# Patient Record
Sex: Female | Born: 1955 | Race: White | Hispanic: No | Marital: Married | Smoking: Never smoker
Health system: Southern US, Community
[De-identification: ages and names within clinical notes are randomized; demographics above are authoritative.]

## PROBLEM LIST (undated history)

## (undated) DIAGNOSIS — F329 Major depressive disorder, single episode, unspecified: Secondary | ICD-10-CM

## (undated) DIAGNOSIS — M722 Plantar fascial fibromatosis: Secondary | ICD-10-CM

## (undated) DIAGNOSIS — D649 Anemia, unspecified: Secondary | ICD-10-CM

## (undated) DIAGNOSIS — F32A Depression, unspecified: Secondary | ICD-10-CM

## (undated) DIAGNOSIS — R112 Nausea with vomiting, unspecified: Secondary | ICD-10-CM

## (undated) DIAGNOSIS — C8590 Non-Hodgkin lymphoma, unspecified, unspecified site: Secondary | ICD-10-CM

## (undated) DIAGNOSIS — K274 Chronic or unspecified peptic ulcer, site unspecified, with hemorrhage: Secondary | ICD-10-CM

## (undated) DIAGNOSIS — A0472 Enterocolitis due to Clostridium difficile, not specified as recurrent: Secondary | ICD-10-CM

## (undated) DIAGNOSIS — Z9889 Other specified postprocedural states: Secondary | ICD-10-CM

## (undated) DIAGNOSIS — C50919 Malignant neoplasm of unspecified site of unspecified female breast: Secondary | ICD-10-CM

## (undated) DIAGNOSIS — T7840XA Allergy, unspecified, initial encounter: Secondary | ICD-10-CM

## (undated) DIAGNOSIS — C50412 Malignant neoplasm of upper-outer quadrant of left female breast: Secondary | ICD-10-CM

## (undated) DIAGNOSIS — K284 Chronic or unspecified gastrojejunal ulcer with hemorrhage: Secondary | ICD-10-CM

## (undated) DIAGNOSIS — N6489 Other specified disorders of breast: Secondary | ICD-10-CM

## (undated) DIAGNOSIS — Z5189 Encounter for other specified aftercare: Secondary | ICD-10-CM

## (undated) DIAGNOSIS — M810 Age-related osteoporosis without current pathological fracture: Secondary | ICD-10-CM

## (undated) DIAGNOSIS — F319 Bipolar disorder, unspecified: Secondary | ICD-10-CM

## (undated) HISTORY — PX: BREAST BIOPSY: SHX20

## (undated) HISTORY — DX: Allergy, unspecified, initial encounter: T78.40XA

## (undated) HISTORY — DX: Enterocolitis due to Clostridium difficile, not specified as recurrent: A04.72

## (undated) HISTORY — DX: Malignant neoplasm of unspecified site of unspecified female breast: C50.919

## (undated) HISTORY — DX: Age-related osteoporosis without current pathological fracture: M81.0

## (undated) HISTORY — DX: Non-Hodgkin lymphoma, unspecified, unspecified site: C85.90

## (undated) HISTORY — DX: Major depressive disorder, single episode, unspecified: F32.9

## (undated) HISTORY — PX: OTHER SURGICAL HISTORY: SHX169

## (undated) HISTORY — DX: Bipolar disorder, unspecified: F31.9

## (undated) HISTORY — DX: Malignant neoplasm of upper-outer quadrant of left female breast: C50.412

## (undated) HISTORY — PX: COLONOSCOPY: SHX174

## (undated) HISTORY — DX: Depression, unspecified: F32.A

## (undated) HISTORY — DX: Chronic or unspecified gastrojejunal ulcer with hemorrhage: K28.4

## (undated) HISTORY — DX: Other specified disorders of breast: N64.89

## (undated) HISTORY — DX: Chronic or unspecified peptic ulcer, site unspecified, with hemorrhage: K27.4

## (undated) HISTORY — PX: UPPER GASTROINTESTINAL ENDOSCOPY: SHX188

## (undated) HISTORY — DX: Encounter for other specified aftercare: Z51.89

---
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
## 2000-02-13 ENCOUNTER — Other Ambulatory Visit: Admission: RE | Admit: 2000-02-13 | Discharge: 2000-02-13 | Payer: Self-pay | Admitting: *Deleted

## 2001-05-04 ENCOUNTER — Other Ambulatory Visit: Admission: RE | Admit: 2001-05-04 | Discharge: 2001-05-04 | Payer: Self-pay | Admitting: *Deleted

## 2004-08-03 ENCOUNTER — Encounter: Admission: RE | Admit: 2004-08-03 | Discharge: 2004-08-03 | Payer: Self-pay | Admitting: *Deleted

## 2004-08-23 ENCOUNTER — Other Ambulatory Visit: Admission: RE | Admit: 2004-08-23 | Discharge: 2004-08-23 | Payer: Self-pay | Admitting: *Deleted

## 2004-10-14 DIAGNOSIS — T7840XA Allergy, unspecified, initial encounter: Secondary | ICD-10-CM

## 2004-10-14 HISTORY — DX: Allergy, unspecified, initial encounter: T78.40XA

## 2006-01-03 ENCOUNTER — Encounter: Admission: RE | Admit: 2006-01-03 | Discharge: 2006-01-03 | Payer: Self-pay | Admitting: Family Medicine

## 2006-01-20 ENCOUNTER — Encounter: Admission: RE | Admit: 2006-01-20 | Discharge: 2006-01-20 | Payer: Self-pay | Admitting: Family Medicine

## 2006-04-13 LAB — HM COLONOSCOPY: HM Colonoscopy: NORMAL

## 2006-06-09 ENCOUNTER — Other Ambulatory Visit: Admission: RE | Admit: 2006-06-09 | Discharge: 2006-06-09 | Payer: Self-pay | Admitting: Obstetrics & Gynecology

## 2007-08-09 ENCOUNTER — Emergency Department (HOSPITAL_COMMUNITY): Admission: EM | Admit: 2007-08-09 | Discharge: 2007-08-09 | Payer: Self-pay | Admitting: Family Medicine

## 2007-11-30 ENCOUNTER — Other Ambulatory Visit: Admission: RE | Admit: 2007-11-30 | Discharge: 2007-11-30 | Payer: Self-pay | Admitting: Obstetrics and Gynecology

## 2009-03-08 ENCOUNTER — Other Ambulatory Visit: Admission: RE | Admit: 2009-03-08 | Discharge: 2009-03-08 | Payer: Self-pay | Admitting: Obstetrics and Gynecology

## 2009-03-17 ENCOUNTER — Encounter: Admission: RE | Admit: 2009-03-17 | Discharge: 2009-03-17 | Payer: Self-pay | Admitting: Obstetrics and Gynecology

## 2009-03-17 LAB — HM DEXA SCAN: HM Dexa Scan: NORMAL

## 2011-06-10 LAB — HM COLONOSCOPY

## 2011-07-24 LAB — POCT URINALYSIS DIP (DEVICE)
Bilirubin Urine: NEGATIVE
Glucose, UA: NEGATIVE
Ketones, ur: NEGATIVE
Nitrite: NEGATIVE
Operator id: 270961
Protein, ur: NEGATIVE
Specific Gravity, Urine: 1.01
Urobilinogen, UA: 0.2
pH: 7.5

## 2011-07-24 LAB — URINE CULTURE: Colony Count: 50000

## 2011-07-24 LAB — POCT PREGNANCY, URINE
Operator id: 270961
Preg Test, Ur: NEGATIVE

## 2011-12-30 ENCOUNTER — Encounter (INDEPENDENT_AMBULATORY_CARE_PROVIDER_SITE_OTHER): Payer: BC Managed Care – PPO | Admitting: Ophthalmology

## 2011-12-30 DIAGNOSIS — H35369 Drusen (degenerative) of macula, unspecified eye: Secondary | ICD-10-CM

## 2011-12-30 DIAGNOSIS — H2510 Age-related nuclear cataract, unspecified eye: Secondary | ICD-10-CM

## 2011-12-30 DIAGNOSIS — H43819 Vitreous degeneration, unspecified eye: Secondary | ICD-10-CM

## 2012-10-14 HISTORY — PX: FOOT SURGERY: SHX648

## 2013-01-20 ENCOUNTER — Ambulatory Visit (INDEPENDENT_AMBULATORY_CARE_PROVIDER_SITE_OTHER): Payer: BC Managed Care – PPO | Admitting: Medical

## 2013-01-20 ENCOUNTER — Encounter: Payer: Self-pay | Admitting: Medical

## 2013-01-20 VITALS — BP 100/80 | HR 69 | Temp 98.5°F | Resp 16 | Wt 134.0 lb

## 2013-01-20 DIAGNOSIS — Z23 Encounter for immunization: Secondary | ICD-10-CM

## 2013-01-20 DIAGNOSIS — S61209A Unspecified open wound of unspecified finger without damage to nail, initial encounter: Secondary | ICD-10-CM

## 2013-01-20 DIAGNOSIS — S61219A Laceration without foreign body of unspecified finger without damage to nail, initial encounter: Secondary | ICD-10-CM

## 2013-01-20 NOTE — Progress Notes (Signed)
Subjective: Here as a new patient today.   Was seeing Dr. Cristopher Peru in [facility] prior.   Has also seen Dr. Lynelle Doctor at [facility] prior.    She is here for injury.  Sunday 3 days ago she was sharpening a clean kitchen knife when she slipped cutting her left 3rd, 4th and 5th fingers across the PIP joint on all 3 dorsally.   The ring finger was deep enough she could see either bone or tendon.  She washed the wound with soap and water.  She has been keeping the wound clean with hydrogen peroxide, rubbing alcohol, and antibacterial ointment.  She did not go for medical care that day.   She thinks her last Td was about 5 years ago but not sure.  She has had some bloody drainage from the left ringer finger, but the other wounds are healing.   She has been using butterfly bandages to help hold the wounds closed. She is right handed.  Objective:  Gen: wd, wn, nad, pleasant white female Skin there are horizontal linear laceration wounds of the 3rd, 4th, and 5th fingers dorsally along the PIP surface, the left ring finger/4th finger dorsally with 1.2cm horizontal linear wound that is open with some mild bloody drainage.  No obvious erythema, warmth, pus.    MSK: ROM of the left hand and fingers is normal, no swelling, no other wound or trauma noted Pulses: normal upper extremity pulses and cap refill Neuro: normal strength and sensation of the all fingers of the left hand and hand itself   Assessment: Encounter Diagnoses  Name Primary?  . Finger laceration, initial encounter Yes  . Need for Tdap vaccination     Plan: I asked Dr. Lynelle Doctor, supervising physician to review the case and examine patient as well.  We both agreed that patient had done a relatively good job of self treating the wounds, but should have went to the ED for sutures the first few hours.   At this point we will have her to use splint of the left 4th finger to help support tendons and soft tissue, keep the finger in  extension while the wound heals through secondary intention.  We applied steri strips to all 3 fingers today, discussed wound care, gave VIS, counseling and tdap vaccines, discussed signs that would prompt return.   Recheck 1wk.

## 2013-02-12 ENCOUNTER — Ambulatory Visit (INDEPENDENT_AMBULATORY_CARE_PROVIDER_SITE_OTHER): Payer: BC Managed Care – PPO | Admitting: Family Medicine

## 2013-02-12 ENCOUNTER — Encounter: Payer: Self-pay | Admitting: Family Medicine

## 2013-02-12 VITALS — BP 110/70 | HR 82

## 2013-02-12 DIAGNOSIS — S6992XA Unspecified injury of left wrist, hand and finger(s), initial encounter: Secondary | ICD-10-CM

## 2013-02-12 DIAGNOSIS — S6990XA Unspecified injury of unspecified wrist, hand and finger(s), initial encounter: Secondary | ICD-10-CM

## 2013-02-12 NOTE — Progress Notes (Signed)
  Subjective:    Patient ID: Rhonda Gardner, female    DOB: 04-Feb-1956, 57 y.o.   MRN: [id number]  HPI She sustained a laceration to her left hand third fourth and fifth fingers over the dorsalsurface of the PIP joints  2-3weeks ago. She treated the wound conservatively.now she is having swelling mainly in the fourth finger of the left hand.   Review of Systems     Objective:   Physical Exam Exam of the fourth finger of left hand does show swelling distal to her wedding ring with slight erythema.       Assessment & Plan:  Finger injury, left, initial encounter an attempt was made to squeeze out the excessive fluid however it was unsuccessful. A ring cutter was employed and the ring was removed without difficulty. She will take it to her jeweler to be fixed. Also encouraged her to watch the swelling which should go down for now. She will call if further trouble

## 2013-02-17 ENCOUNTER — Encounter: Payer: Self-pay | Admitting: *Deleted

## 2013-03-01 ENCOUNTER — Encounter: Payer: Self-pay | Admitting: *Deleted

## 2014-10-10 ENCOUNTER — Ambulatory Visit (INDEPENDENT_AMBULATORY_CARE_PROVIDER_SITE_OTHER): Payer: BC Managed Care – PPO | Admitting: Medical

## 2014-10-10 ENCOUNTER — Telehealth: Payer: Self-pay | Admitting: Family Medicine

## 2014-10-10 ENCOUNTER — Encounter: Payer: Self-pay | Admitting: Family Medicine

## 2014-10-10 ENCOUNTER — Encounter: Payer: Self-pay | Admitting: Medical

## 2014-10-10 VITALS — BP 98/60 | HR 68 | Temp 98.1°F | Resp 15 | Wt 144.0 lb

## 2014-10-10 DIAGNOSIS — F319 Bipolar disorder, unspecified: Secondary | ICD-10-CM

## 2014-10-10 DIAGNOSIS — F3130 Bipolar disorder, current episode depressed, mild or moderate severity, unspecified: Secondary | ICD-10-CM

## 2014-10-10 MED ORDER — LAMICTAL 200 MG PO TABS: 200.0000 mg | ORAL_TABLET | Freq: Every day | ORAL | Status: DC

## 2014-10-10 MED ORDER — LAMOTRIGINE 200 MG PO TABS: 200.0000 mg | ORAL_TABLET | Freq: Every day | ORAL | Status: DC

## 2014-10-10 NOTE — Progress Notes (Signed)
Subjective Here for refill on Lamictal.   Her prescription has run out.  [place] the [place] due to the [place].  Takes this for bipolar.  In the past saw Letta Moynahan.  Has been very stable, has been on this same medication and dose for years.   Last visit there was 06/2013.  Has been taking 100mg  daily, but gets 200mg  and cuts these in half.  Lives at home with husband [name].  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   Her older sister has disabilities.  Exercises regularly.  Just started Zumba class.  Doesn't want to go back to psych office due to costs.  Was seeing yearly.   Has been on Prozac, Neurontin, and other medication prior.   No prior hospitalization.   No prior suicidal attempts.   Has certainly had depression issues in the past.   Has manic episode once before with prozac, but remote past.  Gets along great with husband.    Objective: Gen: wd, wn, nad Psych:pleasant, good eye contact, answers questions appropriately Heart: RRR, normal S1, S2, no murmurs Pulses normal   Assessment: Encounter Diagnosis  Name Primary?  . Bipolar disorder with depression Yes    Plan: We discussed her history, stability, medications.   She sends the script international for huge costs savings . She is taking 100mg  or 1/2 tablet daily although script says 200mg  once daily.  This is how she has done the script prior.   Will request prior records from psychiatry.  Advised she return in the near future for baseline physical and primary care.  Discussed f/u.

## 2014-10-10 NOTE — Telephone Encounter (Signed)
I see her husband.  I believe I have seen her in the past, but that might have been at prior office.  I'm happy to do her CPE, but let's make sure we have records from [place].  (I was surprised to see her on Shane's schedule when I had openings today)

## 2014-10-10 NOTE — Addendum Note (Signed)
Addended by: Armanda Magic on: 10/10/2014 03:40 PM   Modules accepted: Orders

## 2014-10-10 NOTE — Telephone Encounter (Signed)
I closed the note before I sent to you.  Please see prior note

## 2014-10-10 NOTE — Telephone Encounter (Signed)
Was checking pt out and Rhonda Gardner had wanted her to schedule CPE.  She stopped me and said the CPE would be with Dr. Tomi Bamberger since you were her primary care before.  I told her I thought Rhonda Gardner was her PCP.  So I told her I would ask. Please advise.

## 2014-10-11 ENCOUNTER — Telehealth: Payer: Self-pay | Admitting: Family Medicine

## 2014-10-11 NOTE — Telephone Encounter (Signed)
Advised pt that Dr. Tomi Bamberger would take her as a pt and scheduled cpe with her.  Advised her we would need her records before cpe.

## 2014-10-11 NOTE — Telephone Encounter (Signed)
Records have been ordered from 3 different providers as of yesterday.  I will let her know you will accept her as your patient.

## 2014-12-01 ENCOUNTER — Encounter: Payer: Self-pay | Admitting: Family Medicine

## 2014-12-01 ENCOUNTER — Ambulatory Visit (INDEPENDENT_AMBULATORY_CARE_PROVIDER_SITE_OTHER): Payer: BLUE CROSS/BLUE SHIELD | Admitting: Family Medicine

## 2014-12-01 ENCOUNTER — Other Ambulatory Visit: Payer: Self-pay | Admitting: Family Medicine

## 2014-12-01 ENCOUNTER — Other Ambulatory Visit (HOSPITAL_COMMUNITY)
Admission: RE | Admit: 2014-12-01 | Discharge: 2014-12-01 | Disposition: A | Discharge status: Home / Self Care | Payer: BLUE CROSS/BLUE SHIELD | Source: Ambulatory Visit | Attending: Family Medicine | Admitting: Family Medicine

## 2014-12-01 ENCOUNTER — Encounter: Payer: BC Managed Care – PPO | Admitting: Family Medicine

## 2014-12-01 VITALS — BP 102/64 | HR 72 | Ht 68.0 in | Wt 146.0 lb

## 2014-12-01 DIAGNOSIS — Z01419 Encounter for gynecological examination (general) (routine) without abnormal findings: Secondary | ICD-10-CM | POA: Diagnosis not present

## 2014-12-01 DIAGNOSIS — Z1151 Encounter for screening for human papillomavirus (HPV): Secondary | ICD-10-CM | POA: Diagnosis present

## 2014-12-01 DIAGNOSIS — N6320 Unspecified lump in the left breast, unspecified quadrant: Secondary | ICD-10-CM

## 2014-12-01 DIAGNOSIS — Z5181 Encounter for therapeutic drug level monitoring: Secondary | ICD-10-CM

## 2014-12-01 DIAGNOSIS — Z0000 Encounter for general adult medical examination without abnormal findings: Secondary | ICD-10-CM

## 2014-12-01 DIAGNOSIS — Z23 Encounter for immunization: Secondary | ICD-10-CM

## 2014-12-01 DIAGNOSIS — N63 Unspecified lump in breast: Secondary | ICD-10-CM

## 2014-12-01 LAB — POCT URINALYSIS DIPSTICK
Bilirubin, UA: NEGATIVE
Blood, UA: NEGATIVE
Glucose, UA: NEGATIVE
Ketones, UA: NEGATIVE
Leukocytes, UA: NEGATIVE
Nitrite, UA: NEGATIVE
Protein, UA: NEGATIVE
Spec Grav, UA: 1.01
Urobilinogen, UA: NEGATIVE
pH, UA: 6

## 2014-12-01 NOTE — Patient Instructions (Addendum)
  HEALTH MAINTENANCE RECOMMENDATIONS:  It is recommended that you get at least 30 minutes of aerobic exercise at least 5 days/week (for weight loss, you may need as much as 60-90 minutes). This can be any activity that gets your heart rate up. This can be divided in 10-15 minute intervals if needed, but try and build up your endurance at least once a week.  Weight bearing exercise is also recommended twice weekly.  Eat a healthy diet with lots of vegetables, fruits and fiber.  "Colorful" foods have a lot of vitamins (ie green vegetables, tomatoes, red peppers, etc).  Limit sweet tea, regular sodas and alcoholic beverages, all of which has a lot of calories and sugar.  Up to 1 alcoholic drink daily may be beneficial for women (unless trying to lose weight, watch sugars).  Drink a lot of water.  Calcium recommendations are 1200-1500 mg daily (1500 mg for postmenopausal women or women without ovaries), and vitamin D 1000 IU daily.  This should be obtained from diet and/or supplements (vitamins), and calcium should not be taken all at once, but in divided doses.  Monthly self breast exams and yearly mammograms for women over the age of 41 is recommended.  Sunscreen of at least SPF 30 should be used on all sun-exposed parts of the skin when outside between the hours of 10 am and 4 pm (not just when at beach or pool, but even with exercise, golf, tennis, and yard work!)  Use a sunscreen that says "broad spectrum" so it covers both UVA and UVB rays, and make sure to reapply every 1-2 hours.  Remember to change the batteries in your smoke detectors when changing your clock times in the spring and fall.  Use your seat belt every time you are in a car, and please drive safely and not be distracted with cell phones and texting while driving.  We will be scheduling your mammogram for you (due to finding a concerning area on the left breast--you need a diagnostic mammogram rather than screening).  Add  weight-bearing exercise to your exercise routine at least 2x/week. Schedule routine eye exam.

## 2014-12-01 NOTE — Progress Notes (Signed)
Chief Complaint  Patient presents with  . Annual Exam    fasting annual exam with pap. (last had 2oz OJ @ 7:00am) No concerns. Wants to know if she should stay away from NSAIDS due to her mother and aunt and maternal grandfather having wet macular degeneration.    Rhonda Gardner is a 59 y.o. female who presents for a complete physical.  She has the following concerns:  Intermittently feels like there is water in her right ear and would like it checked.  Currently asymptomatic.  Bipolar disorder:  Diagnosed 23 years ago (age 89 or 27).  She has been on lamictal for about 15-20 years, and doing very well.  Moods are well controlled, no side effects.  She has been seeing Letta Moynahan and her Nurse Practitioner, once yearly.  Immunization History  Administered Date(s) Administered  . Influenza Split 07/13/2013  . Tdap 01/20/2013   Last Pap smear: 02/2009  Last mammogram: 03/2009 Last colonoscopy:05/2011(Dr. Lavina Hamman, [facility])--normal ,internal hemorrhoids Last DEXA: 03/2009 normal Dentist: once yearly Ophtho: 2-3 years ago Exercise: Zumba 2x/week, walks with a neighbor 5x/week for 3 miles. Last lipids in prior records were from 2007 (HDL 113!)  Past Medical History  Diagnosis Date  . Allergy 2006    chemical cauterization in spring 2006, and 2nd treatment with good results.(Dr.Shoemaker)  . Bipolar disorder     Past Surgical History  Procedure Laterality Date  . Breast biopsy Bilateral     benign and a right stereotatic breast biopsy.  . Foot surgery Left 2014    Dr. Paulla Dolly    History   Social History  . Marital Status: Married    Spouse Name: N/A  . Number of Children: N/A  . Years of Education: N/A   Occupational History  . Not on file.   Social History Main Topics  . Smoking status: Never Smoker   . Smokeless tobacco: Never Used  . Alcohol Use: No  . Drug Use: No  . Sexual Activity:    Partners: Male     Comment: postmenopausal   Other Topics Concern  .  Not on file   Social History Narrative   Married, 1 dog. Homemaker    Family History  Problem Relation Age of Onset  . Cancer Father     skin  . Heart disease Father   . Cancer Mother     colon cancer at age 31  . Hypertension Mother   . Arthritis Mother     osteoarthritis  . Colon cancer Mother   . Macular degeneration Mother     wet and dry  . Osteoporosis Sister     osteopenia  . Cancer Maternal Aunt     lung and breast  . Diabetes Paternal Aunt   . Autism Sister   . Mental retardation Sister   . Blindness Sister     secondary to retrolental fibroplasia  . Goiter Paternal Aunt     Outpatient Encounter Prescriptions as of 12/01/2014  Medication Sig  . aspirin EC 81 MG tablet Take 81 mg by mouth daily.  Marland Kitchen LAMICTAL 200 MG tablet Take 1 tablet (200 mg total) by mouth daily.  Marland Kitchen ibuprofen (ADVIL,MOTRIN) 200 MG tablet Take 400 mg by mouth every 6 (six) hours as needed.    No Known Allergies  ROS:  The patient denies anorexia, fever, weight changes, headaches,  vision changes, decreased hearing, ear pain, sore throat, breast concerns, chest pain, palpitations, dizziness, syncope, dyspnea on exertion, cough, swelling, nausea, vomiting,  diarrhea, constipation, abdominal pain, melena, hematochezia, indigestion/heartburn, hematuria, incontinence, dysuria, vaginal bleeding, discharge, odor or itch, genital lesions, joint pains, numbness, tingling, weakness, tremor, suspicious skin lesions, depression, anxiety, abnormal bleeding/bruising, or enlarged lymph nodes. Has some vaginal dryness and pain with intercourse.  PHYSICAL EXAM:  BP 102/64 mmHg  Pulse 72  Ht 5\' 8"  (1.727 m)  Wt 146 lb (66.225 kg)  BMI 22.20 kg/m2  General Appearance:    Alert, cooperative, no distress, appears stated age  Head:    Normocephalic, without obvious abnormality, atraumatic  Eyes:    PERRL, conjunctiva/corneas clear, EOM's intact, fundi    benign  Ears:    Normal TM's and external ear canals.  Small amount of dry wax bilaterally, R>L. nonobstructive  Nose:   Nares normal, mucosa normal, no drainage or sinus   tenderness  Throat:   Lips, mucosa, and tongue normal; teeth and gums normal  Neck:   Supple, no lymphadenopathy;  thyroid:  no   enlargement/tenderness/nodules; no carotid   bruit or JVD  Back:    Spine nontender, no curvature, ROM normal, no CVA     tenderness  Lungs:     Clear to auscultation bilaterally without wheezes, rales or     ronchi; respirations unlabored  Chest Wall:    No tenderness or deformity   Heart:    Regular rate and rhythm, S1 and S2 normal, no murmur, rub   or gallop  Breast Exam:    No tenderness, nipple discharge or inversion. No axillary lymphadenopathy. 2cm firm mass at 3 o'clock position left breast.  Diffuse fibroglandular changes on the right. Patient doesn't feel that her breast exam has changed at all.  Abdomen:     Soft, non-tender, nondistended, normoactive bowel sounds,    no masses, no hepatosplenomegaly  Genitalia:    Normal external genitalia without lesions.  BUS and vagina normal; cervix without lesions, or cervical motion tenderness. No abnormal vaginal discharge.  Uterus and adnexa not enlarged, nontender, no masses.  Pap performed; somewhat stenotic cervical os  Rectal:    Normal tone, no masses or tenderness; guaiac negative stool  Extremities:   No clubbing, cyanosis or edema  Pulses:   2+ and symmetric all extremities  Skin:   Skin color, texture, turgor normal, no rashes or lesions  Lymph nodes:   Cervical, supraclavicular, and axillary nodes normal  Neurologic:   CNII-XII intact, normal strength, sensation and gait; reflexes 2+ and symmetric throughout          Psych:   Normal mood, affect, hygiene and grooming.     ASSESSMENT/PLAN:  Annual physical exam - Plan: POCT Urinalysis Dipstick, Visual acuity screening, Lipid panel, Comprehensive metabolic panel, CBC with Differential/Platelet, TSH, Vit D  25 hydroxy (rtn osteoporosis  monitoring), Cytology - PAP [place]  Medication monitoring encounter - Plan: Comprehensive metabolic panel, Vit D  25 hydroxy (rtn osteoporosis monitoring)  Immunization due - Plan: Varicella-zoster vaccine subcutaneous  Left breast mass - Plan: MM Digital Diagnostic Bilat   Discussed monthly self breast exams and yearly mammograms--abnormal finding on today's exam, schedule for diagnostic mammo; at least 30 minutes of aerobic activity at least 5 days/week, weight-bearing exercise at least 2x/week; proper sunscreen use reviewed; healthy diet, including goals of calcium and vitamin D intake and alcohol recommendations (less than or equal to 1 drink/day) reviewed; regular seatbelt use; changing batteries in smoke detectors.  Immunization recommendations discussed--yearly flu shots, zostavax recommended.  Colonoscopy recommendations reviewed--due again 04/2016.  zostavax today.  She  is willing to pay if not covered by her insurance. Counseled re: risks/side effects in detail.  F/u 1 year, sooner prn

## 2014-12-02 LAB — COMPREHENSIVE METABOLIC PANEL
ALT: 24 U/L (ref 0–35)
AST: 28 U/L (ref 0–37)
Albumin: 4.8 g/dL (ref 3.5–5.2)
Alkaline Phosphatase: 65 U/L (ref 39–117)
BUN: 16 mg/dL (ref 6–23)
CO2: 25 mEq/L (ref 19–32)
Calcium: 9.4 mg/dL (ref 8.4–10.5)
Chloride: 101 mEq/L (ref 96–112)
Creat: 0.8 mg/dL (ref 0.50–1.10)
Glucose, Bld: 85 mg/dL (ref 70–99)
Potassium: 3.6 mEq/L (ref 3.5–5.3)
Sodium: 137 mEq/L (ref 135–145)
Total Bilirubin: 0.6 mg/dL (ref 0.2–1.2)
Total Protein: 7 g/dL (ref 6.0–8.3)

## 2014-12-02 LAB — CBC WITH DIFFERENTIAL/PLATELET
Basophils Absolute: 0.1 10*3/uL (ref 0.0–0.1)
Basophils Relative: 1 % (ref 0–1)
Eosinophils Absolute: 0.2 10*3/uL (ref 0.0–0.7)
Eosinophils Relative: 3 % (ref 0–5)
HCT: 40.7 % (ref 36.0–46.0)
Hemoglobin: 13.1 g/dL (ref 12.0–15.0)
Lymphocytes Relative: 24 % (ref 12–46)
Lymphs Abs: 1.3 10*3/uL (ref 0.7–4.0)
MCH: 29.6 pg (ref 26.0–34.0)
MCHC: 32.2 g/dL (ref 30.0–36.0)
MCV: 91.9 fL (ref 78.0–100.0)
MPV: 10.3 fL (ref 8.6–12.4)
Monocytes Absolute: 0.5 10*3/uL (ref 0.1–1.0)
Monocytes Relative: 9 % (ref 3–12)
Neutro Abs: 3.3 10*3/uL (ref 1.7–7.7)
Neutrophils Relative %: 63 % (ref 43–77)
Platelets: 211 10*3/uL (ref 150–400)
RBC: 4.43 MIL/uL (ref 3.87–5.11)
RDW: 14.8 % (ref 11.5–15.5)
WBC: 5.3 10*3/uL (ref 4.0–10.5)

## 2014-12-02 LAB — TSH: TSH: 3.679 u[IU]/mL (ref 0.350–4.500)

## 2014-12-02 LAB — LIPID PANEL
Cholesterol: 199 mg/dL (ref 0–200)
HDL: 119 mg/dL (ref >39)
LDL Cholesterol: 65 mg/dL (ref 0–99)
Total CHOL/HDL Ratio: 1.7 Ratio
Triglycerides: 76 mg/dL (ref <150)
VLDL: 15 mg/dL (ref 0–40)

## 2014-12-02 LAB — VITAMIN D 25 HYDROXY (VIT D DEFICIENCY, FRACTURES): Vit D, 25-Hydroxy: 61 ng/mL (ref 30–100)

## 2014-12-05 ENCOUNTER — Encounter: Payer: Self-pay | Admitting: *Deleted

## 2014-12-05 ENCOUNTER — Encounter: Payer: Self-pay | Admitting: Internal Medicine

## 2014-12-05 LAB — CYTOLOGY - PAP

## 2014-12-07 ENCOUNTER — Telehealth: Payer: Self-pay | Admitting: Internal Medicine

## 2014-12-07 NOTE — Telephone Encounter (Signed)
Received medical records from Dr. Letta Moynahan

## 2014-12-08 ENCOUNTER — Other Ambulatory Visit: Payer: Self-pay

## 2014-12-13 DIAGNOSIS — C50919 Malignant neoplasm of unspecified site of unspecified female breast: Secondary | ICD-10-CM

## 2014-12-13 HISTORY — DX: Malignant neoplasm of unspecified site of unspecified female breast: C50.919

## 2014-12-15 ENCOUNTER — Other Ambulatory Visit: Payer: Self-pay | Admitting: Family Medicine

## 2014-12-15 ENCOUNTER — Ambulatory Visit
Admission: RE | Admit: 2014-12-15 | Discharge: 2014-12-15 | Disposition: A | Discharge status: Home / Self Care | Payer: BLUE CROSS/BLUE SHIELD | Source: Ambulatory Visit | Attending: Family Medicine | Admitting: Family Medicine

## 2014-12-15 DIAGNOSIS — N6320 Unspecified lump in the left breast, unspecified quadrant: Secondary | ICD-10-CM

## 2014-12-19 ENCOUNTER — Ambulatory Visit
Admission: RE | Admit: 2014-12-19 | Discharge: 2014-12-19 | Disposition: A | Discharge status: Home / Self Care | Payer: BLUE CROSS/BLUE SHIELD | Source: Ambulatory Visit | Attending: Family Medicine | Admitting: Family Medicine

## 2014-12-19 DIAGNOSIS — N6320 Unspecified lump in the left breast, unspecified quadrant: Secondary | ICD-10-CM

## 2014-12-21 ENCOUNTER — Encounter: Payer: Self-pay | Admitting: Family Medicine

## 2014-12-21 ENCOUNTER — Other Ambulatory Visit: Payer: Self-pay | Admitting: Family Medicine

## 2014-12-21 DIAGNOSIS — C50912 Malignant neoplasm of unspecified site of left female breast: Secondary | ICD-10-CM

## 2014-12-22 ENCOUNTER — Encounter: Payer: Self-pay | Admitting: *Deleted

## 2014-12-22 ENCOUNTER — Telehealth: Payer: Self-pay | Admitting: *Deleted

## 2014-12-22 ENCOUNTER — Encounter: Payer: Self-pay | Admitting: Family Medicine

## 2014-12-22 DIAGNOSIS — C50412 Malignant neoplasm of upper-outer quadrant of left female breast: Secondary | ICD-10-CM

## 2014-12-22 DIAGNOSIS — Z171 Estrogen receptor negative status [ER-]: Secondary | ICD-10-CM

## 2014-12-22 HISTORY — DX: Malignant neoplasm of upper-outer quadrant of left female breast: C50.412

## 2014-12-22 NOTE — Telephone Encounter (Signed)
Confirmed [facility] for 12/28/14 at 0800 .  Instructions and contact information given.

## 2014-12-27 ENCOUNTER — Ambulatory Visit
Admission: RE | Admit: 2014-12-27 | Discharge: 2014-12-27 | Disposition: A | Discharge status: Home / Self Care | Payer: BLUE CROSS/BLUE SHIELD | Source: Ambulatory Visit | Attending: Family Medicine | Admitting: Family Medicine

## 2014-12-27 DIAGNOSIS — C50912 Malignant neoplasm of unspecified site of left female breast: Secondary | ICD-10-CM

## 2014-12-27 MED ORDER — GADOBENATE DIMEGLUMINE 529 MG/ML IV SOLN
13.0000 mL | Freq: Once | INTRAVENOUS | Status: AC | PRN
  Administered 2014-12-27: 13 mL via INTRAVENOUS

## 2014-12-28 ENCOUNTER — Ambulatory Visit: Payer: Self-pay | Admitting: Surgery

## 2014-12-28 ENCOUNTER — Encounter: Payer: Self-pay | Admitting: Family Medicine

## 2014-12-28 ENCOUNTER — Other Ambulatory Visit: Payer: Self-pay | Admitting: Surgery

## 2014-12-28 ENCOUNTER — Ambulatory Visit (HOSPITAL_BASED_OUTPATIENT_CLINIC_OR_DEPARTMENT_OTHER): Payer: BLUE CROSS/BLUE SHIELD | Admitting: Oncology

## 2014-12-28 ENCOUNTER — Encounter: Payer: Self-pay | Admitting: Physical Therapy

## 2014-12-28 ENCOUNTER — Other Ambulatory Visit (HOSPITAL_BASED_OUTPATIENT_CLINIC_OR_DEPARTMENT_OTHER): Payer: BLUE CROSS/BLUE SHIELD

## 2014-12-28 ENCOUNTER — Encounter: Payer: Self-pay | Admitting: Skilled Nursing Facility1

## 2014-12-28 ENCOUNTER — Encounter: Payer: Self-pay | Admitting: Oncology

## 2014-12-28 ENCOUNTER — Encounter: Payer: Self-pay | Admitting: *Deleted

## 2014-12-28 ENCOUNTER — Ambulatory Visit
Admission: RE | Admit: 2014-12-28 | Discharge: 2014-12-28 | Disposition: A | Discharge status: Home / Self Care | Payer: BLUE CROSS/BLUE SHIELD | Source: Ambulatory Visit | Attending: Radiation Oncology | Admitting: Radiation Oncology

## 2014-12-28 ENCOUNTER — Ambulatory Visit: Payer: BLUE CROSS/BLUE SHIELD

## 2014-12-28 ENCOUNTER — Telehealth: Payer: Self-pay | Admitting: Oncology

## 2014-12-28 ENCOUNTER — Ambulatory Visit: Payer: BLUE CROSS/BLUE SHIELD | Attending: Surgery | Admitting: Physical Therapy

## 2014-12-28 VITALS — BP 119/77 | HR 67 | Temp 97.8°F | Resp 20 | Ht 68.0 in | Wt 143.1 lb

## 2014-12-28 DIAGNOSIS — Z171 Estrogen receptor negative status [ER-]: Secondary | ICD-10-CM

## 2014-12-28 DIAGNOSIS — C50412 Malignant neoplasm of upper-outer quadrant of left female breast: Secondary | ICD-10-CM | POA: Insufficient documentation

## 2014-12-28 DIAGNOSIS — Z801 Family history of malignant neoplasm of trachea, bronchus and lung: Secondary | ICD-10-CM

## 2014-12-28 DIAGNOSIS — C50912 Malignant neoplasm of unspecified site of left female breast: Secondary | ICD-10-CM

## 2014-12-28 DIAGNOSIS — F319 Bipolar disorder, unspecified: Secondary | ICD-10-CM

## 2014-12-28 DIAGNOSIS — Z800 Family history of malignant neoplasm of digestive organs: Secondary | ICD-10-CM

## 2014-12-28 LAB — CBC WITH DIFFERENTIAL/PLATELET
BASO%: 1.3 % (ref 0.0–2.0)
BASOS ABS: 0.1 10*3/uL (ref 0.0–0.1)
EOS%: 3.6 % (ref 0.0–7.0)
Eosinophils Absolute: 0.2 10*3/uL (ref 0.0–0.5)
HCT: 40 % (ref 34.8–46.6)
HEMOGLOBIN: 12.8 g/dL (ref 11.6–15.9)
LYMPH#: 1.1 10*3/uL (ref 0.9–3.3)
LYMPH%: 25.4 % (ref 14.0–49.7)
MCH: 29.5 pg (ref 25.1–34.0)
MCHC: 32.1 g/dL (ref 31.5–36.0)
MCV: 92.1 fL (ref 79.5–101.0)
MONO#: 0.4 10*3/uL (ref 0.1–0.9)
MONO%: 10.5 % (ref 0.0–14.0)
NEUT#: 2.5 10*3/uL (ref 1.5–6.5)
NEUT%: 59.2 % (ref 38.4–76.8)
Platelets: 192 10*3/uL (ref 145–400)
RBC: 4.35 10*6/uL (ref 3.70–5.45)
RDW: 15.9 % — AB (ref 11.2–14.5)
WBC: 4.2 10*3/uL (ref 3.9–10.3)

## 2014-12-28 LAB — COMPREHENSIVE METABOLIC PANEL (CC13)
ALBUMIN: 4.3 g/dL (ref 3.5–5.0)
ALK PHOS: 75 U/L (ref 40–150)
ALT: 36 U/L (ref 0–55)
AST: 43 U/L — AB (ref 5–34)
Anion Gap: 9 mEq/L (ref 3–11)
BUN: 14.8 mg/dL (ref 7.0–26.0)
CHLORIDE: 104 meq/L (ref 98–109)
CO2: 28 mEq/L (ref 22–29)
Calcium: 9.2 mg/dL (ref 8.4–10.4)
Creatinine: 0.8 mg/dL (ref 0.6–1.1)
EGFR: 76 mL/min/{1.73_m2} — ABNORMAL LOW (ref >90)
Glucose: 92 mg/dl (ref 70–140)
POTASSIUM: 4.2 meq/L (ref 3.5–5.1)
Sodium: 141 mEq/L (ref 136–145)
TOTAL PROTEIN: 7 g/dL (ref 6.4–8.3)
Total Bilirubin: 0.41 mg/dL (ref 0.20–1.20)

## 2014-12-28 NOTE — Progress Notes (Signed)
Subjective:     Patient ID: Rhonda Gardner, female   DOB: 04/11/1956, 59 y.o.   MRN: [id number]  HPI   Review of Systems     Objective:   Physical Exam For the patient to understand and be given the tools to implement a healthy plant based diet during their cancer diagnosis.     Assessment:     Patient was seen today and found to be in good spirits and accompanied by her stern faced husband. Patients left breast afflicted. Patients current/re;evant medication: vitamin B12, vitamin D, lutein, and magnesium citrate. Patient states she takes magnesium in order to increase her absorption of calcium, she takes vitamin b12 for her brain health and memory, and she takes lutein because her mother and aunts had macular degeneration. Patient states she does Zumba classes two times a week and walks a few miles 3-4 times a week. Patients AST 43, the patients LDL:HDL ratio is excellent at 65:119. As of 12/01/2014 patients weight 146 pounds and BMI 22.2 and now (12/28/2014) it is 143 pounds and BMI 21.8. Patients husband states she needs to lose weight and Dietitian advised against that at this time as this could interfere with her health.     Plan:     Dietitian educated the patient on implementing a plant based diet by incorporating more plant proteins, fruits, and vegetables. As a part of a healthy routine physical activity was discussed. Dietitian educated the patient on the use of the vitamins/minerals she is taking in the body and how they are absorbed. Dietitian also advised if she is going to take supplements they have the USP label.  A folder of evidence based information with a focus on a plant based diet and general nutrition during cancer was given to the patient.  The importance of legitimate, evidence based information was discussed and examples were given. As a part of the continuum of care the cancer dietitian's contact information was given to the patient in the event they would  like to have a follow up appointment.

## 2014-12-28 NOTE — Progress Notes (Signed)
Rhonda Gardner is a very pleasant 59 y.o. 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  Biopsy results revealed the tumor's hormone status as ER negative, PR negative, and HER2/neu equivocal. Ki67 is 38%.  She presents today with her husband to the [facility]) for treatment consideration and recommendations from the breast surgeon, radiation oncologist, and medical oncologist.     I briefly met with Rhonda Gardner and her husband during her [facility] visit today. We discussed the purpose of the Survivorship Clinic, which will include monitoring for recurrence, coordinating completion of age and gender-appropriate cancer screenings, promotion of overall wellness, as well as managing potential late/long-term side effects of anti-cancer treatments.    The treatment plan for Rhonda Gardner will likely include neoadjuvant chemotherapy, surgery, and radiation therapy.  She will meet with the Genetics Counselor due to her family history of breast cancer. As of today, the intent of treatment for Rhonda Gardner is cure, therefore she will be eligible for the Survivorship Clinic upon her completion of treatment.  Her survivorship care plan (SCP) document will be drafted and updated throughout the course of her treatment trajectory. She will receive the SCP in an office visit with myself in the Survivorship Clinic once she has completed treatment.   Rhonda Gardner was encouraged to ask questions and all questions were answered to her satisfaction.  She was given my business card and encouraged to contact me with any concerns regarding survivorship.  I look forward to participating in her care.   Mike Craze, NP [place] [phone]

## 2014-12-28 NOTE — H&P (Signed)
Rhonda Gardner Rhonda Gardner 12/28/2014 10:20 AM Location: [facility] Patient #: [id number] DOB: 07-30-1956 Undefined / Language: Rhonda Gardner / Race: Undefined Female History of Present Illness Rhonda Gardner A. Terin Dierolf Gardner; 12/28/2014 11:33 AM) Patient words: Pt sent at the request of Dr Pablo Ledger for left breast cancer.  location left upper outer quadrant deep and possible involving pectoralis muscle.     ADDITIONAL INFORMATION: PROGNOSTIC INDICATORS - ACIS Results: IMMUNOHISTOCHEMICAL AND MORPHOMETRIC ANALYSIS BY THE AUTOMATED CELLULAR IMAGING SYSTEM (ACIS) Estrogen Receptor: 0%, NEGATIVE Progesterone Receptor: 0%, NEGATIVE Proliferation Marker Ki67: 38% COMMENT: The negative hormone receptor study(ies) PR in this case has no internal positive control. REFERENCE RANGE ESTROGEN RECEPTOR NEGATIVE <1% POSITIVE =>1% PROGESTERONE RECEPTOR NEGATIVE <1% POSITIVE =>1% All controls stained appropriately Rhonda Gardner Pathologist, Electronic Signature ( Signed 12/23/2014) CHROMOGENIC IN-SITU HYBRIDIZATION Results: HER 2 IS IN THE EQUIVOCAL RANGE. OF NOTE, THE TUMOR APPEARS TO SHOW HETEROGENEITY IN REGARDS TO HER2 EXPRESSION. RESULT RATIO OF HER2: CEP 17 SIGNALS 1.57 AVERAGE HER2 COPY NUMBER PER CELL 4.25 1 of 3 FINAL for Rhonda Gardner [id number]) ADDITIONAL INFORMATION:(continued) REFERENCE RANGE NEGATIVE HER2/Chr17 Ratio <2.0 and Average HER2 copy number <4.0 EQUIVOCAL HER2/Chr17 Ratio <2.0 and Average HER2 copy number 4.0 and <6.0 POSITIVE HER2/Chr17 Ratio >=2.0 and/or Average HER2 copy number >=6.0 Rhonda Gardner Pathologist, Electronic Signature ( Signed 12/22/2014) FINAL DIAGNOSIS Diagnosis Breast, left, needle core biopsy, 2:30 o'clock - INVASIVE DUCTAL CARCINOMA, SEE COMMENT. Microscopic Comment Although the grade of tumor is best assessed at resection, with these biopsies, the invasive tumor is Grade IIIII. The absence of the myoepithelial layer was  confirmed with smooth muscle myosin heavy chain, calponin, and p63 immunostains. Breast prognostic studies are pending and will be reported in an addendum. The case was reviewed with Dr. Avis Epley who concurs. (CRR:ds 12/20/14) Rhonda Gardner Pathologist, Electronic Signature (Case signed 12/21/2014) Specimen Gross and Clinical Information Specimen Comment In formalin 11:00 extracted less than 3 min; abnormal mammo and palpable mass Specimen(s) Obtained: Breast, left, needle core biopsy, 2:30 o'clock Specimen Clinical Information [facility] Gross Received in formalin are 3 cores of soft, tan-red tissue which measure 0.7 x 0.1 x 0.1 cm and up to 1.6 x 0.1 x 0.1 cm. Time in Formalin 11:00 am. Submitted in toto in 1 block(s) Stain(s) used in Diagnosis: The following stain(s) were used in diagnosing the case: P63, PR-ACIS, KI-67-ACIS, CISH, ER-ACIS, Smooth Muscle Myosin - 1 Heavy Chain, Calponin. The control(s) stained appropriately. Disclaimer Her2Neu by CISH (chromogenic in-situ hybridization) is performed at [facility], using the Rayne pharmDx Kit (code number [id number]). This test is used to detect the amplification of the Her2Neu gene in interphase nuclei from formalin fixed, paraffin embedded tissue and is reported using ASCO/CAP scoring criteria published in 2013. Estrogen receptor (SP1), immunohistochemical stains are performed on formalin fixed, paraffin embedded tissue using a 3,3"-diaminobenzidine (DAB) chromogen and DAKO Autostainer System. The staining intensity of the nucleus is scored morphometrically using the Automated Cellular Imaging System (ACIS) and is reported as the percentage of tumor cell nuclei demonstrating specific nuclear staining. 2 of 3 FINAL for Rhonda Gardner Gardner [phone]) Disclaimer(continued) Ki-67 (Mib-1), immunohistochemical stains are performed on formalin fixed, paraffin embedded tissue using a 3,3"-diaminobenzidine (DAB) chromogen and Turner. The staining intensity of the nucleus is scored morphometrically using the Automated Cellular Imaging System (ACIS) and is reported as the percentage of tumor cell nuclei demonstrating specific nuclear staining. PR progesterone receptor (PgR 636), immunohistochemical stains are performed on formalin fixed, paraffin embedded tissue  using a 3,3"-diaminobenzidine (DAB) chromogen and [place]. The staining intensity of the nucleus is scored morphometrically using the Automated Cellular Imaging System (ACIS) and is reported as the percentage of tumor cell nuclei demonstrating specific nuclear staining. Some of these immunohistochemical stains may have been developed and the performance characteristics determined by [facility]. Some may not have been cleared or approved by the U.S. Food and Drug Administration. The FDA has determined that such clearance or approval is not necessary. This test is used for clinical purposes. It should not be regarded as investigational or for research. This laboratory is certified under the [place] (CLIA-88) as qualified to perform high complexity clinical laboratory testing. Report signed out from the following location(s) Technical Component was performed at [facility]. [address],[place] [id number].XLKG:[id number]., Technical component and interpretation was performed at     CLINICAL DATA: Post left breast ultrasound-guided biopsy.  EXAM: DIAGNOSTIC left breast MAMMOGRAM POST ultrasound BIOPSY  COMPARISON: Previous exam(s).  FINDINGS: Mammographic images were obtained following ultrasound guided biopsy of the mass located within the lateral left breast at the 2:30 o'clock position. The coil shaped clip is associated with the posterior portion of the mass on the left MLO view. Despite multiple attempts, the clip could not be  visualized in the CC projection due to the posterior and lateral location of the mass.  IMPRESSION: Appropriate position of clip on left MLO view. The clip and posterior portion of the left breast mass could not be visualized on today's post clip left CC views despite multiple attempts.  Final Assessment: Post Procedure Mammograms for Marker Placement   Electronically Signed By: Rhonda Cabal M.D. On: 12/19/2014 12:20      CLINICAL DATA: Newly diagnosed left breast 2 o'clock location invasive ductal carcinoma. Preoperative MRI.  LABS: Not performed  EXAM: BILATERAL BREAST MRI WITH AND WITHOUT CONTRAST  TECHNIQUE: Multiplanar, multisequence MR images of both breasts were obtained prior to and following the intravenous administration of 13 ml of MultiHance.  THREE-DIMENSIONAL MR IMAGE RENDERING ON INDEPENDENT WORKSTATION:  Three-dimensional MR images were rendered by post-processing of the original MR data on an independent workstation. The three-dimensional MR images were interpreted, and findings are reported in the following complete MRI report for this study. Three dimensional images were evaluated at the independent DynaCad workstation  COMPARISON: Prior mammograms and left breast ultrasound  FINDINGS: Breast composition: c. Heterogeneous fibroglandular tissue.  Background parenchymal enhancement: Minimal  Right breast: No mass or abnormal enhancement.  Left breast: In the left breast 2 o'clock location posterior depth is a bilobed enhancing mass with adjacent clip artifact measuring overall 2.5 x 1.6 x 1.5 cm, demonstrating wash-in type enhancement kinetics. This corresponds to the biopsy-proven malignancy. This directly abuts the subjacent left pectoralis major muscle and superficial involvement is not excluded although there is no enhancement within the left pectoralis major muscle or underlying chest wall structures.  Lymph nodes: No  abnormal appearing lymph nodes.  Ancillary findings: 1 cm T2 hyperintense hepatic cyst incidentally noted at the dome of the right hepatic lobe.  IMPRESSION: 2.5 cm enhancing left breast 2 o'clock location mass compatible with biopsy-proven malignancy. This directly abuts the left pectoralis major muscle and superficial muscle involvement is possible although there is no abnormal intramuscular enhancement or enhancement of the underlying chest wall structures.  No MRI evidence for contralateral or multifocal/ multicentric malignancy.  RECOMMENDATION: Treatment plan  BI-RADS CATEGORY 6: Known biopsy-proven malignancy.   Electronically  Signed By: Rhonda Paris M.D.  The patient is a 59 year old female who presents with breast cancer. 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 Tumor markers include estrogen receptor negative, progesterone receptor negative and HER-2/neu negative. The patient was referred by a specialty consultant. Initial presentation was 1 month(s) ago for an abnormal mammogram. Current diagnosis was determined by mammography, breast MRI, breast ultrasound and core needle biopsy. This problem has not been previously evaluated. This problem has not been previously treated. Other Problems Rhonda Gardner, RMA; 12/28/2014 10:20 AM) Back Pain Breast Cancer Depression General anesthesia - complications Lump In Breast  Past Surgical History Rhonda Gardner, RMA; 12/28/2014 10:20 AM) Breast Biopsy Bilateral. multiple Foot Surgery Left. Oral Surgery  Diagnostic Studies History Rhonda Gardner, [place]; 12/28/2014 10:20 AM) Colonoscopy 1-5 years ago Mammogram within last year Pap Smear 1-5 years ago  Social History Rhonda [place], RMA; 12/28/2014 10:20 AM) Alcohol use Remotely quit alcohol use. Caffeine use Carbonated beverages. No drug use Tobacco use Never smoker.  Family History Rhonda Malta [place], [place];  12/28/2014 10:20 AM) Alcohol Abuse Family Members In General. Arthritis Family Members In General, Mother. Breast Cancer Family Members In General. Cancer Father. Cerebrovascular Accident Family Members In [place] Mother. Diabetes Mellitus Family Members In General. Heart Disease Family Members In General. Hypertension Family Members In General, Mother. Respiratory Condition Family Members In General. Thyroid problems Family Members In General.  Pregnancy / Birth History Rhonda Gardner, [place]; 12/28/2014 10:20 AM) Age at menarche 41 years. Age of menopause 51-55 Contraceptive History Intrauterine device, Oral contraceptives. Gravida 1 Irregular periods Maternal age 80-25 Para 0     Review of Systems Rhonda Gardner RMA; 12/28/2014 10:20 AM) General Not Present- Appetite Loss, Chills, Fatigue, Fever, Night Sweats, Weight Gain and Weight Loss. Skin Not Present- Change in Wart/Mole, Dryness, Hives, Jaundice, New Lesions, Non-Healing Wounds, Rash and Ulcer. HEENT Present- Wears glasses/contact lenses. Not Present- Earache, Hearing Loss, Hoarseness, Nose Bleed, Oral Ulcers, Ringing in the Ears, Seasonal Allergies, Sinus Pain, Sore Throat, Visual Disturbances and Yellow Eyes. Respiratory Present- Snoring. Not Present- Bloody sputum, Chronic Cough, Difficulty Breathing and Wheezing. Breast Present- Breast Mass. Not Present- Breast Pain, Nipple Discharge and Skin Changes. Cardiovascular Not Present- Chest Pain, Difficulty Breathing Lying Down, Leg Cramps, Palpitations, Rapid Heart Rate, Shortness of Breath and Swelling of Extremities. Gastrointestinal Not Present- Abdominal Pain, Bloating, Bloody Stool, Change in Bowel Habits, Chronic diarrhea, Constipation, Difficulty Swallowing, Excessive gas, Gets full quickly at meals, Hemorrhoids, Indigestion, Nausea, Rectal Pain and Vomiting. Female Genitourinary Not Present- Frequency, Nocturia, Painful Urination, Pelvic  Pain and Urgency. Musculoskeletal Not Present- Back Pain, Joint Pain, Joint Stiffness, Muscle Pain, Muscle Weakness and Swelling of Extremities. Neurological Not Present- Decreased Memory, Fainting, Headaches, Numbness, Seizures, Tingling, Tremor, Trouble walking and Weakness. Psychiatric Present- Bipolar. Not Present- Anxiety, Change in Sleep Pattern, Depression, Fearful and Frequent crying. Endocrine Not Present- Cold Intolerance, Excessive Hunger, Hair Changes, Heat Intolerance, Hot flashes and New Diabetes. Hematology Not Present- Easy Bruising, Excessive bleeding, Gland problems, HIV and Persistent Infections.   Physical Exam (Rhonda Gardner; 12/28/2014 11:35 AM)  General Mental Status-Alert. General Appearance-Consistent with stated age. Hydration-Well hydrated. Voice-Normal.  Head and Neck Head-normocephalic, atraumatic with no lesions or palpable masses. Trachea-midline. Thyroid Gland Characteristics - normal size and consistency.  Chest and Lung Exam Chest and lung exam reveals -quiet, even and easy respiratory effort with no use of accessory muscles and on auscultation, normal breath sounds, no adventitious  sounds and normal vocal resonance. Inspection Chest Wall - Normal. Back - normal.  Breast Note: 2 cm mobile left breast mass in Tail of spence not fixed. right breast is normal   Cardiovascular Cardiovascular examination reveals -normal heart sounds, regular rate and rhythm with no murmurs and normal pedal pulses bilaterally.  Neurologic Neurologic evaluation reveals -alert and oriented x 3 with no impairment of recent or remote memory. Mental Status-Normal.  Musculoskeletal Normal Exam - Left-Upper Extremity Strength Normal and Lower Extremity Strength Normal. Normal Exam - Right-Upper Extremity Strength Normal and Lower Extremity Strength Normal.  Lymphatic Axillary  General Axillary Region: Bilateral - Description - Normal.  Tenderness - Non Tender.    Assessment & Plan (Rhonda Standiford A. 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 Discussed neoadjuvant vs lumpectomy up front. Given triple negative state, she may benefit from chemotherapy first and resection second. She would like to conserve her breast. Will set up for port placement. Risk of bleding, infection, collapsed or punctured lung, internal bleeding, and catheter migration or fragmentation, need for other surgeries. She agrees to proceed.  Current Plans Pt Education - [facility]

## 2014-12-28 NOTE — Progress Notes (Signed)
[place]  [phone] Fax:([phone]     ID: Rhonda Gardner DOB: 1956-02-23  MR#: [id number]  AQT#:[id number]  Patient Care Team: Rita Ohara, MD as PCP - General (Family Medicine) Erroll Luna, MD as Consulting Physician (General Surgery) Chauncey Cruel, MD as Consulting Physician (Oncology) Thea Silversmith, MD as Consulting Physician (Radiation Oncology) Mauro Kaufmann, RN as Registered Nurse Rockwell Germany, RN as Registered Nurse Holley Bouche, NP as Nurse Practitioner (Nurse Practitioner) PCP: Vikki Ports, MD OTHER MD:  CHIEF COMPLAINT: Triple negative breast cancer  CURRENT TREATMENT: Awaiting neoadjuvant chemotherapy   BREAST CANCER HISTORY: "Rhonda Gardner" had not had a physical exam in quite some time, and had not had a mammogram since 2020, when she was evaluated by Dr. Tomi Bamberger 12/01/2014. Dr. Tomi Bamberger was able to palpate a 2 cm firm mass at the 3:00 position in the left breast. The patient herself has been aware of multiple breast masses and does have a history of fibrocystic change, with multiple prior benign biopsies. She did not feel this particular mass had changed recently.   Dr. Tomi Bamberger arranged for bilateral diagnostic mammography and left breast ultrasonography at the breast Center 12/15/2014. This shows the breast density to be category C. In the left breast upper outer quadrant there was a bilobed mass measuring approximately 2 cm. This was palpable. Ultrasound confirmed an irregular microlobulated and hypoechoic mass measuring 2.1 cm at the 2:00 location 6 cm from the nipple. There was no left axillary adenopathy.  Biopsy of the mass in question 12/19/2014 showed ([id number]) invasive ductal carcinoma, grade 2 or 3, estrogen and progesterone receptor negative, with an MIB-1 of 38%, and equivocal HER-2 amplification, the signals ratio being 1.57, the number per cell 4.25.  On 12/27/2014 the patient underwent bilateral breast MRI.  The mass in question at the 2:00 location of the left breast measured 2.5 cm. It directly abuts the left pectoralis major. There is no enhancement in the muscle itself. There were no abnormal appearing lymph nodes. There was an incidentally noted 1 cm hepatic cyst at the dome of the right liver lobe.  Her subsequent history is as detailed below  INTERVAL HISTORY: Rhonda Gardner was evaluated at the multidisciplinary breast cancer clinic 12/28/2014 accompanied by her husband Rhonda Gardner. Her case was also presented at the multidisciplinary breast cancer conference that same morning. It was felt that neoadjuvant chemotherapy would be helpful to improve the results of breast conserving surgery. Genetics referral was also recommended.  REVIEW OF SYSTEMS: There were no specific symptoms leading to the original mammogram, which was routinely scheduled. The patient denies unusual headaches, visual changes, nausea, vomiting, stiff neck, dizziness, or gait imbalance. There has been no cough, phlegm production, or pleurisy, no chest pain or pressure, and no change in bowel or bladder habits. The patient denies fever, rash, bleeding, unexplained fatigue or unexplained weight loss. A detailed review of systems was otherwise entirely negative.  PAST MEDICAL HISTORY: Past Medical History  Diagnosis Date  . Allergy 2006    chemical cauterization in spring 2006, and 2nd treatment with good results.(Dr.Shoemaker)  . Bipolar disorder   . Breast cancer 12/2014    invasive ductal carcinoma (LEFT)  . Breast cancer of upper-outer quadrant of left female breast 12/22/2014    PAST SURGICAL HISTORY: Past Surgical History  Procedure Laterality Date  . Breast biopsy Bilateral     benign and a right stereotatic breast biopsy.  . Foot surgery Left 2014    Dr. Paulla Dolly  .  Fibroid excision  age 33    prolapsed fibroid removed    FAMILY HISTORY Family History  Problem Relation Age of Onset  . Cancer Father     skin  . Heart disease  Father   . Hypertension Mother   . Arthritis Mother     osteoarthritis  . Colon cancer Mother   . Macular degeneration Mother     wet and dry  . Cancer Mother     colon cancer at age 10  . Osteoporosis Sister     osteopenia  . Cancer Maternal Aunt     lung metastasized from breast  . Diabetes Paternal Aunt   . Autism Sister   . Mental retardation Sister   . Blindness Sister     secondary to retrolental fibroplasia  . Goiter Paternal Aunt    the patient's father lived to be 95. He had a history of nonmelanoma skin cancers. [name] mother is living at age 31. She was diagnosed with colon cancer in her 70s. A paternal aunt may have had stomach cancer. Maternal aunt developed lung cancer in her 14s. The patient has one brother, 2 sisters. One of her sisters is blind and mentally retarded. There is no history of breast or ovarian cancer in the family.  GYNECOLOGIC HISTORY:  No LMP recorded. Menarche age 25. The patient is GX P0. She stopped having periods in her early 66s. She did not take hormone replacement. She took oral contraceptives remotely for over 5 years with no complications.  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 Her husband Rhonda Gardner worked in IT originally but now runs a family firm. It's just the 2 of them at home, plus a chocolate lab.    ADVANCED DIRECTIVES: In place   HEALTH MAINTENANCE: History  Substance Use Topics  . Smoking status: Never Smoker   . Smokeless tobacco: Never Used  . Alcohol Use: No     Colonoscopy: In [place] under Dr. Lavina Hamman, date not entirely clear  PAP: February 2016  Bone density: Under S[facility] Radon, results not currently available  Lipid panel:  No Known Allergies  Current Outpatient Prescriptions  Medication Sig Dispense Refill  . aspirin EC 81 MG tablet Take 81 mg by mouth daily.    . Cholecalciferol (VITAMIN D3) 2000 UNITS TABS Take by mouth.    . Cyanocobalamin (VITAMIN B12 PO) Take 5,000 mcg by mouth  daily.    Marland Kitchen ibuprofen (ADVIL,MOTRIN) 200 MG tablet Take 400 mg by mouth every 6 (six) hours as needed.    Marland Kitchen MAGNESIUM CITRATE PO Take 250 mg by mouth daily.    . Multiple Vitamins-Minerals (PRESERVISION AREDS 2 PO) Take by mouth.    . multivitamin-lutein (OCUVITE-LUTEIN) CAPS capsule Take 1 capsule by mouth daily.    Marland Kitchen LAMICTAL 200 MG tablet Take 1 tablet (200 mg total) by mouth daily. 90 tablet 0   No current facility-administered medications for this visit.    OBJECTIVE: Middle-aged white woman who appears younger than stated age 41 Vitals:   12/28/14 0831  BP: 119/77  Pulse: 67  Temp: 97.8 F (36.6 C)  Resp: 20     Body mass index is 21.76 kg/(m^2).    ECOG FS:0 - Asymptomatic  Ocular: Sclerae unicteric, pupils equal, round and reactive to light Ear-nose-throat: Oropharynx clear, dentition in excellent repair Lymphatic: No cervical or supraclavicular adenopathy Lungs no rales or rhonchi, good excursion bilaterally Heart regular rate and rhythm, no murmur appreciated Abd soft, nontender, positive bowel  sounds MSK no focal spinal tenderness, no joint edema Neuro: non-focal, well-oriented, appropriate affect Breasts: The right breast is unremarkable. In the left breast upper outer quadrant there is an easily palpable mass associated with a small ecchymosis from the recent biopsy. The mass measured between 2 and 3 cm by palpation. There are no other skin or nipple changes of concern. The left axilla was benign.   LAB RESULTS:  CMP     Component Value Date/Time   NA 141 12/28/2014 0816   NA 137 12/01/2014 1437   K 4.2 12/28/2014 0816   K 3.6 12/01/2014 1437   CL 101 12/01/2014 1437   CO2 28 12/28/2014 0816   CO2 25 12/01/2014 1437   GLUCOSE 92 12/28/2014 0816   GLUCOSE 85 12/01/2014 1437   BUN 14.8 12/28/2014 0816   BUN 16 12/01/2014 1437   CREATININE 0.8 12/28/2014 0816   CREATININE 0.80 12/01/2014 1437   CALCIUM 9.2 12/28/2014 0816   CALCIUM 9.4 12/01/2014 1437    PROT 7.0 12/28/2014 0816   PROT 7.0 12/01/2014 1437   ALBUMIN 4.3 12/28/2014 0816   ALBUMIN 4.8 12/01/2014 1437   AST 43* 12/28/2014 0816   AST 28 12/01/2014 1437   ALT 36 12/28/2014 0816   ALT 24 12/01/2014 1437   ALKPHOS 75 12/28/2014 0816   ALKPHOS 65 12/01/2014 1437   BILITOT 0.41 12/28/2014 0816   BILITOT 0.6 12/01/2014 1437    INo results found for: SPEP, UPEP  Lab Results  Component Value Date   WBC 4.2 12/28/2014   NEUTROABS 2.5 12/28/2014   HGB 12.8 12/28/2014   HCT 40.0 12/28/2014   MCV 92.1 12/28/2014   PLT 192 12/28/2014      Chemistry      Component Value Date/Time   NA 141 12/28/2014 0816   NA 137 12/01/2014 1437   K 4.2 12/28/2014 0816   K 3.6 12/01/2014 1437   CL 101 12/01/2014 1437   CO2 28 12/28/2014 0816   CO2 25 12/01/2014 1437   BUN 14.8 12/28/2014 0816   BUN 16 12/01/2014 1437   CREATININE 0.8 12/28/2014 0816   CREATININE 0.80 12/01/2014 1437      Component Value Date/Time   CALCIUM 9.2 12/28/2014 0816   CALCIUM 9.4 12/01/2014 1437   ALKPHOS 75 12/28/2014 0816   ALKPHOS 65 12/01/2014 1437   AST 43* 12/28/2014 0816   AST 28 12/01/2014 1437   ALT 36 12/28/2014 0816   ALT 24 12/01/2014 1437   BILITOT 0.41 12/28/2014 0816   BILITOT 0.6 12/01/2014 1437       No results found for: LABCA2  No components found for: [id number]  No results for input(s): INR in the last 168 hours.  Urinalysis    Component Value Date/Time   LABSPEC 1.010 08/09/2007 2121   PHURINE 7.5 08/09/2007 2121   GLUCOSEU NEGATIVE 08/09/2007 2121   HGBUR SMALL* 08/09/2007 2121   BILIRUBINUR neg 12/01/2014 1358   BILIRUBINUR NEGATIVE 08/09/2007 2121   KETONESUR NEGATIVE 08/09/2007 2121   PROTEINUR neg 12/01/2014 1358   PROTEINUR NEGATIVE 08/09/2007 2121   UROBILINOGEN negative 12/01/2014 1358   UROBILINOGEN 0.2 08/09/2007 2121   NITRITE neg 12/01/2014 1358   NITRITE NEGATIVE 08/09/2007 2121   LEUKOCYTESUR Negative 12/01/2014 1358    STUDIES: Mr Breast  Bilateral W Wo Contrast  12/27/2014   CLINICAL DATA:  Newly diagnosed left breast 2 o'clock location invasive ductal carcinoma. Preoperative MRI.  LABS:  Not performed  EXAM: BILATERAL BREAST MRI WITH AND WITHOUT  CONTRAST  TECHNIQUE: Multiplanar, multisequence MR images of both breasts were obtained prior to and following the intravenous administration of 13 ml of MultiHance.  THREE-DIMENSIONAL MR IMAGE RENDERING ON INDEPENDENT WORKSTATION:  Three-dimensional MR images were rendered by post-processing of the original MR data on an independent workstation. The three-dimensional MR images were interpreted, and findings are reported in the following complete MRI report for this study. Three dimensional images were evaluated at the independent DynaCad workstation  COMPARISON:  Prior mammograms and left breast ultrasound  FINDINGS: Breast composition: c.  Heterogeneous fibroglandular tissue.  Background parenchymal enhancement: Minimal  Right breast: No mass or abnormal enhancement.  Left breast: In the left breast 2 o'clock location posterior depth is a bilobed enhancing mass with adjacent clip artifact measuring overall 2.5 x 1.6 x 1.5 cm, demonstrating wash-in type enhancement kinetics. This corresponds to the biopsy-proven malignancy. This directly abuts the subjacent left pectoralis major muscle and superficial involvement is not excluded although there is no enhancement within the left pectoralis major muscle or underlying chest wall structures.  Lymph nodes: No abnormal appearing lymph nodes.  Ancillary findings: 1 cm T2 hyperintense hepatic cyst incidentally noted at the dome of the right hepatic lobe.  IMPRESSION: 2.5 cm enhancing left breast 2 o'clock location mass compatible with biopsy-proven malignancy. This directly abuts the left pectoralis major muscle and superficial muscle involvement is possible although there is no abnormal intramuscular enhancement or enhancement of the underlying chest wall  structures.  No MRI evidence for contralateral or multifocal/ multicentric malignancy.  RECOMMENDATION: Treatment plan  BI-RADS CATEGORY  6: Known biopsy-proven malignancy.   Electronically Signed   By: Conchita Paris M.D.   On: 12/27/2014 11:59   Mm Digital Diagnostic Unilat L  12/19/2014   CLINICAL DATA:  Post left breast ultrasound-guided biopsy.  EXAM: DIAGNOSTIC left breast MAMMOGRAM POST ultrasound BIOPSY  COMPARISON:  Previous exam(s).  FINDINGS: Mammographic images were obtained following ultrasound guided biopsy of the mass located within the lateral left breast at the 2:30 o'clock position. The coil shaped clip is associated with the posterior portion of the mass on the left MLO view. Despite multiple attempts, the clip could not be visualized in the CC projection due to the posterior and lateral location of the mass.  IMPRESSION: Appropriate position of clip on left MLO view. The clip and posterior portion of the left breast mass could not be visualized on today's post clip left CC views despite multiple attempts.  Final Assessment: Post Procedure Mammograms for Marker Placement   Electronically Signed   By: Altamese Cabal M.D.   On: 12/19/2014 12:20   US Breast Ltd Uni Left Inc Axilla  12/15/2014   CLINICAL DATA:  Palpable left upper outer quadrant breast mass per patient.  EXAM: DIGITAL DIAGNOSTIC bilateral MAMMOGRAM WITH 3D TOMOSYNTHESIS WITH CAD  ULTRASOUND left BREAST  COMPARISON:  The most recent comparison exam is dated 03/17/2009  ACR Breast Density Category c: The breast tissue is heterogeneously dense, which may obscure small masses.  FINDINGS: A bilobed mass is identified in the left breast upper outer quadrant corresponding to the questioned palpable finding measuring approximately 2 cm. No abnormality is identified elsewhere in either breast.  Mammographic images were processed with CAD.  On physical exam, I palpate a firm mass in the left breast upper outer quadrant.  Targeted  ultrasound is performed, showing an irregular microlobulated hypoechoic mass left breast 2 o'clock location 6 cm from the nipple measuring 2.1 x 1.9 x 1.3 cm. This  corresponds to the palpable and mammographic finding. No left axillary lymphadenopathy.  IMPRESSION: Suspicious left breast mass 2 o'clock location. Ultrasound-guided core biopsy will be scheduled at the patient's convenience.  RECOMMENDATION: Left ultrasound-guided core biopsy  I have discussed the findings and recommendations with the patient. Results were also provided in writing at the conclusion of the visit. If applicable, a reminder letter will be sent to the patient regarding the next appointment.  BI-RADS CATEGORY  4: Suspicious.   Electronically Signed   By: Conchita Paris M.D.   On: 12/15/2014 11:16   Mm Diag Breast Tomo Bilateral  12/15/2014   CLINICAL DATA:  Palpable left upper outer quadrant breast mass per patient.  EXAM: DIGITAL DIAGNOSTIC bilateral MAMMOGRAM WITH 3D TOMOSYNTHESIS WITH CAD  ULTRASOUND left BREAST  COMPARISON:  The most recent comparison exam is dated 03/17/2009  ACR Breast Density Category c: The breast tissue is heterogeneously dense, which may obscure small masses.  FINDINGS: A bilobed mass is identified in the left breast upper outer quadrant corresponding to the questioned palpable finding measuring approximately 2 cm. No abnormality is identified elsewhere in either breast.  Mammographic images were processed with CAD.  On physical exam, I palpate a firm mass in the left breast upper outer quadrant.  Targeted ultrasound is performed, showing an irregular microlobulated hypoechoic mass left breast 2 o'clock location 6 cm from the nipple measuring 2.1 x 1.9 x 1.3 cm. This corresponds to the palpable and mammographic finding. No left axillary lymphadenopathy.  IMPRESSION: Suspicious left breast mass 2 o'clock location. Ultrasound-guided core biopsy will be scheduled at the patient's convenience.  RECOMMENDATION: Left  ultrasound-guided core biopsy  I have discussed the findings and recommendations with the patient. Results were also provided in writing at the conclusion of the visit. If applicable, a reminder letter will be sent to the patient regarding the next appointment.  BI-RADS CATEGORY  4: Suspicious.   Electronically Signed   By: Conchita Paris M.D.   On: 12/15/2014 11:16   [place] Lt Breast Bx W Loc Dev 1st Lesion Img Bx Spec US Guide  12/21/2014   ADDENDUM REPORT: 12/21/2014 12:53  ADDENDUM: PATHOLOGY ADDENDUM:  Pathology: Invasive ductal carcinoma.  Pathology concordance with imaging findings: Yes  Recommendation: Further evaluation with MRI is recommended. The patient has been scheduled for an MRI on 12/27/2014 at 10:15 a.m. Surgical consultation is also recommended. The patient is scheduled to be seen in Multidisciplinary Clinic on 12/28/2014.  At the request of the patient, I spoke with her by telephone on 12/21/2014 at 12:50. She reports doing well after the biopsy other than tenderness at the biopsy site.   Electronically Signed   By: Pamelia Hoit M.D.   On: 12/21/2014 12:53   12/21/2014   CLINICAL DATA:  Left breast mass.  EXAM: ULTRASOUND GUIDED left BREAST CORE NEEDLE BIOPSY  COMPARISON:  Previous exam(s).  FINDINGS: I met with the patient and we discussed the procedure of ultrasound-guided biopsy, including benefits and alternatives. We discussed the high likelihood of a successful procedure. We discussed the risks of the procedure, including infection, bleeding, tissue injury, clip migration, and inadequate sampling. Informed written consent was given. The usual time-out protocol was performed immediately prior to the procedure.  Using sterile technique and 2% Lidocaine as local anesthetic, under direct ultrasound visualization, a 14 gauge spring-loaded device was used to perform biopsy of the mass located within the left breast at the 2:30 o'clock position using a lateral approach. At the conclusion of the  procedure a coil shaped tissue marker clip was deployed into the biopsy cavity. Follow up 2 view mammogram was performed and dictated separately.  IMPRESSION: Ultrasound guided biopsy of left breast mass located the 2:30 o'clock position as discussed above. No apparent complications.  Electronically Signed: By: Altamese Cabal M.D. On: 12/19/2014 12:18    ASSESSMENT: 59 y.o. [place] woman s/p left breast upper outer quadrant biopsy 12/19/2014 for a clinical T2 N0, stage IIA invasive ductal carcinoma, grade 2 or 3, estrogen and progesterone receptor negative, HER-2 equivocal, with an MIB-1 of 38%  (1) neoadjuvant chemotherapy to consist of cyclophosphamide and doxorubicin in dose dense fashion 4 followed by paclitaxel and carboplatin weekly 12  (2) definitive surgery to follow chemotherapy  (3) radiation to follow surgery  (4) genetics testing pending  PLAN: We spent the better part of today's hour-long appointment discussing the biology of breast cancer in general, and the specifics of the patient's tumor in particular. Rhonda Gardner understands the difference between local and systemic therapy and in particular the fact that from a systemic therapy point of view her only choice is chemotherapy.  We are requesting additional review of her biopsy by [facility] for HER-2 assessment, but I expect that to be negative, and we are treating this as a triple negative tumor. Accordingly she will qualify for genetics testing.  Rhonda Gardner understands that as far as survival is concerned receiving chemotherapy before surgery or after does not affect overall survival. The advantage of neoadjuvant chemotherapy in her case is first of all to shrink the mass away from the pectoralis muscle to improve margins and in general to make the surgery easier. Of course the goal is to obtain a complete pathologic response.  Today we discussed the possible toxicities, side effects and complications of chemotherapy in a general way.  Rhonda Gardner will need an echocardiogram and port placement. She will also come to "chemotherapy school". She will then see me a few days before the start of chemotherapy, tentatively scheduled for 01/16/2015.  The patient has a good understanding of the overall plan. She agrees with it. She knows the goal of treatment in her case is cure. She will call with any problems that may develop before her next visit here.  Chauncey Cruel, MD   12/28/2014 2:09 PM Medical Oncology and Hematology [facility] [address] [place], [place] [address] Tel. [phone]    Fax. [phone]

## 2014-12-28 NOTE — Progress Notes (Signed)
  Radiation Oncology         [phone]) [phone] ________________________________  Initial outpatient Consultation - Date: 12/28/2014   Name: Taija Mathias Gardner MRN: [id number]   DOB: Sep 05, 1956  REFERRING PHYSICIAN: Erroll Luna, MD  DIAGNOSIS: T2N0 Invasive ductal carcinoma of the left breast  STAGE: No matching staging information was found for the patient.  HISTORY OF PRESENT ILLNESS::Rhonda Gardner is a 59 y.o. female  Who palpated a left breast mass. This was seen on mammogram in the upper outer quadrant. It measured 2.1 x 1.9 x 1.3 cm on ultrasound. Biopsy revealed an invasive ductal carcinoma which was ER-PR-Her2 equivocal. Ki67 was 38%. MRI showed the mass to be slightly larger measuring 2.5 x 1.6 x 1.5 cm abutting the pectoralis muscle with no definitive invasion. She had a mother with colon cancer at 14 and a maternal aunt with lung. She is G1P0. She is post menopausal and accompanied by her husband. She is post menopausal and never took hormone replacement.   PREVIOUS RADIATION THERAPY: No  Past medical, social and family history were reviewed in the electronic chart. Review of symptoms was reviewed in the electronic chart. Medications were reviewed in the electronic chart.   PHYSICAL EXAM: There were no vitals filed for this visit.. . Pleasant female. Appears her stated age. Palpable left breast mass at outer lower quadrant with associated bruising. No palpable abnormalities of the right breast. No palpable abnormalities of the supraclavicular or axillary adenopathy.   IMPRESSION: T2N0 Invasive Ductal Carcinoma of the left breast  PLAN:. We discussed the role of radiation and decreasing local failures in patients who undergo lumpectomy. We discussed that radiation would be recommended regardless of her response to chemotherapy. We discussed the retrospective data showing an increase in failure rates in patients who have a pathologic complete response and did not undergo  radiation. For this reason I have recommended radiation to the whole breast followed by boost to the tumor bed. We discussed the process of simulation the placement tattoos. We discussed possible side effects during treatment including but not limited to skin irritation darkness and fatigue. We discussed long-term effects of treatment which are extremely unlikely but possible including damage to the lungs and ribs.   We discussed the use of breath hold technique for cardiac sparing.   She will undergo neoadjuvant chemotherapy and I will see her after surgery.   She met with social work, our Industrial/product designer, physical therapy and our dietician.   I spent 40 minutes  face to face with the patient and more than 50% of that time was spent in counseling and/or coordination of care.   ------------------------------------------------  Thea Silversmith, MD

## 2014-12-28 NOTE — Progress Notes (Signed)
Clinical Social Work [place] Psychosocial Distress Screening [place]  Patient completed distress screening protocol and scored a 2 on the Psychosocial Distress Thermometer which indicates mild distress. Clinical Social Worker met with patient and patients family in [facility] to assess for distress and other psychosocial needs. Patient stated she was doing "well" and felt comfortable in her treatment plan and treatment team. CSW and patient discussed common feeling and emotions when being diagnosed with cancer. CSW and patient discussed the importance of support during treatment, and CSW informed patient of the support team and support services at [facility]. Patient stated she had a strong support system in her family and friends. CSW provided contact information and encouraged patient to call with any questions or concerns.   ONCBCN DISTRESS SCREENING 12/28/2014  Screening Type Initial Screening  Distress experienced in past week (1-10) 2  Family Problem type Other (comment)  Physician notified of physical symptoms Yes  Referral to clinical psychology No  Referral to clinical social work No  Referral to dietition No  Referral to financial advocate No  Referral to support programs No  Referral to palliative care No   Johnnye Lana, MSW, LCSW, OSW-C Clinical Social Worker [place] [phone]

## 2014-12-28 NOTE — Telephone Encounter (Signed)
Spoke with patient as she did not stop by scheduling and get the avs i printed for her.  She is aware of her appointments and aware that i will schedule her echo 9;00 on 3/23

## 2014-12-28 NOTE — Progress Notes (Signed)
Checked in new pt with no financial concerns prior to seeing the dr.  Informed pt if chemo is part of her treatment Raquel will call her ins to see if auth is req and will obtain it if it is as well as contact foundations that offer copay assistance for chemo if needed.  She has Raquel's card for any billing questions or concerns. °

## 2014-12-28 NOTE — Patient Instructions (Signed)

## 2014-12-28 NOTE — Therapy (Signed)
[place], [place], [address] Phone: [phone]   Fax:  [phone]  Physical Therapy Evaluation  Patient Details  Name: Rhonda Gardner MRN: [id number] Date of Birth: 1956/06/13 Referring Provider:  Erroll Luna, MD  Encounter Date: 12/28/2014      PT End of Session - 12/28/14 0926    Visit Number 1   Number of Visits 1   PT Start Time 0910   PT Stop Time 0938   PT Time Calculation (min) 28 min   Activity Tolerance Patient tolerated treatment well   Behavior During Therapy [facility] for tasks assessed/performed      Past Medical History  Diagnosis Date  . Allergy 2006    chemical cauterization in spring 2006, and 2nd treatment with good results.(Dr.Shoemaker)  . Bipolar disorder   . Breast cancer 12/2014    invasive ductal carcinoma (LEFT)  . Breast cancer of upper-outer quadrant of left female breast 12/22/2014    Past Surgical History  Procedure Laterality Date  . Breast biopsy Bilateral     benign and a right stereotatic breast biopsy.  . Foot surgery Left 2014    Dr. Paulla Dolly  . Fibroid excision  age 59    prolapsed fibroid removed    There were no vitals filed for this visit.  Visit Diagnosis:  Carcinoma of upper-outer quadrant of left female breast - Plan: PT plan of care cert/re-cert      Subjective Assessment - 12/28/14 0923    Symptoms Patient is being seen today for a baseline assessment of her new left breast cancer.   Pertinent History Patient diagnosed 12/21/14 with left ER/PR negative, HER2 equivalent breast cancer measuring 2.1 cm.   Patient Stated Goals Reduce lymphedema risk and learn post op shoulder ROM HEP   Currently in Pain? No/denies            [facility] PT Assessment - 12/28/14 0001    Assessment   Medical Diagnosis Left breast cancer   Onset Date 12/21/14   Precautions   Precautions Other (comment)  Active breast cancer; bipolar disorder   Restrictions   Weight Bearing Restrictions No   Balance Screen   Has the patient fallen in the past 6 months No   Has the patient had a decrease in activity level because of a fear of falling?  No   Is the patient reluctant to leave their home because of a fear of falling?  No   Home Environment   Living Enviornment Private residence   Living Arrangements Spouse/significant other   Available Help at Discharge Family   Prior Function   Level of Independence Independent with basic ADLs   Vocation Volunteer work   Leisure [employer] 4 miles 3-4x/wk and does Zumba 2x/wk   Cognition   Overall Cognitive Status Within Functional Limits for tasks assessed   Posture/Postural Control   Posture/Postural Control No significant limitations   ROM / Strength   AROM / PROM / Strength AROM;Strength   AROM   AROM Assessment Site Shoulder   Right/Left Shoulder Right;Left   Right Shoulder Extension 61 Degrees   Right Shoulder Flexion 147 Degrees   Right Shoulder ABduction 165 Degrees   Right Shoulder Internal Rotation 67 Degrees   Right Shoulder External Rotation 81 Degrees   Left Shoulder Extension 66 Degrees   Left Shoulder Flexion 151 Degrees   Left Shoulder ABduction 167 Degrees   Left Shoulder Internal Rotation 71 Degrees   Left Shoulder External Rotation  85 Degrees   Strength   Overall Strength Within functional limits for tasks performed           LYMPHEDEMA/ONCOLOGY QUESTIONNAIRE - 12/28/14 0925    Type   Cancer Type Left breast   Lymphedema Assessments   Lymphedema Assessments Upper extremities   Right Upper Extremity Lymphedema   10 cm Proximal to Olecranon Process 25 cm   Olecranon Process 23.3 cm   10 cm Proximal to Ulnar Styloid Process 20.7 cm   Just Proximal to Ulnar Styloid Process 15.2 cm   Across Hand at [employer] 19.5 cm   At [place] of 2nd Digit 6.4 cm   Left Upper Extremity Lymphedema   10 cm Proximal to Olecranon Process 25.2 cm   Olecranon Process 23.4 cm   10  cm Proximal to Ulnar Styloid Process 20 cm   Just Proximal to Ulnar Styloid Process 15 cm   Across Hand at [employer] 19.7 cm   At [place] of 2nd Digit 6.4 cm       Patient was instructed today in a home exercise program today for post op shoulder range of motion. These included active assist shoulder flexion in sitting, scapular retraction, wall walking with shoulder abduction, and hands behind head external rotation.  She was encouraged to do these twice a day, holding 3 seconds and repeating 5 times when permitted by her physician.        PT Education - 12/28/14 0926    Education provided Yes   Education Details Post op HEP and lymphedema risk reduction   Person(s) Educated Patient;Spouse   Methods Explanation;Demonstration;Handout   Comprehension Verbalized understanding              Breast Clinic Goals - 12/28/14 [phone]    Patient will be able to verbalize understanding of pertinent lymphedema risk reduction practices relevant to her diagnosis specifically related to skin care.   Time 1   Period Days   Status Achieved   Patient will be able to return demonstrate and/or verbalize understanding of the post-op home exercise program related to regaining shoulder range of motion.   Time 1   Period Days   Status Achieved   Patient will be able to verbalize understanding of the importance of attending the postoperative After Breast Cancer Class for further lymphedema risk reduction education and therapeutic exercise.   Time 1   Period Days   Status Achieved              Plan - 12/28/14 0926    Clinical Impression Statement Patient was seen today for a baseline assessment of her left breast cancer.  She is planning to undergo neoadjuvant chemotherapy followed by a left lumpectomy and sentinel node biopsy followed by radiation.  She will likely benefit from post op physical therapy to regain shoulder ROM and prevent lymphedema.   Pt will benefit from skilled  therapeutic intervention in order to improve on the following deficits Decreased range of motion;Increased edema;Pain;Impaired UE functional use;Decreased knowledge of precautions;Decreased strength   Rehab Potential Excellent   Clinical Impairments Affecting Rehab Potential none   PT Frequency One time visit   PT Treatment/Interventions Patient/family education;Therapeutic exercise   Consulted and Agree with Plan of Care Patient;Family member/caregiver   Family Member Consulted Husband     Patient will follow up at outpatient cancer rehab if needed following surgery.  If the patient requires physical therapy at that time, a specific plan will be dictated and sent to  the referring physician for approval. The patient was educated today on appropriate basic range of motion exercises to begin post operatively and the importance of attending the After Breast Cancer class following surgery.  Patient was educated today on lymphedema risk reduction practices as it pertains to recommendations that will benefit the patient immediately following surgery.  She verbalized good understanding.  No additional physical therapy is indicated at this time.       Problem List Patient Active Problem List   Diagnosis Date Noted  . Breast cancer of upper-outer quadrant of left female breast 12/22/2014    Annia Friendly, PT 12/28/2014, 11:23 AM  [place], [place], [address] Phone: [phone]   Fax:  [phone]

## 2014-12-29 ENCOUNTER — Encounter: Payer: Self-pay | Admitting: *Deleted

## 2014-12-29 NOTE — Progress Notes (Signed)
[place] Work  Clinical Social Work was referred by patient navigator for assessment of psychosocial needs due to additional supports requested.  Clinical Social Worker attempted to contact patient via cell to offer support and assess for needs.  CSW left vm for pt and will try again in the am.   Loren Racer, [place] Worker [place]  [facility] Phone: [phone] Fax: [phone]

## 2014-12-30 ENCOUNTER — Encounter: Payer: Self-pay | Admitting: *Deleted

## 2014-12-30 NOTE — Progress Notes (Signed)
[place] Work  Clinical Social Work spoke with pt. She is most interested in counseling for her and her husband as she begins her cancer journey. She is open to referral to counseling interns and agrees to them contacting her for an appointment. Referral made to [place], Management consultant.  Clinical Social Work interventions: Referral  Loren Racer, [place] Worker [place]  [place] Phone: [phone] Fax: [phone]

## 2015-01-02 ENCOUNTER — Other Ambulatory Visit: Payer: Self-pay | Admitting: Surgery

## 2015-01-02 ENCOUNTER — Telehealth: Payer: Self-pay | Admitting: Oncology

## 2015-01-02 ENCOUNTER — Telehealth: Payer: Self-pay | Admitting: *Deleted

## 2015-01-02 NOTE — Telephone Encounter (Signed)
Spoke with patient and she is aware of her new apptment line up to get appts together

## 2015-01-02 NOTE — Telephone Encounter (Signed)
Spoke with pt concerning Rhonda Gardner from 12/28/14. Denies questions or concerns regarding dx or treatment care plan. Confirmed future appt with pt. Encourage pt to call with needs. Received verbal understanding.

## 2015-01-03 ENCOUNTER — Other Ambulatory Visit (HOSPITAL_COMMUNITY): Payer: BLUE CROSS/BLUE SHIELD

## 2015-01-04 ENCOUNTER — Other Ambulatory Visit: Payer: BLUE CROSS/BLUE SHIELD

## 2015-01-05 ENCOUNTER — Other Ambulatory Visit: Payer: Self-pay | Admitting: General Surgery

## 2015-01-06 ENCOUNTER — Other Ambulatory Visit: Payer: Self-pay | Admitting: *Deleted

## 2015-01-06 ENCOUNTER — Telehealth: Payer: Self-pay | Admitting: Oncology

## 2015-01-06 DIAGNOSIS — C50412 Malignant neoplasm of upper-outer quadrant of left female breast: Secondary | ICD-10-CM

## 2015-01-06 NOTE — Telephone Encounter (Signed)
pof noted for ir port placement,patient will get a call from ir

## 2015-01-09 ENCOUNTER — Encounter: Payer: BLUE CROSS/BLUE SHIELD | Admitting: Genetic Counselor

## 2015-01-09 ENCOUNTER — Other Ambulatory Visit: Payer: BLUE CROSS/BLUE SHIELD

## 2015-01-09 ENCOUNTER — Other Ambulatory Visit: Payer: Self-pay | Admitting: *Deleted

## 2015-01-09 DIAGNOSIS — I878 Other specified disorders of veins: Secondary | ICD-10-CM

## 2015-01-09 DIAGNOSIS — C50912 Malignant neoplasm of unspecified site of left female breast: Secondary | ICD-10-CM

## 2015-01-10 ENCOUNTER — Other Ambulatory Visit: Payer: Self-pay | Admitting: Radiology

## 2015-01-10 ENCOUNTER — Telehealth: Payer: Self-pay | Admitting: Oncology

## 2015-01-10 NOTE — Telephone Encounter (Signed)
to earlier appt-pt stated that per [facility] her Navigator stated she did nothave to stay the entire class that day and she was allowed to leave @ towards the end because not neccesay

## 2015-01-12 ENCOUNTER — Ambulatory Visit (HOSPITAL_COMMUNITY)
Admission: RE | Admit: 2015-01-12 | Discharge: 2015-01-12 | Disposition: A | Discharge status: Home / Self Care | Payer: BLUE CROSS/BLUE SHIELD | Source: Ambulatory Visit | Attending: Oncology | Admitting: Oncology

## 2015-01-12 ENCOUNTER — Other Ambulatory Visit: Payer: BLUE CROSS/BLUE SHIELD

## 2015-01-12 ENCOUNTER — Ambulatory Visit: Payer: BLUE CROSS/BLUE SHIELD | Admitting: Genetic Counselor

## 2015-01-12 ENCOUNTER — Other Ambulatory Visit: Payer: Self-pay | Admitting: Radiology

## 2015-01-12 ENCOUNTER — Ambulatory Visit: Payer: BLUE CROSS/BLUE SHIELD | Admitting: Oncology

## 2015-01-12 ENCOUNTER — Encounter: Payer: Self-pay | Admitting: Internal Medicine

## 2015-01-12 ENCOUNTER — Other Ambulatory Visit: Payer: Self-pay | Admitting: *Deleted

## 2015-01-12 ENCOUNTER — Telehealth: Payer: Self-pay | Admitting: Oncology

## 2015-01-12 ENCOUNTER — Encounter: Payer: Self-pay | Admitting: *Deleted

## 2015-01-12 ENCOUNTER — Ambulatory Visit (HOSPITAL_BASED_OUTPATIENT_CLINIC_OR_DEPARTMENT_OTHER): Payer: BLUE CROSS/BLUE SHIELD | Admitting: Oncology

## 2015-01-12 VITALS — BP 120/57 | HR 75 | Temp 98.2°F | Resp 18 | Ht 68.0 in | Wt 141.4 lb

## 2015-01-12 DIAGNOSIS — Z171 Estrogen receptor negative status [ER-]: Secondary | ICD-10-CM | POA: Diagnosis not present

## 2015-01-12 DIAGNOSIS — Z5111 Encounter for antineoplastic chemotherapy: Secondary | ICD-10-CM | POA: Diagnosis not present

## 2015-01-12 DIAGNOSIS — I071 Rheumatic tricuspid insufficiency: Secondary | ICD-10-CM | POA: Insufficient documentation

## 2015-01-12 DIAGNOSIS — C50412 Malignant neoplasm of upper-outer quadrant of left female breast: Secondary | ICD-10-CM | POA: Diagnosis present

## 2015-01-12 DIAGNOSIS — Z79899 Other long term (current) drug therapy: Secondary | ICD-10-CM | POA: Diagnosis not present

## 2015-01-12 DIAGNOSIS — C50919 Malignant neoplasm of unspecified site of unspecified female breast: Secondary | ICD-10-CM | POA: Insufficient documentation

## 2015-01-12 DIAGNOSIS — C50912 Malignant neoplasm of unspecified site of left female breast: Secondary | ICD-10-CM

## 2015-01-12 MED ORDER — PROCHLORPERAZINE MALEATE 10 MG PO TABS: 10.0000 mg | ORAL_TABLET | Freq: Four times a day (QID) | ORAL | Status: DC | PRN

## 2015-01-12 MED ORDER — DEXAMETHASONE 4 MG PO TABS: 8.0000 mg | ORAL_TABLET | Freq: Two times a day (BID) | ORAL | Status: DC

## 2015-01-12 MED ORDER — LAMICTAL 200 MG PO TABS: 100.0000 mg | ORAL_TABLET | Freq: Every day | ORAL | Status: DC

## 2015-01-12 MED ORDER — LIDOCAINE-PRILOCAINE 2.5-2.5 % EX CREA
TOPICAL_CREAM | CUTANEOUS | Status: DC
Start: 2015-01-12 — End: 2015-11-06

## 2015-01-12 MED ORDER — TOBRAMYCIN-DEXAMETHASONE 0.3-0.1 % OP SUSP: 1.0000 [drp] | Freq: Two times a day (BID) | OPHTHALMIC | Status: DC

## 2015-01-12 MED ORDER — LORATADINE 10 MG PO TABS
ORAL_TABLET | ORAL | Status: DC
Start: 2015-01-12 — End: 2015-05-08

## 2015-01-12 MED ORDER — ONDANSETRON HCL 8 MG PO TABS: 8.0000 mg | ORAL_TABLET | Freq: Two times a day (BID) | ORAL | Status: DC

## 2015-01-12 MED ORDER — LORAZEPAM 0.5 MG PO TABS: 0.5000 mg | ORAL_TABLET | Freq: Every evening | ORAL | Status: DC | PRN

## 2015-01-12 NOTE — Progress Notes (Unsigned)
Met with pt during f/u with Dr. Jana Hakim. Denies needs at this time. Encourage pt to call with questions or concerns. Confirmed port placement by IR tomorrow.

## 2015-01-12 NOTE — Telephone Encounter (Signed)
avs printed for patient but orders had not been sent yet.  Patient aware that I will schedule appointments once pof is sent and patient will view on mychart and get a new avs on 4/4

## 2015-01-12 NOTE — Progress Notes (Signed)
[place]  [phone] Fax:([phone]     ID: Rhonda Gardner DOB: 05-30-1956  MR#: [id number]  YME#:[id number]  Patient Care Team: Rita Ohara, MD as PCP - General (Family Medicine) Erroll Luna, MD as Consulting Physician (General Surgery) Chauncey Cruel, MD as Consulting Physician (Oncology) Thea Silversmith, MD as Consulting Physician (Radiation Oncology) Mauro Kaufmann, RN as Registered Nurse Rockwell Germany, RN as Registered Nurse Holley Bouche, NP as Nurse Practitioner (Nurse Practitioner) PCP: Vikki Ports, MD OTHER MD:  CHIEF COMPLAINT: Triple negative breast cancer  CURRENT TREATMENT: Awaiting neoadjuvant chemotherapy   BREAST CANCER HISTORY: From the original intake note:  "Rhonda Gardner" had not had a physical exam in quite some time, and had not had a mammogram since 2020, when she was evaluated by Dr. Tomi Bamberger 12/01/2014. Dr. Tomi Bamberger was able to palpate a 2 cm firm mass at the 3:00 position in the left breast. The patient herself has been aware of multiple breast masses and does have a history of fibrocystic change, with multiple prior benign biopsies. She did not feel this particular mass had changed recently.   Dr. Tomi Bamberger arranged for bilateral diagnostic mammography and left breast ultrasonography at the [facility] Center 12/15/2014. This shows the breast density to be category C. In the left breast upper outer quadrant there was a bilobed mass measuring approximately 2 cm. This was palpable. Ultrasound confirmed an irregular microlobulated and hypoechoic mass measuring 2.1 cm at the 2:00 location 6 cm from the nipple. There was no left axillary adenopathy.  Biopsy of the mass in question 12/19/2014 showed ([id number]) invasive ductal carcinoma, grade 2 or 3, estrogen and progesterone receptor negative, with an MIB-1 of 38%, and equivocal HER-2 amplification, the signals ratio being 1.57, the number per cell 4.25.  On 12/27/2014 the patient  underwent bilateral breast MRI. The mass in question at the 2:00 location of the left breast measured 2.5 cm. It directly abuts the left pectoralis major. There is no enhancement in the muscle itself. There were no abnormal appearing lymph nodes. There was an incidentally noted 1 cm hepatic cyst at the dome of the right liver lobe.  Her subsequent history is as detailed below  INTERVAL HISTORY: Rhonda Gardner returns today accompanied by her husband Rhonda Gardner for further discussion of her neoadjuvant treatment..since her last visit with me pathology retested the tumor for HER-2 sensitivity and it was positive, with a ratio of 3.4. This changes the treatment plan and the patient is here to discuss this in detail..  She also had an echocardiogram this morning, with results pending. She's been scheduled for port placement tomorrow.  REVIEW OF SYSTEMS: She had an episode of fever 200.3 and diarrhea approximately 7 days ago lasting about 2-3 days. Her head was "fuzzy" at the time as if she might fall. She didn't have sinus symptoms. All that however has completely cleared. A detailed review of systems today is otherwise entirely benign.  PAST MEDICAL HISTORY: Past Medical History  Diagnosis Date  . Allergy 2006    chemical cauterization in spring 2006, and 2nd treatment with good results.(Dr.Shoemaker)  . Bipolar disorder   . Breast cancer 12/2014    invasive ductal carcinoma (LEFT)  . Breast cancer of upper-outer quadrant of left female breast 12/22/2014    PAST SURGICAL HISTORY: Past Surgical History  Procedure Laterality Date  . Breast biopsy Bilateral     benign and a right stereotatic breast biopsy.  . Foot surgery Left 2014  Dr. Paulla Dolly  . Fibroid excision  age 3    prolapsed fibroid removed    FAMILY HISTORY Family History  Problem Relation Age of Onset  . Cancer Father     skin - non melanoma - farmer  . Heart disease Father   . Hypertension Mother   . Arthritis Mother     osteoarthritis    . Colon cancer Mother   . Macular degeneration Mother     wet and dry  . Cancer Mother 67    colon cancer at age 38  . Osteoporosis Sister     osteopenia  . Cancer Maternal Aunt     lung cancer  . Diabetes Paternal Aunt   . Cancer Paternal Aunt 18    ? stomach cancer  . Autism Sister   . Mental retardation Sister   . Blindness Sister     secondary to retrolental fibroplasia  . Goiter Paternal Aunt    the patient's father lived to be 95. He had a history of nonmelanoma skin cancers. [name] mother is living at age 81. She was diagnosed with colon cancer in her 54s. A paternal aunt may have had stomach cancer. Maternal aunt developed lung cancer in her 71s. The patient has one brother, 2 sisters. One of her sisters is blind and mentally retarded. There is no history of breast or ovarian cancer in the family.  GYNECOLOGIC HISTORY:  No LMP recorded. Menarche age 6. The patient is GX P0. She stopped having periods in her early 33s. She did not take hormone replacement. She took oral contraceptives remotely for over 5 years with no complications.  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 Her husband Rhonda Gardner worked in IT originally but now runs a family firm. It's just the 2 of them at home, plus a chocolate lab.    ADVANCED DIRECTIVES: In place   HEALTH MAINTENANCE: History  Substance Use Topics  . Smoking status: Never Smoker   . Smokeless tobacco: Never Used  . Alcohol Use: No     Colonoscopy: In [place] under Dr. Lavina Hamman, date not entirely clear  PAP: February 2016  Bone density: Under Sander Radon, results not currently available  Lipid panel:  No Known Allergies  Current Outpatient Prescriptions  Medication Sig Dispense Refill  . Cholecalciferol (VITAMIN D) 2000 UNITS tablet Take 2,000 Units by mouth daily.    . Cyanocobalamin (VITAMIN B-12) 5000 MCG SUBL Place 5,000 mcg under the tongue daily.    Marland Kitchen dexamethasone (DECADRON) 4 MG tablet Take 2  tablets (8 mg total) by mouth 2 (two) times daily. Start the day before Taxotere. Then again the day after chemo for 3 days. 30 tablet 1  . LAMICTAL 200 MG tablet Take 0.5 tablets (100 mg total) by mouth daily. 90 tablet 0  . lidocaine-prilocaine (EMLA) cream Apply to affected area once 30 g 3  . loratadine (CLARITIN) 10 MG tablet Take one tablet on the day of neulasta shot, and repeat the next 2 days 100 tablet 3  . LORazepam (ATIVAN) 0.5 MG tablet Take 1 tablet (0.5 mg total) by mouth at bedtime as needed (Nausea or vomiting). 30 tablet 0  . LUTEIN-ZEAXANTHIN PO Take 1 tablet by mouth daily.    Marland Kitchen MAGNESIUM CITRATE PO Take 250 mg by mouth daily.    . Multiple Vitamins-Minerals (PRESERVISION AREDS) CAPS Take 1 capsule by mouth 2 (two) times daily.    . naproxen sodium (ANAPROX) 220 MG tablet Take 220-440 mg by  mouth daily as needed (pain). Aleve    . ondansetron (ZOFRAN) 8 MG tablet Take 1 tablet (8 mg total) by mouth 2 (two) times daily. Start the day after chemo for 3 days. Then take as needed for nausea or vomiting. 30 tablet 1  . prochlorperazine (COMPAZINE) 10 MG tablet Take 1 tablet (10 mg total) by mouth every 6 (six) hours as needed (Nausea or vomiting). 30 tablet 1  . tobramycin-dexamethasone (TOBRADEX) ophthalmic solution Place 1 drop into both eyes 2 (two) times daily. 5 mL 0  . TURMERIC PO Take 1 capsule by mouth 2 (two) times daily.     No current facility-administered medications for this visit.    OBJECTIVE: Middle-aged white woman in no acute distress  Filed Vitals:   01/12/15 1403  BP: 120/57  Pulse: 75  Temp: 98.2 F (36.8 C)  Resp: 18     Body mass index is 21.5 kg/(m^2).    ECOG FS:0 - Asymptomatic  Sclerae unicteric, pupils equal and reactive Oropharynx clear and moist No cervical or supraclavicular adenopathy Lungs no rales or rhonchi Heart regular rate and rhythm Abd soft, nontender, positive bowel sounds MSK no focal spinal tenderness, no upper extremity  lymphedema Neuro: nonfocal, well oriented, appropriate affect Breasts: deferred    LAB RESULTS:  CMP     Component Value Date/Time   NA 141 12/28/2014 0816   NA 137 12/01/2014 1437   K 4.2 12/28/2014 0816   K 3.6 12/01/2014 1437   CL 101 12/01/2014 1437   CO2 28 12/28/2014 0816   CO2 25 12/01/2014 1437   GLUCOSE 92 12/28/2014 0816   GLUCOSE 85 12/01/2014 1437   BUN 14.8 12/28/2014 0816   BUN 16 12/01/2014 1437   CREATININE 0.8 12/28/2014 0816   CREATININE 0.80 12/01/2014 1437   CALCIUM 9.2 12/28/2014 0816   CALCIUM 9.4 12/01/2014 1437   PROT 7.0 12/28/2014 0816   PROT 7.0 12/01/2014 1437   ALBUMIN 4.3 12/28/2014 0816   ALBUMIN 4.8 12/01/2014 1437   AST 43* 12/28/2014 0816   AST 28 12/01/2014 1437   ALT 36 12/28/2014 0816   ALT 24 12/01/2014 1437   ALKPHOS 75 12/28/2014 0816   ALKPHOS 65 12/01/2014 1437   BILITOT 0.41 12/28/2014 0816   BILITOT 0.6 12/01/2014 1437    INo results found for: SPEP, UPEP  Lab Results  Component Value Date   WBC 4.2 12/28/2014   NEUTROABS 2.5 12/28/2014   HGB 12.8 12/28/2014   HCT 40.0 12/28/2014   MCV 92.1 12/28/2014   PLT 192 12/28/2014      Chemistry      Component Value Date/Time   NA 141 12/28/2014 0816   NA 137 12/01/2014 1437   K 4.2 12/28/2014 0816   K 3.6 12/01/2014 1437   CL 101 12/01/2014 1437   CO2 28 12/28/2014 0816   CO2 25 12/01/2014 1437   BUN 14.8 12/28/2014 0816   BUN 16 12/01/2014 1437   CREATININE 0.8 12/28/2014 0816   CREATININE 0.80 12/01/2014 1437      Component Value Date/Time   CALCIUM 9.2 12/28/2014 0816   CALCIUM 9.4 12/01/2014 1437   ALKPHOS 75 12/28/2014 0816   ALKPHOS 65 12/01/2014 1437   AST 43* 12/28/2014 0816   AST 28 12/01/2014 1437   ALT 36 12/28/2014 0816   ALT 24 12/01/2014 1437   BILITOT 0.41 12/28/2014 0816   BILITOT 0.6 12/01/2014 1437       No results found for: LABCA2  No components  found for: [id number]  No results for input(s): INR in the last 168  hours.  Urinalysis    Component Value Date/Time   LABSPEC 1.010 08/09/2007 2121   PHURINE 7.5 08/09/2007 2121   GLUCOSEU NEGATIVE 08/09/2007 2121   HGBUR SMALL* 08/09/2007 2121   BILIRUBINUR neg 12/01/2014 1358   BILIRUBINUR NEGATIVE 08/09/2007 2121   KETONESUR NEGATIVE 08/09/2007 2121   PROTEINUR neg 12/01/2014 1358   PROTEINUR NEGATIVE 08/09/2007 2121   UROBILINOGEN negative 12/01/2014 1358   UROBILINOGEN 0.2 08/09/2007 2121   NITRITE neg 12/01/2014 1358   NITRITE NEGATIVE 08/09/2007 2121   LEUKOCYTESUR Negative 12/01/2014 1358    STUDIES: Mr Breast Bilateral W Wo Contrast  12/27/2014   CLINICAL DATA:  Newly diagnosed left breast 2 o'clock location invasive ductal carcinoma. Preoperative MRI.  LABS:  Not performed  EXAM: BILATERAL BREAST MRI WITH AND WITHOUT CONTRAST  TECHNIQUE: Multiplanar, multisequence MR images of both breasts were obtained prior to and following the intravenous administration of 13 ml of MultiHance.  THREE-DIMENSIONAL MR IMAGE RENDERING ON INDEPENDENT WORKSTATION:  Three-dimensional MR images were rendered by post-processing of the original MR data on an independent workstation. The three-dimensional MR images were interpreted, and findings are reported in the following complete MRI report for this study. Three dimensional images were evaluated at the independent DynaCad workstation  COMPARISON:  Prior mammograms and left breast ultrasound  FINDINGS: Breast composition: c.  Heterogeneous fibroglandular tissue.  Background parenchymal enhancement: Minimal  Right breast: No mass or abnormal enhancement.  Left breast: In the left breast 2 o'clock location posterior depth is a bilobed enhancing mass with adjacent clip artifact measuring overall 2.5 x 1.6 x 1.5 cm, demonstrating wash-in type enhancement kinetics. This corresponds to the biopsy-proven malignancy. This directly abuts the subjacent left pectoralis major muscle and superficial involvement is not excluded  although there is no enhancement within the left pectoralis major muscle or underlying chest wall structures.  Lymph nodes: No abnormal appearing lymph nodes.  Ancillary findings: 1 cm T2 hyperintense hepatic cyst incidentally noted at the dome of the right hepatic lobe.  IMPRESSION: 2.5 cm enhancing left breast 2 o'clock location mass compatible with biopsy-proven malignancy. This directly abuts the left pectoralis major muscle and superficial muscle involvement is possible although there is no abnormal intramuscular enhancement or enhancement of the underlying chest wall structures.  No MRI evidence for contralateral or multifocal/ multicentric malignancy.  RECOMMENDATION: Treatment plan  BI-RADS CATEGORY  6: Known biopsy-proven malignancy.   Electronically Signed   By: Conchita Paris M.D.   On: 12/27/2014 11:59   Mm Digital Diagnostic Unilat L  12/19/2014   CLINICAL DATA:  Post left breast ultrasound-guided biopsy.  EXAM: DIAGNOSTIC left breast MAMMOGRAM POST ultrasound BIOPSY  COMPARISON:  Previous exam(s).  FINDINGS: Mammographic images were obtained following ultrasound guided biopsy of the mass located within the lateral left breast at the 2:30 o'clock position. The coil shaped clip is associated with the posterior portion of the mass on the left MLO view. Despite multiple attempts, the clip could not be visualized in the CC projection due to the posterior and lateral location of the mass.  IMPRESSION: Appropriate position of clip on left MLO view. The clip and posterior portion of the left breast mass could not be visualized on today's post clip left CC views despite multiple attempts.  Final Assessment: Post Procedure Mammograms for Marker Placement   Electronically Signed   By: Altamese Cabal M.D.   On: 12/19/2014 12:20   US Breast  Sampson Axilla  12/15/2014   CLINICAL DATA:  Palpable left upper outer quadrant breast mass per patient.  EXAM: DIGITAL DIAGNOSTIC bilateral MAMMOGRAM WITH 3D  TOMOSYNTHESIS WITH CAD  ULTRASOUND left BREAST  COMPARISON:  The most recent comparison exam is dated 03/17/2009  ACR Breast Density Category c: The breast tissue is heterogeneously dense, which may obscure small masses.  FINDINGS: A bilobed mass is identified in the left breast upper outer quadrant corresponding to the questioned palpable finding measuring approximately 2 cm. No abnormality is identified elsewhere in either breast.  Mammographic images were processed with CAD.  On physical exam, I palpate a firm mass in the left breast upper outer quadrant.  Targeted ultrasound is performed, showing an irregular microlobulated hypoechoic mass left breast 2 o'clock location 6 cm from the nipple measuring 2.1 x 1.9 x 1.3 cm. This corresponds to the palpable and mammographic finding. No left axillary lymphadenopathy.  IMPRESSION: Suspicious left breast mass 2 o'clock location. Ultrasound-guided core biopsy will be scheduled at the patient's convenience.  RECOMMENDATION: Left ultrasound-guided core biopsy  I have discussed the findings and recommendations with the patient. Results were also provided in writing at the conclusion of the visit. If applicable, a reminder letter will be sent to the patient regarding the next appointment.  BI-RADS CATEGORY  4: Suspicious.   Electronically Signed   By: Conchita Paris M.D.   On: 12/15/2014 11:16   Mm Diag Breast Tomo Bilateral  12/15/2014   CLINICAL DATA:  Palpable left upper outer quadrant breast mass per patient.  EXAM: DIGITAL DIAGNOSTIC bilateral MAMMOGRAM WITH 3D TOMOSYNTHESIS WITH CAD  ULTRASOUND left BREAST  COMPARISON:  The most recent comparison exam is dated 03/17/2009  ACR Breast Density Category c: The breast tissue is heterogeneously dense, which may obscure small masses.  FINDINGS: A bilobed mass is identified in the left breast upper outer quadrant corresponding to the questioned palpable finding measuring approximately 2 cm. No abnormality is identified  elsewhere in either breast.  Mammographic images were processed with CAD.  On physical exam, I palpate a firm mass in the left breast upper outer quadrant.  Targeted ultrasound is performed, showing an irregular microlobulated hypoechoic mass left breast 2 o'clock location 6 cm from the nipple measuring 2.1 x 1.9 x 1.3 cm. This corresponds to the palpable and mammographic finding. No left axillary lymphadenopathy.  IMPRESSION: Suspicious left breast mass 2 o'clock location. Ultrasound-guided core biopsy will be scheduled at the patient's convenience.  RECOMMENDATION: Left ultrasound-guided core biopsy  I have discussed the findings and recommendations with the patient. Results were also provided in writing at the conclusion of the visit. If applicable, a reminder letter will be sent to the patient regarding the next appointment.  BI-RADS CATEGORY  4: Suspicious.   Electronically Signed   By: Conchita Paris M.D.   On: 12/15/2014 11:16   [place] Lt Breast Bx W Loc Dev 1st Lesion Img Bx Spec US Guide  12/21/2014   ADDENDUM REPORT: 12/21/2014 12:53  ADDENDUM: PATHOLOGY ADDENDUM:  Pathology: Invasive ductal carcinoma.  Pathology concordance with imaging findings: Yes  Recommendation: Further evaluation with MRI is recommended. The patient has been scheduled for an MRI on 12/27/2014 at 10:15 a.m. Surgical consultation is also recommended. The patient is scheduled to be seen in Multidisciplinary Clinic on 12/28/2014.  At the request of the patient, I spoke with her by telephone on 12/21/2014 at 12:50. She reports doing well after the biopsy other than tenderness at the biopsy  site.   Electronically Signed   By: Pamelia Hoit M.D.   On: 12/21/2014 12:53   12/21/2014   CLINICAL DATA:  Left breast mass.  EXAM: ULTRASOUND GUIDED left BREAST CORE NEEDLE BIOPSY  COMPARISON:  Previous exam(s).  FINDINGS: I met with the patient and we discussed the procedure of ultrasound-guided biopsy, including benefits and alternatives. We discussed  the high likelihood of a successful procedure. We discussed the risks of the procedure, including infection, bleeding, tissue injury, clip migration, and inadequate sampling. Informed written consent was given. The usual time-out protocol was performed immediately prior to the procedure.  Using sterile technique and 2% Lidocaine as local anesthetic, under direct ultrasound visualization, a 14 gauge spring-loaded device was used to perform biopsy of the mass located within the left breast at the 2:30 o'clock position using a lateral approach. At the conclusion of the procedure a coil shaped tissue marker clip was deployed into the biopsy cavity. Follow up 2 view mammogram was performed and dictated separately.  IMPRESSION: Ultrasound guided biopsy of left breast mass located the 2:30 o'clock position as discussed above. No apparent complications.  Electronically Signed: By: Altamese Cabal M.D. On: 12/19/2014 12:18    ASSESSMENT: 59 y.o. [place] woman s/p left breast upper outer quadrant biopsy 12/19/2014 for a clinical T2 N0, stage IIA invasive ductal carcinoma, grade 2 or 3, estrogen and progesterone receptor negative, with an MIB-1 of 38%, HER-2 equivocal initially but positive on further testing with a ratio of 3.4  (1) neoadjuvant chemotherapy to consist of carboplatin, docetaxel, trastuzumab and pertuzumab given every 3 weeks for 6 cycles with Neulasta support  (2) definitive surgery to follow chemotherapy  (3) radiation to follow surgery  (4) genetics counselor felt the risk of a familial mutation was sufficiently low testing could be omitted  PLAN: We spent approximately 45 minutes going over the new situation. Rhonda Gardner understands being HER-2 positive is very favorable. It will essentially cut in half her residual risk. If her risk of recurrence within 10 years is 42% after optimal local treatment, chemotherapy would lower that by one third, to about 28%. Anti-HER-2 treatment would cut the  residual risk in half to about 14%. If she obtains a complete pathologic response her prognosis might be even better.  She came to chemotherapy school today we reviewed the side effects, toxicities and complications specific to the treatments that she will be receiving. I also answered all her prescriptions and gave her a road map on how to take hersupportive medications.  She will have her first treatment April 4. She will see [place] a week later to troubleshoot any problems that may have developed. Once she completes the chemotherapy portion of her treatment she will proceed to definitive surgery, but continue the Herceptin to complete one year.  The patient has a good understanding of the overall plan. She agrees with it. She knows the goal of treatment in her case is cure. She will call with any problems that may develop before her next visit here.  Chauncey Cruel, MD   01/12/2015 3:00 PM Medical Oncology and Hematology [facility] [address] [place], [place] [address] Tel. [phone]    Fax. [phone]

## 2015-01-12 NOTE — Progress Notes (Signed)
Rhonda Gardner Visit  REFERRING PROVIDER: Rita Ohara, MD [address] [place], [place] [address]  PRIMARY PROVIDER:  Vikki Ports, MD  PRIMARY REASON FOR VISIT:  1. Breast cancer, left     HISTORY OF PRESENT ILLNESS:   Rhonda Gardner, a 59 y.o. female, was seen for a [facility] cancer genetics consultation at the request of Dr. Tomi Bamberger due to a personal history of breast cancer.  Rhonda Gardner presents to clinic today to discuss the possibility of a hereditary predisposition to cancer, genetic testing, and to further clarify her future cancer risks, as well as potential cancer risks for family members. She was accompanied to clinic by her husband, [name].  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 This directly abuts the left pectoralis major muscle and superficial muscle involvement is possible although there is no abnormal intramuscular enhancement o    Past Medical History  Diagnosis Date   Allergy 2006    chemical cauterization in spring 2006, and 2nd treatment with good results.(Dr.Shoemaker)   Bipolar disorder    Breast cancer 12/2014    invasive ductal carcinoma (LEFT)   Breast cancer of upper-outer quadrant of left female breast 12/22/2014    Past Surgical History  Procedure Laterality Date   Breast biopsy Bilateral     benign and a right stereotatic breast biopsy.   Foot surgery Left 2014    Dr.  Paulla Dolly   Fibroid excision  age 47    prolapsed fibroid removed    History   Social History   Marital Status: Married    Spouse Name: N/A   Number of Children: N/A   Years of Education: N/A   Social History Main Topics   Smoking status: Never Smoker    Smokeless tobacco: Never Used   Alcohol Use: No   Drug Use: No   Sexual Activity:    Partners: Male     Comment: postmenopausal   Other Topics Concern   Not on file   Social History Narrative   Married, 1 dog. Homemaker     FAMILY HISTORY:  During the visit, a 4-generation pedigree was obtained. A copy of the pedigree with be scanned into Epic under the Media tab. Significant family history diagnoses include the following: Family History  Problem Relation Age of Onset   Cancer Father     skin - non melanoma - farmer   Heart disease Father    Hypertension Mother    Arthritis Mother     osteoarthritis   Colon cancer Mother    Macular degeneration Mother     wet and dry   Cancer Mother 3    colon cancer at age 53   Osteoporosis Sister     osteopenia   Cancer Maternal Aunt     lung cancer   Diabetes Paternal Aunt    Cancer Paternal Aunt 60    ? stomach cancer   Autism Sister    Mental retardation Sister  Blindness Sister     secondary to retrolental fibroplasia   Goiter Paternal Aunt    Rhonda Gardner's ancestry is of Caucasian descent. There is no known Jewish ancestry or consanguinity.  GENETIC COUNSELING ASSESSMENT:  Rhonda Gardner is a 59 y.o. female with a personal history of left breast cancer diagnosed at age 37. Her breast tumor is not triple negative, and by report today there is no family history of breast cancer (she originally thought she had a maternal aunt with breast cancer). She has a very large maternal and paternal family size and there are many females in the family. Many relatives on both sides of the family have lived cancer-free into their 76s. Her personal and  family history is, therefore, not suggestive of a hereditary predisposition to cancer and, thus, we, discussed and recommended the following at today's visit.   DISCUSSION / PLAN:  We reviewed the characteristics, features and inheritance patterns of hereditary cancer syndromes. We also discussed genetic testing, including the appropriate family members to test, the process of testing, insurance coverage and turn-around-time for results. We discussed the implications of a negative, positive and/or variant of uncertain significant result. We discussed with Rhonda Gardner that the family history is not highly consistent with a familial hereditary cancer syndrome, and we feel she is at low risk to harbor a gene mutation associated with such a condition. Thus, we did not recommend any genetic testing, at this time, and recommended Rhonda Gardner continue to follow the cancer management and screening guidelines given by her oncology healthcare providers. Lastly, we encouraged Rhonda Gardner to remain in contact with cancer genetics annually so that we can continuously update the family history and inform her of any changes in cancer genetics and testing that may be of benefit for this family.   Ms.  Gardner's questions were answered to her satisfaction today. Our contact information was provided should additional questions or concerns arise. Thank you for the referral and allowing [place] to share in the care of your Gardner.   Catherine A. Fine, MS, CGC Certified Psychologist, sport and [email or web address] phone: [phone]  The Gardner was seen for a total of 45 minutes in face-to-face genetic counseling.  This Gardner was discussed with Dr. Jana Hakim who agrees with the above.    ______________________________________________________________________ For Office Staff:  Number of people involved in session including genetic counselor: 3 Was an intern or student involved with case: not  applicable

## 2015-01-12 NOTE — Progress Notes (Signed)
Echocardiogram 2D Echocardiogram has been performed.  Rhonda Gardner 01/12/2015, 10:08 AM

## 2015-01-13 ENCOUNTER — Encounter (HOSPITAL_COMMUNITY): Payer: Self-pay

## 2015-01-13 ENCOUNTER — Ambulatory Visit (HOSPITAL_COMMUNITY)
Admission: RE | Admit: 2015-01-13 | Discharge: 2015-01-13 | Disposition: A | Discharge status: Home / Self Care | Payer: BLUE CROSS/BLUE SHIELD | Source: Ambulatory Visit | Attending: Oncology | Admitting: Oncology

## 2015-01-13 VITALS — BP 107/55 | HR 63 | Temp 98.0°F | Resp 16 | Ht 68.0 in | Wt 142.0 lb

## 2015-01-13 DIAGNOSIS — I878 Other specified disorders of veins: Secondary | ICD-10-CM

## 2015-01-13 DIAGNOSIS — C50919 Malignant neoplasm of unspecified site of unspecified female breast: Secondary | ICD-10-CM | POA: Insufficient documentation

## 2015-01-13 DIAGNOSIS — C50412 Malignant neoplasm of upper-outer quadrant of left female breast: Secondary | ICD-10-CM | POA: Diagnosis not present

## 2015-01-13 LAB — BASIC METABOLIC PANEL
Anion gap: 10 (ref 5–15)
BUN: 8 mg/dL (ref 6–23)
CALCIUM: 9.1 mg/dL (ref 8.4–10.5)
CO2: 26 mmol/L (ref 19–32)
CREATININE: 0.77 mg/dL (ref 0.50–1.10)
Chloride: 106 mmol/L (ref 96–112)
GFR calc Af Amer: >90 mL/min (ref >90)
GFR calc non Af Amer: 89 mL/min — ABNORMAL LOW (ref >90)
Glucose, Bld: 83 mg/dL (ref 70–99)
Potassium: 3.8 mmol/L (ref 3.5–5.1)
Sodium: 142 mmol/L (ref 135–145)

## 2015-01-13 LAB — CBC
HCT: 38.2 % (ref 36.0–46.0)
Hemoglobin: 12.8 g/dL (ref 12.0–15.0)
MCH: 30.1 pg (ref 26.0–34.0)
MCHC: 33.5 g/dL (ref 30.0–36.0)
MCV: 89.9 fL (ref 78.0–100.0)
PLATELETS: 230 10*3/uL (ref 150–400)
RBC: 4.25 MIL/uL (ref 3.87–5.11)
RDW: 15 % (ref 11.5–15.5)
WBC: 4.3 10*3/uL (ref 4.0–10.5)

## 2015-01-13 LAB — PROTIME-INR
INR: 0.95 (ref 0.00–1.49)
PROTHROMBIN TIME: 12.7 s (ref 11.6–15.2)

## 2015-01-13 LAB — APTT: APTT: 29 s (ref 24–37)

## 2015-01-13 MED ORDER — CEFAZOLIN SODIUM-DEXTROSE 2-3 GM-% IV SOLR: 2.0000 g | Freq: Once | INTRAVENOUS | Status: DC

## 2015-01-13 MED ORDER — MIDAZOLAM HCL 2 MG/2ML IJ SOLN
INTRAMUSCULAR | Status: AC
  Filled 2015-01-13: qty 4

## 2015-01-13 MED ORDER — CEFAZOLIN SODIUM-DEXTROSE 2-3 GM-% IV SOLR
INTRAVENOUS | Status: AC
  Administered 2015-01-13: 2000 mg
  Filled 2015-01-13: qty 50

## 2015-01-13 MED ORDER — SODIUM CHLORIDE 0.9 % IV SOLN
INTRAVENOUS | Status: AC | PRN
  Administered 2015-01-13: 10 mL/h via INTRAVENOUS

## 2015-01-13 MED ORDER — LIDOCAINE HCL (PF) 1 % IJ SOLN
INTRAMUSCULAR | Status: AC
  Filled 2015-01-13: qty 30

## 2015-01-13 MED ORDER — FENTANYL CITRATE 0.05 MG/ML IJ SOLN
INTRAMUSCULAR | Status: AC
  Filled 2015-01-13: qty 2

## 2015-01-13 MED ORDER — FENTANYL CITRATE 0.05 MG/ML IJ SOLN
INTRAMUSCULAR | Status: AC | PRN
  Administered 2015-01-13: 50 ug via INTRAVENOUS
  Administered 2015-01-13: 25 ug via INTRAVENOUS

## 2015-01-13 MED ORDER — HEPARIN SOD (PORK) LOCK FLUSH 100 UNIT/ML IV SOLN
INTRAVENOUS | Status: AC
  Filled 2015-01-13: qty 10

## 2015-01-13 MED ORDER — SODIUM CHLORIDE 0.9 % IV SOLN
Freq: Once | INTRAVENOUS | Status: AC
  Administered 2015-01-13: 08:00:00 via INTRAVENOUS

## 2015-01-13 MED ORDER — MIDAZOLAM HCL 2 MG/2ML IJ SOLN
INTRAMUSCULAR | Status: AC | PRN
  Administered 2015-01-13: 1 mg via INTRAVENOUS
  Administered 2015-01-13: 0.5 mg via INTRAVENOUS

## 2015-01-13 NOTE — H&P (Signed)
Chief Complaint: New Dx L Breast Ca  Referring Physician(s): Toth,Paul III  History of Present Illness: Rhonda Gardner is a 59 y.o. female   L Breast Ca dx 12/2014 Now scheduled to start chemo therapy 01/16/15 Port a cath placement in IR today   Past Medical History  Diagnosis Date  . Allergy 2006    chemical cauterization in spring 2006, and 2nd treatment with good results.(Dr.Shoemaker)  . Bipolar disorder   . Breast cancer 12/2014    invasive ductal carcinoma (LEFT)  . Breast cancer of upper-outer quadrant of left female breast 12/22/2014    Past Surgical History  Procedure Laterality Date  . Breast biopsy Bilateral     benign and a right stereotatic breast biopsy.  . Foot surgery Left 2014    Dr. Paulla Dolly  . Fibroid excision  age 24    prolapsed fibroid removed    Allergies: Review of patient's allergies indicates no known allergies.  Medications: Prior to Admission medications   Medication Sig Start Date End Date Taking? Authorizing Provider  Cholecalciferol (VITAMIN D) 2000 UNITS tablet Take 2,000 Units by mouth daily.   Yes Historical Provider, MD  Cyanocobalamin (VITAMIN B-12) 5000 MCG SUBL Place 5,000 mcg under the tongue daily.   Yes Historical Provider, MD  LAMICTAL 200 MG tablet Take 0.5 tablets (100 mg total) by mouth daily. 01/12/15  Yes Chauncey Cruel, MD  LUTEIN-ZEAXANTHIN PO Take 1 tablet by mouth daily.   Yes Historical Provider, MD  MAGNESIUM CITRATE PO Take 250 mg by mouth daily.   Yes Historical Provider, MD  Multiple Vitamins-Minerals (PRESERVISION AREDS) CAPS Take 1 capsule by mouth 2 (two) times daily.   Yes Historical Provider, MD  naproxen sodium (ANAPROX) 220 MG tablet Take 220-440 mg by mouth daily as needed (pain). Aleve   Yes Historical Provider, MD  TURMERIC PO Take 1 capsule by mouth 2 (two) times daily.   Yes Historical Provider, MD  dexamethasone (DECADRON) 4 MG tablet Take 2 tablets (8 mg total) by mouth 2 (two) times daily.  Start the day before Taxotere. Then again the day after chemo for 3 days. 01/12/15   Chauncey Cruel, MD  lidocaine-prilocaine (EMLA) cream Apply to affected area once 01/12/15   Chauncey Cruel, MD  loratadine (CLARITIN) 10 MG tablet Take one tablet on the day of neulasta shot, and repeat the next 2 days 01/12/15   Chauncey Cruel, MD  LORazepam (ATIVAN) 0.5 MG tablet Take 1 tablet (0.5 mg total) by mouth at bedtime as needed (Nausea or vomiting). 01/12/15   Chauncey Cruel, MD  ondansetron (ZOFRAN) 8 MG tablet Take 1 tablet (8 mg total) by mouth 2 (two) times daily. Start the day after chemo for 3 days. Then take as needed for nausea or vomiting. 01/12/15   Chauncey Cruel, MD  prochlorperazine (COMPAZINE) 10 MG tablet Take 1 tablet (10 mg total) by mouth every 6 (six) hours as needed (Nausea or vomiting). 01/12/15   Chauncey Cruel, MD  tobramycin-dexamethasone [facility]) ophthalmic solution Place 1 drop into both eyes 2 (two) times daily. 01/12/15   Chauncey Cruel, MD     Family History  Problem Relation Age of Onset  . Cancer Father     skin - non melanoma - farmer  . Heart disease Father   . Hypertension Mother   . Arthritis Mother     osteoarthritis  . Colon cancer Mother   . Macular degeneration Mother  wet and dry  . Cancer Mother 63    colon cancer at age 75  . Osteoporosis Sister     osteopenia  . Cancer Maternal Aunt     lung cancer  . Diabetes Paternal Aunt   . Cancer Paternal Aunt 22    ? stomach cancer  . Autism Sister   . Mental retardation Sister   . Blindness Sister     secondary to retrolental fibroplasia  . Goiter Paternal Aunt     History   Social History  . Marital Status: Married    Spouse Name: N/A  . Number of Children: N/A  . Years of Education: N/A   Social History Main Topics  . Smoking status: Never Smoker   . Smokeless tobacco: Never Used  . Alcohol Use: No  . Drug Use: No  . Sexual Activity:    Partners: Male     Comment:  postmenopausal   Other Topics Concern  . None   Social History Narrative   Married, 1 dog. Homemaker    Review of Systems: A 12 point ROS discussed and pertinent positives are indicated in the HPI above.  All other systems are negative.  Review of Systems  Constitutional: Negative for fever and activity change.  Respiratory: Negative for cough and shortness of breath.   Cardiovascular: Negative for chest pain.  Gastrointestinal: Negative for abdominal pain.  Genitourinary: Negative for difficulty urinating.  Neurological: Negative for weakness.  Psychiatric/Behavioral: Negative for behavioral problems and confusion.     Vital Signs: BP 118/55 mmHg  Pulse 67  Temp(Src) 97.8 F (36.6 C) (Oral)  Resp 18  Ht '5\' 8"'  (1.727 m)  Wt 64.411 kg (142 lb)  BMI 21.60 kg/m2  SpO2 100%  Physical Exam  Constitutional: She is oriented to person, place, and time. She appears well-developed and well-nourished.  Cardiovascular: Normal rate, regular rhythm and normal heart sounds.   No murmur heard. Pulmonary/Chest: Effort normal and breath sounds normal. She has no wheezes.  Abdominal: Soft. Bowel sounds are normal. There is no tenderness.  Musculoskeletal: Normal range of motion.  Neurological: She is alert and oriented to person, place, and time.  Skin: Skin is warm and dry.  Psychiatric: She has a normal mood and affect. Her behavior is normal. Judgment and thought content normal.  Nursing note and vitals reviewed.   Mallampati Score:  MD Evaluation Airway: WNL Heart: WNL Abdomen: WNL Chest/ Lungs: WNL ASA  Classification: 3 Mallampati/Airway Score: One  Imaging: Mr Breast Bilateral W Wo Contrast  12/27/2014   CLINICAL DATA:  Newly diagnosed left breast 2 o'clock location invasive ductal carcinoma. Preoperative MRI.  LABS:  Not performed  EXAM: BILATERAL BREAST MRI WITH AND WITHOUT CONTRAST  TECHNIQUE: Multiplanar, multisequence MR images of both breasts were obtained prior to  and following the intravenous administration of 13 ml of MultiHance.  THREE-DIMENSIONAL MR IMAGE RENDERING ON INDEPENDENT WORKSTATION:  Three-dimensional MR images were rendered by post-processing of the original MR data on an independent workstation. The three-dimensional MR images were interpreted, and findings are reported in the following complete MRI report for this study. Three dimensional images were evaluated at the independent DynaCad workstation  COMPARISON:  Prior mammograms and left breast ultrasound  FINDINGS: Breast composition: c.  Heterogeneous fibroglandular tissue.  Background parenchymal enhancement: Minimal  Right breast: No mass or abnormal enhancement.  Left breast: In the left breast 2 o'clock location posterior depth is a bilobed enhancing mass with adjacent clip artifact measuring overall 2.5 x  1.6 x 1.5 cm, demonstrating wash-in type enhancement kinetics. This corresponds to the biopsy-proven malignancy. This directly abuts the subjacent left pectoralis major muscle and superficial involvement is not excluded although there is no enhancement within the left pectoralis major muscle or underlying chest wall structures.  Lymph nodes: No abnormal appearing lymph nodes.  Ancillary findings: 1 cm T2 hyperintense hepatic cyst incidentally noted at the dome of the right hepatic lobe.  IMPRESSION: 2.5 cm enhancing left breast 2 o'clock location mass compatible with biopsy-proven malignancy. This directly abuts the left pectoralis major muscle and superficial muscle involvement is possible although there is no abnormal intramuscular enhancement or enhancement of the underlying chest wall structures.  No MRI evidence for contralateral or multifocal/ multicentric malignancy.  RECOMMENDATION: Treatment plan  BI-RADS CATEGORY  6: Known biopsy-proven malignancy.   Electronically Signed   By: Conchita Paris M.D.   On: 12/27/2014 11:59   Mm Digital Diagnostic Unilat L  12/19/2014   CLINICAL DATA:  Post  left breast ultrasound-guided biopsy.  EXAM: DIAGNOSTIC left breast MAMMOGRAM POST ultrasound BIOPSY  COMPARISON:  Previous exam(s).  FINDINGS: Mammographic images were obtained following ultrasound guided biopsy of the mass located within the lateral left breast at the 2:30 o'clock position. The coil shaped clip is associated with the posterior portion of the mass on the left MLO view. Despite multiple attempts, the clip could not be visualized in the CC projection due to the posterior and lateral location of the mass.  IMPRESSION: Appropriate position of clip on left MLO view. The clip and posterior portion of the left breast mass could not be visualized on today's post clip left CC views despite multiple attempts.  Final Assessment: Post Procedure Mammograms for Marker Placement   Electronically Signed   By: Altamese Cabal M.D.   On: 12/19/2014 12:20   US Breast Ltd Uni Left Inc Axilla  12/15/2014   CLINICAL DATA:  Palpable left upper outer quadrant breast mass per patient.  EXAM: DIGITAL DIAGNOSTIC bilateral MAMMOGRAM WITH 3D TOMOSYNTHESIS WITH CAD  ULTRASOUND left BREAST  COMPARISON:  The most recent comparison exam is dated 03/17/2009  ACR Breast Density Category c: The breast tissue is heterogeneously dense, which may obscure small masses.  FINDINGS: A bilobed mass is identified in the left breast upper outer quadrant corresponding to the questioned palpable finding measuring approximately 2 cm. No abnormality is identified elsewhere in either breast.  Mammographic images were processed with CAD.  On physical exam, I palpate a firm mass in the left breast upper outer quadrant.  Targeted ultrasound is performed, showing an irregular microlobulated hypoechoic mass left breast 2 o'clock location 6 cm from the nipple measuring 2.1 x 1.9 x 1.3 cm. This corresponds to the palpable and mammographic finding. No left axillary lymphadenopathy.  IMPRESSION: Suspicious left breast mass 2 o'clock location.  Ultrasound-guided core biopsy will be scheduled at the patient's convenience.  RECOMMENDATION: Left ultrasound-guided core biopsy  I have discussed the findings and recommendations with the patient. Results were also provided in writing at the conclusion of the visit. If applicable, a reminder letter will be sent to the patient regarding the next appointment.  BI-RADS CATEGORY  4: Suspicious.   Electronically Signed   By: Conchita Paris M.D.   On: 12/15/2014 11:16   Mm Diag Breast Tomo Bilateral  12/15/2014   CLINICAL DATA:  Palpable left upper outer quadrant breast mass per patient.  EXAM: DIGITAL DIAGNOSTIC bilateral MAMMOGRAM WITH 3D TOMOSYNTHESIS WITH CAD  ULTRASOUND left BREAST  COMPARISON:  The most recent comparison exam is dated 03/17/2009  ACR Breast Density Category c: The breast tissue is heterogeneously dense, which may obscure small masses.  FINDINGS: A bilobed mass is identified in the left breast upper outer quadrant corresponding to the questioned palpable finding measuring approximately 2 cm. No abnormality is identified elsewhere in either breast.  Mammographic images were processed with CAD.  On physical exam, I palpate a firm mass in the left breast upper outer quadrant.  Targeted ultrasound is performed, showing an irregular microlobulated hypoechoic mass left breast 2 o'clock location 6 cm from the nipple measuring 2.1 x 1.9 x 1.3 cm. This corresponds to the palpable and mammographic finding. No left axillary lymphadenopathy.  IMPRESSION: Suspicious left breast mass 2 o'clock location. Ultrasound-guided core biopsy will be scheduled at the patient's convenience.  RECOMMENDATION: Left ultrasound-guided core biopsy  I have discussed the findings and recommendations with the patient. Results were also provided in writing at the conclusion of the visit. If applicable, a reminder letter will be sent to the patient regarding the next appointment.  BI-RADS CATEGORY  4: Suspicious.   Electronically  Signed   By: Conchita Paris M.D.   On: 12/15/2014 11:16   [place] Lt Breast Bx W Loc Dev 1st Lesion Img Bx Spec US Guide  12/21/2014   ADDENDUM REPORT: 12/21/2014 12:53  ADDENDUM: PATHOLOGY ADDENDUM:  Pathology: Invasive ductal carcinoma.  Pathology concordance with imaging findings: Yes  Recommendation: Further evaluation with MRI is recommended. The patient has been scheduled for an MRI on 12/27/2014 at 10:15 a.m. Surgical consultation is also recommended. The patient is scheduled to be seen in Multidisciplinary Clinic on 12/28/2014.  At the request of the patient, I spoke with her by telephone on 12/21/2014 at 12:50. She reports doing well after the biopsy other than tenderness at the biopsy site.   Electronically Signed   By: Pamelia Hoit M.D.   On: 12/21/2014 12:53   12/21/2014   CLINICAL DATA:  Left breast mass.  EXAM: ULTRASOUND GUIDED left BREAST CORE NEEDLE BIOPSY  COMPARISON:  Previous exam(s).  FINDINGS: I met with the patient and we discussed the procedure of ultrasound-guided biopsy, including benefits and alternatives. We discussed the high likelihood of a successful procedure. We discussed the risks of the procedure, including infection, bleeding, tissue injury, clip migration, and inadequate sampling. Informed written consent was given. The usual time-out protocol was performed immediately prior to the procedure.  Using sterile technique and 2% Lidocaine as local anesthetic, under direct ultrasound visualization, a 14 gauge spring-loaded device was used to perform biopsy of the mass located within the left breast at the 2:30 o'clock position using a lateral approach. At the conclusion of the procedure a coil shaped tissue marker clip was deployed into the biopsy cavity. Follow up 2 view mammogram was performed and dictated separately.  IMPRESSION: Ultrasound guided biopsy of left breast mass located the 2:30 o'clock position as discussed above. No apparent complications.  Electronically Signed: By:  Altamese Cabal M.D. On: 12/19/2014 12:18    Labs:  CBC:  Recent Labs  12/01/14 1437 12/28/14 0816 01/13/15 0822  WBC 5.3 4.2 4.3  HGB 13.1 12.8 12.8  HCT 40.7 40.0 38.2  PLT 211 192 230    COAGS:  Recent Labs  01/13/15 0822  INR 0.95  APTT 29    BMP:  Recent Labs  12/01/14 1437 12/28/14 0816  NA 137 141  K 3.6 4.2  CL 101  --   CO2  25 28  GLUCOSE 85 92  BUN 16 14.8  CALCIUM 9.4 9.2  CREATININE 0.80 0.8    LIVER FUNCTION TESTS:  Recent Labs  12/01/14 1437 12/28/14 0816  BILITOT 0.6 0.41  AST 28 43*  ALT 24 36  ALKPHOS 65 75  PROT 7.0 7.0  ALBUMIN 4.8 4.3    TUMOR MARKERS: No results for input(s): AFPTM, CEA, CA199, CHROMGRNA in the last 8760 hours.  Assessment and Plan:  Breast Ca For chemotherapy to start soon Scheduled for PAC placement in IR Risks and Benefits discussed with the patient including, but not limited to bleeding, infection, pneumothorax, or fibrin sheath development and need for additional procedures. All of the patient's questions were answered, patient is agreeable to proceed. Consent signed and in chart.   Thank you for this interesting consult.  I greatly enjoyed meeting Rhonda Gardner and look forward to participating in their care.  Signed: Harlow Carrizales A 01/13/2015, 8:43 AM   I spent a total of  20 Minutes   in face to face in clinical consultation, greater than 50% of which was counseling/coordinating care for [facility] placement

## 2015-01-13 NOTE — Discharge Instructions (Signed)
Venogram, Care After Refer to this sheet in the next few weeks. These instructions provide you with information on caring for yourself after your procedure. Your health care provider may also give you more specific instructions. Your treatment has been planned according to current medical practices, but problems sometimes occur. Call your health care provider if you have any problems or questions after your procedure. WHAT TO EXPECT AFTER THE PROCEDURE After your procedure, it is typical to have the following sensations:  Mild discomfort at the catheter insertion site. HOME CARE INSTRUCTIONS   Take all medicines exactly as directed.  Follow any prescribed diet.  Follow instructions regarding both rest and physical activity.  Drink more fluids for the first several days after the procedure in order to help flush dye from your kidneys. SEEK MEDICAL CARE IF:  You develop a rash.  You have fever not controlled by medicine. SEEK IMMEDIATE MEDICAL CARE IF:  There is pain, drainage, bleeding, redness, swelling, warmth or a red streak at the site of the IV tube.  The extremity where your IV tube was placed becomes discolored, numb, or cool.  You have difficulty breathing or shortness of breath.  You develop chest pain.  You have excessive dizziness or fainting. Document Released: 07/21/2013 Document Revised: 10/05/2013 Document Reviewed: 07/21/2013 [facility] Patient Information 2015 [place], [place]. This information is not intended to replace advice given to you by your health care provider. Make sure you discuss any questions you have with your health care provider. Implanted Port Insertion, Care After Refer to this sheet in the next few weeks. These instructions provide you with information on caring for yourself after your procedure. Your health care provider may also give you more specific instructions. Your treatment has been planned according to current medical practices, but problems  sometimes occur. Call your health care provider if you have any problems or questions after your procedure. WHAT TO EXPECT AFTER THE PROCEDURE After your procedure, it is typical to have the following:   Discomfort at the port insertion site. Ice packs to the area will help.  Bruising on the skin over the port. This will subside in 3-4 days. HOME CARE INSTRUCTIONS  After your port is placed, you will get a manufacturer's information card. The card has information about your port. Keep this card with you at all times.   Know what kind of port you have. There are many types of ports available.   Wear a medical alert bracelet in case of an emergency. This can help alert health care workers that you have a port.   The port can stay in for as long as your health care provider believes it is necessary.   A home health care nurse may give medicines and take care of the port.   You or a family member can get special training and directions for giving medicine and taking care of the port at home.  SEEK MEDICAL CARE IF:   Your port does not flush or you are unable to get a blood return.   You have a fever or chills. SEEK IMMEDIATE MEDICAL CARE IF:  You have new fluid or pus coming from your incision.   You notice a bad smell coming from your incision site.   You have swelling, pain, or more redness at the incision or port site.   You have chest pain or shortness of breath. Document Released: 07/21/2013 Document Revised: 10/05/2013 Document Reviewed: 07/21/2013 [facility] Patient Information 2015 [place], [place]. This information is  not intended to replace advice given to you by your health care provider. Make sure you discuss any questions you have with your health care provider.

## 2015-01-13 NOTE — Procedures (Signed)
RIJV PAC Tip SVC RA No comp 

## 2015-01-16 ENCOUNTER — Other Ambulatory Visit (HOSPITAL_BASED_OUTPATIENT_CLINIC_OR_DEPARTMENT_OTHER): Payer: BLUE CROSS/BLUE SHIELD

## 2015-01-16 ENCOUNTER — Other Ambulatory Visit: Payer: Self-pay | Admitting: Oncology

## 2015-01-16 ENCOUNTER — Encounter: Payer: Self-pay | Admitting: *Deleted

## 2015-01-16 ENCOUNTER — Ambulatory Visit (HOSPITAL_BASED_OUTPATIENT_CLINIC_OR_DEPARTMENT_OTHER): Payer: BLUE CROSS/BLUE SHIELD

## 2015-01-16 DIAGNOSIS — C50412 Malignant neoplasm of upper-outer quadrant of left female breast: Secondary | ICD-10-CM

## 2015-01-16 DIAGNOSIS — Z5112 Encounter for antineoplastic immunotherapy: Secondary | ICD-10-CM

## 2015-01-16 DIAGNOSIS — Z5111 Encounter for antineoplastic chemotherapy: Secondary | ICD-10-CM

## 2015-01-16 LAB — CBC WITH DIFFERENTIAL/PLATELET
BASO%: 0.1 % (ref 0.0–2.0)
BASOS ABS: 0 10*3/uL (ref 0.0–0.1)
EOS%: 0 % (ref 0.0–7.0)
Eosinophils Absolute: 0 10*3/uL (ref 0.0–0.5)
HCT: 37.5 % (ref 34.8–46.6)
HEMOGLOBIN: 12.4 g/dL (ref 11.6–15.9)
LYMPH#: 0.7 10*3/uL — AB (ref 0.9–3.3)
LYMPH%: 8.2 % — ABNORMAL LOW (ref 14.0–49.7)
MCH: 30.2 pg (ref 25.1–34.0)
MCHC: 33.1 g/dL (ref 31.5–36.0)
MCV: 91.2 fL (ref 79.5–101.0)
MONO#: 0.8 10*3/uL (ref 0.1–0.9)
MONO%: 8.4 % (ref 0.0–14.0)
NEUT#: 7.4 10*3/uL — ABNORMAL HIGH (ref 1.5–6.5)
NEUT%: 83.3 % — ABNORMAL HIGH (ref 38.4–76.8)
Platelets: 232 10*3/uL (ref 145–400)
RBC: 4.11 10*6/uL (ref 3.70–5.45)
RDW: 15.4 % — AB (ref 11.2–14.5)
WBC: 8.9 10*3/uL (ref 3.9–10.3)

## 2015-01-16 LAB — COMPREHENSIVE METABOLIC PANEL (CC13)
ALT: 19 U/L (ref 0–55)
ANION GAP: 12 meq/L — AB (ref 3–11)
AST: 20 U/L (ref 5–34)
Albumin: 4 g/dL (ref 3.5–5.0)
Alkaline Phosphatase: 74 U/L (ref 40–150)
BUN: 17.2 mg/dL (ref 7.0–26.0)
CALCIUM: 9.3 mg/dL (ref 8.4–10.4)
CHLORIDE: 103 meq/L (ref 98–109)
CO2: 24 mEq/L (ref 22–29)
Creatinine: 0.8 mg/dL (ref 0.6–1.1)
EGFR: 79 mL/min/{1.73_m2} — AB (ref >90)
GLUCOSE: 96 mg/dL (ref 70–140)
Potassium: 4.1 mEq/L (ref 3.5–5.1)
Sodium: 139 mEq/L (ref 136–145)
Total Bilirubin: 0.42 mg/dL (ref 0.20–1.20)
Total Protein: 6.8 g/dL (ref 6.4–8.3)

## 2015-01-16 MED ORDER — DOCETAXEL CHEMO INJECTION 160 MG/16ML
75.0000 mg/m2 | Freq: Once | INTRAVENOUS | Status: AC
  Administered 2015-01-16: 130 mg via INTRAVENOUS
  Filled 2015-01-16: qty 13

## 2015-01-16 MED ORDER — ACETAMINOPHEN 325 MG PO TABS
ORAL_TABLET | ORAL | Status: AC
  Filled 2015-01-16: qty 2

## 2015-01-16 MED ORDER — SODIUM CHLORIDE 0.9 % IV SOLN
Freq: Once | INTRAVENOUS | Status: AC
  Administered 2015-01-16: 10:00:00 via INTRAVENOUS

## 2015-01-16 MED ORDER — SODIUM CHLORIDE 0.9 % IJ SOLN
10.0000 mL | INTRAMUSCULAR | Status: DC | PRN
  Administered 2015-01-16: 10 mL
  Filled 2015-01-16: qty 10

## 2015-01-16 MED ORDER — SODIUM CHLORIDE 0.9 % IV SOLN
Freq: Once | INTRAVENOUS | Status: AC
  Administered 2015-01-16: 14:00:00 via INTRAVENOUS
  Filled 2015-01-16: qty 8

## 2015-01-16 MED ORDER — HEPARIN SOD (PORK) LOCK FLUSH 100 UNIT/ML IV SOLN
500.0000 [IU] | Freq: Once | INTRAVENOUS | Status: AC | PRN
  Administered 2015-01-16: 500 [IU]
  Filled 2015-01-16: qty 5

## 2015-01-16 MED ORDER — SODIUM CHLORIDE 0.9 % IV SOLN
840.0000 mg | Freq: Once | INTRAVENOUS | Status: AC
  Administered 2015-01-16: 840 mg via INTRAVENOUS
  Filled 2015-01-16: qty 28

## 2015-01-16 MED ORDER — ACETAMINOPHEN 325 MG PO TABS
650.0000 mg | ORAL_TABLET | Freq: Once | ORAL | Status: AC
  Administered 2015-01-16: 650 mg via ORAL

## 2015-01-16 MED ORDER — TRASTUZUMAB CHEMO INJECTION 440 MG
8.0000 mg/kg | Freq: Once | INTRAVENOUS | Status: AC
  Administered 2015-01-16: 525 mg via INTRAVENOUS
  Filled 2015-01-16: qty 25

## 2015-01-16 MED ORDER — PEGFILGRASTIM 6 MG/0.6ML ~~LOC~~ PSKT
6.0000 mg | PREFILLED_SYRINGE | Freq: Once | SUBCUTANEOUS | Status: AC
Start: 2015-01-16 — End: 2015-01-16
  Administered 2015-01-16: 6 mg via SUBCUTANEOUS
  Filled 2015-01-16: qty 0.6

## 2015-01-16 MED ORDER — CARBOPLATIN CHEMO INJECTION 600 MG/60ML
508.0000 mg | Freq: Once | INTRAVENOUS | Status: AC
  Administered 2015-01-16: 510 mg via INTRAVENOUS
  Filled 2015-01-16: qty 51

## 2015-01-16 MED ORDER — DIPHENHYDRAMINE HCL 25 MG PO CAPS
ORAL_CAPSULE | ORAL | Status: AC
  Filled 2015-01-16: qty 2

## 2015-01-16 MED ORDER — DIPHENHYDRAMINE HCL 25 MG PO CAPS
25.0000 mg | ORAL_CAPSULE | Freq: Once | ORAL | Status: AC
  Administered 2015-01-16: 25 mg via ORAL

## 2015-01-16 NOTE — Patient Instructions (Signed)
[facility] Discharge Instructions for Patients Receiving Chemotherapy  Today you received the following chemotherapy agents Carboplatin,Taxotere, Herceptin, Perjecta  To help prevent nausea and vomiting after your treatment, we encourage you to take your nausea medication Zofran and Compazine as directed   If you develop nausea and vomiting that is not controlled by your nausea medication, call the clinic.   BELOW ARE SYMPTOMS THAT SHOULD BE REPORTED IMMEDIATELY:  *FEVER GREATER THAN 100.5 F  *CHILLS WITH OR WITHOUT FEVER  NAUSEA AND VOMITING THAT IS NOT CONTROLLED WITH YOUR NAUSEA MEDICATION  *UNUSUAL SHORTNESS OF BREATH  *UNUSUAL BRUISING OR BLEEDING  TENDERNESS IN MOUTH AND THROAT WITH OR WITHOUT PRESENCE OF ULCERS  *URINARY PROBLEMS  *BOWEL PROBLEMS  UNUSUAL RASH Items with * indicate a potential emergency and should be followed up as soon as possible.  Feel free to call the clinic you have any questions or concerns. The clinic phone number is [phone].  Please show the [facility] at check-in to the Emergency Department and triage nurse.

## 2015-01-16 NOTE — Progress Notes (Signed)
Met with pt during 1st chemotherapy treatment. Pt denies needs at this time. Encourage pt to call with questions or concerns. Received verbal understanding.

## 2015-01-17 ENCOUNTER — Telehealth: Payer: Self-pay | Admitting: *Deleted

## 2015-01-17 NOTE — Telephone Encounter (Signed)
Patient has no complaints/issues. Instructed patient to call [facility] with if any problems arise. Patient verbalized understanding.

## 2015-01-17 NOTE — Telephone Encounter (Signed)
-----   Message from Cora Collum, RN sent at 01/16/2015 10:50 AM EDT ----- Regarding: Follow up chemo call 1st time Herceptin,Perjeta, Taxotere, Carboplatin Dr. Jana Hakim [phone]

## 2015-01-18 ENCOUNTER — Ambulatory Visit: Payer: BLUE CROSS/BLUE SHIELD

## 2015-01-20 ENCOUNTER — Telehealth: Payer: Self-pay | Admitting: *Deleted

## 2015-01-20 ENCOUNTER — Other Ambulatory Visit: Payer: Self-pay | Admitting: *Deleted

## 2015-01-20 MED ORDER — OMEPRAZOLE 40 MG PO CPDR: 40.0000 mg | DELAYED_RELEASE_CAPSULE | Freq: Every day | ORAL | Status: DC

## 2015-01-20 NOTE — Telephone Encounter (Signed)
TC from pt. She states that she has experienced some burning pain at the base of her throat last night. Relieved with numerous Tums/Rolaids.  Pt. Wants to know if she should be concerned and if she needs to take some other type of medication for this. She is currently being treated with Taxotere/Carbo/Hereceptin/Perjeta. Last treatment was 01/16/15.  Pt. Has new port as of 1 week ago and is experiencing some tenderness at collar bone area. Instructed to apply ice to area as needed and prn Acetominophen. Please Advise.

## 2015-01-20 NOTE — Telephone Encounter (Signed)
Called pt to follow-up about the burning pain she is experiencing in her throat. No answer but left a detailed message for pt to call this nurse back@ [phone]. I have sent a Rx for Omeprazole 40 mg to her pharmacy. Disp-30, R-0. I will instruct pt how she will take this medication when she calls back.

## 2015-01-23 ENCOUNTER — Ambulatory Visit: Payer: BLUE CROSS/BLUE SHIELD | Admitting: Nurse Practitioner

## 2015-01-23 ENCOUNTER — Other Ambulatory Visit: Payer: Self-pay | Admitting: *Deleted

## 2015-01-23 ENCOUNTER — Other Ambulatory Visit: Payer: BLUE CROSS/BLUE SHIELD

## 2015-01-23 ENCOUNTER — Telehealth: Payer: Self-pay | Admitting: *Deleted

## 2015-01-23 MED ORDER — FIRST-DUKES MOUTHWASH MT SUSP: OROMUCOSAL | Status: DC

## 2015-01-23 NOTE — Telephone Encounter (Signed)
Patient states that she noticed today a bump between two teeth. Patient would like to go to the dentist to get this issue looked at but would like to confirm it with MD. Patient denies any pain, but soreness with flossing. Message sent to MD Magrinat and RN Marlon Pel.

## 2015-01-23 NOTE — Telephone Encounter (Signed)
This RN spoke with pt per her call with concern for dental need.  Note pt had called over the weekend with complaints of severe sore throat with swallowing- without fever.  At present pt states "throat is overall better with some mild discomfort ".  Bump between teeth is located beside molars with pt noticing post flossing this am. Jaw is not sore nor swollen.  This RN discussed with pt probable mouth/gum changes due to chemo.  Per further inquiry pt denies any areas that are white but " just areas that are sore ".  Plan at present is to institute magic mouth wash.  Per conversation pt will avoid flossing and may use a water pic at low setting.  Pt has biotin as well and will use it for additional benefit.

## 2015-01-24 ENCOUNTER — Telehealth: Payer: Self-pay | Admitting: *Deleted

## 2015-01-24 NOTE — Telephone Encounter (Signed)
Pt called and left her phone number on triage voice mail.  Called pt back unsuccessfully.  Left message on pt's voice mail indicating that triage nurse had returned pt's call.  Left message confirming of pt's appts for lab and to see Nira Conn, NP on 01/25/15. Pt's  Phone   [phone].

## 2015-01-24 NOTE — Telephone Encounter (Signed)
Pt returned triage nurse call.  Pt stated she went to see her dentist and was given Kenalog ointment to apply on the gum.  Pt denied gum bleeding.   Her dentist did not think any infection going on.  Informed pt that message will be sent to [facility], NP for update information.  Pt did confirm that she received message of her appts on 01/25/15.

## 2015-01-25 ENCOUNTER — Encounter: Payer: Self-pay | Admitting: *Deleted

## 2015-01-25 ENCOUNTER — Encounter: Payer: Self-pay | Admitting: Nurse Practitioner

## 2015-01-25 ENCOUNTER — Ambulatory Visit (HOSPITAL_BASED_OUTPATIENT_CLINIC_OR_DEPARTMENT_OTHER): Payer: BLUE CROSS/BLUE SHIELD | Admitting: Nurse Practitioner

## 2015-01-25 ENCOUNTER — Other Ambulatory Visit (HOSPITAL_BASED_OUTPATIENT_CLINIC_OR_DEPARTMENT_OTHER): Payer: BLUE CROSS/BLUE SHIELD

## 2015-01-25 VITALS — BP 131/64 | HR 84 | Temp 98.9°F | Resp 18 | Ht 68.0 in | Wt 138.8 lb

## 2015-01-25 DIAGNOSIS — T451X5A Adverse effect of antineoplastic and immunosuppressive drugs, initial encounter: Secondary | ICD-10-CM

## 2015-01-25 DIAGNOSIS — G4700 Insomnia, unspecified: Secondary | ICD-10-CM | POA: Diagnosis not present

## 2015-01-25 DIAGNOSIS — C50412 Malignant neoplasm of upper-outer quadrant of left female breast: Secondary | ICD-10-CM

## 2015-01-25 DIAGNOSIS — H538 Other visual disturbances: Secondary | ICD-10-CM

## 2015-01-25 DIAGNOSIS — K1231 Oral mucositis (ulcerative) due to antineoplastic therapy: Secondary | ICD-10-CM

## 2015-01-25 DIAGNOSIS — R232 Flushing: Secondary | ICD-10-CM | POA: Insufficient documentation

## 2015-01-25 DIAGNOSIS — Z91048 Other nonmedicinal substance allergy status: Secondary | ICD-10-CM

## 2015-01-25 DIAGNOSIS — K521 Toxic gastroenteritis and colitis: Secondary | ICD-10-CM | POA: Insufficient documentation

## 2015-01-25 LAB — COMPREHENSIVE METABOLIC PANEL (CC13)
ALBUMIN: 3.9 g/dL (ref 3.5–5.0)
ALT: 23 U/L (ref 0–55)
ANION GAP: 8 meq/L (ref 3–11)
AST: 23 U/L (ref 5–34)
Alkaline Phosphatase: 129 U/L (ref 40–150)
BUN: 8.7 mg/dL (ref 7.0–26.0)
CALCIUM: 9.1 mg/dL (ref 8.4–10.4)
CHLORIDE: 105 meq/L (ref 98–109)
CO2: 27 mEq/L (ref 22–29)
Creatinine: 0.8 mg/dL (ref 0.6–1.1)
EGFR: 81 mL/min/{1.73_m2} — ABNORMAL LOW (ref >90)
Glucose: 107 mg/dl (ref 70–140)
POTASSIUM: 3.8 meq/L (ref 3.5–5.1)
Sodium: 140 mEq/L (ref 136–145)
TOTAL PROTEIN: 6.4 g/dL (ref 6.4–8.3)
Total Bilirubin: <0.2 mg/dL (ref 0.20–1.20)

## 2015-01-25 LAB — CBC WITH DIFFERENTIAL/PLATELET
BASO%: 0.4 % (ref 0.0–2.0)
Basophils Absolute: 0.2 10*3/uL — ABNORMAL HIGH (ref 0.0–0.1)
EOS%: 0 % (ref 0.0–7.0)
Eosinophils Absolute: 0 10*3/uL (ref 0.0–0.5)
HCT: 36.7 % (ref 34.8–46.6)
HGB: 12.1 g/dL (ref 11.6–15.9)
LYMPH%: 3.5 % — AB (ref 14.0–49.7)
MCH: 30.5 pg (ref 25.1–34.0)
MCHC: 33 g/dL (ref 31.5–36.0)
MCV: 92.4 fL (ref 79.5–101.0)
MONO#: 2.2 10*3/uL — AB (ref 0.1–0.9)
MONO%: 5.7 % (ref 0.0–14.0)
NEUT#: 35.7 10*3/uL — ABNORMAL HIGH (ref 1.5–6.5)
NEUT%: 90.4 % — AB (ref 38.4–76.8)
PLATELETS: 144 10*3/uL — AB (ref 145–400)
RBC: 3.97 10*6/uL (ref 3.70–5.45)
RDW: 14.8 % — ABNORMAL HIGH (ref 11.2–14.5)
WBC: 39.5 10*3/uL — AB (ref 3.9–10.3)
lymph#: 1.4 10*3/uL (ref 0.9–3.3)

## 2015-01-25 NOTE — Progress Notes (Unsigned)
Met with pt during nadir check. Relate doing well. Denies needs at this time. Discussed anti-nausea medications and working with her to develop a routine that best works for her. Encourage pt to call with questions or concerns. Gave emotional support and encouragement.

## 2015-01-25 NOTE — Progress Notes (Signed)
[place]  Telephone:(336) [phone] Fax:(336) [phone]     ID: Rhonda Gardner DOB: December 14, 1955  MR#: [id number]  BPZ#:[id number]  Patient Care Team: Rita Ohara, MD as PCP - General (Family Medicine) Erroll Luna, MD as Consulting Physician (General Surgery) Chauncey Cruel, MD as Consulting Physician (Oncology) Thea Silversmith, MD as Consulting Physician (Radiation Oncology) Mauro Kaufmann, RN as Registered Nurse Rockwell Germany, RN as Registered Nurse Holley Bouche, NP as Nurse Practitioner (Nurse Practitioner) PCP: Vikki Ports, MD OTHER MD:  CHIEF COMPLAINT: Triple negative breast cancer  CURRENT TREATMENT: Awaiting neoadjuvant chemotherapy   BREAST CANCER HISTORY: From the original intake note:  "Rhonda Gardner" had not had a physical exam in quite some time, and had not had a mammogram since 2020, when she was evaluated by Dr. Tomi Bamberger 12/01/2014. Dr. Tomi Bamberger was able to palpate a 2 cm firm mass at the 3:00 position in the left breast. The patient herself has been aware of multiple breast masses and does have a history of fibrocystic change, with multiple prior benign biopsies. She did not feel this particular mass had changed recently.   Dr. Tomi Bamberger arranged for bilateral diagnostic mammography and left breast ultrasonography at the [facility] Center 12/15/2014. This shows the breast density to be category C. In the left breast upper outer quadrant there was a bilobed mass measuring approximately 2 cm. This was palpable. Ultrasound confirmed an irregular microlobulated and hypoechoic mass measuring 2.1 cm at the 2:00 location 6 cm from the nipple. There was no left axillary adenopathy.  Biopsy of the mass in question 12/19/2014 showed ([id number]) invasive ductal carcinoma, grade 2 or 3, estrogen and progesterone receptor negative, with an MIB-1 of 38%, and equivocal HER-2 amplification, the signals ratio being 1.57, the number per cell 4.25.  On 12/27/2014 the patient  underwent bilateral breast MRI. The mass in question at the 2:00 location of the left breast measured 2.5 cm. It directly abuts the left pectoralis major. There is no enhancement in the muscle itself. There were no abnormal appearing lymph nodes. There was an incidentally noted 1 cm hepatic cyst at the dome of the right liver lobe.  Her subsequent history is as detailed below  INTERVAL HISTORY: Rhonda Gardner returns today for follow up of her breast cancer, accompanied by her husband Rhonda Gardner. Today is day 10, cycle 1 of carboplatin, docetaxel, trastuzumab and pertuzumab given every 3 weeks, with neulasta given on day 2 for granulocyte support.   REVIEW OF SYSTEMS: Rhonda Gardner has several complaints today. Her skin broke out from the 2 places tape was applied last week. She is likely allergic to adhesives. She had facial flushing as well. Her mouth sores were managed with magic mouth wash, and also helped the irritation to her throat, initially believed to be reflux. She visited her dentist for left lower gum swelling and she was given a topical dental steroid, but she has not used this yet. Up until yesterday she had 2-3 loose bowel movement daily, but she did not want to take anything for this. Her appetite is decreased, secondary to taste changes. She had mild nausea. Her vision was blurred. She does not sleep well. She had neulasta related bone pain, but doesn't remember how much claratin she took. Her nose is dry and there was some bleeding. She denies fevers or chills. A detailed review of systems is otherwise stable.  PAST MEDICAL HISTORY: Past Medical History  Diagnosis Date  . Allergy 2006    chemical cauterization in  spring 2006, and 2nd treatment with good results.(Dr.Shoemaker)  . Bipolar disorder   . Breast cancer 12/2014    invasive ductal carcinoma (LEFT)  . Breast cancer of upper-outer quadrant of left female breast 12/22/2014    PAST SURGICAL HISTORY: Past Surgical History  Procedure Laterality  Date  . Breast biopsy Bilateral     benign and a right stereotatic breast biopsy.  . Foot surgery Left 2014    Dr. Paulla Dolly  . Fibroid excision  age 28    prolapsed fibroid removed    FAMILY HISTORY Family History  Problem Relation Age of Onset  . Cancer Father     skin - non melanoma - farmer  . Heart disease Father   . Hypertension Mother   . Arthritis Mother     osteoarthritis  . Colon cancer Mother   . Macular degeneration Mother     wet and dry  . Cancer Mother 36    colon cancer at age 35  . Osteoporosis Sister     osteopenia  . Cancer Maternal Aunt     lung cancer  . Diabetes Paternal Aunt   . Cancer Paternal Aunt 43    ? stomach cancer  . Autism Sister   . Mental retardation Sister   . Blindness Sister     secondary to retrolental fibroplasia  . Goiter Paternal Aunt    the patient's father lived to be 95. He had a history of nonmelanoma skin cancers. [name] mother is living at age 74. She was diagnosed with colon cancer in her 13s. A paternal aunt may have had stomach cancer. Maternal aunt developed lung cancer in her 23s. The patient has one brother, 2 sisters. One of her sisters is blind and mentally retarded. There is no history of breast or ovarian cancer in the family.  GYNECOLOGIC HISTORY:  No LMP recorded. Patient is postmenopausal. Menarche age 70. The patient is GX P0. She stopped having periods in her early 82s. She did not take hormone replacement. She took oral contraceptives remotely for over 5 years with no complications.  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 Her husband Rhonda Gardner worked in IT originally but now runs a family firm. It's just the 2 of them at home, plus a chocolate lab.    ADVANCED DIRECTIVES: In place   HEALTH MAINTENANCE: History  Substance Use Topics  . Smoking status: Never Smoker   . Smokeless tobacco: Never Used  . Alcohol Use: No     Colonoscopy: In [place] under Dr. Lavina Hamman, date not entirely  clear  PAP: February 2016  Bone density: Under Sander Radon, results not currently available  Lipid panel:  No Known Allergies  Current Outpatient Prescriptions  Medication Sig Dispense Refill  . Cholecalciferol (VITAMIN D) 2000 UNITS tablet Take 2,000 Units by mouth daily.    . Cyanocobalamin (VITAMIN B-12) 5000 MCG SUBL Place 5,000 mcg under the tongue daily.    Marland Kitchen dexamethasone (DECADRON) 4 MG tablet Take 2 tablets (8 mg total) by mouth 2 (two) times daily. Start the day before Taxotere. Then again the day after chemo for 3 days. 30 tablet 1  . Diphenhyd-Hydrocort-Nystatin (FIRST-DUKES MOUTHWASH) SUSP 5-10 ml QID prn swish swallow or spit 240 mL 3  . LAMICTAL 200 MG tablet Take 0.5 tablets (100 mg total) by mouth daily. 90 tablet 0  . lidocaine-prilocaine (EMLA) cream Apply to affected area once 30 g 3  . loratadine (CLARITIN) 10 MG tablet Take one tablet on  the day of neulasta shot, and repeat the next 2 days 100 tablet 3  . LORazepam (ATIVAN) 0.5 MG tablet Take 1 tablet (0.5 mg total) by mouth at bedtime as needed (Nausea or vomiting). 30 tablet 0  . LUTEIN-ZEAXANTHIN PO Take 1 tablet by mouth daily.    . Multiple Vitamins-Minerals (PRESERVISION AREDS) CAPS Take 1 capsule by mouth 2 (two) times daily.    . naproxen sodium (ANAPROX) 220 MG tablet Take 220-440 mg by mouth daily as needed (pain). Aleve    . ondansetron (ZOFRAN) 8 MG tablet Take 1 tablet (8 mg total) by mouth 2 (two) times daily. Start the day after chemo for 3 days. Then take as needed for nausea or vomiting. 30 tablet 1  . prochlorperazine (COMPAZINE) 10 MG tablet Take 1 tablet (10 mg total) by mouth every 6 (six) hours as needed (Nausea or vomiting). 30 tablet 1  . tobramycin-dexamethasone (TOBRADEX) ophthalmic solution Place 1 drop into both eyes 2 (two) times daily. 5 mL 0  . MAGNESIUM CITRATE PO Take 250 mg by mouth daily.    Marland Kitchen omeprazole (PRILOSEC) 40 MG capsule Take 1 capsule (40 mg total) by mouth daily. (Patient  not taking: Reported on 01/25/2015) 30 capsule 2   No current facility-administered medications for this visit.    OBJECTIVE: Middle-aged white woman in no acute distress  Filed Vitals:   01/25/15 0844  BP: 131/64  Pulse: 84  Temp: 98.9 F (37.2 C)  Resp: 18     Body mass index is 21.11 kg/(m^2).    ECOG FS:0 - Asymptomatic   Skin: erythematous rash to left chest and left antecubital  HEENT: sclerae anicteric, conjunctivae pink. Swelling to lower left canine. Singular mouth sore to back of mouth. Tongue normal Lymph Nodes: No cervical or supraclavicular lymphadenopathy  Lungs: clear to auscultation bilaterally, no rales, wheezes, or rhonci  Heart: regular rate and rhythm  Abdomen: round, soft, non tender, positive bowel sounds  Musculoskeletal: No focal spinal tenderness, no peripheral edema  Neuro: non focal, well oriented, positive affect  Breasts: deferred  LAB RESULTS:  CMP     Component Value Date/Time   NA 140 01/25/2015 0811   NA 142 01/13/2015 0822   K 3.8 01/25/2015 0811   K 3.8 01/13/2015 0822   CL 106 01/13/2015 0822   CO2 27 01/25/2015 0811   CO2 26 01/13/2015 0822   GLUCOSE 107 01/25/2015 0811   GLUCOSE 83 01/13/2015 0822   BUN 8.7 01/25/2015 0811   BUN 8 01/13/2015 0822   CREATININE 0.8 01/25/2015 0811   CREATININE 0.77 01/13/2015 0822   CREATININE 0.80 12/01/2014 1437   CALCIUM 9.1 01/25/2015 0811   CALCIUM 9.1 01/13/2015 0822   PROT 6.4 01/25/2015 0811   PROT 7.0 12/01/2014 1437   ALBUMIN 3.9 01/25/2015 0811   ALBUMIN 4.8 12/01/2014 1437   AST 23 01/25/2015 0811   AST 28 12/01/2014 1437   ALT 23 01/25/2015 0811   ALT 24 12/01/2014 1437   ALKPHOS 129 01/25/2015 0811   ALKPHOS 65 12/01/2014 1437   BILITOT <0.20 01/25/2015 0811   BILITOT 0.6 12/01/2014 1437   GFRNONAA 89* 01/13/2015 0822   GFRAA >90 01/13/2015 0822    INo results found for: SPEP, UPEP  Lab Results  Component Value Date   WBC 39.5* 01/25/2015   NEUTROABS 35.7* 01/25/2015     HGB 12.1 01/25/2015   HCT 36.7 01/25/2015   MCV 92.4 01/25/2015   PLT 144* 01/25/2015  Chemistry      Component Value Date/Time   NA 140 01/25/2015 0811   NA 142 01/13/2015 0822   K 3.8 01/25/2015 0811   K 3.8 01/13/2015 0822   CL 106 01/13/2015 0822   CO2 27 01/25/2015 0811   CO2 26 01/13/2015 0822   BUN 8.7 01/25/2015 0811   BUN 8 01/13/2015 0822   CREATININE 0.8 01/25/2015 0811   CREATININE 0.77 01/13/2015 0822   CREATININE 0.80 12/01/2014 1437      Component Value Date/Time   CALCIUM 9.1 01/25/2015 0811   CALCIUM 9.1 01/13/2015 0822   ALKPHOS 129 01/25/2015 0811   ALKPHOS 65 12/01/2014 1437   AST 23 01/25/2015 0811   AST 28 12/01/2014 1437   ALT 23 01/25/2015 0811   ALT 24 12/01/2014 1437   BILITOT <0.20 01/25/2015 0811   BILITOT 0.6 12/01/2014 1437       No results found for: LABCA2  No components found for: [id number]  No results for input(s): INR in the last 168 hours.  Urinalysis    Component Value Date/Time   LABSPEC 1.010 08/09/2007 2121   PHURINE 7.5 08/09/2007 2121   GLUCOSEU NEGATIVE 08/09/2007 2121   HGBUR SMALL* 08/09/2007 2121   BILIRUBINUR neg 12/01/2014 1358   BILIRUBINUR NEGATIVE 08/09/2007 2121   KETONESUR NEGATIVE 08/09/2007 2121   PROTEINUR neg 12/01/2014 1358   PROTEINUR NEGATIVE 08/09/2007 2121   UROBILINOGEN negative 12/01/2014 1358   UROBILINOGEN 0.2 08/09/2007 2121   NITRITE neg 12/01/2014 1358   NITRITE NEGATIVE 08/09/2007 2121   LEUKOCYTESUR Negative 12/01/2014 1358    STUDIES: Mr Breast Bilateral W Wo Contrast  12/27/2014   CLINICAL DATA:  Newly diagnosed left breast 2 o'clock location invasive ductal carcinoma. Preoperative MRI.  LABS:  Not performed  EXAM: BILATERAL BREAST MRI WITH AND WITHOUT CONTRAST  TECHNIQUE: Multiplanar, multisequence MR images of both breasts were obtained prior to and following the intravenous administration of 13 ml of MultiHance.  THREE-DIMENSIONAL MR IMAGE RENDERING ON INDEPENDENT  WORKSTATION:  Three-dimensional MR images were rendered by post-processing of the original MR data on an independent workstation. The three-dimensional MR images were interpreted, and findings are reported in the following complete MRI report for this study. Three dimensional images were evaluated at the independent DynaCad workstation  COMPARISON:  Prior mammograms and left breast ultrasound  FINDINGS: Breast composition: c.  Heterogeneous fibroglandular tissue.  Background parenchymal enhancement: Minimal  Right breast: No mass or abnormal enhancement.  Left breast: In the left breast 2 o'clock location posterior depth is a bilobed enhancing mass with adjacent clip artifact measuring overall 2.5 x 1.6 x 1.5 cm, demonstrating wash-in type enhancement kinetics. This corresponds to the biopsy-proven malignancy. This directly abuts the subjacent left pectoralis major muscle and superficial involvement is not excluded although there is no enhancement within the left pectoralis major muscle or underlying chest wall structures.  Lymph nodes: No abnormal appearing lymph nodes.  Ancillary findings: 1 cm T2 hyperintense hepatic cyst incidentally noted at the dome of the right hepatic lobe.  IMPRESSION: 2.5 cm enhancing left breast 2 o'clock location mass compatible with biopsy-proven malignancy. This directly abuts the left pectoralis major muscle and superficial muscle involvement is possible although there is no abnormal intramuscular enhancement or enhancement of the underlying chest wall structures.  No MRI evidence for contralateral or multifocal/ multicentric malignancy.  RECOMMENDATION: Treatment plan  BI-RADS CATEGORY  6: Known biopsy-proven malignancy.   Electronically Signed   By: Conchita Paris M.D.   On: 12/27/2014  11:59   Ir Fluoro Guide Cv Line Right  01/13/2015   CLINICAL DATA:  Left sided breast cancer  EXAM: TUNNEL POWER PORT PLACEMENT WITH SUBCUTANEOUS POCKET UTILIZING ULTRASOUND & FLOUROSCOPY   FLUOROSCOPY TIME:  24 seconds.  MEDICATIONS AND MEDICAL HISTORY: Versed 1.5 mg, Fentanyl 75 mcg.  As antibiotic prophylaxis, Ancef was ordered pre-procedure and administered intravenously within one hour of incision.  ANESTHESIA/SEDATION: Moderate sedation time: 30 minutes  CONTRAST:  None  PROCEDURE: After written informed consent was obtained, patient was placed in the supine position on angiographic table. The right neck and chest was prepped and draped in a sterile fashion. Lidocaine was utilized for local anesthesia. The right internal jugular vein was noted to be patent initially with ultrasound. Under sonographic guidance, a micropuncture needle was inserted into the right IJ vein (Ultrasound and fluoroscopic image documentation was performed). The needle was removed over an 018 wire which was exchanged for a Amplatz. This was advanced into the IVC. An 8-French dilator was advanced over the Amplatz.  A small incision was made in the right upper chest over the anterior right second rib. Utilizing blunt dissection, a subcutaneous pocket was created in the caudal direction. The pocket was irrigated with a copious amount of sterile normal saline. The port catheter was tunneled from the chest incision, and out the neck incision. The reservoir was inserted into the subcutaneous pocket and secured with two 3-0 Ethilon stitches. A peel-away sheath was advanced over the Amplatz wire. The port catheter was cut to measure length and inserted through the peel-away sheath. The peel-away sheath was removed. The chest incision was closed with 3-0 Vicryl interrupted stitches for the subcutaneous tissue and a running of 4-0 Vicryl subcuticular stitch for the skin. The neck incision was closed with a 4-0 Vicryl subcuticular stitch. Derma-bond was applied to both surgical incisions. The port reservoir was flushed and instilled with heparinized saline. No complications.  FINDINGS: A right IJ vein Port-A-Cath is in place with its  tip at the cavoatrial junction.  COMPLICATIONS: None  IMPRESSION: Successful 8 French right internal jugular vein power port placement with its tip at the SVC/RA junction.   Electronically Signed   By: Marybelle Killings M.D.   On: 01/13/2015 10:22   Ir US Guide Vasc Access Right  01/13/2015   CLINICAL DATA:  Left sided breast cancer  EXAM: TUNNEL POWER PORT PLACEMENT WITH SUBCUTANEOUS POCKET UTILIZING ULTRASOUND & FLOUROSCOPY  FLUOROSCOPY TIME:  24 seconds.  MEDICATIONS AND MEDICAL HISTORY: Versed 1.5 mg, Fentanyl 75 mcg.  As antibiotic prophylaxis, Ancef was ordered pre-procedure and administered intravenously within one hour of incision.  ANESTHESIA/SEDATION: Moderate sedation time: 30 minutes  CONTRAST:  None  PROCEDURE: After written informed consent was obtained, patient was placed in the supine position on angiographic table. The right neck and chest was prepped and draped in a sterile fashion. Lidocaine was utilized for local anesthesia. The right internal jugular vein was noted to be patent initially with ultrasound. Under sonographic guidance, a micropuncture needle was inserted into the right IJ vein (Ultrasound and fluoroscopic image documentation was performed). The needle was removed over an 018 wire which was exchanged for a Amplatz. This was advanced into the IVC. An 8-French dilator was advanced over the Amplatz.  A small incision was made in the right upper chest over the anterior right second rib. Utilizing blunt dissection, a subcutaneous pocket was created in the caudal direction. The pocket was irrigated with a copious amount of sterile normal saline.  The port catheter was tunneled from the chest incision, and out the neck incision. The reservoir was inserted into the subcutaneous pocket and secured with two 3-0 Ethilon stitches. A peel-away sheath was advanced over the Amplatz wire. The port catheter was cut to measure length and inserted through the peel-away sheath. The peel-away sheath was  removed. The chest incision was closed with 3-0 Vicryl interrupted stitches for the subcutaneous tissue and a running of 4-0 Vicryl subcuticular stitch for the skin. The neck incision was closed with a 4-0 Vicryl subcuticular stitch. Derma-bond was applied to both surgical incisions. The port reservoir was flushed and instilled with heparinized saline. No complications.  FINDINGS: A right IJ vein Port-A-Cath is in place with its tip at the cavoatrial junction.  COMPLICATIONS: None  IMPRESSION: Successful 8 French right internal jugular vein power port placement with its tip at the SVC/RA junction.   Electronically Signed   By: Marybelle Killings M.D.   On: 01/13/2015 10:22    ASSESSMENT: 60 y.o. [place] woman s/p left breast upper outer quadrant biopsy 12/19/2014 for a clinical T2 N0, stage IIA invasive ductal carcinoma, grade 2 or 3, estrogen and progesterone receptor negative, with an MIB-1 of 38%, HER-2 equivocal initially but positive on further testing with a ratio of 3.4  (1) neoadjuvant chemotherapy to consist of carboplatin, docetaxel, trastuzumab and pertuzumab given every 3 weeks for 6 cycles with Neulasta support  (2) definitive surgery to follow chemotherapy  (3) radiation to follow surgery  (4) genetics counselor felt the risk of a familial mutation was sufficiently low testing could be omitted  PLAN: We spent approximately 30 minutes discussing Paula's various side effects. We have added tape to her list of allergies, and she will now use coban wraps and IV 3000 for any adhesive needs moving forward. Most of all I think she is sensitive to the dexamethasone, which would be causing the blurred eyes, facial flushing, and insomnia. She managed her nausea and diarrhea on her own. I advised that she begin taking the ativan on the days she is on steroids to sleep better. She will not become "addicted" with this frequency of use. In addition she will use claratin for a day or two more than she has  this past cycle for her neulasta bone pain. She will use the kenalog ointment prescribed by her dentist to the area of gum swelling she has experienced, and magic mouth wash for her other sores. I have also advised she use saline nasal spray for her sinus dryness.  Rhonda Gardner will return in 2 weeks for the start of cycle 2. She understands and agrees with this plan. She knows the goal of treatment in her case is cure. She has been encouraged to all with any issues that might arise before her next visit here.   Laurie Panda, NP   01/25/2015 9:33 AM

## 2015-01-27 ENCOUNTER — Telehealth: Payer: Self-pay | Admitting: *Deleted

## 2015-01-27 ENCOUNTER — Encounter: Payer: Self-pay | Admitting: Nurse Practitioner

## 2015-01-27 ENCOUNTER — Ambulatory Visit (HOSPITAL_BASED_OUTPATIENT_CLINIC_OR_DEPARTMENT_OTHER): Payer: BLUE CROSS/BLUE SHIELD | Admitting: Nurse Practitioner

## 2015-01-27 ENCOUNTER — Telehealth: Payer: Self-pay | Admitting: Nurse Practitioner

## 2015-01-27 ENCOUNTER — Telehealth: Payer: Self-pay | Admitting: Medical Oncology

## 2015-01-27 ENCOUNTER — Ambulatory Visit: Payer: BLUE CROSS/BLUE SHIELD

## 2015-01-27 VITALS — BP 115/73 | HR 84 | Temp 98.4°F | Resp 18 | Wt 139.0 lb

## 2015-01-27 DIAGNOSIS — J069 Acute upper respiratory infection, unspecified: Secondary | ICD-10-CM | POA: Diagnosis not present

## 2015-01-27 DIAGNOSIS — C50412 Malignant neoplasm of upper-outer quadrant of left female breast: Secondary | ICD-10-CM

## 2015-01-27 DIAGNOSIS — K1231 Oral mucositis (ulcerative) due to antineoplastic therapy: Secondary | ICD-10-CM | POA: Diagnosis not present

## 2015-01-27 DIAGNOSIS — Z91048 Other nonmedicinal substance allergy status: Secondary | ICD-10-CM

## 2015-01-27 MED ORDER — MAGIC MOUTHWASH W/LIDOCAINE
5.0000 mL | Freq: Four times a day (QID) | ORAL | Status: DC | PRN
Start: 2015-01-27 — End: 2015-05-19

## 2015-01-27 MED ORDER — AMOXICILLIN-POT CLAVULANATE 875-125 MG PO TABS: 1.0000 | ORAL_TABLET | Freq: Two times a day (BID) | ORAL | Status: DC

## 2015-01-27 NOTE — Telephone Encounter (Signed)
Taxotere ,carbo  neulasta 12 days ago. "Miserable sore throat , low grade fever " Using magic mouthwash -without lidocaine. . Salt water rinses, cloraseptic spray ( I told her to stop that) . appt with Selena Lesser

## 2015-01-27 NOTE — Telephone Encounter (Signed)
added appt per pof....per pof pt aware. added at 11:15 so lunch is not disruppted

## 2015-01-27 NOTE — Assessment & Plan Note (Signed)
Patient has an apparent adhesive allergy.  She has healing rash and skin irritation to both her left elbow region in her right neck area from previous tape sites.  No evidence of active infection noted.  Have placed adhesive tape on allergy list.

## 2015-01-27 NOTE — Assessment & Plan Note (Signed)
Patient received her initial cycle of neoadjuvant carboplatin/docetaxel/Herceptin/Perjeta chemotherapy on 01/16/2015.  She is scheduled for cycle 2 of the same regimen on 02/06/2015.

## 2015-01-27 NOTE — Telephone Encounter (Signed)
Called and informed patient of negative Strep test.  Per Drue Second, NP.  Patient verbalized understanding.

## 2015-01-27 NOTE — Progress Notes (Signed)
SYMPTOM MANAGEMENT CLINIC   HPI: Rhonda Gardner 59 y.o. female diagnosed with breast cancer.  Currently undergoing neoadjuvant carboplatin/docetaxel/Herceptin/Perjeta chemotherapy regimen.    Patient complaining of runny nose, nasal congestion, sore throat, and sinus pressure.  She denies any productive cough or recent fever/chills.  She is also complaining of some mucositis as well.  Patient states she has magic mouthwash at home; but he does not have any lidocaine in it.  Also, patient developed a skin irritation/rash from previous tape sites; but these do appear to be healing.  HPI  ROS  Past Medical History  Diagnosis Date  . Allergy 2006    chemical cauterization in spring 2006, and 2nd treatment with good results.(Dr.Shoemaker)  . Bipolar disorder   . Breast cancer 12/2014    invasive ductal carcinoma (LEFT)  . Breast cancer of upper-outer quadrant of left female breast 12/22/2014    Past Surgical History  Procedure Laterality Date  . Breast biopsy Bilateral     benign and a right stereotatic breast biopsy.  . Foot surgery Left 2014    Dr. Paulla Dolly  . 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    is allergic to adhesive.    Medication List       This list is accurate as of: 01/27/15  6:59 PM.  Always use your most recent med list.               amoxicillin-clavulanate 875-125 MG per tablet  Commonly known as:  AUGMENTIN  Take 1 tablet by mouth 2 (two) times daily.     dexamethasone 4 MG tablet  Commonly known as:  DECADRON  Take 2 tablets (8 mg total) by mouth 2 (two) times daily. Start the day before Taxotere. Then again the day after chemo for 3 days.     FIRST-DUKES MOUTHWASH Susp  5-10 ml QID prn swish swallow or spit     LAMICTAL 200 MG tablet  Generic drug:  lamoTRIgine  Take 0.5 tablets (100 mg total) by mouth daily.     lidocaine-prilocaine cream  Commonly known as:  EMLA  Apply to affected area once     loratadine 10 MG tablet  Commonly known as:  CLARITIN  Take one tablet on the day of neulasta shot, and repeat the next 2 days     LORazepam 0.5 MG tablet  Commonly known as:  ATIVAN  Take 1 tablet (0.5 mg total) by mouth at bedtime as needed (Nausea or vomiting).     LUTEIN-ZEAXANTHIN PO  Take 1 tablet by mouth daily.     magic mouthwash w/lidocaine Soln  Take 5 mLs by mouth 4 (four) times daily as needed for mouth pain ([facility] mouthwash with nystatin and lidocaine.).     MAGNESIUM CITRATE PO  Take 250 mg by mouth daily.     naproxen sodium 220 MG tablet  Commonly known as:  ANAPROX  Take 220-440 mg by mouth daily as needed (pain). Aleve     omeprazole 40 MG capsule  Commonly known as:  PRILOSEC  Take 1 capsule (40 mg total) by mouth daily.     ondansetron 8 MG tablet  Commonly known as:  ZOFRAN  Take 1 tablet (8 mg total) by mouth  2 (two) times daily. Start the day after chemo for 3 days. Then take as needed for nausea or vomiting.     PRESERVISION AREDS Caps  Take 1 capsule by mouth 2 (two) times daily.     prochlorperazine 10 MG tablet  Commonly known as:  COMPAZINE  Take 1 tablet (10 mg total) by mouth every 6 (six) hours as needed (Nausea or vomiting).     tobramycin-dexamethasone ophthalmic solution  Commonly known as:  TOBRADEX  Place 1 drop into both eyes 2 (two) times daily.     Vitamin B-12 5000 MCG Subl  Place 5,000 mcg under the tongue daily.     Vitamin D 2000 UNITS tablet  Take 2,000 Units by mouth daily.         PHYSICAL EXAMINATION  Oncology Vitals 01/27/2015 01/25/2015 01/16/2015 01/16/2015 01/16/2015 01/16/2015 01/16/2015  Height - 173 cm - - - - -  Weight 63.05 kg 62.959 kg - - - - -    Weight (lbs) 139 lbs 138 lbs 13 oz - - - - -  BMI (kg/m2) - 21.1 kg/m2 - - - - -  Temp 98.4 98.9 98.5 98.5 98.5 98.4 98  Pulse 84 84 61 58 63 62 72  Resp '18 18 18 20 20 18 20  ' SpO2 100 - 100 100 98 100 99  BSA (m2) - 1.74 m2 - - - - -   BP Readings from Last 3 Encounters:  01/27/15 115/73  01/25/15 131/64  01/16/15 116/65    Physical Exam  Constitutional: She is oriented to person, place, and time and well-developed, well-nourished, and in no distress.  HENT:  Head: Normocephalic and atraumatic.  Right Ear: External ear normal.  Left Ear: External ear normal.  Mild nasal congestion on exam; but no facial tenderness with palpation.  Posterior oropharynx with erythema; but no exudate.  Patient has some minor mucositis lesions to the sides of her tongue in her oral mucosa.  Eyes: Conjunctivae and EOM are normal. Pupils are equal, round, and reactive to light. Right eye exhibits no discharge. Left eye exhibits no discharge. No scleral icterus.  Neck: Normal range of motion. Neck supple. No JVD present. No tracheal deviation present. No thyromegaly present.  Cardiovascular: Normal rate, regular rhythm, normal heart sounds and intact distal pulses.   Pulmonary/Chest: Effort normal and breath sounds normal. No respiratory distress. She has no wheezes. She has no rales. She exhibits no tenderness.  Abdominal: Soft. Bowel sounds are normal. She exhibits no distension and no mass. There is no tenderness. There is no rebound and no guarding.  Musculoskeletal: Normal range of motion. She exhibits no edema or tenderness.  Lymphadenopathy:    She has no cervical adenopathy.  Neurological: She is alert and oriented to person, place, and time. Gait normal.  Skin: Skin is warm and dry. No rash noted. No erythema. No pallor.  Psychiatric: Affect normal.  Nursing note and vitals reviewed.   LABORATORY DATA:. Appointment on 01/27/2015  Component Date Value Ref Range Status  . Source  01/27/2015 SWAB   Preliminary  . Streptococcus, Group A Screen (Dir* 01/27/2015 NEG  NEGATIVE Preliminary   Comment:  A Rapid Antigen test may result negative if the antigen level in thesample is below the detection level of this test. The FDA has notcleared this test as a stand-alone test therefore the rapid antigennegative result has reflexed to a Group A Strep  culture, unit [id number].     Appointment on 01/25/2015  Component Date  Value Ref Range Status  . WBC 01/25/2015 39.5* 3.9 - 10.3 10e3/uL Final  . NEUT# 01/25/2015 35.7* 1.5 - 6.5 10e3/uL Final  . HGB 01/25/2015 12.1  11.6 - 15.9 g/dL Final  . HCT 01/25/2015 36.7  34.8 - 46.6 % Final  . Platelets 01/25/2015 144* 145 - 400 10e3/uL Final  . MCV 01/25/2015 92.4  79.5 - 101.0 fL Final  . MCH 01/25/2015 30.5  25.1 - 34.0 pg Final  . MCHC 01/25/2015 33.0  31.5 - 36.0 g/dL Final  . RBC 01/25/2015 3.97  3.70 - 5.45 10e6/uL Final  . RDW 01/25/2015 14.8* 11.2 - 14.5 % Final  . lymph# 01/25/2015 1.4  0.9 - 3.3 10e3/uL Final  . MONO# 01/25/2015 2.2* 0.1 - 0.9 10e3/uL Final  . Eosinophils Absolute 01/25/2015 0.0  0.0 - 0.5 10e3/uL Final  . Basophils Absolute 01/25/2015 0.2* 0.0 - 0.1 10e3/uL Final  . NEUT% 01/25/2015 90.4* 38.4 - 76.8 % Final  . LYMPH% 01/25/2015 3.5* 14.0 - 49.7 % Final  . MONO% 01/25/2015 5.7  0.0 - 14.0 % Final  . EOS% 01/25/2015 0.0  0.0 - 7.0 % Final  . BASO% 01/25/2015 0.4  0.0 - 2.0 % Final  . Sodium 01/25/2015 140  136 - 145 mEq/L Final  . Potassium 01/25/2015 3.8  3.5 - 5.1 mEq/L Final  . Chloride 01/25/2015 105  98 - 109 mEq/L Final  . CO2 01/25/2015 27  22 - 29 mEq/L Final  . Glucose 01/25/2015 107  70 - 140 mg/dl Final  . BUN 01/25/2015 8.7  7.0 - 26.0 mg/dL Final  . Creatinine 01/25/2015 0.8  0.6 - 1.1 mg/dL Final  . Total Bilirubin 01/25/2015 <0.20  0.20 - 1.20 mg/dL Final  . Alkaline Phosphatase 01/25/2015 129  40 - 150 U/L Final  . AST 01/25/2015 23  5 - 34 U/L Final  . ALT 01/25/2015 23  0 - 55 U/L  Final  . Total Protein 01/25/2015 6.4  6.4 - 8.3 g/dL Final  . Albumin 01/25/2015 3.9  3.5 - 5.0 g/dL Final  . Calcium 01/25/2015 9.1  8.4 - 10.4 mg/dL Final  . Anion Gap 01/25/2015 8  3 - 11 mEq/L Final  . EGFR 01/25/2015 81* >90 ml/min/1.73 m2 Final   eGFR is calculated using the CKD-EPI Creatinine Equation (2009)     RADIOGRAPHIC STUDIES: No results found.  ASSESSMENT/PLAN:    Breast cancer of upper-outer quadrant of left female breast Patient received her initial cycle of neoadjuvant carboplatin/docetaxel/Herceptin/Perjeta chemotherapy on 01/16/2015.  She is scheduled for cycle 2 of the same regimen on 02/06/2015.   Mucositis due to chemotherapy Patient is complaining of continued chemotherapy-induced mucositis discomfort.  Patient states that she has Magic mouthwash at home; but doesn't have lidocaine in it.  On exam-patient continues with some mild oral lesions to the site of her tongue and the inside of her oral mucosa.  Prescribed patient Magic mouthwash with lidocaine to try.   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  She has healing rash and skin irritation to both her left elbow region in her right neck area from previous tape sites.  No evidence of active infection noted.  Have placed adhesive tape on allergy list.   URI (upper respiratory infection) Patient complaining of runny nose, nasal congestion, sore throat, and sinus pressure.  She denies any productive cough or recent fever/chills.  On exam-patient does have nasal congestion; but no facial tenderness with palpation   Posterior oropharynx with  erythema; but no exudate.  Rapid strep throat swab was negative today.  Pending throat culture.  After discussion with patient-have advised patient to try over-the-counter antihistamine and decongestants.  She may also use nasal saline for her nasal congestion.  Gave patient a prescription for Augmentin for questionable mild sinusitis symptoms.   Patient states she would prefer to hold on to the Augmentin prescription over the weekend; and plans to only fill the antibiotic prescription if absolutely needed.  Advised patient to call/return or go directly to the emergency department over the weekend she develops any worsening symptoms whatsoever.     Patient stated understanding of all instructions; and was in agreement with this plan of care. The patient knows to call the clinic with any problems, questions or concerns.   Review/collaboration with Dr. Jana Hakim regarding all aspects of patient's visit today.   Total time spent with patient was 25 minutes;  with greater than 75 percent of that time spent in face to face counseling regarding patient's symptoms,  and coordination of care and follow up.  Disclaimer: This note was dictated with voice recognition software. Similar sounding words can inadvertently be transcribed and may not be corrected upon review.   Drue Second, NP 01/27/2015

## 2015-01-27 NOTE — Assessment & Plan Note (Signed)
Patient is complaining of continued chemotherapy-induced mucositis discomfort.  Patient states that she has Magic mouthwash at home; but doesn't have lidocaine in it.  On exam-patient continues with some mild oral lesions to the site of her tongue and the inside of her oral mucosa.  Prescribed patient Magic mouthwash with lidocaine to try.

## 2015-01-27 NOTE — Telephone Encounter (Signed)
TC from patient requesting results of rapid strep test done earlier today. Reviewed chart-test is negative. Relayed this information to patient.  She states she will not be starting antibiotic that was ordered earlier and that Selena Lesser, NP is aware of this.

## 2015-01-27 NOTE — Assessment & Plan Note (Signed)
Patient complaining of runny nose, nasal congestion, sore throat, and sinus pressure.  She denies any productive cough or recent fever/chills.  On exam-patient does have nasal congestion; but no facial tenderness with palpation   Posterior oropharynx with erythema; but no exudate.  Rapid strep throat swab was negative today.  Pending throat culture.  After discussion with patient-have advised patient to try over-the-counter antihistamine and decongestants.  She may also use nasal saline for her nasal congestion.  Gave patient a prescription for Augmentin for questionable mild sinusitis symptoms.  Patient states she would prefer to hold on to the Augmentin prescription over the weekend; and plans to only fill the antibiotic prescription if absolutely needed.  Advised patient to call/return or go directly to the emergency department over the weekend she develops any worsening symptoms whatsoever.

## 2015-01-29 LAB — RAPID STREP SCREEN (MED CTR MEBANE ONLY): Streptococcus, Group A Screen (Direct): NEGATIVE

## 2015-01-29 LAB — THROAT CULTURE

## 2015-01-30 ENCOUNTER — Other Ambulatory Visit: Payer: BLUE CROSS/BLUE SHIELD

## 2015-01-30 ENCOUNTER — Ambulatory Visit: Payer: BLUE CROSS/BLUE SHIELD

## 2015-02-01 ENCOUNTER — Telehealth: Payer: BLUE CROSS/BLUE SHIELD | Admitting: *Deleted

## 2015-02-01 DIAGNOSIS — Z79899 Other long term (current) drug therapy: Secondary | ICD-10-CM

## 2015-02-01 DIAGNOSIS — C50412 Malignant neoplasm of upper-outer quadrant of left female breast: Secondary | ICD-10-CM

## 2015-02-01 NOTE — Telephone Encounter (Signed)
This RN took two prescriptions for wigs to the lobby for patient pickup.

## 2015-02-01 NOTE — Telephone Encounter (Signed)
Patient called reporting loosing her hair.  Going to have hair cut but ask for prescription for cranial prosthesis or wig.  This nurse notified her of the wig boutique near [employer]  She will come over today for a letter for our wig boutique and would like prescription in case she doesn't find anything in our boutique.  Will notify Dr. Jana Hakim.  She will come by after her haircut today to pick these up at front registation desk.

## 2015-02-02 ENCOUNTER — Telehealth: Payer: Self-pay | Admitting: *Deleted

## 2015-02-02 NOTE — Telephone Encounter (Signed)
Spoke to pt in regards to her visit with Cyndee- Pt is feeling much better. Her complaints of mucositis has resolved completely. She only notes she has a runny nose but is not overly concerned by this right now. She reports she is still looking for a wig and has the necessary information to obtain one. Pt understands to call with any concerns or questions.

## 2015-02-06 ENCOUNTER — Ambulatory Visit (HOSPITAL_BASED_OUTPATIENT_CLINIC_OR_DEPARTMENT_OTHER): Payer: BLUE CROSS/BLUE SHIELD | Admitting: Oncology

## 2015-02-06 ENCOUNTER — Ambulatory Visit (HOSPITAL_BASED_OUTPATIENT_CLINIC_OR_DEPARTMENT_OTHER): Payer: BLUE CROSS/BLUE SHIELD

## 2015-02-06 ENCOUNTER — Encounter: Payer: Self-pay | Admitting: *Deleted

## 2015-02-06 ENCOUNTER — Other Ambulatory Visit (HOSPITAL_BASED_OUTPATIENT_CLINIC_OR_DEPARTMENT_OTHER): Payer: BLUE CROSS/BLUE SHIELD

## 2015-02-06 VITALS — BP 121/64 | HR 74 | Temp 98.8°F | Wt 136.4 lb

## 2015-02-06 VITALS — BP 119/60 | HR 85 | Temp 97.8°F | Resp 18

## 2015-02-06 DIAGNOSIS — C50412 Malignant neoplasm of upper-outer quadrant of left female breast: Secondary | ICD-10-CM

## 2015-02-06 DIAGNOSIS — Z5111 Encounter for antineoplastic chemotherapy: Secondary | ICD-10-CM

## 2015-02-06 DIAGNOSIS — K1231 Oral mucositis (ulcerative) due to antineoplastic therapy: Secondary | ICD-10-CM | POA: Diagnosis not present

## 2015-02-06 DIAGNOSIS — R197 Diarrhea, unspecified: Secondary | ICD-10-CM

## 2015-02-06 DIAGNOSIS — Z5189 Encounter for other specified aftercare: Secondary | ICD-10-CM | POA: Diagnosis not present

## 2015-02-06 DIAGNOSIS — Z5112 Encounter for antineoplastic immunotherapy: Secondary | ICD-10-CM

## 2015-02-06 DIAGNOSIS — H538 Other visual disturbances: Secondary | ICD-10-CM

## 2015-02-06 DIAGNOSIS — Z91048 Other nonmedicinal substance allergy status: Secondary | ICD-10-CM

## 2015-02-06 LAB — COMPREHENSIVE METABOLIC PANEL (CC13)
ALT: 36 U/L (ref 0–55)
AST: 23 U/L (ref 5–34)
Albumin: 4 g/dL (ref 3.5–5.0)
Alkaline Phosphatase: 88 U/L (ref 40–150)
Anion Gap: 13 mEq/L — ABNORMAL HIGH (ref 3–11)
BILIRUBIN TOTAL: 0.29 mg/dL (ref 0.20–1.20)
BUN: 18.2 mg/dL (ref 7.0–26.0)
CALCIUM: 9.5 mg/dL (ref 8.4–10.4)
CHLORIDE: 105 meq/L (ref 98–109)
CO2: 23 mEq/L (ref 22–29)
CREATININE: 0.8 mg/dL (ref 0.6–1.1)
EGFR: 82 mL/min/{1.73_m2} — ABNORMAL LOW (ref >90)
Glucose: 104 mg/dl (ref 70–140)
POTASSIUM: 4 meq/L (ref 3.5–5.1)
SODIUM: 141 meq/L (ref 136–145)
Total Protein: 7 g/dL (ref 6.4–8.3)

## 2015-02-06 LAB — CBC WITH DIFFERENTIAL/PLATELET
BASO%: 0.2 % (ref 0.0–2.0)
Basophils Absolute: 0 10*3/uL (ref 0.0–0.1)
EOS ABS: 0 10*3/uL (ref 0.0–0.5)
EOS%: 0 % (ref 0.0–7.0)
HEMATOCRIT: 34.2 % — AB (ref 34.8–46.6)
HGB: 11.3 g/dL — ABNORMAL LOW (ref 11.6–15.9)
LYMPH%: 4.7 % — ABNORMAL LOW (ref 14.0–49.7)
MCH: 30.4 pg (ref 25.1–34.0)
MCHC: 33.1 g/dL (ref 31.5–36.0)
MCV: 91.8 fL (ref 79.5–101.0)
MONO#: 0.3 10*3/uL (ref 0.1–0.9)
MONO%: 3.9 % (ref 0.0–14.0)
NEUT#: 7.4 10*3/uL — ABNORMAL HIGH (ref 1.5–6.5)
NEUT%: 91.2 % — ABNORMAL HIGH (ref 38.4–76.8)
PLATELETS: 350 10*3/uL (ref 145–400)
RBC: 3.73 10*6/uL (ref 3.70–5.45)
RDW: 14.9 % — ABNORMAL HIGH (ref 11.2–14.5)
WBC: 8.1 10*3/uL (ref 3.9–10.3)
lymph#: 0.4 10*3/uL — ABNORMAL LOW (ref 0.9–3.3)

## 2015-02-06 MED ORDER — PERTUZUMAB CHEMO INJECTION 420 MG/14ML
420.0000 mg | Freq: Once | INTRAVENOUS | Status: AC
  Administered 2015-02-06: 420 mg via INTRAVENOUS
  Filled 2015-02-06: qty 14

## 2015-02-06 MED ORDER — ACETAMINOPHEN 325 MG PO TABS
ORAL_TABLET | ORAL | Status: AC
  Filled 2015-02-06: qty 2

## 2015-02-06 MED ORDER — HEPARIN SOD (PORK) LOCK FLUSH 100 UNIT/ML IV SOLN
500.0000 [IU] | Freq: Once | INTRAVENOUS | Status: AC | PRN
  Administered 2015-02-06: 500 [IU]
  Filled 2015-02-06: qty 5

## 2015-02-06 MED ORDER — PEGFILGRASTIM 6 MG/0.6ML ~~LOC~~ PSKT
6.0000 mg | PREFILLED_SYRINGE | Freq: Once | SUBCUTANEOUS | Status: AC
  Administered 2015-02-06: 6 mg via SUBCUTANEOUS
  Filled 2015-02-06: qty 0.6

## 2015-02-06 MED ORDER — SODIUM CHLORIDE 0.9 % IV SOLN
508.0000 mg | Freq: Once | INTRAVENOUS | Status: AC
  Administered 2015-02-06: 510 mg via INTRAVENOUS
  Filled 2015-02-06: qty 51

## 2015-02-06 MED ORDER — DOCETAXEL CHEMO INJECTION 160 MG/16ML
75.0000 mg/m2 | Freq: Once | INTRAVENOUS | Status: AC
  Administered 2015-02-06: 130 mg via INTRAVENOUS
  Filled 2015-02-06: qty 13

## 2015-02-06 MED ORDER — CHOLESTYRAMINE 4 G PO PACK: 4.0000 g | PACK | Freq: Three times a day (TID) | ORAL | Status: DC

## 2015-02-06 MED ORDER — ACETAMINOPHEN 325 MG PO TABS
650.0000 mg | ORAL_TABLET | Freq: Once | ORAL | Status: AC
  Administered 2015-02-06: 650 mg via ORAL

## 2015-02-06 MED ORDER — LOPERAMIDE HCL 2 MG PO CAPS: 2.0000 mg | ORAL_CAPSULE | ORAL | Status: DC | PRN

## 2015-02-06 MED ORDER — SODIUM CHLORIDE 0.9 % IJ SOLN
10.0000 mL | INTRAMUSCULAR | Status: DC | PRN
  Administered 2015-02-06: 10 mL
  Filled 2015-02-06: qty 10

## 2015-02-06 MED ORDER — DIPHENHYDRAMINE HCL 25 MG PO CAPS
50.0000 mg | ORAL_CAPSULE | Freq: Once | ORAL | Status: AC
  Administered 2015-02-06: 50 mg via ORAL

## 2015-02-06 MED ORDER — VALACYCLOVIR HCL 1 G PO TABS: 1000.0000 mg | ORAL_TABLET | Freq: Every day | ORAL | Status: DC

## 2015-02-06 MED ORDER — SODIUM CHLORIDE 0.9 % IV SOLN
Freq: Once | INTRAVENOUS | Status: AC
  Administered 2015-02-06: 09:00:00 via INTRAVENOUS

## 2015-02-06 MED ORDER — FLUCONAZOLE 100 MG PO TABS: 100.0000 mg | ORAL_TABLET | Freq: Every day | ORAL | Status: DC

## 2015-02-06 MED ORDER — TRASTUZUMAB CHEMO INJECTION 440 MG
6.0000 mg/kg | Freq: Once | INTRAVENOUS | Status: AC
  Administered 2015-02-06: 399 mg via INTRAVENOUS
  Filled 2015-02-06: qty 19

## 2015-02-06 MED ORDER — SODIUM CHLORIDE 0.9 % IV SOLN
Freq: Once | INTRAVENOUS | Status: AC
  Administered 2015-02-06: 12:00:00 via INTRAVENOUS
  Filled 2015-02-06: qty 8

## 2015-02-06 NOTE — Patient Instructions (Signed)
[facility] Discharge Instructions for Patients Receiving Chemotherapy  Today you received the following chemotherapy agents Herceptin, Perjeta, Taxotere and Carboplatin  To help prevent nausea and vomiting after your treatment, we encourage you to take your nausea medication as prescribed.   If you develop nausea and vomiting that is not controlled by your nausea medication, call the clinic.   BELOW ARE SYMPTOMS THAT SHOULD BE REPORTED IMMEDIATELY:  *FEVER GREATER THAN 100.5 F  *CHILLS WITH OR WITHOUT FEVER  NAUSEA AND VOMITING THAT IS NOT CONTROLLED WITH YOUR NAUSEA MEDICATION  *UNUSUAL SHORTNESS OF BREATH  *UNUSUAL BRUISING OR BLEEDING  TENDERNESS IN MOUTH AND THROAT WITH OR WITHOUT PRESENCE OF ULCERS  *URINARY PROBLEMS  *BOWEL PROBLEMS  UNUSUAL RASH Items with * indicate a potential emergency and should be followed up as soon as possible.  Feel free to call the clinic you have any questions or concerns. The clinic phone number is [phone].  Please show the [facility] at check-in to the Emergency Department and triage nurse.

## 2015-02-06 NOTE — Progress Notes (Signed)
Met with pt during chemo treatment. Relate she is doing well. Denies needs at this time. Encourage pt to call with questions or concerns. Received verbal under standing.

## 2015-02-06 NOTE — Progress Notes (Signed)
[place]  [phone] [phone]     ID: Athea Haley Cox-Fishman DOB: 29-May-1956  MR#: [id number]  PTW#:[id number]  Patient Care Team: Rita Ohara, MD as PCP - General (Family Medicine) Erroll Luna, MD as Consulting Physician (General Surgery) Chauncey Cruel, MD as Consulting Physician (Oncology) Thea Silversmith, MD as Consulting Physician (Radiation Oncology) Mauro Kaufmann, RN as Registered Nurse Rockwell Germany, RN as Registered Nurse Holley Bouche, NP as Nurse Practitioner (Nurse Practitioner) PCP: Vikki Ports, MD OTHER MD:  CHIEF COMPLAINT: Triple negative breast cancer  CURRENT TREATMENT:  neoadjuvant chemotherapy and anti-HER-2 immunotherapy   BREAST CANCER HISTORY: From the original intake note:  "[name]" had not had a physical exam in quite some time, and had not had a mammogram since 2020, when she was evaluated by Dr. Tomi Bamberger 12/01/2014. Dr. Tomi Bamberger was able to palpate a 2 cm firm mass at the 3:00 position in the left breast. The patient herself has been aware of multiple breast masses and does have a history of fibrocystic change, with multiple prior benign biopsies. She did not feel this particular mass had changed recently.   Dr. Tomi Bamberger arranged for bilateral diagnostic mammography and left breast ultrasonography at the [facility] Center 12/15/2014. This shows the breast density to be category C. In the left breast upper outer quadrant there was a bilobed mass measuring approximately 2 cm. This was palpable. Ultrasound confirmed an irregular microlobulated and hypoechoic mass measuring 2.1 cm at the 2:00 location 6 cm from the nipple. There was no left axillary adenopathy.  Biopsy of the mass in question 12/19/2014 showed ([id number]) invasive ductal carcinoma, grade 2 or 3, estrogen and progesterone receptor negative, with an MIB-1 of 38%, and equivocal HER-2 amplification, the signals ratio being 1.57, the number per cell 4.25.  On  12/27/2014 the patient underwent bilateral breast MRI. The mass in question at the 2:00 location of the left breast measured 2.5 cm. It directly abuts the left pectoralis major. There is no enhancement in the muscle itself. There were no abnormal appearing lymph nodes. There was an incidentally noted 1 cm hepatic cyst at the dome of the right liver lobe.  Her subsequent history is as detailed below  INTERVAL HISTORY: [name] returns today for follow up of her breast cancer, accompanied by her husband [name]. Today is day 1, cycle 2 of 6 planned cycles of of carboplatin, docetaxel, trastuzumab and pertuzumab given every 3 weeks, with neulasta given on day 2 for granulocyte support.   REVIEW OF SYSTEMS: [name] developed significant diarrhea beginning day 3 and continuing for about 10 days. This was initially liquid 3-4 times a day, then a little bit more formed a couple of times a day, and now it has normalized. She was taking calcium with magnesium at this time which may have contributed to this. Of course the pertuzumab is the main culprit. She also had problems with mucositis, involving not only mouth sores, but significant throat pain. We tried Prilosec for this but it did not help. She stopped that after 2 days. She did try Magic mouthwash and that was helpful. Another problem she had was insomnia. She tells me she frequently gets up at night anyway but this time she was up more and found it harder to get back to sleep. She did not find lorazepam or Benadryl helpful. Aside from these issues a detailed review of systems today was noncontributory  PAST MEDICAL HISTORY: Past Medical History  Diagnosis Date  .  Allergy 2006    chemical cauterization in spring 2006, and 2nd treatment with good results.(Dr.Shoemaker)  . Bipolar disorder   . Breast cancer 12/2014    invasive ductal carcinoma (LEFT)  . Breast cancer of upper-outer quadrant of left female breast 12/22/2014    PAST SURGICAL HISTORY: Past  Surgical History  Procedure Laterality Date  . Breast biopsy Bilateral     benign and a right stereotatic breast biopsy.  . Foot surgery Left 2014    Dr. Paulla Dolly  . Fibroid excision  age 20    prolapsed fibroid removed    FAMILY HISTORY Family History  Problem Relation Age of Onset  . Cancer Father     skin - non melanoma - farmer  . Heart disease Father   . Hypertension Mother   . Arthritis Mother     osteoarthritis  . Colon cancer Mother   . Macular degeneration Mother     wet and dry  . Cancer Mother 31    colon cancer at age 29  . Osteoporosis Sister     osteopenia  . Cancer Maternal Aunt     lung cancer  . Diabetes Paternal Aunt   . Cancer Paternal Aunt 51    ? stomach cancer  . Autism Sister   . Mental retardation Sister   . Blindness Sister     secondary to retrolental fibroplasia  . Goiter Paternal Aunt    the patient's father lived to be 95. He had a history of nonmelanoma skin cancers. [name] mother is living at age 65. She was diagnosed with colon cancer in her 55s. A paternal aunt may have had stomach cancer. Maternal aunt developed lung cancer in her 32s. The patient has one brother, 2 sisters. One of her sisters is blind and mentally retarded. There is no history of breast or ovarian cancer in the family.  GYNECOLOGIC HISTORY:  No LMP recorded. Patient is postmenopausal. Menarche age 8. The patient is GX P0. She stopped having periods in her early 51s. She did not take hormone replacement. She took oral contraceptives remotely for over 5 years with no complications.  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 Her husband [name] worked in IT originally but now runs a family firm. It's just the 2 of them at home, plus a chocolate lab.    ADVANCED DIRECTIVES: In place   HEALTH MAINTENANCE: History  Substance Use Topics  . Smoking status: Never Smoker   . Smokeless tobacco: Never Used  . Alcohol Use: No     Colonoscopy: In  [place] under Dr. Lavina Hamman, date not entirely clear  PAP: February 2016  Bone density: Under [facility] Radon, results not currently available  Lipid panel:  Allergies  Allergen Reactions  . Adhesive [Tape] Rash    Pt is sensitive to any kind of adhesive tape products.    Current Outpatient Prescriptions  Medication Sig Dispense Refill  . Alum & Mag Hydroxide-Simeth (MAGIC MOUTHWASH W/LIDOCAINE) SOLN Take 5 mLs by mouth 4 (four) times daily as needed for mouth pain ([facility]'s mouthwash with nystatin and lidocaine.). 240 mL 0  . Cholecalciferol (VITAMIN D) 2000 UNITS tablet Take 2,000 Units by mouth daily.    . cholestyramine (QUESTRAN) 4 G packet Take 1 packet (4 g total) by mouth 3 (three) times daily with meals. 60 each 12  . Cyanocobalamin (VITAMIN B-12) 5000 MCG SUBL Place 5,000 mcg under the tongue daily.    Marland Kitchen dexamethasone (DECADRON) 4 MG tablet Take 2  tablets (8 mg total) by mouth 2 (two) times daily. Start the day before Taxotere. Then again the day after chemo for 3 days. (Patient not taking: Reported on 01/27/2015) 30 tablet 1  . Diphenhyd-Hydrocort-Nystatin (FIRST-DUKES MOUTHWASH) SUSP 5-10 ml QID prn swish swallow or spit 240 mL 3  . fluconazole (DIFLUCAN) 100 MG tablet Take 1 tablet (100 mg total) by mouth daily. 30 tablet 3  . LAMICTAL 200 MG tablet Take 0.5 tablets (100 mg total) by mouth daily. 90 tablet 0  . lidocaine-prilocaine (EMLA) cream Apply to affected area once 30 g 3  . loperamide (IMODIUM) 2 MG capsule Take 1 capsule (2 mg total) by mouth as needed for diarrhea or loose stools. 30 capsule 12  . loratadine (CLARITIN) 10 MG tablet Take one tablet on the day of neulasta shot, and repeat the next 2 days 100 tablet 3  . LORazepam (ATIVAN) 0.5 MG tablet Take 1 tablet (0.5 mg total) by mouth at bedtime as needed (Nausea or vomiting). 30 tablet 0  . LUTEIN-ZEAXANTHIN PO Take 1 tablet by mouth daily.    . Multiple Vitamins-Minerals (PRESERVISION AREDS) CAPS Take 1 capsule by  mouth 2 (two) times daily.    . naproxen sodium (ANAPROX) 220 MG tablet Take 220-440 mg by mouth daily as needed (pain). Aleve    . omeprazole (PRILOSEC) 40 MG capsule Take 1 capsule (40 mg total) by mouth daily. 30 capsule 2  . ondansetron (ZOFRAN) 8 MG tablet Take 1 tablet (8 mg total) by mouth 2 (two) times daily. Start the day after chemo for 3 days. Then take as needed for nausea or vomiting. (Patient not taking: Reported on 01/27/2015) 30 tablet 1  . prochlorperazine (COMPAZINE) 10 MG tablet Take 1 tablet (10 mg total) by mouth every 6 (six) hours as needed (Nausea or vomiting). (Patient not taking: Reported on 01/27/2015) 30 tablet 1  . tobramycin-dexamethasone (TOBRADEX) ophthalmic solution Place 1 drop into both eyes 2 (two) times daily. 5 mL 0  . valACYclovir (VALTREX) 1000 MG tablet Take 1 tablet (1,000 mg total) by mouth daily. 30 tablet 1   No current facility-administered medications for this visit.    OBJECTIVE: Middle-aged white woman in no acute distress  Filed Vitals:   02/06/15 0829  BP: 121/64  Pulse: 74  Temp: 98.8 F (37.1 C)     Body mass index is 20.74 kg/(m^2).    ECOG FS:1 - Symptomatic but completely ambulatory   Sclerae unicteric, pupils round and equal Oropharynx clear and moist-- no thrush or other lesions noted No cervical or supraclavicular adenopathy Lungs no rales or rhonchi Heart regular rate and rhythm Abd soft, nontender, positive bowel sounds MSK no focal spinal tenderness, no upper extremity lymphedema Neuro: nonfocal, well oriented, appropriate affect Breasts: Deferred   LAB RESULTS:  CMP     Component Value Date/Time   NA 141 02/06/2015 0804   NA 142 01/13/2015 0822   K 4.0 02/06/2015 0804   K 3.8 01/13/2015 0822   CL 106 01/13/2015 0822   CO2 23 02/06/2015 0804   CO2 26 01/13/2015 0822   GLUCOSE 104 02/06/2015 0804   GLUCOSE 83 01/13/2015 0822   BUN 18.2 02/06/2015 0804   BUN 8 01/13/2015 0822   CREATININE 0.8 02/06/2015 0804    CREATININE 0.77 01/13/2015 0822   CREATININE 0.80 12/01/2014 1437   CALCIUM 9.5 02/06/2015 0804   CALCIUM 9.1 01/13/2015 0822   PROT 7.0 02/06/2015 0804   PROT 7.0 12/01/2014 1437   ALBUMIN  4.0 02/06/2015 0804   ALBUMIN 4.8 12/01/2014 1437   AST 23 02/06/2015 0804   AST 28 12/01/2014 1437   ALT 36 02/06/2015 0804   ALT 24 12/01/2014 1437   ALKPHOS 88 02/06/2015 0804   ALKPHOS 65 12/01/2014 1437   BILITOT 0.29 02/06/2015 0804   BILITOT 0.6 12/01/2014 1437   GFRNONAA 89* 01/13/2015 0822   GFRAA >90 01/13/2015 0822    INo results found for: SPEP, UPEP  Lab Results  Component Value Date   WBC 8.1 02/06/2015   NEUTROABS 7.4* 02/06/2015   HGB 11.3* 02/06/2015   HCT 34.2* 02/06/2015   MCV 91.8 02/06/2015   PLT 350 02/06/2015      Chemistry      Component Value Date/Time   NA 141 02/06/2015 0804   NA 142 01/13/2015 0822   K 4.0 02/06/2015 0804   K 3.8 01/13/2015 0822   CL 106 01/13/2015 0822   CO2 23 02/06/2015 0804   CO2 26 01/13/2015 0822   BUN 18.2 02/06/2015 0804   BUN 8 01/13/2015 0822   CREATININE 0.8 02/06/2015 0804   CREATININE 0.77 01/13/2015 0822   CREATININE 0.80 12/01/2014 1437      Component Value Date/Time   CALCIUM 9.5 02/06/2015 0804   CALCIUM 9.1 01/13/2015 0822   ALKPHOS 88 02/06/2015 0804   ALKPHOS 65 12/01/2014 1437   AST 23 02/06/2015 0804   AST 28 12/01/2014 1437   ALT 36 02/06/2015 0804   ALT 24 12/01/2014 1437   BILITOT 0.29 02/06/2015 0804   BILITOT 0.6 12/01/2014 1437       No results found for: LABCA2  No components found for: [id number]  No results for input(s): INR in the last 168 hours.  Urinalysis    Component Value Date/Time   LABSPEC 1.010 08/09/2007 2121   PHURINE 7.5 08/09/2007 2121   GLUCOSEU NEGATIVE 08/09/2007 2121   HGBUR SMALL* 08/09/2007 2121   BILIRUBINUR neg 12/01/2014 1358   BILIRUBINUR NEGATIVE 08/09/2007 2121   KETONESUR NEGATIVE 08/09/2007 2121   PROTEINUR neg 12/01/2014 1358   PROTEINUR NEGATIVE  08/09/2007 2121   UROBILINOGEN negative 12/01/2014 1358   UROBILINOGEN 0.2 08/09/2007 2121   NITRITE neg 12/01/2014 1358   NITRITE NEGATIVE 08/09/2007 2121   LEUKOCYTESUR Negative 12/01/2014 1358    STUDIES: Ir Fluoro Guide Cv Line Right  01/13/2015   CLINICAL DATA:  Left sided breast cancer  EXAM: TUNNEL POWER PORT PLACEMENT WITH SUBCUTANEOUS POCKET UTILIZING ULTRASOUND & FLOUROSCOPY  FLUOROSCOPY TIME:  24 seconds.  MEDICATIONS AND MEDICAL HISTORY: Versed 1.5 mg, Fentanyl 75 mcg.  As antibiotic prophylaxis, Ancef was ordered pre-procedure and administered intravenously within one hour of incision.  ANESTHESIA/SEDATION: Moderate sedation time: 30 minutes  CONTRAST:  None  PROCEDURE: After written informed consent was obtained, patient was placed in the supine position on angiographic table. The right neck and chest was prepped and draped in a sterile fashion. Lidocaine was utilized for local anesthesia. The right internal jugular vein was noted to be patent initially with ultrasound. Under sonographic guidance, a micropuncture needle was inserted into the right IJ vein (Ultrasound and fluoroscopic image documentation was performed). The needle was removed over an 018 wire which was exchanged for a Amplatz. This was advanced into the IVC. An 8-French dilator was advanced over the Amplatz.  A small incision was made in the right upper chest over the anterior right second rib. Utilizing blunt dissection, a subcutaneous pocket was created in the caudal direction. The pocket was irrigated with  a copious amount of sterile normal saline. The port catheter was tunneled from the chest incision, and out the neck incision. The reservoir was inserted into the subcutaneous pocket and secured with two 3-0 Ethilon stitches. A peel-away sheath was advanced over the Amplatz wire. The port catheter was cut to measure length and inserted through the peel-away sheath. The peel-away sheath was removed. The chest incision was  closed with 3-0 Vicryl interrupted stitches for the subcutaneous tissue and a running of 4-0 Vicryl subcuticular stitch for the skin. The neck incision was closed with a 4-0 Vicryl subcuticular stitch. Derma-bond was applied to both surgical incisions. The port reservoir was flushed and instilled with heparinized saline. No complications.  FINDINGS: A right IJ vein Port-A-Cath is in place with its tip at the cavoatrial junction.  COMPLICATIONS: None  IMPRESSION: Successful 8 French right internal jugular vein power port placement with its tip at the SVC/RA junction.   Electronically Signed   By: Marybelle Killings M.D.   On: 01/13/2015 10:22   Ir US Guide Vasc Access Right  01/13/2015   CLINICAL DATA:  Left sided breast cancer  EXAM: TUNNEL POWER PORT PLACEMENT WITH SUBCUTANEOUS POCKET UTILIZING ULTRASOUND & FLOUROSCOPY  FLUOROSCOPY TIME:  24 seconds.  MEDICATIONS AND MEDICAL HISTORY: Versed 1.5 mg, Fentanyl 75 mcg.  As antibiotic prophylaxis, Ancef was ordered pre-procedure and administered intravenously within one hour of incision.  ANESTHESIA/SEDATION: Moderate sedation time: 30 minutes  CONTRAST:  None  PROCEDURE: After written informed consent was obtained, patient was placed in the supine position on angiographic table. The right neck and chest was prepped and draped in a sterile fashion. Lidocaine was utilized for local anesthesia. The right internal jugular vein was noted to be patent initially with ultrasound. Under sonographic guidance, a micropuncture needle was inserted into the right IJ vein (Ultrasound and fluoroscopic image documentation was performed). The needle was removed over an 018 wire which was exchanged for a Amplatz. This was advanced into the IVC. An 8-French dilator was advanced over the Amplatz.  A small incision was made in the right upper chest over the anterior right second rib. Utilizing blunt dissection, a subcutaneous pocket was created in the caudal direction. The pocket was irrigated  with a copious amount of sterile normal saline. The port catheter was tunneled from the chest incision, and out the neck incision. The reservoir was inserted into the subcutaneous pocket and secured with two 3-0 Ethilon stitches. A peel-away sheath was advanced over the Amplatz wire. The port catheter was cut to measure length and inserted through the peel-away sheath. The peel-away sheath was removed. The chest incision was closed with 3-0 Vicryl interrupted stitches for the subcutaneous tissue and a running of 4-0 Vicryl subcuticular stitch for the skin. The neck incision was closed with a 4-0 Vicryl subcuticular stitch. Derma-bond was applied to both surgical incisions. The port reservoir was flushed and instilled with heparinized saline. No complications.  FINDINGS: A right IJ vein Port-A-Cath is in place with its tip at the cavoatrial junction.  COMPLICATIONS: None  IMPRESSION: Successful 8 French right internal jugular vein power port placement with its tip at the SVC/RA junction.   Electronically Signed   By: Marybelle Killings M.D.   On: 01/13/2015 10:22    ASSESSMENT: 59 y.o.  woman s/p left breast upper outer quadrant biopsy 12/19/2014 for a clinical T2 N0, stage IIA invasive ductal carcinoma, grade 2 or 3, estrogen and progesterone receptor negative, with an MIB-1 of 38%, HER-2 equivocal  initially but positive on further testing with a ratio of 3.4  (1) neoadjuvant chemotherapy to consist of carboplatin, docetaxel, trastuzumab and pertuzumab given every 3 weeks for 6 cycles with Neulasta support--started 01/16/2015  (2) definitive surgery to follow chemotherapy  (3) radiation to follow surgery  (4) genetics counselor felt the risk of a familial mutation was sufficiently low testing could be omitted  PLAN: [name] did generally well with her first cycle of chemotherapy/immunotherapy, but she did have some of the expected complications, specifically mucositis, sore throat, and diarrhea.  I  am particularly concerned about the diarrhea, which can lead to discontinuation of the pertuzumab. I suggested she start Imodium with the first of diarrhea bowel movement and also that she take Questran twice daily beginning on day 2 and continuing through day 11.  As far as the mucositis is concerned she will start Valtrex 1000 mg daily and continue that through the end of chemotherapy. I also wrote her a prescription for Diflucan to be started on day 2 and continued through day 11 of each cycle.  She hates taking medicines so this will be "touch and go", but I hope she will "experiment" with these medications, and that they make her second cycle better than the first.   We are proceeding with treatment today. She will see [place] again next week to make sure these changes have helped.  Chauncey Cruel, MD   02/06/2015 9:01 AM

## 2015-02-09 ENCOUNTER — Other Ambulatory Visit: Payer: BLUE CROSS/BLUE SHIELD

## 2015-02-09 ENCOUNTER — Ambulatory Visit: Payer: BLUE CROSS/BLUE SHIELD | Admitting: Oncology

## 2015-02-09 ENCOUNTER — Encounter: Payer: Self-pay | Admitting: *Deleted

## 2015-02-09 NOTE — Progress Notes (Unsigned)
Received a call from pt requesting lab, f/u with Gudena and chemo to be moved to 5/17 d/t she has an important meeting to attend on 5/16. POF entered to request appts to be moved.

## 2015-02-10 ENCOUNTER — Telehealth: Payer: Self-pay

## 2015-02-10 ENCOUNTER — Other Ambulatory Visit: Payer: Self-pay | Admitting: *Deleted

## 2015-02-10 MED ORDER — SUCRALFATE 1 GM/10ML PO SUSP: 1.0000 g | Freq: Four times a day (QID) | ORAL | Status: DC | PRN

## 2015-02-10 NOTE — Telephone Encounter (Signed)
Pt called wanting to talk with someone about her continuing problem with indigestion.

## 2015-02-10 NOTE — Telephone Encounter (Signed)
Throat is sore, food feels almost like it sticks.She gets a burning sensation up and down her esophagus. She is using omeprazole but not doing the trick. Dukes mouthwash is helping some but not with the throat. No nausea, no vomiting, no diarrhea. No fever, no sore beefy tongue or white spots. She is using [facility]'s about 2x/day. Encouraged her to increase to 3-4x/day as prescribed. Also to gargle with it. She can use baking soda mouth rinses as well. She is concerned with the throat and esophagus more than her mouth.

## 2015-02-14 ENCOUNTER — Ambulatory Visit (HOSPITAL_BASED_OUTPATIENT_CLINIC_OR_DEPARTMENT_OTHER): Payer: BLUE CROSS/BLUE SHIELD | Admitting: Nurse Practitioner

## 2015-02-14 ENCOUNTER — Encounter: Payer: Self-pay | Admitting: Nurse Practitioner

## 2015-02-14 ENCOUNTER — Telehealth: Payer: Self-pay | Admitting: Nurse Practitioner

## 2015-02-14 ENCOUNTER — Other Ambulatory Visit (HOSPITAL_BASED_OUTPATIENT_CLINIC_OR_DEPARTMENT_OTHER): Payer: BLUE CROSS/BLUE SHIELD

## 2015-02-14 VITALS — BP 120/57 | HR 88 | Temp 98.2°F | Resp 18 | Ht 68.0 in | Wt 134.6 lb

## 2015-02-14 DIAGNOSIS — C50412 Malignant neoplasm of upper-outer quadrant of left female breast: Secondary | ICD-10-CM

## 2015-02-14 DIAGNOSIS — Z171 Estrogen receptor negative status [ER-]: Secondary | ICD-10-CM | POA: Diagnosis not present

## 2015-02-14 LAB — CBC WITH DIFFERENTIAL/PLATELET
BASO%: 0.2 % (ref 0.0–2.0)
Basophils Absolute: 0.1 10*3/uL (ref 0.0–0.1)
EOS%: 0 % (ref 0.0–7.0)
Eosinophils Absolute: 0 10*3/uL (ref 0.0–0.5)
HEMATOCRIT: 34.3 % — AB (ref 34.8–46.6)
HGB: 11.2 g/dL — ABNORMAL LOW (ref 11.6–15.9)
LYMPH%: 7.7 % — AB (ref 14.0–49.7)
MCH: 30.4 pg (ref 25.1–34.0)
MCHC: 32.7 g/dL (ref 31.5–36.0)
MCV: 93.2 fL (ref 79.5–101.0)
MONO#: 1.5 10*3/uL — ABNORMAL HIGH (ref 0.1–0.9)
MONO%: 6.5 % (ref 0.0–14.0)
NEUT#: 19.5 10*3/uL — ABNORMAL HIGH (ref 1.5–6.5)
NEUT%: 85.6 % — AB (ref 38.4–76.8)
Platelets: 174 10*3/uL (ref 145–400)
RBC: 3.68 10*6/uL — ABNORMAL LOW (ref 3.70–5.45)
RDW: 14.6 % — ABNORMAL HIGH (ref 11.2–14.5)
WBC: 22.8 10*3/uL — AB (ref 3.9–10.3)
lymph#: 1.8 10*3/uL (ref 0.9–3.3)

## 2015-02-14 LAB — COMPREHENSIVE METABOLIC PANEL (CC13)
ALT: 49 U/L (ref 0–55)
ANION GAP: 10 meq/L (ref 3–11)
AST: 26 U/L (ref 5–34)
Albumin: 3.7 g/dL (ref 3.5–5.0)
Alkaline Phosphatase: 123 U/L (ref 40–150)
BUN: 12.4 mg/dL (ref 7.0–26.0)
CO2: 24 mEq/L (ref 22–29)
Calcium: 8.7 mg/dL (ref 8.4–10.4)
Chloride: 104 mEq/L (ref 98–109)
Creatinine: 0.8 mg/dL (ref 0.6–1.1)
EGFR: 80 mL/min/{1.73_m2} — ABNORMAL LOW (ref >90)
Glucose: 104 mg/dl (ref 70–140)
POTASSIUM: 3.8 meq/L (ref 3.5–5.1)
Sodium: 138 mEq/L (ref 136–145)
TOTAL PROTEIN: 6.4 g/dL (ref 6.4–8.3)
Total Bilirubin: <0.2 mg/dL (ref 0.20–1.20)

## 2015-02-14 NOTE — Telephone Encounter (Signed)
Gave avs & calendar for May. Sent message to adjust treatment. Sent message to override appointment.

## 2015-02-14 NOTE — Progress Notes (Signed)
[place]  [phone] [phone]     ID: Rhonda Gardner DOB: 11/13/1955  MR#: [id number]  ERD#:[id number]  Patient Care Team: Rhonda Ohara, MD as PCP - General (Family Medicine) Rhonda Luna, MD as Consulting Physician (General Surgery) Rhonda Cruel, MD as Consulting Physician (Oncology) Rhonda Silversmith, MD as Consulting Physician (Radiation Oncology) Rhonda Kaufmann, RN as Registered Nurse Rhonda Germany, RN as Registered Nurse Rhonda Bouche, NP as Nurse Practitioner (Nurse Practitioner) PCP: Rhonda Ports, MD OTHER MD:  CHIEF COMPLAINT: Triple negative breast cancer  CURRENT TREATMENT:  neoadjuvant chemotherapy and anti-HER-2 immunotherapy   BREAST CANCER HISTORY: From the original intake note:  "Rhonda Gardner" had not had a physical exam in quite some time, and had not had a mammogram since 2020, when she was evaluated by Dr. Tomi Gardner 12/01/2014. Dr. Tomi Gardner was able to palpate a 2 cm firm mass at the 3:00 position in the left breast. The patient herself has been aware of multiple breast masses and does have a history of fibrocystic change, with multiple prior benign biopsies. She did not feel this particular mass had changed recently.   Dr. Tomi Gardner arranged for bilateral diagnostic mammography and left breast ultrasonography at the [facility] Center 12/15/2014. This shows the breast density to be category C. In the left breast upper outer quadrant there was a bilobed mass measuring approximately 2 cm. This was palpable. Ultrasound confirmed an irregular microlobulated and hypoechoic mass measuring 2.1 cm at the 2:00 location 6 cm from the nipple. There was no left axillary adenopathy.  Biopsy of the mass in question 12/19/2014 showed ([id number]) invasive ductal carcinoma, grade 2 or 3, estrogen and progesterone receptor negative, with an MIB-1 of 38%, and equivocal HER-2 amplification, the signals ratio being 1.57, the number per cell 4.25.  On  12/27/2014 the patient underwent bilateral breast MRI. The mass in question at the 2:00 location of the left breast measured 2.5 cm. It directly abuts the left pectoralis major. There is no enhancement in the muscle itself. There were no abnormal appearing lymph nodes. There was an incidentally noted 1 cm hepatic cyst at the dome of the right liver lobe.  Her subsequent history is as detailed below  INTERVAL HISTORY: Rhonda Gardner returns today for follow up of her breast cancer, accompanied by her husband Rhonda Gardner. Today is day 9, cycle 2 of 6 planned cycles of of carboplatin, docetaxel, trastuzumab and pertuzumab given every 3 weeks, with neulasta given on day 2 for granulocyte support.   REVIEW OF SYSTEMS: Rhonda Gardner denies fevers or chills. She had minimal nausea this cycle. She had loose stools but imodium covered her and the questran powder was not necessary. Her mouth sores were resolved with the magic mouth wash, but her gums are still sensitive. She had no achiness with the neulasta injections. She had some blurriness after reading for a few house. She has a runny nose with clear drainage that is blood tinged at times. A detailed review of systems is otherwise stable.    PAST MEDICAL HISTORY: Past Medical History  Diagnosis Date  . Allergy 2006    chemical cauterization in spring 2006, and 2nd treatment with good results.(RhondaShoemaker)  . Bipolar disorder   . Breast cancer 12/2014    invasive ductal carcinoma (LEFT)  . Breast cancer of upper-outer quadrant of left female breast 12/22/2014    PAST SURGICAL HISTORY: Past Surgical History  Procedure Laterality Date  . Breast biopsy Bilateral     benign and  a right stereotatic breast biopsy.  . Foot surgery Left 2014    Rhonda Gardner  . Fibroid excision  age 71    prolapsed fibroid removed    FAMILY HISTORY Family History  Problem Relation Age of Onset  . Cancer Father     skin - non melanoma - farmer  . Heart disease Father   . Hypertension  Mother   . Arthritis Mother     osteoarthritis  . Colon cancer Mother   . Macular degeneration Mother     wet and dry  . Cancer Mother 47    colon cancer at age 68  . Osteoporosis Sister     osteopenia  . Cancer Maternal Aunt     lung cancer  . Diabetes Paternal Aunt   . Cancer Paternal Aunt 92    ? stomach cancer  . Autism Sister   . Mental retardation Sister   . Blindness Sister     secondary to retrolental fibroplasia  . Goiter Paternal Aunt    the patient's father lived to be 95. He had a history of nonmelanoma skin cancers. [name] mother is living at age 91. She was diagnosed with colon cancer in her 46s. A paternal aunt may have had stomach cancer. Maternal aunt developed lung cancer in her 82s. The patient has one brother, 2 sisters. One of her sisters is blind and mentally retarded. There is no history of breast or ovarian cancer in the family.  GYNECOLOGIC HISTORY:  No LMP recorded. Patient is postmenopausal. Menarche age 32. The patient is GX P0. She stopped having periods in her early 88s. She did not take hormone replacement. She took oral contraceptives remotely for over 5 years with no complications.  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 Her husband Rhonda Gardner worked in IT originally but now runs a family firm. It's just the 2 of them at home, plus a chocolate lab.    ADVANCED DIRECTIVES: In place   HEALTH MAINTENANCE: History  Substance Use Topics  . Smoking status: Never Smoker   . Smokeless tobacco: Never Used  . Alcohol Use: No     Colonoscopy: In [place] under Rhonda Gardner, date not entirely clear  PAP: February 2016  Bone density: Under Rhonda Gardner, results not currently available  Lipid panel:  Allergies  Allergen Reactions  . Adhesive [Tape] Rash    Pt is sensitive to any kind of adhesive tape products.    Current Outpatient Prescriptions  Medication Sig Dispense Refill  . Cholecalciferol (VITAMIN D) 2000 UNITS tablet  Take 2,000 Units by mouth daily.    . Cyanocobalamin (VITAMIN B-12) 5000 MCG SUBL Place 5,000 mcg under the tongue daily.    . Diphenhyd-Hydrocort-Nystatin (FIRST-DUKES MOUTHWASH) SUSP 5-10 ml QID prn swish swallow or spit 240 mL 3  . fluconazole (DIFLUCAN) 100 MG tablet Take 1 tablet (100 mg total) by mouth daily. 30 tablet 3  . LAMICTAL 200 MG tablet Take 0.5 tablets (100 mg total) by mouth daily. 90 tablet 0  . lidocaine-prilocaine (EMLA) cream Apply to affected area once 30 g 3  . loperamide (IMODIUM) 2 MG capsule Take 1 capsule (2 mg total) by mouth as needed for diarrhea or loose stools. 30 capsule 12  . loratadine (CLARITIN) 10 MG tablet Take one tablet on the day of neulasta shot, and repeat the next 2 days 100 tablet 3  . LORazepam (ATIVAN) 0.5 MG tablet Take 1 tablet (0.5 mg total) by mouth at bedtime as  needed (Nausea or vomiting). 30 tablet 0  . LUTEIN-ZEAXANTHIN PO Take 1 tablet by mouth daily.    . Multiple Vitamins-Minerals (PRESERVISION AREDS) CAPS Take 1 capsule by mouth 2 (two) times daily.    Marland Kitchen omeprazole (PRILOSEC) 40 MG capsule Take 1 capsule (40 mg total) by mouth daily. 30 capsule 2  . sucralfate (CARAFATE) 1 GM/10ML suspension Take 10 mLs (1 g total) by mouth 4 (four) times daily as needed. 420 mL 0  . tobramycin-dexamethasone (TOBRADEX) ophthalmic solution Place 1 drop into both eyes 2 (two) times daily. 5 mL 0  . valACYclovir (VALTREX) 1000 MG tablet Take 1 tablet (1,000 mg total) by mouth daily. 30 tablet 1  . Alum & Mag Hydroxide-Simeth (MAGIC MOUTHWASH W/LIDOCAINE) SOLN Take 5 mLs by mouth 4 (four) times daily as needed for mouth pain ([facility] mouthwash with nystatin and lidocaine.). (Patient not taking: Reported on 02/14/2015) 240 mL 0  . cholestyramine (QUESTRAN) 4 G packet Take 1 packet (4 g total) by mouth 3 (three) times daily with meals. (Patient not taking: Reported on 02/14/2015) 60 each 12  . dexamethasone (DECADRON) 4 MG tablet Take 2 tablets (8 mg total) by mouth 2  (two) times daily. Start the day before Taxotere. Then again the day after chemo for 3 days. (Patient not taking: Reported on 01/27/2015) 30 tablet 1  . naproxen sodium (ANAPROX) 220 MG tablet Take 220-440 mg by mouth daily as needed (pain). Aleve    . ondansetron (ZOFRAN) 8 MG tablet Take 1 tablet (8 mg total) by mouth 2 (two) times daily. Start the day after chemo for 3 days. Then take as needed for nausea or vomiting. (Patient not taking: Reported on 01/27/2015) 30 tablet 1  . prochlorperazine (COMPAZINE) 10 MG tablet Take 1 tablet (10 mg total) by mouth every 6 (six) hours as needed (Nausea or vomiting). (Patient not taking: Reported on 01/27/2015) 30 tablet 1   No current facility-administered medications for this visit.    OBJECTIVE: Middle-aged white woman in no acute distress  Filed Vitals:   02/14/15 1356  BP: 120/57  Pulse: 88  Temp: 98.2 F (36.8 C)  Resp: 18     Body mass index is 20.47 kg/(m^2).    ECOG FS:1 - Symptomatic but completely ambulatory   skin: warm, dry  HEENT: sclerae anicteric, conjunctivae pink, oropharynx clear. No thrush or mucositis.  Lymph Nodes: No cervical or supraclavicular lymphadenopathy  Lungs: clear to auscultation bilaterally, no rales, wheezes, or rhonci  Heart: regular rate and rhythm  Abdomen: round, soft, non tender, positive bowel sounds  Musculoskeletal: No focal spinal tenderness, no peripheral edema  Neuro: non focal, well oriented, positive affect  Breasts: deferred   LAB RESULTS:  CMP     Component Value Date/Time   NA 138 02/14/2015 1325   NA 142 01/13/2015 0822   K 3.8 02/14/2015 1325   K 3.8 01/13/2015 0822   CL 106 01/13/2015 0822   CO2 24 02/14/2015 1325   CO2 26 01/13/2015 0822   GLUCOSE 104 02/14/2015 1325   GLUCOSE 83 01/13/2015 0822   BUN 12.4 02/14/2015 1325   BUN 8 01/13/2015 0822   CREATININE 0.8 02/14/2015 1325   CREATININE 0.77 01/13/2015 0822   CREATININE 0.80 12/01/2014 1437   CALCIUM 8.7 02/14/2015 1325    CALCIUM 9.1 01/13/2015 0822   PROT 6.4 02/14/2015 1325   PROT 7.0 12/01/2014 1437   ALBUMIN 3.7 02/14/2015 1325   ALBUMIN 4.8 12/01/2014 1437   AST 26 02/14/2015 1325  AST 28 12/01/2014 1437   ALT 49 02/14/2015 1325   ALT 24 12/01/2014 1437   ALKPHOS 123 02/14/2015 1325   ALKPHOS 65 12/01/2014 1437   BILITOT <0.20 02/14/2015 1325   BILITOT 0.6 12/01/2014 1437   GFRNONAA 89* 01/13/2015 0822   GFRAA >90 01/13/2015 0822    INo results found for: SPEP, UPEP  Lab Results  Component Value Date   WBC 22.8* 02/14/2015   NEUTROABS 19.5* 02/14/2015   HGB 11.2* 02/14/2015   HCT 34.3* 02/14/2015   MCV 93.2 02/14/2015   PLT 174 02/14/2015      Chemistry      Component Value Date/Time   NA 138 02/14/2015 1325   NA 142 01/13/2015 0822   K 3.8 02/14/2015 1325   K 3.8 01/13/2015 0822   CL 106 01/13/2015 0822   CO2 24 02/14/2015 1325   CO2 26 01/13/2015 0822   BUN 12.4 02/14/2015 1325   BUN 8 01/13/2015 0822   CREATININE 0.8 02/14/2015 1325   CREATININE 0.77 01/13/2015 0822   CREATININE 0.80 12/01/2014 1437      Component Value Date/Time   CALCIUM 8.7 02/14/2015 1325   CALCIUM 9.1 01/13/2015 0822   ALKPHOS 123 02/14/2015 1325   ALKPHOS 65 12/01/2014 1437   AST 26 02/14/2015 1325   AST 28 12/01/2014 1437   ALT 49 02/14/2015 1325   ALT 24 12/01/2014 1437   BILITOT <0.20 02/14/2015 1325   BILITOT 0.6 12/01/2014 1437       No results found for: LABCA2  No components found for: LABCA125  No results for input(s): INR in the last 168 hours.  Urinalysis    Component Value Date/Time   LABSPEC 1.010 08/09/2007 2121   PHURINE 7.5 08/09/2007 2121   GLUCOSEU NEGATIVE 08/09/2007 2121   HGBUR SMALL* 08/09/2007 2121   BILIRUBINUR neg 12/01/2014 [place] 08/09/2007 2121   KETONESUR NEGATIVE 08/09/2007 2121   PROTEINUR neg 12/01/2014 [place] 08/09/2007 2121   UROBILINOGEN negative 12/01/2014 1358   UROBILINOGEN 0.2 08/09/2007 2121    NITRITE neg 12/01/2014 1358   NITRITE NEGATIVE 08/09/2007 2121   LEUKOCYTESUR Negative 12/01/2014 1358    STUDIES: No results found.  ASSESSMENT: 59 y.o. [place] woman s/p left breast upper outer quadrant biopsy 12/19/2014 for a clinical T2 N0, stage IIA invasive ductal carcinoma, grade 2 or 3, estrogen and progesterone receptor negative, with an MIB-1 of 38%, HER-2 equivocal initially but positive on further testing with a ratio of 3.4  (1) neoadjuvant chemotherapy to consist of carboplatin, docetaxel, trastuzumab and pertuzumab given every 3 weeks for 6 cycles with Neulasta support--started 01/16/2015  (2) definitive surgery to follow chemotherapy  (3) radiation to follow surgery  (4) genetics counselor felt the risk of a familial mutation was sufficiently low testing could be omitted  PLAN: Rhonda Gardner managed her 2nd of cycle of treatment better than the first. She actually feels well today. The labs were reviewed in detail and were entirely stable. Accordingly, I am not making any adjustments to her regimen.   Rhonda Gardner will return in 2 weeks for the start of cycle 3 of treatment. She understands and agrees with this plan. She knows the goal of treatment in her case is cure. She has been encouraged to call with any issues that might arise before her next visit here.  Laurie Panda, NP   02/14/2015 4:58 PM

## 2015-02-27 ENCOUNTER — Ambulatory Visit: Payer: BLUE CROSS/BLUE SHIELD

## 2015-02-27 ENCOUNTER — Ambulatory Visit: Payer: BLUE CROSS/BLUE SHIELD | Admitting: Nurse Practitioner

## 2015-02-27 ENCOUNTER — Other Ambulatory Visit: Payer: BLUE CROSS/BLUE SHIELD

## 2015-02-28 ENCOUNTER — Other Ambulatory Visit: Payer: BLUE CROSS/BLUE SHIELD

## 2015-02-28 ENCOUNTER — Other Ambulatory Visit (HOSPITAL_BASED_OUTPATIENT_CLINIC_OR_DEPARTMENT_OTHER): Payer: BLUE CROSS/BLUE SHIELD

## 2015-02-28 ENCOUNTER — Ambulatory Visit (HOSPITAL_BASED_OUTPATIENT_CLINIC_OR_DEPARTMENT_OTHER): Payer: BLUE CROSS/BLUE SHIELD | Admitting: Nurse Practitioner

## 2015-02-28 ENCOUNTER — Ambulatory Visit: Payer: BLUE CROSS/BLUE SHIELD | Admitting: Hematology and Oncology

## 2015-02-28 ENCOUNTER — Ambulatory Visit (HOSPITAL_BASED_OUTPATIENT_CLINIC_OR_DEPARTMENT_OTHER): Payer: BLUE CROSS/BLUE SHIELD

## 2015-02-28 ENCOUNTER — Encounter: Payer: Self-pay | Admitting: Nurse Practitioner

## 2015-02-28 VITALS — BP 117/67 | HR 80 | Temp 97.9°F | Resp 18 | Ht 68.0 in | Wt 137.6 lb

## 2015-02-28 DIAGNOSIS — Z5189 Encounter for other specified aftercare: Secondary | ICD-10-CM

## 2015-02-28 DIAGNOSIS — Z171 Estrogen receptor negative status [ER-]: Secondary | ICD-10-CM

## 2015-02-28 DIAGNOSIS — C50412 Malignant neoplasm of upper-outer quadrant of left female breast: Secondary | ICD-10-CM | POA: Diagnosis not present

## 2015-02-28 DIAGNOSIS — Z5112 Encounter for antineoplastic immunotherapy: Secondary | ICD-10-CM

## 2015-02-28 DIAGNOSIS — Z5111 Encounter for antineoplastic chemotherapy: Secondary | ICD-10-CM | POA: Diagnosis not present

## 2015-02-28 LAB — CBC WITH DIFFERENTIAL/PLATELET
BASO%: 0.2 % (ref 0.0–2.0)
Basophils Absolute: 0 10*3/uL (ref 0.0–0.1)
EOS ABS: 0 10*3/uL (ref 0.0–0.5)
EOS%: 0 % (ref 0.0–7.0)
HCT: 31.3 % — ABNORMAL LOW (ref 34.8–46.6)
HGB: 10.3 g/dL — ABNORMAL LOW (ref 11.6–15.9)
LYMPH%: 4.6 % — ABNORMAL LOW (ref 14.0–49.7)
MCH: 31.1 pg (ref 25.1–34.0)
MCHC: 33 g/dL (ref 31.5–36.0)
MCV: 94.3 fL (ref 79.5–101.0)
MONO#: 0.3 10*3/uL (ref 0.1–0.9)
MONO%: 4.3 % (ref 0.0–14.0)
NEUT#: 6.7 10*3/uL — ABNORMAL HIGH (ref 1.5–6.5)
NEUT%: 90.9 % — ABNORMAL HIGH (ref 38.4–76.8)
Platelets: 216 10*3/uL (ref 145–400)
RBC: 3.32 10*6/uL — ABNORMAL LOW (ref 3.70–5.45)
RDW: 15.7 % — ABNORMAL HIGH (ref 11.2–14.5)
WBC: 7.3 10*3/uL (ref 3.9–10.3)
lymph#: 0.3 10*3/uL — ABNORMAL LOW (ref 0.9–3.3)

## 2015-02-28 LAB — COMPREHENSIVE METABOLIC PANEL (CC13)
ALT: 97 U/L — AB (ref 0–55)
AST: 52 U/L — ABNORMAL HIGH (ref 5–34)
Albumin: 4 g/dL (ref 3.5–5.0)
Alkaline Phosphatase: 93 U/L (ref 40–150)
Anion Gap: 11 mEq/L (ref 3–11)
BUN: 13.9 mg/dL (ref 7.0–26.0)
CALCIUM: 9.2 mg/dL (ref 8.4–10.4)
CHLORIDE: 104 meq/L (ref 98–109)
CO2: 26 meq/L (ref 22–29)
Creatinine: 0.8 mg/dL (ref 0.6–1.1)
EGFR: 85 mL/min/{1.73_m2} — ABNORMAL LOW (ref >90)
Glucose: 118 mg/dl (ref 70–140)
Potassium: 3.8 mEq/L (ref 3.5–5.1)
SODIUM: 141 meq/L (ref 136–145)
TOTAL PROTEIN: 6.6 g/dL (ref 6.4–8.3)
Total Bilirubin: 0.39 mg/dL (ref 0.20–1.20)

## 2015-02-28 MED ORDER — HEPARIN SOD (PORK) LOCK FLUSH 100 UNIT/ML IV SOLN
500.0000 [IU] | Freq: Once | INTRAVENOUS | Status: AC | PRN
  Administered 2015-02-28: 500 [IU]
  Filled 2015-02-28: qty 5

## 2015-02-28 MED ORDER — SODIUM CHLORIDE 0.9 % IV SOLN
508.0000 mg | Freq: Once | INTRAVENOUS | Status: AC
  Administered 2015-02-28: 510 mg via INTRAVENOUS
  Filled 2015-02-28: qty 51

## 2015-02-28 MED ORDER — ACETAMINOPHEN 325 MG PO TABS
ORAL_TABLET | ORAL | Status: AC
  Filled 2015-02-28: qty 2

## 2015-02-28 MED ORDER — SODIUM CHLORIDE 0.9 % IV SOLN
420.0000 mg | Freq: Once | INTRAVENOUS | Status: AC
  Administered 2015-02-28: 420 mg via INTRAVENOUS
  Filled 2015-02-28: qty 14

## 2015-02-28 MED ORDER — ACETAMINOPHEN 325 MG PO TABS
650.0000 mg | ORAL_TABLET | Freq: Once | ORAL | Status: AC
  Administered 2015-02-28: 650 mg via ORAL

## 2015-02-28 MED ORDER — PEGFILGRASTIM 6 MG/0.6ML ~~LOC~~ PSKT
6.0000 mg | PREFILLED_SYRINGE | Freq: Once | SUBCUTANEOUS | Status: AC
  Administered 2015-02-28: 6 mg via SUBCUTANEOUS
  Filled 2015-02-28: qty 0.6

## 2015-02-28 MED ORDER — SODIUM CHLORIDE 0.9 % IV SOLN
6.0000 mg/kg | Freq: Once | INTRAVENOUS | Status: AC
  Administered 2015-02-28: 399 mg via INTRAVENOUS
  Filled 2015-02-28: qty 19

## 2015-02-28 MED ORDER — SODIUM CHLORIDE 0.9 % IJ SOLN
10.0000 mL | INTRAMUSCULAR | Status: DC | PRN
  Administered 2015-02-28: 10 mL
  Filled 2015-02-28: qty 10

## 2015-02-28 MED ORDER — SODIUM CHLORIDE 0.9 % IV SOLN
Freq: Once | INTRAVENOUS | Status: AC
  Administered 2015-02-28: 09:00:00 via INTRAVENOUS

## 2015-02-28 MED ORDER — SODIUM CHLORIDE 0.9 % IV SOLN
Freq: Once | INTRAVENOUS | Status: AC
  Administered 2015-02-28: 09:00:00 via INTRAVENOUS
  Filled 2015-02-28: qty 8

## 2015-02-28 MED ORDER — DEXTROSE 5 % IV SOLN
75.0000 mg/m2 | Freq: Once | INTRAVENOUS | Status: AC
  Administered 2015-02-28: 130 mg via INTRAVENOUS
  Filled 2015-02-28: qty 13

## 2015-02-28 MED ORDER — DIPHENHYDRAMINE HCL 25 MG PO CAPS
ORAL_CAPSULE | ORAL | Status: AC
  Filled 2015-02-28: qty 2

## 2015-02-28 MED ORDER — DIPHENHYDRAMINE HCL 25 MG PO CAPS
50.0000 mg | ORAL_CAPSULE | Freq: Once | ORAL | Status: AC
  Administered 2015-02-28: 50 mg via ORAL

## 2015-02-28 NOTE — Progress Notes (Signed)
[name]  [phone] [phone]     ID: Rhonda Gardner DOB: 03-18-1956  MR#: [id number]  MVE#:[id number]  Patient Care Team: Rita Ohara, MD as PCP - General (Family Medicine) Erroll Luna, MD as Consulting Physician (General Surgery) Chauncey Cruel, MD as Consulting Physician (Oncology) Thea Silversmith, MD as Consulting Physician (Radiation Oncology) Mauro Kaufmann, RN as Registered Nurse Rockwell Germany, RN as Registered Nurse Holley Bouche, NP as Nurse Practitioner (Nurse Practitioner) PCP: Vikki Ports, MD OTHER MD:  CHIEF COMPLAINT: Triple negative breast cancer  CURRENT TREATMENT:  neoadjuvant chemotherapy and anti-HER-2 immunotherapy   BREAST CANCER HISTORY: From the original intake note:  "Rhonda Gardner" had not had a physical exam in quite some time, and had not had a mammogram since 2020, when she was evaluated by Dr. Tomi Bamberger 12/01/2014. Dr. Tomi Bamberger was able to palpate a 2 cm firm mass at the 3:00 position in the left breast. The patient herself has been aware of multiple breast masses and does have a history of fibrocystic change, with multiple prior benign biopsies. She did not feel this particular mass had changed recently.   Dr. Tomi Bamberger arranged for bilateral diagnostic mammography and left breast ultrasonography at the breast Center 12/15/2014. This shows the breast density to be category C. In the left breast upper outer quadrant there was a bilobed mass measuring approximately 2 cm. This was palpable. Ultrasound confirmed an irregular microlobulated and hypoechoic mass measuring 2.1 cm at the 2:00 location 6 cm from the nipple. There was no left axillary adenopathy.  Biopsy of the mass in question 12/19/2014 showed ([id number]) invasive ductal carcinoma, grade 2 or 3, estrogen and progesterone receptor negative, with an MIB-1 of 38%, and equivocal HER-2 amplification, the signals ratio being 1.57, the number per cell 4.25.  On  12/27/2014 the patient underwent bilateral breast MRI. The mass in question at the 2:00 location of the left breast measured 2.5 cm. It directly abuts the left pectoralis major. There is no enhancement in the muscle itself. There were no abnormal appearing lymph nodes. There was an incidentally noted 1 cm hepatic cyst at the dome of the right liver lobe.  Her subsequent history is as detailed below  INTERVAL HISTORY: Rhonda Gardner returns today for follow up of her breast cancer, accompanied by her husband [name]. Today is day 1, cycle 3 of 6 planned cycles of of carboplatin, docetaxel, trastuzumab and pertuzumab given every 3 weeks, with neulasta given on day 2 for granulocyte support. On her week off she went to [place] with her husband and enjoyed her time there.  REVIEW OF SYSTEMS: Rhonda Gardner is feeling well today. She found a wig sewn into a ballcap that she likes and frames her face well. She denies fevers, chills, nausea, or vomiting. Her bowels are back to normal for the most part with occasional runniness. Her mouth sores have not returned, but may with this next treatment. She has magic mouthwash on hand. Her mild sinus symptoms were managed well with zyrtec and sudafed. She is sleeping well with melatonin. She has some increased fatigue this week. Her feet have been sensitive off and on since her last treatment, but no "burlap-like" feeling to the bottoms of them today. A detailed review of systems is otherwise stable.  PAST MEDICAL HISTORY: Past Medical History  Diagnosis Date  . Allergy 2006    chemical cauterization in spring 2006, and 2nd treatment with good results.(Dr.Shoemaker)  . Bipolar disorder   . Breast cancer  12/2014    invasive ductal carcinoma (LEFT)  . Breast cancer of upper-outer quadrant of left female breast 12/22/2014    PAST SURGICAL HISTORY: Past Surgical History  Procedure Laterality Date  . Breast biopsy Bilateral     benign and a right stereotatic breast biopsy.  . Foot  surgery Left 2014    Dr. Paulla Dolly  . Fibroid excision  age 45    prolapsed fibroid removed    FAMILY HISTORY Family History  Problem Relation Age of Onset  . Cancer Father     skin - non melanoma - farmer  . Heart disease Father   . Hypertension Mother   . Arthritis Mother     osteoarthritis  . Colon cancer Mother   . Macular degeneration Mother     wet and dry  . Cancer Mother 61    colon cancer at age 75  . Osteoporosis Sister     osteopenia  . Cancer Maternal Aunt     lung cancer  . Diabetes Paternal Aunt   . Cancer Paternal Aunt 69    ? stomach cancer  . Autism Sister   . Mental retardation Sister   . Blindness Sister     secondary to retrolental fibroplasia  . Goiter Paternal Aunt    the patient's father lived to be 95. He had a history of nonmelanoma skin cancers. [name] mother is living at age 61. She was diagnosed with colon cancer in her 8s. A paternal aunt may have had stomach cancer. Maternal aunt developed lung cancer in her 50s. The patient has one brother, 2 sisters. One of her sisters is blind and mentally retarded. There is no history of breast or ovarian cancer in the family.  GYNECOLOGIC HISTORY:  No LMP recorded. Patient is postmenopausal. Menarche age 25. The patient is GX P0. She stopped having periods in her early 18s. She did not take hormone replacement. She took oral contraceptives remotely for over 5 years with no complications.  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 Her husband [name] worked in IT originally but now runs a family firm. It's just the 2 of them at home, plus a chocolate lab.    ADVANCED DIRECTIVES: In place   HEALTH MAINTENANCE: History  Substance Use Topics  . Smoking status: Never Smoker   . Smokeless tobacco: Never Used  . Alcohol Use: No     Colonoscopy: In [place] under Dr. Lavina Hamman, date not entirely clear  PAP: February 2016  Bone density: Under S[facility] Radon, results not currently  available  Lipid panel:  Allergies  Allergen Reactions  . Adhesive [Tape] Rash    Pt is sensitive to any kind of adhesive tape products.    Current Outpatient Prescriptions  Medication Sig Dispense Refill  . Cholecalciferol (VITAMIN D) 2000 UNITS tablet Take 2,000 Units by mouth daily.    . Cyanocobalamin (VITAMIN B-12) 5000 MCG SUBL Place 5,000 mcg under the tongue daily.    Marland Kitchen dexamethasone (DECADRON) 4 MG tablet Take 2 tablets (8 mg total) by mouth 2 (two) times daily. Start the day before Taxotere. Then again the day after chemo for 3 days. 30 tablet 1  . Diphenhyd-Hydrocort-Nystatin (FIRST-DUKES MOUTHWASH) SUSP 5-10 ml QID prn swish swallow or spit 240 mL 3  . fluconazole (DIFLUCAN) 100 MG tablet Take 1 tablet (100 mg total) by mouth daily. 30 tablet 3  . LAMICTAL 200 MG tablet Take 0.5 tablets (100 mg total) by mouth daily. 90 tablet 0  .  lidocaine-prilocaine (EMLA) cream Apply to affected area once 30 g 3  . loperamide (IMODIUM) 2 MG capsule Take 1 capsule (2 mg total) by mouth as needed for diarrhea or loose stools. 30 capsule 12  . loratadine (CLARITIN) 10 MG tablet Take one tablet on the day of neulasta shot, and repeat the next 2 days 100 tablet 3  . LUTEIN-ZEAXANTHIN PO Take 1 tablet by mouth daily.    . Multiple Vitamins-Minerals (PRESERVISION AREDS) CAPS Take 1 capsule by mouth 2 (two) times daily.    Marland Kitchen omeprazole (PRILOSEC) 40 MG capsule Take 1 capsule (40 mg total) by mouth daily. 30 capsule 2  . ondansetron (ZOFRAN) 8 MG tablet Take 1 tablet (8 mg total) by mouth 2 (two) times daily. Start the day after chemo for 3 days. Then take as needed for nausea or vomiting. 30 tablet 1  . sucralfate (CARAFATE) 1 GM/10ML suspension Take 10 mLs (1 g total) by mouth 4 (four) times daily as needed. 420 mL 0  . tobramycin-dexamethasone (TOBRADEX) ophthalmic solution Place 1 drop into both eyes 2 (two) times daily. 5 mL 0  . valACYclovir (VALTREX) 1000 MG tablet Take 1 tablet (1,000 mg  total) by mouth daily. 30 tablet 1  . Alum & Mag Hydroxide-Simeth (MAGIC MOUTHWASH W/LIDOCAINE) SOLN Take 5 mLs by mouth 4 (four) times daily as needed for mouth pain ([facility] mouthwash with nystatin and lidocaine.). (Patient not taking: Reported on 02/14/2015) 240 mL 0  . cholestyramine (QUESTRAN) 4 G packet Take 1 packet (4 g total) by mouth 3 (three) times daily with meals. (Patient not taking: Reported on 02/14/2015) 60 each 12  . LORazepam (ATIVAN) 0.5 MG tablet Take 1 tablet (0.5 mg total) by mouth at bedtime as needed (Nausea or vomiting). (Patient not taking: Reported on 02/28/2015) 30 tablet 0  . naproxen sodium (ANAPROX) 220 MG tablet Take 220-440 mg by mouth daily as needed (pain). Aleve    . prochlorperazine (COMPAZINE) 10 MG tablet Take 1 tablet (10 mg total) by mouth every 6 (six) hours as needed (Nausea or vomiting). (Patient not taking: Reported on 01/27/2015) 30 tablet 1   No current facility-administered medications for this visit.    OBJECTIVE: Middle-aged white woman in no acute distress  Filed Vitals:   02/28/15 0827  BP: 117/67  Pulse: 80  Temp: 97.9 F (36.6 C)  Resp: 18     Body mass index is 20.93 kg/(m^2).    ECOG FS:1 - Symptomatic but completely ambulatory   Sclerae unicteric, pupils equal and reactive Oropharynx clear and moist-- no thrush No cervical or supraclavicular adenopathy Lungs no rales or rhonchi Heart regular rate and rhythm Abd soft, nontender, positive bowel sounds MSK no focal spinal tenderness, no upper extremity lymphedema Neuro: nonfocal, well oriented, appropriate affect Breasts: deferred   LAB RESULTS:  CMP     Component Value Date/Time   NA 141 02/28/2015 0810   NA 142 01/13/2015 0822   K 3.8 02/28/2015 0810   K 3.8 01/13/2015 0822   CL 106 01/13/2015 0822   CO2 26 02/28/2015 0810   CO2 26 01/13/2015 0822   GLUCOSE 118 02/28/2015 0810   GLUCOSE 83 01/13/2015 0822   BUN 13.9 02/28/2015 0810   BUN 8 01/13/2015 0822   CREATININE  0.8 02/28/2015 0810   CREATININE 0.77 01/13/2015 0822   CREATININE 0.80 12/01/2014 1437   CALCIUM 9.2 02/28/2015 0810   CALCIUM 9.1 01/13/2015 0822   PROT 6.6 02/28/2015 0810   PROT 7.0 12/01/2014 1437  ALBUMIN 4.0 02/28/2015 0810   ALBUMIN 4.8 12/01/2014 1437   AST 52* 02/28/2015 0810   AST 28 12/01/2014 1437   ALT 97* 02/28/2015 0810   ALT 24 12/01/2014 1437   ALKPHOS 93 02/28/2015 0810   ALKPHOS 65 12/01/2014 1437   BILITOT 0.39 02/28/2015 0810   BILITOT 0.6 12/01/2014 1437   GFRNONAA 89* 01/13/2015 0822   GFRAA >90 01/13/2015 0822    INo results found for: SPEP, UPEP  Lab Results  Component Value Date   WBC 7.3 02/28/2015   NEUTROABS 6.7* 02/28/2015   HGB 10.3* 02/28/2015   HCT 31.3* 02/28/2015   MCV 94.3 02/28/2015   PLT 216 02/28/2015      Chemistry      Component Value Date/Time   NA 141 02/28/2015 0810   NA 142 01/13/2015 0822   K 3.8 02/28/2015 0810   K 3.8 01/13/2015 0822   CL 106 01/13/2015 0822   CO2 26 02/28/2015 0810   CO2 26 01/13/2015 0822   BUN 13.9 02/28/2015 0810   BUN 8 01/13/2015 0822   CREATININE 0.8 02/28/2015 0810   CREATININE 0.77 01/13/2015 0822   CREATININE 0.80 12/01/2014 1437      Component Value Date/Time   CALCIUM 9.2 02/28/2015 0810   CALCIUM 9.1 01/13/2015 0822   ALKPHOS 93 02/28/2015 0810   ALKPHOS 65 12/01/2014 1437   AST 52* 02/28/2015 0810   AST 28 12/01/2014 1437   ALT 97* 02/28/2015 0810   ALT 24 12/01/2014 1437   BILITOT 0.39 02/28/2015 0810   BILITOT 0.6 12/01/2014 1437       No results found for: LABCA2  No components found for: TGPQD826  No results for input(s): INR in the last 168 hours.  Urinalysis    Component Value Date/Time   LABSPEC 1.010 08/09/2007 2121   PHURINE 7.5 08/09/2007 2121   GLUCOSEU NEGATIVE 08/09/2007 2121   HGBUR SMALL* 08/09/2007 2121   BILIRUBINUR neg 12/01/2014 [place] 08/09/2007 2121   KETONESUR NEGATIVE 08/09/2007 2121   PROTEINUR neg 12/01/2014 [place] 08/09/2007 2121   UROBILINOGEN negative 12/01/2014 1358   UROBILINOGEN 0.2 08/09/2007 2121   NITRITE neg 12/01/2014 1358   NITRITE NEGATIVE 08/09/2007 2121   LEUKOCYTESUR Negative 12/01/2014 1358    STUDIES: No results found.  ASSESSMENT: 59 y.o. [place] woman s/p left breast upper outer quadrant biopsy 12/19/2014 for a clinical T2 N0, stage IIA invasive ductal carcinoma, grade 2 or 3, estrogen and progesterone receptor negative, with an MIB-1 of 38%, HER-2 equivocal initially but positive on further testing with a ratio of 3.4  (1) neoadjuvant chemotherapy to consist of carboplatin, docetaxel, trastuzumab and pertuzumab given every 3 weeks for 6 cycles with Neulasta support--started 01/16/2015  (2) definitive surgery to follow chemotherapy  (3) radiation to follow surgery  (4) genetics counselor felt the risk of a familial mutation was sufficiently low testing could be omitted  PLAN: Rhonda Gardner is in great spirits today. The labs were reviewed in detail and she has some treatment related anemia, but they are otherwise stable. She will proceed with cycle 3 of carboplatin, docetaxel, trastuzumab, and pertuzumab. She will continue to monitor her transient neuropathy symptoms, and if necessary we will remove the docetaxel from future cycles.   Rhonda Gardner will return in 1 week for labs and a nadir visit. She understands and agrees with this plan. She knows the goal of treatment in her case is cure. She has been encouraged to call with  any issues that might arise before her next visit here.   Laurie Panda, NP   02/28/2015 8:54 AM

## 2015-02-28 NOTE — Patient Instructions (Signed)
[facility] Discharge Instructions for Patients Receiving Chemotherapy  Today you received the following chemotherapy agents: Herceptin, Perjeta, Taxotere, Carboplatin  To help prevent nausea and vomiting after your treatment, we encourage you to take your nausea medication as prescribed by your physician.   If you develop nausea and vomiting that is not controlled by your nausea medication, call the clinic.   BELOW ARE SYMPTOMS THAT SHOULD BE REPORTED IMMEDIATELY:  *FEVER GREATER THAN 100.5 F  *CHILLS WITH OR WITHOUT FEVER  NAUSEA AND VOMITING THAT IS NOT CONTROLLED WITH YOUR NAUSEA MEDICATION  *UNUSUAL SHORTNESS OF BREATH  *UNUSUAL BRUISING OR BLEEDING  TENDERNESS IN MOUTH AND THROAT WITH OR WITHOUT PRESENCE OF ULCERS  *URINARY PROBLEMS  *BOWEL PROBLEMS  UNUSUAL RASH Items with * indicate a potential emergency and should be followed up as soon as possible.  Feel free to call the clinic you have any questions or concerns. The clinic phone number is [phone].  Please show the CHEMO ALERT CARD at check-in to the Emergency Department and triage nurse.   

## 2015-03-01 ENCOUNTER — Telehealth: Payer: Self-pay | Admitting: *Deleted

## 2015-03-01 NOTE — Telephone Encounter (Signed)
Per staff message and POF I have scheduled appts. Advised scheduler of appts. JMW  

## 2015-03-02 ENCOUNTER — Other Ambulatory Visit: Payer: Self-pay | Admitting: Oncology

## 2015-03-06 ENCOUNTER — Ambulatory Visit (HOSPITAL_BASED_OUTPATIENT_CLINIC_OR_DEPARTMENT_OTHER): Payer: BLUE CROSS/BLUE SHIELD | Admitting: Nurse Practitioner

## 2015-03-06 ENCOUNTER — Other Ambulatory Visit (HOSPITAL_BASED_OUTPATIENT_CLINIC_OR_DEPARTMENT_OTHER): Payer: BLUE CROSS/BLUE SHIELD

## 2015-03-06 ENCOUNTER — Encounter: Payer: Self-pay | Admitting: Nurse Practitioner

## 2015-03-06 VITALS — BP 106/59 | HR 96 | Temp 97.9°F | Resp 18 | Ht 68.0 in | Wt 136.4 lb

## 2015-03-06 DIAGNOSIS — C50412 Malignant neoplasm of upper-outer quadrant of left female breast: Secondary | ICD-10-CM | POA: Diagnosis not present

## 2015-03-06 DIAGNOSIS — T451X5A Adverse effect of antineoplastic and immunosuppressive drugs, initial encounter: Secondary | ICD-10-CM | POA: Insufficient documentation

## 2015-03-06 DIAGNOSIS — G620 Drug-induced polyneuropathy: Secondary | ICD-10-CM | POA: Insufficient documentation

## 2015-03-06 DIAGNOSIS — Z171 Estrogen receptor negative status [ER-]: Secondary | ICD-10-CM

## 2015-03-06 DIAGNOSIS — G4700 Insomnia, unspecified: Secondary | ICD-10-CM

## 2015-03-06 LAB — COMPREHENSIVE METABOLIC PANEL (CC13)
ALBUMIN: 4 g/dL (ref 3.5–5.0)
ALK PHOS: 94 U/L (ref 40–150)
ALT: 96 U/L — AB (ref 0–55)
ANION GAP: 11 meq/L (ref 3–11)
AST: 27 U/L (ref 5–34)
BUN: 18.1 mg/dL (ref 7.0–26.0)
CO2: 27 meq/L (ref 22–29)
Calcium: 8.7 mg/dL (ref 8.4–10.4)
Chloride: 99 mEq/L (ref 98–109)
Creatinine: 0.9 mg/dL (ref 0.6–1.1)
EGFR: 66 mL/min/{1.73_m2} — ABNORMAL LOW (ref >90)
Glucose: 99 mg/dl (ref 70–140)
Potassium: 4.1 mEq/L (ref 3.5–5.1)
Sodium: 138 mEq/L (ref 136–145)
Total Bilirubin: 0.39 mg/dL (ref 0.20–1.20)
Total Protein: 6.3 g/dL — ABNORMAL LOW (ref 6.4–8.3)

## 2015-03-06 LAB — CBC WITH DIFFERENTIAL/PLATELET
BASO%: 0.1 % (ref 0.0–2.0)
Basophils Absolute: 0 10*3/uL (ref 0.0–0.1)
EOS ABS: 0 10*3/uL (ref 0.0–0.5)
EOS%: 0.3 % (ref 0.0–7.0)
HEMATOCRIT: 32.5 % — AB (ref 34.8–46.6)
HGB: 10.8 g/dL — ABNORMAL LOW (ref 11.6–15.9)
LYMPH%: 23.6 % (ref 14.0–49.7)
MCH: 31.5 pg (ref 25.1–34.0)
MCHC: 33 g/dL (ref 31.5–36.0)
MCV: 95.4 fL (ref 79.5–101.0)
MONO#: 0.4 10*3/uL (ref 0.1–0.9)
MONO%: 10.5 % (ref 0.0–14.0)
NEUT%: 65.5 % (ref 38.4–76.8)
NEUTROS ABS: 2.3 10*3/uL (ref 1.5–6.5)
Platelets: 168 10*3/uL (ref 145–400)
RBC: 3.41 10*6/uL — AB (ref 3.70–5.45)
RDW: 16.5 % — ABNORMAL HIGH (ref 11.2–14.5)
WBC: 3.5 10*3/uL — ABNORMAL LOW (ref 3.9–10.3)
lymph#: 0.8 10*3/uL — ABNORMAL LOW (ref 0.9–3.3)

## 2015-03-06 NOTE — Progress Notes (Signed)
[place]  Telephone:([phone]) [phone] Fax:(336) [phone]     ID: Rhonda Gardner DOB: 12/13/1955  MR#: [id number]  DXI#:[id number]  Patient Care Team: Rita Ohara, MD as PCP - General (Family Medicine) Erroll Luna, MD as Consulting Physician (General Surgery) Chauncey Cruel, MD as Consulting Physician (Oncology) Thea Silversmith, MD as Consulting Physician (Radiation Oncology) Mauro Kaufmann, RN as Registered Nurse Rockwell Germany, RN as Registered Nurse Holley Bouche, NP as Nurse Practitioner (Nurse Practitioner) PCP: Vikki Ports, MD OTHER MD:  CHIEF COMPLAINT: Triple negative breast cancer  CURRENT TREATMENT:  neoadjuvant chemotherapy and anti-HER-2 immunotherapy   BREAST CANCER HISTORY: From the original intake note:  "Rhonda Gardner" had not had a physical exam in quite some time, and had not had a mammogram since 2020, when she was evaluated by Dr. Tomi Bamberger 12/01/2014. Dr. Tomi Bamberger was able to palpate a 2 cm firm mass at the 3:00 position in the left breast. The patient herself has been aware of multiple breast masses and does have a history of fibrocystic change, with multiple prior benign biopsies. She did not feel this particular mass had changed recently.   Dr. Tomi Bamberger arranged for bilateral diagnostic mammography and left breast ultrasonography at the [facility] Center 12/15/2014. This shows the breast density to be category C. In the left breast upper outer quadrant there was a bilobed mass measuring approximately 2 cm. This was palpable. Ultrasound confirmed an irregular microlobulated and hypoechoic mass measuring 2.1 cm at the 2:00 location 6 cm from the nipple. There was no left axillary adenopathy.  Biopsy of the mass in question 12/19/2014 showed ([id number]) invasive ductal carcinoma, grade 2 or 3, estrogen and progesterone receptor negative, with an MIB-1 of 38%, and equivocal HER-2 amplification, the signals ratio being 1.57, the number per cell 4.25.  On  12/27/2014 the patient underwent bilateral breast MRI. The mass in question at the 2:00 location of the left breast measured 2.5 cm. It directly abuts the left pectoralis major. There is no enhancement in the muscle itself. There were no abnormal appearing lymph nodes. There was an incidentally noted 1 cm hepatic cyst at the dome of the right liver lobe.  Her subsequent history is as detailed below  INTERVAL HISTORY: Rhonda Gardner returns today for follow up of her breast cancer, accompanied by her husband Rhonda Gardner. Today is day 8, cycle 3 of 6 planned cycles of of carboplatin, docetaxel, trastuzumab and pertuzumab given every 3 weeks, with neulasta given on day 2 for granulocyte support.  REVIEW OF SYSTEMS: Rhonda Gardner denies fevers, chills, nausea, or vomiting. She has had some consistently loose stools for the past few days, but has been hesitant about using the imodium. She has taste changes that are altering her appetite. Her tongue is tender, but no sores are  present. She is using the magic mouthwash PRN. Her energy levels are affected and she hasn't been sleeping the best this week. Her fingers and soles of her feet have been numb intermittently this past week, possibly increased since last week. A detailed review of systems is otherwise stable.   PAST MEDICAL HISTORY: Past Medical History  Diagnosis Date  . Allergy 2006    chemical cauterization in spring 2006, and 2nd treatment with good results.(Dr.Shoemaker)  . Bipolar disorder   . Breast cancer 12/2014    invasive ductal carcinoma (LEFT)  . Breast cancer of upper-outer quadrant of left female breast 12/22/2014    PAST SURGICAL HISTORY: Past Surgical History  Procedure Laterality Date  .  Breast biopsy Bilateral     benign and a right stereotatic breast biopsy.  . Foot surgery Left 2014    Dr. Paulla Dolly  . Fibroid excision  age 30    prolapsed fibroid removed    FAMILY HISTORY Family History  Problem Relation Age of Onset  . Cancer Father      skin - non melanoma - farmer  . Heart disease Father   . Hypertension Mother   . Arthritis Mother     osteoarthritis  . Colon cancer Mother   . Macular degeneration Mother     wet and dry  . Cancer Mother 57    colon cancer at age 62  . Osteoporosis Sister     osteopenia  . Cancer Maternal Aunt     lung cancer  . Diabetes Paternal Aunt   . Cancer Paternal Aunt 51    ? stomach cancer  . Autism Sister   . Mental retardation Sister   . Blindness Sister     secondary to retrolental fibroplasia  . Goiter Paternal Aunt    the patient's father lived to be 95. He had a history of nonmelanoma skin cancers. [name] mother is living at age 64. She was diagnosed with colon cancer in her 57s. A paternal aunt may have had stomach cancer. Maternal aunt developed lung cancer in her 67s. The patient has one brother, 2 sisters. One of her sisters is blind and mentally retarded. There is no history of breast or ovarian cancer in the family.  GYNECOLOGIC HISTORY:  No LMP recorded. Patient is postmenopausal. Menarche age 78. The patient is GX P0. She stopped having periods in her early 50s. She did not take hormone replacement. She took oral contraceptives remotely for over 5 years with no complications.  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 Her husband Rhonda Gardner worked in IT originally but now runs a family firm. It's just the 2 of them at home, plus a chocolate lab.    ADVANCED DIRECTIVES: In place   HEALTH MAINTENANCE: History  Substance Use Topics  . Smoking status: Never Smoker   . Smokeless tobacco: Never Used  . Alcohol Use: No     Colonoscopy: In [place] under Dr. Lavina Hamman, date not entirely clear  PAP: February 2016  Bone density: Under S[facility] Radon, results not currently available  Lipid panel:  Allergies  Allergen Reactions  . Adhesive [Tape] Rash    Pt is sensitive to any kind of adhesive tape products.    Current Outpatient Prescriptions    Medication Sig Dispense Refill  . Cholecalciferol (VITAMIN D) 2000 UNITS tablet Take 2,000 Units by mouth daily.    . Cyanocobalamin (VITAMIN B-12) 5000 MCG SUBL Place 5,000 mcg under the tongue daily.    . Diphenhyd-Hydrocort-Nystatin (FIRST-DUKES MOUTHWASH) SUSP 5-10 ml QID prn swish swallow or spit 240 mL 3  . fluconazole (DIFLUCAN) 100 MG tablet Take 1 tablet (100 mg total) by mouth daily. 30 tablet 3  . LAMICTAL 200 MG tablet Take 0.5 tablets (100 mg total) by mouth daily. 90 tablet 0  . loperamide (IMODIUM) 2 MG capsule Take 1 capsule (2 mg total) by mouth as needed for diarrhea or loose stools. 30 capsule 12  . loratadine (CLARITIN) 10 MG tablet Take one tablet on the day of neulasta shot, and repeat the next 2 days 100 tablet 3  . LORazepam (ATIVAN) 0.5 MG tablet Take 1 tablet (0.5 mg total) by mouth at bedtime as needed (Nausea or  vomiting). 30 tablet 0  . LUTEIN-ZEAXANTHIN PO Take 1 tablet by mouth daily.    . Multiple Vitamins-Minerals (PRESERVISION AREDS) CAPS Take 1 capsule by mouth 2 (two) times daily.    . naproxen sodium (ANAPROX) 220 MG tablet Take 220-440 mg by mouth daily as needed (pain). Aleve    . omeprazole (PRILOSEC) 40 MG capsule Take 1 capsule (40 mg total) by mouth daily. 30 capsule 2  . tobramycin-dexamethasone (TOBRADEX) ophthalmic solution PLACE 1 DROP INTO BOTH EYES TWICE DAILY. 5 mL 0  . valACYclovir (VALTREX) 1000 MG tablet Take 1 tablet (1,000 mg total) by mouth daily. 30 tablet 1  . Alum & Mag Hydroxide-Simeth (MAGIC MOUTHWASH W/LIDOCAINE) SOLN Take 5 mLs by mouth 4 (four) times daily as needed for mouth pain ([facility] mouthwash with nystatin and lidocaine.). (Patient not taking: Reported on 02/14/2015) 240 mL 0  . cholestyramine (QUESTRAN) 4 G packet Take 1 packet (4 g total) by mouth 3 (three) times daily with meals. (Patient not taking: Reported on 02/14/2015) 60 each 12  . dexamethasone (DECADRON) 4 MG tablet Take 2 tablets (8 mg total) by mouth 2 (two) times  daily. Start the day before Taxotere. Then again the day after chemo for 3 days. (Patient not taking: Reported on 03/06/2015) 30 tablet 1  . lidocaine-prilocaine (EMLA) cream Apply to affected area once (Patient not taking: Reported on 03/06/2015) 30 g 3  . ondansetron (ZOFRAN) 8 MG tablet Take 1 tablet (8 mg total) by mouth 2 (two) times daily. Start the day after chemo for 3 days. Then take as needed for nausea or vomiting. (Patient not taking: Reported on 03/06/2015) 30 tablet 1  . prochlorperazine (COMPAZINE) 10 MG tablet Take 1 tablet (10 mg total) by mouth every 6 (six) hours as needed (Nausea or vomiting). (Patient not taking: Reported on 01/27/2015) 30 tablet 1  . sucralfate (CARAFATE) 1 GM/10ML suspension Take 10 mLs (1 g total) by mouth 4 (four) times daily as needed. (Patient not taking: Reported on 03/06/2015) 420 mL 0   No current facility-administered medications for this visit.    OBJECTIVE: Middle-aged white woman in no acute distress  Filed Vitals:   03/06/15 1414  BP: 106/59  Pulse: 96  Temp: 97.9 F (36.6 C)  Resp: 18     Body mass index is 20.74 kg/(m^2).    ECOG FS:1 - Symptomatic but completely ambulatory   Skin: warm, dry  HEENT: sclerae anicteric, conjunctivae pink, oropharynx clear. No thrush or mucositis.  Lymph Nodes: No cervical or supraclavicular lymphadenopathy  Lungs: clear to auscultation bilaterally, no rales, wheezes, or rhonci  Heart: regular rate and rhythm  Abdomen: round, soft, non tender, positive bowel sounds  Musculoskeletal: No focal spinal tenderness, no peripheral edema  Neuro: non focal, well oriented, positive affect  Breasts: deferred   LAB RESULTS:  CMP     Component Value Date/Time   NA 138 03/06/2015 1354   NA 142 01/13/2015 0822   K 4.1 03/06/2015 1354   K 3.8 01/13/2015 0822   CL 106 01/13/2015 0822   CO2 27 03/06/2015 1354   CO2 26 01/13/2015 0822   GLUCOSE 99 03/06/2015 1354   GLUCOSE 83 01/13/2015 0822   BUN 18.1 03/06/2015  1354   BUN 8 01/13/2015 0822   CREATININE 0.9 03/06/2015 1354   CREATININE 0.77 01/13/2015 0822   CREATININE 0.80 12/01/2014 1437   CALCIUM 8.7 03/06/2015 1354   CALCIUM 9.1 01/13/2015 0822   PROT 6.3* 03/06/2015 1354   PROT 7.0  12/01/2014 1437   ALBUMIN 4.0 03/06/2015 1354   ALBUMIN 4.8 12/01/2014 1437   AST 27 03/06/2015 1354   AST 28 12/01/2014 1437   ALT 96* 03/06/2015 1354   ALT 24 12/01/2014 1437   ALKPHOS 94 03/06/2015 1354   ALKPHOS 65 12/01/2014 1437   BILITOT 0.39 03/06/2015 1354   BILITOT 0.6 12/01/2014 1437   GFRNONAA 89* 01/13/2015 0822   GFRAA >90 01/13/2015 0822    INo results found for: SPEP, UPEP  Lab Results  Component Value Date   WBC 3.5* 03/06/2015   NEUTROABS 2.3 03/06/2015   HGB 10.8* 03/06/2015   HCT 32.5* 03/06/2015   MCV 95.4 03/06/2015   PLT 168 03/06/2015      Chemistry      Component Value Date/Time   NA 138 03/06/2015 1354   NA 142 01/13/2015 0822   K 4.1 03/06/2015 1354   K 3.8 01/13/2015 0822   CL 106 01/13/2015 0822   CO2 27 03/06/2015 1354   CO2 26 01/13/2015 0822   BUN 18.1 03/06/2015 1354   BUN 8 01/13/2015 0822   CREATININE 0.9 03/06/2015 1354   CREATININE 0.77 01/13/2015 0822   CREATININE 0.80 12/01/2014 1437      Component Value Date/Time   CALCIUM 8.7 03/06/2015 1354   CALCIUM 9.1 01/13/2015 0822   ALKPHOS 94 03/06/2015 1354   ALKPHOS 65 12/01/2014 1437   AST 27 03/06/2015 1354   AST 28 12/01/2014 1437   ALT 96* 03/06/2015 1354   ALT 24 12/01/2014 1437   BILITOT 0.39 03/06/2015 1354   BILITOT 0.6 12/01/2014 1437       No results found for: LABCA2  No components found for: LABCA125  No results for input(s): INR in the last 168 hours.  Urinalysis    Component Value Date/Time   LABSPEC 1.010 08/09/2007 2121   PHURINE 7.5 08/09/2007 2121   GLUCOSEU NEGATIVE 08/09/2007 2121   HGBUR SMALL* 08/09/2007 2121   BILIRUBINUR neg 12/01/2014 [place] 08/09/2007 2121   KETONESUR NEGATIVE  08/09/2007 2121   PROTEINUR neg 12/01/2014 [place] 08/09/2007 2121   UROBILINOGEN negative 12/01/2014 1358   UROBILINOGEN 0.2 08/09/2007 2121   NITRITE neg 12/01/2014 1358   NITRITE NEGATIVE 08/09/2007 2121   LEUKOCYTESUR Negative 12/01/2014 1358    STUDIES: No results found.  ASSESSMENT: 59 y.o. [place] woman s/p left breast upper outer quadrant biopsy 12/19/2014 for a clinical T2 N0, stage IIA invasive ductal carcinoma, grade 2 or 3, estrogen and progesterone receptor negative, with an MIB-1 of 38%, HER-2 equivocal initially but positive on further testing with a ratio of 3.4  (1) neoadjuvant chemotherapy to consist of carboplatin, docetaxel, trastuzumab and pertuzumab given every 3 weeks for 6 cycles with Neulasta support--started 01/16/2015  (2) definitive surgery to follow chemotherapy  (3) radiation to follow surgery  (4) genetics counselor felt the risk of a familial mutation was sufficiently low testing could be omitted  PLAN: Rhonda Gardner overall is doing well today. The labs were reviewed in detail and were entirely stable. I advised she take her imodium when she gets home to control her bowels better. She will also add warm salt water rinses to her regimen for her sensitive tongue.   Rhonda Gardner will return in 2 week for the start of cycle 3 of treatment. If her neuropathy progresses in the meantime, adjustments may have to be made to the docetaxel. She understands and agrees with this plan. She knows the goal of treatment  in her case is cure. She has been encouraged to call with any issues that might arise before her next visit here.   Laurie Panda, NP   03/06/2015 3:21 PM

## 2015-03-20 ENCOUNTER — Ambulatory Visit (HOSPITAL_BASED_OUTPATIENT_CLINIC_OR_DEPARTMENT_OTHER): Payer: BLUE CROSS/BLUE SHIELD

## 2015-03-20 ENCOUNTER — Other Ambulatory Visit (HOSPITAL_BASED_OUTPATIENT_CLINIC_OR_DEPARTMENT_OTHER): Payer: BLUE CROSS/BLUE SHIELD

## 2015-03-20 ENCOUNTER — Ambulatory Visit (HOSPITAL_BASED_OUTPATIENT_CLINIC_OR_DEPARTMENT_OTHER): Payer: BLUE CROSS/BLUE SHIELD | Admitting: Oncology

## 2015-03-20 VITALS — BP 112/73 | HR 75 | Temp 98.1°F | Resp 16

## 2015-03-20 VITALS — BP 134/71 | HR 89 | Temp 98.0°F | Resp 18 | Ht 68.0 in | Wt 138.3 lb

## 2015-03-20 DIAGNOSIS — Z5189 Encounter for other specified aftercare: Secondary | ICD-10-CM | POA: Diagnosis not present

## 2015-03-20 DIAGNOSIS — Z171 Estrogen receptor negative status [ER-]: Secondary | ICD-10-CM

## 2015-03-20 DIAGNOSIS — C50412 Malignant neoplasm of upper-outer quadrant of left female breast: Secondary | ICD-10-CM | POA: Diagnosis not present

## 2015-03-20 DIAGNOSIS — Z5112 Encounter for antineoplastic immunotherapy: Secondary | ICD-10-CM | POA: Diagnosis not present

## 2015-03-20 DIAGNOSIS — G620 Drug-induced polyneuropathy: Secondary | ICD-10-CM

## 2015-03-20 DIAGNOSIS — Z5111 Encounter for antineoplastic chemotherapy: Secondary | ICD-10-CM

## 2015-03-20 LAB — COMPREHENSIVE METABOLIC PANEL (CC13)
ALBUMIN: 3.9 g/dL (ref 3.5–5.0)
ALT: 115 U/L — ABNORMAL HIGH (ref 0–55)
AST: 56 U/L — AB (ref 5–34)
Alkaline Phosphatase: 101 U/L (ref 40–150)
Anion Gap: 10 mEq/L (ref 3–11)
BILIRUBIN TOTAL: 0.47 mg/dL (ref 0.20–1.20)
BUN: 14.9 mg/dL (ref 7.0–26.0)
CHLORIDE: 110 meq/L — AB (ref 98–109)
CO2: 22 mEq/L (ref 22–29)
Calcium: 9.4 mg/dL (ref 8.4–10.4)
Creatinine: 0.8 mg/dL (ref 0.6–1.1)
EGFR: 75 mL/min/{1.73_m2} — ABNORMAL LOW (ref >90)
Glucose: 117 mg/dl (ref 70–140)
POTASSIUM: 3.6 meq/L (ref 3.5–5.1)
Sodium: 141 mEq/L (ref 136–145)
Total Protein: 6.5 g/dL (ref 6.4–8.3)

## 2015-03-20 LAB — CBC WITH DIFFERENTIAL/PLATELET
BASO%: 0 % (ref 0.0–2.0)
Basophils Absolute: 0 10*3/uL (ref 0.0–0.1)
EOS%: 0 % (ref 0.0–7.0)
Eosinophils Absolute: 0 10*3/uL (ref 0.0–0.5)
HEMATOCRIT: 32.5 % — AB (ref 34.8–46.6)
HGB: 10.7 g/dL — ABNORMAL LOW (ref 11.6–15.9)
LYMPH%: 3.5 % — ABNORMAL LOW (ref 14.0–49.7)
MCH: 32.1 pg (ref 25.1–34.0)
MCHC: 32.9 g/dL (ref 31.5–36.0)
MCV: 97.6 fL (ref 79.5–101.0)
MONO#: 0.2 10*3/uL (ref 0.1–0.9)
MONO%: 3 % (ref 0.0–14.0)
NEUT#: 7.5 10*3/uL — ABNORMAL HIGH (ref 1.5–6.5)
NEUT%: 93.5 % — AB (ref 38.4–76.8)
PLATELETS: 192 10*3/uL (ref 145–400)
RBC: 3.33 10*6/uL — ABNORMAL LOW (ref 3.70–5.45)
RDW: 17.3 % — AB (ref 11.2–14.5)
WBC: 8 10*3/uL (ref 3.9–10.3)
lymph#: 0.3 10*3/uL — ABNORMAL LOW (ref 0.9–3.3)

## 2015-03-20 MED ORDER — SODIUM CHLORIDE 0.9 % IJ SOLN
10.0000 mL | INTRAMUSCULAR | Status: DC | PRN
  Administered 2015-03-20: 10 mL
  Filled 2015-03-20: qty 10

## 2015-03-20 MED ORDER — DIPHENHYDRAMINE HCL 25 MG PO CAPS
ORAL_CAPSULE | ORAL | Status: AC
  Filled 2015-03-20: qty 1

## 2015-03-20 MED ORDER — PEGFILGRASTIM 6 MG/0.6ML ~~LOC~~ PSKT
6.0000 mg | PREFILLED_SYRINGE | Freq: Once | SUBCUTANEOUS | Status: AC
  Administered 2015-03-20: 6 mg via SUBCUTANEOUS
  Filled 2015-03-20: qty 0.6

## 2015-03-20 MED ORDER — HEPARIN SOD (PORK) LOCK FLUSH 100 UNIT/ML IV SOLN
500.0000 [IU] | Freq: Once | INTRAVENOUS | Status: AC | PRN
  Administered 2015-03-20: 500 [IU]
  Filled 2015-03-20: qty 5

## 2015-03-20 MED ORDER — SODIUM CHLORIDE 0.9 % IV SOLN
Freq: Once | INTRAVENOUS | Status: AC
  Administered 2015-03-20: 10:00:00 via INTRAVENOUS
  Filled 2015-03-20: qty 8

## 2015-03-20 MED ORDER — CARBOPLATIN CHEMO INJECTION 600 MG/60ML
508.0000 mg | Freq: Once | INTRAVENOUS | Status: AC
  Administered 2015-03-20: 510 mg via INTRAVENOUS
  Filled 2015-03-20: qty 51

## 2015-03-20 MED ORDER — TRASTUZUMAB CHEMO INJECTION 440 MG
6.0000 mg/kg | Freq: Once | INTRAVENOUS | Status: AC
  Administered 2015-03-20: 399 mg via INTRAVENOUS
  Filled 2015-03-20: qty 19

## 2015-03-20 MED ORDER — ACETAMINOPHEN 325 MG PO TABS
ORAL_TABLET | ORAL | Status: AC
  Filled 2015-03-20: qty 2

## 2015-03-20 MED ORDER — SODIUM CHLORIDE 0.9 % IV SOLN
420.0000 mg | Freq: Once | INTRAVENOUS | Status: AC
  Administered 2015-03-20: 420 mg via INTRAVENOUS
  Filled 2015-03-20: qty 14

## 2015-03-20 MED ORDER — ACETAMINOPHEN 325 MG PO TABS
650.0000 mg | ORAL_TABLET | Freq: Once | ORAL | Status: AC
  Administered 2015-03-20: 650 mg via ORAL

## 2015-03-20 MED ORDER — DIPHENHYDRAMINE HCL 25 MG PO CAPS
25.0000 mg | ORAL_CAPSULE | Freq: Once | ORAL | Status: AC
  Administered 2015-03-20: 25 mg via ORAL

## 2015-03-20 MED ORDER — SODIUM CHLORIDE 0.9 % IV SOLN
Freq: Once | INTRAVENOUS | Status: AC
  Administered 2015-03-20: 09:00:00 via INTRAVENOUS

## 2015-03-20 MED ORDER — DOCETAXEL CHEMO INJECTION 160 MG/16ML
75.0000 mg/m2 | Freq: Once | INTRAVENOUS | Status: AC
  Administered 2015-03-20: 130 mg via INTRAVENOUS
  Filled 2015-03-20: qty 13

## 2015-03-20 NOTE — Progress Notes (Signed)
[facility]  [phone] [phone]     ID: Rhonda Gardner DOB: 1956/04/28  MR#: [id number]  DGU#:[id number]  Patient Care Team: Rita Ohara, MD as PCP - General (Family Medicine) Erroll Luna, MD as Consulting Physician (General Surgery) Chauncey Cruel, MD as Consulting Physician (Oncology) Thea Silversmith, MD as Consulting Physician (Radiation Oncology) Mauro Kaufmann, RN as Registered Nurse Rockwell Germany, RN as Registered Nurse Holley Bouche, NP as Nurse Practitioner (Nurse Practitioner) PCP: Vikki Ports, MD OTHER MD:  CHIEF COMPLAINT: Triple negative breast cancer  CURRENT TREATMENT:  neoadjuvant chemotherapy and anti-HER-2 immunotherapy   BREAST CANCER HISTORY: From the original intake note:  "Rhonda Gardner" had not had a physical exam in quite some time, and had not had a mammogram since 2020, when she was evaluated by Dr. Tomi Bamberger 12/01/2014. Dr. Tomi Bamberger was able to palpate a 2 cm firm mass at the 3:00 position in the left breast. The patient herself has been aware of multiple breast masses and does have a history of fibrocystic change, with multiple prior benign biopsies. She did not feel this particular mass had changed recently.   Dr. Tomi Bamberger arranged for bilateral diagnostic mammography and left breast ultrasonography at the [facility] Center 12/15/2014. This shows the breast density to be category C. In the left breast upper outer quadrant there was a bilobed mass measuring approximately 2 cm. This was palpable. Ultrasound confirmed an irregular microlobulated and hypoechoic mass measuring 2.1 cm at the 2:00 location 6 cm from the nipple. There was no left axillary adenopathy.  Biopsy of the mass in question 12/19/2014 showed ([id number]) invasive ductal carcinoma, grade 2 or 3, estrogen and progesterone receptor negative, with an MIB-1 of 38%, and equivocal HER-2 amplification, the signals ratio being 1.57, the number per cell 4.25.  On  12/27/2014 the patient underwent bilateral breast MRI. The mass in question at the 2:00 location of the left breast measured 2.5 cm. It directly abuts the left pectoralis major. There is no enhancement in the muscle itself. There were no abnormal appearing lymph nodes. There was an incidentally noted 1 cm hepatic cyst at the dome of the right liver lobe.  Her subsequent history is as detailed below  INTERVAL HISTORY: Rhonda Gardner returns today for follow up of her breast cancer, accompanied by her husband [name]. Today is day 1, cycle 4 of 6 planned cycles of of carboplatin, docetaxel, trastuzumab and pertuzumab given every 3 weeks, with neulasta given on day 2 for granulocyte support.  REVIEW OF SYSTEMS: Rhonda Gardner has largely learned to manage the side effects and she is doing much better with that. She is taking Diflucan as well as valacyclovir and that is largely taking care of the sore throat problem she had the first time. Of course she also has Magic mouthwash available. She gets a "cold" meaning stuffiness for about 4 days after treatment. Likely this is due to the Taxotere. She controls that with Sudafed and Zyrtec. Her weight went down a little bit but now it up a little bit more so we are doing well there. Her diarrhea starts around day 5 and lasts about 7 days, but it comes and goes, and chest and Imodium here in there takes care of that problem she does have insomnia, but that's a long-standing problem. She has noted a teeny bit of ankle swelling on the left and that's described in the physical exam today and is very minimal. She does have peripheral neuropathy involving chiefly the feet  with a little bit of numbness around the tips of the toes and a feeling of the balls of her feet like she is walking on [place] lab. However that has completely resolved and she has had no peripheral neuropathy symptoms now for a week. Occasionally she has a little bit of blurred vision. She still gets muscle aches and  pains with Neulasta but they are much better. A detailed review of systems today was otherwise stable.   PAST MEDICAL HISTORY: Past Medical History  Diagnosis Date  . Allergy 2006    chemical cauterization in spring 2006, and 2nd treatment with good results.(Dr.Shoemaker)  . Bipolar disorder   . Breast cancer 12/2014    invasive ductal carcinoma (LEFT)  . Breast cancer of upper-outer quadrant of left female breast 12/22/2014    PAST SURGICAL HISTORY: Past Surgical History  Procedure Laterality Date  . Breast biopsy Bilateral     benign and a right stereotatic breast biopsy.  . Foot surgery Left 2014    Dr. Paulla Dolly  . Fibroid excision  age 41    prolapsed fibroid removed    FAMILY HISTORY Family History  Problem Relation Age of Onset  . Cancer Father     skin - non melanoma - farmer  . Heart disease Father   . Hypertension Mother   . Arthritis Mother     osteoarthritis  . Colon cancer Mother   . Macular degeneration Mother     wet and dry  . Cancer Mother 34    colon cancer at age 73  . Osteoporosis Sister     osteopenia  . Cancer Maternal Aunt     lung cancer  . Diabetes Paternal Aunt   . Cancer Paternal Aunt 59    ? stomach cancer  . Autism Sister   . Mental retardation Sister   . Blindness Sister     secondary to retrolental fibroplasia  . Goiter Paternal Aunt    the patient's father lived to be 95. He had a history of nonmelanoma skin cancers. [name] mother is living at age 74. She was diagnosed with colon cancer in her 64s. A paternal aunt may have had stomach cancer. Maternal aunt developed lung cancer in her 67s. The patient has one brother, 2 sisters. One of her sisters is blind and mentally retarded. There is no history of breast or ovarian cancer in the family.  GYNECOLOGIC HISTORY:  No LMP recorded. Patient is postmenopausal. Menarche age 61. The patient is GX P0. She stopped having periods in her early 47s. She did not take hormone replacement. She  took oral contraceptives remotely for over 5 years with no complications.  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 Her husband [name] worked in IT originally but now runs a family firm. It's just the 2 of them at home, plus a chocolate lab.    ADVANCED DIRECTIVES: In place   HEALTH MAINTENANCE: History  Substance Use Topics  . Smoking status: Never Smoker   . Smokeless tobacco: Never Used  . Alcohol Use: No     Colonoscopy: In [place] under Dr. Lavina Hamman, date not entirely clear  PAP: February 2016  Bone density: Under [facility]on, results not currently available  Lipid panel:  Allergies  Allergen Reactions  . Adhesive [Tape] Rash    Pt is sensitive to any kind of adhesive tape products.    Current Outpatient Prescriptions  Medication Sig Dispense Refill  . Alum & Mag Hydroxide-Simeth (MAGIC MOUTHWASH W/LIDOCAINE)  SOLN Take 5 mLs by mouth 4 (four) times daily as needed for mouth pain ([facility] mouthwash with nystatin and lidocaine.). (Patient not taking: Reported on 02/14/2015) 240 mL 0  . Cholecalciferol (VITAMIN D) 2000 UNITS tablet Take 2,000 Units by mouth daily.    . cholestyramine (QUESTRAN) 4 G packet Take 1 packet (4 g total) by mouth 3 (three) times daily with meals. (Patient not taking: Reported on 02/14/2015) 60 each 12  . Cyanocobalamin (VITAMIN B-12) 5000 MCG SUBL Place 5,000 mcg under the tongue daily.    Marland Kitchen dexamethasone (DECADRON) 4 MG tablet Take 2 tablets (8 mg total) by mouth 2 (two) times daily. Start the day before Taxotere. Then again the day after chemo for 3 days. (Patient not taking: Reported on 03/06/2015) 30 tablet 1  . Diphenhyd-Hydrocort-Nystatin (FIRST-DUKES MOUTHWASH) SUSP 5-10 ml QID prn swish swallow or spit 240 mL 3  . fluconazole (DIFLUCAN) 100 MG tablet Take 1 tablet (100 mg total) by mouth daily. 30 tablet 3  . LAMICTAL 200 MG tablet Take 0.5 tablets (100 mg total) by mouth daily. 90 tablet 0  . lidocaine-prilocaine (EMLA) cream  Apply to affected area once (Patient not taking: Reported on 03/06/2015) 30 g 3  . loperamide (IMODIUM) 2 MG capsule Take 1 capsule (2 mg total) by mouth as needed for diarrhea or loose stools. 30 capsule 12  . loratadine (CLARITIN) 10 MG tablet Take one tablet on the day of neulasta shot, and repeat the next 2 days 100 tablet 3  . LORazepam (ATIVAN) 0.5 MG tablet Take 1 tablet (0.5 mg total) by mouth at bedtime as needed (Nausea or vomiting). 30 tablet 0  . LUTEIN-ZEAXANTHIN PO Take 1 tablet by mouth daily.    . Multiple Vitamins-Minerals (PRESERVISION AREDS) CAPS Take 1 capsule by mouth 2 (two) times daily.    . naproxen sodium (ANAPROX) 220 MG tablet Take 220-440 mg by mouth daily as needed (pain). Aleve    . omeprazole (PRILOSEC) 40 MG capsule Take 1 capsule (40 mg total) by mouth daily. 30 capsule 2  . ondansetron (ZOFRAN) 8 MG tablet Take 1 tablet (8 mg total) by mouth 2 (two) times daily. Start the day after chemo for 3 days. Then take as needed for nausea or vomiting. (Patient not taking: Reported on 03/06/2015) 30 tablet 1  . prochlorperazine (COMPAZINE) 10 MG tablet Take 1 tablet (10 mg total) by mouth every 6 (six) hours as needed (Nausea or vomiting). (Patient not taking: Reported on 01/27/2015) 30 tablet 1  . sucralfate (CARAFATE) 1 GM/10ML suspension Take 10 mLs (1 g total) by mouth 4 (four) times daily as needed. (Patient not taking: Reported on 03/06/2015) 420 mL 0  . tobramycin-dexamethasone (TOBRADEX) ophthalmic solution PLACE 1 DROP INTO BOTH EYES TWICE DAILY. 5 mL 0  . valACYclovir (VALTREX) 1000 MG tablet Take 1 tablet (1,000 mg total) by mouth daily. 30 tablet 1   No current facility-administered medications for this visit.    OBJECTIVE: Middle-aged white woman who appears younger than stated age 46 Vitals:   03/20/15 0817  BP: 134/71  Pulse: 89  Temp: 98 F (36.7 C)  Resp: 18     Body mass index is 21.03 kg/(m^2).    ECOG FS:1 - Symptomatic but completely ambulatory    Sclerae unicteric, pupils round and equal Oropharynx clear and moist-- no thrush or other lesions No cervical or supraclavicular adenopathy Lungs no rales or rhonchi Heart regular rate and rhythm Abd soft, nontender, positive bowel sounds MSK no  focal spinal tenderness, no upper extremity lymphedema; I am not seeing even grade 1 ankle edema  Neuro: nonfocal, well oriented, appropriate affect Breasts: The right breast is unremarkable. I can no longer feel a mass in the left breast upper outer quadrant, which was easily palpable at the start of chemotherapy. There are no skin or nipple changes of concern. The left axilla is benign.   LAB RESULTS:  CMP     Component Value Date/Time   NA 138 03/06/2015 1354   NA 142 01/13/2015 0822   K 4.1 03/06/2015 1354   K 3.8 01/13/2015 0822   CL 106 01/13/2015 0822   CO2 27 03/06/2015 1354   CO2 26 01/13/2015 0822   GLUCOSE 99 03/06/2015 1354   GLUCOSE 83 01/13/2015 0822   BUN 18.1 03/06/2015 1354   BUN 8 01/13/2015 0822   CREATININE 0.9 03/06/2015 1354   CREATININE 0.77 01/13/2015 0822   CREATININE 0.80 12/01/2014 1437   CALCIUM 8.7 03/06/2015 1354   CALCIUM 9.1 01/13/2015 0822   PROT 6.3* 03/06/2015 1354   PROT 7.0 12/01/2014 1437   ALBUMIN 4.0 03/06/2015 1354   ALBUMIN 4.8 12/01/2014 1437   AST 27 03/06/2015 1354   AST 28 12/01/2014 1437   ALT 96* 03/06/2015 1354   ALT 24 12/01/2014 1437   ALKPHOS 94 03/06/2015 1354   ALKPHOS 65 12/01/2014 1437   BILITOT 0.39 03/06/2015 1354   BILITOT 0.6 12/01/2014 1437   GFRNONAA 89* 01/13/2015 0822   GFRAA >90 01/13/2015 0822    INo results found for: SPEP, UPEP  Lab Results  Component Value Date   WBC 8.0 03/20/2015   NEUTROABS 7.5* 03/20/2015   HGB 10.7* 03/20/2015   HCT 32.5* 03/20/2015   MCV 97.6 03/20/2015   PLT 192 03/20/2015      Chemistry      Component Value Date/Time   NA 138 03/06/2015 1354   NA 142 01/13/2015 0822   K 4.1 03/06/2015 1354   K 3.8 01/13/2015 0822    CL 106 01/13/2015 0822   CO2 27 03/06/2015 1354   CO2 26 01/13/2015 0822   BUN 18.1 03/06/2015 1354   BUN 8 01/13/2015 0822   CREATININE 0.9 03/06/2015 1354   CREATININE 0.77 01/13/2015 0822   CREATININE 0.80 12/01/2014 1437      Component Value Date/Time   CALCIUM 8.7 03/06/2015 1354   CALCIUM 9.1 01/13/2015 0822   ALKPHOS 94 03/06/2015 1354   ALKPHOS 65 12/01/2014 1437   AST 27 03/06/2015 1354   AST 28 12/01/2014 1437   ALT 96* 03/06/2015 1354   ALT 24 12/01/2014 1437   BILITOT 0.39 03/06/2015 1354   BILITOT 0.6 12/01/2014 1437       No results found for: LABCA2  No components found for: LABCA125  No results for input(s): INR in the last 168 hours.  Urinalysis    Component Value Date/Time   LABSPEC 1.010 08/09/2007 2121   PHURINE 7.5 08/09/2007 2121   GLUCOSEU NEGATIVE 08/09/2007 2121   HGBUR SMALL* 08/09/2007 2121   BILIRUBINUR neg 12/01/2014 [place] 08/09/2007 2121   KETONESUR NEGATIVE 08/09/2007 2121   PROTEINUR neg 12/01/2014 [place] 08/09/2007 2121   UROBILINOGEN negative 12/01/2014 1358   UROBILINOGEN 0.2 08/09/2007 2121   NITRITE neg 12/01/2014 1358   NITRITE NEGATIVE 08/09/2007 2121   LEUKOCYTESUR Negative 12/01/2014 1358    STUDIES: No results found.  ASSESSMENT: 59 y.o. [place] woman s/p left breast upper outer quadrant biopsy  12/19/2014 for a clinical T2 N0, stage IIA invasive ductal carcinoma, grade 2 or 3, estrogen and progesterone receptor negative, with an MIB-1 of 38%, HER-2 equivocal initially but positive on further testing with a ratio of 3.4  (1) neoadjuvant chemotherapy to consist of carboplatin, docetaxel, trastuzumab and pertuzumab given every 3 weeks for 6 cycles with Neulasta support--started 01/16/2015  (2) definitive surgery to follow chemotherapy  (3) radiation to follow surgery  (4) genetics counselor felt the risk of a familial mutation was sufficiently low testing could be  omitted  PLAN: Rhonda Gardner is having a good response to her treatment. The tumor is no longer palpable after only 3 cycles. Sometimes we obtain a mid course MRI, just to make sure were moving in the right direction, but in this case I don't think we will need to do that.  We will certainly obtain an MRI at the end of the 6 treatments. At this point Rhonda Gardner is thinking of lumpectomy rather than mastectomy (the main reason she wanted to consider a mastectomy was to avoid radiation)  He 1 concern I have is the fact that she was having significant neuropathy symptoms. They have completely resolved as of several days ago so we can certainly proceed today. However I would like to drop in on her appointment in 3 weeks from now, before cycle 5, just to underscore that issue. If there is any question we will switch to Gemzar instead of Taxotere for the final cycles.  Rhonda Gardner has a good understanding of the overall plan. She agrees with it. She will call with any problems that may develop before next visit here.  Chauncey Cruel, MD   03/20/2015 8:36 AM

## 2015-03-20 NOTE — Patient Instructions (Signed)
Learned Discharge Instructions for Patients Receiving Chemotherapy  Today you received the following chemotherapy agents Herceptin/Perjeta/Taxotere/Carboplatin.  To help prevent nausea and vomiting after your treatment, we encourage you to take your nausea medication as directed.   If you develop nausea and vomiting that is not controlled by your nausea medication, call the clinic.   BELOW ARE SYMPTOMS THAT SHOULD BE REPORTED IMMEDIATELY:  *FEVER GREATER THAN 100.5 F  *CHILLS WITH OR WITHOUT FEVER  NAUSEA AND VOMITING THAT IS NOT CONTROLLED WITH YOUR NAUSEA MEDICATION  *UNUSUAL SHORTNESS OF BREATH  *UNUSUAL BRUISING OR BLEEDING  TENDERNESS IN MOUTH AND THROAT WITH OR WITHOUT PRESENCE OF ULCERS  *URINARY PROBLEMS  *BOWEL PROBLEMS  UNUSUAL RASH Items with * indicate a potential emergency and should be followed up as soon as possible.  Feel free to call the clinic you have any questions or concerns. The clinic phone number is [phone].  Please show the [place] at check-in to the Emergency Department and triage nurse.

## 2015-03-27 ENCOUNTER — Encounter: Payer: Self-pay | Admitting: Nurse Practitioner

## 2015-03-27 ENCOUNTER — Ambulatory Visit (HOSPITAL_BASED_OUTPATIENT_CLINIC_OR_DEPARTMENT_OTHER): Payer: BLUE CROSS/BLUE SHIELD | Admitting: Nurse Practitioner

## 2015-03-27 ENCOUNTER — Other Ambulatory Visit (HOSPITAL_BASED_OUTPATIENT_CLINIC_OR_DEPARTMENT_OTHER): Payer: BLUE CROSS/BLUE SHIELD

## 2015-03-27 VITALS — BP 106/62 | HR 98 | Temp 98.1°F | Resp 18 | Wt 133.2 lb

## 2015-03-27 DIAGNOSIS — C50412 Malignant neoplasm of upper-outer quadrant of left female breast: Secondary | ICD-10-CM

## 2015-03-27 DIAGNOSIS — G620 Drug-induced polyneuropathy: Secondary | ICD-10-CM

## 2015-03-27 DIAGNOSIS — T451X5A Adverse effect of antineoplastic and immunosuppressive drugs, initial encounter: Secondary | ICD-10-CM

## 2015-03-27 LAB — COMPREHENSIVE METABOLIC PANEL (CC13)
ALBUMIN: 3.9 g/dL (ref 3.5–5.0)
ALK PHOS: 95 U/L (ref 40–150)
ALT: 63 U/L — ABNORMAL HIGH (ref 0–55)
ANION GAP: 9 meq/L (ref 3–11)
AST: 19 U/L (ref 5–34)
BUN: 11.7 mg/dL (ref 7.0–26.0)
CO2: 26 meq/L (ref 22–29)
Calcium: 8.9 mg/dL (ref 8.4–10.4)
Chloride: 101 mEq/L (ref 98–109)
Creatinine: 0.8 mg/dL (ref 0.6–1.1)
EGFR: 77 mL/min/{1.73_m2} — AB (ref >90)
Glucose: 100 mg/dl (ref 70–140)
Potassium: 4.2 mEq/L (ref 3.5–5.1)
Sodium: 136 mEq/L (ref 136–145)
Total Bilirubin: 0.35 mg/dL (ref 0.20–1.20)
Total Protein: 6.2 g/dL — ABNORMAL LOW (ref 6.4–8.3)

## 2015-03-27 LAB — CBC WITH DIFFERENTIAL/PLATELET
BASO%: 0.3 % (ref 0.0–2.0)
Basophils Absolute: 0 10*3/uL (ref 0.0–0.1)
EOS ABS: 0 10*3/uL (ref 0.0–0.5)
EOS%: 0 % (ref 0.0–7.0)
HCT: 31.3 % — ABNORMAL LOW (ref 34.8–46.6)
HEMOGLOBIN: 10.4 g/dL — AB (ref 11.6–15.9)
LYMPH%: 20.5 % (ref 14.0–49.7)
MCH: 32.2 pg (ref 25.1–34.0)
MCHC: 33.2 g/dL (ref 31.5–36.0)
MCV: 96.9 fL (ref 79.5–101.0)
MONO#: 1 10*3/uL — ABNORMAL HIGH (ref 0.1–0.9)
MONO%: 14.8 % — AB (ref 0.0–14.0)
NEUT%: 64.4 % (ref 38.4–76.8)
NEUTROS ABS: 4.3 10*3/uL (ref 1.5–6.5)
PLATELETS: 159 10*3/uL (ref 145–400)
RBC: 3.23 10*6/uL — ABNORMAL LOW (ref 3.70–5.45)
RDW: 17.1 % — ABNORMAL HIGH (ref 11.2–14.5)
WBC: 6.7 10*3/uL (ref 3.9–10.3)
lymph#: 1.4 10*3/uL (ref 0.9–3.3)

## 2015-03-27 NOTE — Addendum Note (Signed)
Addended by: Rea [employer] D on: 03/27/2015 04:01 PM   Modules accepted: Medications

## 2015-03-27 NOTE — Progress Notes (Signed)
[place]  [phone] [phone]     ID: Rhonda Gardner DOB: 09-22-1956  MR#: [id number]  HWY#:[id number]  Patient Care Team: Rita Ohara, MD as PCP - General (Family Medicine) Erroll Luna, MD as Consulting Physician (General Surgery) Chauncey Cruel, MD as Consulting Physician (Oncology) Thea Silversmith, MD as Consulting Physician (Radiation Oncology) Mauro Kaufmann, RN as Registered Nurse Rockwell Germany, RN as Registered Nurse Holley Bouche, NP as Nurse Practitioner (Nurse Practitioner) PCP: Vikki Ports, MD OTHER MD:  CHIEF COMPLAINT: Triple negative breast cancer  CURRENT TREATMENT:  neoadjuvant chemotherapy and anti-HER-2 immunotherapy   BREAST CANCER HISTORY: From the original intake note:  "Rhonda Gardner" had not had a physical exam in quite some time, and had not had a mammogram since 2020, when she was evaluated by Dr. Tomi Bamberger 12/01/2014. Dr. Tomi Bamberger was able to palpate a 2 cm firm mass at the 3:00 position in the left breast. The patient herself has been aware of multiple breast masses and does have a history of fibrocystic change, with multiple prior benign biopsies. She did not feel this particular mass had changed recently.   Dr. Tomi Bamberger arranged for bilateral diagnostic mammography and left breast ultrasonography at the breast Center 12/15/2014. This shows the breast density to be category C. In the left breast upper outer quadrant there was a bilobed mass measuring approximately 2 cm. This was palpable. Ultrasound confirmed an irregular microlobulated and hypoechoic mass measuring 2.1 cm at the 2:00 location 6 cm from the nipple. There was no left axillary adenopathy.  Biopsy of the mass in question 12/19/2014 showed ([id number]) invasive ductal carcinoma, grade 2 or 3, estrogen and progesterone receptor negative, with an MIB-1 of 38%, and equivocal HER-2 amplification, the signals ratio being 1.57, the number per cell 4.25.  On  12/27/2014 the patient underwent bilateral breast MRI. The mass in question at the 2:00 location of the left breast measured 2.5 cm. It directly abuts the left pectoralis major. There is no enhancement in the muscle itself. There were no abnormal appearing lymph nodes. There was an incidentally noted 1 cm hepatic cyst at the dome of the right liver lobe.  Her subsequent history is as detailed below  INTERVAL HISTORY: Rhonda Gardner returns today for follow up of her breast cancer, accompanied by her husband [name]. Today is day 8, cycle 4 of 6 planned cycles of of carboplatin, docetaxel, trastuzumab and pertuzumab given every 3 weeks, with neulasta given on day 2 for granulocyte support.  REVIEW OF SYSTEMS: Rhonda Gardner denies fevers, chills, nausea, vomiting, or changes in bowel or bladder habits. She believes she is eating well, but she has lost 13lb since the start of chemotherapy. Her husband says is is eating very light, and mostly vegetables. Her vision is blurred from the steroids. Her tongue is tender at times, but no visible mouth sores. 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 She feels like she is standing on rough burlap or foam. A detailed review of systems is otherwise stable.  PAST MEDICAL HISTORY: Past Medical History  Diagnosis Date  . Allergy 2006    chemical cauterization in spring 2006, and 2nd treatment with good results.(Dr.Shoemaker)  . Bipolar disorder   . Breast cancer 12/2014    invasive ductal carcinoma (LEFT)  . Breast cancer of upper-outer quadrant of left female breast 12/22/2014    PAST SURGICAL HISTORY: Past Surgical History  Procedure Laterality Date  . Breast biopsy Bilateral  benign and a right stereotatic breast biopsy.  . Foot surgery Left 2014    Dr. Paulla Dolly  . Fibroid excision  age 86    prolapsed fibroid removed    FAMILY HISTORY Family History  Problem Relation Age of Onset  . Cancer Father     skin - non melanoma - farmer  . Heart  disease Father   . Hypertension Mother   . Arthritis Mother     osteoarthritis  . Colon cancer Mother   . Macular degeneration Mother     wet and dry  . Cancer Mother 83    colon cancer at age 71  . Osteoporosis Sister     osteopenia  . Cancer Maternal Aunt     lung cancer  . Diabetes Paternal Aunt   . Cancer Paternal Aunt 39    ? stomach cancer  . Autism Sister   . Mental retardation Sister   . Blindness Sister     secondary to retrolental fibroplasia  . Goiter Paternal Aunt    the patient's father lived to be 95. He had a history of nonmelanoma skin cancers. [name] mother is living at age 54. She was diagnosed with colon cancer in her 69s. A paternal aunt may have had stomach cancer. Maternal aunt developed lung cancer in her 42s. The patient has one brother, 2 sisters. One of her sisters is blind and mentally retarded. There is no history of breast or ovarian cancer in the family.  GYNECOLOGIC HISTORY:  No LMP recorded. Patient is postmenopausal. Menarche age 73. The patient is GX P0. She stopped having periods in her early 71s. She did not take hormone replacement. She took oral contraceptives remotely for over 5 years with no complications.  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 Her husband [name] worked in IT originally but now runs a family firm. It's just the 2 of them at home, plus a chocolate lab.    ADVANCED DIRECTIVES: In place   HEALTH MAINTENANCE: History  Substance Use Topics  . Smoking status: Never Smoker   . Smokeless tobacco: Never Used  . Alcohol Use: No     Colonoscopy: In [place] under Dr. Lavina Hamman, date not entirely clear  PAP: February 2016  Bone density: Under S[facility] Radon, results not currently available  Lipid panel:  Allergies  Allergen Reactions  . Adhesive [Tape] Rash    Pt is sensitive to any kind of adhesive tape products.    Current Outpatient Prescriptions  Medication Sig Dispense Refill  .  Cholecalciferol (VITAMIN D) 2000 UNITS tablet Take 2,000 Units by mouth daily.    . Cyanocobalamin (VITAMIN B-12) 5000 MCG SUBL Place 5,000 mcg under the tongue daily.    Marland Kitchen dexamethasone (DECADRON) 4 MG tablet Take 2 tablets (8 mg total) by mouth 2 (two) times daily. Start the day before Taxotere. Then again the day after chemo for 3 days. 30 tablet 1  . fluconazole (DIFLUCAN) 100 MG tablet Take 1 tablet (100 mg total) by mouth daily. 30 tablet 3  . LAMICTAL 200 MG tablet Take 0.5 tablets (100 mg total) by mouth daily. 90 tablet 0  . lidocaine-prilocaine (EMLA) cream Apply to affected area once 30 g 3  . loperamide (IMODIUM) 2 MG capsule Take 1 capsule (2 mg total) by mouth as needed for diarrhea or loose stools. 30 capsule 12  . loratadine (CLARITIN) 10 MG tablet Take one tablet on the day of neulasta shot, and repeat the next 2 days  100 tablet 3  . LORazepam (ATIVAN) 0.5 MG tablet Take 1 tablet (0.5 mg total) by mouth at bedtime as needed (Nausea or vomiting). 30 tablet 0  . LUTEIN-ZEAXANTHIN PO Take 1 tablet by mouth daily.    . Multiple Vitamins-Minerals (PRESERVISION AREDS) CAPS Take 1 capsule by mouth 2 (two) times daily.    . naproxen sodium (ANAPROX) 220 MG tablet Take 220-440 mg by mouth daily as needed (pain). Aleve    . omeprazole (PRILOSEC) 40 MG capsule Take 1 capsule (40 mg total) by mouth daily. 30 capsule 2  . tobramycin-dexamethasone (TOBRADEX) ophthalmic solution PLACE 1 DROP INTO BOTH EYES TWICE DAILY. 5 mL 0  . valACYclovir (VALTREX) 1000 MG tablet Take 1 tablet (1,000 mg total) by mouth daily. 30 tablet 1  . Alum & Mag Hydroxide-Simeth (MAGIC MOUTHWASH W/LIDOCAINE) SOLN Take 5 mLs by mouth 4 (four) times daily as needed for mouth pain ([facility] mouthwash with nystatin and lidocaine.). (Patient not taking: Reported on 02/14/2015) 240 mL 0  . cholestyramine (QUESTRAN) 4 G packet Take 1 packet (4 g total) by mouth 3 (three) times daily with meals. (Patient not taking: Reported on  02/14/2015) 60 each 12  . Diphenhyd-Hydrocort-Nystatin (FIRST-DUKES MOUTHWASH) SUSP 5-10 ml QID prn swish swallow or spit (Patient not taking: Reported on 03/27/2015) 240 mL 3  . ondansetron (ZOFRAN) 8 MG tablet Take 1 tablet (8 mg total) by mouth 2 (two) times daily. Start the day after chemo for 3 days. Then take as needed for nausea or vomiting. (Patient not taking: Reported on 03/06/2015) 30 tablet 1  . prochlorperazine (COMPAZINE) 10 MG tablet Take 1 tablet (10 mg total) by mouth every 6 (six) hours as needed (Nausea or vomiting). (Patient not taking: Reported on 01/27/2015) 30 tablet 1  . sucralfate (CARAFATE) 1 GM/10ML suspension Take 10 mLs (1 g total) by mouth 4 (four) times daily as needed. (Patient not taking: Reported on 03/06/2015) 420 mL 0   No current facility-administered medications for this visit.    OBJECTIVE: Middle-aged white woman who appears younger than stated age 48 Vitals:   03/27/15 1107  BP: 106/62  Pulse: 98  Temp: 98.1 F (36.7 C)  Resp: 18     Body mass index is 20.26 kg/(m^2).    ECOG FS:1 - Symptomatic but completely ambulatory   'Skin: warm, dry  HEENT: sclerae anicteric, conjunctivae pink, oropharynx clear. No thrush or mucositis.  Lymph Nodes: No cervical or supraclavicular lymphadenopathy  Lungs: clear to auscultation bilaterally, no rales, wheezes, or rhonci  Heart: regular rate and rhythm  Abdomen: round, soft, non tender, positive bowel sounds  Musculoskeletal: No focal spinal tenderness, no peripheral edema  Neuro: non focal, well oriented, positive affect  Breasts: deferred   LAB RESULTS:  CMP     Component Value Date/Time   NA 136 03/27/2015 1039   NA 142 01/13/2015 0822   K 4.2 03/27/2015 1039   K 3.8 01/13/2015 0822   CL 106 01/13/2015 0822   CO2 26 03/27/2015 1039   CO2 26 01/13/2015 0822   GLUCOSE 100 03/27/2015 1039   GLUCOSE 83 01/13/2015 0822   BUN 11.7 03/27/2015 1039   BUN 8 01/13/2015 0822   CREATININE 0.8 03/27/2015 1039    CREATININE 0.77 01/13/2015 0822   CREATININE 0.80 12/01/2014 1437   CALCIUM 8.9 03/27/2015 1039   CALCIUM 9.1 01/13/2015 0822   PROT 6.2* 03/27/2015 1039   PROT 7.0 12/01/2014 1437   ALBUMIN 3.9 03/27/2015 1039   ALBUMIN 4.8 12/01/2014  1437   AST 19 03/27/2015 1039   AST 28 12/01/2014 1437   ALT 63* 03/27/2015 1039   ALT 24 12/01/2014 1437   ALKPHOS 95 03/27/2015 1039   ALKPHOS 65 12/01/2014 1437   BILITOT 0.35 03/27/2015 1039   BILITOT 0.6 12/01/2014 1437   GFRNONAA 89* 01/13/2015 0822   GFRAA >90 01/13/2015 0822    INo results found for: SPEP, UPEP  Lab Results  Component Value Date   WBC 6.7 03/27/2015   NEUTROABS 4.3 03/27/2015   HGB 10.4* 03/27/2015   HCT 31.3* 03/27/2015   MCV 96.9 03/27/2015   PLT 159 03/27/2015      Chemistry      Component Value Date/Time   NA 136 03/27/2015 1039   NA 142 01/13/2015 0822   K 4.2 03/27/2015 1039   K 3.8 01/13/2015 0822   CL 106 01/13/2015 0822   CO2 26 03/27/2015 1039   CO2 26 01/13/2015 0822   BUN 11.7 03/27/2015 1039   BUN 8 01/13/2015 0822   CREATININE 0.8 03/27/2015 1039   CREATININE 0.77 01/13/2015 0822   CREATININE 0.80 12/01/2014 1437      Component Value Date/Time   CALCIUM 8.9 03/27/2015 1039   CALCIUM 9.1 01/13/2015 0822   ALKPHOS 95 03/27/2015 1039   ALKPHOS 65 12/01/2014 1437   AST 19 03/27/2015 1039   AST 28 12/01/2014 1437   ALT 63* 03/27/2015 1039   ALT 24 12/01/2014 1437   BILITOT 0.35 03/27/2015 1039   BILITOT 0.6 12/01/2014 1437       No results found for: LABCA2  No components found for: LABCA125  No results for input(s): INR in the last 168 hours.  Urinalysis    Component Value Date/Time   LABSPEC 1.010 08/09/2007 2121   PHURINE 7.5 08/09/2007 2121   GLUCOSEU NEGATIVE 08/09/2007 2121   HGBUR SMALL* 08/09/2007 2121   BILIRUBINUR neg 12/01/2014 [place] 08/09/2007 2121   KETONESUR NEGATIVE 08/09/2007 2121   PROTEINUR neg 12/01/2014 [place]  08/09/2007 2121   UROBILINOGEN negative 12/01/2014 1358   UROBILINOGEN 0.2 08/09/2007 2121   NITRITE neg 12/01/2014 1358   NITRITE NEGATIVE 08/09/2007 2121   LEUKOCYTESUR Negative 12/01/2014 1358    STUDIES: No results found.  ASSESSMENT: 59 y.o. [place] woman s/p left breast upper outer quadrant biopsy 12/19/2014 for a clinical T2 N0, stage IIA invasive ductal carcinoma, grade 2 or 3, estrogen and progesterone receptor negative, with an MIB-1 of 38%, HER-2 equivocal initially but positive on further testing with a ratio of 3.4  (1) neoadjuvant chemotherapy to consist of carboplatin, docetaxel, trastuzumab and pertuzumab given every 3 weeks for 6 cycles with Neulasta support--started 01/16/2015  (2) definitive surgery to follow chemotherapy  (3) radiation to follow surgery  (4) genetics counselor felt the risk of a familial mutation was sufficiently low testing could be omitted  PLAN: Rhonda Gardner is doing well today. The labs were reviewed in detail and were entirely stable. Unfortunately, her neuropathy has returned. I will inform Dr. Jana Hakim, as he suggested in his last note a change may be needed to the taxotere, by switching it out for gemcitabine.   I have encouraged Rhonda Gardner to incorporate more protein and calories in her diet. She has lost a fair amount of weight since the start of chemo.   Rhonda Gardner will return in 2 weeks for the start of cycle 5 of treatment. I have placed orders for a repeat echocardiogram to be performed at the end  of this month. She understands and agrees with this plan. She knows the goal of treatment in her case is cure. She has been encouraged to call with any issues that might arise before her next visit here.   Laurie Panda, NP   03/27/2015 12:04 PM

## 2015-03-28 ENCOUNTER — Other Ambulatory Visit: Payer: Self-pay | Admitting: Nurse Practitioner

## 2015-04-03 ENCOUNTER — Ambulatory Visit (HOSPITAL_COMMUNITY): Payer: BLUE CROSS/BLUE SHIELD | Attending: Nurse Practitioner

## 2015-04-03 ENCOUNTER — Other Ambulatory Visit: Payer: Self-pay | Admitting: Nurse Practitioner

## 2015-04-03 ENCOUNTER — Telehealth: Payer: Self-pay | Admitting: Nurse Practitioner

## 2015-04-03 ENCOUNTER — Other Ambulatory Visit: Payer: Self-pay

## 2015-04-03 ENCOUNTER — Other Ambulatory Visit: Payer: Self-pay | Admitting: Oncology

## 2015-04-03 ENCOUNTER — Ambulatory Visit (HOSPITAL_BASED_OUTPATIENT_CLINIC_OR_DEPARTMENT_OTHER): Payer: BLUE CROSS/BLUE SHIELD

## 2015-04-03 ENCOUNTER — Telehealth: Payer: Self-pay

## 2015-04-03 DIAGNOSIS — C50412 Malignant neoplasm of upper-outer quadrant of left female breast: Secondary | ICD-10-CM

## 2015-04-03 DIAGNOSIS — R319 Hematuria, unspecified: Principal | ICD-10-CM

## 2015-04-03 DIAGNOSIS — R300 Dysuria: Secondary | ICD-10-CM

## 2015-04-03 DIAGNOSIS — N390 Urinary tract infection, site not specified: Secondary | ICD-10-CM

## 2015-04-03 LAB — URINALYSIS, MICROSCOPIC - CHCC
BILIRUBIN (URINE): NEGATIVE
Glucose: NEGATIVE mg/dL
KETONES: NEGATIVE mg/dL
NITRITE: NEGATIVE
Protein: NEGATIVE mg/dL
Specific Gravity, Urine: 1.005 (ref 1.003–1.035)
Urobilinogen, UR: 0.2 mg/dL (ref 0.2–1)
pH: 7.5 (ref 4.6–8.0)

## 2015-04-03 MED ORDER — CIPROFLOXACIN HCL 500 MG PO TABS: 500.0000 mg | ORAL_TABLET | Freq: Two times a day (BID) | ORAL | Status: DC

## 2015-04-03 NOTE — Telephone Encounter (Signed)
Patient came in as an order was in but no pof came through for patient to have labs today, lab added   anne

## 2015-04-03 NOTE — Telephone Encounter (Signed)
Patient called the cancer Center earlier today with complaints of dysuria and urgency of urination.  She denied any other new symptoms whatsoever.  She denied any recent fevers or chills.  Urinalysis obtained today revealed trace blood, nitrate negative, leukocyte esterase small, WBC 0-2.    Advised patient would prescribe Cipro anabiotic's for treatment of mild UTI symptoms.  Also advised patient to call/return to go directly to the emergency department for any worsening symptoms whatsoever.  Patient's understanding of all instructions; and was in agreement with this plan of care.

## 2015-04-03 NOTE — Telephone Encounter (Signed)
Pt states she has sx like a UTI but not passing blood.

## 2015-04-03 NOTE — Telephone Encounter (Signed)
Pt has sx of UTI: pain when urinate, urgency comes and goes, no blood present. No fever, no flank pain, no back pain, no GI sx. Per Selena Lesser will get UA/culture.

## 2015-04-04 LAB — URINE CULTURE

## 2015-04-10 ENCOUNTER — Ambulatory Visit (HOSPITAL_BASED_OUTPATIENT_CLINIC_OR_DEPARTMENT_OTHER): Payer: BLUE CROSS/BLUE SHIELD

## 2015-04-10 ENCOUNTER — Encounter: Payer: Self-pay | Admitting: Nurse Practitioner

## 2015-04-10 ENCOUNTER — Other Ambulatory Visit: Payer: Self-pay | Admitting: Oncology

## 2015-04-10 ENCOUNTER — Telehealth: Payer: Self-pay | Admitting: *Deleted

## 2015-04-10 ENCOUNTER — Ambulatory Visit (HOSPITAL_BASED_OUTPATIENT_CLINIC_OR_DEPARTMENT_OTHER): Payer: BLUE CROSS/BLUE SHIELD | Admitting: Nurse Practitioner

## 2015-04-10 ENCOUNTER — Other Ambulatory Visit (HOSPITAL_BASED_OUTPATIENT_CLINIC_OR_DEPARTMENT_OTHER): Payer: BLUE CROSS/BLUE SHIELD

## 2015-04-10 ENCOUNTER — Other Ambulatory Visit: Payer: Self-pay | Admitting: *Deleted

## 2015-04-10 VITALS — BP 123/65 | HR 90 | Temp 97.7°F | Resp 18 | Ht 68.0 in | Wt 140.8 lb

## 2015-04-10 DIAGNOSIS — Z171 Estrogen receptor negative status [ER-]: Secondary | ICD-10-CM | POA: Diagnosis not present

## 2015-04-10 DIAGNOSIS — Z5112 Encounter for antineoplastic immunotherapy: Secondary | ICD-10-CM | POA: Diagnosis not present

## 2015-04-10 DIAGNOSIS — C50412 Malignant neoplasm of upper-outer quadrant of left female breast: Secondary | ICD-10-CM

## 2015-04-10 DIAGNOSIS — Z5189 Encounter for other specified aftercare: Secondary | ICD-10-CM

## 2015-04-10 DIAGNOSIS — Z5111 Encounter for antineoplastic chemotherapy: Secondary | ICD-10-CM | POA: Diagnosis not present

## 2015-04-10 DIAGNOSIS — T451X5A Adverse effect of antineoplastic and immunosuppressive drugs, initial encounter: Secondary | ICD-10-CM

## 2015-04-10 DIAGNOSIS — M7989 Other specified soft tissue disorders: Secondary | ICD-10-CM

## 2015-04-10 DIAGNOSIS — G620 Drug-induced polyneuropathy: Secondary | ICD-10-CM | POA: Diagnosis not present

## 2015-04-10 LAB — COMPREHENSIVE METABOLIC PANEL (CC13)
ALT: 83 U/L — ABNORMAL HIGH (ref 0–55)
AST: 54 U/L — ABNORMAL HIGH (ref 5–34)
Albumin: 3.9 g/dL (ref 3.5–5.0)
Alkaline Phosphatase: 88 U/L (ref 40–150)
Anion Gap: 10 mEq/L (ref 3–11)
BUN: 20.3 mg/dL (ref 7.0–26.0)
CO2: 23 meq/L (ref 22–29)
CREATININE: 0.8 mg/dL (ref 0.6–1.1)
Calcium: 9.3 mg/dL (ref 8.4–10.4)
Chloride: 109 mEq/L (ref 98–109)
EGFR: 77 mL/min/{1.73_m2} — ABNORMAL LOW (ref >90)
GLUCOSE: 105 mg/dL (ref 70–140)
Potassium: 3.8 mEq/L (ref 3.5–5.1)
SODIUM: 142 meq/L (ref 136–145)
Total Bilirubin: 0.43 mg/dL (ref 0.20–1.20)
Total Protein: 6.3 g/dL — ABNORMAL LOW (ref 6.4–8.3)

## 2015-04-10 LAB — CBC WITH DIFFERENTIAL/PLATELET
BASO%: 0 % (ref 0.0–2.0)
BASOS ABS: 0 10*3/uL (ref 0.0–0.1)
EOS%: 0 % (ref 0.0–7.0)
Eosinophils Absolute: 0 10*3/uL (ref 0.0–0.5)
HEMATOCRIT: 28.7 % — AB (ref 34.8–46.6)
HEMOGLOBIN: 9.4 g/dL — AB (ref 11.6–15.9)
LYMPH#: 0.4 10*3/uL — AB (ref 0.9–3.3)
LYMPH%: 4.7 % — ABNORMAL LOW (ref 14.0–49.7)
MCH: 32.6 pg (ref 25.1–34.0)
MCHC: 32.8 g/dL (ref 31.5–36.0)
MCV: 99.7 fL (ref 79.5–101.0)
MONO#: 0.5 10*3/uL (ref 0.1–0.9)
MONO%: 5.8 % (ref 0.0–14.0)
NEUT#: 7.7 10*3/uL — ABNORMAL HIGH (ref 1.5–6.5)
NEUT%: 89.5 % — AB (ref 38.4–76.8)
Platelets: 210 10*3/uL (ref 145–400)
RBC: 2.88 10*6/uL — ABNORMAL LOW (ref 3.70–5.45)
RDW: 18.6 % — ABNORMAL HIGH (ref 11.2–14.5)
WBC: 8.6 10*3/uL (ref 3.9–10.3)

## 2015-04-10 MED ORDER — SODIUM CHLORIDE 0.9 % IJ SOLN
10.0000 mL | INTRAMUSCULAR | Status: DC | PRN
  Administered 2015-04-10: 10 mL
  Filled 2015-04-10: qty 10

## 2015-04-10 MED ORDER — DIPHENHYDRAMINE HCL 25 MG PO CAPS
ORAL_CAPSULE | ORAL | Status: AC
  Filled 2015-04-10: qty 1

## 2015-04-10 MED ORDER — DIPHENHYDRAMINE HCL 25 MG PO CAPS
25.0000 mg | ORAL_CAPSULE | Freq: Once | ORAL | Status: AC
  Administered 2015-04-10: 25 mg via ORAL

## 2015-04-10 MED ORDER — ACETAMINOPHEN 325 MG PO TABS
650.0000 mg | ORAL_TABLET | Freq: Once | ORAL | Status: AC
  Administered 2015-04-10: 650 mg via ORAL

## 2015-04-10 MED ORDER — SODIUM CHLORIDE 0.9 % IV SOLN
Freq: Once | INTRAVENOUS | Status: AC
  Administered 2015-04-10: 11:00:00 via INTRAVENOUS
  Filled 2015-04-10: qty 8

## 2015-04-10 MED ORDER — SODIUM CHLORIDE 0.9 % IV SOLN
510.0000 mg | Freq: Once | INTRAVENOUS | Status: AC
  Administered 2015-04-10: 510 mg via INTRAVENOUS
  Filled 2015-04-10: qty 51

## 2015-04-10 MED ORDER — HEPARIN SOD (PORK) LOCK FLUSH 100 UNIT/ML IV SOLN
500.0000 [IU] | Freq: Once | INTRAVENOUS | Status: AC | PRN
  Administered 2015-04-10: 500 [IU]
  Filled 2015-04-10: qty 5

## 2015-04-10 MED ORDER — SODIUM CHLORIDE 0.9 % IV SOLN
Freq: Once | INTRAVENOUS | Status: AC
  Administered 2015-04-10: 10:00:00 via INTRAVENOUS

## 2015-04-10 MED ORDER — TRASTUZUMAB CHEMO INJECTION 440 MG
6.0000 mg/kg | Freq: Once | INTRAVENOUS | Status: AC
  Administered 2015-04-10: 399 mg via INTRAVENOUS
  Filled 2015-04-10: qty 19

## 2015-04-10 MED ORDER — GEMCITABINE HCL CHEMO INJECTION 1 GM/26.3ML
800.0000 mg/m2 | Freq: Once | INTRAVENOUS | Status: AC
  Administered 2015-04-10: 1406 mg via INTRAVENOUS
  Filled 2015-04-10: qty 36.98

## 2015-04-10 MED ORDER — SODIUM CHLORIDE 0.9 % IV SOLN
420.0000 mg | Freq: Once | INTRAVENOUS | Status: AC
  Administered 2015-04-10: 420 mg via INTRAVENOUS
  Filled 2015-04-10: qty 14

## 2015-04-10 MED ORDER — PEGFILGRASTIM 6 MG/0.6ML ~~LOC~~ PSKT
6.0000 mg | PREFILLED_SYRINGE | Freq: Once | SUBCUTANEOUS | Status: AC
  Administered 2015-04-10: 6 mg via SUBCUTANEOUS
  Filled 2015-04-10: qty 0.6

## 2015-04-10 MED ORDER — ACETAMINOPHEN 325 MG PO TABS
ORAL_TABLET | ORAL | Status: AC
Start: 2015-04-10 — End: 2015-04-10
  Filled 2015-04-10: qty 2

## 2015-04-10 NOTE — Telephone Encounter (Signed)
This RN returned call to pt per her message-   Pt states she has noticed swelling in her left foot when she returned home post chemo infusion this am.  " not swollen terrible but still larger then the other one- this happened once before after my chemo but wanted to make sure it is not something I need to let you know about "  Cassie denies any redness or pain.  She states " when I put my finger on it it springs back, slowly " ( she stated " I don't know why you do that but my neighbor who is a nurse dose that ")   Per further discussion she states no known recent injury " but I believe this is the shin I had a pretty bad accident on over 30 years ago"  [place] is elevating leg when able.  Plan is pt understands to continue to elevate as much as possible.   Discussed probable need to obtain a doppler study for evaluation.  She understands to call the on call doctor if symptoms worsen.  MD made aware of above and requested for doppler to be obtained in the morning.

## 2015-04-10 NOTE — Progress Notes (Signed)
[place]  [phone] [phone]     ID: Rhonda Gardner DOB: [birth year]  MR#: [id number]  VZD#:[id number]  Patient Care Team: Rita Ohara, MD as PCP - General (Family Medicine) Erroll Luna, MD as Consulting Physician (General Surgery) Chauncey Cruel, MD as Consulting Physician (Oncology) Thea Silversmith, MD as Consulting Physician (Radiation Oncology) Mauro Kaufmann, RN as Registered Nurse Rockwell Germany, RN as Registered Nurse Holley Bouche, NP as Nurse Practitioner (Nurse Practitioner) PCP: Vikki Ports, MD OTHER MD:  CHIEF COMPLAINT: Triple negative breast cancer  CURRENT TREATMENT:  neoadjuvant chemotherapy and anti-HER-2 immunotherapy   BREAST CANCER HISTORY: From the original intake note:  "Rhonda Gardner" had not had a physical exam in quite some time, and had not had a mammogram since 2020, when she was evaluated by Dr. Tomi Bamberger 12/01/2014. Dr. Tomi Bamberger was able to palpate a 2 cm firm mass at the 3:00 position in the left breast. The patient herself has been aware of multiple breast masses and does have a history of fibrocystic change, with multiple prior benign biopsies. She did not feel this particular mass had changed recently.   Dr. Tomi Bamberger arranged for bilateral diagnostic mammography and left breast ultrasonography at the breast Center 12/15/2014. This shows the breast density to be category C. In the left breast upper outer quadrant there was a bilobed mass measuring approximately 2 cm. This was palpable. Ultrasound confirmed an irregular microlobulated and hypoechoic mass measuring 2.1 cm at the 2:00 location 6 cm from the nipple. There was no left axillary adenopathy.  Biopsy of the mass in question 12/19/2014 showed ([id number]) invasive ductal carcinoma, grade 2 or 3, estrogen and progesterone receptor negative, with an MIB-1 of 38%, and equivocal HER-2 amplification, the signals ratio being 1.57, the number per cell 4.25.  On  12/27/2014 the patient underwent bilateral breast MRI. The mass in question at the 2:00 location of the left breast measured 2.5 cm. It directly abuts the left pectoralis major. There is no enhancement in the muscle itself. There were no abnormal appearing lymph nodes. There was an incidentally noted 1 cm hepatic cyst at the dome of the right liver lobe.  Her subsequent history is as detailed below  INTERVAL HISTORY: Rhonda Gardner returns today for follow up of her breast cancer, accompanied by her husband, [name]. Today is day 1, cycle 5 of 6 planned cycles of of carboplatin, docetaxel, trastuzumab and pertuzumab given every 3 weeks, with neulasta given on day 2 for granulocyte support.  REVIEW OF SYSTEMS: Rhonda Gardner's numbness has completely resolved in her fingertips, and has improved in her toes but is still present. She denies fevers, chills, nausea, or vomiting. Occasionally she will have a loose stool. She is eating well and has gained some of her weight back. She denies mouth sores or rashes, but she does have some light bruising to her forearms. She stopped taking lorazepam and melatonin but is still sleeping well. A detailed review of systems is otherwise stable.  PAST MEDICAL HISTORY: Past Medical History  Diagnosis Date  . Allergy 2006    chemical cauterization in spring 2006, and 2nd treatment with good results.(Dr.Shoemaker)  . Bipolar disorder   . Breast cancer 12/2014    invasive ductal carcinoma (LEFT)  . Breast cancer of upper-outer quadrant of left female breast 12/22/2014    PAST SURGICAL HISTORY: Past Surgical History  Procedure Laterality Date  . Breast biopsy Bilateral     benign and a right stereotatic breast biopsy.  Marland Kitchen  Foot surgery Left 2014    Dr. Paulla Dolly  . Fibroid excision  age 78    prolapsed fibroid removed    FAMILY HISTORY Family History  Problem Relation Age of Onset  . Cancer Father     skin - non melanoma - farmer  . Heart disease Father   . Hypertension Mother     . Arthritis Mother     osteoarthritis  . Colon cancer Mother   . Macular degeneration Mother     wet and dry  . Cancer Mother 62    colon cancer at age 2  . Osteoporosis Sister     osteopenia  . Cancer Maternal Aunt     lung cancer  . Diabetes Paternal Aunt   . Cancer Paternal Aunt 54    ? stomach cancer  . Autism Sister   . Mental retardation Sister   . Blindness Sister     secondary to retrolental fibroplasia  . Goiter Paternal Aunt    the patient's father lived to be 95. He had a history of nonmelanoma skin cancers. [name] mother is living at age 36. She was diagnosed with colon cancer in her 40s. A paternal aunt may have had stomach cancer. Maternal aunt developed lung cancer in her 26s. The patient has one brother, 2 sisters. One of her sisters is blind and mentally retarded. There is no history of breast or ovarian cancer in the family.  GYNECOLOGIC HISTORY:  No LMP recorded. Patient is postmenopausal. Menarche age 41. The patient is GX P0. She stopped having periods in her early 57s. She did not take hormone replacement. She took oral contraceptives remotely for over 5 years with no complications.  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 Her husband [name] worked in IT originally but now runs a family firm. It's just the 2 of them at home, plus a chocolate lab.    ADVANCED DIRECTIVES: In place   HEALTH MAINTENANCE: History  Substance Use Topics  . Smoking status: Never Smoker   . Smokeless tobacco: Never Used  . Alcohol Use: No     Colonoscopy: In [place] under Dr. Lavina Hamman, date not entirely clear  PAP: February 2016  Bone density: Under S[facility] Radon, results not currently available  Lipid panel:  Allergies  Allergen Reactions  . Adhesive [Tape] Rash    Pt is sensitive to any kind of adhesive tape products.    Current Outpatient Prescriptions  Medication Sig Dispense Refill  . Cholecalciferol (VITAMIN D) 2000 UNITS tablet Take  2,000 Units by mouth daily.    . Cyanocobalamin (VITAMIN B-12) 5000 MCG SUBL Place 5,000 mcg under the tongue daily.    Marland Kitchen dexamethasone (DECADRON) 4 MG tablet TAKE 2 TABLETS BY MOUTH TWICE DAILY. START THE DAY BEFORE TAXOTERE, THEN AGAIN THE DAY AFTER CHEMO FOR 3 DAYS 30 tablet 0  . fluconazole (DIFLUCAN) 100 MG tablet Take 1 tablet (100 mg total) by mouth daily. 30 tablet 3  . LAMICTAL 200 MG tablet Take 0.5 tablets (100 mg total) by mouth daily. 90 tablet 0  . lidocaine-prilocaine (EMLA) cream Apply to affected area once 30 g 3  . loperamide (IMODIUM) 2 MG capsule Take 1 capsule (2 mg total) by mouth as needed for diarrhea or loose stools. 30 capsule 12  . loratadine (CLARITIN) 10 MG tablet Take one tablet on the day of neulasta shot, and repeat the next 2 days 100 tablet 3  . LUTEIN-ZEAXANTHIN PO Take 1 tablet by mouth daily.    Marland Kitchen  Misc Natural Products (TART CHERRY ADVANCED) CAPS Take 1 capsule by mouth daily.    . Multiple Vitamins-Minerals (PRESERVISION AREDS) CAPS Take 1 capsule by mouth 2 (two) times daily.    . naproxen sodium (ANAPROX) 220 MG tablet Take 220-440 mg by mouth daily as needed (pain). Aleve    . omeprazole (PRILOSEC) 40 MG capsule Take 1 capsule (40 mg total) by mouth daily. 30 capsule 2  . tobramycin-dexamethasone (TOBRADEX) ophthalmic solution PLACE 1 DROP INTO BOTH EYES TWICE DAILY. 5 mL 0  . UNABLE TO FIND Take 30 mLs by mouth daily. Tart Cherry Juice.    . valACYclovir (VALTREX) 1000 MG tablet TAKE 1 TABLET BY MOUTH DAILY 30 tablet 0  . Alum & Mag Hydroxide-Simeth (MAGIC MOUTHWASH W/LIDOCAINE) SOLN Take 5 mLs by mouth 4 (four) times daily as needed for mouth pain ([facility] mouthwash with nystatin and lidocaine.). (Patient not taking: Reported on 02/14/2015) 240 mL 0  . cholestyramine (QUESTRAN) 4 G packet Take 1 packet (4 g total) by mouth 3 (three) times daily with meals. (Patient not taking: Reported on 02/14/2015) 60 each 12  . ciprofloxacin (CIPRO) 500 MG tablet Take 1  tablet (500 mg total) by mouth 2 (two) times daily. (Patient not taking: Reported on 04/10/2015) 10 tablet 0  . Diphenhyd-Hydrocort-Nystatin (FIRST-DUKES MOUTHWASH) SUSP 5-10 ml QID prn swish swallow or spit (Patient not taking: Reported on 03/27/2015) 240 mL 3  . LORazepam (ATIVAN) 0.5 MG tablet Take 1 tablet (0.5 mg total) by mouth at bedtime as needed (Nausea or vomiting). (Patient not taking: Reported on 04/10/2015) 30 tablet 0  . ondansetron (ZOFRAN) 8 MG tablet Take 1 tablet (8 mg total) by mouth 2 (two) times daily. Start the day after chemo for 3 days. Then take as needed for nausea or vomiting. (Patient not taking: Reported on 03/06/2015) 30 tablet 1  . prochlorperazine (COMPAZINE) 10 MG tablet Take 1 tablet (10 mg total) by mouth every 6 (six) hours as needed (Nausea or vomiting). (Patient not taking: Reported on 01/27/2015) 30 tablet 1  . sucralfate (CARAFATE) 1 GM/10ML suspension Take 10 mLs (1 g total) by mouth 4 (four) times daily as needed. (Patient not taking: Reported on 03/06/2015) 420 mL 0   No current facility-administered medications for this visit.    OBJECTIVE: Middle-aged white woman who appears younger than stated age 59 Vitals:   04/10/15 0828  BP: 123/65  Pulse: 90  Temp: 97.7 F (36.5 C)  Resp: 18     Body mass index is 21.41 kg/(m^2).    ECOG FS:1 - Symptomatic but completely ambulatory   Sclerae unicteric, pupils round and equal Oropharynx clear and moist-- no thrush or other lesions No cervical or supraclavicular adenopathy Lungs no rales or rhonchi Heart regular rate and rhythm Abd soft, nontender, positive bowel sounds MSK no focal spinal tenderness, no upper extremity lymphedema Neuro: nonfocal, well oriented, appropriate affect Breasts: deferred   LAB RESULTS:  CMP     Component Value Date/Time   NA 142 04/10/2015 0817   NA 142 01/13/2015 0822   K 3.8 04/10/2015 0817   K 3.8 01/13/2015 0822   CL 106 01/13/2015 0822   CO2 23 04/10/2015 0817    CO2 26 01/13/2015 0822   GLUCOSE 105 04/10/2015 0817   GLUCOSE 83 01/13/2015 0822   BUN 20.3 04/10/2015 0817   BUN 8 01/13/2015 0822   CREATININE 0.8 04/10/2015 0817   CREATININE 0.77 01/13/2015 0822   CREATININE 0.80 12/01/2014 1437   CALCIUM 9.3  04/10/2015 0817   CALCIUM 9.1 01/13/2015 0822   PROT 6.3* 04/10/2015 0817   PROT 7.0 12/01/2014 1437   ALBUMIN 3.9 04/10/2015 0817   ALBUMIN 4.8 12/01/2014 1437   AST 54* 04/10/2015 0817   AST 28 12/01/2014 1437   ALT 83* 04/10/2015 0817   ALT 24 12/01/2014 1437   ALKPHOS 88 04/10/2015 0817   ALKPHOS 65 12/01/2014 1437   BILITOT 0.43 04/10/2015 0817   BILITOT 0.6 12/01/2014 1437   GFRNONAA 89* 01/13/2015 0822   GFRAA >90 01/13/2015 0822    INo results found for: SPEP, UPEP  Lab Results  Component Value Date   WBC 8.6 04/10/2015   NEUTROABS 7.7* 04/10/2015   HGB 9.4* 04/10/2015   HCT 28.7* 04/10/2015   MCV 99.7 04/10/2015   PLT 210 04/10/2015      Chemistry      Component Value Date/Time   NA 142 04/10/2015 0817   NA 142 01/13/2015 0822   K 3.8 04/10/2015 0817   K 3.8 01/13/2015 0822   CL 106 01/13/2015 0822   CO2 23 04/10/2015 0817   CO2 26 01/13/2015 0822   BUN 20.3 04/10/2015 0817   BUN 8 01/13/2015 0822   CREATININE 0.8 04/10/2015 0817   CREATININE 0.77 01/13/2015 0822   CREATININE 0.80 12/01/2014 1437      Component Value Date/Time   CALCIUM 9.3 04/10/2015 0817   CALCIUM 9.1 01/13/2015 0822   ALKPHOS 88 04/10/2015 0817   ALKPHOS 65 12/01/2014 1437   AST 54* 04/10/2015 0817   AST 28 12/01/2014 1437   ALT 83* 04/10/2015 0817   ALT 24 12/01/2014 1437   BILITOT 0.43 04/10/2015 0817   BILITOT 0.6 12/01/2014 1437       No results found for: LABCA2  No components found for: YYQMG500  No results for input(s): INR in the last 168 hours.  Urinalysis    Component Value Date/Time   LABSPEC 1.005 04/03/2015 1451   LABSPEC 1.010 08/09/2007 2121   PHURINE 7.5 04/03/2015 1451   PHURINE 7.5 08/09/2007 2121    GLUCOSEU Negative 04/03/2015 1451   GLUCOSEU NEGATIVE 08/09/2007 2121   HGBUR Trace 04/03/2015 1451   HGBUR SMALL* 08/09/2007 2121   BILIRUBINUR Negative 04/03/2015 1451   BILIRUBINUR neg 12/01/2014 [place] 08/09/2007 2121   KETONESUR Negative 04/03/2015 [place] 08/09/2007 2121   PROTEINUR Negative 04/03/2015 1451   PROTEINUR neg 12/01/2014 [place] 08/09/2007 2121   UROBILINOGEN 0.2 04/03/2015 1451   UROBILINOGEN negative 12/01/2014 1358   UROBILINOGEN 0.2 08/09/2007 2121   NITRITE Negative 04/03/2015 1451   NITRITE neg 12/01/2014 1358   NITRITE NEGATIVE 08/09/2007 2121   LEUKOCYTESUR Small 04/03/2015 1451   LEUKOCYTESUR Negative 12/01/2014 1358    STUDIES: No results found.  ASSESSMENT: 59 y.o. [place] woman s/p left breast upper outer quadrant biopsy 12/19/2014 for a clinical T2 N0, stage IIA invasive ductal carcinoma, grade 2 or 3, estrogen and progesterone receptor negative, with an MIB-1 of 38%, HER-2 equivocal initially but positive on further testing with a ratio of 3.4  (1) neoadjuvant chemotherapy to consist of carboplatin, docetaxel, trastuzumab and pertuzumab given every 3 weeks for 6 cycles with Neulasta support--started 01/16/2015  (a) docetaxel switched for gemcitabine for final 2 cycles because of persistent neuropathy.  (2) definitive surgery to follow chemotherapy  (3) radiation to follow surgery  (4) genetics counselor felt the risk of a familial mutation was sufficiently low testing could be omitted  PLAN: I discussed Rhonda Gardner's persistent neuropathy with Dr. Jana Hakim. He suggests switching the docetaxel out for gemcitabine for cycles 5 and 6. This plan had been previously discussed with the patient and she is in agreement. She will proceed with cycle 5 of treatment as planned today.  She had an echocardiogram performed last week that showed a well preserved ejection fraction.   Rhonda Gardner will return in  1 week for labs and a nadir visit. She understands and agrees with this plan. She knows the goal of treatment in her case is cure. She has been encouraged to call with any issues that might arise before her next visit here.   Laurie Panda, NP   04/10/2015 9:04 AM

## 2015-04-10 NOTE — Patient Instructions (Signed)
[facility] Discharge Instructions for Patients Receiving Chemotherapy  Today you received the following chemotherapy agents Herceptin/Perjeta/Gemzar/Carboplatin To help prevent nausea and vomiting after your treatment, we encourage you to take your nausea medication     If you develop nausea and vomiting that is not controlled by your nausea medication, call the clinic.   BELOW ARE SYMPTOMS THAT SHOULD BE REPORTED IMMEDIATELY:  *FEVER GREATER THAN 100.5 F  *CHILLS WITH OR WITHOUT FEVER  NAUSEA AND VOMITING THAT IS NOT CONTROLLED WITH YOUR NAUSEA MEDICATION  *UNUSUAL SHORTNESS OF BREATH  *UNUSUAL BRUISING OR BLEEDING  TENDERNESS IN MOUTH AND THROAT WITH OR WITHOUT PRESENCE OF ULCERS  *URINARY PROBLEMS  *BOWEL PROBLEMS  UNUSUAL RASH Items with * indicate a potential emergency and should be followed up as soon as possible.  Feel free to call the clinic you have any questions or concerns. The clinic phone number is [phone].  Please show the [place] at check-in to the Emergency Department and triage nurse.

## 2015-04-11 ENCOUNTER — Ambulatory Visit (HOSPITAL_COMMUNITY): Admission: RE | Admit: 2015-04-11 | Payer: BLUE CROSS/BLUE SHIELD | Source: Ambulatory Visit

## 2015-04-11 ENCOUNTER — Ambulatory Visit (HOSPITAL_COMMUNITY)
Admission: RE | Admit: 2015-04-11 | Discharge: 2015-04-11 | Disposition: A | Discharge status: Home / Self Care | Payer: BLUE CROSS/BLUE SHIELD | Source: Ambulatory Visit | Attending: Oncology | Admitting: Oncology

## 2015-04-11 ENCOUNTER — Telehealth: Payer: Self-pay | Admitting: *Deleted

## 2015-04-11 ENCOUNTER — Other Ambulatory Visit: Payer: Self-pay | Admitting: *Deleted

## 2015-04-11 DIAGNOSIS — M7989 Other specified soft tissue disorders: Secondary | ICD-10-CM | POA: Diagnosis not present

## 2015-04-11 DIAGNOSIS — C50412 Malignant neoplasm of upper-outer quadrant of left female breast: Secondary | ICD-10-CM

## 2015-04-11 NOTE — Telephone Encounter (Signed)
TC from patient this am. She states that both feet are now swollen to ankle.and she is noticing some facial swelling as well.  Reinforced with patient about keeping her feet/legs elevated higher than her heart. Will make Dr. Jana Hakim and Tivis Ringer, RN aware.   Denies leg pain, redness. Only swelling.

## 2015-04-11 NOTE — Progress Notes (Signed)
VASCULAR LAB PRELIMINARY  PRELIMINARY  PRELIMINARY  PRELIMINARY  Left lower extremity venous duplex completed.    Preliminary report:  Left:  No evidence of DVT, superficial thrombosis, or Baker's cyst.  Fronie Holstein, RVT 04/11/2015, 1:43 PM

## 2015-04-12 ENCOUNTER — Other Ambulatory Visit: Payer: Self-pay | Admitting: Oncology

## 2015-04-12 NOTE — Progress Notes (Unsigned)
Called pt w good results of dopplers

## 2015-04-18 ENCOUNTER — Telehealth: Payer: Self-pay | Admitting: Oncology

## 2015-04-18 ENCOUNTER — Encounter: Payer: Self-pay | Admitting: Nurse Practitioner

## 2015-04-18 ENCOUNTER — Other Ambulatory Visit (HOSPITAL_BASED_OUTPATIENT_CLINIC_OR_DEPARTMENT_OTHER): Payer: BLUE CROSS/BLUE SHIELD

## 2015-04-18 ENCOUNTER — Ambulatory Visit (HOSPITAL_BASED_OUTPATIENT_CLINIC_OR_DEPARTMENT_OTHER): Payer: BLUE CROSS/BLUE SHIELD | Admitting: Nurse Practitioner

## 2015-04-18 VITALS — BP 113/72 | HR 90 | Temp 98.6°F | Resp 18 | Ht 68.0 in | Wt 134.6 lb

## 2015-04-18 DIAGNOSIS — C50412 Malignant neoplasm of upper-outer quadrant of left female breast: Secondary | ICD-10-CM | POA: Diagnosis not present

## 2015-04-18 DIAGNOSIS — D6481 Anemia due to antineoplastic chemotherapy: Secondary | ICD-10-CM

## 2015-04-18 DIAGNOSIS — T451X5A Adverse effect of antineoplastic and immunosuppressive drugs, initial encounter: Secondary | ICD-10-CM

## 2015-04-18 DIAGNOSIS — D6959 Other secondary thrombocytopenia: Secondary | ICD-10-CM

## 2015-04-18 LAB — CBC WITH DIFFERENTIAL/PLATELET
BASO%: 0.1 % (ref 0.0–2.0)
Basophils Absolute: 0 10*3/uL (ref 0.0–0.1)
EOS%: 0.1 % (ref 0.0–7.0)
Eosinophils Absolute: 0 10*3/uL (ref 0.0–0.5)
HEMATOCRIT: 26.9 % — AB (ref 34.8–46.6)
HGB: 8.8 g/dL — ABNORMAL LOW (ref 11.6–15.9)
LYMPH#: 1.1 10*3/uL (ref 0.9–3.3)
LYMPH%: 7.9 % — ABNORMAL LOW (ref 14.0–49.7)
MCH: 33.1 pg (ref 25.1–34.0)
MCHC: 32.7 g/dL (ref 31.5–36.0)
MCV: 101.1 fL — ABNORMAL HIGH (ref 79.5–101.0)
MONO#: 0.7 10*3/uL (ref 0.1–0.9)
MONO%: 4.9 % (ref 0.0–14.0)
NEUT#: 12.3 10*3/uL — ABNORMAL HIGH (ref 1.5–6.5)
NEUT%: 87 % — ABNORMAL HIGH (ref 38.4–76.8)
Platelets: 95 10*3/uL — ABNORMAL LOW (ref 145–400)
RBC: 2.66 10*6/uL — ABNORMAL LOW (ref 3.70–5.45)
RDW: 17.5 % — AB (ref 11.2–14.5)
WBC: 14.2 10*3/uL — AB (ref 3.9–10.3)
nRBC: 0 % (ref 0–0)

## 2015-04-18 LAB — COMPREHENSIVE METABOLIC PANEL (CC13)
ALK PHOS: 140 U/L (ref 40–150)
ALT: 57 U/L — AB (ref 0–55)
ANION GAP: 8 meq/L (ref 3–11)
AST: 25 U/L (ref 5–34)
Albumin: 3.9 g/dL (ref 3.5–5.0)
BILIRUBIN TOTAL: 0.32 mg/dL (ref 0.20–1.20)
BUN: 18.5 mg/dL (ref 7.0–26.0)
CO2: 25 meq/L (ref 22–29)
CREATININE: 0.8 mg/dL (ref 0.6–1.1)
Calcium: 9.1 mg/dL (ref 8.4–10.4)
Chloride: 105 mEq/L (ref 98–109)
EGFR: 81 mL/min/{1.73_m2} — ABNORMAL LOW (ref >90)
Glucose: 100 mg/dl (ref 70–140)
Potassium: 3.8 mEq/L (ref 3.5–5.1)
Sodium: 137 mEq/L (ref 136–145)
Total Protein: 6 g/dL — ABNORMAL LOW (ref 6.4–8.3)

## 2015-04-18 NOTE — Patient Instructions (Signed)

## 2015-04-18 NOTE — Telephone Encounter (Signed)
pof mri order noted and patient will get a call from central scheduling

## 2015-04-18 NOTE — Progress Notes (Signed)
[place]  [phone]) [phone] Fax:(336) [phone]     ID: Geneveive Furness Cox-Fishman DOB: 1956/06/21  MR#: [id number]  OEV#:[id number]  Patient Care Team: Rita Ohara, MD as PCP - General (Family Medicine) Erroll Luna, MD as Consulting Physician (General Surgery) Chauncey Cruel, MD as Consulting Physician (Oncology) Thea Silversmith, MD as Consulting Physician (Radiation Oncology) Mauro Kaufmann, RN as Registered Nurse Rockwell Germany, RN as Registered Nurse Holley Bouche, NP as Nurse Practitioner (Nurse Practitioner) PCP: Vikki Ports, MD OTHER MD:  CHIEF COMPLAINT: Triple negative breast cancer  CURRENT TREATMENT:  neoadjuvant chemotherapy and anti-HER-2 immunotherapy   BREAST CANCER HISTORY: From the original intake note:  "Rhonda Gardner" had not had a physical exam in quite some time, and had not had a mammogram since 2020, when she was evaluated by Dr. Tomi Bamberger 12/01/2014. Dr. Tomi Bamberger was able to palpate a 2 cm firm mass at the 3:00 position in the left breast. The patient herself has been aware of multiple breast masses and does have a history of fibrocystic change, with multiple prior benign biopsies. She did not feel this particular mass had changed recently.   Dr. Tomi Bamberger arranged for bilateral diagnostic mammography and left breast ultrasonography at the breast Center 12/15/2014. This shows the breast density to be category C. In the left breast upper outer quadrant there was a bilobed mass measuring approximately 2 cm. This was palpable. Ultrasound confirmed an irregular microlobulated and hypoechoic mass measuring 2.1 cm at the 2:00 location 6 cm from the nipple. There was no left axillary adenopathy.  Biopsy of the mass in question 12/19/2014 showed ([id number]) invasive ductal carcinoma, grade 2 or 3, estrogen and progesterone receptor negative, with an MIB-1 of 38%, and equivocal HER-2 amplification, the signals ratio being 1.57, the number per cell 4.25.  On  12/27/2014 the patient underwent bilateral breast MRI. The mass in question at the 2:00 location of the left breast measured 2.5 cm. It directly abuts the left pectoralis major. There is no enhancement in the muscle itself. There were no abnormal appearing lymph nodes. There was an incidentally noted 1 cm hepatic cyst at the dome of the right liver lobe.  Her subsequent history is as detailed below  INTERVAL HISTORY: Rhonda Gardner returns today for follow up of her breast cancer, accompanied by her husband, [name]. Today is day 8, cycle 5 of 6 planned cycles of of gemcitabine, carboplatin, trastuzumab and pertuzumab given every 3 weeks, with neulasta given on day 2 for granulocyte support. Gemcitabine was substituted for docetaxel during this cycle because of progressive neuropathy.  REVIEW OF SYSTEMS: Rhonda Gardner only had 2 bad days this cycle. She had diarrhea once that resolved with a dose of imodium. She denies nausea. Her vision is more blurred lately, which we are attributing to the steroid use. She is eating and drinking well. She denies mouth sores or rashes. She still has some tinging to her toes. She had bilateral leg swelling, that resolved with elevation. A doppler performed was negative for a clot. A detailed review of systems is otherwise stable.  PAST MEDICAL HISTORY: Past Medical History  Diagnosis Date  . Allergy 2006    chemical cauterization in spring 2006, and 2nd treatment with good results.(Dr.Shoemaker)  . Bipolar disorder   . Breast cancer 12/2014    invasive ductal carcinoma (LEFT)  . Breast cancer of upper-outer quadrant of left female breast 12/22/2014    PAST SURGICAL HISTORY: Past Surgical History  Procedure Laterality Date  . Breast  biopsy Bilateral     benign and a right stereotatic breast biopsy.  . Foot surgery Left 2014    Dr. Paulla Dolly  . Fibroid excision  age 59    prolapsed fibroid removed    FAMILY HISTORY Family History  Problem Relation Age of Onset  . Cancer  Father     skin - non melanoma - farmer  . Heart disease Father   . Hypertension Mother   . Arthritis Mother     osteoarthritis  . Colon cancer Mother   . Macular degeneration Mother     wet and dry  . Cancer Mother 72    colon cancer at age 28  . Osteoporosis Sister     osteopenia  . Cancer Maternal Aunt     lung cancer  . Diabetes Paternal Aunt   . Cancer Paternal Aunt 14    ? stomach cancer  . Autism Sister   . Mental retardation Sister   . Blindness Sister     secondary to retrolental fibroplasia  . Goiter Paternal Aunt    the patient's father lived to be 95. He had a history of nonmelanoma skin cancers. [name] mother is living at age 72. She was diagnosed with colon cancer in her 70s. A paternal aunt may have had stomach cancer. Maternal aunt developed lung cancer in her 97s. The patient has one brother, 2 sisters. One of her sisters is blind and mentally retarded. There is no history of breast or ovarian cancer in the family.  GYNECOLOGIC HISTORY:  No LMP recorded. Patient is postmenopausal. Menarche age 56. The patient is GX P0. She stopped having periods in her early 21s. She did not take hormone replacement. She took oral contraceptives remotely for over 5 years with no complications.  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 Her husband [name] worked in IT originally but now runs a family firm. It's just the 2 of them at home, plus a chocolate lab.    ADVANCED DIRECTIVES: In place   HEALTH MAINTENANCE: History  Substance Use Topics  . Smoking status: Never Smoker   . Smokeless tobacco: Never Used  . Alcohol Use: No     Colonoscopy: In [place] under Dr. Lavina Hamman, date not entirely clear  PAP: February 2016  Bone density: Under S[facility] Radon, results not currently available  Lipid panel:  Allergies  Allergen Reactions  . Adhesive [Tape] Rash    Pt is sensitive to any kind of adhesive tape products.    Current Outpatient  Prescriptions  Medication Sig Dispense Refill  . Cholecalciferol (VITAMIN D) 2000 UNITS tablet Take 2,000 Units by mouth daily.    . Cyanocobalamin (VITAMIN B-12) 5000 MCG SUBL Place 5,000 mcg under the tongue daily.    . Diphenhyd-Hydrocort-Nystatin (FIRST-DUKES MOUTHWASH) SUSP 5-10 ml QID prn swish swallow or spit 240 mL 3  . fluconazole (DIFLUCAN) 100 MG tablet Take 1 tablet (100 mg total) by mouth daily. 30 tablet 3  . LAMICTAL 200 MG tablet Take 0.5 tablets (100 mg total) by mouth daily. 90 tablet 0  . loperamide (IMODIUM) 2 MG capsule Take 1 capsule (2 mg total) by mouth as needed for diarrhea or loose stools. 30 capsule 12  . LUTEIN-ZEAXANTHIN PO Take 1 tablet by mouth daily.    . Misc Natural Products (TART CHERRY ADVANCED) CAPS Take 1 capsule by mouth daily.    . Multiple Vitamins-Minerals (PRESERVISION AREDS) CAPS Take 1 capsule by mouth 2 (two) times daily.    Marland Kitchen  naproxen sodium (ANAPROX) 220 MG tablet Take 220-440 mg by mouth daily as needed (pain). Aleve    . omeprazole (PRILOSEC) 40 MG capsule Take 1 capsule (40 mg total) by mouth daily. 30 capsule 2  . tobramycin-dexamethasone (TOBRADEX) ophthalmic solution PLACE 1 DROP INTO BOTH EYES TWICE DAILY. 5 mL 0  . UNABLE TO FIND Take 30 mLs by mouth daily. Tart Cherry Juice.    . valACYclovir (VALTREX) 1000 MG tablet TAKE 1 TABLET BY MOUTH DAILY 30 tablet 0  . Alum & Mag Hydroxide-Simeth (MAGIC MOUTHWASH W/LIDOCAINE) SOLN Take 5 mLs by mouth 4 (four) times daily as needed for mouth pain ([facility] mouthwash with nystatin and lidocaine.). (Patient not taking: Reported on 02/14/2015) 240 mL 0  . cholestyramine (QUESTRAN) 4 G packet Take 1 packet (4 g total) by mouth 3 (three) times daily with meals. (Patient not taking: Reported on 02/14/2015) 60 each 12  . dexamethasone (DECADRON) 4 MG tablet TAKE 2 TABLETS BY MOUTH TWICE DAILY. START THE DAY BEFORE TAXOTERE, THEN AGAIN THE DAY AFTER CHEMO FOR 3 DAYS (Patient not taking: Reported on 04/18/2015) 30  tablet 0  . lidocaine-prilocaine (EMLA) cream Apply to affected area once (Patient not taking: Reported on 04/18/2015) 30 g 3  . loratadine (CLARITIN) 10 MG tablet Take one tablet on the day of neulasta shot, and repeat the next 2 days 100 tablet 3  . LORazepam (ATIVAN) 0.5 MG tablet Take 1 tablet (0.5 mg total) by mouth at bedtime as needed (Nausea or vomiting). (Patient not taking: Reported on 04/18/2015) 30 tablet 0  . ondansetron (ZOFRAN) 8 MG tablet Take 1 tablet (8 mg total) by mouth 2 (two) times daily. Start the day after chemo for 3 days. Then take as needed for nausea or vomiting. (Patient not taking: Reported on 03/06/2015) 30 tablet 1  . prochlorperazine (COMPAZINE) 10 MG tablet Take 1 tablet (10 mg total) by mouth every 6 (six) hours as needed (Nausea or vomiting). (Patient not taking: Reported on 01/27/2015) 30 tablet 1  . sucralfate (CARAFATE) 1 GM/10ML suspension Take 10 mLs (1 g total) by mouth 4 (four) times daily as needed. (Patient not taking: Reported on 03/06/2015) 420 mL 0   No current facility-administered medications for this visit.    OBJECTIVE: Middle-aged white woman who appears younger than stated age 13 Vitals:   04/18/15 0836  BP: 113/72  Pulse: 90  Temp: 98.6 F (37 C)  Resp: 18     Body mass index is 20.47 kg/(m^2).    ECOG FS:1 - Symptomatic but completely ambulatory   Skin: warm, dry  HEENT: sclerae anicteric, conjunctivae pink, oropharynx clear. No thrush or mucositis.  Lymph Nodes: No cervical or supraclavicular lymphadenopathy  Lungs: clear to auscultation bilaterally, no rales, wheezes, or rhonci  Heart: regular rate and rhythm  Abdomen: round, soft, non tender, positive bowel sounds  Musculoskeletal: No focal spinal tenderness, no peripheral edema  Neuro: non focal, well oriented, positive affect  Breasts: deferred   LAB RESULTS:  CMP     Component Value Date/Time   NA 137 04/18/2015 0809   NA 142 01/13/2015 0822   K 3.8 04/18/2015 0809   K  3.8 01/13/2015 0822   CL 106 01/13/2015 0822   CO2 25 04/18/2015 0809   CO2 26 01/13/2015 0822   GLUCOSE 100 04/18/2015 0809   GLUCOSE 83 01/13/2015 0822   BUN 18.5 04/18/2015 0809   BUN 8 01/13/2015 0822   CREATININE 0.8 04/18/2015 0809   CREATININE 0.77 01/13/2015  5053   CREATININE 0.80 12/01/2014 1437   CALCIUM 9.1 04/18/2015 0809   CALCIUM 9.1 01/13/2015 0822   PROT 6.0* 04/18/2015 0809   PROT 7.0 12/01/2014 1437   ALBUMIN 3.9 04/18/2015 0809   ALBUMIN 4.8 12/01/2014 1437   AST 25 04/18/2015 0809   AST 28 12/01/2014 1437   ALT 57* 04/18/2015 0809   ALT 24 12/01/2014 1437   ALKPHOS 140 04/18/2015 0809   ALKPHOS 65 12/01/2014 1437   BILITOT 0.32 04/18/2015 0809   BILITOT 0.6 12/01/2014 1437   GFRNONAA 89* 01/13/2015 0822   GFRAA >90 01/13/2015 0822    INo results found for: SPEP, UPEP  Lab Results  Component Value Date   WBC 14.2* 04/18/2015   NEUTROABS 12.3* 04/18/2015   HGB 8.8* 04/18/2015   HCT 26.9* 04/18/2015   MCV 101.1* 04/18/2015   PLT 95* 04/18/2015      Chemistry      Component Value Date/Time   NA 137 04/18/2015 0809   NA 142 01/13/2015 0822   K 3.8 04/18/2015 0809   K 3.8 01/13/2015 0822   CL 106 01/13/2015 0822   CO2 25 04/18/2015 0809   CO2 26 01/13/2015 0822   BUN 18.5 04/18/2015 0809   BUN 8 01/13/2015 0822   CREATININE 0.8 04/18/2015 0809   CREATININE 0.77 01/13/2015 0822   CREATININE 0.80 12/01/2014 1437      Component Value Date/Time   CALCIUM 9.1 04/18/2015 0809   CALCIUM 9.1 01/13/2015 0822   ALKPHOS 140 04/18/2015 0809   ALKPHOS 65 12/01/2014 1437   AST 25 04/18/2015 0809   AST 28 12/01/2014 1437   ALT 57* 04/18/2015 0809   ALT 24 12/01/2014 1437   BILITOT 0.32 04/18/2015 0809   BILITOT 0.6 12/01/2014 1437       No results found for: LABCA2  No components found for: LABCA125  No results for input(s): INR in the last 168 hours.  Urinalysis    Component Value Date/Time   LABSPEC 1.005 04/03/2015 1451   LABSPEC  1.010 08/09/2007 2121   PHURINE 7.5 04/03/2015 1451   PHURINE 7.5 08/09/2007 2121   GLUCOSEU Negative 04/03/2015 1451   GLUCOSEU NEGATIVE 08/09/2007 2121   HGBUR Trace 04/03/2015 1451   HGBUR SMALL* 08/09/2007 2121   BILIRUBINUR Negative 04/03/2015 1451   BILIRUBINUR neg 12/01/2014 [place] 08/09/2007 2121   KETONESUR Negative 04/03/2015 [place] 08/09/2007 2121   PROTEINUR Negative 04/03/2015 1451   PROTEINUR neg 12/01/2014 [place] 08/09/2007 2121   UROBILINOGEN 0.2 04/03/2015 1451   UROBILINOGEN negative 12/01/2014 1358   UROBILINOGEN 0.2 08/09/2007 2121   NITRITE Negative 04/03/2015 1451   NITRITE neg 12/01/2014 1358   NITRITE NEGATIVE 08/09/2007 2121   LEUKOCYTESUR Small 04/03/2015 1451   LEUKOCYTESUR Negative 12/01/2014 1358    STUDIES: No results found.  ASSESSMENT: 59 y.o. [place] woman s/p left breast upper outer quadrant biopsy 12/19/2014 for a clinical T2 N0, stage IIA invasive ductal carcinoma, grade 2 or 3, estrogen and progesterone receptor negative, with an MIB-1 of 38%, HER-2 equivocal initially but positive on further testing with a ratio of 3.4  (1) neoadjuvant chemotherapy to consist of carboplatin, docetaxel, trastuzumab and pertuzumab given every 3 weeks for 6 cycles with Neulasta support--started 01/16/2015  (a) docetaxel switched for gemcitabine for final 2 cycles because of persistent neuropathy.  (2) definitive surgery to follow chemotherapy  (3) radiation to follow surgery  (4) genetics counselor felt the risk of  a familial mutation was sufficiently low testing could be omitted  PLAN: Rhonda Gardner is doing well today. The labs were reviewed in detail and her hgb is down to 8.8, however she is asymptomatic. Her platelets are also down to 95. We will continue to monitor these values.   Rhonda Gardner will return in 2 weeks for her 6th and final cycle of treatment. I have ordered a final MRI to follow. She  understands and agrees with this plan. She knows the goal of treatment in her case is cure. She has been encouraged to call with any issues that might arise before her next visit here.   Laurie Panda, NP   04/18/2015 9:28 AM

## 2015-05-01 ENCOUNTER — Telehealth: Payer: Self-pay | Admitting: Oncology

## 2015-05-01 ENCOUNTER — Other Ambulatory Visit (HOSPITAL_BASED_OUTPATIENT_CLINIC_OR_DEPARTMENT_OTHER): Payer: BLUE CROSS/BLUE SHIELD

## 2015-05-01 ENCOUNTER — Ambulatory Visit (HOSPITAL_BASED_OUTPATIENT_CLINIC_OR_DEPARTMENT_OTHER): Payer: BLUE CROSS/BLUE SHIELD

## 2015-05-01 ENCOUNTER — Ambulatory Visit (HOSPITAL_BASED_OUTPATIENT_CLINIC_OR_DEPARTMENT_OTHER): Payer: BLUE CROSS/BLUE SHIELD | Admitting: Oncology

## 2015-05-01 VITALS — BP 131/69 | HR 80 | Temp 98.2°F | Resp 18 | Ht 68.0 in | Wt 140.7 lb

## 2015-05-01 DIAGNOSIS — C50412 Malignant neoplasm of upper-outer quadrant of left female breast: Secondary | ICD-10-CM | POA: Diagnosis not present

## 2015-05-01 DIAGNOSIS — G629 Polyneuropathy, unspecified: Secondary | ICD-10-CM | POA: Diagnosis not present

## 2015-05-01 DIAGNOSIS — Z5112 Encounter for antineoplastic immunotherapy: Secondary | ICD-10-CM

## 2015-05-01 DIAGNOSIS — Z171 Estrogen receptor negative status [ER-]: Secondary | ICD-10-CM | POA: Diagnosis not present

## 2015-05-01 DIAGNOSIS — Z5111 Encounter for antineoplastic chemotherapy: Secondary | ICD-10-CM

## 2015-05-01 DIAGNOSIS — Z5189 Encounter for other specified aftercare: Secondary | ICD-10-CM | POA: Diagnosis not present

## 2015-05-01 LAB — COMPREHENSIVE METABOLIC PANEL (CC13)
ALK PHOS: 92 U/L (ref 40–150)
ALT: 78 U/L — ABNORMAL HIGH (ref 0–55)
AST: 34 U/L (ref 5–34)
Albumin: 4.1 g/dL (ref 3.5–5.0)
Anion Gap: 8 mEq/L (ref 3–11)
BILIRUBIN TOTAL: 0.36 mg/dL (ref 0.20–1.20)
BUN: 24.5 mg/dL (ref 7.0–26.0)
CO2: 23 mEq/L (ref 22–29)
Calcium: 9.9 mg/dL (ref 8.4–10.4)
Chloride: 107 mEq/L (ref 98–109)
Creatinine: 0.8 mg/dL (ref 0.6–1.1)
EGFR: 78 mL/min/{1.73_m2} — ABNORMAL LOW (ref >90)
Glucose: 111 mg/dl (ref 70–140)
POTASSIUM: 4.4 meq/L (ref 3.5–5.1)
SODIUM: 139 meq/L (ref 136–145)
TOTAL PROTEIN: 6.7 g/dL (ref 6.4–8.3)

## 2015-05-01 LAB — CBC WITH DIFFERENTIAL/PLATELET
BASO%: 0.1 % (ref 0.0–2.0)
Basophils Absolute: 0 10*3/uL (ref 0.0–0.1)
EOS ABS: 0 10*3/uL (ref 0.0–0.5)
EOS%: 0 % (ref 0.0–7.0)
HCT: 29 % — ABNORMAL LOW (ref 34.8–46.6)
HGB: 9.7 g/dL — ABNORMAL LOW (ref 11.6–15.9)
LYMPH%: 4.3 % — ABNORMAL LOW (ref 14.0–49.7)
MCH: 34.9 pg — AB (ref 25.1–34.0)
MCHC: 33.3 g/dL (ref 31.5–36.0)
MCV: 105 fL — AB (ref 79.5–101.0)
MONO#: 0.6 10*3/uL (ref 0.1–0.9)
MONO%: 6.6 % (ref 0.0–14.0)
NEUT%: 89 % — ABNORMAL HIGH (ref 38.4–76.8)
NEUTROS ABS: 8.1 10*3/uL — AB (ref 1.5–6.5)
PLATELETS: 290 10*3/uL (ref 145–400)
RBC: 2.77 10*6/uL — AB (ref 3.70–5.45)
RDW: 20 % — ABNORMAL HIGH (ref 11.2–14.5)
WBC: 9.1 10*3/uL (ref 3.9–10.3)
lymph#: 0.4 10*3/uL — ABNORMAL LOW (ref 0.9–3.3)

## 2015-05-01 MED ORDER — DIPHENHYDRAMINE HCL 25 MG PO CAPS
25.0000 mg | ORAL_CAPSULE | Freq: Once | ORAL | Status: AC
  Administered 2015-05-01: 25 mg via ORAL

## 2015-05-01 MED ORDER — SODIUM CHLORIDE 0.9 % IV SOLN
1400.0000 mg | Freq: Once | INTRAVENOUS | Status: AC
  Administered 2015-05-01: 1400 mg via INTRAVENOUS
  Filled 2015-05-01: qty 36.82

## 2015-05-01 MED ORDER — PEGFILGRASTIM 6 MG/0.6ML ~~LOC~~ PSKT
6.0000 mg | PREFILLED_SYRINGE | Freq: Once | SUBCUTANEOUS | Status: AC
  Administered 2015-05-01: 6 mg via SUBCUTANEOUS
  Filled 2015-05-01: qty 0.6

## 2015-05-01 MED ORDER — HEPARIN SOD (PORK) LOCK FLUSH 100 UNIT/ML IV SOLN
500.0000 [IU] | Freq: Once | INTRAVENOUS | Status: DC | PRN
  Filled 2015-05-01: qty 5

## 2015-05-01 MED ORDER — SODIUM CHLORIDE 0.9 % IJ SOLN
10.0000 mL | INTRAMUSCULAR | Status: DC | PRN
  Filled 2015-05-01: qty 10

## 2015-05-01 MED ORDER — SODIUM CHLORIDE 0.9 % IV SOLN
420.0000 mg | Freq: Once | INTRAVENOUS | Status: AC
  Administered 2015-05-01: 420 mg via INTRAVENOUS
  Filled 2015-05-01: qty 14

## 2015-05-01 MED ORDER — SODIUM CHLORIDE 0.9 % IV SOLN
Freq: Once | INTRAVENOUS | Status: AC
  Administered 2015-05-01: 10:00:00 via INTRAVENOUS
  Filled 2015-05-01: qty 8

## 2015-05-01 MED ORDER — DIPHENHYDRAMINE HCL 25 MG PO CAPS
ORAL_CAPSULE | ORAL | Status: AC
  Filled 2015-05-01: qty 1

## 2015-05-01 MED ORDER — ACETAMINOPHEN 325 MG PO TABS
650.0000 mg | ORAL_TABLET | Freq: Once | ORAL | Status: AC
  Administered 2015-05-01: 650 mg via ORAL

## 2015-05-01 MED ORDER — ACETAMINOPHEN 325 MG PO TABS
ORAL_TABLET | ORAL | Status: AC
  Filled 2015-05-01: qty 2

## 2015-05-01 MED ORDER — TRASTUZUMAB CHEMO INJECTION 440 MG
6.0000 mg/kg | Freq: Once | INTRAVENOUS | Status: AC
  Administered 2015-05-01: 399 mg via INTRAVENOUS
  Filled 2015-05-01: qty 19

## 2015-05-01 MED ORDER — SODIUM CHLORIDE 0.9 % IV SOLN
508.0000 mg | Freq: Once | INTRAVENOUS | Status: AC
  Administered 2015-05-01: 510 mg via INTRAVENOUS
  Filled 2015-05-01: qty 51

## 2015-05-01 MED ORDER — SODIUM CHLORIDE 0.9 % IV SOLN
Freq: Once | INTRAVENOUS | Status: AC
  Administered 2015-05-01: 10:00:00 via INTRAVENOUS

## 2015-05-01 NOTE — Addendum Note (Signed)
Addended by: Laureen Abrahams on: 05/01/2015 11:11 AM   Modules accepted: Orders, Medications

## 2015-05-01 NOTE — Telephone Encounter (Signed)
Gave avs & calendar for July thru October.

## 2015-05-01 NOTE — Progress Notes (Signed)
[facility]  [phone] Fax:([phone]) [phone]     ID: Rhonda Gardner DOB: 1956-03-26  MR#: [id number]  EPP#:[id number]  Patient Care Team: Rita Ohara, MD as PCP - General (Family Medicine) Chauncey Cruel, MD as Consulting Physician (Oncology) Thea Silversmith, MD as Consulting Physician (Radiation Oncology) Mauro Kaufmann, RN as Registered Nurse Rockwell Germany, RN as Registered Nurse Holley Bouche, NP as Nurse Practitioner (Nurse Practitioner) Autumn Messing III, MD as Consulting Physician (General Surgery) PCP: Vikki Ports, MD OTHER MD:  CHIEF COMPLAINT: Triple negative breast cancer  CURRENT TREATMENT:  neoadjuvant chemotherapy and anti-HER-2 immunotherapy   BREAST CANCER HISTORY: From the original intake note:  "Rhonda Gardner" had not had a physical exam in quite some time, and had not had a mammogram since 2020, when she was evaluated by Dr. Tomi Bamberger 12/01/2014. Dr. Tomi Bamberger was able to palpate a 2 cm firm mass at the 3:00 position in the left breast. The patient herself has been aware of multiple breast masses and does have a history of fibrocystic change, with multiple prior benign biopsies. She did not feel this particular mass had changed recently.   Dr. Tomi Bamberger arranged for bilateral diagnostic mammography and left breast ultrasonography at the [facility] Center 12/15/2014. This shows the breast density to be category C. In the left breast upper outer quadrant there was a bilobed mass measuring approximately 2 cm. This was palpable. Ultrasound confirmed an irregular microlobulated and hypoechoic mass measuring 2.1 cm at the 2:00 location 6 cm from the nipple. There was no left axillary adenopathy.  Biopsy of the mass in question 12/19/2014 showed ([id number]) invasive ductal carcinoma, grade 2 or 3, estrogen and progesterone receptor negative, with an MIB-1 of 38%, and equivocal HER-2 amplification, the signals ratio being 1.57, the number per cell 4.25.  On  12/27/2014 the patient underwent bilateral breast MRI. The mass in question at the 2:00 location of the left breast measured 2.5 cm. It directly abuts the left pectoralis major. There is no enhancement in the muscle itself. There were no abnormal appearing lymph nodes. There was an incidentally noted 1 cm hepatic cyst at the dome of the right liver lobe.  Her subsequent history is as detailed below  INTERVAL HISTORY: Rhonda Gardner returns today for follow up of her breast cancer, accompanied by her husband, [name]. Today is day 1, cycle 6 of 6 planned cycles of of gemcitabine, carboplatin, trastuzumab and pertuzumab given every 3 weeks, with neulasta given on day 2 for granulocyte support. Gemcitabine was substituted for docetaxel during this cycle because of progressive neuropathy.  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 She is a little bit of tingling in her toe tips and that is it. She still sleeps poorly, she has some fatigue after each infusion, lasting a few days, but then that clears. Same with blurred vision. She does have some sinus symptoms and mild epistaxis occasionally. She has a heartburn problem that predates her chemotherapy. She had a urinary tract infection recently. Her dog was diagnosed with Acinetobacter and is being treated for that but of course this does not affect her directly. She has intermittent joint pains here and there. Otherwise a detailed review of systems today was stable  PAST MEDICAL HISTORY: Past Medical History  Diagnosis Date  . Allergy 2006    chemical cauterization in spring 2006, and 2nd treatment with good results.(Dr.Shoemaker)  . Bipolar disorder   .  Breast cancer 12/2014    invasive ductal carcinoma (LEFT)  . Breast cancer of upper-outer quadrant of left female breast 12/22/2014    PAST SURGICAL HISTORY: Past Surgical History  Procedure Laterality Date  . Breast biopsy  Bilateral     benign and a right stereotatic breast biopsy.  . Foot surgery Left 2014    Dr. Paulla Dolly  . Fibroid excision  age 4    prolapsed fibroid removed    FAMILY HISTORY Family History  Problem Relation Age of Onset  . Cancer Father     skin - non melanoma - farmer  . Heart disease Father   . Hypertension Mother   . Arthritis Mother     osteoarthritis  . Colon cancer Mother   . Macular degeneration Mother     wet and dry  . Cancer Mother 47    colon cancer at age 83  . Osteoporosis Sister     osteopenia  . Cancer Maternal Aunt     lung cancer  . Diabetes Paternal Aunt   . Cancer Paternal Aunt 17    ? stomach cancer  . Autism Sister   . Mental retardation Sister   . Blindness Sister     secondary to retrolental fibroplasia  . Goiter Paternal Aunt    the patient's father lived to be 95. He had a history of nonmelanoma skin cancers. [name] mother is living at age 39. She was diagnosed with colon cancer in her 58s. A paternal aunt may have had stomach cancer. Maternal aunt developed lung cancer in her 56s. The patient has one brother, 2 sisters. One of her sisters is blind and mentally retarded. There is no history of breast or ovarian cancer in the family.  GYNECOLOGIC HISTORY:  No LMP recorded. Patient is postmenopausal. Menarche age 66. The patient is GX P0. She stopped having periods in her early 53s. She did not take hormone replacement. She took oral contraceptives remotely for over 5 years with no complications.  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 Her husband [name] worked in IT originally but now runs a family firm. It's just the 2 of them at home, plus a chocolate lab.    ADVANCED DIRECTIVES: In place   HEALTH MAINTENANCE: History  Substance Use Topics  . Smoking status: Never Smoker   . Smokeless tobacco: Never Used  . Alcohol Use: No     Colonoscopy: In [place] under Dr. Lavina Hamman, date not entirely clear  PAP: February  2016  Bone density: Under [facility], results not currently available  Lipid panel:  Allergies  Allergen Reactions  . Adhesive [Tape] Rash    Pt is sensitive to any kind of adhesive tape products.    Current Outpatient Prescriptions  Medication Sig Dispense Refill  . Alum & Mag Hydroxide-Simeth (MAGIC MOUTHWASH W/LIDOCAINE) SOLN Take 5 mLs by mouth 4 (four) times daily as needed for mouth pain ([facility] mouthwash with nystatin and lidocaine.). (Patient not taking: Reported on 02/14/2015) 240 mL 0  . Cholecalciferol (VITAMIN D) 2000 UNITS tablet Take 2,000 Units by mouth daily.    . cholestyramine (QUESTRAN) 4 G packet Take 1 packet (4 g total) by mouth 3 (three) times daily with meals. (Patient not taking: Reported on 02/14/2015) 60 each 12  . Cyanocobalamin (VITAMIN B-12) 5000 MCG SUBL Place 5,000 mcg under the tongue daily.    Marland Kitchen dexamethasone (DECADRON) 4 MG tablet TAKE 2 TABLETS BY MOUTH TWICE DAILY. START THE DAY BEFORE TAXOTERE, THEN  AGAIN THE DAY AFTER CHEMO FOR 3 DAYS (Patient not taking: Reported on 04/18/2015) 30 tablet 0  . Diphenhyd-Hydrocort-Nystatin (FIRST-DUKES MOUTHWASH) SUSP 5-10 ml QID prn swish swallow or spit 240 mL 3  . fluconazole (DIFLUCAN) 100 MG tablet Take 1 tablet (100 mg total) by mouth daily. 30 tablet 3  . LAMICTAL 200 MG tablet Take 0.5 tablets (100 mg total) by mouth daily. 90 tablet 0  . lidocaine-prilocaine (EMLA) cream Apply to affected area once (Patient not taking: Reported on 04/18/2015) 30 g 3  . loperamide (IMODIUM) 2 MG capsule Take 1 capsule (2 mg total) by mouth as needed for diarrhea or loose stools. 30 capsule 12  . loratadine (CLARITIN) 10 MG tablet Take one tablet on the day of neulasta shot, and repeat the next 2 days 100 tablet 3  . LORazepam (ATIVAN) 0.5 MG tablet Take 1 tablet (0.5 mg total) by mouth at bedtime as needed (Nausea or vomiting). (Patient not taking: Reported on 04/18/2015) 30 tablet 0  . LUTEIN-ZEAXANTHIN PO Take 1 tablet by mouth daily.     . Misc Natural Products (TART CHERRY ADVANCED) CAPS Take 1 capsule by mouth daily.    . Multiple Vitamins-Minerals (PRESERVISION AREDS) CAPS Take 1 capsule by mouth 2 (two) times daily.    . naproxen sodium (ANAPROX) 220 MG tablet Take 220-440 mg by mouth daily as needed (pain). Aleve    . omeprazole (PRILOSEC) 40 MG capsule Take 1 capsule (40 mg total) by mouth daily. 30 capsule 2  . ondansetron (ZOFRAN) 8 MG tablet Take 1 tablet (8 mg total) by mouth 2 (two) times daily. Start the day after chemo for 3 days. Then take as needed for nausea or vomiting. (Patient not taking: Reported on 03/06/2015) 30 tablet 1  . prochlorperazine (COMPAZINE) 10 MG tablet Take 1 tablet (10 mg total) by mouth every 6 (six) hours as needed (Nausea or vomiting). (Patient not taking: Reported on 01/27/2015) 30 tablet 1  . sucralfate (CARAFATE) 1 GM/10ML suspension Take 10 mLs (1 g total) by mouth 4 (four) times daily as needed. (Patient not taking: Reported on 03/06/2015) 420 mL 0  . tobramycin-dexamethasone (TOBRADEX) ophthalmic solution PLACE 1 DROP INTO BOTH EYES TWICE DAILY. 5 mL 0  . UNABLE TO FIND Take 30 mLs by mouth daily. Tart Cherry Juice.    . valACYclovir (VALTREX) 1000 MG tablet TAKE 1 TABLET BY MOUTH DAILY 30 tablet 0   No current facility-administered medications for this visit.    OBJECTIVE: Middle-aged white woman in no acute distress Filed Vitals:   05/01/15 0842  BP: 131/69  Pulse: 80  Temp: 98.2 F (36.8 C)  Resp: 18     Body mass index is 21.4 kg/(m^2).    ECOG FS:1 - Symptomatic but completely ambulatory   Sclerae unicteric, EOMs intact Oropharynx clear, dentition in good repair No cervical or supraclavicular adenopathy Lungs no rales or rhonchi Heart regular rate and rhythm Abd soft, nontender, positive bowel sounds MSK no focal spinal tenderness, no upper extremity lymphedema Neuro: nonfocal, well oriented, appropriate affect Breasts: Deferred    LAB RESULTS:  CMP       Component Value Date/Time   NA 139 05/01/2015 0828   NA 142 01/13/2015 0822   K 4.4 05/01/2015 0828   K 3.8 01/13/2015 0822   CL 106 01/13/2015 0822   CO2 23 05/01/2015 0828   CO2 26 01/13/2015 0822   GLUCOSE 111 05/01/2015 0828   GLUCOSE 83 01/13/2015 0822   BUN 24.5 05/01/2015  0828   BUN 8 01/13/2015 0822   CREATININE 0.8 05/01/2015 0828   CREATININE 0.77 01/13/2015 0822   CREATININE 0.80 12/01/2014 1437   CALCIUM 9.9 05/01/2015 0828   CALCIUM 9.1 01/13/2015 0822   PROT 6.7 05/01/2015 0828   PROT 7.0 12/01/2014 1437   ALBUMIN 4.1 05/01/2015 0828   ALBUMIN 4.8 12/01/2014 1437   AST 34 05/01/2015 0828   AST 28 12/01/2014 1437   ALT 78* 05/01/2015 0828   ALT 24 12/01/2014 1437   ALKPHOS 92 05/01/2015 0828   ALKPHOS 65 12/01/2014 1437   BILITOT 0.36 05/01/2015 0828   BILITOT 0.6 12/01/2014 1437   GFRNONAA 89* 01/13/2015 0822   GFRAA >90 01/13/2015 0822    INo results found for: SPEP, UPEP  Lab Results  Component Value Date   WBC 9.1 05/01/2015   NEUTROABS 8.1* 05/01/2015   HGB 9.7* 05/01/2015   HCT 29.0* 05/01/2015   MCV 105.0* 05/01/2015   PLT 290 05/01/2015      Chemistry      Component Value Date/Time   NA 139 05/01/2015 0828   NA 142 01/13/2015 0822   K 4.4 05/01/2015 0828   K 3.8 01/13/2015 0822   CL 106 01/13/2015 0822   CO2 23 05/01/2015 0828   CO2 26 01/13/2015 0822   BUN 24.5 05/01/2015 0828   BUN 8 01/13/2015 0822   CREATININE 0.8 05/01/2015 0828   CREATININE 0.77 01/13/2015 0822   CREATININE 0.80 12/01/2014 1437      Component Value Date/Time   CALCIUM 9.9 05/01/2015 0828   CALCIUM 9.1 01/13/2015 0822   ALKPHOS 92 05/01/2015 0828   ALKPHOS 65 12/01/2014 1437   AST 34 05/01/2015 0828   AST 28 12/01/2014 1437   ALT 78* 05/01/2015 0828   ALT 24 12/01/2014 1437   BILITOT 0.36 05/01/2015 0828   BILITOT 0.6 12/01/2014 1437       No results found for: LABCA2  No components found for: VZDGL875  No results for input(s): INR in the last  168 hours.  Urinalysis    Component Value Date/Time   LABSPEC 1.005 04/03/2015 1451   LABSPEC 1.010 08/09/2007 2121   PHURINE 7.5 04/03/2015 1451   PHURINE 7.5 08/09/2007 2121   GLUCOSEU Negative 04/03/2015 1451   GLUCOSEU NEGATIVE 08/09/2007 2121   HGBUR Trace 04/03/2015 1451   HGBUR SMALL* 08/09/2007 2121   BILIRUBINUR Negative 04/03/2015 1451   BILIRUBINUR neg 12/01/2014 [place] 08/09/2007 2121   KETONESUR Negative 04/03/2015 [place] 08/09/2007 2121   PROTEINUR Negative 04/03/2015 1451   PROTEINUR neg 12/01/2014 [place] 08/09/2007 2121   UROBILINOGEN 0.2 04/03/2015 1451   UROBILINOGEN negative 12/01/2014 1358   UROBILINOGEN 0.2 08/09/2007 2121   NITRITE Negative 04/03/2015 1451   NITRITE neg 12/01/2014 1358   NITRITE NEGATIVE 08/09/2007 2121   LEUKOCYTESUR Small 04/03/2015 1451   LEUKOCYTESUR Negative 12/01/2014 1358    STUDIES: No results found.  ASSESSMENT: 59 y.o. [place] woman s/p left breast upper outer quadrant biopsy 12/19/2014 for a clinical T2 N0, stage IIA invasive ductal carcinoma, grade 2 or 3, estrogen and progesterone receptor negative, with an MIB-1 of 38%, HER-2 equivocal initially but positive on further testing with a ratio of 3.4  (1) neoadjuvant chemotherapy to consist of carboplatin, docetaxel, trastuzumab and pertuzumab given every 3 weeks for 6 cycles with Neulasta support--started 01/16/2015  (a) docetaxel switched for gemcitabine for final 2 cycles because of persistent neuropathy.  (2) definitive  surgery to follow chemotherapy  (3) radiation to follow surgery  (4) genetics counselor felt the risk of a familial mutation was sufficiently low testing could be omitted  PLAN: Rhonda Gardner will complete her chemotherapy today. I am delighted that her peripheral neuropathy has just about completely resolved.  After this dose she will switch to trastuzumab alone every 3 weeks. Her first dose will  be August 8. I am going to see her again on August 29. I suspect by then she will have had her definitive surgery and we will be able to discuss her final pathology. After that of course she will see Dr. Pablo Ledger for her radiation treatments  I am hopeful that we will get very good news when she has her preop repeat breast MRI, which will be ordered by Dr. Marlou Starks.   Chauncey Cruel, MD   05/01/2015 9:37 AM

## 2015-05-01 NOTE — Patient Instructions (Signed)
[facility] Discharge Instructions for Patients Receiving Chemotherapy  Today you received the following chemotherapy agents Herceptin/Perjeta/Gemzar/Carboplatin.  To help prevent nausea and vomiting after your treatment, we encourage you to take your nausea medication as directed.   If you develop nausea and vomiting that is not controlled by your nausea medication, call the clinic.   BELOW ARE SYMPTOMS THAT SHOULD BE REPORTED IMMEDIATELY:  *FEVER GREATER THAN 100.5 F  *CHILLS WITH OR WITHOUT FEVER  NAUSEA AND VOMITING THAT IS NOT CONTROLLED WITH YOUR NAUSEA MEDICATION  *UNUSUAL SHORTNESS OF BREATH  *UNUSUAL BRUISING OR BLEEDING  TENDERNESS IN MOUTH AND THROAT WITH OR WITHOUT PRESENCE OF ULCERS  *URINARY PROBLEMS  *BOWEL PROBLEMS  UNUSUAL RASH Items with * indicate a potential emergency and should be followed up as soon as possible.  Feel free to call the clinic you have any questions or concerns. The clinic phone number is [phone].  Please show the [facility] at check-in to the Emergency Department and triage nurse.

## 2015-05-02 ENCOUNTER — Telehealth: Payer: Self-pay | Admitting: Oncology

## 2015-05-02 NOTE — Telephone Encounter (Signed)
Patient confirmed appointment moved to a later time on 07/25.

## 2015-05-08 ENCOUNTER — Other Ambulatory Visit: Payer: Self-pay | Admitting: *Deleted

## 2015-05-08 ENCOUNTER — Other Ambulatory Visit: Payer: BLUE CROSS/BLUE SHIELD

## 2015-05-08 ENCOUNTER — Ambulatory Visit (HOSPITAL_BASED_OUTPATIENT_CLINIC_OR_DEPARTMENT_OTHER): Payer: BLUE CROSS/BLUE SHIELD | Admitting: Nurse Practitioner

## 2015-05-08 ENCOUNTER — Other Ambulatory Visit: Payer: Self-pay | Admitting: Nurse Practitioner

## 2015-05-08 ENCOUNTER — Encounter: Payer: Self-pay | Admitting: *Deleted

## 2015-05-08 ENCOUNTER — Telehealth: Payer: Self-pay | Admitting: Nurse Practitioner

## 2015-05-08 ENCOUNTER — Ambulatory Visit: Payer: BLUE CROSS/BLUE SHIELD | Admitting: Nurse Practitioner

## 2015-05-08 ENCOUNTER — Encounter: Payer: Self-pay | Admitting: Nurse Practitioner

## 2015-05-08 ENCOUNTER — Other Ambulatory Visit (HOSPITAL_BASED_OUTPATIENT_CLINIC_OR_DEPARTMENT_OTHER): Payer: BLUE CROSS/BLUE SHIELD

## 2015-05-08 VITALS — BP 125/63 | HR 88 | Temp 98.8°F | Resp 18 | Ht 68.0 in | Wt 136.6 lb

## 2015-05-08 DIAGNOSIS — Z171 Estrogen receptor negative status [ER-]: Secondary | ICD-10-CM

## 2015-05-08 DIAGNOSIS — C50412 Malignant neoplasm of upper-outer quadrant of left female breast: Secondary | ICD-10-CM | POA: Diagnosis not present

## 2015-05-08 LAB — COMPREHENSIVE METABOLIC PANEL (CC13)
ALT: 33 U/L (ref 0–55)
AST: 16 U/L (ref 5–34)
Albumin: 4.1 g/dL (ref 3.5–5.0)
Alkaline Phosphatase: 161 U/L — ABNORMAL HIGH (ref 40–150)
Anion Gap: 10 mEq/L (ref 3–11)
BILIRUBIN TOTAL: 0.24 mg/dL (ref 0.20–1.20)
BUN: 15.3 mg/dL (ref 7.0–26.0)
CALCIUM: 9.3 mg/dL (ref 8.4–10.4)
CHLORIDE: 101 meq/L (ref 98–109)
CO2: 28 mEq/L (ref 22–29)
CREATININE: 0.8 mg/dL (ref 0.6–1.1)
EGFR: 76 mL/min/{1.73_m2} — ABNORMAL LOW (ref >90)
Glucose: 96 mg/dl (ref 70–140)
Potassium: 4.8 mEq/L (ref 3.5–5.1)
SODIUM: 139 meq/L (ref 136–145)
TOTAL PROTEIN: 6.5 g/dL (ref 6.4–8.3)

## 2015-05-08 LAB — CBC WITH DIFFERENTIAL/PLATELET
BASO%: 0.2 % (ref 0.0–2.0)
Basophils Absolute: 0 10*3/uL (ref 0.0–0.1)
EOS%: 0.2 % (ref 0.0–7.0)
Eosinophils Absolute: 0 10*3/uL (ref 0.0–0.5)
HEMATOCRIT: 31.4 % — AB (ref 34.8–46.6)
HEMOGLOBIN: 10.2 g/dL — AB (ref 11.6–15.9)
LYMPH%: 7.5 % — ABNORMAL LOW (ref 14.0–49.7)
MCH: 34.5 pg — ABNORMAL HIGH (ref 25.1–34.0)
MCHC: 32.5 g/dL (ref 31.5–36.0)
MCV: 106 fL — AB (ref 79.5–101.0)
MONO#: 0.8 10*3/uL (ref 0.1–0.9)
MONO%: 5.2 % (ref 0.0–14.0)
NEUT#: 13.8 10*3/uL — ABNORMAL HIGH (ref 1.5–6.5)
NEUT%: 86.9 % — AB (ref 38.4–76.8)
Platelets: 167 10*3/uL (ref 145–400)
RBC: 2.96 10*6/uL — ABNORMAL LOW (ref 3.70–5.45)
RDW: 17.7 % — AB (ref 11.2–14.5)
WBC: 15.9 10*3/uL — ABNORMAL HIGH (ref 3.9–10.3)
lymph#: 1.2 10*3/uL (ref 0.9–3.3)

## 2015-05-08 MED ORDER — LAMICTAL 200 MG PO TABS
100.0000 mg | ORAL_TABLET | Freq: Every day | ORAL | Status: DC
Start: 2015-05-08 — End: 2015-11-06

## 2015-05-08 NOTE — Progress Notes (Signed)
[facility]  [phone]) [phone] [phone]) [phone]     ID: Rhonda Gardner DOB: 29-Oct-1955  MR#: [id number]  KPT#:[id number]  Patient Care Team: Rita Ohara, MD as PCP - General (Family Medicine) Chauncey Cruel, MD as Consulting Physician (Oncology) Thea Silversmith, MD as Consulting Physician (Radiation Oncology) Mauro Kaufmann, RN as Registered Nurse Rockwell Germany, RN as Registered Nurse Holley Bouche, NP as Nurse Practitioner (Nurse Practitioner) Autumn Messing III, MD as Consulting Physician (General Surgery) PCP: Vikki Ports, MD OTHER MD:  CHIEF COMPLAINT: Triple negative breast cancer  CURRENT TREATMENT:  neoadjuvant chemotherapy and anti-HER-2 immunotherapy   BREAST CANCER HISTORY: From the original intake note:  "Rhonda Gardner" had not had a physical exam in quite some time, and had not had a mammogram since 2020, when she was evaluated by Dr. Tomi Bamberger 12/01/2014. Dr. Tomi Bamberger was able to palpate a 2 cm firm mass at the 3:00 position in the left breast. The patient herself has been aware of multiple breast masses and does have a history of fibrocystic change, with multiple prior benign biopsies. She did not feel this particular mass had changed recently.   Dr. Tomi Bamberger arranged for bilateral diagnostic mammography and left breast ultrasonography at the breast Center 12/15/2014. This shows the breast density to be category C. In the left breast upper outer quadrant there was a bilobed mass measuring approximately 2 cm. This was palpable. Ultrasound confirmed an irregular microlobulated and hypoechoic mass measuring 2.1 cm at the 2:00 location 6 cm from the nipple. There was no left axillary adenopathy.  Biopsy of the mass in question 12/19/2014 showed ([id number]) invasive ductal carcinoma, grade 2 or 3, estrogen and progesterone receptor negative, with an MIB-1 of 38%, and equivocal HER-2 amplification, the signals ratio being 1.57, the number per cell 4.25.  On  12/27/2014 the patient underwent bilateral breast MRI. The mass in question at the 2:00 location of the left breast measured 2.5 cm. It directly abuts the left pectoralis major. There is no enhancement in the muscle itself. There were no abnormal appearing lymph nodes. There was an incidentally noted 1 cm hepatic cyst at the dome of the right liver lobe.  Her subsequent history is as detailed below  INTERVAL HISTORY: Rhonda Gardner returns today for follow up of her breast cancer, accompanied by her husband, Rhonda Gardner. Today is day 8, cycle 6 of 6 planned cycles of of gemcitabine, carboplatin, trastuzumab and pertuzumab given every 3 weeks, with neulasta given on day 2 for granulocyte support. Gemcitabine was substituted for docetaxel during the last cycles because of progressive neuropathy. She barely feels any numbness or tingling to her finger tips now.   REVIEW OF SYSTEMS: Rhonda Gardner denies fevers, chills, nausea or vomiting. She had 1 episode of diarrhea that was resolved with imodium. Magic Mouthwash was helpful for her mouth tenderness. She denies thrush. Her appetite is decent. Her energy level is down, but she is able to get by. She denies shortness of breath, chest pain, cough, or palpitations. She has blurred vision. A detailed review of systems is otherwise stable.  PAST MEDICAL HISTORY: Past Medical History  Diagnosis Date  . Allergy 2006    chemical cauterization in spring 2006, and 2nd treatment with good results.(Dr.Shoemaker)  . Bipolar disorder   . Breast cancer 12/2014    invasive ductal carcinoma (LEFT)  . Breast cancer of upper-outer quadrant of left female breast 12/22/2014    PAST SURGICAL HISTORY: Past Surgical History  Procedure Laterality Date  .  Breast biopsy Bilateral     benign and a right stereotatic breast biopsy.  . Foot surgery Left 2014    Dr. Paulla Dolly  . Fibroid excision  age 36    prolapsed fibroid removed    FAMILY HISTORY Family History  Problem Relation Age of Onset    . Cancer Father     skin - non melanoma - farmer  . Heart disease Father   . Hypertension Mother   . Arthritis Mother     osteoarthritis  . Colon cancer Mother   . Macular degeneration Mother     wet and dry  . Cancer Mother 59    colon cancer at age 92  . Osteoporosis Sister     osteopenia  . Cancer Maternal Aunt     lung cancer  . Diabetes Paternal Aunt   . Cancer Paternal Aunt 66    ? stomach cancer  . Autism Sister   . Mental retardation Sister   . Blindness Sister     secondary to retrolental fibroplasia  . Goiter Paternal Aunt    the patient's father lived to be 95. He had a history of nonmelanoma skin cancers. [name] mother is living at age 57. She was diagnosed with colon cancer in her 33s. A paternal aunt may have had stomach cancer. Maternal aunt developed lung cancer in her 64s. The patient has one brother, 2 sisters. One of her sisters is blind and mentally retarded. There is no history of breast or ovarian cancer in the family.  GYNECOLOGIC HISTORY:  No LMP recorded. Patient is postmenopausal. Menarche age 12. The patient is GX P0. She stopped having periods in her early 92s. She did not take hormone replacement. She took oral contraceptives remotely for over 5 years with no complications.  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 Her husband Rhonda Gardner worked in IT originally but now runs a family firm. It's just the 2 of them at home, plus a chocolate lab.    ADVANCED DIRECTIVES: In place   HEALTH MAINTENANCE: History  Substance Use Topics  . Smoking status: Never Smoker   . Smokeless tobacco: Never Used  . Alcohol Use: No     Colonoscopy: In [place] under Dr. Lavina Hamman, date not entirely clear  PAP: February 2016  Bone density: Under S[facility] Radon, results not currently available  Lipid panel:  Allergies  Allergen Reactions  . Adhesive [Tape] Rash    Pt is sensitive to any kind of adhesive tape products.    Current Outpatient  Prescriptions  Medication Sig Dispense Refill  . Cholecalciferol (VITAMIN D) 2000 UNITS tablet Take 2,000 Units by mouth daily.    . Cyanocobalamin (VITAMIN B-12) 5000 MCG SUBL Place 5,000 mcg under the tongue daily.    . Diphenhyd-Hydrocort-Nystatin (FIRST-DUKES MOUTHWASH) SUSP 5-10 ml QID prn swish swallow or spit 240 mL 3  . lidocaine-prilocaine (EMLA) cream Apply to affected area once 30 g 3  . loperamide (IMODIUM) 2 MG capsule Take 1 capsule (2 mg total) by mouth as needed for diarrhea or loose stools. 30 capsule 12  . LUTEIN-ZEAXANTHIN PO Take 1 tablet by mouth daily.    . Misc Natural Products (TART CHERRY ADVANCED) CAPS Take 1 capsule by mouth daily.    . Multiple Vitamins-Minerals (PRESERVISION AREDS) CAPS Take 1 capsule by mouth 2 (two) times daily.    . naproxen sodium (ANAPROX) 220 MG tablet Take 220-440 mg by mouth daily as needed (pain). Aleve    . valACYclovir (  VALTREX) 1000 MG tablet TAKE 1 TABLET BY MOUTH DAILY 30 tablet 0  . Alum & Mag Hydroxide-Simeth (MAGIC MOUTHWASH W/LIDOCAINE) SOLN Take 5 mLs by mouth 4 (four) times daily as needed for mouth pain ([employer] mouthwash with nystatin and lidocaine.). (Patient not taking: Reported on 05/08/2015) 240 mL 0  . fluconazole (DIFLUCAN) 100 MG tablet Take 1 tablet (100 mg total) by mouth daily. (Patient not taking: Reported on 05/01/2015) 30 tablet 3  . LAMICTAL 200 MG tablet Take 0.5 tablets (100 mg total) by mouth daily. 90 tablet 0   No current facility-administered medications for this visit.    OBJECTIVE: Middle-aged white woman in no acute distress Filed Vitals:   05/08/15 1430  BP: 125/63  Pulse: 88  Temp: 98.8 F (37.1 C)  Resp: 18     Body mass index is 20.77 kg/(m^2).    ECOG FS:1 - Symptomatic but completely ambulatory   Skin: warm, dry  HEENT: sclerae anicteric, conjunctivae pink, oropharynx clear. No thrush or mucositis.  Lymph Nodes: No cervical or supraclavicular lymphadenopathy  Lungs: clear to auscultation  bilaterally, no rales, wheezes, or rhonci  Heart: regular rate and rhythm  Abdomen: round, soft, non tender, positive bowel sounds  Musculoskeletal: No focal spinal tenderness, no peripheral edema  Neuro: non focal, well oriented, positive affect  Breasts: deferred   LAB RESULTS:  CMP     Component Value Date/Time   NA 139 05/08/2015 1417   NA 142 01/13/2015 0822   K 4.8 05/08/2015 1417   K 3.8 01/13/2015 0822   CL 106 01/13/2015 0822   CO2 28 05/08/2015 1417   CO2 26 01/13/2015 0822   GLUCOSE 96 05/08/2015 1417   GLUCOSE 83 01/13/2015 0822   BUN 15.3 05/08/2015 1417   BUN 8 01/13/2015 0822   CREATININE 0.8 05/08/2015 1417   CREATININE 0.77 01/13/2015 0822   CREATININE 0.80 12/01/2014 1437   CALCIUM 9.3 05/08/2015 1417   CALCIUM 9.1 01/13/2015 0822   PROT 6.5 05/08/2015 1417   PROT 7.0 12/01/2014 1437   ALBUMIN 4.1 05/08/2015 1417   ALBUMIN 4.8 12/01/2014 1437   AST 16 05/08/2015 1417   AST 28 12/01/2014 1437   ALT 33 05/08/2015 1417   ALT 24 12/01/2014 1437   ALKPHOS 161* 05/08/2015 1417   ALKPHOS 65 12/01/2014 1437   BILITOT 0.24 05/08/2015 1417   BILITOT 0.6 12/01/2014 1437   GFRNONAA 89* 01/13/2015 0822   GFRAA >90 01/13/2015 0822    INo results found for: SPEP, UPEP  Lab Results  Component Value Date   WBC 15.9* 05/08/2015   NEUTROABS 13.8* 05/08/2015   HGB 10.2* 05/08/2015   HCT 31.4* 05/08/2015   MCV 106.0* 05/08/2015   PLT 167 05/08/2015      Chemistry      Component Value Date/Time   NA 139 05/08/2015 1417   NA 142 01/13/2015 0822   K 4.8 05/08/2015 1417   K 3.8 01/13/2015 0822   CL 106 01/13/2015 0822   CO2 28 05/08/2015 1417   CO2 26 01/13/2015 0822   BUN 15.3 05/08/2015 1417   BUN 8 01/13/2015 0822   CREATININE 0.8 05/08/2015 1417   CREATININE 0.77 01/13/2015 0822   CREATININE 0.80 12/01/2014 1437      Component Value Date/Time   CALCIUM 9.3 05/08/2015 1417   CALCIUM 9.1 01/13/2015 0822   ALKPHOS 161* 05/08/2015 1417   ALKPHOS  65 12/01/2014 1437   AST 16 05/08/2015 1417   AST 28 12/01/2014 1437  ALT 33 05/08/2015 1417   ALT 24 12/01/2014 1437   BILITOT 0.24 05/08/2015 1417   BILITOT 0.6 12/01/2014 1437       No results found for: LABCA2  No components found for: [id number]  No results for input(s): INR in the last 168 hours.  Urinalysis    Component Value Date/Time   LABSPEC 1.005 04/03/2015 1451   LABSPEC 1.010 08/09/2007 2121   PHURINE 7.5 04/03/2015 1451   PHURINE 7.5 08/09/2007 2121   GLUCOSEU Negative 04/03/2015 1451   GLUCOSEU NEGATIVE 08/09/2007 2121   HGBUR Trace 04/03/2015 1451   HGBUR SMALL* 08/09/2007 2121   BILIRUBINUR Negative 04/03/2015 1451   BILIRUBINUR neg 12/01/2014 [place] 08/09/2007 2121   KETONESUR Negative 04/03/2015 [place] 08/09/2007 2121   PROTEINUR Negative 04/03/2015 1451   PROTEINUR neg 12/01/2014 [place] 08/09/2007 2121   UROBILINOGEN 0.2 04/03/2015 1451   UROBILINOGEN negative 12/01/2014 1358   UROBILINOGEN 0.2 08/09/2007 2121   NITRITE Negative 04/03/2015 1451   NITRITE neg 12/01/2014 1358   NITRITE NEGATIVE 08/09/2007 2121   LEUKOCYTESUR Small 04/03/2015 1451   LEUKOCYTESUR Negative 12/01/2014 1358    STUDIES: No results found.  ASSESSMENT: 59 y.o. [place] woman s/p left breast upper outer quadrant biopsy 12/19/2014 for a clinical T2 N0, stage IIA invasive ductal carcinoma, grade 2 or 3, estrogen and progesterone receptor negative, with an MIB-1 of 38%, HER-2 equivocal initially but positive on further testing with a ratio of 3.4  (1) neoadjuvant chemotherapy to consist of carboplatin, docetaxel, trastuzumab and pertuzumab given every 3 weeks for 6 cycles with Neulasta support--started 01/16/2015  (a) docetaxel switched for gemcitabine for final 2 cycles because of persistent neuropathy.  (2) definitive surgery to follow chemotherapy  (3) radiation to follow surgery  (4) genetics counselor  felt the risk of a familial mutation was sufficiently low testing could be omitted  PLAN: Rhonda Gardner is excited to have completed chemotherapy. The labs were reviewed in detail and were entirely stable. She will proceed to surgery next. For some reason her final breast MRI has not been scheduled, so I reordered this today.   Rhonda Gardner will continue on trastuzumab alone every 3 weeks for a year now. Her next echocardiogram will be due in late September. She understands and agrees with this plan. She knows the goal of treatment in her case is cure. She has been encouraged to call with any issues that might arise before her next visit here.   Laurie Panda, NP   05/08/2015 4:02 PM

## 2015-05-08 NOTE — Telephone Encounter (Signed)
Mri order noted and central scheduling will call with this appointment

## 2015-05-09 ENCOUNTER — Other Ambulatory Visit: Payer: Self-pay | Admitting: General Surgery

## 2015-05-09 DIAGNOSIS — C50912 Malignant neoplasm of unspecified site of left female breast: Secondary | ICD-10-CM

## 2015-05-12 ENCOUNTER — Other Ambulatory Visit: Payer: Self-pay | Admitting: Surgery

## 2015-05-12 DIAGNOSIS — C50912 Malignant neoplasm of unspecified site of left female breast: Secondary | ICD-10-CM

## 2015-05-15 ENCOUNTER — Other Ambulatory Visit: Payer: Self-pay | Admitting: General Surgery

## 2015-05-15 DIAGNOSIS — C50412 Malignant neoplasm of upper-outer quadrant of left female breast: Secondary | ICD-10-CM

## 2015-05-18 ENCOUNTER — Ambulatory Visit
Admission: RE | Admit: 2015-05-18 | Discharge: 2015-05-18 | Disposition: A | Discharge status: Home / Self Care | Payer: BLUE CROSS/BLUE SHIELD | Source: Ambulatory Visit | Attending: General Surgery | Admitting: General Surgery

## 2015-05-18 MED ORDER — GADOBENATE DIMEGLUMINE 529 MG/ML IV SOLN
12.0000 mL | Freq: Once | INTRAVENOUS | Status: AC | PRN
  Administered 2015-05-18: 12 mL via INTRAVENOUS

## 2015-05-19 ENCOUNTER — Encounter (HOSPITAL_BASED_OUTPATIENT_CLINIC_OR_DEPARTMENT_OTHER): Payer: Self-pay | Admitting: *Deleted

## 2015-05-22 ENCOUNTER — Ambulatory Visit (HOSPITAL_BASED_OUTPATIENT_CLINIC_OR_DEPARTMENT_OTHER): Payer: BLUE CROSS/BLUE SHIELD

## 2015-05-22 ENCOUNTER — Other Ambulatory Visit (HOSPITAL_BASED_OUTPATIENT_CLINIC_OR_DEPARTMENT_OTHER): Payer: BLUE CROSS/BLUE SHIELD

## 2015-05-22 VITALS — BP 102/69 | HR 77 | Temp 98.6°F | Resp 18

## 2015-05-22 DIAGNOSIS — C50412 Malignant neoplasm of upper-outer quadrant of left female breast: Secondary | ICD-10-CM

## 2015-05-22 DIAGNOSIS — Z5112 Encounter for antineoplastic immunotherapy: Secondary | ICD-10-CM | POA: Diagnosis not present

## 2015-05-22 LAB — COMPREHENSIVE METABOLIC PANEL (CC13)
ALT: 46 U/L (ref 0–55)
AST: 30 U/L (ref 5–34)
Albumin: 3.8 g/dL (ref 3.5–5.0)
Alkaline Phosphatase: 84 U/L (ref 40–150)
Anion Gap: 6 mEq/L (ref 3–11)
BILIRUBIN TOTAL: 0.44 mg/dL (ref 0.20–1.20)
BUN: 13.7 mg/dL (ref 7.0–26.0)
CALCIUM: 8.8 mg/dL (ref 8.4–10.4)
CHLORIDE: 109 meq/L (ref 98–109)
CO2: 29 meq/L (ref 22–29)
Creatinine: 0.8 mg/dL (ref 0.6–1.1)
EGFR: 86 mL/min/{1.73_m2} — AB (ref >90)
Glucose: 85 mg/dl (ref 70–140)
Potassium: 4.5 mEq/L (ref 3.5–5.1)
SODIUM: 144 meq/L (ref 136–145)
Total Protein: 6.1 g/dL — ABNORMAL LOW (ref 6.4–8.3)

## 2015-05-22 LAB — CBC WITH DIFFERENTIAL/PLATELET
BASO%: 0.4 % (ref 0.0–2.0)
BASOS ABS: 0 10*3/uL (ref 0.0–0.1)
EOS ABS: 0 10*3/uL (ref 0.0–0.5)
EOS%: 0.1 % (ref 0.0–7.0)
HCT: 28.4 % — ABNORMAL LOW (ref 34.8–46.6)
HGB: 9.4 g/dL — ABNORMAL LOW (ref 11.6–15.9)
LYMPH#: 0.8 10*3/uL — AB (ref 0.9–3.3)
LYMPH%: 13.2 % — ABNORMAL LOW (ref 14.0–49.7)
MCH: 35.1 pg — AB (ref 25.1–34.0)
MCHC: 33.1 g/dL (ref 31.5–36.0)
MCV: 106.1 fL — ABNORMAL HIGH (ref 79.5–101.0)
MONO#: 0.6 10*3/uL (ref 0.1–0.9)
MONO%: 10.6 % (ref 0.0–14.0)
NEUT%: 75.7 % (ref 38.4–76.8)
NEUTROS ABS: 4.4 10*3/uL (ref 1.5–6.5)
Platelets: 243 10*3/uL (ref 145–400)
RBC: 2.68 10*6/uL — ABNORMAL LOW (ref 3.70–5.45)
RDW: 16.9 % — ABNORMAL HIGH (ref 11.2–14.5)
WBC: 5.8 10*3/uL (ref 3.9–10.3)

## 2015-05-22 MED ORDER — DIPHENHYDRAMINE HCL 25 MG PO CAPS
25.0000 mg | ORAL_CAPSULE | Freq: Once | ORAL | Status: AC
  Administered 2015-05-22: 25 mg via ORAL

## 2015-05-22 MED ORDER — DIPHENHYDRAMINE HCL 25 MG PO CAPS
ORAL_CAPSULE | ORAL | Status: AC
  Filled 2015-05-22: qty 1

## 2015-05-22 MED ORDER — ACETAMINOPHEN 325 MG PO TABS
650.0000 mg | ORAL_TABLET | Freq: Once | ORAL | Status: AC
  Administered 2015-05-22: 650 mg via ORAL

## 2015-05-22 MED ORDER — TRASTUZUMAB CHEMO INJECTION 440 MG
6.0000 mg/kg | Freq: Once | INTRAVENOUS | Status: AC
  Administered 2015-05-22: 399 mg via INTRAVENOUS
  Filled 2015-05-22: qty 19

## 2015-05-22 MED ORDER — SODIUM CHLORIDE 0.9 % IV SOLN
Freq: Once | INTRAVENOUS | Status: AC
Start: 2015-05-22 — End: 2015-05-22
  Administered 2015-05-22: 12:00:00 via INTRAVENOUS

## 2015-05-22 NOTE — Patient Instructions (Signed)
[facility] Discharge Instructions for Patients Receiving Chemotherapy  Today you received the following chemotherapy agents Herceptin  To help prevent nausea and vomiting after your treatment, we encourage you to take your nausea medication    If you develop nausea and vomiting that is not controlled by your nausea medication, call the clinic.   BELOW ARE SYMPTOMS THAT SHOULD BE REPORTED IMMEDIATELY:  *FEVER GREATER THAN 100.5 F  *CHILLS WITH OR WITHOUT FEVER  NAUSEA AND VOMITING THAT IS NOT CONTROLLED WITH YOUR NAUSEA MEDICATION  *UNUSUAL SHORTNESS OF BREATH  *UNUSUAL BRUISING OR BLEEDING  TENDERNESS IN MOUTH AND THROAT WITH OR WITHOUT PRESENCE OF ULCERS  *URINARY PROBLEMS  *BOWEL PROBLEMS  UNUSUAL RASH Items with * indicate a potential emergency and should be followed up as soon as possible.  Feel free to call the clinic you have any questions or concerns. The clinic phone number is [phone].  Please show the CHEMO ALERT CARD at check-in to the Emergency Department and triage nurse.   

## 2015-05-23 ENCOUNTER — Ambulatory Visit
Admission: RE | Admit: 2015-05-23 | Discharge: 2015-05-23 | Disposition: A | Discharge status: Home / Self Care | Payer: BLUE CROSS/BLUE SHIELD | Source: Ambulatory Visit | Attending: Surgery | Admitting: Surgery

## 2015-05-23 ENCOUNTER — Other Ambulatory Visit: Payer: Self-pay | Admitting: Surgery

## 2015-05-23 DIAGNOSIS — C50912 Malignant neoplasm of unspecified site of left female breast: Secondary | ICD-10-CM

## 2015-05-25 ENCOUNTER — Encounter (HOSPITAL_BASED_OUTPATIENT_CLINIC_OR_DEPARTMENT_OTHER): Payer: Self-pay | Admitting: Anesthesiology

## 2015-05-25 ENCOUNTER — Ambulatory Visit (HOSPITAL_BASED_OUTPATIENT_CLINIC_OR_DEPARTMENT_OTHER)
Admission: RE | Admit: 2015-05-25 | Discharge: 2015-05-25 | Disposition: A | Discharge status: Home / Self Care | Payer: BLUE CROSS/BLUE SHIELD | Source: Ambulatory Visit | Attending: General Surgery | Admitting: General Surgery

## 2015-05-25 ENCOUNTER — Encounter (HOSPITAL_COMMUNITY)
Admission: RE | Admit: 2015-05-25 | Discharge: 2015-05-25 | Disposition: A | Discharge status: Home / Self Care | Payer: BLUE CROSS/BLUE SHIELD | Source: Ambulatory Visit | Attending: General Surgery | Admitting: General Surgery

## 2015-05-25 ENCOUNTER — Ambulatory Visit
Admission: RE | Admit: 2015-05-25 | Discharge: 2015-05-25 | Disposition: A | Discharge status: Home / Self Care | Payer: BLUE CROSS/BLUE SHIELD | Source: Ambulatory Visit | Attending: Surgery | Admitting: Surgery

## 2015-05-25 ENCOUNTER — Ambulatory Visit (HOSPITAL_BASED_OUTPATIENT_CLINIC_OR_DEPARTMENT_OTHER): Payer: BLUE CROSS/BLUE SHIELD | Admitting: Anesthesiology

## 2015-05-25 ENCOUNTER — Encounter (HOSPITAL_BASED_OUTPATIENT_CLINIC_OR_DEPARTMENT_OTHER)
Admission: RE | Disposition: A | Discharge status: Home / Self Care | Payer: Self-pay | Source: Ambulatory Visit | Attending: General Surgery

## 2015-05-25 DIAGNOSIS — C50912 Malignant neoplasm of unspecified site of left female breast: Secondary | ICD-10-CM | POA: Insufficient documentation

## 2015-05-25 DIAGNOSIS — D0592 Unspecified type of carcinoma in situ of left breast: Secondary | ICD-10-CM | POA: Diagnosis not present

## 2015-05-25 DIAGNOSIS — C50412 Malignant neoplasm of upper-outer quadrant of left female breast: Secondary | ICD-10-CM

## 2015-05-25 HISTORY — PX: RADIOACTIVE SEED GUIDED PARTIAL MASTECTOMY WITH AXILLARY SENTINEL LYMPH NODE BIOPSY: SHX6520

## 2015-05-25 HISTORY — DX: Other specified postprocedural states: Z98.890

## 2015-05-25 HISTORY — DX: Nausea with vomiting, unspecified: R11.2

## 2015-05-25 LAB — POCT HEMOGLOBIN-HEMACUE
HEMOGLOBIN: 11.5 g/dL — AB (ref 12.0–15.0)
Hemoglobin: 11.5 g/dL — ABNORMAL LOW (ref 12.0–15.0)

## 2015-05-25 SURGERY — RADIOACTIVE SEED GUIDED PARTIAL MASTECTOMY WITH AXILLARY SENTINEL LYMPH NODE BIOPSY
Anesthesia: Regional | Site: Breast | Laterality: Left

## 2015-05-25 MED ORDER — LIDOCAINE HCL (CARDIAC) 20 MG/ML IV SOLN
INTRAVENOUS | Status: DC | PRN
  Administered 2015-05-25: 50 mg via INTRAVENOUS

## 2015-05-25 MED ORDER — FENTANYL CITRATE (PF) 100 MCG/2ML IJ SOLN
50.0000 ug | INTRAMUSCULAR | Status: DC | PRN
  Administered 2015-05-25: 100 ug via INTRAVENOUS

## 2015-05-25 MED ORDER — DEXAMETHASONE SODIUM PHOSPHATE 4 MG/ML IJ SOLN
INTRAMUSCULAR | Status: DC | PRN
  Administered 2015-05-25: 10 mg via INTRAVENOUS

## 2015-05-25 MED ORDER — OXYCODONE-ACETAMINOPHEN 5-325 MG PO TABS: 1.0000 | ORAL_TABLET | ORAL | Status: DC | PRN

## 2015-05-25 MED ORDER — OXYCODONE HCL 5 MG PO TABS
ORAL_TABLET | ORAL | Status: AC
  Filled 2015-05-25: qty 1

## 2015-05-25 MED ORDER — SODIUM CHLORIDE 0.9 % IJ SOLN
INTRAMUSCULAR | Status: DC | PRN
  Administered 2015-05-25: 5 mL

## 2015-05-25 MED ORDER — CEFAZOLIN SODIUM-DEXTROSE 2-3 GM-% IV SOLR
2.0000 g | INTRAVENOUS | Status: AC
  Administered 2015-05-25: 2 g via INTRAVENOUS

## 2015-05-25 MED ORDER — BUPIVACAINE HCL (PF) 0.25 % IJ SOLN
INTRAMUSCULAR | Status: DC | PRN
  Administered 2015-05-25: 6 mL
  Administered 2015-05-25: 9.5 mL

## 2015-05-25 MED ORDER — MEPERIDINE HCL 25 MG/ML IJ SOLN: 6.2500 mg | INTRAMUSCULAR | Status: DC | PRN

## 2015-05-25 MED ORDER — OXYCODONE HCL 5 MG/5ML PO SOLN: 5.0000 mg | Freq: Once | ORAL | Status: AC | PRN

## 2015-05-25 MED ORDER — FENTANYL CITRATE (PF) 100 MCG/2ML IJ SOLN
INTRAMUSCULAR | Status: AC
  Filled 2015-05-25: qty 6

## 2015-05-25 MED ORDER — OXYCODONE HCL 5 MG PO TABS
5.0000 mg | ORAL_TABLET | Freq: Once | ORAL | Status: AC | PRN
  Administered 2015-05-25: 5 mg via ORAL

## 2015-05-25 MED ORDER — MIDAZOLAM HCL 2 MG/2ML IJ SOLN
1.0000 mg | INTRAMUSCULAR | Status: DC | PRN
  Administered 2015-05-25: 2 mg via INTRAVENOUS

## 2015-05-25 MED ORDER — ONDANSETRON HCL 4 MG/2ML IJ SOLN
INTRAMUSCULAR | Status: DC | PRN
  Administered 2015-05-25: 4 mg via INTRAVENOUS

## 2015-05-25 MED ORDER — CHLORHEXIDINE GLUCONATE 4 % EX LIQD: 1.0000 "application " | Freq: Once | CUTANEOUS | Status: DC

## 2015-05-25 MED ORDER — HYDROMORPHONE HCL 1 MG/ML IJ SOLN
INTRAMUSCULAR | Status: AC
  Filled 2015-05-25: qty 1

## 2015-05-25 MED ORDER — TECHNETIUM TC 99M SULFUR COLLOID FILTERED
1.0000 | Freq: Once | INTRAVENOUS | Status: AC | PRN
  Administered 2015-05-25: 1 via INTRADERMAL

## 2015-05-25 MED ORDER — MIDAZOLAM HCL 2 MG/2ML IJ SOLN
INTRAMUSCULAR | Status: AC
  Filled 2015-05-25: qty 2

## 2015-05-25 MED ORDER — FENTANYL CITRATE (PF) 100 MCG/2ML IJ SOLN
INTRAMUSCULAR | Status: DC | PRN
  Administered 2015-05-25: 100 ug via INTRAVENOUS
  Administered 2015-05-25: 50 ug via INTRAVENOUS

## 2015-05-25 MED ORDER — SCOPOLAMINE 1 MG/3DAYS TD PT72
MEDICATED_PATCH | TRANSDERMAL | Status: AC
  Filled 2015-05-25: qty 1

## 2015-05-25 MED ORDER — PROPOFOL 10 MG/ML IV BOLUS
INTRAVENOUS | Status: DC | PRN
  Administered 2015-05-25: 200 mg via INTRAVENOUS

## 2015-05-25 MED ORDER — SCOPOLAMINE 1 MG/3DAYS TD PT72
1.0000 | MEDICATED_PATCH | Freq: Once | TRANSDERMAL | Status: DC | PRN
  Administered 2015-05-25: 1.5 mg via TRANSDERMAL

## 2015-05-25 MED ORDER — HYDROMORPHONE HCL 1 MG/ML IJ SOLN
0.2500 mg | INTRAMUSCULAR | Status: DC | PRN
  Administered 2015-05-25 (×2): 0.5 mg via INTRAVENOUS

## 2015-05-25 MED ORDER — CEFAZOLIN SODIUM-DEXTROSE 2-3 GM-% IV SOLR
INTRAVENOUS | Status: AC
  Filled 2015-05-25: qty 50

## 2015-05-25 MED ORDER — LACTATED RINGERS IV SOLN
INTRAVENOUS | Status: DC
  Administered 2015-05-25 (×2): via INTRAVENOUS

## 2015-05-25 MED ORDER — GLYCOPYRROLATE 0.2 MG/ML IJ SOLN: 0.2000 mg | Freq: Once | INTRAMUSCULAR | Status: DC | PRN

## 2015-05-25 MED ORDER — FENTANYL CITRATE (PF) 100 MCG/2ML IJ SOLN
INTRAMUSCULAR | Status: AC
  Filled 2015-05-25: qty 2

## 2015-05-25 MED ORDER — MIDAZOLAM HCL 5 MG/5ML IJ SOLN
INTRAMUSCULAR | Status: DC | PRN
  Administered 2015-05-25: 2 mg via INTRAVENOUS

## 2015-05-25 SURGICAL SUPPLY — 47 items
APPLIER CLIP 9.375 MED OPEN (MISCELLANEOUS) ×2
APR CLP MED 9.3 20 MLT OPN (MISCELLANEOUS) ×1
BLADE SURG 15 STRL LF DISP TIS (BLADE) ×1 IMPLANT
BLADE SURG 15 STRL SS (BLADE) ×2
CANISTER SUC SOCK COL 7IN (MISCELLANEOUS) IMPLANT
CANISTER SUCT 1200ML W/VALVE (MISCELLANEOUS) IMPLANT
CHLORAPREP W/TINT 26ML (MISCELLANEOUS) ×2 IMPLANT
CLIP APPLIE 9.375 MED OPEN (MISCELLANEOUS) ×1 IMPLANT
COVER BACK TABLE 60X90IN (DRAPES) ×2 IMPLANT
COVER MAYO STAND STRL (DRAPES) ×2 IMPLANT
COVER PROBE W GEL 5X96 (DRAPES) ×2 IMPLANT
DECANTER SPIKE VIAL GLASS SM (MISCELLANEOUS) ×2 IMPLANT
DEVICE DUBIN W/COMP PLATE 8390 (MISCELLANEOUS) ×2 IMPLANT
DRAPE LAPAROSCOPIC ABDOMINAL (DRAPES) ×2 IMPLANT
DRAPE UTILITY XL STRL (DRAPES) ×2 IMPLANT
ELECT COATED BLADE 2.86 ST (ELECTRODE) ×2 IMPLANT
ELECT REM PT RETURN 9FT ADLT (ELECTROSURGICAL) ×2
ELECTRODE REM PT RTRN 9FT ADLT (ELECTROSURGICAL) ×1 IMPLANT
GLOVE BIO SURGEON STRL SZ 6.5 (GLOVE) ×2 IMPLANT
GLOVE BIO SURGEON STRL SZ7.5 (GLOVE) ×2 IMPLANT
GLOVE BIOGEL M 7.0 STRL (GLOVE) ×4 IMPLANT
GLOVE BIOGEL PI IND STRL 8 (GLOVE) ×1 IMPLANT
GLOVE BIOGEL PI INDICATOR 8 (GLOVE) ×1
GLOVE SS BIOGEL STRL SZ 7.5 (GLOVE) ×1 IMPLANT
GLOVE SUPERSENSE BIOGEL SZ 7.5 (GLOVE) ×1
GOWN STRL REUS W/ TWL LRG LVL3 (GOWN DISPOSABLE) ×2 IMPLANT
GOWN STRL REUS W/ TWL XL LVL3 (GOWN DISPOSABLE) ×1 IMPLANT
GOWN STRL REUS W/TWL LRG LVL3 (GOWN DISPOSABLE) ×4
GOWN STRL REUS W/TWL XL LVL3 (GOWN DISPOSABLE) ×2
KIT MARKER MARGIN INK (KITS) ×2 IMPLANT
LIQUID BAND (GAUZE/BANDAGES/DRESSINGS) ×2 IMPLANT
NDL SAFETY ECLIPSE 18X1.5 (NEEDLE) IMPLANT
NEEDLE HYPO 18GX1.5 SHARP (NEEDLE)
NEEDLE HYPO 25X1 1.5 SAFETY (NEEDLE) ×6 IMPLANT
NS IRRIG 1000ML POUR BTL (IV SOLUTION) ×2 IMPLANT
PACK BASIN DAY SURGERY FS (CUSTOM PROCEDURE TRAY) ×2 IMPLANT
PENCIL BUTTON HOLSTER BLD 10FT (ELECTRODE) ×2 IMPLANT
SLEEVE SCD COMPRESS KNEE MED (MISCELLANEOUS) ×2 IMPLANT
SPONGE LAP 18X18 X RAY DECT (DISPOSABLE) ×2 IMPLANT
SUT MON AB 4-0 PC3 18 (SUTURE) ×4 IMPLANT
SUT SILK 2 0 SH (SUTURE) IMPLANT
SUT VICRYL 3-0 CR8 SH (SUTURE) ×2 IMPLANT
SYR CONTROL 10ML LL (SYRINGE) ×6 IMPLANT
TOWEL OR 17X24 6PK STRL BLUE (TOWEL DISPOSABLE) ×2 IMPLANT
TOWEL OR NON WOVEN STRL DISP B (DISPOSABLE) ×2 IMPLANT
TUBE CONNECTING 20X1/4 (TUBING) IMPLANT
YANKAUER SUCT BULB TIP NO VENT (SUCTIONS) IMPLANT

## 2015-05-25 NOTE — Discharge Instructions (Signed)

## 2015-05-25 NOTE — Op Note (Addendum)
05/25/2015  10:29 AM  PATIENT:  Rhonda Gardner  59 y.o. female  PRE-OPERATIVE DIAGNOSIS:  Left Breast Cancer  POST-OPERATIVE DIAGNOSIS:  Left Breast Cancer  PROCEDURE:  Procedure(s): LEFT BREAST LUMPECTOMY WITH RADIOACTIVE SEED AND SENTINEL LYMPH NODE BIOPSY (Left)  SURGEON:  Surgeon(s) and Role:    * Jovita Kussmaul, MD - Primary  PHYSICIAN ASSISTANT:   ASSISTANTS: none   ANESTHESIA:   general  EBL:  Total I/O In: 1000 [I.V.:1000] Out: -   BLOOD ADMINISTERED:none  DRAINS: none   LOCAL MEDICATIONS USED:  MARCAINE     SPECIMEN:  Source of Specimen:  left breast tissue and sentinel node with additional superior margin  DISPOSITION OF SPECIMEN:  PATHOLOGY  COUNTS:  YES  TOURNIQUET:  * No tourniquets in log *  DICTATION: .Dragon Dictation  After informed consent was obtained the patient was brought to the operating room and placed in the supine position on the operating room table. After adequate induction of general anesthesia the patient's left chest, breast, and axillary area were prepped with ChloraPrep, allowed to dry, and draped in usual sterile manner. The patient has undergone neoadjuvant therapy for a cancer in the outer aspect of the left breast. Recently the patient had an I-125 seed placed in the outer aspect of the left breast to mark the area of invasive cancer. Earlier in the day the patient also underwent injection of 1 mCi of technetium sulfur colloid in the subareolar position on the left. At this point, 2 mL of methylene blue and 3 mL of injectable saline were also injected in the subareolar position on the left. The breast was massaged for several minutes. The neoprobe was set to technetium and the left axilla was examined. An area of increased radioactivity was identified in the left axilla. The area was infiltrated with quarter percent Marcaine. A small transversely oriented incision was made with a 15 blade knife overlying the radioactivity. The  incision was carried through the skin and subcutaneous anus tissue sharply with electrocautery until the axilla was entered. Using the neoprobe to direct blunt hemostat dissection we were able to identify a hot blue lymph node. This lymph node was excised sharply with electrocautery and the lymphatics were controlled with clips. Ex vivo counts on this node were approximately 300. There were no other hot, blue, or palpable lymph nodes in the left axilla. The deep layer was then closed with interrupted 3-0 Vicryl stitches. The skin was then closed with a running 4-0 Monocryl subcuticular stitch. Attention was then turned to the left breast. The neoprobe was set to I-125. The radioactive seed was localized in the outer aspect of the left breast. An elliptical incision was made in the skin overlying the area of radioactivity with a 15 blade knife. The incision was carried through the skin and subcutaneous tissue sharply with electrocautery. While checking the area of radioactivity frequently with the neoprobe and a circular portion of breast tissue was excised sharply around the radioactive seed. The dissection was carried all the way to the chest wall. once the specimen was removed it was oriented with the appropriate paint colors. The radioactive seed was confirmed in the specimen and there was no residual I-125 radioactivity in the breast. A specimen radiograph was obtained that showed the clip and seed to be near the center of the specimen but the clip was closer to the superior margin. Additional superior margin was taken and marked with a stitch on the new true surgical margin.  The specimen was then sent pathology for further evaluation. Hemostasis was achieved using the Bovie electrocautery. The wound was irrigated with saline and infiltrated with quarter percent Marcaine. The deep layer of the wound was closed with layers of interrupted 3-0 Vicryl stitches. The skin was then closed with interrupted 4-0 Monocryl  subcuticular stitches. Dermabond dressings were applied. The patient tolerated the procedure well. At the end of the case all needle sponge and instrument counts were correct. The patient was then awakened and taken to recovery in stable condition.  PLAN OF CARE: Discharge to home after PACU  PATIENT DISPOSITION:  PACU - hemodynamically stable.   Delay start of Pharmacological VTE agent (>24hrs) due to surgical blood loss or risk of bleeding: not applicable

## 2015-05-25 NOTE — Anesthesia Postprocedure Evaluation (Signed)
  Anesthesia Post-op Note  Patient: Rhonda Gardner  Procedure(s) Performed: Procedure(s): LEFT BREAST LUMPECTOMY WITH RADIOACTIVE SEED AND SENTINEL LYMPH NODE BIOPSY (Left)  Patient Location: PACU  Anesthesia Type: General, Regional   Level of Consciousness: awake, alert  and oriented  Airway and Oxygen Therapy: Patient Spontanous Breathing  Post-op Pain: none  Post-op Assessment: Post-op Vital signs reviewed  Post-op Vital Signs: Reviewed  Last Vitals:  Filed Vitals:   05/25/15 1045  BP: 119/69  Pulse: 75  Temp:   Resp: 12    Complications: No apparent anesthesia complications

## 2015-05-25 NOTE — Anesthesia Procedure Notes (Addendum)
Anesthesia Regional Block:  Pectoralis block  Pre-Anesthetic Checklist: ,, timeout performed, Correct Patient, Correct Site, Correct Laterality, Correct Procedure, Correct Position, site marked, Risks and benefits discussed,  Surgical consent,  Pre-op evaluation,  At surgeon's request and post-op pain management  Laterality: Left and Upper  Prep: chloraprep       Needles:  Injection technique: Single-shot  Needle Type: Echogenic Needle     Needle Length: 9cm 9 cm Needle Gauge: 21 and 21 G    Additional Needles:  Procedures: ultrasound guided (picture in chart) Pectoralis block Narrative:  Start time: 05/25/2015 8:58 AM End time: 05/25/2015 9:04 AM Injection made incrementally with aspirations every 5 mL.  Performed by: Personally  Anesthesiologist: CREWS, DAVID   Procedure Name: LMA Insertion Date/Time: 05/25/2015 9:21 AM Performed by: Marrianne Mood Pre-anesthesia Checklist: Patient identified, Emergency Drugs available, Suction available, Patient being monitored and Timeout performed Patient Re-evaluated:Patient Re-evaluated prior to inductionOxygen Delivery Method: Circle System Utilized Preoxygenation: Pre-oxygenation with 100% oxygen Intubation Type: IV induction Ventilation: Mask ventilation without difficulty LMA: LMA inserted LMA Size: 4.0 Number of attempts: 1 Airway Equipment and Method: Bite block Placement Confirmation: positive ETCO2 Tube secured with: Tape Dental Injury: Teeth and Oropharynx as per pre-operative assessment       Left upper chest in R lateral position for PEC block.

## 2015-05-25 NOTE — Transfer of Care (Signed)
Immediate Anesthesia Transfer of Care Note  Patient: Rhonda Gardner  Procedure(s) Performed: Procedure(s): LEFT BREAST LUMPECTOMY WITH RADIOACTIVE SEED AND SENTINEL LYMPH NODE BIOPSY (Left)  Patient Location: PACU  Anesthesia Type:GA combined with regional for post-op pain  Level of Consciousness: sedated  Airway & Oxygen Therapy: Patient Spontanous Breathing and Patient connected to face mask oxygen  Post-op Assessment: Report given to RN and Post -op Vital signs reviewed and stable  Post vital signs: Reviewed and stable  Last Vitals:  Filed Vitals:   05/25/15 0910  BP:   Pulse: 88  Temp:   Resp: 18    Complications: No apparent anesthesia complications

## 2015-05-25 NOTE — Anesthesia Preprocedure Evaluation (Signed)
Anesthesia Evaluation  Patient identified by MRN, date of birth, ID band Patient awake    Reviewed: Allergy & Precautions, NPO status , Patient's Chart, lab work & pertinent test results  History of Anesthesia Complications (+) PONV  Airway Mallampati: I  TM Distance: >3 FB Neck ROM: Full    Dental  (+) Teeth Intact, Dental Advisory Given   Pulmonary  breath sounds clear to auscultation        Cardiovascular Rhythm:Regular Rate:Normal     Neuro/Psych    GI/Hepatic   Endo/Other    Renal/GU      Musculoskeletal   Abdominal   Peds  Hematology   Anesthesia Other Findings POPNV even with use of Scope patch  Reproductive/Obstetrics                             Anesthesia Physical Anesthesia Plan  ASA: II  Anesthesia Plan: General and Regional   Post-op Pain Management:    Induction: Intravenous  Airway Management Planned: LMA  Additional Equipment:   Intra-op Plan:   Post-operative Plan: Extubation in OR  Informed Consent: I have reviewed the patients History and Physical, chart, labs and discussed the procedure including the risks, benefits and alternatives for the proposed anesthesia with the patient or authorized representative who has indicated his/her understanding and acceptance.   Dental advisory given  Plan Discussed with: CRNA, Anesthesiologist and Surgeon  Anesthesia Plan Comments:         Anesthesia Quick Evaluation

## 2015-05-25 NOTE — H&P (Signed)
Rhonda Gardner Rhonda Gardner 03/07/2015 9:14 AM Location: [facility] Patient #: [id number] DOB: 24-Jan-1956 Married / Language: English / Race: White Female  History of Present Illness Sammuel Hines. Marlou Starks MD; 03/07/2015 10:03 AM) Patient words: recheck breast.  The patient is a 59 year old female who presents for a follow-up for Breast cancer. The patient had a 2.5 cm cancer in the upper outer quadrant of the left breast. She was ER and PR negative and HER-2 positive. She has been receiving neoadjuvant chemotherapy and has been tolerating it well. The cancer was initially sitting on the pectoralis muscle. She has no complaints today. She will finish her chemotherapy in about 8 weeks.   Other Problems Elbert Ewings, CMA; 03/07/2015 9:15 AM) Lump In Breast  Past Surgical History Elbert Ewings, CMA; 03/07/2015 9:15 AM) Breast Biopsy Bilateral. multiple Oral Surgery  Diagnostic Studies History Elbert Ewings, CMA; 03/07/2015 9:15 AM) Colonoscopy 1-5 years ago Mammogram within last year  Allergies Elbert Ewings, CMA; 03/07/2015 9:15 AM) Adhesive Tape Rash.  Medication History Elbert Ewings, CMA; 03/07/2015 9:19 AM) Vitamin D (2000UNIT Capsule, Oral) Active. Questran (4GM Packet, Oral) Active. Vitamin B12 (3000MCG/ML Liquid, Sublingual) Active. Decadron (4MG Tablet, Oral) Active. Diflucan (100MG Tablet, Oral) Active. Lidocaine (Anorectal) (5% Cream, External) Active. Loperamide A-D (2MG Tablet, Oral) Active. LORazepam (0.5MG Tablet, Oral) Active. Omeprazole (40MG Capsule DR, Oral) Active. Compazine (10MG Tablet, Oral) Active. Tobramycin (0.3% Solution, Ophthalmic) Active. ValACYclovir HCl (500MG Tablet, Oral two times daily) Active. Medications Reconciled  Social History Elbert Ewings, [place]; 03/07/2015 9:15 AM) Alcohol use Remotely quit alcohol use. Caffeine use Carbonated beverages, Coffee. No drug use Tobacco use Never smoker.  Family History Elbert Ewings, [place];  03/07/2015 9:15 AM) Arthritis Family Members In General, Mother. Colon Cancer Mother. Hypertension Family Members In General, Mother. Respiratory Condition Family Members In General, Mother.  Pregnancy / Birth History Elbert Ewings, CMA; 03/07/2015 9:15 AM) Age at menarche 46 years, 46 years. Age of menopause 51-55 Contraceptive History Contraceptive implant, Intrauterine device, Oral contraceptives. Gravida 1 Maternal age 1-25 Para 0  Review of Systems Elbert Ewings CMA; 03/07/2015 9:15 AM) General Not Present- Appetite Loss, Chills, Fatigue, Fever, Night Sweats, Weight Gain and Weight Loss. Skin Not Present- Change in Wart/Mole, Dryness, Hives, Jaundice, New Lesions, Non-Healing Wounds, Rash and Ulcer. HEENT Not Present- Earache, Hearing Loss, Hoarseness, Nose Bleed, Oral Ulcers, Ringing in the Ears, Seasonal Allergies, Sinus Pain, Sore Throat, Visual Disturbances, Wears glasses/contact lenses and Yellow Eyes. Breast Present- Breast Mass. Not Present- Breast Pain, Nipple Discharge and Skin Changes. Cardiovascular Not Present- Chest Pain, Difficulty Breathing Lying Down, Leg Cramps, Palpitations, Rapid Heart Rate, Shortness of Breath and Swelling of Extremities. Gastrointestinal Not Present- Abdominal Pain, Bloating, Bloody Stool, Change in Bowel Habits, Chronic diarrhea, Constipation, Difficulty Swallowing, Excessive gas, Gets full quickly at meals, Hemorrhoids, Indigestion, Nausea, Rectal Pain and Vomiting. Female Genitourinary Not Present- Frequency, Nocturia, Painful Urination, Pelvic Pain and Urgency. Musculoskeletal Not Present- Back Pain, Joint Pain, Joint Stiffness, Muscle Pain, Muscle Weakness and Swelling of Extremities. Neurological Not Present- Decreased Memory, Fainting, Headaches, Numbness, Seizures, Tingling, Tremor, Trouble walking and Weakness. Psychiatric Present- Bipolar. Not Present- Anxiety, Change in Sleep Pattern, Depression, Fearful and Frequent  crying. Endocrine Not Present- Cold Intolerance, Excessive Hunger, Hair Changes, Heat Intolerance, Hot flashes and New Diabetes. Hematology Not Present- Easy Bruising, Excessive bleeding, Gland problems, HIV and Persistent Infections.   Vitals Elbert Ewings CMA; 03/07/2015 9:20 AM) 03/07/2015 9:19 AM Weight: 135 lb Height: 68in Body Surface Area: 1.71 m Body Mass  Index: 20.53 kg/m Temp.: 98.63F(Oral)  Pulse: 98 (Regular)  BP: 120/76 (Sitting, Left Arm, Standard)    Physical Exam Eddie Dibbles S. Marlou Starks MD; 03/07/2015 10:03 AM) General Mental Status-Alert. General Appearance-Consistent with stated age. Hydration-Well hydrated. Voice-Normal.  Head and Neck Head-normocephalic, atraumatic with no lesions or palpable masses. Trachea-midline. Thyroid Gland Characteristics - normal size and consistency.  Eye Eyeball - Bilateral-Extraocular movements intact. Sclera/Conjunctiva - Bilateral-No scleral icterus.  Chest and Lung Exam Chest and lung exam reveals -quiet, even and easy respiratory effort with no use of accessory muscles and on auscultation, normal breath sounds, no adventitious sounds and normal vocal resonance. Inspection Chest Wall - Normal. Back - normal.  Breast Note: There is no distinct palpable mass in the left breast. There are a couple small multiple soft lymph nodes palpable in the left axilla.   Cardiovascular Cardiovascular examination reveals -normal heart sounds, regular rate and rhythm with no murmurs and normal pedal pulses bilaterally.  Abdomen Inspection Inspection of the abdomen reveals - No Hernias. Skin - Scar - no surgical scars. Palpation/Percussion Palpation and Percussion of the abdomen reveal - Soft, Non Tender, No Rebound tenderness, No Rigidity (guarding) and No hepatosplenomegaly. Auscultation Auscultation of the abdomen reveals - Bowel sounds normal.  Neurologic Neurologic evaluation reveals -alert and oriented  x 3 with no impairment of recent or remote memory. Mental Status-Normal.  Musculoskeletal Normal Exam - Left-Upper Extremity Strength Normal and Lower Extremity Strength Normal. Normal Exam - Right-Upper Extremity Strength Normal and Lower Extremity Strength Normal.  Lymphatic Head & Neck  General Head & Neck Lymphatics: Bilateral - Description - Normal. Axillary  General Axillary Region: Bilateral - Description - Normal. Tenderness - Non Tender. Femoral & Inguinal  Generalized Femoral & Inguinal Lymphatics: Bilateral - Description - Normal. Tenderness - Non Tender.    Assessment & Plan Eddie Dibbles S. Marlou Starks MD; 03/07/2015 10:02 AM) PRIMARY CANCER OF UPPER OUTER QUADRANT OF LEFT FEMALE BREAST (174.4  C50.412) Impression: The patient had a 2.5 cm cancer in the upper outer quadrant of the left breast that was very close to the pectoralis muscle. She has been receiving neoadjuvant chemotherapy and has been tolerating it well. It is difficult to palpate the cancer at this point. She will continue with her chemotherapy regimen. I will plan to see her back in about 6 weeks. She is favoring breast conservation and sentinel node mapping. Current Plans  Follow up in 6 weeks or as needed Pt Education - Breast cancer: discussed with patient and provided information.   Signed by Luella Cook, MD (03/07/2015 10:03 AM)

## 2015-05-25 NOTE — Interval H&P Note (Signed)
History and Physical Interval Note:  05/25/2015 7:08 AM  Rhonda Gardner  has presented today for surgery, with the diagnosis of Left Breast Cancer  The various methods of treatment have been discussed with the patient and family. After consideration of risks, benefits and other options for treatment, the patient has consented to  Procedure(s): LEFT BREAST LUMPECTOMY WITH RADIOACTIVE SEED AND SENTINEL LYMPH NODE BIOPSY (Left) as a surgical intervention .  [name] history has been reviewed, patient examined, no change in status, stable for surgery.  I have reviewed the patient's chart and labs.  Questions were answered to the patient's satisfaction.     TOTH III,Annisa Mazzarella S

## 2015-05-25 NOTE — Progress Notes (Signed)
Assisted Dr. Crews with left, ultrasound guided, pectoralis block. Side rails up, monitors on throughout procedure. See vital signs in flow sheet. Tolerated Procedure well. 

## 2015-05-26 ENCOUNTER — Encounter (HOSPITAL_BASED_OUTPATIENT_CLINIC_OR_DEPARTMENT_OTHER): Payer: Self-pay | Admitting: General Surgery

## 2015-05-30 ENCOUNTER — Telehealth: Payer: Self-pay | Admitting: *Deleted

## 2015-05-30 NOTE — Telephone Encounter (Signed)
This RN returned call to pt and obtained identified VM for pt.  Message left informing pt - radiation does not start until at the earliest 2 weeks post surgical.  Pt is scheduled for follow up with med onc 8/29- but referral for appt with Dr Pablo Ledger will be placed.  This RN's name and return number given for return call if needed.

## 2015-05-30 NOTE — Telephone Encounter (Signed)
VM message received @ 12:12 pm. Pt states she had had her surgery as of 05/25/15 and is inquiring about when she will be starting her radiation. Please advise

## 2015-05-31 ENCOUNTER — Telehealth: Payer: Self-pay

## 2015-05-31 NOTE — Telephone Encounter (Signed)
Vm from pt stating she had some questions about her radiation tx, and also about getting a flu shot. Message forwarded to Dr. Landis Gandy nurse.

## 2015-05-31 NOTE — Telephone Encounter (Signed)
Returned pt call.  Let her know she would be provided information on radiation start and frequency when she has her consult with Dr. Pablo Ledger.  Pt does not have an appt.  Let pt know I would send request for an appt with Dr. Pablo Ledger.  Let ptk now it was ok to have a flu shot - no live vaccines.  Pt voiced understanding.

## 2015-06-12 ENCOUNTER — Encounter: Payer: Self-pay | Admitting: *Deleted

## 2015-06-12 ENCOUNTER — Encounter: Payer: Self-pay | Admitting: Nurse Practitioner

## 2015-06-12 ENCOUNTER — Ambulatory Visit (HOSPITAL_BASED_OUTPATIENT_CLINIC_OR_DEPARTMENT_OTHER): Payer: BLUE CROSS/BLUE SHIELD

## 2015-06-12 ENCOUNTER — Other Ambulatory Visit (HOSPITAL_BASED_OUTPATIENT_CLINIC_OR_DEPARTMENT_OTHER): Payer: BLUE CROSS/BLUE SHIELD

## 2015-06-12 ENCOUNTER — Ambulatory Visit (HOSPITAL_BASED_OUTPATIENT_CLINIC_OR_DEPARTMENT_OTHER): Payer: BLUE CROSS/BLUE SHIELD | Admitting: Nurse Practitioner

## 2015-06-12 ENCOUNTER — Other Ambulatory Visit: Payer: Self-pay | Admitting: Oncology

## 2015-06-12 VITALS — BP 132/68 | HR 87 | Temp 98.6°F | Resp 18 | Wt 139.3 lb

## 2015-06-12 DIAGNOSIS — Z5112 Encounter for antineoplastic immunotherapy: Secondary | ICD-10-CM | POA: Diagnosis not present

## 2015-06-12 DIAGNOSIS — G620 Drug-induced polyneuropathy: Secondary | ICD-10-CM

## 2015-06-12 DIAGNOSIS — K521 Toxic gastroenteritis and colitis: Secondary | ICD-10-CM

## 2015-06-12 DIAGNOSIS — G4700 Insomnia, unspecified: Secondary | ICD-10-CM

## 2015-06-12 DIAGNOSIS — J069 Acute upper respiratory infection, unspecified: Secondary | ICD-10-CM

## 2015-06-12 DIAGNOSIS — M25559 Pain in unspecified hip: Secondary | ICD-10-CM | POA: Insufficient documentation

## 2015-06-12 DIAGNOSIS — H538 Other visual disturbances: Secondary | ICD-10-CM

## 2015-06-12 DIAGNOSIS — C50412 Malignant neoplasm of upper-outer quadrant of left female breast: Secondary | ICD-10-CM | POA: Diagnosis not present

## 2015-06-12 DIAGNOSIS — T451X5A Adverse effect of antineoplastic and immunosuppressive drugs, initial encounter: Secondary | ICD-10-CM

## 2015-06-12 DIAGNOSIS — M2550 Pain in unspecified joint: Secondary | ICD-10-CM | POA: Diagnosis not present

## 2015-06-12 DIAGNOSIS — R232 Flushing: Secondary | ICD-10-CM

## 2015-06-12 DIAGNOSIS — Z91048 Other nonmedicinal substance allergy status: Secondary | ICD-10-CM

## 2015-06-12 DIAGNOSIS — K1231 Oral mucositis (ulcerative) due to antineoplastic therapy: Secondary | ICD-10-CM

## 2015-06-12 DIAGNOSIS — Z171 Estrogen receptor negative status [ER-]: Secondary | ICD-10-CM | POA: Diagnosis not present

## 2015-06-12 DIAGNOSIS — M25552 Pain in left hip: Secondary | ICD-10-CM

## 2015-06-12 LAB — CBC WITH DIFFERENTIAL/PLATELET
BASO%: 1.4 % (ref 0.0–2.0)
BASOS ABS: 0.1 10*3/uL (ref 0.0–0.1)
EOS%: 4.1 % (ref 0.0–7.0)
Eosinophils Absolute: 0.2 10*3/uL (ref 0.0–0.5)
HEMATOCRIT: 34.8 % (ref 34.8–46.6)
HEMOGLOBIN: 11.5 g/dL — AB (ref 11.6–15.9)
LYMPH#: 1.1 10*3/uL (ref 0.9–3.3)
LYMPH%: 20.7 % (ref 14.0–49.7)
MCH: 34.3 pg — ABNORMAL HIGH (ref 25.1–34.0)
MCHC: 33 g/dL (ref 31.5–36.0)
MCV: 103.8 fL — ABNORMAL HIGH (ref 79.5–101.0)
MONO#: 0.5 10*3/uL (ref 0.1–0.9)
MONO%: 9 % (ref 0.0–14.0)
NEUT#: 3.3 10*3/uL (ref 1.5–6.5)
NEUT%: 64.8 % (ref 38.4–76.8)
Platelets: 239 10*3/uL (ref 145–400)
RBC: 3.35 10*6/uL — ABNORMAL LOW (ref 3.70–5.45)
RDW: 14.3 % (ref 11.2–14.5)
WBC: 5.1 10*3/uL (ref 3.9–10.3)

## 2015-06-12 LAB — COMPREHENSIVE METABOLIC PANEL (CC13)
ALBUMIN: 4.1 g/dL (ref 3.5–5.0)
ALT: 22 U/L (ref 0–55)
AST: 21 U/L (ref 5–34)
Alkaline Phosphatase: 87 U/L (ref 40–150)
Anion Gap: 7 mEq/L (ref 3–11)
BUN: 19.6 mg/dL (ref 7.0–26.0)
CO2: 27 mEq/L (ref 22–29)
Calcium: 9.1 mg/dL (ref 8.4–10.4)
Chloride: 108 mEq/L (ref 98–109)
Creatinine: 0.9 mg/dL (ref 0.6–1.1)
EGFR: 66 mL/min/{1.73_m2} — ABNORMAL LOW (ref >90)
Glucose: 89 mg/dl (ref 70–140)
Potassium: 4.6 mEq/L (ref 3.5–5.1)
Sodium: 142 mEq/L (ref 136–145)
Total Bilirubin: 0.36 mg/dL (ref 0.20–1.20)
Total Protein: 6.5 g/dL (ref 6.4–8.3)

## 2015-06-12 MED ORDER — DIPHENHYDRAMINE HCL 25 MG PO CAPS
25.0000 mg | ORAL_CAPSULE | Freq: Once | ORAL | Status: AC
  Administered 2015-06-12: 25 mg via ORAL

## 2015-06-12 MED ORDER — HEPARIN SOD (PORK) LOCK FLUSH 100 UNIT/ML IV SOLN
500.0000 [IU] | Freq: Once | INTRAVENOUS | Status: AC | PRN
  Administered 2015-06-12: 500 [IU]
  Filled 2015-06-12: qty 5

## 2015-06-12 MED ORDER — DIPHENHYDRAMINE HCL 25 MG PO CAPS
ORAL_CAPSULE | ORAL | Status: AC
  Filled 2015-06-12: qty 1

## 2015-06-12 MED ORDER — ACETAMINOPHEN 325 MG PO TABS
650.0000 mg | ORAL_TABLET | Freq: Once | ORAL | Status: AC
  Administered 2015-06-12: 650 mg via ORAL

## 2015-06-12 MED ORDER — ACETAMINOPHEN 325 MG PO TABS
ORAL_TABLET | ORAL | Status: AC
  Filled 2015-06-12: qty 2

## 2015-06-12 MED ORDER — SODIUM CHLORIDE 0.9 % IV SOLN
Freq: Once | INTRAVENOUS | Status: AC
  Administered 2015-06-12: 14:00:00 via INTRAVENOUS

## 2015-06-12 MED ORDER — SODIUM CHLORIDE 0.9 % IJ SOLN
10.0000 mL | INTRAMUSCULAR | Status: DC | PRN
  Administered 2015-06-12: 10 mL
  Filled 2015-06-12: qty 10

## 2015-06-12 MED ORDER — TRASTUZUMAB CHEMO INJECTION 440 MG
6.0000 mg/kg | Freq: Once | INTRAVENOUS | Status: AC
  Administered 2015-06-12: 399 mg via INTRAVENOUS
  Filled 2015-06-12: qty 19

## 2015-06-12 NOTE — Progress Notes (Signed)
[facility]  [phone] Fax:([phone]) [phone]     ID: Jaquanda Wickersham Cox-Fishman DOB: 05/22/1956  MR#: [id number]  JYN#:[id number]  Patient Care Team: Rita Ohara, MD as PCP - General (Family Medicine) Chauncey Cruel, MD as Consulting Physician (Oncology) Thea Silversmith, MD as Consulting Physician (Radiation Oncology) Mauro Kaufmann, RN as Registered Nurse Rockwell Germany, RN as Registered Nurse Holley Bouche, NP as Nurse Practitioner (Nurse Practitioner) Autumn Messing III, MD as Consulting Physician (General Surgery) PCP: Vikki Ports, MD OTHER MD:  CHIEF COMPLAINT: Triple negative breast cancer  CURRENT TREATMENT:  neoadjuvant chemotherapy and anti-HER-2 immunotherapy   BREAST CANCER HISTORY: From the original intake note:  "Rhonda Gardner" had not had a physical exam in quite some time, and had not had a mammogram since 2020, when she was evaluated by Dr. Tomi Bamberger 12/01/2014. Dr. Tomi Bamberger was able to palpate a 2 cm firm mass at the 3:00 position in the left breast. The patient herself has been aware of multiple breast masses and does have a history of fibrocystic change, with multiple prior benign biopsies. She did not feel this particular mass had changed recently.   Dr. Tomi Bamberger arranged for bilateral diagnostic mammography and left breast ultrasonography at the [facility] Center 12/15/2014. This shows the breast density to be category C. In the left breast upper outer quadrant there was a bilobed mass measuring approximately 2 cm. This was palpable. Ultrasound confirmed an irregular microlobulated and hypoechoic mass measuring 2.1 cm at the 2:00 location 6 cm from the nipple. There was no left axillary adenopathy.  Biopsy of the mass in question 12/19/2014 showed ([id number]) invasive ductal carcinoma, grade 2 or 3, estrogen and progesterone receptor negative, with an MIB-1 of 38%, and equivocal HER-2 amplification, the signals ratio being 1.57, the number per cell 4.25.  On  12/27/2014 the patient underwent bilateral breast MRI. The mass in question at the 2:00 location of the left breast measured 2.5 cm. It directly abuts the left pectoralis major. There is no enhancement in the muscle itself. There were no abnormal appearing lymph nodes. There was an incidentally noted 1 cm hepatic cyst at the dome of the right liver lobe.  Her subsequent history is as detailed below  INTERVAL HISTORY: Rhonda Gardner returns today for follow up of her breast cancer, accompanied by her husband, [name]. Today she is due for her next dose of trasuzumab. The interval history is remarkable for a left lumpectomy and lymph node biopsy on 05/25/15. She managed this procedure remarkably well. She was in very little pain, and did not need any of her narcotics. Her range of motion is good, but she does have some numbness to the inner left arm.   REVIEW OF SYSTEMS: Rhonda Gardner denies fevers, chills, nausea, vomiting, or changes in bowel or bladder habits. She is battling some chronic sinus symptoms with zyrtec and sudafed PRN. She does have some residual fatigue since the completion of chemotherapy. Her main complaint to day is sudden joint pain to both elbows and left hip. She noticed it after surgery and it comes on suddenly. The pain is fairly intense. She is using aleve and drinking tart cherry juice. This is somewhat helpful. She sits on an inflatable donut and this helps her left hip. If that is allowed to hurt for too long the pain starts up in her left knee too. She denies shortness of breath, chest pain, cough, or palpitations. She has no headaches, dizziness, or vision changes. A detailed review of systems  is otherwise stable.  PAST MEDICAL HISTORY: Past Medical History  Diagnosis Date  . Allergy 2006    chemical cauterization in spring 2006, and 2nd treatment with good results.(Dr.Shoemaker)  . Bipolar disorder   . Breast cancer 12/2014    invasive ductal carcinoma (LEFT)  . Breast cancer of upper-outer  quadrant of left female breast 12/22/2014  . PONV (postoperative nausea and vomiting)     PAST SURGICAL HISTORY: Past Surgical History  Procedure Laterality Date  . Breast biopsy Bilateral     benign and a right stereotatic breast biopsy.  . Foot surgery Left 2014    Dr. Paulla Dolly  . Fibroid excision  age 45    prolapsed fibroid removed  . Radioactive seed guided mastectomy with axillary sentinel lymph node biopsy Left 05/25/2015    Procedure: LEFT BREAST LUMPECTOMY WITH RADIOACTIVE SEED AND SENTINEL LYMPH NODE BIOPSY;  Surgeon: Autumn Messing III, MD;  Location: [facility];  Service: General;  Laterality: Left;    FAMILY HISTORY Family History  Problem Relation Age of Onset  . Cancer Father     skin - non melanoma - farmer  . Heart disease Father   . Hypertension Mother   . Arthritis Mother     osteoarthritis  . Colon cancer Mother   . Macular degeneration Mother     wet and dry  . Cancer Mother 79    colon cancer at age 58  . Osteoporosis Sister     osteopenia  . Cancer Maternal Aunt     lung cancer  . Diabetes Paternal Aunt   . Cancer Paternal Aunt 69    ? stomach cancer  . Autism Sister   . Mental retardation Sister   . Blindness Sister     secondary to retrolental fibroplasia  . Goiter Paternal Aunt    the patient's father lived to be 95. He had a history of nonmelanoma skin cancers. [name] mother is living at age 24. She was diagnosed with colon cancer in her 7s. A paternal aunt may have had stomach cancer. Maternal aunt developed lung cancer in her 27s. The patient has one brother, 2 sisters. One of her sisters is blind and mentally retarded. There is no history of breast or ovarian cancer in the family.  GYNECOLOGIC HISTORY:  No LMP recorded. Patient is postmenopausal. Menarche age 60. The patient is GX P0. She stopped having periods in her early 58s. She did not take hormone replacement. She took oral contraceptives remotely for over 5 years with no  complications.  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 Her husband [name] worked in IT originally but now runs a family firm. It's just the 2 of them at home, plus a chocolate lab.    ADVANCED DIRECTIVES: In place   HEALTH MAINTENANCE: Social History  Substance Use Topics  . Smoking status: Never Smoker   . Smokeless tobacco: Never Used  . Alcohol Use: No     Colonoscopy: In [place] under Dr. Lavina Hamman, date not entirely clear  PAP: February 2016  Bone density: Under [facility], results not currently available  Lipid panel:  Allergies  Allergen Reactions  . Adhesive [Tape] Rash    Pt is sensitive to any kind of adhesive tape products.    Current Outpatient Prescriptions  Medication Sig Dispense Refill  . Cholecalciferol (VITAMIN D) 2000 UNITS tablet Take 2,000 Units by mouth daily.    . Cyanocobalamin (VITAMIN B-12) 5000 MCG SUBL Place 5,000 mcg under  the tongue daily.    Marland Kitchen LAMICTAL 200 MG tablet Take 0.5 tablets (100 mg total) by mouth daily. 90 tablet 0  . lidocaine-prilocaine (EMLA) cream Apply to affected area once 30 g 3  . LUTEIN-ZEAXANTHIN PO Take 1 tablet by mouth daily.    . Misc Natural Products (TART CHERRY ADVANCED) CAPS Take 1 capsule by mouth daily.    . Multiple Vitamins-Minerals (PRESERVISION AREDS) CAPS Take 1 capsule by mouth 2 (two) times daily.    . naproxen sodium (ANAPROX) 220 MG tablet Take 220-440 mg by mouth daily as needed (pain). Aleve    . oxyCODONE-acetaminophen (ROXICET) 5-325 MG per tablet Take 1-2 tablets by mouth every 4 (four) hours as needed. 50 tablet 0   No current facility-administered medications for this visit.    OBJECTIVE: Middle-aged white woman in no acute distress Filed Vitals:   06/12/15 1313  BP: 132/68  Pulse: 87  Temp: 98.6 F (37 C)  Resp: 18     Body mass index is 21.19 kg/(m^2).    ECOG FS:1 - Symptomatic but completely ambulatory   Skin: warm, dry  HEENT: sclerae anicteric, conjunctivae pink,  oropharynx clear. No thrush or mucositis.  Lymph Nodes: No cervical or supraclavicular lymphadenopathy  Lungs: clear to auscultation bilaterally, no rales, wheezes, or rhonci  Heart: regular rate and rhythm  Abdomen: round, soft, non tender, positive bowel sounds  Musculoskeletal: No focal spinal tenderness, no peripheral edema  Neuro: non focal, well oriented, positive affect  Breasts: deferred   LAB RESULTS:  CMP     Component Value Date/Time   NA 142 06/12/2015 1245   NA 142 01/13/2015 0822   K 4.6 06/12/2015 1245   K 3.8 01/13/2015 0822   CL 106 01/13/2015 0822   CO2 27 06/12/2015 1245   CO2 26 01/13/2015 0822   GLUCOSE 89 06/12/2015 1245   GLUCOSE 83 01/13/2015 0822   BUN 19.6 06/12/2015 1245   BUN 8 01/13/2015 0822   CREATININE 0.9 06/12/2015 1245   CREATININE 0.77 01/13/2015 0822   CREATININE 0.80 12/01/2014 1437   CALCIUM 9.1 06/12/2015 1245   CALCIUM 9.1 01/13/2015 0822   PROT 6.5 06/12/2015 1245   PROT 7.0 12/01/2014 1437   ALBUMIN 4.1 06/12/2015 1245   ALBUMIN 4.8 12/01/2014 1437   AST 21 06/12/2015 1245   AST 28 12/01/2014 1437   ALT 22 06/12/2015 1245   ALT 24 12/01/2014 1437   ALKPHOS 87 06/12/2015 1245   ALKPHOS 65 12/01/2014 1437   BILITOT 0.36 06/12/2015 1245   BILITOT 0.6 12/01/2014 1437   GFRNONAA 89* 01/13/2015 0822   GFRAA >90 01/13/2015 0822    INo results found for: SPEP, UPEP  Lab Results  Component Value Date   WBC 5.1 06/12/2015   NEUTROABS 3.3 06/12/2015   HGB 11.5* 06/12/2015   HCT 34.8 06/12/2015   MCV 103.8* 06/12/2015   PLT 239 06/12/2015      Chemistry      Component Value Date/Time   NA 142 06/12/2015 1245   NA 142 01/13/2015 0822   K 4.6 06/12/2015 1245   K 3.8 01/13/2015 0822   CL 106 01/13/2015 0822   CO2 27 06/12/2015 1245   CO2 26 01/13/2015 0822   BUN 19.6 06/12/2015 1245   BUN 8 01/13/2015 0822   CREATININE 0.9 06/12/2015 1245   CREATININE 0.77 01/13/2015 0822   CREATININE 0.80 12/01/2014 1437        Component Value Date/Time   CALCIUM 9.1 06/12/2015 1245  CALCIUM 9.1 01/13/2015 0822   ALKPHOS 87 06/12/2015 1245   ALKPHOS 65 12/01/2014 1437   AST 21 06/12/2015 1245   AST 28 12/01/2014 1437   ALT 22 06/12/2015 1245   ALT 24 12/01/2014 1437   BILITOT 0.36 06/12/2015 1245   BILITOT 0.6 12/01/2014 1437       No results found for: LABCA2  No components found for: [id number]  No results for input(s): INR in the last 168 hours.  Urinalysis    Component Value Date/Time   LABSPEC 1.005 04/03/2015 1451   LABSPEC 1.010 08/09/2007 2121   PHURINE 7.5 04/03/2015 1451   PHURINE 7.5 08/09/2007 2121   GLUCOSEU Negative 04/03/2015 1451   GLUCOSEU NEGATIVE 08/09/2007 2121   HGBUR Trace 04/03/2015 1451   HGBUR SMALL* 08/09/2007 2121   BILIRUBINUR Negative 04/03/2015 1451   BILIRUBINUR neg 12/01/2014 1358   BILIRUBINUR NEGATIVE 08/09/2007 2121   KETONESUR Negative 04/03/2015 [place] 08/09/2007 2121   PROTEINUR Negative 04/03/2015 1451   PROTEINUR neg 12/01/2014 1358   PROTEINUR NEGATIVE 08/09/2007 2121   UROBILINOGEN 0.2 04/03/2015 1451   UROBILINOGEN negative 12/01/2014 1358   UROBILINOGEN 0.2 08/09/2007 2121   NITRITE Negative 04/03/2015 1451   NITRITE neg 12/01/2014 1358   NITRITE NEGATIVE 08/09/2007 2121   LEUKOCYTESUR Small 04/03/2015 1451   LEUKOCYTESUR Negative 12/01/2014 1358    STUDIES: Mr Breast Bilateral W Wo Contrast  05/19/2015   CLINICAL DATA:  Follow-up MRI. Patient has known left breast invasive mammary carcinoma. Status post neoadjuvant treatment.  LABS:  N/A  EXAM: BILATERAL BREAST MRI WITH AND WITHOUT CONTRAST  TECHNIQUE: Multiplanar, multisequence MR images of both breasts were obtained prior to and following the intravenous administration of 12 ml of MultiHance.  THREE-DIMENSIONAL MR IMAGE RENDERING ON INDEPENDENT WORKSTATION:  Three-dimensional MR images were rendered by post-processing of the original MR data on an independent workstation.  The three-dimensional MR images were interpreted, and findings are reported in the following complete MRI report for this study. Three dimensional images were evaluated at the independent DynaCad workstation  COMPARISON:  MRI 12/27/2014 and prior imaging studies from [facility]: Breast composition: c. Heterogeneous fibroglandular tissue.  Background parenchymal enhancement: Mild  Right breast: No mass or abnormal enhancement. Right-sided Port-A-Cath is in place.  Left breast: Signal void is identified in the far lateral aspect of the left breast, in the area previously biopsied invasive mammary carcinoma. There is no significant residual enhancement in this region. No new enhancement in the left breast.  Lymph nodes: No abnormal appearing lymph nodes.  Ancillary findings: Stable hepatic cyst at the dome of the right hepatic lobe.  IMPRESSION: 1. No significant residual enhancement at the site of previously diagnosed left breast cancer. 2. No new enhancement or areas of concern.  RECOMMENDATION: Treatment plan is recommended.  BI-RADS CATEGORY  6: Known biopsy-proven malignancy.   Electronically Signed   By: Nolon Nations M.D.   On: 05/19/2015 09:02   Nm Sentinel Node Inj-no Rpt (breast)  05/25/2015   CLINICAL DATA: left breast cancer   Sulfur colloid was injected intradermally by the nuclear medicine  technologist for breast cancer sentinel node localization.    Mm Breast Surgical Specimen  05/25/2015   CLINICAL DATA:  Left lumpectomy for breast cancer.  EXAM: SPECIMEN RADIOGRAPH OF THE LEFT BREAST  COMPARISON:  Previous exam(s).  FINDINGS: Status post excision of the left breast. The radioactive seed and biopsy marker clip are present, completely intact,  and were marked for pathology.  IMPRESSION: Specimen radiograph of the left breast.   Electronically Signed   By: Claudie Revering M.D.   On: 05/25/2015 10:25   Mm Lt Radioactive Seed Loc Mammo Guide  05/23/2015    CLINICAL DATA:  Patient with left breast cancer status post neoadjuvant chemotherapy for preoperative localization.  EXAM: MAMMOGRAPHIC GUIDED RADIOACTIVE SEED LOCALIZATION OF THE LEFT BREAST  COMPARISON:  Previous exam(s).  FINDINGS: Patient presents for radioactive seed localization prior to left breast lumpectomy. I met with the patient and we discussed the procedure of seed localization including benefits and alternatives. We discussed the high likelihood of a successful procedure. We discussed the risks of the procedure including infection, bleeding, tissue injury and further surgery. We discussed the low dose of radioactivity involved in the procedure. Informed, written consent was given.  The usual time-out protocol was performed immediately prior to the procedure.  Using mammographic guidance, sterile technique, 2% lidocaine and an I-125 radioactive seed, coil shaped biopsy marking clip was localized using a lateral approach. The follow-up mammogram images confirm the seed in the expected location and were marked for Dr. Brantley Stage.  Follow-up survey of the patient confirms presence of the radioactive seed.  Order number of I-125 seed:  [id number].  Total activity:  0.245 mCi  Reference Date: 05/19/2015  The patient tolerated the procedure well and was released from the [facility]. She was given instructions regarding seed removal.  IMPRESSION: Radioactive seed localization left breast. No apparent complications.   Electronically Signed   By: Lovey Newcomer M.D.   On: 05/23/2015 17:31    ASSESSMENT: 59 y.o. [place] woman s/p left breast upper outer quadrant biopsy 12/19/2014 for a clinical T2 N0, stage IIA invasive ductal carcinoma, grade 2 or 3, estrogen and progesterone receptor negative, with an MIB-1 of 38%, HER-2 equivocal initially but positive on further testing with a ratio of 3.4  (1) neoadjuvant chemotherapy to consist of carboplatin, docetaxel, trastuzumab and pertuzumab given every 3 weeks  for 6 cycles with Neulasta support--started 01/16/2015  (a) docetaxel switched for gemcitabine for final 2 cycles because of persistent neuropathy.  (2) status post left lumpectomy and lymph node biopsy final pathological stage ypTis(LCIS), ypN0  (3) radiation to follow surgery  (4) genetics counselor felt the risk of a familial mutation was sufficiently low testing could be omitted  PLAN: Rhonda Gardner looks and feels well today. The labs were reviewed in detail and were stable. She will proceed with her next dose of trastuzumab as planned today. I have placed orders for a repeat echocardiogram in late September.   Dr. Jana Hakim was present for the duration of the second half of the visit. He reviewed the results of her pathology report, which showed a complete pathological response, which is favorable in terms of her risk of recurrent disease.   He believes her joint pain is likely the residual effect of chemotherapy. He suggests exercises such as Tai Chi, or yoga to find improvement in this symptom. He also suggests she have a baseline visit with physical therapy, to learn some stretching and massage techniques to her left arm. She will also need a sleeve for long flight travel.   Rhonda Gardner will meet with Dr. Pablo Ledger later this week to discuss radiation therapy. She will continue trastuzumab every 3 weeks. Her next office visit will be in 3 months, but we can see her sooner if necessary. She understands and agrees with this plan. She knows the goal of treatment in her  case is cure. She has been encouraged to call with any issues that might arise before her next visit here.   Laurie Panda, NP   06/12/2015 2:04 PM    ADDENDUM: I met with the patient and her husband chiefly to clarify her final pathology report. She did obtain a complete pathologic response-- no residual invasive tumor in breast or nodes. LCIS is not a cancer, more a marker of cancer risk.  We also discussed the fact that patients  who attain a CPR have a markedly better prognosis stage by stage than those who don't.  She is continuing trastuzumab, and will be scheduled to meet with radiation oncolopgy soon to complete her local therapy..  I personally saw this patient and performed a substantive portion of this encounter with the listed APP documented above.   Chauncey Cruel, MD Medical Oncology and Hematology [facility] [address] [place], [place] [address] Tel. [phone]    Fax. [phone]

## 2015-06-12 NOTE — Patient Instructions (Signed)
[facility] Discharge Instructions for Patients Receiving Chemotherapy  Today you received the following chemotherapy agents Herceptin  To help prevent nausea and vomiting after your treatment, we encourage you to take your nausea medication as needed   If you develop nausea and vomiting that is not controlled by your nausea medication, call the clinic.   BELOW ARE SYMPTOMS THAT SHOULD BE REPORTED IMMEDIATELY:  *FEVER GREATER THAN 100.5 F  *CHILLS WITH OR WITHOUT FEVER  NAUSEA AND VOMITING THAT IS NOT CONTROLLED WITH YOUR NAUSEA MEDICATION  *UNUSUAL SHORTNESS OF BREATH  *UNUSUAL BRUISING OR BLEEDING  TENDERNESS IN MOUTH AND THROAT WITH OR WITHOUT PRESENCE OF ULCERS  *URINARY PROBLEMS  *BOWEL PROBLEMS  UNUSUAL RASH Items with * indicate a potential emergency and should be followed up as soon as possible.  Feel free to call the clinic you have any questions or concerns. The clinic phone number is [phone].  Please show the CHEMO ALERT CARD at check-in to the Emergency Department and triage nurse.   

## 2015-06-14 NOTE — Progress Notes (Signed)
Location of Breast Cancer:Left upper-outer breast 2.5 cm  Histology per Pathology Report:05/25/2015  FINAL DIAGNOSIS Diagnosis 1. Lymph node, sentinel, biopsy, left axilla #1 - THERE IS NO EVIDENCE OF CARCINOMA IN 1 OF 1 LYMPH NODE (0/1). 2. Breast, lumpectomy, left - LOBULAR NEOPLASIA (LOBULAR CARCINOMA IN SITU). - TREATMENT RELATED CHANGES. - THERE IS NO EVIDENCE OF DUCTAL CARCINOMA. - SEE COMMENT. 3. Breast, excision, left additional superior margin - BENIGN BREAST PARENCHYMA. - THERE IS NO EVIDENCE OF MALIGNANCY. - SEE COMMENT. 4. Lymph node, sentinel, biopsy, left axilla, #2 - THERE IS NO EVIDENCE OF CARCINOMA IN 1 OF 1 LYMPH NODE (0/1).  Receptor Status: ER(-), PR (-), Her2-neu (-)  Did patient present with symptoms (if so, please note symptoms) or was this found on screening mammography?:  Past/Anticipated interventions by surgeon, if any:05/25/15 [place]  Past/Anticipated interventions by medical oncology, if any: Chemotherapy:docetaxel switched for gemcitabine with neulasta support completed.Now on trastuzumab.  Lymphedema issues, if any: No  Pain issues, if OVF:IEPPI achiness.  SAFETY ISSUES:No  Prior radiation?   Pacemaker/ICD? No  Possible current pregnancy?No  Is the patient on methotrexate?No   Current Complaints / other details:Married. Menarche 54.No children.Last menstrual cycle in mid 50's.No hormonal therapy. No family history of breast or female cancer.    Arlyss Repress, RN 06/14/2015,10:21 AM

## 2015-06-15 ENCOUNTER — Ambulatory Visit
Admission: RE | Admit: 2015-06-15 | Discharge: 2015-06-15 | Disposition: A | Discharge status: Home / Self Care | Payer: BLUE CROSS/BLUE SHIELD | Source: Ambulatory Visit | Attending: Radiation Oncology | Admitting: Radiation Oncology

## 2015-06-15 ENCOUNTER — Encounter: Payer: Self-pay | Admitting: Radiation Oncology

## 2015-06-15 VITALS — BP 115/77 | HR 81 | Temp 98.3°F | Wt 136.8 lb

## 2015-06-15 DIAGNOSIS — C50412 Malignant neoplasm of upper-outer quadrant of left female breast: Secondary | ICD-10-CM | POA: Diagnosis present

## 2015-06-15 DIAGNOSIS — Z510 Encounter for antineoplastic radiation therapy: Secondary | ICD-10-CM | POA: Diagnosis not present

## 2015-06-15 NOTE — Progress Notes (Signed)
   Department of Radiation Oncology  Phone:  [phone] Fax:        [phone]   Name: Rhonda Gardner MRN: [id number]  DOB: 10/13/1956  Date: 06/15/2015  Follow Up Visit Note  Diagnosis: Breast cancer of upper-outer quadrant of left female breast   Staging form: Breast, AJCC 7th Edition     Clinical stage from 12/28/2014: Stage IIA (T2, N0, M0) - Unsigned       Staging comments: Staged at breast conference on 3.16.16  Interval History: Tymia presents today for routine followup.  She had done well. The patient completed chemotherapy on 05/01/15. She had a left breast lumpectomy 05/25/15 and that showed no residual disease. She will continue receiving infusions of Herceptin.   Physical Exam:  Filed Vitals:   06/15/15 0921  BP: 115/77  Pulse: 81  Temp: 98.3 F (36.8 C)  Weight: 136 lb 12.8 oz (62.052 kg)  Alert and oriented x 3. In no distress.  IMPRESSION: Ell[place]e is a 60 y.o. female with ypTxN0 invasive ductal carcinoma of the left breast  PLAN: I spoke to the patient today regarding her diagnosis and options for treatment. We discussed the equivalence in terms of survival and local failure between mastectomy and breast conservation. We discussed the role of radiation in decreasing local failures in patients who undergo lumpectomy. We discussed the process of simulation and the placement tattoos. We discussed 4-6 weeks of treatment as an outpatient. We discussed the possibility of asymptomatic lung damage. We discussed the low likelihood of secondary malignancies. We discussed the possible side effects including but not limited to skin redness, fatigue, permanent skin darkening, and breast swelling.  We discussed the use of cardiac sparing with deep inspiration breath hold if needed.  We will schedule CT simulation on September 6 at 11AM and she will begin radiation treatment the following week. The patient signed a consent form and this was placed in her medical  chart.  This document serves as a record of services personally performed by Thea Silversmith, MD. It was created on her behalf by Darcus Austin, a trained medical scribe. The creation of this record is based on the scribe's personal observations and the provider's statements to them. This document has been checked and approved by the attending provider.   Thea Silversmith, MD

## 2015-06-15 NOTE — Progress Notes (Signed)
Please see the Nurse Progress Note in the MD Initial Consult Encounter for this patient. 

## 2015-06-20 ENCOUNTER — Ambulatory Visit
Admission: RE | Admit: 2015-06-20 | Discharge: 2015-06-20 | Disposition: A | Discharge status: Home / Self Care | Payer: BLUE CROSS/BLUE SHIELD | Source: Ambulatory Visit | Attending: Radiation Oncology | Admitting: Radiation Oncology

## 2015-06-20 DIAGNOSIS — Z510 Encounter for antineoplastic radiation therapy: Secondary | ICD-10-CM | POA: Diagnosis not present

## 2015-06-20 DIAGNOSIS — C50412 Malignant neoplasm of upper-outer quadrant of left female breast: Secondary | ICD-10-CM

## 2015-06-20 NOTE — Progress Notes (Signed)
Name: Rhonda Gardner   MRN: [id number]  Date:  06/20/2015  DOB: Jun 23, 1956  Status:outpatient   DIAGNOSIS: Left Breast cancer.  CONSENT VERIFIED: yes SET UP: Patient is setup supine  IMMOBILIZATION:  The following immobilization was used:Custom Moldable Pillow, breast board.  NARRATIVE: [name] was brought to the [facility].  Identity was confirmed.  All relevant records and images related to the planned course of therapy were reviewed.  Then, the patient was positioned in a stable reproducible clinical set-up for radiation therapy.  Wires were placed to delineate the clinical extent of breast tissue. A wire was placed on the scar as well.  CT images were obtained.  An isocenter was placed. Skin markings were placed.  The position of the heart was then analyzed.  Due to the proximity of the heart to the chest wall, I felt she would benefit from deep inspiration breath hold for cardiac sparing.  She was then coached and rescanned in the breath hold position.  Acceptable cardiac sparing was achieved. The CT images were loaded into the planning software where the target and avoidance structures were contoured.  The radiation prescription was entered and confirmed. The patient was discharged in stable condition and tolerated simulation well.    TREATMENT PLANNING NOTE/3D Simulation Note Treatment planning then occurred. I have requested : MLC's, isodose plan, basic dose calculation  3D simulation was performed.  I personally designed and supervised the construction of 3 medically necessary complex treatment devices in the form of MLCs which will be used for beam modification and to protect critical structures including the heart and lung as well as the immobilization device which is necessary for reproducible set up.  I have requested a dose volume histogram of the heart, lung and tumor cavity.   Special treatment procedure was performed today due to the extra time and  effort required by myself to plan and prepare this patient for deep inspiration breath hold technique.  I have determined cardiac sparing to be of benefit to this patient to prevent long term cardiac damage due to radiation of the heart.  Bellows were placed on the patient's abdomen. To facilitate cardiac sparing, the patient was coached by the radiation therapists on breath hold techniques and breathing practice was performed. Practice waveforms were obtained. The patient was then scanned while maintaining breath hold in the treatment position.  This image was then transferred over to the imaging specialist. The imaging specialist then created a fusion of the free breathing and breath hold scans using the chest wall as the stable structure. I personally reviewed the fusion in axial, coronal and sagittal image planes.  Excellent cardiac sparing was obtained.  I felt the patient is an appropriate candidate for breath hold and the patient will be treated as such.  The image fusion was then reviewed with the patient to reinforce the necessity of reproducible breath hold.

## 2015-06-22 DIAGNOSIS — Z510 Encounter for antineoplastic radiation therapy: Secondary | ICD-10-CM | POA: Diagnosis not present

## 2015-06-27 ENCOUNTER — Ambulatory Visit
Admission: RE | Admit: 2015-06-27 | Discharge: 2015-06-27 | Disposition: A | Discharge status: Home / Self Care | Payer: BLUE CROSS/BLUE SHIELD | Source: Ambulatory Visit | Attending: Radiation Oncology | Admitting: Radiation Oncology

## 2015-06-27 DIAGNOSIS — Z510 Encounter for antineoplastic radiation therapy: Secondary | ICD-10-CM | POA: Diagnosis not present

## 2015-06-28 ENCOUNTER — Ambulatory Visit
Admission: RE | Admit: 2015-06-28 | Discharge: 2015-06-28 | Disposition: A | Discharge status: Home / Self Care | Payer: BLUE CROSS/BLUE SHIELD | Source: Ambulatory Visit | Attending: Radiation Oncology | Admitting: Radiation Oncology

## 2015-06-28 DIAGNOSIS — Z510 Encounter for antineoplastic radiation therapy: Secondary | ICD-10-CM | POA: Diagnosis not present

## 2015-06-29 ENCOUNTER — Ambulatory Visit
Admission: RE | Admit: 2015-06-29 | Discharge: 2015-06-29 | Disposition: A | Discharge status: Home / Self Care | Payer: BLUE CROSS/BLUE SHIELD | Source: Ambulatory Visit | Attending: Radiation Oncology | Admitting: Radiation Oncology

## 2015-06-29 ENCOUNTER — Encounter: Payer: Self-pay | Admitting: Radiation Oncology

## 2015-06-29 DIAGNOSIS — Z510 Encounter for antineoplastic radiation therapy: Secondary | ICD-10-CM | POA: Diagnosis not present

## 2015-06-29 DIAGNOSIS — C50412 Malignant neoplasm of upper-outer quadrant of left female breast: Secondary | ICD-10-CM

## 2015-06-29 MED ORDER — ALRA NON-METALLIC DEODORANT (RAD-ONC)
1.0000 "application " | Freq: Once | TOPICAL | Status: AC
  Administered 2015-06-29: 1 via TOPICAL

## 2015-06-29 MED ORDER — RADIAPLEXRX EX GEL
Freq: Once | CUTANEOUS | Status: AC
  Administered 2015-06-29: 18:00:00 via TOPICAL

## 2015-06-29 NOTE — Progress Notes (Signed)
Weekly Management Note Current Dose: 3.6  Gy  Projected Dose: 61 Gy   Narrative:  The patient presents for routine under treatment assessment.  CBCT/MVCT images/Port film x-rays were reviewed.  The chart was checked. Doing well. No complaints.   Physical Findings: Weight:  . Unchanged  Impression:  The patient is tolerating radiation.  Plan:  Continue treatment as planned. RN education performed.

## 2015-06-29 NOTE — Progress Notes (Signed)
Pt here for patient teaching.  Pt given Radiation and You booklet, skin care instructions, Alra deodorant and Radiaplex gel. Pt reports they have watched the Radiation Therapy Education video on June 29, 2015.  Reviewed areas of pertinence such as  . Pt able to give teach back of to pat skin, use unscented/gentle soap and drink plenty of water,apply Radiaplex bid, avoid applying anything to skin within 4 hours of treatment, avoid wearing an under wire bra and to use an electric razor if they must shave. Pt no evidence of learning and refused teaching of information given and will contact nursing with any questions or concerns.    Pt states they have not watched the Radiation Therapy Education.  On June 29, 2015 video presented and watched. [email or web address]/index

## 2015-06-30 ENCOUNTER — Ambulatory Visit
Admission: RE | Admit: 2015-06-30 | Discharge: 2015-06-30 | Disposition: A | Discharge status: Home / Self Care | Payer: BLUE CROSS/BLUE SHIELD | Source: Ambulatory Visit | Attending: Radiation Oncology | Admitting: Radiation Oncology

## 2015-06-30 ENCOUNTER — Ambulatory Visit: Payer: BLUE CROSS/BLUE SHIELD

## 2015-06-30 DIAGNOSIS — Z510 Encounter for antineoplastic radiation therapy: Secondary | ICD-10-CM | POA: Diagnosis not present

## 2015-07-03 ENCOUNTER — Ambulatory Visit (HOSPITAL_BASED_OUTPATIENT_CLINIC_OR_DEPARTMENT_OTHER): Payer: BLUE CROSS/BLUE SHIELD

## 2015-07-03 ENCOUNTER — Telehealth: Payer: Self-pay | Admitting: *Deleted

## 2015-07-03 ENCOUNTER — Other Ambulatory Visit (HOSPITAL_BASED_OUTPATIENT_CLINIC_OR_DEPARTMENT_OTHER): Payer: BLUE CROSS/BLUE SHIELD

## 2015-07-03 ENCOUNTER — Ambulatory Visit (HOSPITAL_COMMUNITY): Payer: BLUE CROSS/BLUE SHIELD | Attending: Nurse Practitioner

## 2015-07-03 ENCOUNTER — Other Ambulatory Visit: Payer: Self-pay

## 2015-07-03 ENCOUNTER — Ambulatory Visit
Admission: RE | Admit: 2015-07-03 | Discharge: 2015-07-03 | Disposition: A | Discharge status: Home / Self Care | Payer: BLUE CROSS/BLUE SHIELD | Source: Ambulatory Visit | Attending: Radiation Oncology | Admitting: Radiation Oncology

## 2015-07-03 DIAGNOSIS — Z5112 Encounter for antineoplastic immunotherapy: Secondary | ICD-10-CM

## 2015-07-03 DIAGNOSIS — Z510 Encounter for antineoplastic radiation therapy: Secondary | ICD-10-CM | POA: Diagnosis not present

## 2015-07-03 DIAGNOSIS — C50412 Malignant neoplasm of upper-outer quadrant of left female breast: Secondary | ICD-10-CM

## 2015-07-03 DIAGNOSIS — I071 Rheumatic tricuspid insufficiency: Secondary | ICD-10-CM | POA: Diagnosis not present

## 2015-07-03 DIAGNOSIS — I340 Nonrheumatic mitral (valve) insufficiency: Secondary | ICD-10-CM | POA: Diagnosis not present

## 2015-07-03 LAB — CBC WITH DIFFERENTIAL/PLATELET
BASO%: 1.2 % (ref 0.0–2.0)
BASOS ABS: 0.1 10*3/uL (ref 0.0–0.1)
EOS%: 3.5 % (ref 0.0–7.0)
Eosinophils Absolute: 0.1 10*3/uL (ref 0.0–0.5)
HEMATOCRIT: 36.4 % (ref 34.8–46.6)
HEMOGLOBIN: 12.1 g/dL (ref 11.6–15.9)
LYMPH#: 0.8 10*3/uL — AB (ref 0.9–3.3)
LYMPH%: 18.4 % (ref 14.0–49.7)
MCH: 33.6 pg (ref 25.1–34.0)
MCHC: 33.3 g/dL (ref 31.5–36.0)
MCV: 100.9 fL (ref 79.5–101.0)
MONO#: 0.4 10*3/uL (ref 0.1–0.9)
MONO%: 8.6 % (ref 0.0–14.0)
NEUT%: 68.3 % (ref 38.4–76.8)
NEUTROS ABS: 2.8 10*3/uL (ref 1.5–6.5)
Platelets: 189 10*3/uL (ref 145–400)
RBC: 3.6 10*6/uL — ABNORMAL LOW (ref 3.70–5.45)
RDW: 13.1 % (ref 11.2–14.5)
WBC: 4.2 10*3/uL (ref 3.9–10.3)

## 2015-07-03 LAB — COMPREHENSIVE METABOLIC PANEL (CC13)
ALBUMIN: 4.3 g/dL (ref 3.5–5.0)
ALK PHOS: 77 U/L (ref 40–150)
ALT: 21 U/L (ref 0–55)
AST: 24 U/L (ref 5–34)
Anion Gap: 7 mEq/L (ref 3–11)
BUN: 18.7 mg/dL (ref 7.0–26.0)
CALCIUM: 9 mg/dL (ref 8.4–10.4)
CO2: 28 mEq/L (ref 22–29)
CREATININE: 0.8 mg/dL (ref 0.6–1.1)
Chloride: 107 mEq/L (ref 98–109)
EGFR: 77 mL/min/{1.73_m2} — ABNORMAL LOW (ref >90)
GLUCOSE: 88 mg/dL (ref 70–140)
POTASSIUM: 3.8 meq/L (ref 3.5–5.1)
SODIUM: 142 meq/L (ref 136–145)
Total Bilirubin: 0.5 mg/dL (ref 0.20–1.20)
Total Protein: 6.5 g/dL (ref 6.4–8.3)

## 2015-07-03 MED ORDER — ACETAMINOPHEN 325 MG PO TABS
650.0000 mg | ORAL_TABLET | Freq: Once | ORAL | Status: AC
  Administered 2015-07-03: 650 mg via ORAL

## 2015-07-03 MED ORDER — SODIUM CHLORIDE 0.9 % IJ SOLN
10.0000 mL | INTRAMUSCULAR | Status: DC | PRN
  Administered 2015-07-03: 10 mL
  Filled 2015-07-03: qty 10

## 2015-07-03 MED ORDER — SODIUM CHLORIDE 0.9 % IV SOLN
Freq: Once | INTRAVENOUS | Status: AC
  Administered 2015-07-03: 12:00:00 via INTRAVENOUS

## 2015-07-03 MED ORDER — DIPHENHYDRAMINE HCL 25 MG PO CAPS
25.0000 mg | ORAL_CAPSULE | Freq: Once | ORAL | Status: AC
  Administered 2015-07-03: 25 mg via ORAL

## 2015-07-03 MED ORDER — DIPHENHYDRAMINE HCL 25 MG PO CAPS
ORAL_CAPSULE | ORAL | Status: AC
  Filled 2015-07-03: qty 1

## 2015-07-03 MED ORDER — SODIUM CHLORIDE 0.9 % IV SOLN
6.0000 mg/kg | Freq: Once | INTRAVENOUS | Status: AC
  Administered 2015-07-03: 399 mg via INTRAVENOUS
  Filled 2015-07-03: qty 19

## 2015-07-03 MED ORDER — ACETAMINOPHEN 325 MG PO TABS
ORAL_TABLET | ORAL | Status: AC
  Filled 2015-07-03: qty 2

## 2015-07-03 MED ORDER — HEPARIN SOD (PORK) LOCK FLUSH 100 UNIT/ML IV SOLN
500.0000 [IU] | Freq: Once | INTRAVENOUS | Status: AC | PRN
  Administered 2015-07-03: 500 [IU]
  Filled 2015-07-03: qty 5

## 2015-07-03 NOTE — Telephone Encounter (Signed)
Left vm requesting pt to return call to assess needs during xrt. Contact information given.

## 2015-07-03 NOTE — Patient Instructions (Signed)
[facility] Discharge Instructions for Patients Receiving Chemotherapy  Today you received the following chemotherapy agents: Herceptin   To help prevent nausea and vomiting after your treatment, we encourage you to take your nausea medication as directed.    If you develop nausea and vomiting that is not controlled by your nausea medication, call the clinic.   BELOW ARE SYMPTOMS THAT SHOULD BE REPORTED IMMEDIATELY:  *FEVER GREATER THAN 100.5 F  *CHILLS WITH OR WITHOUT FEVER  NAUSEA AND VOMITING THAT IS NOT CONTROLLED WITH YOUR NAUSEA MEDICATION  *UNUSUAL SHORTNESS OF BREATH  *UNUSUAL BRUISING OR BLEEDING  TENDERNESS IN MOUTH AND THROAT WITH OR WITHOUT PRESENCE OF ULCERS  *URINARY PROBLEMS  *BOWEL PROBLEMS  UNUSUAL RASH Items with * indicate a potential emergency and should be followed up as soon as possible.  Feel free to call the clinic you have any questions or concerns. The clinic phone number is [phone].  Please show the CHEMO ALERT CARD at check-in to the Emergency Department and triage nurse.   

## 2015-07-04 ENCOUNTER — Encounter: Payer: Self-pay | Admitting: Radiation Oncology

## 2015-07-04 ENCOUNTER — Ambulatory Visit
Admission: RE | Admit: 2015-07-04 | Discharge: 2015-07-04 | Disposition: A | Discharge status: Home / Self Care | Payer: BLUE CROSS/BLUE SHIELD | Source: Ambulatory Visit | Attending: Radiation Oncology | Admitting: Radiation Oncology

## 2015-07-04 VITALS — BP 127/52 | HR 83 | Resp 16 | Wt 140.9 lb

## 2015-07-04 DIAGNOSIS — Z510 Encounter for antineoplastic radiation therapy: Secondary | ICD-10-CM | POA: Diagnosis not present

## 2015-07-04 DIAGNOSIS — C50412 Malignant neoplasm of upper-outer quadrant of left female breast: Secondary | ICD-10-CM

## 2015-07-04 NOTE — Progress Notes (Signed)
Weight and vitals stable. Denies pain. Faint hyperpigmentation without desquamation with left breast treatment field. Reports she has received radiaplex gel but, hasn't began to use it yet. Denies fatigue. Patient smiling and giddy.   BP 127/52 mmHg  Pulse 83  Resp 16  Wt 140 lb 14.4 oz (63.912 kg) Wt Readings from Last 3 Encounters:  07/04/15 140 lb 14.4 oz (63.912 kg)  06/15/15 136 lb 12.8 oz (62.052 kg)  06/12/15 139 lb 4.8 oz (63.186 kg)

## 2015-07-04 NOTE — Progress Notes (Signed)
Weekly Management Note Current Dose: 9  Gy  Projected Dose: 61 Gy   Narrative:  The patient presents for routine under treatment assessment.  CBCT/MVCT images/Port film x-rays were reviewed.  The chart was checked. Doing well. No complaints.   Physical Findings: Weight: 140 lb 14.4 oz (63.912 kg). Unchanged  Impression:  The patient is tolerating radiation.  Plan:  Continue treatment as planned.

## 2015-07-05 ENCOUNTER — Ambulatory Visit
Admission: RE | Admit: 2015-07-05 | Discharge: 2015-07-05 | Disposition: A | Discharge status: Home / Self Care | Payer: BLUE CROSS/BLUE SHIELD | Source: Ambulatory Visit | Attending: Radiation Oncology | Admitting: Radiation Oncology

## 2015-07-05 DIAGNOSIS — Z510 Encounter for antineoplastic radiation therapy: Secondary | ICD-10-CM | POA: Diagnosis not present

## 2015-07-06 ENCOUNTER — Ambulatory Visit
Admission: RE | Admit: 2015-07-06 | Discharge: 2015-07-06 | Disposition: A | Discharge status: Home / Self Care | Payer: BLUE CROSS/BLUE SHIELD | Source: Ambulatory Visit | Attending: Radiation Oncology | Admitting: Radiation Oncology

## 2015-07-06 DIAGNOSIS — Z510 Encounter for antineoplastic radiation therapy: Secondary | ICD-10-CM | POA: Diagnosis not present

## 2015-07-07 ENCOUNTER — Ambulatory Visit
Admission: RE | Admit: 2015-07-07 | Discharge: 2015-07-07 | Disposition: A | Discharge status: Home / Self Care | Payer: BLUE CROSS/BLUE SHIELD | Source: Ambulatory Visit | Attending: Radiation Oncology | Admitting: Radiation Oncology

## 2015-07-07 DIAGNOSIS — Z510 Encounter for antineoplastic radiation therapy: Secondary | ICD-10-CM | POA: Diagnosis not present

## 2015-07-10 ENCOUNTER — Ambulatory Visit
Admission: RE | Admit: 2015-07-10 | Discharge: 2015-07-10 | Disposition: A | Discharge status: Home / Self Care | Payer: BLUE CROSS/BLUE SHIELD | Source: Ambulatory Visit | Attending: Radiation Oncology | Admitting: Radiation Oncology

## 2015-07-10 DIAGNOSIS — Z510 Encounter for antineoplastic radiation therapy: Secondary | ICD-10-CM | POA: Diagnosis not present

## 2015-07-11 ENCOUNTER — Ambulatory Visit
Admission: RE | Admit: 2015-07-11 | Discharge: 2015-07-11 | Disposition: A | Discharge status: Home / Self Care | Payer: BLUE CROSS/BLUE SHIELD | Source: Ambulatory Visit | Attending: Radiation Oncology | Admitting: Radiation Oncology

## 2015-07-11 VITALS — BP 125/63 | HR 78 | Temp 98.2°F | Wt 139.4 lb

## 2015-07-11 DIAGNOSIS — Z510 Encounter for antineoplastic radiation therapy: Secondary | ICD-10-CM | POA: Diagnosis not present

## 2015-07-11 DIAGNOSIS — C50412 Malignant neoplasm of upper-outer quadrant of left female breast: Secondary | ICD-10-CM

## 2015-07-11 NOTE — Progress Notes (Signed)
Weekly Management Note Current Dose: 16  Gy  Projected Dose: 61 Gy   Narrative:  The patient presents for routine under treatment assessment.  CBCT/MVCT images/Port film x-rays were reviewed.  The chart was checked. Doing well. No complaints.   Physical Findings: Weight: 139 lb 6.4 oz (63.231 kg). Minimal pink skin.   Impression:  The patient is tolerating radiation.  Plan:  Continue treatment as planned. Continue radiaplex.

## 2015-07-11 NOTE — Progress Notes (Signed)
Weekly assessment of radiation to left breast.Completed 10 of 33 treatments.deniies pain.skin unchanged. BP 125/63 mmHg  Pulse 78  Temp(Src) 98.2 F (36.8 C)  Wt 139 lb 6.4 oz (63.231 kg)

## 2015-07-12 ENCOUNTER — Ambulatory Visit
Admission: RE | Admit: 2015-07-12 | Discharge: 2015-07-12 | Disposition: A | Discharge status: Home / Self Care | Payer: BLUE CROSS/BLUE SHIELD | Source: Ambulatory Visit | Attending: Radiation Oncology | Admitting: Radiation Oncology

## 2015-07-12 DIAGNOSIS — Z510 Encounter for antineoplastic radiation therapy: Secondary | ICD-10-CM | POA: Diagnosis not present

## 2015-07-13 ENCOUNTER — Ambulatory Visit
Admission: RE | Admit: 2015-07-13 | Discharge: 2015-07-13 | Disposition: A | Discharge status: Home / Self Care | Payer: BLUE CROSS/BLUE SHIELD | Source: Ambulatory Visit | Attending: Radiation Oncology | Admitting: Radiation Oncology

## 2015-07-13 DIAGNOSIS — Z510 Encounter for antineoplastic radiation therapy: Secondary | ICD-10-CM | POA: Diagnosis not present

## 2015-07-14 ENCOUNTER — Ambulatory Visit
Admission: RE | Admit: 2015-07-14 | Discharge: 2015-07-14 | Disposition: A | Discharge status: Home / Self Care | Payer: BLUE CROSS/BLUE SHIELD | Source: Ambulatory Visit | Attending: Radiation Oncology | Admitting: Radiation Oncology

## 2015-07-14 DIAGNOSIS — Z510 Encounter for antineoplastic radiation therapy: Secondary | ICD-10-CM | POA: Diagnosis not present

## 2015-07-17 ENCOUNTER — Ambulatory Visit
Admission: RE | Admit: 2015-07-17 | Discharge: 2015-07-17 | Disposition: A | Discharge status: Home / Self Care | Payer: BLUE CROSS/BLUE SHIELD | Source: Ambulatory Visit | Attending: Radiation Oncology | Admitting: Radiation Oncology

## 2015-07-17 DIAGNOSIS — Z510 Encounter for antineoplastic radiation therapy: Secondary | ICD-10-CM | POA: Diagnosis not present

## 2015-07-18 ENCOUNTER — Ambulatory Visit
Admission: RE | Admit: 2015-07-18 | Discharge: 2015-07-18 | Disposition: A | Discharge status: Home / Self Care | Payer: BLUE CROSS/BLUE SHIELD | Source: Ambulatory Visit | Attending: Radiation Oncology | Admitting: Radiation Oncology

## 2015-07-18 VITALS — BP 125/62 | HR 84 | Temp 97.9°F | Resp 16 | Ht 68.0 in | Wt 140.5 lb

## 2015-07-18 DIAGNOSIS — Z510 Encounter for antineoplastic radiation therapy: Secondary | ICD-10-CM | POA: Diagnosis not present

## 2015-07-18 DIAGNOSIS — C50412 Malignant neoplasm of upper-outer quadrant of left female breast: Secondary | ICD-10-CM

## 2015-07-18 NOTE — Progress Notes (Signed)
Weekly Management Note Current Dose: 27  Gy  Projected Dose: 61 Gy   Narrative:  The patient presents for routine under treatment assessment.  CBCT/MVCT images/Port film x-rays were reviewed.  The chart was checked.  Rhonda Gardner has completed 15 fractions to her left breast. She reports having joint pain, especially in her elbows. She took a 2 week course of Mobic which helped. She is not taking anything now for the pain. She reports occasional fatigue. She is using radiaplex.  Physical Findings: Weight: 140 lb 8 oz (63.73 kg). Minimal skin changes in the treatment area.  Impression:  The patient is tolerating radiation.  Plan:  Continue treatment as planned. Continue radiaplex.  This document serves as a record of services personally performed by Thea Silversmith, MD. It was created on her behalf by Darcus Austin, a trained medical scribe. The creation of this record is based on the scribe's personal observations and the provider's statements to them. This document has been checked and approved by the attending provider.

## 2015-07-18 NOTE — Progress Notes (Signed)
Rhonda Gardner has completed 15 fractions to her left breast.  She reports having joint pain, especially in her elbows.  She took a 2 week course of Mobic which helped.  She is not taking anything now for the pain.  She reports occasional fatigue.  The skin on her left breast is pink.  She is using radiaplex.  BP 125/62 mmHg  Pulse 84  Temp(Src) 97.9 F (36.6 C) (Oral)  Resp 16  Ht 5\' 8"  (1.727 m)  Wt 140 lb 8 oz (63.73 kg)  BMI 21.37 kg/m2

## 2015-07-19 ENCOUNTER — Ambulatory Visit
Admission: RE | Admit: 2015-07-19 | Discharge: 2015-07-19 | Disposition: A | Discharge status: Home / Self Care | Payer: BLUE CROSS/BLUE SHIELD | Source: Ambulatory Visit | Attending: Radiation Oncology | Admitting: Radiation Oncology

## 2015-07-19 DIAGNOSIS — Z510 Encounter for antineoplastic radiation therapy: Secondary | ICD-10-CM | POA: Diagnosis not present

## 2015-07-20 ENCOUNTER — Ambulatory Visit
Admission: RE | Admit: 2015-07-20 | Discharge: 2015-07-20 | Disposition: A | Discharge status: Home / Self Care | Payer: BLUE CROSS/BLUE SHIELD | Source: Ambulatory Visit | Attending: Radiation Oncology | Admitting: Radiation Oncology

## 2015-07-20 DIAGNOSIS — Z510 Encounter for antineoplastic radiation therapy: Secondary | ICD-10-CM | POA: Diagnosis not present

## 2015-07-21 ENCOUNTER — Ambulatory Visit
Admission: RE | Admit: 2015-07-21 | Discharge: 2015-07-21 | Disposition: A | Discharge status: Home / Self Care | Payer: BLUE CROSS/BLUE SHIELD | Source: Ambulatory Visit | Attending: Radiation Oncology | Admitting: Radiation Oncology

## 2015-07-21 DIAGNOSIS — Z510 Encounter for antineoplastic radiation therapy: Secondary | ICD-10-CM | POA: Diagnosis not present

## 2015-07-24 ENCOUNTER — Ambulatory Visit (HOSPITAL_BASED_OUTPATIENT_CLINIC_OR_DEPARTMENT_OTHER): Payer: BLUE CROSS/BLUE SHIELD

## 2015-07-24 ENCOUNTER — Ambulatory Visit
Admission: RE | Admit: 2015-07-24 | Discharge: 2015-07-24 | Disposition: A | Discharge status: Home / Self Care | Payer: BLUE CROSS/BLUE SHIELD | Source: Ambulatory Visit | Attending: Radiation Oncology | Admitting: Radiation Oncology

## 2015-07-24 ENCOUNTER — Other Ambulatory Visit (HOSPITAL_BASED_OUTPATIENT_CLINIC_OR_DEPARTMENT_OTHER): Payer: BLUE CROSS/BLUE SHIELD

## 2015-07-24 VITALS — BP 121/80 | HR 77 | Temp 97.6°F | Resp 18

## 2015-07-24 DIAGNOSIS — Z5112 Encounter for antineoplastic immunotherapy: Secondary | ICD-10-CM

## 2015-07-24 DIAGNOSIS — C50412 Malignant neoplasm of upper-outer quadrant of left female breast: Secondary | ICD-10-CM | POA: Diagnosis not present

## 2015-07-24 DIAGNOSIS — Z510 Encounter for antineoplastic radiation therapy: Secondary | ICD-10-CM | POA: Diagnosis not present

## 2015-07-24 LAB — COMPREHENSIVE METABOLIC PANEL (CC13)
ALT: 17 U/L (ref 0–55)
ANION GAP: 7 meq/L (ref 3–11)
AST: 20 U/L (ref 5–34)
Albumin: 4.1 g/dL (ref 3.5–5.0)
Alkaline Phosphatase: 77 U/L (ref 40–150)
BUN: 16.8 mg/dL (ref 7.0–26.0)
CALCIUM: 9.1 mg/dL (ref 8.4–10.4)
CHLORIDE: 104 meq/L (ref 98–109)
CO2: 30 mEq/L — ABNORMAL HIGH (ref 22–29)
Creatinine: 0.8 mg/dL (ref 0.6–1.1)
EGFR: 79 mL/min/{1.73_m2} — ABNORMAL LOW (ref >90)
Glucose: 86 mg/dl (ref 70–140)
POTASSIUM: 4.5 meq/L (ref 3.5–5.1)
Sodium: 140 mEq/L (ref 136–145)
Total Bilirubin: 0.45 mg/dL (ref 0.20–1.20)
Total Protein: 6.5 g/dL (ref 6.4–8.3)

## 2015-07-24 LAB — CBC WITH DIFFERENTIAL/PLATELET
BASO%: 0.9 % (ref 0.0–2.0)
BASOS ABS: 0 10*3/uL (ref 0.0–0.1)
EOS%: 2.8 % (ref 0.0–7.0)
Eosinophils Absolute: 0.1 10*3/uL (ref 0.0–0.5)
HEMATOCRIT: 36.4 % (ref 34.8–46.6)
HGB: 12.2 g/dL (ref 11.6–15.9)
LYMPH%: 13.1 % — AB (ref 14.0–49.7)
MCH: 32.9 pg (ref 25.1–34.0)
MCHC: 33.6 g/dL (ref 31.5–36.0)
MCV: 97.9 fL (ref 79.5–101.0)
MONO#: 0.4 10*3/uL (ref 0.1–0.9)
MONO%: 9 % (ref 0.0–14.0)
NEUT#: 3.6 10*3/uL (ref 1.5–6.5)
NEUT%: 74.2 % (ref 38.4–76.8)
PLATELETS: 196 10*3/uL (ref 145–400)
RBC: 3.71 10*6/uL (ref 3.70–5.45)
RDW: 12.8 % (ref 11.2–14.5)
WBC: 4.9 10*3/uL (ref 3.9–10.3)
lymph#: 0.6 10*3/uL — ABNORMAL LOW (ref 0.9–3.3)

## 2015-07-24 MED ORDER — DIPHENHYDRAMINE HCL 25 MG PO CAPS
ORAL_CAPSULE | ORAL | Status: AC
  Filled 2015-07-24: qty 1

## 2015-07-24 MED ORDER — ACETAMINOPHEN 325 MG PO TABS
650.0000 mg | ORAL_TABLET | Freq: Once | ORAL | Status: AC
  Administered 2015-07-24: 650 mg via ORAL

## 2015-07-24 MED ORDER — DIPHENHYDRAMINE HCL 25 MG PO CAPS
25.0000 mg | ORAL_CAPSULE | Freq: Once | ORAL | Status: AC
  Administered 2015-07-24: 25 mg via ORAL

## 2015-07-24 MED ORDER — TRASTUZUMAB CHEMO INJECTION 440 MG
6.0000 mg/kg | Freq: Once | INTRAVENOUS | Status: AC
  Administered 2015-07-24: 399 mg via INTRAVENOUS
  Filled 2015-07-24: qty 19

## 2015-07-24 MED ORDER — HEPARIN SOD (PORK) LOCK FLUSH 100 UNIT/ML IV SOLN
500.0000 [IU] | Freq: Once | INTRAVENOUS | Status: AC | PRN
  Administered 2015-07-24: 500 [IU]
  Filled 2015-07-24: qty 5

## 2015-07-24 MED ORDER — ACETAMINOPHEN 325 MG PO TABS
ORAL_TABLET | ORAL | Status: AC
  Filled 2015-07-24: qty 2

## 2015-07-24 MED ORDER — SODIUM CHLORIDE 0.9 % IJ SOLN
10.0000 mL | INTRAMUSCULAR | Status: DC | PRN
  Administered 2015-07-24: 10 mL
  Filled 2015-07-24: qty 10

## 2015-07-24 MED ORDER — SODIUM CHLORIDE 0.9 % IV SOLN
Freq: Once | INTRAVENOUS | Status: AC
  Administered 2015-07-24: 13:00:00 via INTRAVENOUS

## 2015-07-24 NOTE — Patient Instructions (Signed)
[facility] Discharge Instructions for Patients Receiving Chemotherapy  Today you received the following chemotherapy agents:  Herceptin  To help prevent nausea and vomiting after your treatment, we encourage you to take your nausea medication as prescribed.   If you develop nausea and vomiting that is not controlled by your nausea medication, call the clinic.   BELOW ARE SYMPTOMS THAT SHOULD BE REPORTED IMMEDIATELY:  *FEVER GREATER THAN 100.5 F  *CHILLS WITH OR WITHOUT FEVER  NAUSEA AND VOMITING THAT IS NOT CONTROLLED WITH YOUR NAUSEA MEDICATION  *UNUSUAL SHORTNESS OF BREATH  *UNUSUAL BRUISING OR BLEEDING  TENDERNESS IN MOUTH AND THROAT WITH OR WITHOUT PRESENCE OF ULCERS  *URINARY PROBLEMS  *BOWEL PROBLEMS  UNUSUAL RASH Items with * indicate a potential emergency and should be followed up as soon as possible.  Feel free to call the clinic you have any questions or concerns. The clinic phone number is [phone].  Please show the CHEMO ALERT CARD at check-in to the Emergency Department and triage nurse.   

## 2015-07-24 NOTE — Progress Notes (Signed)
Pt here for Herceptin.  C/O  Rash on left upper chest right above left breast where pt receives radiation.  Stated she notes rashes last Thursday after radiation.  Stated rashes itch occasionally, and mild clear drainage.  No weeping nor any wet pockets noted today.  Denied pain nor discomfort.  Instructed pt to notify radiation nurses today for evaluation of red rashes.  Pt voiced understanding.

## 2015-07-25 ENCOUNTER — Ambulatory Visit
Admission: RE | Admit: 2015-07-25 | Discharge: 2015-07-25 | Disposition: A | Discharge status: Home / Self Care | Payer: BLUE CROSS/BLUE SHIELD | Source: Ambulatory Visit | Attending: Radiation Oncology | Admitting: Radiation Oncology

## 2015-07-25 ENCOUNTER — Encounter: Payer: Self-pay | Admitting: Radiation Oncology

## 2015-07-25 ENCOUNTER — Ambulatory Visit
Admission: RE | Admit: 2015-07-25 | Payer: BLUE CROSS/BLUE SHIELD | Source: Ambulatory Visit | Admitting: Radiation Oncology

## 2015-07-25 VITALS — BP 127/72 | HR 72 | Temp 98.3°F | Wt 140.0 lb

## 2015-07-25 DIAGNOSIS — C50412 Malignant neoplasm of upper-outer quadrant of left female breast: Secondary | ICD-10-CM

## 2015-07-25 DIAGNOSIS — Z510 Encounter for antineoplastic radiation therapy: Secondary | ICD-10-CM | POA: Diagnosis not present

## 2015-07-25 NOTE — Progress Notes (Signed)
BP 127/72 mmHg  Pulse 72  Temp(Src) 98.3 F (36.8 C)  Wt 140 lb (63.504 kg)  Weekly assessment of radiation to left breast.Completed 20 of 33 treatments.Denies pain.Has rash of upper mid chest but states not itching that much.Continue application of radiaplex twice daily.

## 2015-07-25 NOTE — Progress Notes (Addendum)
Weekly Management Note Current Dose:  36 Gy  Projected Dose: 61 Gy   Narrative:  The patient presents for routine under treatment assessment.  CBCT/MVCT images/Port film x-rays were reviewed.  The chart was checked. Doing well. No complaints.   Physical Findings: Weight: 140 lb (63.504 kg). Dermatitis medially.   Impression:  The patient is tolerating radiation.  Plan:  Continue treatment as planned. Continue radiaplex. Add hydrocortisone if needed.

## 2015-07-26 ENCOUNTER — Ambulatory Visit
Admission: RE | Admit: 2015-07-26 | Discharge: 2015-07-26 | Disposition: A | Discharge status: Home / Self Care | Payer: BLUE CROSS/BLUE SHIELD | Source: Ambulatory Visit | Attending: Radiation Oncology | Admitting: Radiation Oncology

## 2015-07-26 DIAGNOSIS — Z510 Encounter for antineoplastic radiation therapy: Secondary | ICD-10-CM | POA: Diagnosis not present

## 2015-07-27 ENCOUNTER — Ambulatory Visit
Admission: RE | Admit: 2015-07-27 | Discharge: 2015-07-27 | Disposition: A | Discharge status: Home / Self Care | Payer: BLUE CROSS/BLUE SHIELD | Source: Ambulatory Visit | Attending: Radiation Oncology | Admitting: Radiation Oncology

## 2015-07-27 DIAGNOSIS — Z510 Encounter for antineoplastic radiation therapy: Secondary | ICD-10-CM | POA: Diagnosis not present

## 2015-07-28 ENCOUNTER — Ambulatory Visit
Admission: RE | Admit: 2015-07-28 | Discharge: 2015-07-28 | Disposition: A | Discharge status: Home / Self Care | Payer: BLUE CROSS/BLUE SHIELD | Source: Ambulatory Visit | Attending: Radiation Oncology | Admitting: Radiation Oncology

## 2015-07-28 DIAGNOSIS — Z510 Encounter for antineoplastic radiation therapy: Secondary | ICD-10-CM | POA: Diagnosis not present

## 2015-07-31 ENCOUNTER — Ambulatory Visit
Admission: RE | Admit: 2015-07-31 | Discharge: 2015-07-31 | Disposition: A | Discharge status: Home / Self Care | Payer: BLUE CROSS/BLUE SHIELD | Source: Ambulatory Visit | Attending: Radiation Oncology | Admitting: Radiation Oncology

## 2015-07-31 DIAGNOSIS — Z510 Encounter for antineoplastic radiation therapy: Secondary | ICD-10-CM | POA: Diagnosis not present

## 2015-08-01 ENCOUNTER — Ambulatory Visit
Admission: RE | Admit: 2015-08-01 | Discharge: 2015-08-01 | Disposition: A | Discharge status: Home / Self Care | Payer: BLUE CROSS/BLUE SHIELD | Source: Ambulatory Visit | Attending: Radiation Oncology | Admitting: Radiation Oncology

## 2015-08-01 ENCOUNTER — Ambulatory Visit
Admission: RE | Admit: 2015-08-01 | Payer: BLUE CROSS/BLUE SHIELD | Source: Ambulatory Visit | Admitting: Radiation Oncology

## 2015-08-01 DIAGNOSIS — C50412 Malignant neoplasm of upper-outer quadrant of left female breast: Secondary | ICD-10-CM

## 2015-08-01 DIAGNOSIS — Z510 Encounter for antineoplastic radiation therapy: Secondary | ICD-10-CM | POA: Diagnosis not present

## 2015-08-01 MED ORDER — RADIAPLEXRX EX GEL: Freq: Once | CUTANEOUS | Status: DC

## 2015-08-01 NOTE — Progress Notes (Signed)
Weekly Management Note Current Dose:  45 Gy  Projected Dose: 61 Gy   Narrative:  The patient presents for routine under treatment assessment.  CBCT/MVCT images/Port film x-rays were reviewed.  The chart was checked. Doing well. Itching medially.  Physical Findings: Weight:  . Dermatitis medially.   Impression:  The patient is tolerating radiation.  Plan:  Continue treatment as planned. Continue radiaplex. Add hydrocortisone if needed.

## 2015-08-02 ENCOUNTER — Ambulatory Visit
Admission: RE | Admit: 2015-08-02 | Discharge: 2015-08-02 | Disposition: A | Discharge status: Home / Self Care | Payer: BLUE CROSS/BLUE SHIELD | Source: Ambulatory Visit | Attending: Radiation Oncology | Admitting: Radiation Oncology

## 2015-08-02 DIAGNOSIS — Z510 Encounter for antineoplastic radiation therapy: Secondary | ICD-10-CM | POA: Diagnosis not present

## 2015-08-03 ENCOUNTER — Ambulatory Visit
Admission: RE | Admit: 2015-08-03 | Discharge: 2015-08-03 | Disposition: A | Discharge status: Home / Self Care | Payer: BLUE CROSS/BLUE SHIELD | Source: Ambulatory Visit | Attending: Radiation Oncology | Admitting: Radiation Oncology

## 2015-08-03 DIAGNOSIS — Z510 Encounter for antineoplastic radiation therapy: Secondary | ICD-10-CM | POA: Diagnosis not present

## 2015-08-04 ENCOUNTER — Ambulatory Visit
Admission: RE | Admit: 2015-08-04 | Discharge: 2015-08-04 | Disposition: A | Discharge status: Home / Self Care | Payer: BLUE CROSS/BLUE SHIELD | Source: Ambulatory Visit | Attending: Radiation Oncology | Admitting: Radiation Oncology

## 2015-08-04 DIAGNOSIS — Z510 Encounter for antineoplastic radiation therapy: Secondary | ICD-10-CM | POA: Diagnosis not present

## 2015-08-07 ENCOUNTER — Ambulatory Visit
Admission: RE | Admit: 2015-08-07 | Discharge: 2015-08-07 | Disposition: A | Discharge status: Home / Self Care | Payer: BLUE CROSS/BLUE SHIELD | Source: Ambulatory Visit | Attending: Radiation Oncology | Admitting: Radiation Oncology

## 2015-08-07 DIAGNOSIS — Z510 Encounter for antineoplastic radiation therapy: Secondary | ICD-10-CM | POA: Diagnosis not present

## 2015-08-08 ENCOUNTER — Ambulatory Visit
Admission: RE | Admit: 2015-08-08 | Discharge: 2015-08-08 | Disposition: A | Discharge status: Home / Self Care | Payer: BLUE CROSS/BLUE SHIELD | Source: Ambulatory Visit | Attending: Radiation Oncology | Admitting: Radiation Oncology

## 2015-08-08 ENCOUNTER — Encounter: Payer: Self-pay | Admitting: Radiation Oncology

## 2015-08-08 VITALS — BP 121/61 | HR 84 | Temp 97.8°F | Resp 20 | Wt 143.2 lb

## 2015-08-08 DIAGNOSIS — C50412 Malignant neoplasm of upper-outer quadrant of left female breast: Secondary | ICD-10-CM

## 2015-08-08 DIAGNOSIS — Z510 Encounter for antineoplastic radiation therapy: Secondary | ICD-10-CM | POA: Diagnosis not present

## 2015-08-08 MED ORDER — SONAFINE EX EMUL
1.0000 "application " | Freq: Once | CUTANEOUS | Status: AC
  Administered 2015-08-08: 1 via TOPICAL
  Filled 2015-08-08: qty 45

## 2015-08-08 MED ORDER — SONAFINE EX EMUL: 1.0000 "application " | Freq: Two times a day (BID) | CUTANEOUS | Status: DC

## 2015-08-08 NOTE — Progress Notes (Signed)
Weekly rad txs, 30/33 completed, mild erythema and  Mild rash side of left breast, using radiaplex bid,  Appetite good, energy level poor, herceptin due 08/14/15  And for 6 more months everyu 3 weeks 3:43 PM BP 121/61 mmHg  Pulse 84  Temp(Src) 97.8 F (36.6 C) (Oral)  Resp 20  Wt 143 lb 3.2 oz (64.955 kg)  Wt Readings from Last 3 Encounters:  08/08/15 143 lb 3.2 oz (64.955 kg)  07/25/15 140 lb (63.504 kg)  07/18/15 140 lb 8 oz (63.73 kg)

## 2015-08-08 NOTE — Progress Notes (Signed)
Weekly Management Note Current Dose:  55 Gy  Projected Dose: 61 Gy   Narrative:  The patient presents for routine under treatment assessment.  CBCT/MVCT images/Port film x-rays were reviewed.  The chart was checked. Doing well. Itching medially Not relieved with hydrocortisone.   Physical Findings: Weight: 143 lb 3.2 oz (64.955 kg). Dermatitis medially.   Impression:  The patient is tolerating radiation.  Plan:  Continue treatment as planned. Change to biafene.

## 2015-08-09 ENCOUNTER — Ambulatory Visit
Admission: RE | Admit: 2015-08-09 | Discharge: 2015-08-09 | Disposition: A | Discharge status: Home / Self Care | Payer: BLUE CROSS/BLUE SHIELD | Source: Ambulatory Visit | Attending: Radiation Oncology | Admitting: Radiation Oncology

## 2015-08-09 DIAGNOSIS — Z510 Encounter for antineoplastic radiation therapy: Secondary | ICD-10-CM | POA: Diagnosis not present

## 2015-08-10 ENCOUNTER — Ambulatory Visit
Admission: RE | Admit: 2015-08-10 | Discharge: 2015-08-10 | Disposition: A | Discharge status: Home / Self Care | Payer: BLUE CROSS/BLUE SHIELD | Source: Ambulatory Visit | Attending: Radiation Oncology | Admitting: Radiation Oncology

## 2015-08-10 DIAGNOSIS — Z510 Encounter for antineoplastic radiation therapy: Secondary | ICD-10-CM | POA: Diagnosis not present

## 2015-08-11 ENCOUNTER — Telehealth: Payer: Self-pay | Admitting: *Deleted

## 2015-08-11 ENCOUNTER — Ambulatory Visit
Admission: RE | Admit: 2015-08-11 | Discharge: 2015-08-11 | Disposition: A | Discharge status: Home / Self Care | Payer: BLUE CROSS/BLUE SHIELD | Source: Ambulatory Visit | Attending: Radiation Oncology | Admitting: Radiation Oncology

## 2015-08-11 ENCOUNTER — Encounter: Payer: Self-pay | Admitting: Radiation Oncology

## 2015-08-11 DIAGNOSIS — Z510 Encounter for antineoplastic radiation therapy: Secondary | ICD-10-CM | POA: Diagnosis not present

## 2015-08-11 NOTE — Telephone Encounter (Signed)
Left vm to congratulate pt on completion of xrt.  

## 2015-08-14 ENCOUNTER — Other Ambulatory Visit: Payer: Self-pay | Admitting: Oncology

## 2015-08-14 ENCOUNTER — Ambulatory Visit: Payer: BLUE CROSS/BLUE SHIELD

## 2015-08-14 ENCOUNTER — Ambulatory Visit (HOSPITAL_BASED_OUTPATIENT_CLINIC_OR_DEPARTMENT_OTHER): Payer: BLUE CROSS/BLUE SHIELD

## 2015-08-14 ENCOUNTER — Other Ambulatory Visit (HOSPITAL_BASED_OUTPATIENT_CLINIC_OR_DEPARTMENT_OTHER): Payer: BLUE CROSS/BLUE SHIELD

## 2015-08-14 DIAGNOSIS — C50412 Malignant neoplasm of upper-outer quadrant of left female breast: Secondary | ICD-10-CM

## 2015-08-14 DIAGNOSIS — Z5112 Encounter for antineoplastic immunotherapy: Secondary | ICD-10-CM

## 2015-08-14 LAB — COMPREHENSIVE METABOLIC PANEL (CC13)
ALT: 18 U/L (ref 0–55)
AST: 21 U/L (ref 5–34)
Albumin: 4.2 g/dL (ref 3.5–5.0)
Alkaline Phosphatase: 78 U/L (ref 40–150)
Anion Gap: 7 mEq/L (ref 3–11)
BILIRUBIN TOTAL: 0.41 mg/dL (ref 0.20–1.20)
BUN: 16.1 mg/dL (ref 7.0–26.0)
CHLORIDE: 104 meq/L (ref 98–109)
CO2: 30 meq/L — AB (ref 22–29)
CREATININE: 0.8 mg/dL (ref 0.6–1.1)
Calcium: 9.2 mg/dL (ref 8.4–10.4)
EGFR: 76 mL/min/{1.73_m2} — ABNORMAL LOW (ref >90)
GLUCOSE: 122 mg/dL (ref 70–140)
Potassium: 4.1 mEq/L (ref 3.5–5.1)
SODIUM: 140 meq/L (ref 136–145)
TOTAL PROTEIN: 6.8 g/dL (ref 6.4–8.3)

## 2015-08-14 LAB — CBC WITH DIFFERENTIAL/PLATELET
BASO%: 1 % (ref 0.0–2.0)
Basophils Absolute: 0 10*3/uL (ref 0.0–0.1)
EOS ABS: 0.2 10*3/uL (ref 0.0–0.5)
EOS%: 3.8 % (ref 0.0–7.0)
HCT: 38.5 % (ref 34.8–46.6)
HEMOGLOBIN: 12.8 g/dL (ref 11.6–15.9)
LYMPH%: 8.9 % — ABNORMAL LOW (ref 14.0–49.7)
MCH: 31.9 pg (ref 25.1–34.0)
MCHC: 33.3 g/dL (ref 31.5–36.0)
MCV: 95.9 fL (ref 79.5–101.0)
MONO#: 0.4 10*3/uL (ref 0.1–0.9)
MONO%: 8.4 % (ref 0.0–14.0)
NEUT%: 77.9 % — ABNORMAL HIGH (ref 38.4–76.8)
NEUTROS ABS: 3.7 10*3/uL (ref 1.5–6.5)
PLATELETS: 202 10*3/uL (ref 145–400)
RBC: 4.02 10*6/uL (ref 3.70–5.45)
RDW: 12.9 % (ref 11.2–14.5)
WBC: 4.8 10*3/uL (ref 3.9–10.3)
lymph#: 0.4 10*3/uL — ABNORMAL LOW (ref 0.9–3.3)

## 2015-08-14 MED ORDER — HEPARIN SOD (PORK) LOCK FLUSH 100 UNIT/ML IV SOLN
500.0000 [IU] | Freq: Once | INTRAVENOUS | Status: AC | PRN
  Administered 2015-08-14: 500 [IU]
  Filled 2015-08-14: qty 5

## 2015-08-14 MED ORDER — ACETAMINOPHEN 325 MG PO TABS
ORAL_TABLET | ORAL | Status: AC
  Filled 2015-08-14: qty 2

## 2015-08-14 MED ORDER — DIPHENHYDRAMINE HCL 25 MG PO CAPS
ORAL_CAPSULE | ORAL | Status: AC
  Filled 2015-08-14: qty 1

## 2015-08-14 MED ORDER — DIPHENHYDRAMINE HCL 25 MG PO CAPS
25.0000 mg | ORAL_CAPSULE | Freq: Once | ORAL | Status: AC
  Administered 2015-08-14: 25 mg via ORAL

## 2015-08-14 MED ORDER — TRASTUZUMAB CHEMO INJECTION 440 MG
6.0000 mg/kg | Freq: Once | INTRAVENOUS | Status: AC
  Administered 2015-08-14: 399 mg via INTRAVENOUS
  Filled 2015-08-14: qty 19

## 2015-08-14 MED ORDER — ACETAMINOPHEN 325 MG PO TABS
650.0000 mg | ORAL_TABLET | Freq: Once | ORAL | Status: AC
  Administered 2015-08-14: 650 mg via ORAL

## 2015-08-14 MED ORDER — SODIUM CHLORIDE 0.9 % IJ SOLN
10.0000 mL | INTRAMUSCULAR | Status: DC | PRN
  Administered 2015-08-14: 10 mL
  Filled 2015-08-14: qty 10

## 2015-08-14 MED ORDER — SODIUM CHLORIDE 0.9 % IV SOLN
Freq: Once | INTRAVENOUS | Status: AC
  Administered 2015-08-14: 12:00:00 via INTRAVENOUS

## 2015-08-14 NOTE — Patient Instructions (Signed)
[facility] Discharge Instructions for Patients Receiving Chemotherapy  Today you received the following chemotherapy agents Herceptin  To help prevent nausea and vomiting after your treatment, we encourage you to take your nausea medication    If you develop nausea and vomiting that is not controlled by your nausea medication, call the clinic.   BELOW ARE SYMPTOMS THAT SHOULD BE REPORTED IMMEDIATELY:  *FEVER GREATER THAN 100.5 F  *CHILLS WITH OR WITHOUT FEVER  NAUSEA AND VOMITING THAT IS NOT CONTROLLED WITH YOUR NAUSEA MEDICATION  *UNUSUAL SHORTNESS OF BREATH  *UNUSUAL BRUISING OR BLEEDING  TENDERNESS IN MOUTH AND THROAT WITH OR WITHOUT PRESENCE OF ULCERS  *URINARY PROBLEMS  *BOWEL PROBLEMS  UNUSUAL RASH Items with * indicate a potential emergency and should be followed up as soon as possible.  Feel free to call the clinic you have any questions or concerns. The clinic phone number is [phone].  Please show the CHEMO ALERT CARD at check-in to the Emergency Department and triage nurse.   

## 2015-08-21 NOTE — Progress Notes (Addendum)
Name: Pria Klosinski Cox-Fishman   MRN: [id number]  Date:  07/25/2015   DOB: [birth year]  Status:outpatient    DIAGNOSIS: Breast cancer of upper-outer quadrant of left female breast [facility])   Staging form: Breast, AJCC 7th Edition     Clinical stage from 12/28/2014: Stage IIA (T2, N0, M0) - Unsigned       Staging comments: Staged at breast conference on 3.16.16    CONSENT VERIFIED: yes   SET UP: Patient is setup supine   IMMOBILIZATION:  The following immobilization was used:Custom Moldable Pillow, breast board.   NARRATIVE: Gwen Her Cox-Fishman underwent complex simulation and treatment planning for her boost treatment today.  Her tumor volume was outlined on the planning CT scan. The depth of her cavity was felt to be appropriate for treatment with electrons    12  MeV electrons will be prescribed to the 100%  isodose line.   I personally oversaw and approved the construction of a unique block which will be used for beam modification purposes.  An isodose plan is requested.

## 2015-09-03 NOTE — Progress Notes (Signed)
  Radiation Oncology         [phone] ________________________________  Name: Rhonda Gardner MRN: [id number]  Date: 08/11/2015  DOB: 1956/03/21  End of Treatment Note  Diagnosis:   Breast cancer of upper-outer quadrant of left female breast [facility])   Staging form: Breast, AJCC 7th Edition     Clinical stage from 12/28/2014: Stage IIA (T2, N0, M0) - Unsigned       Staging comments: Staged at breast conference on 3.16.16  Indication for treatment:  Curative      Radiation treatment dates:   06/28/2015-08/11/2015  Site/dose:   Left breast/ 45 Gy at 1.8 Gy per fraction x 25 fractions.  Left breast boost/ 16 Gy at 2 Gy per fraction x 8 fractions  Beams/energy:  Opposed tangents with reduced fields / 6 MV photons Enface electrons / 12 MeV electrons.  Narrative: The patient tolerated radiation treatment relatively well.     Plan: The patient has completed radiation treatment. The patient will return to radiation oncology clinic for routine followup in one month. I advised them to call or return sooner if they have any questions or concerns related to their recovery or treatment.  ------------------------------------------------  Thea Silversmith, MD

## 2015-09-03 NOTE — Progress Notes (Signed)
Radiation Oncology         [phone] ________________________________  Name: Rhonda Gardner      MRN: [id number]          Date: 06/20/15             DOB: 1955-12-24  Optical Surface Tracking Plan:  Since intensity modulated radiotherapy (IMRT) and 3D conformal radiation treatment methods are predicated on accurate and precise positioning for treatment, intrafraction motion monitoring is medically necessary to ensure accurate and safe treatment delivery.  The ability to quantify intrafraction motion without excessive ionizing radiation dose can only be performed with optical surface tracking. Accordingly, surface imaging offers the opportunity to obtain 3D measurements of patient position throughout IMRT and 3D treatments without excessive radiation exposure.  I am ordering optical surface tracking for this patients upcoming course of radiotherapy. ________________________________ Signature   Reference:   Ursula Alert, J, et al. Surface imaging-based analysis of intrafraction motion for breast radiotherapy patients.Journal of [employer], n. 6, nov. 2014. ISSN [id number].   Available at: <http://www.jacmp.org/index.php/jacmp/article/view/4957>.

## 2015-09-05 ENCOUNTER — Ambulatory Visit (HOSPITAL_BASED_OUTPATIENT_CLINIC_OR_DEPARTMENT_OTHER): Payer: BLUE CROSS/BLUE SHIELD | Admitting: Oncology

## 2015-09-05 ENCOUNTER — Ambulatory Visit (HOSPITAL_BASED_OUTPATIENT_CLINIC_OR_DEPARTMENT_OTHER): Payer: BLUE CROSS/BLUE SHIELD

## 2015-09-05 ENCOUNTER — Encounter: Payer: Self-pay | Admitting: *Deleted

## 2015-09-05 ENCOUNTER — Other Ambulatory Visit (HOSPITAL_BASED_OUTPATIENT_CLINIC_OR_DEPARTMENT_OTHER): Payer: BLUE CROSS/BLUE SHIELD

## 2015-09-05 ENCOUNTER — Telehealth: Payer: Self-pay | Admitting: Oncology

## 2015-09-05 VITALS — BP 119/51 | HR 84 | Temp 98.5°F | Resp 18 | Ht 68.0 in | Wt 143.5 lb

## 2015-09-05 DIAGNOSIS — C50412 Malignant neoplasm of upper-outer quadrant of left female breast: Secondary | ICD-10-CM

## 2015-09-05 DIAGNOSIS — Z171 Estrogen receptor negative status [ER-]: Secondary | ICD-10-CM | POA: Diagnosis not present

## 2015-09-05 DIAGNOSIS — Z5112 Encounter for antineoplastic immunotherapy: Secondary | ICD-10-CM | POA: Diagnosis not present

## 2015-09-05 LAB — COMPREHENSIVE METABOLIC PANEL (CC13)
ALBUMIN: 4 g/dL (ref 3.5–5.0)
ALT: 19 U/L (ref 0–55)
ANION GAP: 11 meq/L (ref 3–11)
AST: 25 U/L (ref 5–34)
Alkaline Phosphatase: 88 U/L (ref 40–150)
BILIRUBIN TOTAL: 0.34 mg/dL (ref 0.20–1.20)
BUN: 18.3 mg/dL (ref 7.0–26.0)
CHLORIDE: 103 meq/L (ref 98–109)
CO2: 26 meq/L (ref 22–29)
Calcium: 9.1 mg/dL (ref 8.4–10.4)
Creatinine: 1 mg/dL (ref 0.6–1.1)
EGFR: 61 mL/min/{1.73_m2} — AB (ref >90)
GLUCOSE: 96 mg/dL (ref 70–140)
POTASSIUM: 3.8 meq/L (ref 3.5–5.1)
Sodium: 140 mEq/L (ref 136–145)
Total Protein: 6.6 g/dL (ref 6.4–8.3)

## 2015-09-05 LAB — CBC WITH DIFFERENTIAL/PLATELET
BASO%: 0.8 % (ref 0.0–2.0)
BASOS ABS: 0 10*3/uL (ref 0.0–0.1)
EOS%: 1.7 % (ref 0.0–7.0)
Eosinophils Absolute: 0.1 10*3/uL (ref 0.0–0.5)
HEMATOCRIT: 36.8 % (ref 34.8–46.6)
HEMOGLOBIN: 12.1 g/dL (ref 11.6–15.9)
LYMPH#: 0.6 10*3/uL — AB (ref 0.9–3.3)
LYMPH%: 9.7 % — ABNORMAL LOW (ref 14.0–49.7)
MCH: 31 pg (ref 25.1–34.0)
MCHC: 32.8 g/dL (ref 31.5–36.0)
MCV: 94.5 fL (ref 79.5–101.0)
MONO#: 0.5 10*3/uL (ref 0.1–0.9)
MONO%: 9.1 % (ref 0.0–14.0)
NEUT%: 78.7 % — ABNORMAL HIGH (ref 38.4–76.8)
NEUTROS ABS: 4.5 10*3/uL (ref 1.5–6.5)
Platelets: 218 10*3/uL (ref 145–400)
RBC: 3.89 10*6/uL (ref 3.70–5.45)
RDW: 13 % (ref 11.2–14.5)
WBC: 5.8 10*3/uL (ref 3.9–10.3)

## 2015-09-05 MED ORDER — ACETAMINOPHEN 325 MG PO TABS
650.0000 mg | ORAL_TABLET | Freq: Once | ORAL | Status: AC
  Administered 2015-09-05: 650 mg via ORAL

## 2015-09-05 MED ORDER — SODIUM CHLORIDE 0.9 % IJ SOLN
10.0000 mL | INTRAMUSCULAR | Status: DC | PRN
  Administered 2015-09-05: 10 mL
  Filled 2015-09-05: qty 10

## 2015-09-05 MED ORDER — DIPHENHYDRAMINE HCL 25 MG PO CAPS
25.0000 mg | ORAL_CAPSULE | Freq: Once | ORAL | Status: AC
  Administered 2015-09-05: 25 mg via ORAL

## 2015-09-05 MED ORDER — HEPARIN SOD (PORK) LOCK FLUSH 100 UNIT/ML IV SOLN
500.0000 [IU] | Freq: Once | INTRAVENOUS | Status: AC | PRN
  Administered 2015-09-05: 500 [IU]
  Filled 2015-09-05: qty 5

## 2015-09-05 MED ORDER — DIPHENHYDRAMINE HCL 25 MG PO CAPS
ORAL_CAPSULE | ORAL | Status: AC
  Filled 2015-09-05: qty 1

## 2015-09-05 MED ORDER — ACETAMINOPHEN 325 MG PO TABS
ORAL_TABLET | ORAL | Status: AC
  Filled 2015-09-05: qty 2

## 2015-09-05 MED ORDER — TRASTUZUMAB CHEMO INJECTION 440 MG
6.0000 mg/kg | Freq: Once | INTRAVENOUS | Status: AC
  Administered 2015-09-05: 399 mg via INTRAVENOUS
  Filled 2015-09-05: qty 19

## 2015-09-05 MED ORDER — SODIUM CHLORIDE 0.9 % IV SOLN
Freq: Once | INTRAVENOUS | Status: AC
  Administered 2015-09-05: 13:00:00 via INTRAVENOUS

## 2015-09-05 NOTE — Patient Instructions (Signed)
[facility] Discharge Instructions for Patients Receiving Chemotherapy  Today you received the following chemotherapy agents herceptin  To help prevent nausea and vomiting after your treatment, we encourage you to take your nausea medication  If you develop nausea and vomiting that is not controlled by your nausea medication, call the clinic.   BELOW ARE SYMPTOMS THAT SHOULD BE REPORTED IMMEDIATELY:  *FEVER GREATER THAN 100.5 F  *CHILLS WITH OR WITHOUT FEVER  NAUSEA AND VOMITING THAT IS NOT CONTROLLED WITH YOUR NAUSEA MEDICATION  *UNUSUAL SHORTNESS OF BREATH  *UNUSUAL BRUISING OR BLEEDING  TENDERNESS IN MOUTH AND THROAT WITH OR WITHOUT PRESENCE OF ULCERS  *URINARY PROBLEMS  *BOWEL PROBLEMS  UNUSUAL RASH Items with * indicate a potential emergency and should be followed up as soon as possible.  Feel free to call the clinic you have any questions or concerns. The clinic phone number is [phone].  Please show the CHEMO ALERT CARD at check-in to the Emergency Department and triage nurse.   

## 2015-09-05 NOTE — Telephone Encounter (Signed)
Appointments made and avs will be printed in chemo,echo to [facility] for precert  anne

## 2015-09-05 NOTE — Progress Notes (Signed)
[name]  [phone] Fax:([phone]) [phone]     ID: Rhonda Gardner DOB: May 03, 1956  MR#: [id number]  JYN#:[id number]  Patient Care Team: Rita Ohara, MD as PCP - General (Family Medicine) Chauncey Cruel, MD as Consulting Physician (Oncology) Thea Silversmith, MD as Consulting Physician (Radiation Oncology) Mauro Kaufmann, RN as Registered Nurse Rockwell Germany, RN as Registered Nurse Holley Bouche, NP as Nurse Practitioner (Nurse Practitioner) Autumn Messing III, MD as Consulting Physician (General Surgery) PCP: Vikki Ports, MD OTHER MD:  CHIEF COMPLAINT: HER-2 positive breast cancer  CURRENT TREATMENT:  anti-HER-2 immunotherapy   BREAST CANCER HISTORY: From the original intake note:  "Rhonda Gardner" had not had a physical exam in quite some time, and had not had a mammogram since 2020, when she was evaluated by Dr. Tomi Bamberger 12/01/2014. Dr. Tomi Bamberger was able to palpate a 2 cm firm mass at the 3:00 position in the left breast. The patient herself has been aware of multiple breast masses and does have a history of fibrocystic change, with multiple prior benign biopsies. She did not feel this particular mass had changed recently.   Dr. Tomi Bamberger arranged for bilateral diagnostic mammography and left breast ultrasonography at the breast Center 12/15/2014. This shows the breast density to be category C. In the left breast upper outer quadrant there was a bilobed mass measuring approximately 2 cm. This was palpable. Ultrasound confirmed an irregular microlobulated and hypoechoic mass measuring 2.1 cm at the 2:00 location 6 cm from the nipple. There was no left axillary adenopathy.  Biopsy of the mass in question 12/19/2014 showed ([id number]) invasive ductal carcinoma, grade 2 or 3, estrogen and progesterone receptor negative, with an MIB-1 of 38%, and equivocal HER-2 amplification, the signals ratio being 1.57, the number per cell 4.25.  On 12/27/2014 the patient underwent  bilateral breast MRI. The mass in question at the 2:00 location of the left breast measured 2.5 cm. It directly abuts the left pectoralis major. There is no enhancement in the muscle itself. There were no abnormal appearing lymph nodes. There was an incidentally noted 1 cm hepatic cyst at the dome of the right liver lobe.  Her subsequent history is as detailed below  INTERVAL HISTORY: Rhonda Gardner returns today for follow up of her HER-2 positive breast cancer, accompanied by her husband, Rhonda Gardner. Since her last visit with me she had her definitive surgery, which showed a complete pathologic response. There was no invasive disease in the breast or lymph nodes. There was some LCIS noted. She then proceeded to radiation, which she completed at the end of October. She generally did well with that. She had some fatigue, which is quickly improving. She had some skin changes which she tells me are normalizing rapidly.  REVIEW OF SYSTEMS: Rhonda Gardner still takes the occasional Tomi Bamberger and Rhonda Gardner feels she is not yet 80% back as first her energy level is concerned. 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 It is not there all the time. He doesn't seem to be related to the cold weather either. She has no neuropathy sensations in the fingertips. She still has some blurred vision. She has some sores in her nose at times and she has minimal epistaxis when she blows her nose. Her elbows hurt. Otherwise a detailed review of systems today was noncontributory  PAST MEDICAL HISTORY: Past Medical History  Diagnosis Date  . Allergy 2006  chemical cauterization in spring 2006, and 2nd treatment with good results.(Dr.Shoemaker)  . Bipolar disorder ([place])   . Breast cancer ([place]) 12/2014    invasive ductal carcinoma (LEFT)  . Breast cancer of upper-outer quadrant of left female breast ([place]) 12/22/2014  . PONV (postoperative nausea and vomiting)     PAST SURGICAL  HISTORY: Past Surgical History  Procedure Laterality Date  . Breast biopsy Bilateral     benign and a right stereotatic breast biopsy.  . Foot surgery Left 2014    Dr. Paulla Dolly  . Fibroid excision  age 27    prolapsed fibroid removed  . Radioactive seed guided mastectomy with axillary sentinel lymph node biopsy Left 05/25/2015    Procedure: LEFT BREAST LUMPECTOMY WITH RADIOACTIVE SEED AND SENTINEL LYMPH NODE BIOPSY;  Surgeon: Autumn Messing III, MD;  Location: [facility];  Service: General;  Laterality: Left;    FAMILY HISTORY Family History  Problem Relation Age of Onset  . Cancer Father     skin - non melanoma - farmer  . Heart disease Father   . Hypertension Mother   . Arthritis Mother     osteoarthritis  . Colon cancer Mother   . Macular degeneration Mother     wet and dry  . Cancer Mother 31    colon cancer at age 17  . Osteoporosis Sister     osteopenia  . Cancer Maternal Aunt     lung cancer  . Diabetes Paternal Aunt   . Cancer Paternal Aunt 4    ? stomach cancer  . Autism Sister   . Mental retardation Sister   . Blindness Sister     secondary to retrolental fibroplasia  . Goiter Paternal Aunt    the patient's father lived to be 95. He had a history of nonmelanoma skin cancers. [name] mother is living at age 67. She was diagnosed with colon cancer in her 46s. A paternal aunt may have had stomach cancer. Maternal aunt developed lung cancer in her 8s. The patient has one brother, 2 sisters. One of her sisters is blind and mentally retarded. There is no history of breast or ovarian cancer in the family.  GYNECOLOGIC HISTORY:  No LMP recorded. Patient is postmenopausal. Menarche age 35. The patient is GX P0. She stopped having periods in her early 36s. She did not take hormone replacement. She took oral contraceptives remotely for over 5 years with no complications.  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 Her husband Rhonda Gardner worked in IT  originally but now runs a family firm. It's just the 2 of them at home, plus a chocolate lab.    ADVANCED DIRECTIVES: In place   HEALTH MAINTENANCE: Social History  Substance Use Topics  . Smoking status: Never Smoker   . Smokeless tobacco: Never Used  . Alcohol Use: No     Colonoscopy: In [place] under Dr. Lavina Hamman, date not entirely clear  PAP: February 2016  Bone density: Under [facility] Radon, results not currently available  Lipid panel:  Allergies  Allergen Reactions  . Adhesive [Tape] Rash    Pt is sensitive to any kind of adhesive tape products.    Current Outpatient Prescriptions  Medication Sig Dispense Refill  . Cholecalciferol (VITAMIN D) 2000 UNITS tablet Take 2,000 Units by mouth daily.    . Cyanocobalamin (VITAMIN B-12) 5000 MCG SUBL Place 5,000 mcg under the tongue daily.    Marland Kitchen LAMICTAL 200 MG tablet Take 0.5 tablets (100 mg total)  by mouth daily. 90 tablet 0  . lidocaine-prilocaine (EMLA) cream Apply to affected area once 30 g 3  . LUTEIN-ZEAXANTHIN PO Take 1 tablet by mouth daily.    . Misc Natural Products (TART CHERRY ADVANCED) CAPS Take 1 capsule by mouth daily.    . Multiple Vitamins-Minerals (PRESERVISION AREDS) CAPS Take 1 capsule by mouth 2 (two) times daily.    . naproxen sodium (ANAPROX) 220 MG tablet Take 220-440 mg by mouth daily as needed (pain). Aleve     No current facility-administered medications for this visit.    OBJECTIVE: Middle-aged white woman who appears well Filed Vitals:   09/05/15 1143  BP: 119/51  Pulse: 84  Temp: 98.5 F (36.9 C)  Resp: 18     Body mass index is 21.82 kg/(m^2).    ECOG FS:1 - Symptomatic but completely ambulatory   Sclerae unicteric, pupils round and equal Oropharynx clear and moist-- no thrush or other lesions No cervical or supraclavicular adenopathy Lungs no rales or rhonchi Heart regular rate and rhythm Abd soft, nontender, positive bowel sounds MSK no focal spinal tenderness, no upper extremity  lymphedema Neuro: nonfocal, well oriented, appropriate affect Breasts: The right breast is unremarkable. The left breast is status post lumpectomy and radiation. The scar is not visible anteriorly and the breast hangs very normally. There is very minimal erythema remaining from the radiation, no desquamation. Overall the cosmetic result is excellent. The left axilla is benign.  LAB RESULTS:  CMP     Component Value Date/Time   NA 140 08/14/2015 1115   NA 142 01/13/2015 0822   K 4.1 08/14/2015 1115   K 3.8 01/13/2015 0822   CL 106 01/13/2015 0822   CO2 30* 08/14/2015 1115   CO2 26 01/13/2015 0822   GLUCOSE 122 08/14/2015 1115   GLUCOSE 83 01/13/2015 0822   BUN 16.1 08/14/2015 1115   BUN 8 01/13/2015 0822   CREATININE 0.8 08/14/2015 1115   CREATININE 0.77 01/13/2015 0822   CREATININE 0.80 12/01/2014 1437   CALCIUM 9.2 08/14/2015 1115   CALCIUM 9.1 01/13/2015 0822   PROT 6.8 08/14/2015 1115   PROT 7.0 12/01/2014 1437   ALBUMIN 4.2 08/14/2015 1115   ALBUMIN 4.8 12/01/2014 1437   AST 21 08/14/2015 1115   AST 28 12/01/2014 1437   ALT 18 08/14/2015 1115   ALT 24 12/01/2014 1437   ALKPHOS 78 08/14/2015 1115   ALKPHOS 65 12/01/2014 1437   BILITOT 0.41 08/14/2015 1115   BILITOT 0.6 12/01/2014 1437   GFRNONAA 89* 01/13/2015 0822   GFRAA >90 01/13/2015 0822    INo results found for: SPEP, UPEP  Lab Results  Component Value Date   WBC 5.8 09/05/2015   NEUTROABS 4.5 09/05/2015   HGB 12.1 09/05/2015   HCT 36.8 09/05/2015   MCV 94.5 09/05/2015   PLT 218 09/05/2015      Chemistry      Component Value Date/Time   NA 140 08/14/2015 1115   NA 142 01/13/2015 0822   K 4.1 08/14/2015 1115   K 3.8 01/13/2015 0822   CL 106 01/13/2015 0822   CO2 30* 08/14/2015 1115   CO2 26 01/13/2015 0822   BUN 16.1 08/14/2015 1115   BUN 8 01/13/2015 0822   CREATININE 0.8 08/14/2015 1115   CREATININE 0.77 01/13/2015 0822   CREATININE 0.80 12/01/2014 1437      Component Value Date/Time    CALCIUM 9.2 08/14/2015 1115   CALCIUM 9.1 01/13/2015 0822   ALKPHOS 78 08/14/2015 1115  ALKPHOS 65 12/01/2014 1437   AST 21 08/14/2015 1115   AST 28 12/01/2014 1437   ALT 18 08/14/2015 1115   ALT 24 12/01/2014 1437   BILITOT 0.41 08/14/2015 1115   BILITOT 0.6 12/01/2014 1437       No results found for: LABCA2  No components found for: [id number]  No results for input(s): INR in the last 168 hours.  Urinalysis    Component Value Date/Time   LABSPEC 1.005 04/03/2015 1451   LABSPEC 1.010 08/09/2007 2121   PHURINE 7.5 04/03/2015 1451   PHURINE 7.5 08/09/2007 2121   GLUCOSEU Negative 04/03/2015 1451   GLUCOSEU NEGATIVE 08/09/2007 2121   HGBUR Trace 04/03/2015 1451   HGBUR SMALL* 08/09/2007 2121   BILIRUBINUR Negative 04/03/2015 1451   BILIRUBINUR neg 12/01/2014 [place] 08/09/2007 2121   KETONESUR Negative 04/03/2015 [place] 08/09/2007 2121   PROTEINUR Negative 04/03/2015 1451   PROTEINUR neg 12/01/2014 [place] 08/09/2007 2121   UROBILINOGEN 0.2 04/03/2015 1451   UROBILINOGEN negative 12/01/2014 1358   UROBILINOGEN 0.2 08/09/2007 2121   NITRITE Negative 04/03/2015 1451   NITRITE neg 12/01/2014 1358   NITRITE NEGATIVE 08/09/2007 2121   LEUKOCYTESUR Small 04/03/2015 1451   LEUKOCYTESUR Negative 12/01/2014 1358    STUDIES: No results found.  ASSESSMENT: 59 y.o. [place] woman s/p left breast upper outer quadrant biopsy 12/19/2014 for a clinical T2 N0, stage IIA invasive ductal carcinoma, grade 2 or 3, estrogen and progesterone receptor negative, with an MIB-1 of 38%, HER-2 equivocal initially but positive on further testing with a ratio of 3.4  (1) neoadjuvant chemotherapy consisted of carboplatin, docetaxel, trastuzumab and pertuzumab given every 3 weeks for 6 cycles with Neulasta support--started 01/16/2015, completed 05/01/2015  (a) docetaxel switched for gemcitabine for final 2 cycles because of persistent  neuropathy.  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  Left breast boost/ 16 Gy at 2 Gy per fraction x 8 fractions  (4) genetics counselor felt the risk of a familial mutation was sufficiently low testing could be omitted  (5) continuing trastuzumab every 3 weeks to complete a year (through March 2017)  (a) most recent echocardiogram 07/03/2015 shows an ejection fraction in the 65%-70% range  PLAN: Rhonda Gardner did well with her radiation treatments and is recovering nicely. She has a lot more energy although not quite up to her usual turbocharged standards. Her skin all also has healed nicely and overall the cosmetic result from her local treatment is excellent.  She continues to receive trastuzumab with essentially no side effects. She needs have repeat echo at the end of December. She will have her last treatment the last week in March. I have set her up for repeat echocardiogram late December.  She will need a repeat mammogram sometime in February or March. This is very painful for her because her tumor was so lateral. When we schedule that we will discuss with the breast Center mammographer deceive there is anything we can do to make it a little bit better for her.  Otherwise she will return to see me late January. She knows to call for any problems that may develop before that visit.   Chauncey Cruel, MD   09/05/2015 12:06 PM

## 2015-09-21 ENCOUNTER — Telehealth: Payer: Self-pay | Admitting: *Deleted

## 2015-09-21 NOTE — Telephone Encounter (Signed)
TC from patient this am requesting an 'ok'  from Dr. Jana Hakim for patient to go on a cruise in March 2017.  She is currently only on herceptin and the timing of her crusie will not interfere with her treatment schedule.

## 2015-09-26 ENCOUNTER — Other Ambulatory Visit: Payer: Self-pay | Admitting: Oncology

## 2015-09-26 ENCOUNTER — Ambulatory Visit (HOSPITAL_BASED_OUTPATIENT_CLINIC_OR_DEPARTMENT_OTHER): Payer: BLUE CROSS/BLUE SHIELD

## 2015-09-26 ENCOUNTER — Telehealth: Payer: Self-pay | Admitting: Oncology

## 2015-09-26 ENCOUNTER — Other Ambulatory Visit (HOSPITAL_BASED_OUTPATIENT_CLINIC_OR_DEPARTMENT_OTHER): Payer: BLUE CROSS/BLUE SHIELD

## 2015-09-26 VITALS — BP 128/64 | HR 79 | Temp 98.4°F | Resp 18

## 2015-09-26 DIAGNOSIS — C50412 Malignant neoplasm of upper-outer quadrant of left female breast: Secondary | ICD-10-CM | POA: Diagnosis not present

## 2015-09-26 DIAGNOSIS — Z5112 Encounter for antineoplastic immunotherapy: Secondary | ICD-10-CM | POA: Diagnosis not present

## 2015-09-26 DIAGNOSIS — C50312 Malignant neoplasm of lower-inner quadrant of left female breast: Secondary | ICD-10-CM

## 2015-09-26 LAB — CBC WITH DIFFERENTIAL/PLATELET
BASO%: 1.4 % (ref 0.0–2.0)
BASOS ABS: 0.1 10*3/uL (ref 0.0–0.1)
EOS%: 3.3 % (ref 0.0–7.0)
Eosinophils Absolute: 0.2 10*3/uL (ref 0.0–0.5)
HEMATOCRIT: 37.7 % (ref 34.8–46.6)
HEMOGLOBIN: 12.2 g/dL (ref 11.6–15.9)
LYMPH#: 0.6 10*3/uL — AB (ref 0.9–3.3)
LYMPH%: 11.3 % — ABNORMAL LOW (ref 14.0–49.7)
MCH: 30.7 pg (ref 25.1–34.0)
MCHC: 32.5 g/dL (ref 31.5–36.0)
MCV: 94.4 fL (ref 79.5–101.0)
MONO#: 0.5 10*3/uL (ref 0.1–0.9)
MONO%: 10 % (ref 0.0–14.0)
NEUT#: 3.7 10*3/uL (ref 1.5–6.5)
NEUT%: 74 % (ref 38.4–76.8)
Platelets: 201 10*3/uL (ref 145–400)
RBC: 3.99 10*6/uL (ref 3.70–5.45)
RDW: 13.5 % (ref 11.2–14.5)
WBC: 5 10*3/uL (ref 3.9–10.3)

## 2015-09-26 LAB — COMPREHENSIVE METABOLIC PANEL
ALT: 20 U/L (ref 0–55)
ANION GAP: 9 meq/L (ref 3–11)
AST: 26 U/L (ref 5–34)
Albumin: 4.1 g/dL (ref 3.5–5.0)
Alkaline Phosphatase: 75 U/L (ref 40–150)
BILIRUBIN TOTAL: 0.39 mg/dL (ref 0.20–1.20)
BUN: 18.5 mg/dL (ref 7.0–26.0)
CALCIUM: 9.2 mg/dL (ref 8.4–10.4)
CO2: 25 meq/L (ref 22–29)
CREATININE: 0.9 mg/dL (ref 0.6–1.1)
Chloride: 108 mEq/L (ref 98–109)
EGFR: 68 mL/min/{1.73_m2} — ABNORMAL LOW (ref >90)
Glucose: 86 mg/dl (ref 70–140)
Potassium: 4.2 mEq/L (ref 3.5–5.1)
Sodium: 142 mEq/L (ref 136–145)
TOTAL PROTEIN: 6.8 g/dL (ref 6.4–8.3)

## 2015-09-26 MED ORDER — TRASTUZUMAB CHEMO INJECTION 440 MG
6.0000 mg/kg | Freq: Once | INTRAVENOUS | Status: AC
  Administered 2015-09-26: 399 mg via INTRAVENOUS
  Filled 2015-09-26: qty 19

## 2015-09-26 MED ORDER — ACETAMINOPHEN 325 MG PO TABS
650.0000 mg | ORAL_TABLET | Freq: Once | ORAL | Status: AC
  Administered 2015-09-26: 650 mg via ORAL

## 2015-09-26 MED ORDER — SODIUM CHLORIDE 0.9 % IJ SOLN
10.0000 mL | INTRAMUSCULAR | Status: DC | PRN
  Administered 2015-09-26: 10 mL
  Filled 2015-09-26: qty 10

## 2015-09-26 MED ORDER — DIPHENHYDRAMINE HCL 25 MG PO CAPS
ORAL_CAPSULE | ORAL | Status: AC
  Filled 2015-09-26: qty 1

## 2015-09-26 MED ORDER — DIPHENHYDRAMINE HCL 25 MG PO CAPS: 25.0000 mg | ORAL_CAPSULE | Freq: Once | ORAL | Status: DC

## 2015-09-26 MED ORDER — ACETAMINOPHEN 325 MG PO TABS
ORAL_TABLET | ORAL | Status: AC
  Filled 2015-09-26: qty 2

## 2015-09-26 MED ORDER — HEPARIN SOD (PORK) LOCK FLUSH 100 UNIT/ML IV SOLN
500.0000 [IU] | Freq: Once | INTRAVENOUS | Status: AC | PRN
  Administered 2015-09-26: 500 [IU]
  Filled 2015-09-26: qty 5

## 2015-09-26 MED ORDER — SODIUM CHLORIDE 0.9 % IV SOLN
Freq: Once | INTRAVENOUS | Status: AC
  Administered 2015-09-26: 12:00:00 via INTRAVENOUS

## 2015-09-26 NOTE — Telephone Encounter (Signed)
Appointments made and avs printed for patient °

## 2015-09-26 NOTE — Telephone Encounter (Signed)
Just this morning woke up with blurry vision in Left eye, this has been for a few hours and not going away. She has had blurry vision in the past but it has gone away. No crusting or oozing from eye, no fever, no swelling of eye. Pt has constant runny nose. What does she need to do?  Also she is still waiting for an answer about getting an "OK" to go on a cruise in March 2017. See attached note.

## 2015-09-26 NOTE — Progress Notes (Signed)
Patient states that the blurry vision continues however does not seem as bad.  Patient will stop by the breast center per Dr. Jana Hakim after her infusion today.

## 2015-09-26 NOTE — Patient Instructions (Signed)
[facility] Discharge Instructions for Patients Receiving Chemotherapy  Today you received the following chemotherapy agents; Herceptin.    To help prevent nausea and vomiting after your treatment, we encourage you to take your nausea medication;  Herceptin.    If you develop nausea and vomiting that is not controlled by your nausea medication, call the clinic.   BELOW ARE SYMPTOMS THAT SHOULD BE REPORTED IMMEDIATELY:  *FEVER GREATER THAN 100.5 F  *CHILLS WITH OR WITHOUT FEVER  NAUSEA AND VOMITING THAT IS NOT CONTROLLED WITH YOUR NAUSEA MEDICATION  *UNUSUAL SHORTNESS OF BREATH  *UNUSUAL BRUISING OR BLEEDING  TENDERNESS IN MOUTH AND THROAT WITH OR WITHOUT PRESENCE OF ULCERS  *URINARY PROBLEMS  *BOWEL PROBLEMS  UNUSUAL RASH Items with * indicate a potential emergency and should be followed up as soon as possible.  Feel free to call the clinic you have any questions or concerns. The clinic phone number is [phone].  Please show the [facility] at check-in to the Emergency Department and triage nurse.

## 2015-09-26 NOTE — Telephone Encounter (Signed)
KAY, PLEASE CALL PT AND IF SHE STILL HAS BLURRED VISION HAVE HER STOP IN NOW FOR ME TO LOOK IN THE EYE FIRST OF ALL  THANKS

## 2015-09-29 ENCOUNTER — Telehealth: Payer: Self-pay | Admitting: *Deleted

## 2015-09-29 NOTE — Telephone Encounter (Signed)
Message left by pt stating she is scheduled for an MRI later this month due to concerns secondary to blurred vision at last visit.  Per message pt states symptoms have abated and is inquiring "do I need to proceed with the MRI "   Return call number given as [phone].  Above will be given to MD for review for recommendation.

## 2015-10-02 NOTE — Telephone Encounter (Signed)
LVM letting patient know that Dr. Jana Hakim does want her to go through with the MRI of the brain concerning her blurred vision although at this time her vision is normal with no signs of her vision being compromised.

## 2015-10-05 ENCOUNTER — Telehealth: Payer: Self-pay | Admitting: Oncology

## 2015-10-05 ENCOUNTER — Telehealth: Payer: Self-pay | Admitting: *Deleted

## 2015-10-05 NOTE — Telephone Encounter (Signed)
Aware of her echo appt

## 2015-10-05 NOTE — Telephone Encounter (Signed)
I have called and moved her appt from 1/3 to 1/4 due to the holiday. Patient agreeable to move appts. Patient given new date/time. Patient had question about her MRI, I transferred her to the desk RN.  JMW

## 2015-10-11 ENCOUNTER — Other Ambulatory Visit: Payer: Self-pay | Admitting: Oncology

## 2015-10-11 ENCOUNTER — Ambulatory Visit (HOSPITAL_COMMUNITY)
Admission: RE | Admit: 2015-10-11 | Discharge: 2015-10-11 | Disposition: A | Discharge status: Home / Self Care | Payer: BLUE CROSS/BLUE SHIELD | Source: Ambulatory Visit | Attending: Oncology | Admitting: Oncology

## 2015-10-11 DIAGNOSIS — C50412 Malignant neoplasm of upper-outer quadrant of left female breast: Secondary | ICD-10-CM

## 2015-10-11 DIAGNOSIS — H539 Unspecified visual disturbance: Secondary | ICD-10-CM | POA: Insufficient documentation

## 2015-10-11 DIAGNOSIS — I071 Rheumatic tricuspid insufficiency: Secondary | ICD-10-CM | POA: Insufficient documentation

## 2015-10-11 NOTE — Progress Notes (Signed)
   Echocardiogram 2D Echocardiogram limited has been performed.  Jennette Dubin 10/11/2015, 10:54 AM

## 2015-10-12 ENCOUNTER — Ambulatory Visit (HOSPITAL_COMMUNITY)
Admission: RE | Admit: 2015-10-12 | Discharge: 2015-10-12 | Disposition: A | Discharge status: Home / Self Care | Payer: BLUE CROSS/BLUE SHIELD | Source: Ambulatory Visit | Attending: Oncology | Admitting: Oncology

## 2015-10-12 DIAGNOSIS — C50312 Malignant neoplasm of lower-inner quadrant of left female breast: Secondary | ICD-10-CM

## 2015-10-12 DIAGNOSIS — C50412 Malignant neoplasm of upper-outer quadrant of left female breast: Secondary | ICD-10-CM | POA: Diagnosis not present

## 2015-10-12 MED ORDER — GADOBENATE DIMEGLUMINE 529 MG/ML IV SOLN
15.0000 mL | Freq: Once | INTRAVENOUS | Status: AC | PRN
  Administered 2015-10-12: 13 mL via INTRAVENOUS

## 2015-10-17 ENCOUNTER — Ambulatory Visit: Payer: BLUE CROSS/BLUE SHIELD

## 2015-10-17 ENCOUNTER — Other Ambulatory Visit: Payer: BLUE CROSS/BLUE SHIELD

## 2015-10-18 ENCOUNTER — Other Ambulatory Visit (HOSPITAL_BASED_OUTPATIENT_CLINIC_OR_DEPARTMENT_OTHER): Payer: BLUE CROSS/BLUE SHIELD

## 2015-10-18 ENCOUNTER — Ambulatory Visit (HOSPITAL_BASED_OUTPATIENT_CLINIC_OR_DEPARTMENT_OTHER): Payer: BLUE CROSS/BLUE SHIELD

## 2015-10-18 VITALS — BP 115/67 | HR 78 | Temp 98.8°F | Resp 18

## 2015-10-18 DIAGNOSIS — C50412 Malignant neoplasm of upper-outer quadrant of left female breast: Secondary | ICD-10-CM | POA: Diagnosis not present

## 2015-10-18 DIAGNOSIS — Z5112 Encounter for antineoplastic immunotherapy: Secondary | ICD-10-CM

## 2015-10-18 LAB — COMPREHENSIVE METABOLIC PANEL
ALK PHOS: 70 U/L (ref 40–150)
ALT: 25 U/L (ref 0–55)
ANION GAP: 8 meq/L (ref 3–11)
AST: 28 U/L (ref 5–34)
Albumin: 4.1 g/dL (ref 3.5–5.0)
BUN: 15 mg/dL (ref 7.0–26.0)
CALCIUM: 9.1 mg/dL (ref 8.4–10.4)
CHLORIDE: 106 meq/L (ref 98–109)
CO2: 27 mEq/L (ref 22–29)
Creatinine: 0.9 mg/dL (ref 0.6–1.1)
EGFR: 72 mL/min/{1.73_m2} — AB (ref >90)
Glucose: 88 mg/dl (ref 70–140)
POTASSIUM: 4.7 meq/L (ref 3.5–5.1)
Sodium: 141 mEq/L (ref 136–145)
Total Bilirubin: 0.55 mg/dL (ref 0.20–1.20)
Total Protein: 6.9 g/dL (ref 6.4–8.3)

## 2015-10-18 LAB — CBC WITH DIFFERENTIAL/PLATELET
BASO%: 1.1 % (ref 0.0–2.0)
BASOS ABS: 0 10*3/uL (ref 0.0–0.1)
EOS%: 3.1 % (ref 0.0–7.0)
Eosinophils Absolute: 0.1 10*3/uL (ref 0.0–0.5)
HEMATOCRIT: 38.3 % (ref 34.8–46.6)
HEMOGLOBIN: 12.8 g/dL (ref 11.6–15.9)
LYMPH#: 0.6 10*3/uL — AB (ref 0.9–3.3)
LYMPH%: 15 % (ref 14.0–49.7)
MCH: 31.5 pg (ref 25.1–34.0)
MCHC: 33.5 g/dL (ref 31.5–36.0)
MCV: 94 fL (ref 79.5–101.0)
MONO#: 0.4 10*3/uL (ref 0.1–0.9)
MONO%: 10 % (ref 0.0–14.0)
NEUT%: 70.8 % (ref 38.4–76.8)
NEUTROS ABS: 3 10*3/uL (ref 1.5–6.5)
Platelets: 200 10*3/uL (ref 145–400)
RBC: 4.08 10*6/uL (ref 3.70–5.45)
RDW: 13.5 % (ref 11.2–14.5)
WBC: 4.3 10*3/uL (ref 3.9–10.3)

## 2015-10-18 MED ORDER — SODIUM CHLORIDE 0.9 % IJ SOLN
10.0000 mL | INTRAMUSCULAR | Status: DC | PRN
  Administered 2015-10-18: 10 mL
  Filled 2015-10-18: qty 10

## 2015-10-18 MED ORDER — ACETAMINOPHEN 325 MG PO TABS
650.0000 mg | ORAL_TABLET | Freq: Once | ORAL | Status: AC
  Administered 2015-10-18: 650 mg via ORAL

## 2015-10-18 MED ORDER — DIPHENHYDRAMINE HCL 25 MG PO CAPS
ORAL_CAPSULE | ORAL | Status: AC
Start: 2015-10-18 — End: 2015-10-18
  Filled 2015-10-18: qty 1

## 2015-10-18 MED ORDER — DIPHENHYDRAMINE HCL 25 MG PO CAPS
25.0000 mg | ORAL_CAPSULE | Freq: Once | ORAL | Status: AC
  Administered 2015-10-18: 25 mg via ORAL

## 2015-10-18 MED ORDER — HEPARIN SOD (PORK) LOCK FLUSH 100 UNIT/ML IV SOLN
500.0000 [IU] | Freq: Once | INTRAVENOUS | Status: AC | PRN
Start: 2015-10-18 — End: 2015-10-18
  Administered 2015-10-18: 500 [IU]
  Filled 2015-10-18: qty 5

## 2015-10-18 MED ORDER — SODIUM CHLORIDE 0.9 % IV SOLN
Freq: Once | INTRAVENOUS | Status: AC
  Administered 2015-10-18: 09:00:00 via INTRAVENOUS

## 2015-10-18 MED ORDER — TRASTUZUMAB CHEMO INJECTION 440 MG
6.0000 mg/kg | Freq: Once | INTRAVENOUS | Status: AC
  Administered 2015-10-18: 399 mg via INTRAVENOUS
  Filled 2015-10-18: qty 19

## 2015-10-18 MED ORDER — ACETAMINOPHEN 325 MG PO TABS
ORAL_TABLET | ORAL | Status: AC
  Filled 2015-10-18: qty 2

## 2015-10-18 NOTE — Patient Instructions (Signed)
[facility] Discharge Instructions for Patients Receiving Chemotherapy  Today you received the following chemotherapy agents: Herceptin   To help prevent nausea and vomiting after your treatment, we encourage you to take your nausea medication as directed.    If you develop nausea and vomiting that is not controlled by your nausea medication, call the clinic.   BELOW ARE SYMPTOMS THAT SHOULD BE REPORTED IMMEDIATELY:  *FEVER GREATER THAN 100.5 F  *CHILLS WITH OR WITHOUT FEVER  NAUSEA AND VOMITING THAT IS NOT CONTROLLED WITH YOUR NAUSEA MEDICATION  *UNUSUAL SHORTNESS OF BREATH  *UNUSUAL BRUISING OR BLEEDING  TENDERNESS IN MOUTH AND THROAT WITH OR WITHOUT PRESENCE OF ULCERS  *URINARY PROBLEMS  *BOWEL PROBLEMS  UNUSUAL RASH Items with * indicate a potential emergency and should be followed up as soon as possible.  Feel free to call the clinic you have any questions or concerns. The clinic phone number is [phone].  Please show the CHEMO ALERT CARD at check-in to the Emergency Department and triage nurse.   

## 2015-11-06 ENCOUNTER — Ambulatory Visit (HOSPITAL_BASED_OUTPATIENT_CLINIC_OR_DEPARTMENT_OTHER): Payer: BLUE CROSS/BLUE SHIELD

## 2015-11-06 ENCOUNTER — Other Ambulatory Visit (HOSPITAL_BASED_OUTPATIENT_CLINIC_OR_DEPARTMENT_OTHER): Payer: BLUE CROSS/BLUE SHIELD

## 2015-11-06 ENCOUNTER — Encounter: Payer: Self-pay | Admitting: *Deleted

## 2015-11-06 ENCOUNTER — Telehealth: Payer: Self-pay | Admitting: Oncology

## 2015-11-06 ENCOUNTER — Ambulatory Visit (HOSPITAL_BASED_OUTPATIENT_CLINIC_OR_DEPARTMENT_OTHER): Payer: BLUE CROSS/BLUE SHIELD | Admitting: Oncology

## 2015-11-06 ENCOUNTER — Other Ambulatory Visit: Payer: Self-pay | Admitting: *Deleted

## 2015-11-06 VITALS — BP 127/63 | HR 89 | Temp 98.6°F | Resp 18 | Ht 68.0 in | Wt 144.1 lb

## 2015-11-06 DIAGNOSIS — Z170 Estrogen receptor positive status [ER+]: Secondary | ICD-10-CM | POA: Diagnosis not present

## 2015-11-06 DIAGNOSIS — T451X5A Adverse effect of antineoplastic and immunosuppressive drugs, initial encounter: Secondary | ICD-10-CM

## 2015-11-06 DIAGNOSIS — C50412 Malignant neoplasm of upper-outer quadrant of left female breast: Secondary | ICD-10-CM

## 2015-11-06 DIAGNOSIS — G620 Drug-induced polyneuropathy: Secondary | ICD-10-CM

## 2015-11-06 DIAGNOSIS — Z5112 Encounter for antineoplastic immunotherapy: Secondary | ICD-10-CM | POA: Diagnosis not present

## 2015-11-06 LAB — COMPREHENSIVE METABOLIC PANEL
ALT: 18 U/L (ref 0–55)
AST: 24 U/L (ref 5–34)
Albumin: 4.3 g/dL (ref 3.5–5.0)
Alkaline Phosphatase: 85 U/L (ref 40–150)
Anion Gap: 8 mEq/L (ref 3–11)
BUN: 15 mg/dL (ref 7.0–26.0)
CALCIUM: 9.1 mg/dL (ref 8.4–10.4)
CHLORIDE: 107 meq/L (ref 98–109)
CO2: 27 meq/L (ref 22–29)
CREATININE: 1 mg/dL (ref 0.6–1.1)
EGFR: 64 mL/min/{1.73_m2} — ABNORMAL LOW (ref >90)
GLUCOSE: 57 mg/dL — AB (ref 70–140)
POTASSIUM: 4.2 meq/L (ref 3.5–5.1)
SODIUM: 142 meq/L (ref 136–145)
Total Bilirubin: 0.3 mg/dL (ref 0.20–1.20)
Total Protein: 7 g/dL (ref 6.4–8.3)

## 2015-11-06 LAB — CBC WITH DIFFERENTIAL/PLATELET
BASO%: 0.8 % (ref 0.0–2.0)
Basophils Absolute: 0 10*3/uL (ref 0.0–0.1)
EOS%: 3.7 % (ref 0.0–7.0)
Eosinophils Absolute: 0.2 10*3/uL (ref 0.0–0.5)
HEMATOCRIT: 37.2 % (ref 34.8–46.6)
HGB: 12.2 g/dL (ref 11.6–15.9)
LYMPH#: 0.7 10*3/uL — AB (ref 0.9–3.3)
LYMPH%: 13.8 % — ABNORMAL LOW (ref 14.0–49.7)
MCH: 31.4 pg (ref 25.1–34.0)
MCHC: 32.8 g/dL (ref 31.5–36.0)
MCV: 95.6 fL (ref 79.5–101.0)
MONO#: 0.4 10*3/uL (ref 0.1–0.9)
MONO%: 7.4 % (ref 0.0–14.0)
NEUT#: 3.6 10*3/uL (ref 1.5–6.5)
NEUT%: 74.3 % (ref 38.4–76.8)
Platelets: 166 10*3/uL (ref 145–400)
RBC: 3.89 10*6/uL (ref 3.70–5.45)
RDW: 13.4 % (ref 11.2–14.5)
WBC: 4.9 10*3/uL (ref 3.9–10.3)

## 2015-11-06 MED ORDER — LAMICTAL 200 MG PO TABS: 100.0000 mg | ORAL_TABLET | Freq: Every day | ORAL | Status: DC

## 2015-11-06 MED ORDER — ACETAMINOPHEN 325 MG PO TABS
ORAL_TABLET | ORAL | Status: AC
  Filled 2015-11-06: qty 2

## 2015-11-06 MED ORDER — SODIUM CHLORIDE 0.9 % IV SOLN
Freq: Once | INTRAVENOUS | Status: AC
  Administered 2015-11-06: 10:00:00 via INTRAVENOUS

## 2015-11-06 MED ORDER — LIDOCAINE-PRILOCAINE 2.5-2.5 % EX CREA: TOPICAL_CREAM | CUTANEOUS | Status: DC

## 2015-11-06 MED ORDER — HEPARIN SOD (PORK) LOCK FLUSH 100 UNIT/ML IV SOLN
500.0000 [IU] | Freq: Once | INTRAVENOUS | Status: AC | PRN
  Administered 2015-11-06: 500 [IU]
  Filled 2015-11-06: qty 5

## 2015-11-06 MED ORDER — SODIUM CHLORIDE 0.9 % IJ SOLN
10.0000 mL | INTRAMUSCULAR | Status: DC | PRN
  Administered 2015-11-06: 10 mL
  Filled 2015-11-06: qty 10

## 2015-11-06 MED ORDER — DIPHENHYDRAMINE HCL 25 MG PO CAPS
ORAL_CAPSULE | ORAL | Status: AC
  Filled 2015-11-06: qty 1

## 2015-11-06 MED ORDER — ACETAMINOPHEN 325 MG PO TABS
650.0000 mg | ORAL_TABLET | Freq: Once | ORAL | Status: AC
  Administered 2015-11-06: 650 mg via ORAL

## 2015-11-06 MED ORDER — SODIUM CHLORIDE 0.9 % IV SOLN
6.0000 mg/kg | Freq: Once | INTRAVENOUS | Status: AC
  Administered 2015-11-06: 399 mg via INTRAVENOUS
  Filled 2015-11-06: qty 19

## 2015-11-06 MED ORDER — DIPHENHYDRAMINE HCL 25 MG PO CAPS
25.0000 mg | ORAL_CAPSULE | Freq: Once | ORAL | Status: AC
  Administered 2015-11-06: 25 mg via ORAL

## 2015-11-06 NOTE — Patient Instructions (Signed)
[facility] Discharge Instructions for Patients Receiving Chemotherapy  Today you received the following chemotherapy agents: Herceptin   If you develop nausea and vomiting that is not controlled by your nausea medication, call the clinic.   BELOW ARE SYMPTOMS THAT SHOULD BE REPORTED IMMEDIATELY:  *FEVER GREATER THAN 100.5 F  *CHILLS WITH OR WITHOUT FEVER  NAUSEA AND VOMITING THAT IS NOT CONTROLLED WITH YOUR NAUSEA MEDICATION  *UNUSUAL SHORTNESS OF BREATH  *UNUSUAL BRUISING OR BLEEDING  TENDERNESS IN MOUTH AND THROAT WITH OR WITHOUT PRESENCE OF ULCERS  *URINARY PROBLEMS  *BOWEL PROBLEMS  UNUSUAL RASH Items with * indicate a potential emergency and should be followed up as soon as possible.  Feel free to call the clinic should you have any questions or concerns. The clinic phone number is [phone].  Please show the CHEMO ALERT CARD at check-in to the Emergency Department and triage nurse.   

## 2015-11-06 NOTE — Telephone Encounter (Signed)
Appointments given and avs printed.

## 2015-11-06 NOTE — Progress Notes (Signed)
[place]  [phone] [phone]     ID: Lynise Porr Cox-Fishman DOB: [birth year]  MR#: [id number]  NIO#:[id number]  Patient Care Team: Rita Ohara, MD as PCP - General (Family Medicine) Chauncey Cruel, MD as Consulting Physician (Oncology) Thea Silversmith, MD as Consulting Physician (Radiation Oncology) Mauro Kaufmann, RN as Registered Nurse Rockwell Germany, RN as Registered Nurse Holley Bouche, NP as Nurse Practitioner (Nurse Practitioner) Autumn Messing III, MD as Consulting Physician (General Surgery) PCP: Vikki Ports, MD OTHER MD: Rutherford Guys, MD  CHIEF COMPLAINT: HER-2 positive breast cancer  CURRENT TREATMENT:  anti-HER-2 immunotherapy   BREAST CANCER HISTORY: From the original intake note:  "Rhonda Gardner" had not had a physical exam in quite some time, and had not had a mammogram since 2020, when she was evaluated by Dr. Tomi Bamberger 12/01/2014. 60 Dr. Tomi Bamberger was able to palpate a 2 cm firm mass at the 3:00 position in the left breast. The patient herself has been aware of multiple breast masses and does have a history of fibrocystic change, with multiple prior benign biopsies. She did not feel this particular mass had changed recently. 60   Dr. Tomi Bamberger arranged for bilateral diagnostic mammography and left breast ultrasonography at the [facility] Center 12/15/2014. This shows the breast density to be category C. In the left breast upper outer quadrant there was a bilobed mass measuring approximately 2 cm. This was palpable. Ultrasound confirmed an irregular microlobulated and hypoechoic mass measuring 2.1 cm at the 2:00 location 6 cm from the nipple. There was no left axillary adenopathy.  Biopsy of the mass in question 12/19/2014 showed ([id number]) invasive ductal carcinoma, grade 2 or 3, estrogen and progesterone receptor negative, with an MIB-1 of 38%, and equivocal HER-2 amplification, the signals ratio being 1.57, the number per cell 4.25.  On 12/27/2014 the  patient underwent bilateral breast MRI. The mass in question at the 2:00 location of the left breast measured 2.5 cm. It directly abuts the left pectoralis major. There is no enhancement in the muscle itself. There were no abnormal appearing lymph nodes. There was an incidentally noted 1 cm hepatic cyst at the dome of the right liver lobe.  Her subsequent history is as detailed below 60  INTERVAL HISTORY: Rhonda Gardner returns today for follow up of her left-sided breast cancer. The interval history is generally unremarkable. She continues on trastuzumab every 3 weeks, with excellent tolerance. We just obtained a repeat echocardiogram which shows a well preserved ejection fraction.  REVIEW OF SYSTEMS: Rhonda Gardner is having some blurred vision at times. This is due to accommodation issue secondary to chemotherapy. It is very, 1 and will resolve over the next several months. Of more concern is the visual changes she sometimes experiences on the left side. We obtained a brain MRI which was entirely benign. I think she really needs to see her ophthalmologist and I am copying him on this note. Otherwise, she has had no unusual headaches, dizziness, gait imbalance, nausea, vomiting, cough, phlegm production, or change in bowel or bladder habits. She occasionally has aches and pains particularly some elbow discomfort. She is exercising chiefly by walking. A detailed review of systems today was otherwise stable  PAST MEDICAL HISTORY: Past Medical History  Diagnosis Date  . Allergy 2006    chemical cauterization in spring 2006, and 2nd treatment with good results.(Dr.Shoemaker)  . Bipolar disorder ([place])   . Breast cancer ([place]) 12/2014    invasive ductal carcinoma (LEFT)  . Breast cancer of upper-outer quadrant  of left female breast ([place]) 12/22/2014  . PONV (postoperative nausea and vomiting)     PAST SURGICAL HISTORY: Past Surgical History  Procedure Laterality Date  . Breast biopsy Bilateral     benign and a right  stereotatic breast biopsy.  . Foot surgery Left 2014    Dr. Paulla Dolly  . Fibroid excision  age 24    prolapsed fibroid removed  . Radioactive seed guided mastectomy with axillary sentinel lymph node biopsy Left 05/25/2015    Procedure: LEFT BREAST LUMPECTOMY WITH RADIOACTIVE SEED AND SENTINEL LYMPH NODE BIOPSY;  Surgeon: Autumn Messing III, MD;  Location: [facility];  Service: General;  Laterality: Left;    FAMILY HISTORY Family History  Problem Relation Age of Onset  . Cancer Father     skin - non melanoma - farmer  . Heart disease Father   . Hypertension Mother   . Arthritis Mother     osteoarthritis  . Colon cancer Mother   . Macular degeneration Mother     wet and dry  . Cancer Mother 62    colon cancer at age 67  . Osteoporosis Sister     osteopenia  . Cancer Maternal Aunt     lung cancer  . Diabetes Paternal Aunt   . Cancer Paternal Aunt 73    ? stomach cancer  . Autism Sister   . Mental retardation Sister   . Blindness Sister     secondary to retrolental fibroplasia  . Goiter Paternal Aunt    the patient's father lived to be 62. He had a history of nonmelanoma skin cancers. [name] mother is living at age 43. She was diagnosed with colon cancer in her 42s. A paternal aunt may have had stomach cancer. Maternal aunt developed lung cancer in her 32s. The patient has one brother, 2 sisters. One of her sisters is blind and mentally retarded. 60 There is no history of breast or ovarian cancer in the family.  GYNECOLOGIC HISTORY:  No LMP recorded. Patient is postmenopausal. Menarche age 29. The patient is GX P0. She stopped having periods in her early 60s. She did not take hormone replacement. She took oral contraceptives remotely for over 5 years with no complications.  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 Her husband [name] worked in IT originally but now runs a family firm. It's just the 2 of them at home, plus a chocolate lab.    ADVANCED  DIRECTIVES: In place   HEALTH MAINTENANCE: Social History  Substance Use Topics  . Smoking status: Never Smoker   . Smokeless tobacco: Never Used  . Alcohol Use: No     Colonoscopy: In [place] under Dr. Lavina Hamman, date not entirely clear  PAP: February 2016  Bone density: Under [facility] Radon, results not currently available  Lipid panel:  Allergies  Allergen Reactions  . Adhesive [Tape] Rash    Pt is sensitive to any kind of adhesive tape products.    Current Outpatient Prescriptions  Medication Sig Dispense Refill  . Cholecalciferol (VITAMIN D) 2000 UNITS tablet Take 2,000 Units by mouth daily.    . Cyanocobalamin (VITAMIN B-12) 5000 MCG SUBL Place 5,000 mcg under the tongue daily.    Marland Kitchen LAMICTAL 200 MG tablet Take 0.5 tablets (100 mg total) by mouth daily. 90 tablet 0  . lidocaine-prilocaine (EMLA) cream Apply to affected area once 30 g 3  . LUTEIN-ZEAXANTHIN PO Take 1 tablet by mouth daily.    . Misc Natural Products (TART  CHERRY ADVANCED) CAPS Take 1 capsule by mouth daily.    . Multiple Vitamins-Minerals (PRESERVISION AREDS) CAPS Take 1 capsule by mouth 2 (two) times daily.    . naproxen sodium (ANAPROX) 220 MG tablet Take 220-440 mg by mouth daily as needed (pain). Aleve     No current facility-administered medications for this visit.    OBJECTIVE: Middle-aged white woman in no acute distress Filed Vitals:   11/06/15 0937  BP: 127/63  Pulse: 89  Temp: 98.6 F (37 C)  Resp: 18     Body mass index is 21.92 kg/(m^2).    ECOG FS:1 - Symptomatic but completely ambulatory   Sclerae unicteric, EOMs intact, pupils round and equal Oropharynx clear, dentition in good repair No cervical or supraclavicular adenopathy Lungs no rales or rhonchi Heart regular rate and rhythm Abd soft, nontender, positive bowel sounds MSK no focal spinal tenderness, no upper extremity lymphedema Neuro: nonfocal, well oriented, appropriate affect Breasts: The right breast is unremarkable.  The port is in place superiorly and is not erythematous or swollen. The left breast is status post lumpectomy and radiation. There is no evidence of local recurrence. The cosmetic result is excellent. The left axilla is benign.  LAB RESULTS:  CMP     Component Value Date/Time   NA 141 10/18/2015 0850   NA 142 01/13/2015 0822   K 4.7 10/18/2015 0850   K 3.8 01/13/2015 0822   CL 106 01/13/2015 0822   CO2 27 10/18/2015 0850   CO2 26 01/13/2015 0822   GLUCOSE 88 10/18/2015 0850   GLUCOSE 83 01/13/2015 0822   BUN 15.0 10/18/2015 0850   BUN 8 01/13/2015 0822   CREATININE 0.9 10/18/2015 0850   CREATININE 0.77 01/13/2015 0822   CREATININE 0.80 12/01/2014 1437   CALCIUM 9.1 10/18/2015 0850   CALCIUM 9.1 01/13/2015 0822   PROT 6.9 10/18/2015 0850   PROT 7.0 12/01/2014 1437   ALBUMIN 4.1 10/18/2015 0850   ALBUMIN 4.8 12/01/2014 1437   AST 28 10/18/2015 0850   AST 28 12/01/2014 1437   ALT 25 10/18/2015 0850   ALT 24 12/01/2014 1437   ALKPHOS 70 10/18/2015 0850   ALKPHOS 65 12/01/2014 1437   BILITOT 0.55 10/18/2015 0850   BILITOT 0.6 12/01/2014 1437   GFRNONAA 89* 01/13/2015 0822   GFRAA >90 01/13/2015 0822    INo results found for: SPEP, UPEP  Lab Results  Component Value Date   WBC 4.9 11/06/2015   NEUTROABS 3.6 11/06/2015   HGB 12.2 11/06/2015   HCT 37.2 11/06/2015   MCV 95.6 11/06/2015   PLT 166 11/06/2015      Chemistry      Component Value Date/Time   NA 141 10/18/2015 0850   NA 142 01/13/2015 0822   K 4.7 10/18/2015 0850   K 3.8 01/13/2015 0822   CL 106 01/13/2015 0822   CO2 27 10/18/2015 0850   CO2 26 01/13/2015 0822   BUN 15.0 10/18/2015 0850   BUN 8 01/13/2015 0822   CREATININE 0.9 10/18/2015 0850   CREATININE 0.77 01/13/2015 0822   CREATININE 0.80 12/01/2014 1437      Component Value Date/Time   CALCIUM 9.1 10/18/2015 0850   CALCIUM 9.1 01/13/2015 0822   ALKPHOS 70 10/18/2015 0850   ALKPHOS 65 12/01/2014 1437   AST 28 10/18/2015 0850   AST 28  12/01/2014 1437   ALT 25 10/18/2015 0850   ALT 24 12/01/2014 1437   BILITOT 0.55 10/18/2015 0850   BILITOT 0.6 12/01/2014 1437  No results found for: LABCA2  No components found for: [id number]  No results for input(s): INR in the last 168 hours.  Urinalysis    Component Value Date/Time   LABSPEC 1.005 04/03/2015 1451   LABSPEC 1.010 08/09/2007 2121   PHURINE 7.5 04/03/2015 1451   PHURINE 7.5 08/09/2007 2121   GLUCOSEU Negative 04/03/2015 1451   GLUCOSEU NEGATIVE 08/09/2007 2121   HGBUR Trace 04/03/2015 1451   HGBUR SMALL* 08/09/2007 2121   BILIRUBINUR Negative 04/03/2015 1451   BILIRUBINUR neg 12/01/2014 [place] 08/09/2007 2121   KETONESUR Negative 04/03/2015 [place] 08/09/2007 2121   PROTEINUR Negative 04/03/2015 1451   PROTEINUR neg 12/01/2014 1358   PROTEINUR NEGATIVE 08/09/2007 2121   UROBILINOGEN 0.2 04/03/2015 1451   UROBILINOGEN negative 12/01/2014 1358   UROBILINOGEN 0.2 08/09/2007 2121   NITRITE Negative 04/03/2015 1451   NITRITE neg 12/01/2014 1358   NITRITE NEGATIVE 08/09/2007 2121   LEUKOCYTESUR Small 04/03/2015 1451   LEUKOCYTESUR Negative 12/01/2014 1358    STUDIES: Mr Jeri Cos Wo Contrast  November 09, 2015  CLINICAL DATA:  Persistent visual disturbance of the left eye. Personal history of breast cancer. EXAM: MRI HEAD WITHOUT AND WITH CONTRAST TECHNIQUE: Multiplanar, multiecho pulse sequences of the brain and surrounding structures were obtained without and with intravenous contrast. CONTRAST:  29m MULTIHANCE GADOBENATE DIMEGLUMINE 529 MG/ML IV SOLN COMPARISON:  None. FINDINGS: The brain has normal appearance on all pulse sequences without evidence of malformation, atrophy, old or acute infarction, mass lesion, hemorrhage, hydrocephalus or extra-axial collection. No pituitary mass. No orbital lesion is visible. No skull or skullbase lesion. There are mucosal inflammatory changes of the maxillary sinuses with a few small  retention cysts. IMPRESSION: No cause of the presenting symptoms is identified. Normal appearance of the brain. No orbital or optic pathway abnormality seen. Mild mucosal inflammation of the maxillary sinuses. Electronically Signed   By: MNelson ChimesM.D.   On: [id number]:00    ASSESSMENT: 60y.o. 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  Left breast boost/ 16 Gy at 2 Gy per fraction x 8 fractions  (4) genetics counselor felt the risk of a familial mutation was sufficiently low testing could be omitted  (5) continuing trastuzumab every 3 weeks to complete a year (through March 2017)  (a) most recent echocardiogram 10/11/2015 shows an ejection fraction in the 65%-70% range  (6) left eye visual disturbance with negative brain MRI with and without contrast 101/26/17 PLAN: PNevin Bloodgoodcontinues to recover well from her chemotherapy. She will receive her trastuzumab today and again every 3 weeks through mid March.  She has some accommodation issues regarding her vision which are very common after chemotherapy and which are self-limiting. They usually clear a few months after chemotherapy stops.  I don't have any explanation though for  the other visual changes C occasionally notices involving her left eye. We did a brain MRI which was fine. I think she really needs a good retinal exam and she is going to call Dr. Gershon Crane her eye doctor to get that set up in the near future.  Otherwise we will do one last echo after she completes her trastuzumab and she will return to see me in April. At that time we will start routine follow-up.  She knows to call for any problems that may develop before the next visit here.  Chauncey Cruel, MD   11/06/2015 9:54 AM

## 2015-11-07 ENCOUNTER — Ambulatory Visit: Payer: BLUE CROSS/BLUE SHIELD

## 2015-11-07 ENCOUNTER — Other Ambulatory Visit: Payer: BLUE CROSS/BLUE SHIELD

## 2015-11-07 ENCOUNTER — Telehealth: Payer: Self-pay | Admitting: Oncology

## 2015-11-07 ENCOUNTER — Ambulatory Visit: Payer: BLUE CROSS/BLUE SHIELD | Admitting: Oncology

## 2015-11-07 NOTE — Telephone Encounter (Signed)
Added additional lab/inf appointments for 3/6 and 3/27. Confirmed with patient appointments for 2/13 - 3/6 - 3/27 - 4/10 and 4/17. Cameron working on echo patient aware she will be contacted re echo closed to 4/4.

## 2015-11-27 ENCOUNTER — Other Ambulatory Visit (HOSPITAL_BASED_OUTPATIENT_CLINIC_OR_DEPARTMENT_OTHER): Payer: BLUE CROSS/BLUE SHIELD

## 2015-11-27 ENCOUNTER — Encounter: Payer: Self-pay | Admitting: *Deleted

## 2015-11-27 ENCOUNTER — Ambulatory Visit (HOSPITAL_BASED_OUTPATIENT_CLINIC_OR_DEPARTMENT_OTHER): Payer: BLUE CROSS/BLUE SHIELD

## 2015-11-27 VITALS — BP 128/79 | HR 69 | Temp 98.5°F | Resp 18

## 2015-11-27 DIAGNOSIS — C50412 Malignant neoplasm of upper-outer quadrant of left female breast: Secondary | ICD-10-CM

## 2015-11-27 DIAGNOSIS — Z5112 Encounter for antineoplastic immunotherapy: Secondary | ICD-10-CM

## 2015-11-27 LAB — CBC WITH DIFFERENTIAL/PLATELET
BASO%: 1.3 % (ref 0.0–2.0)
Basophils Absolute: 0 10*3/uL (ref 0.0–0.1)
EOS%: 5.1 % (ref 0.0–7.0)
Eosinophils Absolute: 0.2 10*3/uL (ref 0.0–0.5)
HCT: 36.6 % (ref 34.8–46.6)
HGB: 12.2 g/dL (ref 11.6–15.9)
LYMPH%: 17.5 % (ref 14.0–49.7)
MCH: 31.7 pg (ref 25.1–34.0)
MCHC: 33.2 g/dL (ref 31.5–36.0)
MCV: 95.2 fL (ref 79.5–101.0)
MONO#: 0.4 10*3/uL (ref 0.1–0.9)
MONO%: 10.3 % (ref 0.0–14.0)
NEUT%: 65.8 % (ref 38.4–76.8)
NEUTROS ABS: 2.5 10*3/uL (ref 1.5–6.5)
Platelets: 189 10*3/uL (ref 145–400)
RBC: 3.84 10*6/uL (ref 3.70–5.45)
RDW: 13.4 % (ref 11.2–14.5)
WBC: 3.7 10*3/uL — AB (ref 3.9–10.3)
lymph#: 0.7 10*3/uL — ABNORMAL LOW (ref 0.9–3.3)

## 2015-11-27 LAB — COMPREHENSIVE METABOLIC PANEL
ALT: 19 U/L (ref 0–55)
ANION GAP: 7 meq/L (ref 3–11)
AST: 25 U/L (ref 5–34)
Albumin: 4.1 g/dL (ref 3.5–5.0)
Alkaline Phosphatase: 78 U/L (ref 40–150)
BILIRUBIN TOTAL: 0.3 mg/dL (ref 0.20–1.20)
BUN: 13 mg/dL (ref 7.0–26.0)
CALCIUM: 8.8 mg/dL (ref 8.4–10.4)
CHLORIDE: 104 meq/L (ref 98–109)
CO2: 28 meq/L (ref 22–29)
CREATININE: 0.8 mg/dL (ref 0.6–1.1)
EGFR: 77 mL/min/{1.73_m2} — AB (ref >90)
Glucose: 83 mg/dl (ref 70–140)
Potassium: 4.6 mEq/L (ref 3.5–5.1)
SODIUM: 139 meq/L (ref 136–145)
Total Protein: 6.7 g/dL (ref 6.4–8.3)

## 2015-11-27 MED ORDER — DIPHENHYDRAMINE HCL 25 MG PO CAPS
25.0000 mg | ORAL_CAPSULE | Freq: Once | ORAL | Status: AC
  Administered 2015-11-27: 25 mg via ORAL

## 2015-11-27 MED ORDER — SODIUM CHLORIDE 0.9 % IV SOLN
Freq: Once | INTRAVENOUS | Status: AC
  Administered 2015-11-27: 11:00:00 via INTRAVENOUS

## 2015-11-27 MED ORDER — HEPARIN SOD (PORK) LOCK FLUSH 100 UNIT/ML IV SOLN
500.0000 [IU] | Freq: Once | INTRAVENOUS | Status: AC | PRN
  Administered 2015-11-27: 500 [IU]
  Filled 2015-11-27: qty 5

## 2015-11-27 MED ORDER — SODIUM CHLORIDE 0.9 % IJ SOLN
10.0000 mL | INTRAMUSCULAR | Status: DC | PRN
  Administered 2015-11-27: 10 mL
  Filled 2015-11-27: qty 10

## 2015-11-27 MED ORDER — ACETAMINOPHEN 325 MG PO TABS
650.0000 mg | ORAL_TABLET | Freq: Once | ORAL | Status: AC
  Administered 2015-11-27: 650 mg via ORAL

## 2015-11-27 MED ORDER — ACETAMINOPHEN 325 MG PO TABS
ORAL_TABLET | ORAL | Status: AC
  Filled 2015-11-27: qty 2

## 2015-11-27 MED ORDER — DIPHENHYDRAMINE HCL 25 MG PO CAPS
ORAL_CAPSULE | ORAL | Status: AC
  Filled 2015-11-27: qty 1

## 2015-11-27 MED ORDER — TRASTUZUMAB CHEMO INJECTION 440 MG
6.0000 mg/kg | Freq: Once | INTRAVENOUS | Status: AC
  Administered 2015-11-27: 399 mg via INTRAVENOUS
  Filled 2015-11-27: qty 19

## 2015-11-27 NOTE — Patient Instructions (Signed)
[facility] Discharge Instructions for Patients Receiving Chemotherapy  Today you received the following chemotherapy agents: Herceptin   To help prevent nausea and vomiting after your treatment, we encourage you to take your nausea medication as directed.    If you develop nausea and vomiting that is not controlled by your nausea medication, call the clinic.   BELOW ARE SYMPTOMS THAT SHOULD BE REPORTED IMMEDIATELY:  *FEVER GREATER THAN 100.5 F  *CHILLS WITH OR WITHOUT FEVER  NAUSEA AND VOMITING THAT IS NOT CONTROLLED WITH YOUR NAUSEA MEDICATION  *UNUSUAL SHORTNESS OF BREATH  *UNUSUAL BRUISING OR BLEEDING  TENDERNESS IN MOUTH AND THROAT WITH OR WITHOUT PRESENCE OF ULCERS  *URINARY PROBLEMS  *BOWEL PROBLEMS  UNUSUAL RASH Items with * indicate a potential emergency and should be followed up as soon as possible.  Feel free to call the clinic you have any questions or concerns. The clinic phone number is [phone].  Please show the CHEMO ALERT CARD at check-in to the Emergency Department and triage nurse.   

## 2015-11-28 ENCOUNTER — Other Ambulatory Visit: Payer: BLUE CROSS/BLUE SHIELD

## 2015-11-28 ENCOUNTER — Ambulatory Visit: Payer: BLUE CROSS/BLUE SHIELD

## 2015-12-01 ENCOUNTER — Other Ambulatory Visit: Payer: Self-pay | Admitting: Oncology

## 2015-12-01 ENCOUNTER — Telehealth: Payer: Self-pay | Admitting: Oncology

## 2015-12-01 NOTE — Telephone Encounter (Signed)
appt made and avs printed °

## 2015-12-06 ENCOUNTER — Encounter: Payer: Self-pay | Admitting: Family Medicine

## 2015-12-07 ENCOUNTER — Ambulatory Visit: Payer: BLUE CROSS/BLUE SHIELD | Admitting: Oncology

## 2015-12-12 ENCOUNTER — Other Ambulatory Visit: Payer: Self-pay | Admitting: *Deleted

## 2015-12-12 ENCOUNTER — Telehealth: Payer: Self-pay | Admitting: *Deleted

## 2015-12-12 DIAGNOSIS — C50412 Malignant neoplasm of upper-outer quadrant of left female breast: Secondary | ICD-10-CM

## 2015-12-12 DIAGNOSIS — N63 Unspecified lump in unspecified breast: Secondary | ICD-10-CM

## 2015-12-12 NOTE — Telephone Encounter (Signed)
"  I fell and have a seroma on my breast.  It's tender and not any better.  I need to be seen sooner or have an U/S befor I se Him on Friday.  Return number [phone]."  Will notify MD for any US/test orders.  Not available today but Wed/Thursday can have test.

## 2015-12-12 NOTE — Telephone Encounter (Signed)
This RN spoke with pt and sent STAT orders for mammo and [place]

## 2015-12-12 NOTE — Telephone Encounter (Signed)
This RN spoke with pt her concern- obtained orders for mammo and U/S of area for evaluation.  Placed STAT - per pt will see MD this Friday.

## 2015-12-14 ENCOUNTER — Other Ambulatory Visit: Payer: Self-pay | Admitting: Oncology

## 2015-12-14 DIAGNOSIS — C50412 Malignant neoplasm of upper-outer quadrant of left female breast: Secondary | ICD-10-CM

## 2015-12-15 ENCOUNTER — Ambulatory Visit (HOSPITAL_BASED_OUTPATIENT_CLINIC_OR_DEPARTMENT_OTHER): Payer: BLUE CROSS/BLUE SHIELD | Admitting: Oncology

## 2015-12-15 VITALS — BP 114/74 | HR 77 | Temp 97.9°F | Resp 18 | Ht 68.0 in | Wt 145.0 lb

## 2015-12-15 DIAGNOSIS — C50412 Malignant neoplasm of upper-outer quadrant of left female breast: Secondary | ICD-10-CM

## 2015-12-15 DIAGNOSIS — Z171 Estrogen receptor negative status [ER-]: Secondary | ICD-10-CM | POA: Diagnosis not present

## 2015-12-15 NOTE — Progress Notes (Signed)
[place]  [phone] [phone]) [phone]     ID: Shalayah Beagley Cox-Fishman DOB: 1956-05-23  MR#: [id number]  ZJI#:[id number]  Patient Care Team: Rita Ohara, MD as PCP - General (Family Medicine) Chauncey Cruel, MD as Consulting Physician (Oncology) Thea Silversmith, MD as Consulting Physician (Radiation Oncology) Mauro Kaufmann, RN as Registered Nurse Rockwell Germany, RN as Registered Nurse Holley Bouche, NP as Nurse Practitioner (Nurse Practitioner) Autumn Messing III, MD as Consulting Physician (General Surgery) PCP: Vikki Ports, MD OTHER MD: Rutherford Guys, MD  CHIEF COMPLAINT: HER-2 positive breast cancer  CURRENT TREATMENT:  anti-HER-2 immunotherapy   BREAST CANCER HISTORY: From the original intake note:  "Rhonda Gardner" had not had a physical exam in quite some time, and had not had a mammogram since 2020, when she was evaluated by Dr. Tomi Bamberger 12/01/2014. Dr. Tomi Bamberger was able to palpate a 2 cm firm mass at the 3:00 position in the left breast. The patient herself has been aware of multiple breast masses and does have a history of fibrocystic change, with multiple prior benign biopsies. She did not feel this particular mass had changed recently.   Dr. Tomi Bamberger arranged for bilateral diagnostic mammography and left breast ultrasonography at the breast Center 12/15/2014. This shows the breast density to be category C. In the left breast upper outer quadrant there was a bilobed mass measuring approximately 2 cm. This was palpable. Ultrasound confirmed an irregular microlobulated and hypoechoic mass measuring 2.1 cm at the 2:00 location 6 cm from the nipple. There was no left axillary adenopathy.  Biopsy of the mass in question 12/19/2014 showed ([id number]) invasive ductal carcinoma, grade 2 or 3, estrogen and progesterone receptor negative, with an MIB-1 of 38%, and equivocal HER-2 amplification, the signals ratio being 1.57, the number per cell 4.25.  On 12/27/2014 the  patient underwent bilateral breast MRI. The mass in question at the 2:00 location of the left breast measured 2.5 cm. It directly abuts the left pectoralis major. There is no enhancement in the muscle itself. There were no abnormal appearing lymph nodes. There was an incidentally noted 1 cm hepatic cyst at the dome of the right liver lobe.  Her subsequent history is as detailed below  INTERVAL HISTORY: Rhonda Gardner returns today accompanied by her husband [name] for further follow-up of her left breast "lump". Recall this developed after trauma from a fall. We have been waiting to see whether it resolves spontaneously, but so far it has not. It continues to cause her discomfort, wakes her up at night, and she is very concerned that she is about to start a trip later this month and she does not want to have to deal with this problem or any of its possible complications while that is going on.  As far as her breast cancer treatment is concerned she continues on Herceptin. She will receive her last dose on 01/08/2016  REVIEW OF SYSTEMS: Aside from the left breast problem, Rhonda Gardner is doing well. She continues to have problems with her vision but we have already evaluated that and her brain MRI was fine. She has some arthritis issues here and there but they are not more persistent or intense than before. He detailed review of systems today was otherwise stable  PAST MEDICAL HISTORY: Past Medical History  Diagnosis Date  . Allergy 2006    chemical cauterization in spring 2006, and 2nd treatment with good results.(Dr.Shoemaker)  . Bipolar disorder ([place])   . Breast cancer ([place]) 12/2014  invasive ductal carcinoma (LEFT)  . Breast cancer of upper-outer quadrant of left female breast ([place]) 12/22/2014  . PONV (postoperative nausea and vomiting)     PAST SURGICAL HISTORY: Past Surgical History  Procedure Laterality Date  . Breast biopsy Bilateral     benign and a right stereotatic breast biopsy.  . Foot surgery  Left 2014    Dr. Paulla Dolly  . Fibroid excision  age 7    prolapsed fibroid removed  . Radioactive seed guided mastectomy with axillary sentinel lymph node biopsy Left 05/25/2015    Procedure: LEFT BREAST LUMPECTOMY WITH RADIOACTIVE SEED AND SENTINEL LYMPH NODE BIOPSY;  Surgeon: Autumn Messing III, MD;  Location: [facility];  Service: General;  Laterality: Left;    FAMILY HISTORY Family History  Problem Relation Age of Onset  . Cancer Father     skin - non melanoma - farmer  . Heart disease Father   . Hypertension Mother   . Arthritis Mother     osteoarthritis  . Colon cancer Mother   . Macular degeneration Mother     wet and dry  . Cancer Mother 62    colon cancer at age 62  . Osteoporosis Sister     osteopenia  . Cancer Maternal Aunt     lung cancer  . Diabetes Paternal Aunt   . Cancer Paternal Aunt 50    ? stomach cancer  . Autism Sister   . Mental retardation Sister   . Blindness Sister     secondary to retrolental fibroplasia  . Goiter Paternal Aunt    the patient's father lived to be 95. He had a history of nonmelanoma skin cancers. [name] mother is living at age 61. She was diagnosed with colon cancer in her 62s. A paternal aunt may have had stomach cancer. Maternal aunt developed lung cancer in her 40s. The patient has one brother, 2 sisters. One of her sisters is blind and mentally retarded. There is no history of breast or ovarian cancer in the family.  GYNECOLOGIC HISTORY:  No LMP recorded. Patient is postmenopausal. Menarche age 47. The patient is GX P0. She stopped having periods in her early 60s. She did not take hormone replacement. She took oral contraceptives remotely for over 5 years with no complications.  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 Her husband [name] worked in IT originally but now runs a family firm. It's just the 2 of them at home, plus a chocolate lab.    ADVANCED DIRECTIVES: In place   HEALTH  MAINTENANCE: Social History  Substance Use Topics  . Smoking status: Never Smoker   . Smokeless tobacco: Never Used  . Alcohol Use: No     Colonoscopy: In [place] under Dr. Lavina Hamman, date not entirely clear  PAP: February 2016  Bone density: Under [facility] Radon, results not currently available  Lipid panel:  Allergies  Allergen Reactions  . Adhesive [Tape] Rash    Pt is sensitive to any kind of adhesive tape products.    Current Outpatient Prescriptions  Medication Sig Dispense Refill  . Cholecalciferol (VITAMIN D) 2000 UNITS tablet Take 2,000 Units by mouth daily.    . Cyanocobalamin (VITAMIN B-12) 5000 MCG SUBL Place 5,000 mcg under the tongue daily.    Marland Kitchen LAMICTAL 200 MG tablet Take 0.5 tablets (100 mg total) by mouth daily. 90 tablet 0  . lidocaine-prilocaine (EMLA) cream Apply to affected area once 30 g 3  . LUTEIN-ZEAXANTHIN PO Take 1 tablet  by mouth daily.    . Misc Natural Products (TART CHERRY ADVANCED) CAPS Take 1 capsule by mouth daily.    . Multiple Vitamins-Minerals (PRESERVISION AREDS) CAPS Take 1 capsule by mouth 2 (two) times daily.    . naproxen sodium (ANAPROX) 220 MG tablet Take 220-440 mg by mouth daily as needed (pain). Aleve     No current facility-administered medications for this visit.    OBJECTIVE: Middle-aged white woman who appears stated age 30 Vitals:   12/15/15 1320  BP: 114/74  Pulse: 77  Temp: 97.9 F (36.6 C)  Resp: 18     Body mass index is 22.05 kg/(m^2).    ECOG FS:1 - Symptomatic but completely ambulatory   Sclerae unicteric, EOMs intact Oropharynx clear, dentition in good repair No cervical or supraclavicular adenopathy Lungs no rales or rhonchi Heart regular rate and rhythm Abd soft, nontender, positive bowel sounds MSK no focal spinal tenderness, no Left upper extremity lymphedema Neuro: nonfocal, well oriented, appropriate affect Breasts: The right breast is unremarkable. The mass in the left breast is easily palpable  on the lateral side. There is minimal tenderness. There is no erythema. The left axilla is benign.   LAB RESULTS:  CMP     Component Value Date/Time   NA 139 11/27/2015 0927   NA 142 01/13/2015 0822   K 4.6 11/27/2015 0927   K 3.8 01/13/2015 0822   CL 106 01/13/2015 0822   CO2 28 11/27/2015 0927   CO2 26 01/13/2015 0822   GLUCOSE 83 11/27/2015 0927   GLUCOSE 83 01/13/2015 0822   BUN 13.0 11/27/2015 0927   BUN 8 01/13/2015 0822   CREATININE 0.8 11/27/2015 0927   CREATININE 0.77 01/13/2015 0822   CREATININE 0.80 12/01/2014 1437   CALCIUM 8.8 11/27/2015 0927   CALCIUM 9.1 01/13/2015 0822   PROT 6.7 11/27/2015 0927   PROT 7.0 12/01/2014 1437   ALBUMIN 4.1 11/27/2015 0927   ALBUMIN 4.8 12/01/2014 1437   AST 25 11/27/2015 0927   AST 28 12/01/2014 1437   ALT 19 11/27/2015 0927   ALT 24 12/01/2014 1437   ALKPHOS 78 11/27/2015 0927   ALKPHOS 65 12/01/2014 1437   BILITOT 0.30 11/27/2015 0927   BILITOT 0.6 12/01/2014 1437   GFRNONAA 89* 01/13/2015 0822   GFRAA >90 01/13/2015 0822    INo results found for: SPEP, UPEP  Lab Results  Component Value Date   WBC 3.7* 11/27/2015   NEUTROABS 2.5 11/27/2015   HGB 12.2 11/27/2015   HCT 36.6 11/27/2015   MCV 95.2 11/27/2015   PLT 189 11/27/2015      Chemistry      Component Value Date/Time   NA 139 11/27/2015 0927   NA 142 01/13/2015 0822   K 4.6 11/27/2015 0927   K 3.8 01/13/2015 0822   CL 106 01/13/2015 0822   CO2 28 11/27/2015 0927   CO2 26 01/13/2015 0822   BUN 13.0 11/27/2015 0927   BUN 8 01/13/2015 0822   CREATININE 0.8 11/27/2015 0927   CREATININE 0.77 01/13/2015 0822   CREATININE 0.80 12/01/2014 1437      Component Value Date/Time   CALCIUM 8.8 11/27/2015 0927   CALCIUM 9.1 01/13/2015 0822   ALKPHOS 78 11/27/2015 0927   ALKPHOS 65 12/01/2014 1437   AST 25 11/27/2015 0927   AST 28 12/01/2014 1437   ALT 19 11/27/2015 0927   ALT 24 12/01/2014 1437   BILITOT 0.30 11/27/2015 0927   BILITOT 0.6 12/01/2014  1437  No results found for: LABCA2  No components found for: WTGRM301  No results for input(s): INR in the last 168 hours.  Urinalysis    Component Value Date/Time   LABSPEC 1.005 04/03/2015 1451   LABSPEC 1.010 08/09/2007 2121   PHURINE 7.5 04/03/2015 1451   PHURINE 7.5 08/09/2007 2121   GLUCOSEU Negative 04/03/2015 1451   GLUCOSEU NEGATIVE 08/09/2007 2121   HGBUR Trace 04/03/2015 1451   HGBUR SMALL* 08/09/2007 2121   BILIRUBINUR Negative 04/03/2015 1451   BILIRUBINUR neg 12/01/2014 [place] 08/09/2007 2121   KETONESUR Negative 04/03/2015 [place] 08/09/2007 2121   PROTEINUR Negative 04/03/2015 1451   PROTEINUR neg 12/01/2014 [place] 08/09/2007 2121   UROBILINOGEN 0.2 04/03/2015 1451   UROBILINOGEN negative 12/01/2014 1358   UROBILINOGEN 0.2 08/09/2007 2121   NITRITE Negative 04/03/2015 1451   NITRITE neg 12/01/2014 1358   NITRITE NEGATIVE 08/09/2007 2121   LEUKOCYTESUR Small 04/03/2015 1451   LEUKOCYTESUR Negative 12/01/2014 1358    STUDIES: No results found.  ASSESSMENT: 61 y.o. 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  Left breast boost/ 16  Gy at 2 Gy per fraction x 8 fractions  (4) genetics counselor felt the risk of a familial mutation was sufficiently low testing could be omitted  (5) continuing trastuzumab every 3 weeks to complete a year (through March 2017)  (a) most recent echocardiogram 10/11/2015 shows an ejection fraction in the 65%-70% range  (6) left eye visual disturbance with negative brain MRI with and without contrast 10/12/2015  PLAN: Paula's Seroma or hematoma is the same. It still bothers her. It is going to get in the way of her coming medication and in any case it needs to be addressed.  I have discussed this case with Dr. Isaiah Blakes and she will see the patient on 12/19/2015 for bilateral diagnostic mammography with tomography, which was in any case due, as well as a left breast ultrasound. I will see if Dr. Marlou Starks can see her shortly after that so he can either drain the fluid collection or take other measures he feels may be appropriate so this issue can be settled before she leaves on her cruise 12/25/2015.  Otherwise she has 2 more cycles of Herceptin, and will complete those treatments later this month. She has an appointment with me in April. She knows to call for any problems that may develop before her next visit here.  Chauncey Cruel, MD   12/16/2015 4:52 PM

## 2015-12-18 ENCOUNTER — Encounter: Payer: Self-pay | Admitting: *Deleted

## 2015-12-18 ENCOUNTER — Other Ambulatory Visit (HOSPITAL_BASED_OUTPATIENT_CLINIC_OR_DEPARTMENT_OTHER): Payer: BLUE CROSS/BLUE SHIELD

## 2015-12-18 ENCOUNTER — Ambulatory Visit (HOSPITAL_BASED_OUTPATIENT_CLINIC_OR_DEPARTMENT_OTHER): Payer: BLUE CROSS/BLUE SHIELD

## 2015-12-18 VITALS — BP 117/51 | HR 84 | Temp 98.0°F | Resp 18

## 2015-12-18 DIAGNOSIS — C50412 Malignant neoplasm of upper-outer quadrant of left female breast: Secondary | ICD-10-CM

## 2015-12-18 DIAGNOSIS — Z5112 Encounter for antineoplastic immunotherapy: Secondary | ICD-10-CM

## 2015-12-18 LAB — CBC WITH DIFFERENTIAL/PLATELET
BASO%: 0.7 % (ref 0.0–2.0)
BASOS ABS: 0 10*3/uL (ref 0.0–0.1)
EOS%: 2.3 % (ref 0.0–7.0)
Eosinophils Absolute: 0.1 10*3/uL (ref 0.0–0.5)
HCT: 39 % (ref 34.8–46.6)
HEMOGLOBIN: 13 g/dL (ref 11.6–15.9)
LYMPH%: 13.4 % — ABNORMAL LOW (ref 14.0–49.7)
MCH: 31.6 pg (ref 25.1–34.0)
MCHC: 33.3 g/dL (ref 31.5–36.0)
MCV: 94.7 fL (ref 79.5–101.0)
MONO#: 0.3 10*3/uL (ref 0.1–0.9)
MONO%: 5.5 % (ref 0.0–14.0)
NEUT#: 4.8 10*3/uL (ref 1.5–6.5)
NEUT%: 78.1 % — ABNORMAL HIGH (ref 38.4–76.8)
Platelets: 196 10*3/uL (ref 145–400)
RBC: 4.12 10*6/uL (ref 3.70–5.45)
RDW: 12.9 % (ref 11.2–14.5)
WBC: 6.1 10*3/uL (ref 3.9–10.3)
lymph#: 0.8 10*3/uL — ABNORMAL LOW (ref 0.9–3.3)

## 2015-12-18 LAB — COMPREHENSIVE METABOLIC PANEL
ALBUMIN: 4.5 g/dL (ref 3.5–5.0)
ALK PHOS: 71 U/L (ref 40–150)
ALT: 18 U/L (ref 0–55)
ANION GAP: 10 meq/L (ref 3–11)
AST: 25 U/L (ref 5–34)
BILIRUBIN TOTAL: 0.38 mg/dL (ref 0.20–1.20)
BUN: 23.2 mg/dL (ref 7.0–26.0)
CALCIUM: 9.2 mg/dL (ref 8.4–10.4)
CO2: 27 mEq/L (ref 22–29)
CREATININE: 0.9 mg/dL (ref 0.6–1.1)
Chloride: 104 mEq/L (ref 98–109)
EGFR: 65 mL/min/{1.73_m2} — AB (ref >90)
Glucose: 112 mg/dl (ref 70–140)
Potassium: 4.6 mEq/L (ref 3.5–5.1)
Sodium: 141 mEq/L (ref 136–145)
TOTAL PROTEIN: 7.3 g/dL (ref 6.4–8.3)

## 2015-12-18 MED ORDER — TRASTUZUMAB CHEMO INJECTION 440 MG
6.0000 mg/kg | Freq: Once | INTRAVENOUS | Status: AC
  Administered 2015-12-18: 399 mg via INTRAVENOUS
  Filled 2015-12-18: qty 19

## 2015-12-18 MED ORDER — SODIUM CHLORIDE 0.9 % IJ SOLN
10.0000 mL | INTRAMUSCULAR | Status: DC | PRN
  Administered 2015-12-18: 10 mL
  Filled 2015-12-18: qty 10

## 2015-12-18 MED ORDER — SODIUM CHLORIDE 0.9 % IV SOLN
Freq: Once | INTRAVENOUS | Status: AC
  Administered 2015-12-18: 11:00:00 via INTRAVENOUS

## 2015-12-18 MED ORDER — ACETAMINOPHEN 325 MG PO TABS
650.0000 mg | ORAL_TABLET | Freq: Once | ORAL | Status: AC
  Administered 2015-12-18: 650 mg via ORAL

## 2015-12-18 MED ORDER — ACETAMINOPHEN 325 MG PO TABS
ORAL_TABLET | ORAL | Status: AC
  Filled 2015-12-18: qty 2

## 2015-12-18 MED ORDER — DIPHENHYDRAMINE HCL 25 MG PO CAPS
25.0000 mg | ORAL_CAPSULE | Freq: Once | ORAL | Status: AC
  Administered 2015-12-18: 25 mg via ORAL

## 2015-12-18 MED ORDER — DIPHENHYDRAMINE HCL 25 MG PO CAPS
ORAL_CAPSULE | ORAL | Status: AC
  Filled 2015-12-18: qty 1

## 2015-12-18 MED ORDER — HEPARIN SOD (PORK) LOCK FLUSH 100 UNIT/ML IV SOLN
500.0000 [IU] | Freq: Once | INTRAVENOUS | Status: AC | PRN
  Administered 2015-12-18: 500 [IU]
  Filled 2015-12-18: qty 5

## 2015-12-18 NOTE — Patient Instructions (Signed)
[facility] Discharge Instructions for Patients Receiving Chemotherapy  Today you received the following chemotherapy agents, Herceptin.  To help prevent nausea and vomiting after your treatment, we encourage you to take your nausea medication as prescribed.   If you develop nausea and vomiting that is not controlled by your nausea medication, call the clinic.   BELOW ARE SYMPTOMS THAT SHOULD BE REPORTED IMMEDIATELY:  *FEVER GREATER THAN 100.5 F  *CHILLS WITH OR WITHOUT FEVER  NAUSEA AND VOMITING THAT IS NOT CONTROLLED WITH YOUR NAUSEA MEDICATION  *UNUSUAL SHORTNESS OF BREATH  *UNUSUAL BRUISING OR BLEEDING  TENDERNESS IN MOUTH AND THROAT WITH OR WITHOUT PRESENCE OF ULCERS  *URINARY PROBLEMS  *BOWEL PROBLEMS  UNUSUAL RASH Items with * indicate a potential emergency and should be followed up as soon as possible.  Feel free to call the clinic you have any questions or concerns. The clinic phone number is [phone].  Please show the CHEMO ALERT CARD at check-in to the Emergency Department and triage nurse.   

## 2015-12-19 ENCOUNTER — Other Ambulatory Visit: Payer: Self-pay | Admitting: Radiology

## 2015-12-19 DIAGNOSIS — N6489 Other specified disorders of breast: Secondary | ICD-10-CM

## 2015-12-19 HISTORY — DX: Other specified disorders of breast: N64.89

## 2015-12-19 LAB — HM MAMMOGRAPHY

## 2015-12-21 ENCOUNTER — Encounter: Payer: Self-pay | Admitting: *Deleted

## 2015-12-28 ENCOUNTER — Encounter: Payer: Self-pay | Admitting: *Deleted

## 2016-01-08 ENCOUNTER — Ambulatory Visit (HOSPITAL_BASED_OUTPATIENT_CLINIC_OR_DEPARTMENT_OTHER): Payer: BLUE CROSS/BLUE SHIELD

## 2016-01-08 ENCOUNTER — Encounter: Payer: Self-pay | Admitting: *Deleted

## 2016-01-08 ENCOUNTER — Other Ambulatory Visit (HOSPITAL_BASED_OUTPATIENT_CLINIC_OR_DEPARTMENT_OTHER): Payer: BLUE CROSS/BLUE SHIELD

## 2016-01-08 VITALS — BP 114/47 | HR 77 | Temp 98.8°F | Resp 19

## 2016-01-08 DIAGNOSIS — C50412 Malignant neoplasm of upper-outer quadrant of left female breast: Secondary | ICD-10-CM

## 2016-01-08 DIAGNOSIS — Z5112 Encounter for antineoplastic immunotherapy: Secondary | ICD-10-CM

## 2016-01-08 LAB — CBC WITH DIFFERENTIAL/PLATELET
BASO%: 1.3 % (ref 0.0–2.0)
Basophils Absolute: 0 10*3/uL (ref 0.0–0.1)
EOS%: 3.8 % (ref 0.0–7.0)
Eosinophils Absolute: 0.1 10*3/uL (ref 0.0–0.5)
HEMATOCRIT: 37.1 % (ref 34.8–46.6)
HGB: 12.1 g/dL (ref 11.6–15.9)
LYMPH#: 0.6 10*3/uL — AB (ref 0.9–3.3)
LYMPH%: 16.1 % (ref 14.0–49.7)
MCH: 31 pg (ref 25.1–34.0)
MCHC: 32.6 g/dL (ref 31.5–36.0)
MCV: 95 fL (ref 79.5–101.0)
MONO#: 0.3 10*3/uL (ref 0.1–0.9)
MONO%: 9.2 % (ref 0.0–14.0)
NEUT%: 69.6 % (ref 38.4–76.8)
NEUTROS ABS: 2.6 10*3/uL (ref 1.5–6.5)
PLATELETS: 187 10*3/uL (ref 145–400)
RBC: 3.91 10*6/uL (ref 3.70–5.45)
RDW: 13.8 % (ref 11.2–14.5)
WBC: 3.8 10*3/uL — AB (ref 3.9–10.3)

## 2016-01-08 LAB — COMPREHENSIVE METABOLIC PANEL
ALBUMIN: 3.8 g/dL (ref 3.5–5.0)
ALK PHOS: 59 U/L (ref 40–150)
ALT: 26 U/L (ref 0–55)
AST: 27 U/L (ref 5–34)
Anion Gap: 7 mEq/L (ref 3–11)
BILIRUBIN TOTAL: 0.47 mg/dL (ref 0.20–1.20)
BUN: 19.8 mg/dL (ref 7.0–26.0)
CALCIUM: 8.8 mg/dL (ref 8.4–10.4)
CO2: 29 mEq/L (ref 22–29)
Chloride: 107 mEq/L (ref 98–109)
Creatinine: 0.8 mg/dL (ref 0.6–1.1)
EGFR: 75 mL/min/{1.73_m2} — AB (ref >90)
Glucose: 81 mg/dl (ref 70–140)
POTASSIUM: 4.1 meq/L (ref 3.5–5.1)
Sodium: 144 mEq/L (ref 136–145)
TOTAL PROTEIN: 6.4 g/dL (ref 6.4–8.3)

## 2016-01-08 MED ORDER — HEPARIN SOD (PORK) LOCK FLUSH 100 UNIT/ML IV SOLN
500.0000 [IU] | Freq: Once | INTRAVENOUS | Status: AC | PRN
  Administered 2016-01-08: 500 [IU]
  Filled 2016-01-08: qty 5

## 2016-01-08 MED ORDER — DIPHENHYDRAMINE HCL 25 MG PO CAPS
ORAL_CAPSULE | ORAL | Status: AC
  Filled 2016-01-08: qty 1

## 2016-01-08 MED ORDER — ACETAMINOPHEN 325 MG PO TABS
ORAL_TABLET | ORAL | Status: AC
  Filled 2016-01-08: qty 2

## 2016-01-08 MED ORDER — SODIUM CHLORIDE 0.9 % IV SOLN
Freq: Once | INTRAVENOUS | Status: AC
  Administered 2016-01-08: 11:00:00 via INTRAVENOUS

## 2016-01-08 MED ORDER — ACETAMINOPHEN 325 MG PO TABS
650.0000 mg | ORAL_TABLET | Freq: Once | ORAL | Status: AC
  Administered 2016-01-08: 650 mg via ORAL

## 2016-01-08 MED ORDER — SODIUM CHLORIDE 0.9 % IV SOLN
6.0000 mg/kg | Freq: Once | INTRAVENOUS | Status: AC
  Administered 2016-01-08: 399 mg via INTRAVENOUS
  Filled 2016-01-08: qty 19

## 2016-01-08 MED ORDER — DIPHENHYDRAMINE HCL 25 MG PO CAPS
25.0000 mg | ORAL_CAPSULE | Freq: Once | ORAL | Status: AC
  Administered 2016-01-08: 25 mg via ORAL

## 2016-01-08 MED ORDER — SODIUM CHLORIDE 0.9 % IJ SOLN
10.0000 mL | INTRAMUSCULAR | Status: DC | PRN
  Administered 2016-01-08: 10 mL
  Filled 2016-01-08: qty 10

## 2016-01-08 NOTE — Progress Notes (Signed)
  Oncology Nurse Navigator Documentation    Navigator Encounter Type: Treatment (01/08/16 1100)           Patient Visit Type: MedOnc (01/08/16 1100) Treatment Phase: Treatment (01/08/16 1100)     Interventions: Referrals (01/08/16 1100) Referrals: Survivorship (01/08/16 1100)                    Time Spent with Patient: 15 (01/08/16 1100)

## 2016-01-13 ENCOUNTER — Other Ambulatory Visit: Payer: Self-pay | Admitting: General Surgery

## 2016-01-16 ENCOUNTER — Telehealth: Payer: Self-pay | Admitting: *Deleted

## 2016-01-16 NOTE — Telephone Encounter (Signed)
"  I finished my Hereceptin last week.  Do I need labs drawn 01-22-2016." Advised that these are labs one week before F/U on 01-29-2016.  No further questions.

## 2016-01-19 ENCOUNTER — Other Ambulatory Visit: Payer: Self-pay | Admitting: *Deleted

## 2016-01-19 DIAGNOSIS — C50412 Malignant neoplasm of upper-outer quadrant of left female breast: Secondary | ICD-10-CM

## 2016-01-22 ENCOUNTER — Other Ambulatory Visit (HOSPITAL_BASED_OUTPATIENT_CLINIC_OR_DEPARTMENT_OTHER): Payer: BLUE CROSS/BLUE SHIELD

## 2016-01-22 DIAGNOSIS — C50412 Malignant neoplasm of upper-outer quadrant of left female breast: Secondary | ICD-10-CM

## 2016-01-22 LAB — COMPREHENSIVE METABOLIC PANEL
ALBUMIN: 4.1 g/dL (ref 3.5–5.0)
ALK PHOS: 67 U/L (ref 40–150)
ALT: 18 U/L (ref 0–55)
ANION GAP: 8 meq/L (ref 3–11)
AST: 21 U/L (ref 5–34)
BUN: 18.4 mg/dL (ref 7.0–26.0)
CALCIUM: 9.2 mg/dL (ref 8.4–10.4)
CHLORIDE: 105 meq/L (ref 98–109)
CO2: 29 mEq/L (ref 22–29)
Creatinine: 0.8 mg/dL (ref 0.6–1.1)
EGFR: 78 mL/min/{1.73_m2} — AB (ref >90)
Glucose: 91 mg/dl (ref 70–140)
POTASSIUM: 4.1 meq/L (ref 3.5–5.1)
Sodium: 142 mEq/L (ref 136–145)
Total Bilirubin: 0.4 mg/dL (ref 0.20–1.20)
Total Protein: 6.8 g/dL (ref 6.4–8.3)

## 2016-01-22 LAB — CBC WITH DIFFERENTIAL/PLATELET
BASO%: 1.3 % (ref 0.0–2.0)
BASOS ABS: 0.1 10*3/uL (ref 0.0–0.1)
EOS ABS: 0.1 10*3/uL (ref 0.0–0.5)
EOS%: 2.8 % (ref 0.0–7.0)
HEMATOCRIT: 37.2 % (ref 34.8–46.6)
HGB: 12.4 g/dL (ref 11.6–15.9)
LYMPH#: 0.7 10*3/uL — AB (ref 0.9–3.3)
LYMPH%: 16.5 % (ref 14.0–49.7)
MCH: 31.7 pg (ref 25.1–34.0)
MCHC: 33.4 g/dL (ref 31.5–36.0)
MCV: 94.8 fL (ref 79.5–101.0)
MONO#: 0.4 10*3/uL (ref 0.1–0.9)
MONO%: 8.8 % (ref 0.0–14.0)
NEUT#: 3.1 10*3/uL (ref 1.5–6.5)
NEUT%: 70.6 % (ref 38.4–76.8)
Platelets: 182 10*3/uL (ref 145–400)
RBC: 3.92 10*6/uL (ref 3.70–5.45)
RDW: 13.4 % (ref 11.2–14.5)
WBC: 4.4 10*3/uL (ref 3.9–10.3)

## 2016-01-29 ENCOUNTER — Telehealth: Payer: Self-pay | Admitting: Oncology

## 2016-01-29 ENCOUNTER — Encounter: Payer: Self-pay | Admitting: Genetic Counselor

## 2016-01-29 ENCOUNTER — Ambulatory Visit (HOSPITAL_BASED_OUTPATIENT_CLINIC_OR_DEPARTMENT_OTHER): Payer: BLUE CROSS/BLUE SHIELD | Admitting: Oncology

## 2016-01-29 VITALS — BP 105/67 | HR 70 | Temp 98.3°F | Resp 18 | Ht 68.0 in | Wt 147.1 lb

## 2016-01-29 DIAGNOSIS — IMO0002 Reserved for concepts with insufficient information to code with codable children: Secondary | ICD-10-CM | POA: Insufficient documentation

## 2016-01-29 DIAGNOSIS — Z853 Personal history of malignant neoplasm of breast: Secondary | ICD-10-CM | POA: Diagnosis not present

## 2016-01-29 DIAGNOSIS — C50412 Malignant neoplasm of upper-outer quadrant of left female breast: Secondary | ICD-10-CM

## 2016-01-29 NOTE — Telephone Encounter (Signed)
appt made and avs printed °

## 2016-01-29 NOTE — Progress Notes (Signed)
[place]  [phone] Fax:(336) [phone]     ID: Rhonda Gardner DOB: March 17, 1956  MR#: [id number]  FMB#:[id number]  Patient Care Team: Rita Ohara, MD as PCP - General (Family Medicine) Chauncey Cruel, MD as Consulting Physician (Oncology) Thea Silversmith, MD as Consulting Physician (Radiation Oncology) Mauro Kaufmann, RN as Registered Nurse Rockwell Germany, RN as Registered Nurse Holley Bouche, NP as Nurse Practitioner (Nurse Practitioner) Autumn Messing III, MD as Consulting Physician (General Surgery) PCP: Vikki Ports, MD OTHER MD: Rutherford Guys, MD  CHIEF COMPLAINT: HER-2 positive breast cancer  CURRENT TREATMENT:  observation   BREAST CANCER HISTORY: From the original intake note:  "Rhonda Gardner" had not had a physical exam in quite some time, and had not had a mammogram since 2020, when she was evaluated by Dr. Tomi Bamberger 12/01/2014. Dr. Tomi Bamberger was able to palpate a 2 cm firm mass at the 3:00 position in the left breast. The patient herself has been aware of multiple breast masses and does have a history of fibrocystic change, with multiple prior benign biopsies. She did not feel this particular mass had changed recently.   Dr. Tomi Bamberger arranged for bilateral diagnostic mammography and left breast ultrasonography at the [facility] Center 12/15/2014. This shows the breast density to be category C. In the left breast upper outer quadrant there was a bilobed mass measuring approximately 2 cm. This was palpable. Ultrasound confirmed an irregular microlobulated and hypoechoic mass measuring 2.1 cm at the 2:00 location 6 cm from the nipple. There was no left axillary adenopathy.  Biopsy of the mass in question 12/19/2014 showed ([id number]) invasive ductal carcinoma, grade 2 or 3, estrogen and progesterone receptor negative, with an MIB-1 of 38%, and equivocal HER-2 amplification, the signals ratio being 1.57, the number per cell 4.25.  On 12/27/2014 the patient  underwent bilateral breast MRI. The mass in question at the 2:00 location of the left breast measured 2.5 cm. It directly abuts the left pectoralis major. There is no enhancement in the muscle itself. There were no abnormal appearing lymph nodes. There was an incidentally noted 1 cm hepatic cyst at the dome of the right liver lobe.  Her subsequent history is as detailed below  INTERVAL HISTORY: Rhonda Gardner returns todayfor follow-up of her HER-2/neu positive breast cancer accompanied by her husband [name]. She completed her trastuzumab treatments 01/08/2016.   At the last visit here she expressed concern regarding a left breast mass and we went ahead and set her up for bilateral diagnostic mammography with tomosynthesis at [facility] 12/19/2015. This found the breast density to be category C. Mammography was unremarkable, but since there was a palpable area of concern a left ultrasound was obtained the same day. This found the previously notedhematoma at the 1-3:00 position posteriorly and it was drained. A total of 6 mL of bloody fluid was aspirated. In addition there was a cyst which was entirely evacuated.  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 There was no evidence of malignancy.  REVIEW OF SYSTEMS: Rhonda Gardner went on a family cruise to the [place] which she greatly enjoyed. She is exercising on a fitness center twice a week and doing videos and walking on other days. She still has blurred vision at times and she has not seen an ophthalmologist for at least 2 years. She has a little bit of sinus congestion. Otherwise a detailed review of systems today was entirely benign  PAST MEDICAL HISTORY: Past Medical History  Diagnosis Date  . Allergy 2006    chemical cauterization in spring 2006, and 2nd treatment with good results.(Dr.Shoemaker)  . Bipolar disorder ([place])   . Breast cancer ([place]) 12/2014    invasive ductal carcinoma (LEFT)  . Breast cancer of  upper-outer quadrant of left female breast ([place]) 12/22/2014  . PONV (postoperative nausea and vomiting)   . Breast hematoma 12/19/2015    had left breast aspiration of 4cm hematoma-path indicates benign hematoma    PAST SURGICAL HISTORY: Past Surgical History  Procedure Laterality Date  . Breast biopsy Bilateral     benign and a right stereotatic breast biopsy.  . Foot surgery Left 2014    Dr. Paulla Dolly  . Fibroid excision  age 33    prolapsed fibroid removed  . Radioactive seed guided mastectomy with axillary sentinel lymph node biopsy Left 05/25/2015    Procedure: LEFT BREAST LUMPECTOMY WITH RADIOACTIVE SEED AND SENTINEL LYMPH NODE BIOPSY;  Surgeon: Autumn Messing III, MD;  Location: [facility];  Service: General;  Laterality: Left;    FAMILY HISTORY Family History  Problem Relation Age of Onset  . Cancer Father     skin - non melanoma - farmer  . Heart disease Father   . Hypertension Mother   . Arthritis Mother     osteoarthritis  . Colon cancer Mother   . Macular degeneration Mother     wet and dry  . Cancer Mother 33    colon cancer at age 32  . Osteoporosis Sister     osteopenia  . Cancer Maternal Aunt     lung cancer  . Diabetes Paternal Aunt   . Cancer Paternal Aunt 76    ? stomach cancer  . Autism Sister   . Mental retardation Sister   . Blindness Sister     secondary to retrolental fibroplasia  . Goiter Paternal Aunt    the patient's father lived to be 95. He had a history of nonmelanoma skin cancers. [name] mother is living at age 76. She was diagnosed with colon cancer in her 57s. A paternal aunt may have had stomach cancer. Maternal aunt developed lung cancer in her 62s. The patient has one brother, 2 sisters. One of her sisters is blind and mentally retarded. There is no history of breast or ovarian cancer in the family.  GYNECOLOGIC HISTORY:  No LMP recorded. Patient is postmenopausal. Menarche age 78. The patient is GX P0. She stopped having  periods in her early 1s. She did not take hormone replacement. She took oral contraceptives remotely for over 5 years with no complications.  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 Her husband [name] worked in IT originally but now runs a family firm. It's just the 2 of them at home, plus a chocolate lab.    ADVANCED DIRECTIVES: In place   HEALTH MAINTENANCE: Social History  Substance Use Topics  . Smoking status: Never Smoker   . Smokeless tobacco: Never Used  . Alcohol Use: No     Colonoscopy: In [place] under Dr. Lavina Hamman, date not entirely clear  PAP: February 2016  Bone density: Under [facility]on, results not currently available  Lipid panel:  Allergies  Allergen Reactions  . Adhesive [Tape] Rash    Pt is sensitive to any kind of adhesive tape products.    Current Outpatient Prescriptions  Medication Sig Dispense Refill  . Cholecalciferol (VITAMIN D) 2000 UNITS tablet Take 2,000 Units by mouth daily.    Marland Kitchen  Cyanocobalamin (VITAMIN B-12) 5000 MCG SUBL Place 5,000 mcg under the tongue daily.    Marland Kitchen LAMICTAL 200 MG tablet Take 0.5 tablets (100 mg total) by mouth daily. 90 tablet 0  . lidocaine-prilocaine (EMLA) cream Apply to affected area once 30 g 3  . LUTEIN-ZEAXANTHIN PO Take 1 tablet by mouth daily.    . Misc Natural Products (TART CHERRY ADVANCED) CAPS Take 1 capsule by mouth daily.    . Multiple Vitamins-Minerals (PRESERVISION AREDS) CAPS Take 1 capsule by mouth 2 (two) times daily.    . naproxen sodium (ANAPROX) 220 MG tablet Take 220-440 mg by mouth daily as needed (pain). Aleve     No current facility-administered medications for this visit.    OBJECTIVE: Middle-aged white woman in no acute distress Filed Vitals:   01/29/16 1214  BP: 105/67  Pulse: 70  Temp: 98.3 F (36.8 C)  Resp: 18     Body mass index is 22.37 kg/(m^2).    ECOG FS:0 - Asymptomatic   Sclerae unicteric, pupils round and reactive, EOMs intact Oropharynx clear and  moist-- no thrush or other lesions No cervical or supraclavicular adenopathy Lungs no rales or rhonchi Heart regular rate and rhythm Abd soft, nontender, positive bowel sounds MSK no focal spinal tenderness, no upper extremity lymphedema Neuro: nonfocal, well oriented, appropriate affect Breasts: the right breast is unremarkable. The left breast is status post lumpectomy and radiation. There is no longer a palpable mass.t There are no skin or nipple changes of concern. The left axilla is benign. Skin: she brought to my attention a 2 mm subcutaneous firmness  Just above the elbow with no associated poor or bite mark and no erythema or tenderness.  LAB RESULTS:  CMP     Component Value Date/Time   NA 142 01/22/2016 1135   NA 142 01/13/2015 0822   K 4.1 01/22/2016 1135   K 3.8 01/13/2015 0822   CL 106 01/13/2015 0822   CO2 29 01/22/2016 1135   CO2 26 01/13/2015 0822   GLUCOSE 91 01/22/2016 1135   GLUCOSE 83 01/13/2015 0822   BUN 18.4 01/22/2016 1135   BUN 8 01/13/2015 0822   CREATININE 0.8 01/22/2016 1135   CREATININE 0.77 01/13/2015 0822   CREATININE 0.80 12/01/2014 1437   CALCIUM 9.2 01/22/2016 1135   CALCIUM 9.1 01/13/2015 0822   PROT 6.8 01/22/2016 1135   PROT 7.0 12/01/2014 1437   ALBUMIN 4.1 01/22/2016 1135   ALBUMIN 4.8 12/01/2014 1437   AST 21 01/22/2016 1135   AST 28 12/01/2014 1437   ALT 18 01/22/2016 1135   ALT 24 12/01/2014 1437   ALKPHOS 67 01/22/2016 1135   ALKPHOS 65 12/01/2014 1437   BILITOT 0.40 01/22/2016 1135   BILITOT 0.6 12/01/2014 1437   GFRNONAA 89* 01/13/2015 0822   GFRAA >90 01/13/2015 0822    INo results found for: SPEP, UPEP  Lab Results  Component Value Date   WBC 4.4 01/22/2016   NEUTROABS 3.1 01/22/2016   HGB 12.4 01/22/2016   HCT 37.2 01/22/2016   MCV 94.8 01/22/2016   PLT 182 01/22/2016      Chemistry      Component Value Date/Time   NA 142 01/22/2016 1135   NA 142 01/13/2015 0822   K 4.1 01/22/2016 1135   K 3.8 01/13/2015  0822   CL 106 01/13/2015 0822   CO2 29 01/22/2016 1135   CO2 26 01/13/2015 0822   BUN 18.4 01/22/2016 1135   BUN 8 01/13/2015 5248  CREATININE 0.8 01/22/2016 1135   CREATININE 0.77 01/13/2015 0822   CREATININE 0.80 12/01/2014 1437      Component Value Date/Time   CALCIUM 9.2 01/22/2016 1135   CALCIUM 9.1 01/13/2015 0822   ALKPHOS 67 01/22/2016 1135   ALKPHOS 65 12/01/2014 1437   AST 21 01/22/2016 1135   AST 28 12/01/2014 1437   ALT 18 01/22/2016 1135   ALT 24 12/01/2014 1437   BILITOT 0.40 01/22/2016 1135   BILITOT 0.6 12/01/2014 1437       No results found for: LABCA2  No components found for: LABCA125  No results for input(s): INR in the last 168 hours.  Urinalysis    Component Value Date/Time   LABSPEC 1.005 04/03/2015 1451   LABSPEC 1.010 08/09/2007 2121   PHURINE 7.5 04/03/2015 1451   PHURINE 7.5 08/09/2007 2121   GLUCOSEU Negative 04/03/2015 1451   GLUCOSEU NEGATIVE 08/09/2007 2121   HGBUR Trace 04/03/2015 1451   HGBUR SMALL* 08/09/2007 2121   BILIRUBINUR Negative 04/03/2015 1451   BILIRUBINUR neg 12/01/2014 [place] 08/09/2007 2121   KETONESUR Negative 04/03/2015 [place] 08/09/2007 2121   PROTEINUR Negative 04/03/2015 1451   PROTEINUR neg 12/01/2014 [place] 08/09/2007 2121   UROBILINOGEN 0.2 04/03/2015 1451   UROBILINOGEN negative 12/01/2014 1358   UROBILINOGEN 0.2 08/09/2007 2121   NITRITE Negative 04/03/2015 1451   NITRITE neg 12/01/2014 1358   NITRITE NEGATIVE 08/09/2007 2121   LEUKOCYTESUR Small 04/03/2015 1451   LEUKOCYTESUR Negative 12/01/2014 1358    STUDIES: No results found.  ASSESSMENT: 60 y.o. 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 This is very favorable. I think we are ready to start long-term follow-up.  She will need yearly diagnostic mammography and since they have a very high deductible I suggested [name] discussed that with [facility] as a probably have some sort of program for people in that situation.  She is really scheduled for port removal 02/15/2016  I reassured her that because she attained a complete pathologic response her prognosis is very good.   I am not sure what the very small subcutaneous lesion she brought to my attention in the right elbow area is, but we will follow-up on that at the next visit by exam.  I'm going to  see her again in October and then April of next year, after the 2018 mammography. From that point I will start seeing her on a once a year basis.Marland Kitchen  Chauncey Cruel, MD   01/29/2016 12:42 PM

## 2016-01-30 ENCOUNTER — Encounter: Payer: Self-pay | Admitting: Family Medicine

## 2016-02-09 ENCOUNTER — Encounter (HOSPITAL_BASED_OUTPATIENT_CLINIC_OR_DEPARTMENT_OTHER): Payer: Self-pay | Admitting: *Deleted

## 2016-02-15 ENCOUNTER — Ambulatory Visit (HOSPITAL_BASED_OUTPATIENT_CLINIC_OR_DEPARTMENT_OTHER): Payer: BLUE CROSS/BLUE SHIELD | Admitting: Anesthesiology

## 2016-02-15 ENCOUNTER — Ambulatory Visit (HOSPITAL_BASED_OUTPATIENT_CLINIC_OR_DEPARTMENT_OTHER)
Admission: RE | Admit: 2016-02-15 | Discharge: 2016-02-15 | Disposition: A | Discharge status: Home / Self Care | Payer: BLUE CROSS/BLUE SHIELD | Source: Ambulatory Visit | Attending: General Surgery | Admitting: General Surgery

## 2016-02-15 ENCOUNTER — Encounter (HOSPITAL_BASED_OUTPATIENT_CLINIC_OR_DEPARTMENT_OTHER): Payer: Self-pay

## 2016-02-15 ENCOUNTER — Encounter (HOSPITAL_BASED_OUTPATIENT_CLINIC_OR_DEPARTMENT_OTHER)
Admission: RE | Disposition: A | Discharge status: Home / Self Care | Payer: Self-pay | Source: Ambulatory Visit | Attending: General Surgery

## 2016-02-15 DIAGNOSIS — C50912 Malignant neoplasm of unspecified site of left female breast: Secondary | ICD-10-CM | POA: Diagnosis present

## 2016-02-15 DIAGNOSIS — Z452 Encounter for adjustment and management of vascular access device: Secondary | ICD-10-CM | POA: Diagnosis not present

## 2016-02-15 DIAGNOSIS — C50412 Malignant neoplasm of upper-outer quadrant of left female breast: Secondary | ICD-10-CM | POA: Insufficient documentation

## 2016-02-15 DIAGNOSIS — Z923 Personal history of irradiation: Secondary | ICD-10-CM | POA: Insufficient documentation

## 2016-02-15 HISTORY — PX: PORT-A-CATH REMOVAL: SHX5289

## 2016-02-15 SURGERY — REMOVAL PORT-A-CATH
Anesthesia: General | Site: Chest

## 2016-02-15 MED ORDER — SCOPOLAMINE 1 MG/3DAYS TD PT72
1.0000 | MEDICATED_PATCH | Freq: Once | TRANSDERMAL | Status: DC | PRN
  Administered 2016-02-15: 1.5 mg via TRANSDERMAL

## 2016-02-15 MED ORDER — OXYCODONE HCL 5 MG PO TABS: 5.0000 mg | ORAL_TABLET | Freq: Once | ORAL | Status: DC | PRN

## 2016-02-15 MED ORDER — DEXAMETHASONE SODIUM PHOSPHATE 10 MG/ML IJ SOLN
INTRAMUSCULAR | Status: AC
  Filled 2016-02-15: qty 1

## 2016-02-15 MED ORDER — HYDROCODONE-ACETAMINOPHEN 5-325 MG PO TABS: 1.0000 | ORAL_TABLET | Freq: Four times a day (QID) | ORAL | Status: DC | PRN

## 2016-02-15 MED ORDER — CHLORHEXIDINE GLUCONATE 4 % EX LIQD: 1.0000 "application " | Freq: Once | CUTANEOUS | Status: DC

## 2016-02-15 MED ORDER — HYDROMORPHONE HCL 1 MG/ML IJ SOLN: 0.2500 mg | INTRAMUSCULAR | Status: DC | PRN

## 2016-02-15 MED ORDER — FENTANYL CITRATE (PF) 100 MCG/2ML IJ SOLN
50.0000 ug | INTRAMUSCULAR | Status: DC | PRN
  Administered 2016-02-15: 100 ug via INTRAVENOUS

## 2016-02-15 MED ORDER — OXYCODONE HCL 5 MG/5ML PO SOLN: 5.0000 mg | Freq: Once | ORAL | Status: DC | PRN

## 2016-02-15 MED ORDER — PROPOFOL 10 MG/ML IV BOLUS
INTRAVENOUS | Status: AC
  Filled 2016-02-15: qty 20

## 2016-02-15 MED ORDER — SCOPOLAMINE 1 MG/3DAYS TD PT72
MEDICATED_PATCH | TRANSDERMAL | Status: AC
  Filled 2016-02-15: qty 1

## 2016-02-15 MED ORDER — MIDAZOLAM HCL 2 MG/2ML IJ SOLN
1.0000 mg | INTRAMUSCULAR | Status: DC | PRN
  Administered 2016-02-15: 2 mg via INTRAVENOUS

## 2016-02-15 MED ORDER — LIDOCAINE 2% (20 MG/ML) 5 ML SYRINGE
INTRAMUSCULAR | Status: AC
Start: 2016-02-15 — End: 2016-02-15
  Filled 2016-02-15: qty 5

## 2016-02-15 MED ORDER — MEPERIDINE HCL 25 MG/ML IJ SOLN: 6.2500 mg | INTRAMUSCULAR | Status: DC | PRN

## 2016-02-15 MED ORDER — LACTATED RINGERS IV SOLN
INTRAVENOUS | Status: DC
  Administered 2016-02-15: 13:00:00 via INTRAVENOUS

## 2016-02-15 MED ORDER — LIDOCAINE HCL (PF) 1 % IJ SOLN
INTRAMUSCULAR | Status: DC | PRN
  Administered 2016-02-15: 5 mL via INTRADERMAL

## 2016-02-15 MED ORDER — FENTANYL CITRATE (PF) 100 MCG/2ML IJ SOLN
INTRAMUSCULAR | Status: AC
  Filled 2016-02-15: qty 2

## 2016-02-15 MED ORDER — MIDAZOLAM HCL 2 MG/2ML IJ SOLN
INTRAMUSCULAR | Status: AC
  Filled 2016-02-15: qty 2

## 2016-02-15 MED ORDER — GLYCOPYRROLATE 0.2 MG/ML IJ SOLN: 0.2000 mg | Freq: Once | INTRAMUSCULAR | Status: DC | PRN

## 2016-02-15 MED ORDER — LIDOCAINE HCL (PF) 1 % IJ SOLN
INTRAMUSCULAR | Status: AC
  Filled 2016-02-15: qty 30

## 2016-02-15 MED ORDER — PROPOFOL 10 MG/ML IV BOLUS
INTRAVENOUS | Status: DC | PRN
  Administered 2016-02-15 (×2): 20 mg via INTRAVENOUS
  Administered 2016-02-15: 40 mg via INTRAVENOUS

## 2016-02-15 MED ORDER — ONDANSETRON HCL 4 MG/2ML IJ SOLN
INTRAMUSCULAR | Status: AC
  Filled 2016-02-15: qty 2

## 2016-02-15 MED ORDER — BUPIVACAINE-EPINEPHRINE 0.25% -1:200000 IJ SOLN
INTRAMUSCULAR | Status: DC | PRN
  Administered 2016-02-15: 5 mL

## 2016-02-15 SURGICAL SUPPLY — 25 items
BLADE SURG 15 STRL LF DISP TIS (BLADE) ×1 IMPLANT
BLADE SURG 15 STRL SS (BLADE) ×2
CHLORAPREP W/TINT 26ML (MISCELLANEOUS) ×2 IMPLANT
COVER BACK TABLE 60X90IN (DRAPES) ×2 IMPLANT
COVER MAYO STAND STRL (DRAPES) ×2 IMPLANT
DECANTER SPIKE VIAL GLASS SM (MISCELLANEOUS) ×2 IMPLANT
DRAPE LAPAROTOMY 100X72 PEDS (DRAPES) ×2 IMPLANT
DRAPE UTILITY XL STRL (DRAPES) ×2 IMPLANT
ELECT COATED BLADE 2.86 ST (ELECTRODE) IMPLANT
ELECT REM PT RETURN 9FT ADLT (ELECTROSURGICAL)
ELECTRODE REM PT RTRN 9FT ADLT (ELECTROSURGICAL) IMPLANT
GLOVE BIO SURGEON STRL SZ7.5 (GLOVE) ×2 IMPLANT
GOWN STRL REUS W/ TWL LRG LVL3 (GOWN DISPOSABLE) ×2 IMPLANT
GOWN STRL REUS W/TWL LRG LVL3 (GOWN DISPOSABLE) ×4
LIQUID BAND (GAUZE/BANDAGES/DRESSINGS) ×2 IMPLANT
NEEDLE HYPO 25X1 1.5 SAFETY (NEEDLE) ×2 IMPLANT
PACK BASIN DAY SURGERY FS (CUSTOM PROCEDURE TRAY) ×2 IMPLANT
PENCIL BUTTON HOLSTER BLD 10FT (ELECTRODE) IMPLANT
SLEEVE SCD COMPRESS KNEE MED (MISCELLANEOUS) IMPLANT
SUT MON AB 4-0 PC3 18 (SUTURE) ×2 IMPLANT
SUT VIC AB 3-0 SH 27 (SUTURE) ×2
SUT VIC AB 3-0 SH 27X BRD (SUTURE) ×1 IMPLANT
SYR CONTROL 10ML LL (SYRINGE) ×2 IMPLANT
TOWEL OR 17X24 6PK STRL BLUE (TOWEL DISPOSABLE) ×2 IMPLANT
TOWEL OR NON WOVEN STRL DISP B (DISPOSABLE) ×2 IMPLANT

## 2016-02-15 NOTE — Anesthesia Preprocedure Evaluation (Signed)
Anesthesia Evaluation  Patient identified by MRN, date of birth, ID band Patient awake    Reviewed: Allergy & Precautions, NPO status , Patient's Chart, lab work & pertinent test results  History of Anesthesia Complications (+) PONV  Airway Mallampati: I  TM Distance: >3 FB Neck ROM: Full    Dental  (+) Teeth Intact, Dental Advisory Given   Pulmonary  breath sounds clear to auscultation        Cardiovascular Rhythm:Regular Rate:Normal     Neuro/Psych    GI/Hepatic   Endo/Other    Renal/GU      Musculoskeletal   Abdominal   Peds  Hematology   Anesthesia Other Findings   Reproductive/Obstetrics                             Anesthesia Physical Anesthesia Plan  ASA: II  Anesthesia Plan: General   Post-op Pain Management:    Induction: Intravenous  Airway Management Planned: LMA  Additional Equipment:   Intra-op Plan:   Post-operative Plan: Extubation in OR  Informed Consent: I have reviewed the patients History and Physical, chart, labs and discussed the procedure including the risks, benefits and alternatives for the proposed anesthesia with the patient or authorized representative who has indicated his/her understanding and acceptance.   Dental advisory given  Plan Discussed with: CRNA, Anesthesiologist and Surgeon  Anesthesia Plan Comments:         Anesthesia Quick Evaluation  

## 2016-02-15 NOTE — Anesthesia Postprocedure Evaluation (Signed)
Anesthesia Post Note  Patient: Rhonda Gardner  Procedure(s) Performed: Procedure(s) (LRB): REMOVAL PORT-A-CATH (N/A)  Patient location during evaluation: PACU Anesthesia Type: General Level of consciousness: awake and alert Pain management: pain level controlled Vital Signs Assessment: post-procedure vital signs reviewed and stable Respiratory status: spontaneous breathing, nonlabored ventilation and respiratory function stable Cardiovascular status: blood pressure returned to baseline and stable Postop Assessment: no signs of nausea or vomiting Anesthetic complications: no    Last Vitals:  Filed Vitals:   02/15/16 1549 02/15/16 1600  BP: 123/87 110/55  Pulse: 75 67  Temp: 36.4 C   Resp: 18 17    Last Pain:  Filed Vitals:   02/15/16 1602  PainSc: 0-No pain                 Otis Burress A

## 2016-02-15 NOTE — Op Note (Signed)
02/15/2016  3:44 PM  PATIENT:  Rhonda Gardner  60 y.o. female  PRE-OPERATIVE DIAGNOSIS:  Left breast cancer   POST-OPERATIVE DIAGNOSIS:  Left breast cancer   PROCEDURE:  Procedure(s): REMOVAL PORT-A-CATH (N/A)  SURGEON:  Surgeon(s) and Role:    * Jovita Kussmaul, MD - Primary  PHYSICIAN ASSISTANT:   ASSISTANTS: none   ANESTHESIA:   local and IV sedation  EBL:  Total I/O In: 1000 [I.V.:1000] Out: -   BLOOD ADMINISTERED:none  DRAINS: none   LOCAL MEDICATIONS USED:  MARCAINE     SPECIMEN:  No Specimen  DISPOSITION OF SPECIMEN:  N/A  COUNTS:  YES  TOURNIQUET:  * No tourniquets in log *  DICTATION: .Dragon Dictation   After informed consent was obtained the patient was brought to the operating room and placed in the supine position on the operating table. After adequate IV sedation and being given the patient's right chest was prepped with ChloraPrep, allowed to dry, and draped in usual sterile manner. An appropriate timeout was performed. The area around the port was then infiltrated with 1% lidocaine as well as quarter percent Marcaine until a good field block was created. A small incision was made with a 15 blade knife through her old incision. Incision was carried through the subcutaneous tissue sharply with the 15 blade knife until the capsule of the port was opened. The 2 anchoring stitches were divided and removed. The port was then gently pushed out of its pocket and with gentle traction was removed from the patient. Pressure was held on the area for several minutes until the area was completely hemostatic. The deep layer of the wound was then closed with interrupted 3-0 Vicryl stitches. The skin was then closed with interrupted 4-0 Monocryl subcuticular stitches. Dermabond dressings were applied. The patient tolerated the procedure well. At the end of the case all needle sponge and instrument counts were correct. The patient was then awakened and taken to recovery  in stable condition.  PLAN OF CARE: Discharge to home after PACU  PATIENT DISPOSITION:  PACU - hemodynamically stable.   Delay start of Pharmacological VTE agent (>24hrs) due to surgical blood loss or risk of bleeding: not applicable

## 2016-02-15 NOTE — H&P (Signed)
Rhonda Gardner Rhonda Gardner  Location: [facility] Patient #: [id number] DOB: 05/23/1956 Married / Language: English / Race: White Female   History of Present Illness  Patient words: ltf breast check.  The patient is a 60 year old female who presents for a follow-up for Breast cancer. The patient is a 60 year old white female who is about 3 months status post left breast lumpectomy and sentinel node mapping for a clinical T2 N0 left breast cancer. She received neoadjuvant therapy and had a complete pathologic response. She finished radiation therapy in October. She was ER/PR negative and HER-2 positive with a Ki-67 of 38%. She is still receiving Herceptin therapy and tolerating it well.   Problem List/Past Medical  BREAST CANCER, LEFT (C50.912) PRIMARY CANCER OF UPPER OUTER QUADRANT OF LEFT FEMALE BREAST (C50.412)  Other Problems  Back Pain Breast Cancer Depression General anesthesia - complications Lump In Breast  Past Surgical History  Foot Surgery Left. Breast Biopsy Bilateral. multiple Oral Surgery  Diagnostic Studies  Pap Smear 1-5 years ago Colonoscopy 1-5 years ago Mammogram within last year  Allergies  No Known Drug Allergies03/24/2016 Adhesive Tape Rash.  Medication History LaMICtal (100MG Tablet, Oral) Active. Vitamin D (2000UNIT Capsule, Oral) Active. Questran (4GM Packet, Oral) Active. Vitamin B12 (3000MCG/ML Liquid, Sublingual) Active. Lidocaine (Anorectal) (5% Cream, External) Active. Loperamide A-D (2MG Tablet, Oral) Active. LORazepam (0.5MG Tablet, Oral) Active. Omeprazole (40MG Capsule DR, Oral) Active. Compazine (10MG Tablet, Oral) Active. Tobramycin (0.3% Solution, Ophthalmic) Active. ValACYclovir HCl (500MG Tablet, Oral two times daily) Active. Medications Reconciled  Social History  Alcohol use Remotely quit alcohol use. Caffeine use Carbonated beverages, Coffee. No drug use Tobacco use Never  smoker.  Family History  Alcohol Abuse Family Members In General. Breast Cancer Family Members In General. Cancer Father. Cerebrovascular Accident Family Members In General. Diabetes Mellitus Family Members In General. Heart Disease Family Members In General. Thyroid problems Family Members In General. Arthritis Family Members In General, Mother. Colon Cancer Mother. Hypertension Family Members In General, Mother. Respiratory Condition Family Members In General, Mother.  Pregnancy / Birth History  Irregular periods Age at menarche 21 years, 14 years. Age of menopause 51-55 Contraceptive History Contraceptive implant, Intrauterine device, Oral contraceptives. Gravida 1 Maternal age 60-25 Para 0    Review of Systems  General Not Present- Appetite Loss, Chills, Fatigue, Fever, Night Sweats, Weight Gain and Weight Loss. Skin Not Present- Change in Wart/Mole, Dryness, Hives, Jaundice, New Lesions, Non-Healing Wounds, Rash and Ulcer. HEENT Not Present- Earache, Hearing Loss, Hoarseness, Nose Bleed, Oral Ulcers, Ringing in the Ears, Seasonal Allergies, Sinus Pain, Sore Throat, Visual Disturbances, Wears glasses/contact lenses and Yellow Eyes. Breast Present- Breast Mass. Not Present- Breast Pain, Nipple Discharge and Skin Changes. Cardiovascular Not Present- Chest Pain, Difficulty Breathing Lying Down, Leg Cramps, Palpitations, Rapid Heart Rate, Shortness of Breath and Swelling of Extremities. Gastrointestinal Not Present- Abdominal Pain, Bloating, Bloody Stool, Change in Bowel Habits, Chronic diarrhea, Constipation, Difficulty Swallowing, Excessive gas, Gets full quickly at meals, Hemorrhoids, Indigestion, Nausea, Rectal Pain and Vomiting. Female Genitourinary Not Present- Frequency, Nocturia, Painful Urination, Pelvic Pain and Urgency. Musculoskeletal Not Present- Back Pain, Joint Pain, Joint Stiffness, Muscle Pain, Muscle Weakness and Swelling of  Extremities. Neurological Not Present- Decreased Memory, Fainting, Headaches, Numbness, Seizures, Tingling, Tremor, Trouble walking and Weakness. Psychiatric Present- Bipolar. Not Present- Anxiety, Change in Sleep Pattern, Depression, Fearful and Frequent crying. Endocrine Not Present- Cold Intolerance, Excessive Hunger, Hair Changes, Heat Intolerance, Hot flashes and New Diabetes. Hematology Not Present- Easy Bruising,  Excessive bleeding, Gland problems, HIV and Persistent Infections.  Vitals  Weight: 147.31 lb Height: 816in Body Surface Area: 10.88 m Body Mass Index: 0.16 kg/m  BP: 122/80 (Sitting, Left Arm, Standard)       Physical Exam  General Mental Status-Alert. General Appearance-Consistent with stated age. Hydration-Well hydrated. Voice-Normal.  Head and Neck Head-normocephalic, atraumatic with no lesions or palpable masses. Trachea-midline. Thyroid Gland Characteristics - normal size and consistency.  Eye Eyeball - Bilateral-Extraocular movements intact. Sclera/Conjunctiva - Bilateral-No scleral icterus.  Chest and Lung Exam Chest and lung exam reveals -quiet, even and easy respiratory effort with no use of accessory muscles and on auscultation, normal breath sounds, no adventitious sounds and normal vocal resonance. Inspection Chest Wall - Normal. Back - normal.  Breast Note: The left breast and axillary incisions have healed nicely with no sign of infection. There is minimal firmness along the left breast incision. Otherwise there is no palpable mass in either breast. There is no palpable axillary, supraclavicular, or cervical lymphadenopathy. There is a port on the right chest wall   Cardiovascular Cardiovascular examination reveals -normal heart sounds, regular rate and rhythm with no murmurs and normal pedal pulses bilaterally.  Abdomen Inspection Inspection of the abdomen reveals - No Hernias. Skin - Scar - no surgical  scars. Palpation/Percussion Palpation and Percussion of the abdomen reveal - Soft, Non Tender, No Rebound tenderness, No Rigidity (guarding) and No hepatosplenomegaly. Auscultation Auscultation of the abdomen reveals - Bowel sounds normal.  Neurologic Neurologic evaluation reveals -alert and oriented x 3 with no impairment of recent or remote memory. Mental Status-Normal.  Musculoskeletal Normal Exam - Left-Upper Extremity Strength Normal and Lower Extremity Strength Normal. Normal Exam - Right-Upper Extremity Strength Normal and Lower Extremity Strength Normal.  Lymphatic Head & Neck  General Head & Neck Lymphatics: Bilateral - Description - Normal. Axillary  General Axillary Region: Bilateral - Description - Normal. Tenderness - Non Tender. Femoral & Inguinal  Generalized Femoral & Inguinal Lymphatics: Bilateral - Description - Normal. Tenderness - Non Tender.    Assessment & Plan  PRIMARY CANCER OF UPPER OUTER QUADRANT OF LEFT FEMALE BREAST (C50.412) Impression: The patient is about 3 months status post left breast lumpectomy for breast cancer. She continues to do well. She will continue with Herceptin therapy. She finished radiation therapy in October. She will continue to do regular self exams. I will plan to see her back in about 6 months. She would like to have her port removed when the Herceptin is done. I have discussed with her in detail the risks and benefits of the operation to remove the port as well as some of the technical aspects and she understands and wishes to proceed. Current Plans Follow up with [place] in the office in 6 MONTHS.  Call us sooner as needed.    Signed by Luella Cook, MD

## 2016-02-15 NOTE — Interval H&P Note (Signed)
History and Physical Interval Note:  02/15/2016 3:03 PM  Rhonda Gardner  has presented today for surgery, with the diagnosis of Left breast cancer   The various methods of treatment have been discussed with the patient and family. After consideration of risks, benefits and other options for treatment, the patient has consented to  Procedure(s): REMOVAL PORT-A-CATH (N/A) as a surgical intervention .  [name] history has been reviewed, patient examined, no change in status, stable for surgery.  I have reviewed the patient's chart and labs.  Questions were answered to the patient's satisfaction.     TOTH III,PAUL S

## 2016-02-15 NOTE — Transfer of Care (Signed)
Immediate Anesthesia Transfer of Care Note  Patient: Rhonda Gardner  Procedure(s) Performed: Procedure(s): REMOVAL PORT-A-CATH (N/A)  Patient Location: PACU  Anesthesia Type:MAC  Level of Consciousness: awake and alert   Airway & Oxygen Therapy: Patient Spontanous Breathing and Patient connected to face mask oxygen  Post-op Assessment: Report given to RN and Post -op Vital signs reviewed and stable  Post vital signs: Reviewed and stable  Last Vitals:  Filed Vitals:   02/15/16 1300 02/15/16 1549  BP: 115/53 123/87  Pulse: 77 75  Temp: 36.7 C   Resp: 18     Last Pain: There were no vitals filed for this visit.       Complications: No apparent anesthesia complications

## 2016-02-15 NOTE — Discharge Instructions (Signed)

## 2016-02-18 ENCOUNTER — Encounter (HOSPITAL_BASED_OUTPATIENT_CLINIC_OR_DEPARTMENT_OTHER): Payer: Self-pay | Admitting: General Surgery

## 2016-02-23 ENCOUNTER — Other Ambulatory Visit: Payer: Self-pay | Admitting: *Deleted

## 2016-02-26 ENCOUNTER — Other Ambulatory Visit: Payer: Self-pay | Admitting: *Deleted

## 2016-02-26 DIAGNOSIS — N6320 Unspecified lump in the left breast, unspecified quadrant: Secondary | ICD-10-CM

## 2016-02-26 DIAGNOSIS — C50412 Malignant neoplasm of upper-outer quadrant of left female breast: Secondary | ICD-10-CM

## 2016-03-04 ENCOUNTER — Encounter: Payer: Self-pay | Admitting: Family Medicine

## 2016-03-07 ENCOUNTER — Encounter: Payer: Self-pay | Admitting: Family Medicine

## 2016-03-07 ENCOUNTER — Ambulatory Visit (INDEPENDENT_AMBULATORY_CARE_PROVIDER_SITE_OTHER): Payer: BLUE CROSS/BLUE SHIELD | Admitting: Family Medicine

## 2016-03-07 VITALS — BP 120/72 | HR 68 | Temp 98.1°F | Ht 68.0 in | Wt 142.4 lb

## 2016-03-07 DIAGNOSIS — R21 Rash and other nonspecific skin eruption: Secondary | ICD-10-CM | POA: Diagnosis not present

## 2016-03-07 MED ORDER — METHYLPREDNISOLONE ACETATE 80 MG/ML IJ SUSP
80.0000 mg | Freq: Once | INTRAMUSCULAR | Status: AC
  Administered 2016-03-07: 80 mg via INTRAMUSCULAR

## 2016-03-07 NOTE — Progress Notes (Signed)
Chief Complaint  Patient presents with  . Rash    that started 2 days ago and has worsened. Very itchy, not painful.    Woke up with rash 2 days ago.  It is itchy and has been spreading.  It is on her lower face/jaw, and spreading to neck. Denies any new products, lotions, soaps. Denies any yardwork, but has a dog who is outside, walks in the woods. Has had some tick bites--one on the left knee, and one on the right hip a while ago.  Not recent.  Never had rash, fever, or other symptoms, just itching at the tick sight. No new foods, no medications/supplements.  She used 2.5% cortisone cream--helps temporarily with the itching, but it continues to spread. Using it twice daily for the last 2 days. Hasn't taken any anti-histamines.  PMH, PSH, SH reviewed.  Outpatient Encounter Prescriptions as of 03/07/2016  Medication Sig  . Cholecalciferol (VITAMIN D) 2000 UNITS tablet Take 2,000 Units by mouth daily.  Marland Kitchen LAMICTAL 200 MG tablet Take 0.5 tablets (100 mg total) by mouth daily.  . LUTEIN-ZEAXANTHIN PO Take 1 tablet by mouth daily.  . magnesium 30 MG tablet Take 30 mg by mouth daily.  . Misc Natural Products (TART CHERRY ADVANCED) CAPS Take 1 capsule by mouth daily.  . Multiple Vitamins-Minerals (PRESERVISION AREDS PO) Take 2 capsules by mouth daily.  . vitamin C (ASCORBIC ACID) 500 MG tablet Take 500 mg by mouth daily.  . naproxen sodium (ANAPROX) 220 MG tablet Take 220-440 mg by mouth daily as needed (pain). Reported on 03/07/2016  . [DISCONTINUED] HYDROcodone-acetaminophen (NORCO) 5-325 MG tablet Take 1-2 tablets by mouth every 6 (six) hours as needed.  . [EXPIRED] methylPREDNISolone acetate (DEPO-MEDROL) injection 80 mg    No facility-administered encounter medications on file as of 03/07/2016.   Allergies  Allergen Reactions  . Adhesive [Tape] Rash    Pt is sensitive to any kind of adhesive tape products.   ROS: no fever, chills, URI symptoms, headaches, dizziness, chest pain,  palpitations, shortness of breath, edema. +rash as per HPI. No other skin concerns. No nausea, vomiting, or other complaints.  PHYSICAL EXAM: BP 120/72 mmHg  Pulse 68  Temp(Src) 98.1 F (36.7 C) (Tympanic)  Ht 5\' 8"  (1.727 m)  Wt 142 lb 6.4 oz (64.592 kg)  BMI 21.66 kg/m2  Well developed, pleasant female, in good spirits, accompanied by her sister with disabilities (having come straight from court to try and get guardianship).  Patches of inflamed skin, erythematous on the right chin--seen as separate large patches, where on the left cheek the rash has coalesced involving much of the lower jaw and spreading onto the left neck.  No vesicles, no pustules  ASSESSMENT/PLAN:  Rash and nonspecific skin eruption - Plan: methylPREDNISolone acetate (DEPO-MEDROL) injection 80 mg  Discussed risks/side effects of steroids in detail. Depo Medrol Antihistamines and HC cream prn   Contact dermatitis--? Etiology    You may continue to use the hydrocortisone if needed for itching. I recommend using zyrtec or benadryl to also help with the reaction and itching.  Return or call if symptoms recur. If ongoing spread, or any other changes, we might also need to re-evaluate. If this is a simple contact dermatitis, the steroids and above treatments should take care of it. If the rash recurs within 5-10 days, we might need to add a little oral prednisone at the end, but likely the one injection today should take care of it.

## 2016-03-07 NOTE — Patient Instructions (Signed)
You may continue to use the hydrocortisone if needed for itching. I recommend using zyrtec or benadryl to also help with the reaction and itching.  Return or call if symptoms recur. If ongoing spread, or any other changes, we might also need to re-evaluate. If this is a simple contact dermatitis, the steroids and above treatments should take care of it. If the rash recurs within 5-10 days, we might need to add a little oral prednisone at the end, but likely the one injection today should take care of it.   Contact Dermatitis Dermatitis is redness, soreness, and swelling (inflammation) of the skin. Contact dermatitis is a reaction to certain substances that touch the skin. There are two types of contact dermatitis:   Irritant contact dermatitis. This type is caused by something that irritates your skin, such as dry hands from washing them too much. This type does not require previous exposure to the substance for a reaction to occur. This type is more common.  Allergic contact dermatitis. This type is caused by a substance that you are allergic to, such as a nickel allergy or poison ivy. This type only occurs if you have been exposed to the substance (allergen) before. Upon a repeat exposure, your body reacts to the substance. This type is less common. CAUSES  Many different substances can cause contact dermatitis. Irritant contact dermatitis is most commonly caused by exposure to:   Makeup.   Soaps.   Detergents.   Bleaches.   Acids.   Metal salts, such as nickel.  Allergic contact dermatitis is most commonly caused by exposure to:   Poisonous plants.   Chemicals.   Jewelry.   Latex.   Medicines.   Preservatives in products, such as clothing.  RISK FACTORS This condition is more likely to develop in:   People who have jobs that expose them to irritants or allergens.  People who have certain medical conditions, such as asthma or eczema.  SYMPTOMS    Symptoms of this condition may occur anywhere on your body where the irritant has touched you or is touched by you. Symptoms include:  Dryness or flaking.   Redness.   Cracks.   Itching.   Pain or a burning feeling.   Blisters.  Drainage of small amounts of blood or clear fluid from skin cracks. With allergic contact dermatitis, there may also be swelling in areas such as the eyelids, mouth, or genitals.  DIAGNOSIS  This condition is diagnosed with a medical history and physical exam. A patch skin test may be performed to help determine the cause. If the condition is related to your job, you may need to see an occupational medicine specialist. TREATMENT Treatment for this condition includes figuring out what caused the reaction and protecting your skin from further contact. Treatment may also include:   Steroid creams or ointments. Oral steroid medicines may be needed in more severe cases.  Antibiotics or antibacterial ointments, if a skin infection is present.  Antihistamine lotion or an antihistamine taken by mouth to ease itching.  A bandage (dressing). HOME CARE INSTRUCTIONS Skin Care  Moisturize your skin as needed.   Apply cool compresses to the affected areas.  Try taking a bath with:  Epsom salts. Follow the instructions on the packaging. You can get these at your local pharmacy or grocery store.  Baking soda. Pour a small amount into the bath as directed by your health care provider.  Colloidal oatmeal. Follow the instructions on the packaging. You can get  this at your local pharmacy or grocery store.  Try applying baking soda paste to your skin. Stir water into baking soda until it reaches a paste-like consistency.  Do not scratch your skin.  Bathe less frequently, such as every other day.  Bathe in lukewarm water. Avoid using hot water. Medicines  Take or apply over-the-counter and prescription medicines only as told by your health care  provider.   If you were prescribed an antibiotic medicine, take or apply your antibiotic as told by your health care provider. Do not stop using the antibiotic even if your condition starts to improve. General Instructions  Keep all follow-up visits as told by your health care provider. This is important.  Avoid the substance that caused your reaction. If you do not know what caused it, keep a journal to try to track what caused it. Write down:  What you eat.  What cosmetic products you use.  What you drink.  What you wear in the affected area. This includes jewelry.  If you were given a dressing, take care of it as told by your health care provider. This includes when to change and remove it. SEEK MEDICAL CARE IF:   Your condition does not improve with treatment.  Your condition gets worse.  You have signs of infection such as swelling, tenderness, redness, soreness, or warmth in the affected area.  You have a fever.  You have new symptoms. SEEK IMMEDIATE MEDICAL CARE IF:   You have a severe headache, neck pain, or neck stiffness.  You vomit.  You feel very sleepy.  You notice red streaks coming from the affected area.  Your bone or joint underneath the affected area becomes painful after the skin has healed.  The affected area turns darker.  You have difficulty breathing.   This information is not intended to replace advice given to you by your health care provider. Make sure you discuss any questions you have with your health care provider.   Document Released: 09/27/2000 Document Revised: 06/21/2015 Document Reviewed: 02/15/2015 Elsevier Interactive Patient Education [employer].

## 2016-04-02 ENCOUNTER — Ambulatory Visit (HOSPITAL_BASED_OUTPATIENT_CLINIC_OR_DEPARTMENT_OTHER): Payer: BLUE CROSS/BLUE SHIELD | Admitting: Nurse Practitioner

## 2016-04-02 ENCOUNTER — Encounter: Payer: Self-pay | Admitting: Nurse Practitioner

## 2016-04-02 VITALS — BP 107/87 | HR 67 | Temp 98.6°F | Resp 18 | Ht 68.0 in | Wt 139.9 lb

## 2016-04-02 DIAGNOSIS — C50412 Malignant neoplasm of upper-outer quadrant of left female breast: Secondary | ICD-10-CM

## 2016-04-02 DIAGNOSIS — Z853 Personal history of malignant neoplasm of breast: Secondary | ICD-10-CM | POA: Diagnosis not present

## 2016-04-02 NOTE — Progress Notes (Signed)
CLINIC:  Cancer Survivorship   REASON FOR VISIT:  Routine follow-up post-treatment for a recent history of breast cancer.  BRIEF ONCOLOGIC HISTORY:    Breast cancer of upper-outer quadrant of left female breast ([place])   12/15/2014 Mammogram A bilobed mass is identified in the left breast upper outer quadrant corresponding to the questioned palpable finding measuring approximately 2 cm.   12/15/2014 Breast US Suspicious left breast mass 2 o'clock location   12/19/2014 Initial Biopsy Left breast core needle bx: invasive ductal carcinoma, grade 2 or 3, ER- (0%), PR- (0%), HER2 equivocal but positive on further testing (ratio 3.4), Ki67 38%   12/19/2014 Clinical Stage Stage IIA: T2 N0   12/27/2014 Breast MRI 2.5 cm enhancing left breast 2 o'clock location mass compatible with biopsy-proven malignancy. This directly abuts the left pectoralis major muscle and superficial muscle involvement is possible although there is no abnormal intramuscular enhancement   01/16/2015 - 04/21/2015 Neo-Adjuvant Chemotherapy Carboplatin, docetaxel (changed to gemcitabine for final 2 cycles due to CIPN), trastuzumab, and pertuzumab x 6 cycles   05/2015 - 01/08/2016 Chemotherapy Maintenance biotherapy with trastuzumab to complete one year of therapy   05/25/2015 Definitive Surgery Left lumpectomy/SLNB: no residual invasive disease   05/25/2015 Pathologic Stage ypT0 ypN0   06/28/2015 - 08/11/2015 Radiation Therapy Left breast/ 45 Gy at 1.8 Gy per fraction x 25 fractions.  Left breast boost/ 16 Gy at 2 Gy per fraction x 8 fractions    INTERVAL HISTORY:  Rhonda Gardner presents to the [facility] today for our initial meeting to review her survivorship care plan detailing her treatment course for breast cancer, as well as monitoring long-term side effects of that treatment, education regarding health maintenance, screening, and overall wellness and health promotion.     Overall, Rhonda Gardner reports feeling quite well  since completing her trastruzumab therapy approximately three months ago.  She denies headache, cough, shortness of breath or bone pain.  She has a good appetite and denies any weight loss.  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  She denies headache, dizziness, visual changes (aside from dry eyes) or nausea.  She has intermittent fatigue which does not interfere with her activities.  REVIEW OF SYSTEMS:  General: Denies fever, chills, unintentional weight loss, or night sweats. HEENT: Denies visual changes, hearing loss, mouth sores or difficulty swallowing. Cardiac: Denies palpitations, chest pain, and lower extremity edema.  Respiratory: Denies wheeze or dyspnea on exertion.  Breast: As above. GI: Denies abdominal pain, constipation, diarrhea, nausea, or vomiting.  GU: Denies dysuria, hematuria, vaginal bleeding, vaginal discharge, or vaginal dryness.  Musculoskeletal: As above. Neuro: As above. Skin: Denies rash, pruritis, or open wounds.  Psych: Denies depression, anxiety, insomnia, or memory loss.   A 14-point review of systems was completed and was negative, except as noted above.   ONCOLOGY TREATMENT TEAM:  1. Surgeon:  Dr. Marlou Starks at [facility]  2. Medical Oncologist: Dr. Jana Hakim 3. Radiation Oncologist: Dr. Pablo Ledger    PAST MEDICAL/SURGICAL HISTORY:  Past Medical History  Diagnosis Date  . Allergy 2006    chemical cauterization in spring 2006, and 2nd treatment with good results.(Dr.Shoemaker)  . Bipolar disorder ([place])   . Breast cancer (Rhonda Gardner) 12/2014    invasive ductal carcinoma (LEFT)  . Breast cancer of upper-outer quadrant of left female breast ([place]) 12/22/2014  . PONV (postoperative nausea and vomiting)   . Breast hematoma 12/19/2015  had left breast aspiration of 4cm hematoma-path indicates benign hematoma   Past Surgical History  Procedure  Laterality Date  . Breast biopsy Bilateral     benign and a right stereotatic breast biopsy.  . Foot surgery Left 2014    Dr. Paulla Dolly  . Fibroid excision  age 74    prolapsed fibroid removed  . Radioactive seed guided mastectomy with axillary sentinel lymph node biopsy Left 05/25/2015    Procedure: LEFT BREAST LUMPECTOMY WITH RADIOACTIVE SEED AND SENTINEL LYMPH NODE BIOPSY;  Surgeon: Autumn Messing III, MD;  Location: [place];  Service: General;  Laterality: Left;  . Port-a-cath removal N/A 02/15/2016    Procedure: REMOVAL PORT-A-CATH;  Surgeon: Autumn Messing III, MD;  Location: [place];  Service: General;  Laterality: N/A;     ALLERGIES:  Allergies  Allergen Reactions  . Adhesive [Tape] Rash    Pt is sensitive to any kind of adhesive tape products.     CURRENT MEDICATIONS:  Current Outpatient Prescriptions on File Prior to Visit  Medication Sig Dispense Refill  . Cholecalciferol (VITAMIN D) 2000 UNITS tablet Take 2,000 Units by mouth daily.    Marland Kitchen LAMICTAL 200 MG tablet Take 0.5 tablets (100 mg total) by mouth daily. 90 tablet 0  . LUTEIN-ZEAXANTHIN PO Take 1 tablet by mouth daily.    . magnesium 30 MG tablet Take 30 mg by mouth daily.    . Misc Natural Products (TART CHERRY ADVANCED) CAPS Take 1 capsule by mouth daily.    . Multiple Vitamins-Minerals (PRESERVISION AREDS PO) Take 2 capsules by mouth daily.    . naproxen sodium (ANAPROX) 220 MG tablet Take 220-440 mg by mouth daily as needed (pain). Reported on 03/07/2016    . vitamin C (ASCORBIC ACID) 500 MG tablet Take 500 mg by mouth daily.     No current facility-administered medications on file prior to visit.     ONCOLOGIC FAMILY HISTORY:  Family History  Problem Relation Age of Onset  . Cancer Father     skin - non melanoma - farmer  . Heart disease Father   . Hypertension Mother   . Arthritis Mother     osteoarthritis  . Colon cancer Mother   . Macular degeneration Mother     wet and dry  .  Cancer Mother 68    colon cancer at age 45  . Osteoporosis Sister     osteopenia  . Cancer Maternal Aunt     lung cancer  . Diabetes Paternal Aunt   . Cancer Paternal Aunt 37    ? stomach cancer  . Autism Sister   . Mental retardation Sister   . Blindness Sister     secondary to retrolental fibroplasia  . Goiter Paternal Aunt      GENETIC COUNSELING/TESTING: No    SOCIAL HISTORY:  Rhonda Gardner is married and lives with her spouse in [place], [place].  She has no children. 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  She denies any current or history of tobacco, alcohol, or illicit drug use.     PHYSICAL EXAMINATION:  Vital Signs: Filed Vitals:   04/02/16 1022  BP: 107/87  Pulse: 67  Temp: 98.6 F (37 C)  Resp: 18   ECOG Performance Status: 0  General: Well-nourished, well-appearing female in no acute distress.  She is unaccompanied/accompanied in clinic by her husband today.   HEENT: Head is atraumatic and normocephalic. Hair has regrown.  Pupils  equal and reactive to light and accomodation. Conjunctivae clear without exudate.  Sclerae anicteric. Oral mucosa is pink, moist, and intact without lesions.  Oropharynx is pink without lesions or erythema.  Lymph: No cervical, supraclavicular, infraclavicular, or axillary lymphadenopathy noted on palpation.  Cardiovascular: Regular rate and rhythm without murmurs, rubs, or gallops. Respiratory: Clear to auscultation bilaterally. Chest expansion symmetric without accessory muscle use on inspiration or expiration.  GI: Abdomen soft and round. No tenderness to palpation. Bowel sounds normoactive in 4 quadrants. GU: Deferred.   Neuro: No focal deficits. Steady gait.  Psych: Mood and affect normal and appropriate for situation.  Extremities: No edema, cyanosis, or clubbing.  Skin: Warm and dry. No open lesions noted.   LABORATORY DATA:  None for this visit.  DIAGNOSTIC IMAGING:  None  for this visit.     ASSESSMENT AND PLAN:   1. 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  Rhonda Gardner is doing well without clinical symptoms worrisome for disease recurrence. She will follow-up with her medical oncologist, Dr. Jana Hakim, in October 2017 with history and physical examination per surveillance protocol.  Her last mammogram was completed in March 2017 and unremarkable, and her last echo in December 2016 was stable.  A comprehensive survivorship care plan and treatment summary was reviewed with the patient today detailing her breast cancer diagnosis, treatment course, potential late/long-term effects of treatment, appropriate follow-up care with recommendations for the future, and patient education resources.  A copy of this summary, along with a letter will be sent to the patient's primary care provider via in basket message after today's visit.  Rhonda Gardner is welcome to return to the Survivorship Clinic in the future, as needed; no follow-up will be scheduled at this time.    2. Cancer screening:  Due to Rhonda Gardner's history and her age, she should receive screening for skin cancers, colon cancer, and gynecologic cancers.  The information and recommendations are listed on the patient's comprehensive care plan/treatment summary and were reviewed in detail with the patient.    3. Health maintenance and wellness promotion: Rhonda Gardner was encouraged to consume 5-7 servings of fruits and vegetables per day.  We reviewed the "Nutrition Rainbow" handout, as well as discussed recommendations to maximize nutrition and minimize recurrence, such as increased intake of fruits, vegetables, lean proteins, and minimizing the intake of red meats and processed foods.  She was also encouraged to engage in moderate to vigorous exercise for 30 minutes per day most days of the week. We discussed the [employer] fitness program, which is designed for cancer survivors to help them become more physically fit after cancer treatments.  She was instructed to limit her alcohol consumption and continue to abstain from tobacco use.  A copy of the "Take Control of Your Health" brochure was given to her reinforcing these recommendations.   4. Support services/counseling: It is not uncommon for this period of the patient's cancer care trajectory to be one of many emotions and stressors.  We discussed an opportunity for her to participate in the next session of [facility] ("Finding Your New Normal") support  group series designed for patients after they have completed treatment.  Rhonda Gardner was encouraged to take advantage of our many other support services programs, support groups, and/or counseling in coping with her new life as a cancer survivor after completing anti-cancer treatment.  She was offered support today through active listening and expressive supportive counseling.  She was given information regarding our available services and encouraged to contact me with any questions or for help enrolling in any of our support group/programs.    A total of 60 minutes of face-to-face time was spent with this patient with greater than 50% of that time in counseling and care-coordination.   Sylvan Cheese, NP  Survivorship [facility] [place] [phone]   Note: PRIMARY CARE PROVIDER Vikki Ports, [place] [phone]

## 2016-04-08 ENCOUNTER — Ambulatory Visit (INDEPENDENT_AMBULATORY_CARE_PROVIDER_SITE_OTHER): Payer: BLUE CROSS/BLUE SHIELD | Admitting: Family Medicine

## 2016-04-08 ENCOUNTER — Encounter: Payer: Self-pay | Admitting: Family Medicine

## 2016-04-08 VITALS — BP 114/74 | HR 60 | Ht 66.5 in | Wt 140.8 lb

## 2016-04-08 DIAGNOSIS — Z23 Encounter for immunization: Secondary | ICD-10-CM | POA: Diagnosis not present

## 2016-04-08 DIAGNOSIS — Z0000 Encounter for general adult medical examination without abnormal findings: Secondary | ICD-10-CM

## 2016-04-08 DIAGNOSIS — C50412 Malignant neoplasm of upper-outer quadrant of left female breast: Secondary | ICD-10-CM

## 2016-04-08 LAB — POCT URINALYSIS DIPSTICK
Bilirubin, UA: NEGATIVE
Blood, UA: NEGATIVE
GLUCOSE UA: NEGATIVE
Ketones, UA: NEGATIVE
LEUKOCYTES UA: NEGATIVE
NITRITE UA: NEGATIVE
PROTEIN UA: NEGATIVE
SPEC GRAV UA: 1.01
UROBILINOGEN UA: NEGATIVE
pH, UA: 6

## 2016-04-08 NOTE — Progress Notes (Signed)
Chief Complaint  Patient presents with  . Annual Exam    fasting (light breakfast @ 7:30am) annual exam with pap. No concerns.     Rhonda Gardner is a 60 y.o. female who presents for a complete physical.  She has the following concerns:  She was seen last month with a rash, felt to be contact dermatitis, treated with Depo Medrol injection. It had been on her face, and she reports it spread to her chest shortly after getting the shot, but it was short-lived and completely resolved.  Left breast cancer (invasive ductal carcinoma) was diagnosed after her last physical (when she had palpable mass on her breast exam); She is s/p L breast lumpectomy with radioactive seed and sentinel node biopsy, neoadjuvant chemo (April-July 2016) and adjuvant radiation (September-October 2016) and Herceptin (for a total of a year). She had a hematoma (related to a fall) aspirated from the left breast in March of this year. She states it recurred, and Dr. Isaiah Blakes sent her back to Dr. Marlou Starks. They are just observing it for now.  She denies any change.  She reports it is mildly tender (not as sore as in the past). She was just recently seen by oncologist/survivorship program. Doing well, follow-up planned for October with her oncologist.  Bipolar disorder: Diagnosed age 87 or 49. She has been on lamictal for about 15-20 years, and doing very well. Moods are well controlled, no side effects. She had been seeing Letta Moynahan and Gala Murdoch, but McMullin retired.  Dr. Jana Hakim has been prescribing medication for her over the last year.She takes just 1/2 tablet daily.  Immunization History  Administered Date(s) Administered  . DTaP 10/14/1954, 10/15/1955, 10/15/1959  . Gamma Globulin 08/23/1988  . Hepatitis A 11/02/1996, 09/29/2002  . Hepatitis B 11/11/2007  . IPV 08/15/1988  . Influenza Split 11/11/2007, 07/13/2013  . Influenza-Unspecified 07/31/2015  . MMR 04/29/2003  . Measles 07/25/1988  .  Meningococcal Polysaccharide 07/08/1988  . Rubella 07/25/1988  . Td 10/15/1987, 11/02/1996, 01/23/1998  . Tdap 11/11/2007, 01/20/2013  . Typhoid Live 11/11/2007  . Typhoid Parenteral 07/25/1988, 08/23/1988  . Zoster 12/01/2014   Last Pap smear: 11/2014--normal, no high risk HPV detected Last mammogram: 12/2015 Last colonoscopy:05/2011 (Dr. Lavina Hamman, [place])--normal, internal hemorrhoids Last DEXA: 03/2009 normal Dentist: once yearly Ophtho:3-4 years ago Exercise: class (at [facility])--50/50 cardio/weights or videos (cardio/total body video) at least 3x/week, walks with a neighbor 3-4 times/week but reports it isn't as fast as she would like (walks at her neighbor's pace). She started walking with 2# weights. Vitamin D-OH 61 on 12/01/14 Lipids:. Lab Results  Component Value Date   CHOL 199 12/01/2014   HDL 119 12/01/2014   LDLCALC 65 12/01/2014   TRIG 76 12/01/2014   CHOLHDL 1.7 12/01/2014   Past Medical History  Diagnosis Date  . Allergy 2006    chemical cauterization in spring 2006, and 2nd treatment with good results.(Dr.Shoemaker)  . Bipolar disorder ([place])   . Breast cancer ([place]) 12/2014    invasive ductal carcinoma (LEFT)  . Breast cancer of upper-outer quadrant of left female breast ([place]) 12/22/2014  . PONV (postoperative nausea and vomiting)   . Breast hematoma 12/19/2015    had left breast aspiration of 4cm hematoma-path indicates benign hematoma    Past Surgical History  Procedure Laterality Date  . Breast biopsy Bilateral     benign and a right stereotatic breast biopsy.  . Foot surgery Left 2014    Dr. Paulla Dolly  . Fibroid excision  age 41  prolapsed fibroid removed  . Radioactive seed guided mastectomy with axillary sentinel lymph node biopsy Left 05/25/2015    Procedure: LEFT BREAST LUMPECTOMY WITH RADIOACTIVE SEED AND SENTINEL LYMPH NODE BIOPSY;  Surgeon: Autumn Messing III, MD;  Location: [place];  Service: General;  Laterality: Left;  . Port-a-cath removal  N/A 02/15/2016    Procedure: REMOVAL PORT-A-CATH;  Surgeon: Autumn Messing III, MD;  Location: [place] [place];  Service: General;  Laterality: N/A;    Social History   Social History  . Marital Status: Married    Spouse Name: N/A  . Number of Children: N/A  . Years of Education: N/A   Occupational History  . Not on file.   Social History Main Topics  . Smoking status: Never Smoker   . Smokeless tobacco: Never Used  . Alcohol Use: 0.0 oz/week    0 Standard drinks or equivalent per week     Comment: 1-2 drinks/week, usually wine  . Drug Use: No  . Sexual Activity:    Partners: Male     Comment: postmenopausal   Other Topics Concern  . Not on file   Social History Narrative   Married, 1 dog (chocolate lab). Homemaker    Family History  Problem Relation Age of Onset  . Cancer Father     skin - non melanoma - farmer  . Heart disease Father   . Hypertension Mother   . Arthritis Mother     osteoarthritis  . Colon cancer Mother   . Macular degeneration Mother     wet and dry  . Cancer Mother 7    colon cancer at age 20  . Osteoporosis Sister     osteopenia (states it resolved)  . Cancer Maternal Aunt     lung cancer  . Cancer Paternal Aunt 78    ? stomach cancer  . Autism Sister   . Mental retardation Sister   . Blindness Sister     secondary to retrolental fibroplasia  . Goiter Paternal Aunt   . Diabetes Paternal Aunt     Outpatient Encounter Prescriptions as of 04/08/2016  Medication Sig  . cetirizine (ZYRTEC) 10 MG tablet Take 10 mg by mouth daily.  . Cholecalciferol (VITAMIN D) 2000 UNITS tablet Take 2,000 Units by mouth daily.  Marland Kitchen LAMICTAL 200 MG tablet Take 0.5 tablets (100 mg total) by mouth daily.  . LUTEIN-ZEAXANTHIN PO Take 1 tablet by mouth daily.  . magnesium 30 MG tablet Take 30 mg by mouth daily.  . Misc Natural Products (TART CHERRY ADVANCED) CAPS Take 1 capsule by mouth daily.  . Multiple Vitamins-Minerals (PRESERVISION AREDS PO) Take 2  capsules by mouth daily.  . vitamin C (ASCORBIC ACID) 500 MG tablet Take 500 mg by mouth daily.  . naproxen sodium (ANAPROX) 220 MG tablet Take 220-440 mg by mouth daily as needed (pain). Reported on 04/08/2016   No facility-administered encounter medications on file as of 04/08/2016.    Allergies  Allergen Reactions  . Adhesive [Tape] Rash    Pt is sensitive to any kind of adhesive tape products.    ROS: The patient denies anorexia, fever, weight changes, headaches, vision changes, decreased hearing, ear pain, sore throat, breast concerns, chest pain, palpitations, dizziness, syncope, dyspnea on exertion, cough, swelling, nausea, vomiting, diarrhea, constipation, abdominal pain, melena, hematochezia, indigestion/heartburn, hematuria, incontinence, dysuria, vaginal bleeding, discharge, odor or itch, genital lesions, joint pains, numbness, tingling, weakness, tremor, suspicious skin lesions, depression, anxiety, abnormal bleeding/bruising, or enlarged lymph nodes.  Neuropathy from chemo has resolved.  Slight off-balance at times, initially related to chemo, significantly improved.   PHYSICAL EXAM:  BP 114/74 mmHg  Pulse 60  Ht 5' 6.5" (1.689 m)  Wt 140 lb 12.8 oz (63.866 kg)  BMI 22.39 kg/m2   General Appearance:   Alert, cooperative, no distress, appears stated age  Head:   Normocephalic, without obvious abnormality, atraumatic  Eyes:   PERRL, conjunctiva/corneas clear, EOM's intact, fundi   benign  Ears:   Normal TM's and external ear canals.   Nose:  Nares normal, mucosa normal, no drainage or sinus tenderness  Throat:  Lips, mucosa, and tongue normal; teeth and gums normal  Neck:  Supple, no lymphadenopathy; thyroid: no enlargement/tenderness/nodules; no carotid  bruit or JVD  Back:  Spine nontender, no curvature, ROM normal, no CVA tenderness  Lungs:   Clear to auscultation bilaterally without wheezes, rales or ronchi; respirations  unlabored  Chest Wall:   No tenderness or deformityexcept as noted in breast exam  Heart:   Regular rate and rhythm, S1 and S2 normal, no murmur, rub  or gallop  Breast Exam:   No tenderness, nipple discharge or inversion. No axillary lymphadenopathy. WHSS at left lateral breast, with 3.5x3.5 firm area just below the scar.  Minimally tender.  No fluctuance, erythema, warmth.  Abdomen:   Soft, non-tender, nondistended, normoactive bowel sounds,   no masses, no hepatosplenomegaly  Genitalia:   Normal external genitalia without lesions, mild atrophic changes. BUS and vagina normal; no cervical motion tenderness. No abnormal vaginal discharge. Uterus and adnexa not enlarged, nontender, no masses. Pap not performed  Rectal:   Normal tone, no masses or tenderness; guaiac negative stool  Extremities:  No clubbing, cyanosis or edema  Pulses:  2+ and symmetric all extremities  Skin:  Skin color, texture, turgor normal, no rashes or lesions. Ecchymosis right medial elbow, right shin (known trauma)  Lymph nodes:  Cervical, supraclavicular, and axillary nodes normal  Neurologic:  CNII-XII intact, normal strength, sensation and gait; reflexes 2+ and symmetric throughout   Psych: Normal mood, affect, hygiene and grooming         Lab Results  Component Value Date   WBC 4.4 01/22/2016   HGB 12.4 01/22/2016   HCT 37.2 01/22/2016   MCV 94.8 01/22/2016   PLT 182 01/22/2016     Chemistry      Component Value Date/Time   NA 142 01/22/2016 1135   NA 142 01/13/2015 0822   K 4.1 01/22/2016 1135   K 3.8 01/13/2015 0822   CL 106 01/13/2015 0822   CO2 29 01/22/2016 1135   CO2 26 01/13/2015 0822   BUN 18.4 01/22/2016 1135   BUN 8 01/13/2015 0822   CREATININE 0.8 01/22/2016 1135   CREATININE 0.77 01/13/2015 0822   CREATININE 0.80 12/01/2014 1437      Component Value Date/Time   CALCIUM 9.2 01/22/2016 1135   CALCIUM 9.1 01/13/2015  0822   ALKPHOS 67 01/22/2016 1135   ALKPHOS 65 12/01/2014 1437   AST 21 01/22/2016 1135   AST 28 12/01/2014 1437   ALT 18 01/22/2016 1135   ALT 24 12/01/2014 1437   BILITOT 0.40 01/22/2016 1135   BILITOT 0.6 12/01/2014 1437      ASSESSMENT/PLAN:  Annual physical exam - Plan: POCT Urinalysis Dipstick, Visual acuity screening  Immunization due - pneumonia vaccination recommended given recent cancer dx.  Pneumovax at age 45 - Plan: Pneumococcal conjugate vaccine 13-valent  Breast cancer of upper-outer quadrant of left  female breast [facility]) - s/p surgery, chemo and radiation, in remission.  Hematoma related to trauma, stable    Discussed monthly self breast exams and yearly mammograms; at least 30 minutes of aerobic activity at least 5 days/week, weight-bearing exercise at least 2x/week; proper sunscreen use reviewed; healthy diet, including goals of calcium and vitamin D intake and alcohol recommendations (less than or equal to 1 drink/day) reviewed; regular seatbelt use; changing batteries in smoke detectors. Immunization recommendations discussed--continue yearly flu shots.  Give [id number] today given recent Breast Cancer.  Pneumovax at age 77. Colonoscopy recommendations reviewed--due again 05/2016.   Prevnar-13 today. DEXA recommended--patient to get at [facility] routine eye exam

## 2016-04-08 NOTE — Patient Instructions (Signed)
  HEALTH MAINTENANCE RECOMMENDATIONS:  It is recommended that you get at least 30 minutes of aerobic exercise at least 5 days/week (for weight loss, you may need as much as 60-90 minutes). This can be any activity that gets your heart rate up. This can be divided in 10-15 minute intervals if needed, but try and build up your endurance at least once a week.  Weight bearing exercise is also recommended twice weekly.  Eat a healthy diet with lots of vegetables, fruits and fiber.  "Colorful" foods have a lot of vitamins (ie green vegetables, tomatoes, red peppers, etc).  Limit sweet tea, regular sodas and alcoholic beverages, all of which has a lot of calories and sugar.  Up to 1 alcoholic drink daily may be beneficial for women (unless trying to lose weight, watch sugars).  Drink a lot of water.  Calcium recommendations are 1200-1500 mg daily (1500 mg for postmenopausal women or women without ovaries), and vitamin D 1000 IU daily.  This should be obtained from diet and/or supplements (vitamins), and calcium should not be taken all at once, but in divided doses.  Monthly self breast exams and yearly mammograms for women over the age of 34 is recommended.  Sunscreen of at least SPF 30 should be used on all sun-exposed parts of the skin when outside between the hours of 10 am and 4 pm (not just when at beach or pool, but even with exercise, golf, tennis, and yard work!)  Use a sunscreen that says "broad spectrum" so it covers both UVA and UVB rays, and make sure to reapply every 1-2 hours.  Remember to change the batteries in your smoke detectors when changing your clock times in the spring and fall.  Use your seat belt every time you are in a car, and please drive safely and not be distracted with cell phones and texting while driving.  You are now due for your colonoscopy (this summer)--if you don't hear from your gastroenterologist, please call his office to arrange.  Please schedule a routine eye  exam. I recommend getting a bone density test--schedule through [facility], and they can fax [place] a rquest for the order.

## 2016-05-08 ENCOUNTER — Other Ambulatory Visit: Payer: Self-pay | Admitting: Oncology

## 2016-05-08 NOTE — Telephone Encounter (Signed)
Chart reviewed.

## 2016-05-23 ENCOUNTER — Encounter: Payer: Self-pay | Admitting: Oncology

## 2016-05-29 ENCOUNTER — Encounter: Payer: Self-pay | Admitting: Oncology

## 2016-05-30 ENCOUNTER — Other Ambulatory Visit (INDEPENDENT_AMBULATORY_CARE_PROVIDER_SITE_OTHER): Payer: BLUE CROSS/BLUE SHIELD

## 2016-05-30 ENCOUNTER — Other Ambulatory Visit: Payer: Self-pay | Admitting: Family Medicine

## 2016-05-30 DIAGNOSIS — Z1159 Encounter for screening for other viral diseases: Secondary | ICD-10-CM

## 2016-05-30 DIAGNOSIS — Z23 Encounter for immunization: Secondary | ICD-10-CM | POA: Diagnosis not present

## 2016-05-31 LAB — HEPATITIS C ANTIBODY: HCV AB: NEGATIVE

## 2016-06-20 DIAGNOSIS — M810 Age-related osteoporosis without current pathological fracture: Secondary | ICD-10-CM

## 2016-06-20 HISTORY — DX: Age-related osteoporosis without current pathological fracture: M81.0

## 2016-06-20 LAB — HM DEXA SCAN

## 2016-06-20 LAB — HM MAMMOGRAPHY

## 2016-06-26 ENCOUNTER — Encounter: Payer: Self-pay | Admitting: *Deleted

## 2016-07-08 LAB — HM COLONOSCOPY

## 2016-07-11 ENCOUNTER — Encounter: Payer: Self-pay | Admitting: Family Medicine

## 2016-07-22 ENCOUNTER — Other Ambulatory Visit (HOSPITAL_BASED_OUTPATIENT_CLINIC_OR_DEPARTMENT_OTHER): Payer: BLUE CROSS/BLUE SHIELD

## 2016-07-22 DIAGNOSIS — C50412 Malignant neoplasm of upper-outer quadrant of left female breast: Secondary | ICD-10-CM | POA: Diagnosis not present

## 2016-07-22 LAB — COMPREHENSIVE METABOLIC PANEL
ALT: 15 U/L (ref 0–55)
ANION GAP: 7 meq/L (ref 3–11)
AST: 18 U/L (ref 5–34)
Albumin: 3.8 g/dL (ref 3.5–5.0)
Alkaline Phosphatase: 65 U/L (ref 40–150)
BILIRUBIN TOTAL: 0.33 mg/dL (ref 0.20–1.20)
BUN: 14.5 mg/dL (ref 7.0–26.0)
CHLORIDE: 110 meq/L — AB (ref 98–109)
CO2: 27 meq/L (ref 22–29)
Calcium: 9 mg/dL (ref 8.4–10.4)
Creatinine: 0.8 mg/dL (ref 0.6–1.1)
EGFR: 83 mL/min/{1.73_m2} — AB (ref >90)
Glucose: 82 mg/dl (ref 70–140)
POTASSIUM: 4.1 meq/L (ref 3.5–5.1)
SODIUM: 145 meq/L (ref 136–145)
Total Protein: 6.7 g/dL (ref 6.4–8.3)

## 2016-07-22 LAB — CBC WITH DIFFERENTIAL/PLATELET
BASO%: 0.6 % (ref 0.0–2.0)
Basophils Absolute: 0 10*3/uL (ref 0.0–0.1)
EOS ABS: 0.2 10*3/uL (ref 0.0–0.5)
EOS%: 5.3 % (ref 0.0–7.0)
HCT: 38.2 % (ref 34.8–46.6)
HGB: 12.7 g/dL (ref 11.6–15.9)
LYMPH%: 20.5 % (ref 14.0–49.7)
MCH: 31.7 pg (ref 25.1–34.0)
MCHC: 33.2 g/dL (ref 31.5–36.0)
MCV: 95.3 fL (ref 79.5–101.0)
MONO#: 0.4 10*3/uL (ref 0.1–0.9)
MONO%: 11.6 % (ref 0.0–14.0)
NEUT#: 2.2 10*3/uL (ref 1.5–6.5)
NEUT%: 62 % (ref 38.4–76.8)
PLATELETS: 177 10*3/uL (ref 145–400)
RBC: 4.01 10*6/uL (ref 3.70–5.45)
RDW: 13.1 % (ref 11.2–14.5)
WBC: 3.6 10*3/uL — ABNORMAL LOW (ref 3.9–10.3)
lymph#: 0.7 10*3/uL — ABNORMAL LOW (ref 0.9–3.3)

## 2016-07-28 NOTE — Progress Notes (Signed)
[facility]  [phone] Fax:(336) [phone]     ID: Martrice Apt Cox-Fishman DOB: [birth year]  MR#: [id number]  JYN#:[id number]  Patient Care Team: Rita Ohara, MD as PCP - General (Family Medicine) Chauncey Cruel, MD as Consulting Physician (Oncology) Thea Silversmith, MD as Consulting Physician (Radiation Oncology) Mauro Kaufmann, RN as Registered Nurse Rockwell Germany, RN as Registered Nurse Holley Bouche, NP as Nurse Practitioner (Nurse Practitioner) Autumn Messing III, MD as Consulting Physician (General Surgery) PCP: Vikki Ports, MD OTHER MD: Rutherford Guys, MD  CHIEF COMPLAINT: HER-2 positive breast cancer  CURRENT TREATMENT:  observation   BREAST CANCER HISTORY: From the original intake note:  "Rhonda Gardner" had not had a physical exam in quite some time, and had not had a mammogram since 2020, when she was evaluated by Dr. Tomi Bamberger 12/01/2014. Dr. Tomi Bamberger was able to palpate a 2 cm firm mass at the 3:00 position in the left breast. The patient herself has been aware of multiple breast masses and does have a history of fibrocystic change, with multiple prior benign biopsies. She did not feel this particular mass had changed recently.   Dr. Tomi Bamberger arranged for bilateral diagnostic mammography and left breast ultrasonography at the breast Center 12/15/2014. This shows the breast density to be category C. In the left breast upper outer quadrant there was a bilobed mass measuring approximately 2 cm. This was palpable. Ultrasound confirmed an irregular microlobulated and hypoechoic mass measuring 2.1 cm at the 2:00 location 6 cm from the nipple. There was no left axillary adenopathy.  Biopsy of the mass in question 12/19/2014 showed ([id number]) invasive ductal carcinoma, grade 2 or 3, estrogen and progesterone receptor negative, with an MIB-1 of 38%, and equivocal HER-2 amplification, the signals ratio being 1.57, the number per cell 4.25.  On 12/27/2014 the patient  underwent bilateral breast MRI. The mass in question at the 2:00 location of the left breast measured 2.5 cm. It directly abuts the left pectoralis major. There is no enhancement in the muscle itself. There were no abnormal appearing lymph nodes. There was an incidentally noted 1 cm hepatic cyst at the dome of the right liver lobe.  Her subsequent history is as detailed below  INTERVAL HISTORY: Rhonda Gardner returns today accompanied by her husband [name] for follow-up of her HER-2/neu positive breast cancer. She is currently under observation. Her main concern today is that she has recently been diagnosed with osteoporosis.   REVIEW OF SYSTEMS: Rhonda Gardner continues to exercise regularly. She still has blurred vision, and tells me evaluation by Dr. Gershon Crane was not enlightening. Her hair is becoming less currently and going back to his natural straightness. A detailed review of systems today was otherwise noncontributory.  PAST MEDICAL HISTORY: Past Medical History:  Diagnosis Date  . Allergy 2006   chemical cauterization in spring 2006, and 2nd treatment with good results.(Dr.Shoemaker)  . Bipolar disorder ([place])   . Breast cancer ([place]) 12/2014   invasive ductal carcinoma (LEFT)  . Breast cancer of upper-outer quadrant of left female breast ([place]) 12/22/2014  . Breast hematoma 12/19/2015   had left breast aspiration of 4cm hematoma-path indicates benign hematoma  . Osteoporosis 06/20/2016   T-2.5'@spine'  on Dexa ([place])  . PONV (postoperative nausea and vomiting)     PAST SURGICAL HISTORY: Past Surgical History:  Procedure Laterality Date  . BREAST BIOPSY Bilateral    benign and a right stereotatic breast biopsy.  . fibroid excision  age 24   prolapsed fibroid removed  .  FOOT SURGERY Left 2014   Dr. Paulla Dolly  . [facility] REMOVAL N/A 02/15/2016   Procedure: REMOVAL PORT-A-CATH;  Surgeon: Autumn Messing III, MD;  Location: [facility];  Service: General;  Laterality: N/A;  . RADIOACTIVE SEED  GUIDED MASTECTOMY WITH AXILLARY SENTINEL LYMPH NODE BIOPSY Left 05/25/2015   Procedure: LEFT BREAST LUMPECTOMY WITH RADIOACTIVE SEED AND SENTINEL LYMPH NODE BIOPSY;  Surgeon: Autumn Messing III, MD;  Location: [facility];  Service: General;  Laterality: Left;    FAMILY HISTORY Family History  Problem Relation Age of Onset  . Cancer Father     skin - non melanoma - farmer  . Heart disease Father   . Hypertension Mother   . Arthritis Mother     osteoarthritis  . Colon cancer Mother   . Macular degeneration Mother     wet and dry  . Cancer Mother 69    colon cancer at age 33  . Osteoporosis Sister     osteopenia (states it resolved)  . Cancer Maternal Aunt     lung cancer  . Cancer Paternal Aunt 74    ? stomach cancer  . Autism Sister   . Mental retardation Sister   . Blindness Sister     secondary to retrolental fibroplasia  . Goiter Paternal Aunt   . Diabetes Paternal Aunt    the patient's father lived to be 95. He had a history of nonmelanoma skin cancers. [name] mother is living at age 3. She was diagnosed with colon cancer in her 57s. A paternal aunt may have had stomach cancer. Maternal aunt developed lung cancer in her 41s. The patient has one brother, 2 sisters. One of her sisters is blind and mentally retarded. There is no history of breast or ovarian cancer in the family.  GYNECOLOGIC HISTORY:  No LMP recorded. Patient is postmenopausal. Menarche age 60. The patient is GX P0. She stopped having periods in her early 28s. She did not take hormone replacement. She took oral contraceptives remotely for over 5 years with no complications.  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 Her husband [name] worked in IT originally but now runs a family firm. It's just the 2 of them at home, plus a chocolate lab.    ADVANCED DIRECTIVES: In place   HEALTH MAINTENANCE: Social History  Substance Use Topics  . Smoking status: Never Smoker  . Smokeless  tobacco: Never Used  . Alcohol use 0.0 oz/week     Comment: 1-2 drinks/week, usually wine     Colonoscopy: In [place] under Dr. Lavina Hamman, date not entirely clear  PAP: February 2016  Bone density: Under [facility], results not currently available  Lipid panel:  Allergies  Allergen Reactions  . Adhesive [Tape] Rash    Pt is sensitive to any kind of adhesive tape products.    Current Outpatient Prescriptions  Medication Sig Dispense Refill  . cetirizine (ZYRTEC) 10 MG tablet Take 10 mg by mouth daily.    . Cholecalciferol (VITAMIN D) 2000 UNITS tablet Take 2,000 Units by mouth daily.    Marland Kitchen LAMICTAL 200 MG tablet TAKE 1/2 TABLET BY MOUTH DAILY 90 tablet 0  . LUTEIN-ZEAXANTHIN PO Take 1 tablet by mouth daily.    . magnesium 30 MG tablet Take 30 mg by mouth daily.    . Misc Natural Products (TART CHERRY ADVANCED) CAPS Take 1 capsule by mouth daily.    . Multiple Vitamins-Minerals (PRESERVISION AREDS PO) Take 2 capsules by mouth  daily.    . naproxen sodium (ANAPROX) 220 MG tablet Take 220-440 mg by mouth daily as needed (pain). Reported on 04/08/2016     No current facility-administered medications for this visit.     OBJECTIVE: Middle-aged white woman Who appears well Vitals:   07/29/16 1252  BP: 127/71  Pulse: 70  Resp: 18  Temp: 98.4 F (36.9 C)     Body mass index is 23.16 kg/m.    ECOG FS:0 - Asymptomatic   Sclerae unicteric, pupils round and equal Oropharynx clear and moist-- no thrush or other lesions No cervical or supraclavicular adenopathy Lungs no rales or rhonchi Heart regular rate and rhythm Abd soft, nontender, positive bowel sounds MSK no focal spinal tenderness, no upper extremity lymphedema Neuro: nonfocal, well oriented, appropriate affect Breasts: The right breast is unremarkable. The left breast is status post lumpectomy. It is also status post radiation. There is no evidence of local recurrence. The left axilla is benign.   LAB RESULTS:  CMP      Component Value Date/Time   NA 145 07/22/2016 0926   K 4.1 07/22/2016 0926   CL 106 01/13/2015 0822   CO2 27 07/22/2016 0926   GLUCOSE 82 07/22/2016 0926   BUN 14.5 07/22/2016 0926   CREATININE 0.8 07/22/2016 0926   CALCIUM 9.0 07/22/2016 0926   PROT 6.7 07/22/2016 0926   ALBUMIN 3.8 07/22/2016 0926   AST 18 07/22/2016 0926   ALT 15 07/22/2016 0926   ALKPHOS 65 07/22/2016 0926   BILITOT 0.33 07/22/2016 0926   GFRNONAA 89 (L) 01/13/2015 0822   GFRAA >90 01/13/2015 0822    INo results found for: SPEP, UPEP  Lab Results  Component Value Date   WBC 3.6 (L) 07/22/2016   NEUTROABS 2.2 07/22/2016   HGB 12.7 07/22/2016   HCT 38.2 07/22/2016   MCV 95.3 07/22/2016   PLT 177 07/22/2016      Chemistry      Component Value Date/Time   NA 145 07/22/2016 0926   K 4.1 07/22/2016 0926   CL 106 01/13/2015 0822   CO2 27 07/22/2016 0926   BUN 14.5 07/22/2016 0926   CREATININE 0.8 07/22/2016 0926      Component Value Date/Time   CALCIUM 9.0 07/22/2016 0926   ALKPHOS 65 07/22/2016 0926   AST 18 07/22/2016 0926   ALT 15 07/22/2016 0926   BILITOT 0.33 07/22/2016 0926       No results found for: LABCA2  No components found for: LABCA125  No results for input(s): INR in the last 168 hours.  Urinalysis    Component Value Date/Time   LABSPEC 1.005 04/03/2015 1451   PHURINE 7.5 04/03/2015 1451   PHURINE 7.5 08/09/2007 2121   GLUCOSEU Negative 04/03/2015 1451   HGBUR Trace 04/03/2015 1451   HGBUR SMALL (A) 08/09/2007 2121   BILIRUBINUR neg 04/08/2016 1353   BILIRUBINUR Negative 04/03/2015 1451   KETONESUR Negative 04/03/2015 1451   [place] 08/09/2007 2121   PROTEINUR neg 04/08/2016 1353   PROTEINUR Negative 04/03/2015 1451   PROTEINUR NEGATIVE 08/09/2007 2121   UROBILINOGEN negative 04/08/2016 1353   UROBILINOGEN 0.2 04/03/2015 1451   NITRITE neg 04/08/2016 1353   NITRITE Negative 04/03/2015 1451   NITRITE NEGATIVE 08/09/2007 2121   LEUKOCYTESUR Negative  04/08/2016 1353   LEUKOCYTESUR Small 04/03/2015 1451    STUDIES: No results found.  ASSESSMENT: 60 y.o. 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  Left breast boost/ 16 Gy at 2 Gy per fraction x 8 fractions  (4) genetics counselor felt the risk of a familial mutation was sufficiently low testing could be omitted  (5) continuing trastuzumab every 3 weeks to complete a year, last dose 01/08/2016  (a) most recent echocardiogram 10/11/2015 shows an ejection fraction in the 65%-70% range  (6) left eye visual disturbance with negative brain MRI with and without contrast 10/12/2015  (7) [facility] density at [facility] 06/20/2016) osteoporosis with a T score of -2.5. He is here  PLAN: Rhonda Gardner is now a little over a year out from definitive surgery or her breast cancer, with no evidence of disease recurrence. This is favorable.  I don't have a simple explanation as to why she may still have some blurred vision. Usually with chemotherapy this is due to accommodation problems, but she is now a year and a half out from her last chemotherapy without significant improvement. I think further ophthalmological evaluation may be a good idea and we  discussed that today.  As far as her osteoporosis is concerned, she is already on vitamin D and has an excellent walking program. Accordingly we need to move towards the pharmacologic option and we discussed bisphosphonates, both oral and intravenous, as well as denosumab, and the less practical options of Forteo and calcitonin.  After much discussion we decided to go with zolendronate. She will receive this every 6 months for 2 years and then yearly for a total of 4-5 years through 2 bone density rounds). An additional benefit of zolendronate is the fact that in postmenopausal woman it has been found to reproduce breast cancer recurrence biopsies 3%.  We discussed the possible toxicities, side effects and complications of this agent and she wishes to start in November. The orders were placed today. She will take additional calcium on the day of treatment. She will return to see me in 6 months and she will receive her second dose at that time.  Chauncey Cruel, MD   07/29/2016 6:29 PM

## 2016-07-29 ENCOUNTER — Ambulatory Visit (HOSPITAL_BASED_OUTPATIENT_CLINIC_OR_DEPARTMENT_OTHER): Payer: BLUE CROSS/BLUE SHIELD | Admitting: Oncology

## 2016-07-29 VITALS — BP 127/71 | HR 70 | Temp 98.4°F | Resp 18 | Ht 66.5 in | Wt 145.7 lb

## 2016-07-29 DIAGNOSIS — Z171 Estrogen receptor negative status [ER-]: Principal | ICD-10-CM

## 2016-07-29 DIAGNOSIS — H538 Other visual disturbances: Secondary | ICD-10-CM | POA: Diagnosis not present

## 2016-07-29 DIAGNOSIS — C50412 Malignant neoplasm of upper-outer quadrant of left female breast: Secondary | ICD-10-CM

## 2016-07-29 DIAGNOSIS — M818 Other osteoporosis without current pathological fracture: Secondary | ICD-10-CM

## 2016-07-29 DIAGNOSIS — Z853 Personal history of malignant neoplasm of breast: Secondary | ICD-10-CM | POA: Diagnosis not present

## 2016-07-29 DIAGNOSIS — M810 Age-related osteoporosis without current pathological fracture: Secondary | ICD-10-CM

## 2016-08-14 HISTORY — PX: FOOT SURGERY: SHX648

## 2016-08-16 ENCOUNTER — Ambulatory Visit: Payer: BLUE CROSS/BLUE SHIELD

## 2016-08-16 IMAGING — MR MR BREAST BILATERAL W WO CONTRAST
9 of 13 series · 29 of 48 positions shown · IV contrast (13ml Multihance)
Comparison: Prior mammograms and left breast ultrasound

CLINICAL DATA: Newly diagnosed left breast 2 o'clock location
invasive ductal carcinoma. Preoperative MRI.

LABS:  Not performed
EXAM:
BILATERAL BREAST MRI WITH AND WITHOUT CONTRAST
TECHNIQUE: Multiplanar, multisequence MR images of both breasts were obtained
prior to and following the intravenous administration of 13 ml of
MultiHance.

[Series 2: T2 · axial · 3.0mm · 0.94mm/px · 1 of 64 slices shown]
[im 1/64]
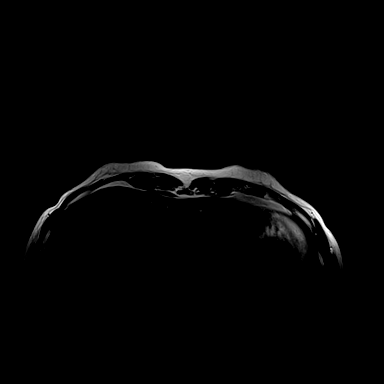

[Series 3: t2_tirm_tra ipat (a-p) · axial · 3.0mm · 0.70mm/px · 1 of 64 slices shown]
[im 1/64]
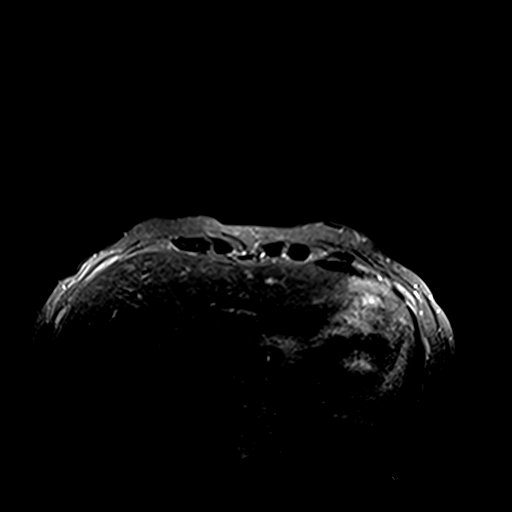

[Series 4: fl3d pre-cm no · axial · non-contrast · 0.9mm · 0.94mm/px · z∈[-86,+100]mm · 5 of 208 slices shown]
[im 1/208]
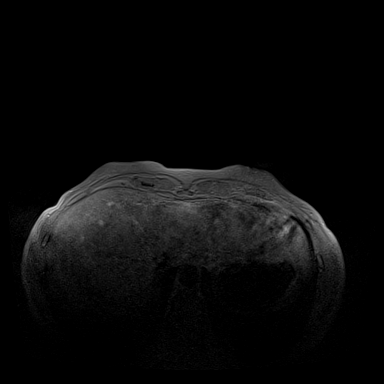
[im 52/208]
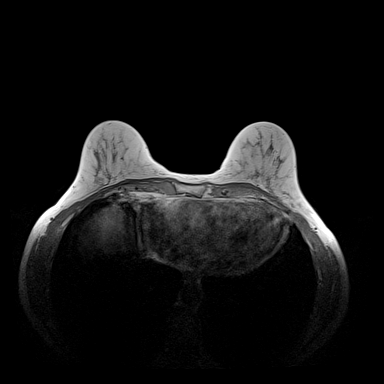
[im 104/208]
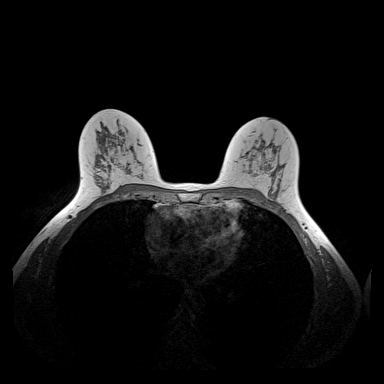
[im 156/208]
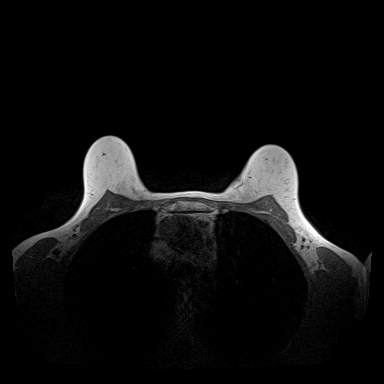
[im 208/208]
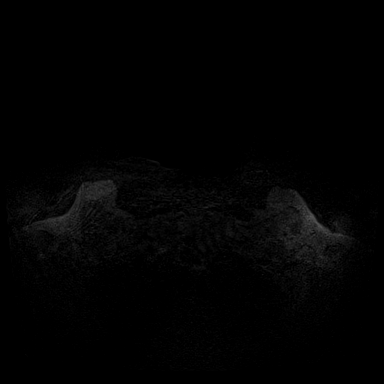

[Series 5: fl3d pre-cm · axial · non-contrast · 0.9mm · 0.87mm/px · z∈[-86,+100]mm · 5 of 208 slices shown]
[im 1/208]
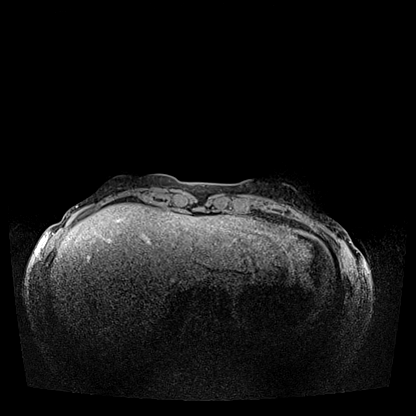
[im 52/208]
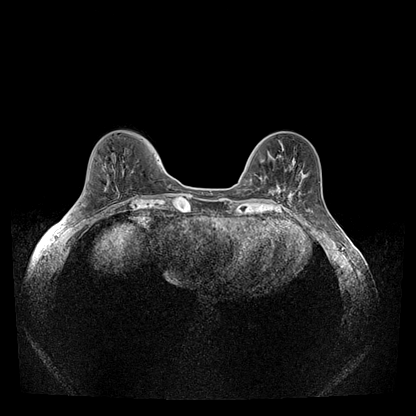
[im 104/208]
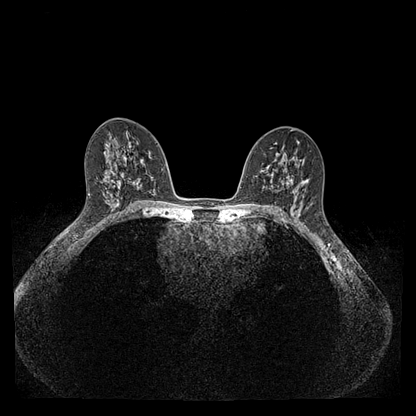
[im 156/208]
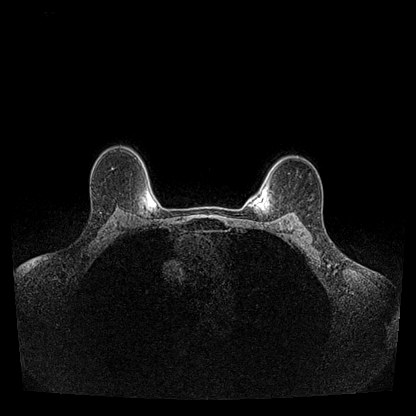
[im 208/208]
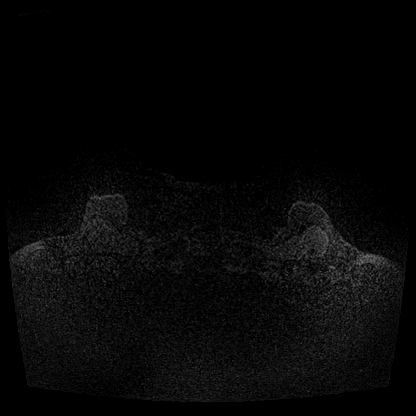

[Series 6: fl3d post-cm 20 · axial · 0.9mm · 0.87mm/px · z∈[-86,+100]mm · 5 of 208 slices shown (1 of 3)]
[im 1/208]
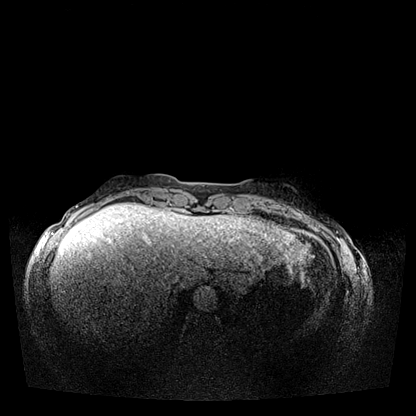
[im 52/208]
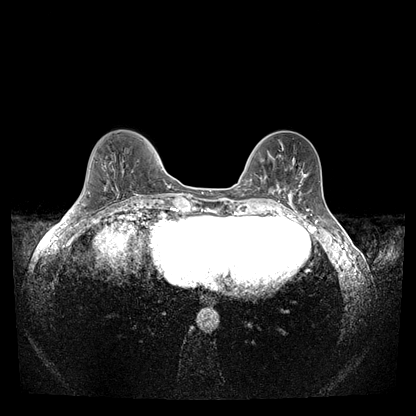
[im 104/208]
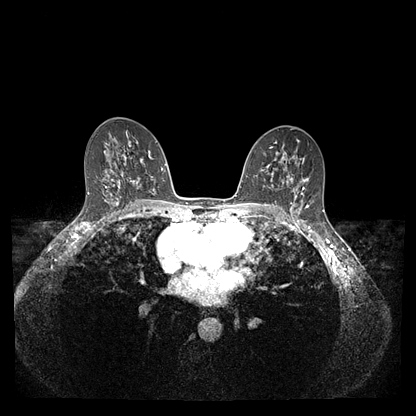
[im 156/208]
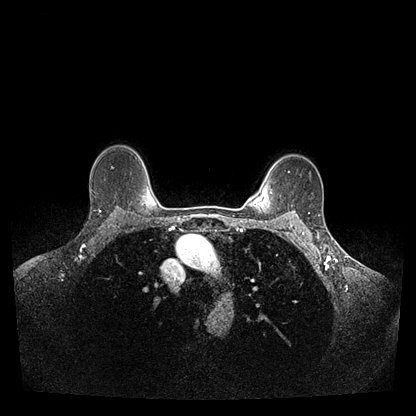
[im 208/208]
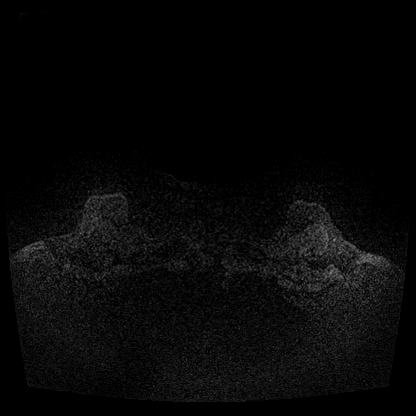

[Series 7: fl3d post-cm 20 · axial · 0.9mm · 0.87mm/px · z∈[-86,+100]mm · 5 of 208 slices shown (2 of 3)]
[im 1/208]
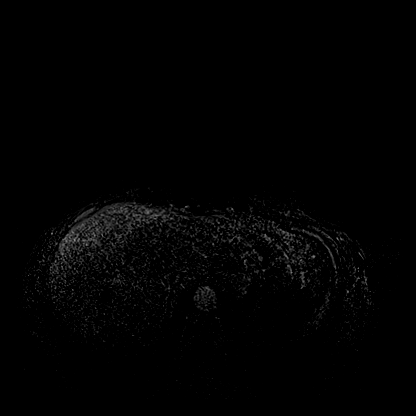
[im 52/208]
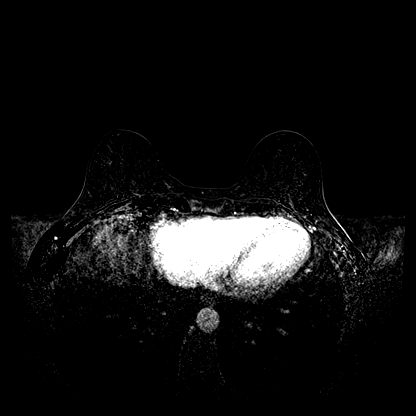
[im 104/208]
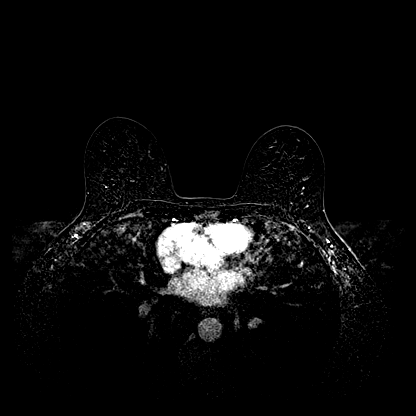
[im 156/208]
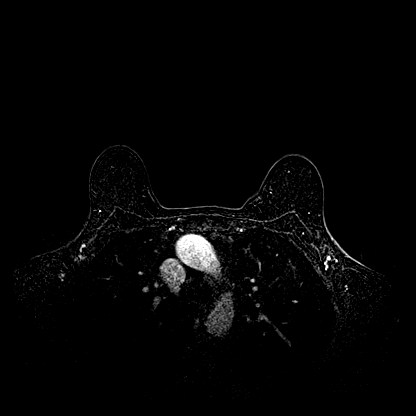
[im 208/208]
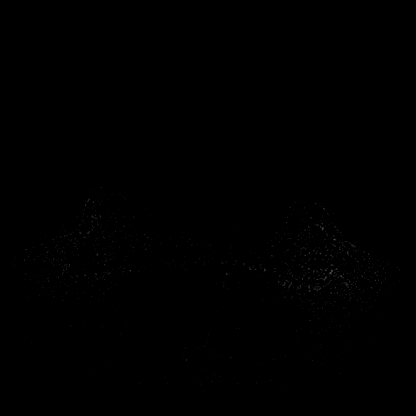

[Series 8: fl3d post-cm 20 · axial · 187.2mm · 0.87mm/px · 1 of 1 slices shown (3 of 3)]
[im 1/1]
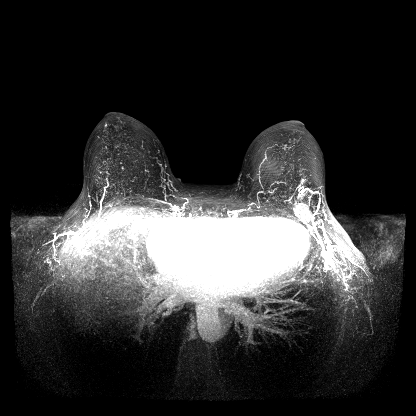

[Series 9: fl3d post-cm 3min · axial · 0.9mm · 0.87mm/px · z∈[-86,+100]mm · 5 of 208 slices shown]
[im 1/208]
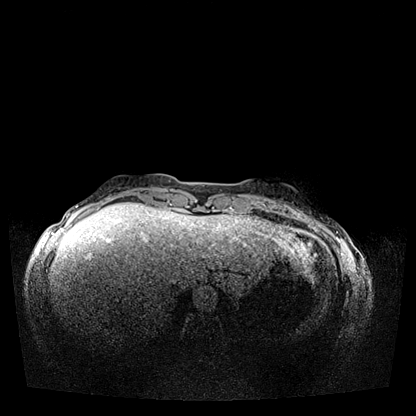
[im 52/208]
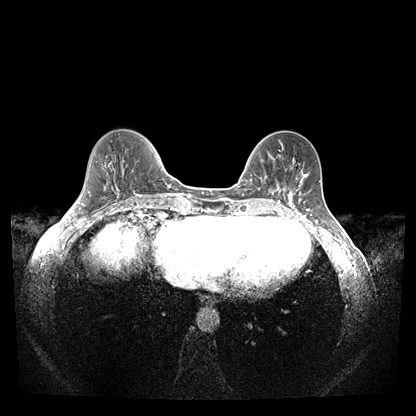
[im 104/208]
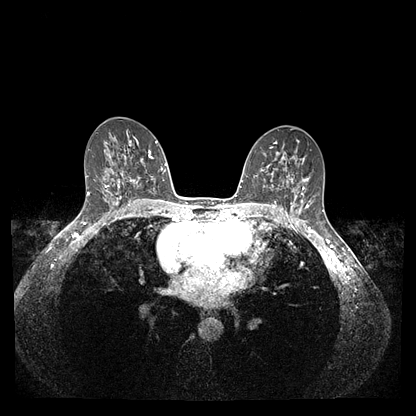
[im 156/208]
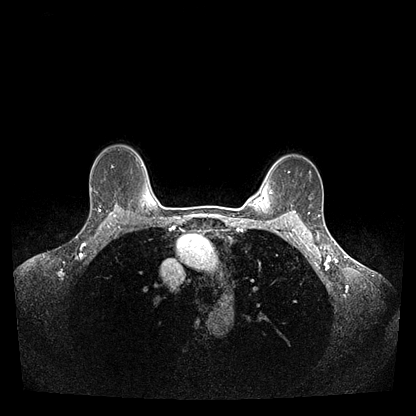
[im 208/208]
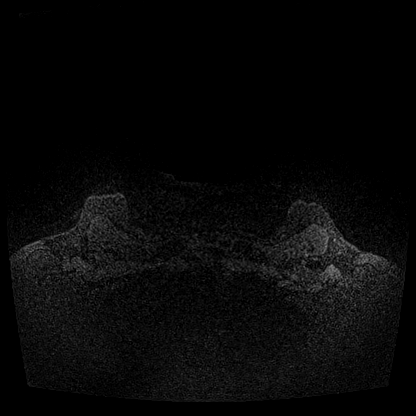

[Series 10: fl3d post-cm 3min_sub · axial · 0.9mm · 0.87mm/px · 1 of 208 slices shown]
[im 1/208]
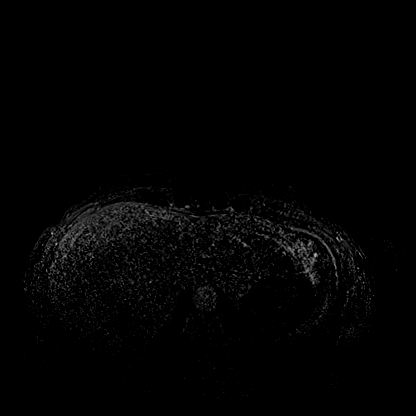

[29 of 48 positions shown; findings below may reference images not displayed]

THREE-DIMENSIONAL MR IMAGE RENDERING ON INDEPENDENT WORKSTATION:

Three-dimensional MR images were rendered by post-processing of the
original MR data on an independent workstation. The
three-dimensional MR images were interpreted, and findings are
reported in the following complete MRI report for this study. Three
dimensional images were evaluated at the independent [employer]
workstation
FINDINGS: Breast composition: c.  Heterogeneous fibroglandular tissue.

Background parenchymal enhancement: Minimal

Right breast: No mass or abnormal enhancement.

Left breast: In the left breast 2 o'clock location posterior depth
is a bilobed enhancing mass with adjacent clip artifact measuring
overall 2.5 x 1.6 x 1.5 cm, demonstrating wash-in type enhancement
kinetics. This corresponds to the biopsy-proven malignancy. This
directly abuts the subjacent left pectoralis major muscle and
superficial involvement is not excluded although there is no
enhancement within the left pectoralis major muscle or underlying
chest wall structures.

Lymph nodes: No abnormal appearing lymph nodes.

Ancillary findings: 1 cm T2 hyperintense hepatic cyst incidentally
noted at the dome of the right hepatic lobe.
IMPRESSION: 2.5 cm enhancing left breast 2 o'clock location mass compatible with
biopsy-proven malignancy. This directly abuts the left pectoralis
major muscle and superficial muscle involvement is possible although
there is no abnormal intramuscular enhancement or enhancement of the
underlying chest wall structures.

No MRI evidence for contralateral or multifocal/ multicentric
malignancy.

RECOMMENDATION:
Treatment plan

BI-RADS CATEGORY  6: Known biopsy-proven malignancy.

## 2016-08-21 ENCOUNTER — Ambulatory Visit (INDEPENDENT_AMBULATORY_CARE_PROVIDER_SITE_OTHER): Payer: BLUE CROSS/BLUE SHIELD | Admitting: Podiatry

## 2016-08-21 ENCOUNTER — Encounter: Payer: Self-pay | Admitting: Podiatry

## 2016-08-21 ENCOUNTER — Ambulatory Visit (INDEPENDENT_AMBULATORY_CARE_PROVIDER_SITE_OTHER): Payer: BLUE CROSS/BLUE SHIELD

## 2016-08-21 VITALS — BP 124/68 | HR 76 | Resp 16 | Ht 67.0 in | Wt 140.0 lb

## 2016-08-21 DIAGNOSIS — M79671 Pain in right foot: Secondary | ICD-10-CM | POA: Diagnosis not present

## 2016-08-21 DIAGNOSIS — M2021 Hallux rigidus, right foot: Secondary | ICD-10-CM | POA: Diagnosis not present

## 2016-08-21 NOTE — Progress Notes (Signed)
   Subjective:    Patient ID: Rhonda Gardner, female    DOB: 05/04/1956, 60 y.o.   MRN: [id number]  HPI Chief Complaint  Patient presents with  . Toe Pain    Right foot; great toe-joint; x2 months     Review of Systems  Eyes: Positive for visual disturbance.  All other systems reviewed and are negative.      Objective:   Physical Exam        Assessment & Plan:

## 2016-08-21 NOTE — Patient Instructions (Signed)
Pre-Operative Instructions  Congratulations, you have decided to take an important step to improving your quality of life.  You can be assured that the doctors of [facility] will be with you every step of the way.  Plan to be at the surgery center/hospital at least 1 (one) hour prior to your scheduled time unless otherwise directed by the surgical center/hospital staff.  You must have a responsible adult accompany you, remain during the surgery and drive you home.  Make sure you have directions to the surgical center/hospital and know how to get there on time. For hospital based surgery you will need to obtain a history and physical form from your family physician within 1 month prior to the date of surgery- we will give you a form for you primary physician.  We make every effort to accommodate the date you request for surgery.  There are however, times where surgery dates or times have to be moved.  We will contact you as soon as possible if a change in schedule is required.   No Aspirin/Ibuprofen for one week before surgery.  If you are on aspirin, any non-steroidal anti-inflammatory medications (Mobic, Aleve, Ibuprofen) you should stop taking it 7 days prior to your surgery.  You make take Tylenol  For pain prior to surgery.  Medications- If you are taking daily heart and blood pressure medications, seizure, reflux, allergy, asthma, anxiety, pain or diabetes medications, make sure the surgery center/hospital is aware before the day of surgery so they may notify you which medications to take or avoid the day of surgery. No food or drink after midnight the night before surgery unless directed otherwise by surgical center/hospital staff. No alcoholic beverages 24 hours prior to surgery.  No smoking 24 hours prior to or 24 hours after surgery. Wear loose pants or shorts- loose enough to fit over bandages, boots, and casts. No slip on shoes, sneakers are best. Bring your boot with you to the  surgery center/hospital.  Also bring crutches or a walker if your physician has prescribed it for you.  If you do not have this equipment, it will be provided for you after surgery. If you have not been contracted by the surgery center/hospital by the day before your surgery, call to confirm the date and time of your surgery. Leave-time from work may vary depending on the type of surgery you have.  Appropriate arrangements should be made prior to surgery with your employer. Prescriptions will be provided immediately following surgery by your doctor.  Have these filled as soon as possible after surgery and take the medication as directed. Remove nail polish on the operative foot. Wash the night before surgery.  The night before surgery wash the foot and leg well with the antibacterial soap provided and water paying special attention to beneath the toenails and in between the toes.  Rinse thoroughly with water and dry well with a towel.  Perform this wash unless told not to do so by your physician.  Enclosed: 1 Ice pack (please put in freezer the night before surgery)   1 Hibiclens skin cleaner   Pre-op Instructions  If you have any questions regarding the instructions, do not hesitate to call our office.  [place]: [place], [place] [address] [place]: [address], [place], [place] [address] [phone]  [place]: [address] [place] [address] [phone]   Dr. Ila Mcgill DPM, Dr. Celesta Gentile DPM, Dr. Lanelle Bal DPM, Dr. Landis Martins  DPM

## 2016-08-22 NOTE — Progress Notes (Signed)
Subjective:     Patient ID: Rhonda Gardner, female   DOB: November 07, 1955, 60 y.o.   MRN: [id number]  HPI patient presents stating her right foot has started to hurt her quite a bit and the left one did great with surgery of a number of years ago. She states it's becoming increasingly swollen and painful and range of motion is reduced ready for that   Review of Systems  All other systems reviewed and are negative.      Objective:   Physical Exam  Constitutional: She is oriented to person, place, and time.  Cardiovascular: Intact distal pulses.   Musculoskeletal: Normal range of motion.  Neurological: She is oriented to person, place, and time.  Skin: Skin is warm.  Nursing note and vitals reviewed.  neurovascular status found to be intact with muscle strength adequate range of motion within normal limits with patient noted to have reduced range of motion first MPJ right with mild crepitus in the joint and inflammation pain around the first MPJ with palpation. The left shows a well-healed surgical site with excellent range of motion and patient's found to have good digital perfusion and is well oriented 3     Assessment:     Structural hallux limitus deformity which has gotten worse over the last several years right with well corrected left    Plan:     H&P x-rays reviewed condition discussed. At this point I do think a biplanar-type osteotomy to shorten the first metatarsal would be of great benefit to her as the other one did extremely well and that there is the beginnings of joint narrowing and spur formation and she wants to get it corrected early. At this point I allowed her to read consent form concerning correction going over all possible complications associated with surgery and the fact that recovery take approximate 6 months to one year and there is no guarantee as to quality of the joint and possibility long-term for joint fusion or implantation procedure is present dispensed  air fracture walker that I want her to get used to prior to surgery and scheduled for outpatient surgery  X-ray report indicated that there is elongation of the first metatarsal segment right with mild dorsal spurring and narrowing of the joint surface

## 2016-08-23 ENCOUNTER — Ambulatory Visit (HOSPITAL_BASED_OUTPATIENT_CLINIC_OR_DEPARTMENT_OTHER): Payer: BLUE CROSS/BLUE SHIELD

## 2016-08-23 VITALS — BP 118/64 | HR 80 | Temp 98.0°F | Resp 16

## 2016-08-23 DIAGNOSIS — M810 Age-related osteoporosis without current pathological fracture: Secondary | ICD-10-CM

## 2016-08-23 DIAGNOSIS — Z171 Estrogen receptor negative status [ER-]: Secondary | ICD-10-CM

## 2016-08-23 DIAGNOSIS — M818 Other osteoporosis without current pathological fracture: Secondary | ICD-10-CM

## 2016-08-23 DIAGNOSIS — C50412 Malignant neoplasm of upper-outer quadrant of left female breast: Secondary | ICD-10-CM

## 2016-08-23 MED ORDER — SODIUM CHLORIDE 0.9 % IV SOLN: INTRAVENOUS | Status: DC

## 2016-08-23 MED ORDER — SODIUM CHLORIDE 0.9 % IV SOLN
INTRAVENOUS | Status: DC
  Administered 2016-08-23: 15:00:00 via INTRAVENOUS

## 2016-08-23 MED ORDER — ZOLEDRONIC ACID 4 MG/100ML IV SOLN
4.0000 mg | Freq: Once | INTRAVENOUS | Status: AC
  Administered 2016-08-23: 4 mg via INTRAVENOUS
  Filled 2016-08-23: qty 100

## 2016-08-23 NOTE — Patient Instructions (Signed)

## 2016-09-10 ENCOUNTER — Encounter: Payer: Self-pay | Admitting: Podiatry

## 2016-09-10 DIAGNOSIS — M2021 Hallux rigidus, right foot: Secondary | ICD-10-CM | POA: Diagnosis not present

## 2016-09-13 NOTE — Progress Notes (Signed)
DOS 11.28.2017 Shortening Osteotomy with Fixation 1st Metatarsal Right Foot

## 2016-09-18 ENCOUNTER — Encounter: Payer: Self-pay | Admitting: Podiatry

## 2016-09-18 ENCOUNTER — Ambulatory Visit (INDEPENDENT_AMBULATORY_CARE_PROVIDER_SITE_OTHER): Payer: BLUE CROSS/BLUE SHIELD

## 2016-09-18 ENCOUNTER — Ambulatory Visit (INDEPENDENT_AMBULATORY_CARE_PROVIDER_SITE_OTHER): Payer: BLUE CROSS/BLUE SHIELD | Admitting: Podiatry

## 2016-09-18 VITALS — Temp 96.9°F

## 2016-09-18 DIAGNOSIS — M2021 Hallux rigidus, right foot: Secondary | ICD-10-CM

## 2016-09-19 NOTE — Progress Notes (Signed)
Subjective:     Patient ID: Rhonda Gardner, female   DOB: 22-Mar-1956, 60 y.o.   MRN: [id number]  HPI patient states she's doing very well after surgery with minimal discomfort   Review of Systems     Objective:   Physical Exam Neurovascular status intact muscle strength adequate with well coapted incision site right first MPJ with excellent range of motion of 25 dorsiflexion 20 plantar flexion with no pain no crepitus noted with movement is are well healed with no drainage noted and negative Homans sign was noted    Assessment:     Doing well post osteotomy first metatarsal right foot with good alignment noted    Plan:     H&P x-rays reviewed and sterile dressing reapplied along with advice for continued elevation immobilization and range of motion exercises. Reappoint 3 weeks or earlier if needed  X-ray report indicated excellent position of the first metatarsal with pins in place and no indications of current movement with joint congruence and spurs removed

## 2016-09-30 ENCOUNTER — Encounter: Payer: Self-pay | Admitting: Podiatry

## 2016-10-09 ENCOUNTER — Ambulatory Visit (INDEPENDENT_AMBULATORY_CARE_PROVIDER_SITE_OTHER): Payer: Self-pay | Admitting: Podiatry

## 2016-10-09 ENCOUNTER — Ambulatory Visit (INDEPENDENT_AMBULATORY_CARE_PROVIDER_SITE_OTHER): Payer: BLUE CROSS/BLUE SHIELD

## 2016-10-09 DIAGNOSIS — M2021 Hallux rigidus, right foot: Secondary | ICD-10-CM | POA: Diagnosis not present

## 2016-10-09 NOTE — Progress Notes (Signed)
Subjective:     Patient ID: Rhonda Gardner, female   DOB: [birth year], 60 y.o.   MRN: [id number]  HPI patient presents stating that her big toe joint is doing really well on her right foot and she's back into his shoe and she also has a bulk that she wants me to read   Review of Systems     Objective:   Physical Exam Neurovascular status intact muscle strength adequate with patient's right first MPJ doing well with good range of motion and wound edges well coapted and no crepitus    Assessment:     Doing well post biplanar osteotomy right    Plan:     H&P condition reviewed and recommended gradual increase in activity levels with range of motion exercises. Patient be seen back to recheck  X-ray report indicates the osteotomy is healing well with pins in place and no indication of movement

## 2016-11-06 ENCOUNTER — Ambulatory Visit (INDEPENDENT_AMBULATORY_CARE_PROVIDER_SITE_OTHER): Payer: BLUE CROSS/BLUE SHIELD

## 2016-11-06 ENCOUNTER — Ambulatory Visit (INDEPENDENT_AMBULATORY_CARE_PROVIDER_SITE_OTHER): Payer: Self-pay | Admitting: Podiatry

## 2016-11-06 DIAGNOSIS — M2021 Hallux rigidus, right foot: Secondary | ICD-10-CM

## 2016-11-06 NOTE — Progress Notes (Signed)
Subjective:     Patient ID: Rhonda Gardner, female   DOB: 1956/05/29, 61 y.o.   MRN: [id number]  HPI patient states she's doing very well with surgery   Review of Systems     Objective:   Physical Exam Neurovascular status intact with patient's right foot doing well with wound edges well coapted good range of motion and mild edema    Assessment:     Doing well post surgery right    Plan:     Advised on continued range of motion reviewed x-rays and reappoint to recheck  X-ray report indicated osteotomy is healing well pins in place good alignment joint congruence

## 2017-01-01 ENCOUNTER — Ambulatory Visit (INDEPENDENT_AMBULATORY_CARE_PROVIDER_SITE_OTHER): Payer: Self-pay | Admitting: Podiatry

## 2017-01-01 ENCOUNTER — Ambulatory Visit (INDEPENDENT_AMBULATORY_CARE_PROVIDER_SITE_OTHER): Payer: BLUE CROSS/BLUE SHIELD

## 2017-01-01 DIAGNOSIS — M2021 Hallux rigidus, right foot: Secondary | ICD-10-CM | POA: Diagnosis not present

## 2017-01-06 NOTE — Progress Notes (Signed)
Subjective:     Patient ID: Rhonda Gardner, female   DOB: 05-30-1956, 61 y.o.   MRN: [id number]  HPI patient states I'm doing great right with minimal discomfort and I am walking normal distances at this time   Review of Systems     Objective:   Physical Exam Neurovascular status intact negative Homans sign noted with patient's right first MPJ functioning well with 30 dorsiflexion 20 plantarflexion with mild edema but no pain    Assessment:     Doing well post hallux limitus repair right    Plan:     Advised patient on continuation of physical therapy supportive shoe usage and reappoint for [place] to recheck and took final x-rays today   X-ray report indicated the osteotomy is healing well with pins in place joint congruence spurs removed and no pathology

## 2017-02-10 ENCOUNTER — Ambulatory Visit (HOSPITAL_BASED_OUTPATIENT_CLINIC_OR_DEPARTMENT_OTHER): Payer: BLUE CROSS/BLUE SHIELD | Admitting: Oncology

## 2017-02-10 ENCOUNTER — Ambulatory Visit (HOSPITAL_BASED_OUTPATIENT_CLINIC_OR_DEPARTMENT_OTHER): Payer: BLUE CROSS/BLUE SHIELD

## 2017-02-10 ENCOUNTER — Other Ambulatory Visit (HOSPITAL_BASED_OUTPATIENT_CLINIC_OR_DEPARTMENT_OTHER): Payer: BLUE CROSS/BLUE SHIELD

## 2017-02-10 VITALS — BP 130/66 | HR 69 | Temp 98.3°F | Resp 18 | Ht 67.0 in | Wt 150.1 lb

## 2017-02-10 DIAGNOSIS — Z171 Estrogen receptor negative status [ER-]: Secondary | ICD-10-CM

## 2017-02-10 DIAGNOSIS — C50412 Malignant neoplasm of upper-outer quadrant of left female breast: Secondary | ICD-10-CM | POA: Diagnosis not present

## 2017-02-10 DIAGNOSIS — M810 Age-related osteoporosis without current pathological fracture: Secondary | ICD-10-CM

## 2017-02-10 DIAGNOSIS — M818 Other osteoporosis without current pathological fracture: Secondary | ICD-10-CM

## 2017-02-10 LAB — COMPREHENSIVE METABOLIC PANEL
ALT: 19 U/L (ref 0–55)
ANION GAP: 12 meq/L — AB (ref 3–11)
AST: 23 U/L (ref 5–34)
Albumin: 4.5 g/dL (ref 3.5–5.0)
Alkaline Phosphatase: 67 U/L (ref 40–150)
BILIRUBIN TOTAL: 0.52 mg/dL (ref 0.20–1.20)
BUN: 17 mg/dL (ref 7.0–26.0)
CHLORIDE: 103 meq/L (ref 98–109)
CO2: 26 meq/L (ref 22–29)
CREATININE: 0.8 mg/dL (ref 0.6–1.1)
Calcium: 9.7 mg/dL (ref 8.4–10.4)
EGFR: 77 mL/min/{1.73_m2} — ABNORMAL LOW (ref >90)
Glucose: 83 mg/dl (ref 70–140)
Potassium: 4.1 mEq/L (ref 3.5–5.1)
Sodium: 142 mEq/L (ref 136–145)
TOTAL PROTEIN: 7.3 g/dL (ref 6.4–8.3)

## 2017-02-10 LAB — CBC WITH DIFFERENTIAL/PLATELET
BASO%: 0.5 % (ref 0.0–2.0)
Basophils Absolute: 0 10*3/uL (ref 0.0–0.1)
EOS%: 3.1 % (ref 0.0–7.0)
Eosinophils Absolute: 0.2 10*3/uL (ref 0.0–0.5)
HEMATOCRIT: 40.2 % (ref 34.8–46.6)
HGB: 13.5 g/dL (ref 11.6–15.9)
LYMPH#: 0.9 10*3/uL (ref 0.9–3.3)
LYMPH%: 17.2 % (ref 14.0–49.7)
MCH: 31.3 pg (ref 25.1–34.0)
MCHC: 33.6 g/dL (ref 31.5–36.0)
MCV: 93.1 fL (ref 79.5–101.0)
MONO#: 0.5 10*3/uL (ref 0.1–0.9)
MONO%: 8.8 % (ref 0.0–14.0)
NEUT%: 70.4 % (ref 38.4–76.8)
NEUTROS ABS: 3.9 10*3/uL (ref 1.5–6.5)
PLATELETS: 183 10*3/uL (ref 145–400)
RBC: 4.32 10*6/uL (ref 3.70–5.45)
RDW: 13.4 % (ref 11.2–14.5)
WBC: 5.5 10*3/uL (ref 3.9–10.3)

## 2017-02-10 MED ORDER — ZOLEDRONIC ACID 4 MG/100ML IV SOLN
4.0000 mg | Freq: Once | INTRAVENOUS | Status: AC
  Administered 2017-02-10: 4 mg via INTRAVENOUS
  Filled 2017-02-10: qty 100

## 2017-02-10 NOTE — Patient Instructions (Signed)

## 2017-02-10 NOTE — Progress Notes (Addendum)
[facility]  [phone] [phone]) [phone]     ID: Rhonda Gardner DOB: Dec 07, 1955  MR#: [id number]  DXI#:[id number]  Patient Care Team: Rita Ohara, MD as PCP - General (Family Medicine) Chauncey Cruel, MD as Consulting Physician (Oncology) Thea Silversmith, MD (Inactive) as Consulting Physician (Radiation Oncology) Mauro Kaufmann, RN as Registered Nurse Rockwell Germany, RN as Registered Nurse Holley Bouche, NP as Nurse Practitioner (Nurse Practitioner) Autumn Messing III, MD as Consulting Physician (General Surgery) PCP: Vikki Ports, MD OTHER MD: Rutherford Guys, MD  CHIEF COMPLAINT: HER-2 positive breast cancer  CURRENT TREATMENT:  Zolendronate   BREAST CANCER HISTORY: From the original intake note:  "Rhonda Gardner" had not had a physical exam in quite some time, and had not had a mammogram since 2020, when she was evaluated by Dr. Tomi Bamberger 12/01/2014. Dr. Tomi Bamberger was able to palpate a 2 cm firm mass at the 3:00 position in the left breast. The patient herself has been aware of multiple breast masses and does have a history of fibrocystic change, with multiple prior benign biopsies. She did not feel this particular mass had changed recently.   Dr. Tomi Bamberger arranged for bilateral diagnostic mammography and left breast ultrasonography at the breast Center 12/15/2014. This shows the breast density to be category C. In the left breast upper outer quadrant there was a bilobed mass measuring approximately 2 cm. This was palpable. Ultrasound confirmed an irregular microlobulated and hypoechoic mass measuring 2.1 cm at the 2:00 location 6 cm from the nipple. There was no left axillary adenopathy.  Biopsy of the mass in question 12/19/2014 showed ([id number]) invasive ductal carcinoma, grade 2 or 3, estrogen and progesterone receptor negative, with an MIB-1 of 38%, and equivocal HER-2 amplification, the signals ratio being 1.57, the number per cell 4.25.  On 12/27/2014 the  patient underwent bilateral breast MRI. The mass in question at the 2:00 location of the left breast measured 2.5 cm. It directly abuts the left pectoralis major. There is no enhancement in the muscle itself. There were no abnormal appearing lymph nodes. There was an incidentally noted 1 cm hepatic cyst at the dome of the right liver lobe.  Her subsequent history is as detailed below  INTERVAL HISTORY: Rhonda Gardner returns today for follow-up of her estrogen receptor negative breast cancer. Interval history is generally unremarkable as far as breast cancer is concerned. She has noted no change in either breast.  She tolerated the first zolendronate dose November 2017 with minimal aches and pains which she treated with Naprosyn.   REVIEW OF SYSTEMS: Rhonda Gardner is not walking as much as she did before and is not exercising regularly. Accordingly she has gained a little weight which concerns her. She does eat a very healthy diet. She continues to have visual problems chiefly in the left eye. She saw her optometrist and then her ophthalmologist, Dr. Satira Sark. She has also seen Dr. Gershon Crane regarding this. They have tried some drops including Restasis, but she feels possibly the problem is getting worse in that the blurred vision is happening earlier in the day than before. Aside from that she had surgery to the right foot and she's had "a bump" in the front of the right foot she wanted me to feel. A detailed review of systems today was otherwise stable  PAST MEDICAL HISTORY: Past Medical History:  Diagnosis Date  . Allergy 2006   chemical cauterization in spring 2006, and 2nd treatment with good results.(Dr.Shoemaker)  . Bipolar disorder ([place])   .  Breast cancer ([place]) 12/2014   invasive ductal carcinoma (LEFT)  . Breast cancer of upper-outer quadrant of left female breast ([place]) 12/22/2014  . Breast hematoma 12/19/2015   had left breast aspiration of 4cm hematoma-path indicates benign hematoma  . Osteoporosis  06/20/2016   T-2.5'@spine'  on Dexa ([place])  . PONV (postoperative nausea and vomiting)     PAST SURGICAL HISTORY: Past Surgical History:  Procedure Laterality Date  . BREAST BIOPSY Bilateral    benign and a right stereotatic breast biopsy.  . fibroid excision  age 10   prolapsed fibroid removed  . FOOT SURGERY Left 2014   Dr. Paulla Dolly  . [facility] REMOVAL N/A 02/15/2016   Procedure: REMOVAL PORT-A-CATH;  Surgeon: Autumn Messing III, MD;  Location: [place];  Service: General;  Laterality: N/A;  . RADIOACTIVE SEED GUIDED MASTECTOMY WITH AXILLARY SENTINEL LYMPH NODE BIOPSY Left 05/25/2015   Procedure: LEFT BREAST LUMPECTOMY WITH RADIOACTIVE SEED AND SENTINEL LYMPH NODE BIOPSY;  Surgeon: Autumn Messing III, MD;  Location: [place];  Service: General;  Laterality: Left;    FAMILY HISTORY Family History  Problem Relation Age of Onset  . Cancer Father     skin - non melanoma - farmer  . Heart disease Father   . Hypertension Mother   . Arthritis Mother     osteoarthritis  . Colon cancer Mother   . Macular degeneration Mother     wet and dry  . Cancer Mother 67    colon cancer at age 67  . Osteoporosis Sister     osteopenia (states it resolved)  . Cancer Maternal Aunt     lung cancer  . Cancer Paternal Aunt 31    ? stomach cancer  . Autism Sister   . Mental retardation Sister   . Blindness Sister     secondary to retrolental fibroplasia  . Goiter Paternal Aunt   . Diabetes Paternal Aunt    the patient's father lived to be 95. He had a history of nonmelanoma skin cancers. [name] mother is living at age 68. She was diagnosed with colon cancer in her 23s. A paternal aunt may have had stomach cancer. Maternal aunt developed lung cancer in her 34s. The patient has one brother, 2 sisters. One of her sisters is blind and mentally retarded. There is no history of breast or ovarian cancer in the family.  GYNECOLOGIC HISTORY:  No LMP recorded. Patient is  postmenopausal. Menarche age 73. The patient is GX P0. She stopped having periods in her early 82s. She did not take hormone replacement. She took oral contraceptives remotely for over 5 years with no complications.  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 Her husband [name] worked in IT originally but now runs a family firm. It's just the 2 of them at home, plus a chocolate lab.    ADVANCED DIRECTIVES: In place   HEALTH MAINTENANCE: Social History  Substance Use Topics  . Smoking status: Never Smoker  . Smokeless tobacco: Never Used  . Alcohol use 0.0 oz/week     Comment: 1-2 drinks/week, usually wine     Colonoscopy: In [place] under Dr. Lavina Hamman, date not entirely clear  PAP: February 2016  Bone density: Under [facility], results not currently available  Lipid panel:  Allergies  Allergen Reactions  . Adhesive [Tape] Rash    Pt is sensitive to any kind of adhesive tape products.    Current Outpatient Prescriptions  Medication Sig Dispense Refill  .  cetirizine (ZYRTEC) 10 MG tablet Take 10 mg by mouth daily.    . Cholecalciferol (VITAMIN D) 2000 UNITS tablet Take 2,000 Units by mouth daily.    Marland Kitchen LAMICTAL 200 MG tablet TAKE 1/2 TABLET BY MOUTH DAILY 90 tablet 0  . LUTEIN-ZEAXANTHIN PO Take 1 tablet by mouth daily.    . magnesium 30 MG tablet Take 30 mg by mouth daily.    . Misc Natural Products (TART CHERRY ADVANCED) CAPS Take 1 capsule by mouth daily.    . Multiple Vitamins-Minerals (PRESERVISION AREDS PO) Take 2 capsules by mouth daily.    . naproxen sodium (ANAPROX) 220 MG tablet Take 220-440 mg by mouth daily as needed (pain). Reported on 04/08/2016    . promethazine (PHENERGAN) 25 MG tablet Take 25 mg by mouth every 6 (six) hours as needed for nausea or vomiting.     No current facility-administered medications for this visit.     OBJECTIVE: Middle-aged white woman In no acute distress Vitals:   02/10/17 1226  BP: 130/66  Pulse: 69  Resp: 18    Temp: 98.3 F (36.8 C)     Body mass index is 23.51 kg/m.    ECOG FS:1 - Symptomatic but completely ambulatory   Sclerae unicteric, EOMs intact Oropharynx clear and moist No cervical or supraclavicular adenopathy Lungs no rales or rhonchi Heart regular rate and rhythm Abd soft, nontender, positive bowel sounds MSK no focal spinal tenderness, no upper extremity lymphedema; careful palpation of the dorsum of the right foot finds no "bump", no erythema and no tenderness. There is minimal swelling at the very base of the toes Neuro: nonfocal, well oriented, appropriate affect Breasts: The right breast is benign. Left breast as undergone lumpectomy and radiation, with no evidence of disease recurrence. Both axillae are benign.  LAB RESULTS:  CMP     Component Value Date/Time   NA 145 07/22/2016 0926   K 4.1 07/22/2016 0926   CL 106 01/13/2015 0822   CO2 27 07/22/2016 0926   GLUCOSE 82 07/22/2016 0926   BUN 14.5 07/22/2016 0926   CREATININE 0.8 07/22/2016 0926   CALCIUM 9.0 07/22/2016 0926   PROT 6.7 07/22/2016 0926   ALBUMIN 3.8 07/22/2016 0926   AST 18 07/22/2016 0926   ALT 15 07/22/2016 0926   ALKPHOS 65 07/22/2016 0926   BILITOT 0.33 07/22/2016 0926   GFRNONAA 89 (L) 01/13/2015 0822   GFRAA >90 01/13/2015 0822    INo results found for: SPEP, UPEP  Lab Results  Component Value Date   WBC 5.5 02/10/2017   NEUTROABS 3.9 02/10/2017   HGB 13.5 02/10/2017   HCT 40.2 02/10/2017   MCV 93.1 02/10/2017   PLT 183 02/10/2017      Chemistry      Component Value Date/Time   NA 145 07/22/2016 0926   K 4.1 07/22/2016 0926   CL 106 01/13/2015 0822   CO2 27 07/22/2016 0926   BUN 14.5 07/22/2016 0926   CREATININE 0.8 07/22/2016 0926      Component Value Date/Time   CALCIUM 9.0 07/22/2016 0926   ALKPHOS 65 07/22/2016 0926   AST 18 07/22/2016 0926   ALT 15 07/22/2016 0926   BILITOT 0.33 07/22/2016 0926       No results found for: LABCA2  No components found for:  LABCA125  No results for input(s): INR in the last 168 hours.  Urinalysis    Component Value Date/Time   LABSPEC 1.005 04/03/2015 1451   PHURINE 7.5 04/03/2015 1451  PHURINE 7.5 08/09/2007 2121   GLUCOSEU Negative 04/03/2015 1451   HGBUR Trace 04/03/2015 1451   HGBUR SMALL (A) 08/09/2007 2121   BILIRUBINUR neg 04/08/2016 1353   BILIRUBINUR Negative 04/03/2015 1451   KETONESUR Negative 04/03/2015 1451   [place] 08/09/2007 2121   PROTEINUR neg 04/08/2016 1353   PROTEINUR Negative 04/03/2015 [place] 08/09/2007 2121   UROBILINOGEN negative 04/08/2016 1353   UROBILINOGEN 0.2 04/03/2015 1451   NITRITE neg 04/08/2016 1353   NITRITE Negative 04/03/2015 1451   NITRITE NEGATIVE 08/09/2007 2121   LEUKOCYTESUR Negative 04/08/2016 1353   LEUKOCYTESUR Small 04/03/2015 1451    STUDIES: No results found.  ASSESSMENT: 61 y.o. [place] woman s/p left breast upper outer quadrant biopsy 12/19/2014 for a clinical T2 N0, stage IIA invasive ductal carcinoma, grade 2 or 3, estrogen and progesterone receptor negative, with an MIB-1 of 38%, HER-2 equivocal initially but positive on further testing with a ratio of 3.4  (1) neoadjuvant chemotherapy consisted of carboplatin, docetaxel, trastuzumab and pertuzumab given every 3 weeks for 6 cycles with Neulasta support--started 01/16/2015, completed 05/01/2015  (a) docetaxel switched for gemcitabine for final 2 cycles because of persistent neuropathy.  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  Left breast boost/ 16 Gy at 2 Gy per fraction x 8 fractions  (4) genetics counselor felt the risk of a familial mutation was sufficiently low testing could be omitted  (5) continuing trastuzumab every 3 weeks to complete a year, last  dose 01/08/2016  (a) most recent echocardiogram 10/11/2015 shows an ejection fraction in the 65%-70% range  (6) left eye visual disturbance with negative brain MRI with and without contrast 10/12/2015  (7) bone density at [facility] 06/20/2016) osteoporosis with a T score of -2.5.   (a) zolendronate every 6 months for 2 years started November 2017  PLAN: Rhonda Gardner will soon be 2 years out from definitive surgery for her breast cancer with no evidence of disease recurrence. This is very favorable.  She is tolerating the zolendronate well. We are doing that every 6 months for 2 years then yearly for another 3. That should help the osteoporosis but in addition should decrease the risk of breast cancer recurrence by 2-3%.  I did not feel anything significant in her right foot. I think she is having a little bit of swelling in that salt.  I am more concerned with the eye problem. She has seen a couple of specialists without getting a definitive diagnosis or significant relief. She is going to go back to her ophthalmologist and if that does not do it she will let me know. [facility]  At this point I feel comfortable seeing her on a once a year basis. She knows to call for any problems that may develop before the next visit.  Chauncey Cruel, MD   02/10/2017 12:37 PM   Addendum: Mammography at [facility] 12/26/2016 found a breast density to be category C. There was no evidence of malignancy.

## 2017-02-10 NOTE — Addendum Note (Signed)
Addended by: Chauncey Cruel on: 02/10/2017 01:16 PM   Modules accepted: Orders

## 2017-02-24 ENCOUNTER — Other Ambulatory Visit: Payer: Self-pay | Admitting: Oncology

## 2017-02-24 ENCOUNTER — Telehealth: Payer: Self-pay | Admitting: *Deleted

## 2017-02-24 MED ORDER — LAMICTAL 200 MG PO TABS: 100.0000 mg | ORAL_TABLET | Freq: Every day | ORAL | 0 refills | Status: DC

## 2017-02-24 MED ORDER — LAMICTAL 200 MG PO TABS: 100.0000 mg | ORAL_TABLET | Freq: Every day | ORAL | 4 refills | Status: DC

## 2017-02-24 NOTE — Telephone Encounter (Signed)
Patient advised, rx sent to [facility] and other one printed.

## 2017-02-24 NOTE — Telephone Encounter (Signed)
Chart reviewed--she initially had been seeing Gala Murdoch and Letta Moynahan for her bipolar disorder. When she started seeing Dr. Jana Hakim, he took over the rx's (because Ailene Ravel left).  She is still under his care, but now only needs to be seen once yearly.  It really isn't appropriate for him to be the provider of this medication.  I'm happy to rx--okay to write as stated, however if she has any changes in her moods at all, she will need to get back under the care of psychiatrist (especially since Dr. Caprice Beaver no longer has her regular practice either).

## 2017-02-24 NOTE — Telephone Encounter (Signed)
Patient called and is asking for refill on Lamictal (brand) cannot take generic due to muscle and joint pains. Has been getting this medication from Dr Jana Hakim in the past but since she is no longer in treatment he no longer feels comfortable filling for her and asked that she contact her PCP. She has CPE scheduled for 04/10/17. If you are willing to do this she will need rx for #30 for 200mg  take 1/2 daily sent to [place] and then she is needing a written rx for #56 for the 200mg  1/2 daily for a years supply which she figures would need only 4 RF's on the #56 [place] to [place] pharmacy as this rx is far too expensive to fill locally. Please advise.

## 2017-03-03 ENCOUNTER — Other Ambulatory Visit: Payer: Self-pay | Admitting: *Deleted

## 2017-04-09 NOTE — Progress Notes (Signed)
Chief Complaint  Patient presents with  . Annual Exam    fasting annual exam with pelvic. Just had eye exam. No concerns.     Rhonda Gardner is a 61 y.o. female who presents for a complete physical.    She had been having some vision problems.  Saw a few different eye doctors for complaints of blurred vision and double vision. She finally learned it was chemo-related. Also has dry eyes related to this. Restasis wasn't helpful.  Vision is better when eyes are well lubricated.  She had some episodic dilation of a pupil that she had evaluated (thinks had some sort of chemical contact), resolved.   Left breast cancer (invasive ductal carcinoma), HER-2 positive: She is s/p L breast lumpectomy with radioactive seed and sentinel node biopsy, neoadjuvant chemo (April-July 2016) and adjuvant radiation (September-October 2016) and Herceptin (for a total of a year).  She was last seen by Dr. Jana Hakim in April. She has been getting zolendronate every 6 months (started 06/2016, with plans to give q 6 months for 2 years then yearly for another 3). This is both to treat osteoporosis and also decrease the risk of breast cancer recurrence by 2-3%per Dr. Jana Hakim.  Bipolar disorder: Diagnosed age 52 or 89. She has been stable on her current regimen of lamictal. Moods are well controlled, no side effects. She previously was under the care of Dr. Letta Moynahan and Gala Murdoch (who retired).  We have been managing these medications.  Immunization History  Administered Date(s) Administered  . DTaP 10/14/1954, 10/15/1955, 10/15/1959  . Gamma Globulin 08/23/1988  . Hepatitis A 11/02/1996, 09/29/2002  . Hepatitis B 11/11/2007  . IPV 08/15/1988  . Influenza Split 11/11/2007, 07/13/2013  . Influenza,inj,Quad PF,36+ Mos 05/30/2016  . Influenza-Unspecified 07/31/2015  . MMR 04/29/2003  . Measles 07/25/1988  . Meningococcal Polysaccharide 07/08/1988  . Pneumococcal Conjugate-13 04/08/2016  .  Rubella 07/25/1988  . Td 10/15/1987, 11/02/1996, 01/23/1998  . Tdap 11/11/2007, 01/20/2013  . Typhoid Live 11/11/2007  . Typhoid Parenteral 07/25/1988, 08/23/1988  . Zoster 12/01/2014   Last Pap smear: 11/2014--normal, no high risk HPV detected Last mammogram: 06/2016 Last colonoscopy:06/2016--normal, internal hemorrhoids Last DEXA: 06/2016 T-2.5 Dentist: every 9 months Ophtho: recent Exercise: Stopped exercising for a while, but is now back into it--doing videos, using 2# weights and some walking. Vitamin D-OH 61 on 12/01/14 Lipids: Lab Results  Component Value Date   CHOL 199 12/01/2014   HDL 119 12/01/2014   LDLCALC 65 12/01/2014   TRIG 76 12/01/2014   CHOLHDL 1.7 12/01/2014   Past Medical History:  Diagnosis Date  . Allergy 2006   chemical cauterization in spring 2006, and 2nd treatment with good results.(Dr.Shoemaker)  . Bipolar disorder ([place])   . Breast cancer ([place]) 12/2014   invasive ductal carcinoma (LEFT)  . Breast cancer of upper-outer quadrant of left female breast ([place]) 12/22/2014  . Breast hematoma 12/19/2015   had left breast aspiration of 4cm hematoma-path indicates benign hematoma  . Osteoporosis 06/20/2016   T-2.5'@spine'  on Dexa ([facility])  . PONV (postoperative nausea and vomiting)     Past Surgical History:  Procedure Laterality Date  . BREAST BIOPSY Bilateral    benign and a right stereotatic breast biopsy.  . fibroid excision  age 58   prolapsed fibroid removed  . FOOT SURGERY Left 2014   Dr. Paulla Dolly  . FOOT SURGERY Right 08/2016   Dr. Paulla Dolly --Shortening Osteotomy with Fixation 1st Metatarsal   . PORT-A-CATH REMOVAL N/A 02/15/2016   Procedure:  REMOVAL PORT-A-CATH;  Surgeon: Autumn Messing III, MD;  Location: [facility];  Service: General;  Laterality: N/A;  . RADIOACTIVE SEED GUIDED MASTECTOMY WITH AXILLARY SENTINEL LYMPH NODE BIOPSY Left 05/25/2015   Procedure: LEFT BREAST LUMPECTOMY WITH RADIOACTIVE SEED AND SENTINEL LYMPH NODE BIOPSY;  Surgeon:  Autumn Messing III, MD;  Location: [facility];  Service: General;  Laterality: Left;    Social History   Social History  . Marital status: Married    Spouse name: N/A  . Number of children: N/A  . Years of education: N/A   Occupational History  . Not on file.   Social History Main Topics  . Smoking status: Never Smoker  . Smokeless tobacco: Never Used  . Alcohol use 0.0 oz/week     Comment: 1-2 glasses of wine a month  . Drug use: No  . Sexual activity: Yes    Partners: Male     Comment: postmenopausal   Other Topics Concern  . Not on file   Social History Narrative   Married [name]), 1 dog (chocolate lab). Homemaker    Family History  Problem Relation Age of Onset  . Cancer Father        skin - non melanoma - farmer  . Heart disease Father   . Hypertension Mother   . Arthritis Mother        osteoarthritis  . Colon cancer Mother   . Macular degeneration Mother        wet and dry  . Cancer Mother 46       colon cancer at age 23  . Osteoporosis Sister        osteopenia (states it resolved)  . Cancer Maternal Aunt        lung cancer  . Cancer Paternal Aunt 46       ? stomach cancer  . Autism Sister   . Mental retardation Sister   . Blindness Sister        secondary to retrolental fibroplasia  . Osteoporosis Sister   . Goiter Paternal Aunt   . Diabetes Paternal Aunt     Outpatient Encounter Prescriptions as of 04/10/2017  Medication Sig Note  . Cholecalciferol (VITAMIN D) 2000 UNITS tablet Take 2,000 Units by mouth daily.   . ferrous sulfate 325 (65 FE) MG tablet Take 325 mg by mouth every other day.  04/10/2017: Doing this due to blood donation, hopes to also donate platelets.  This one also is MVI  . fluticasone (FLONASE) 50 MCG/ACT nasal spray Place 2 sprays into both nostrils daily.   Marland Kitchen LAMICTAL 200 MG tablet Take 0.5 tablets (100 mg total) by mouth daily.   . LUTEIN-ZEAXANTHIN PO Take 1 tablet by mouth daily.   . magnesium 30 MG tablet Take 30  mg by mouth daily.   . Misc Natural Products (TART CHERRY ADVANCED) CAPS Take 1 capsule by mouth daily.   . Multiple Vitamins-Minerals (PRESERVISION AREDS PO) Take 2 capsules by mouth daily.   . naproxen sodium (ANAPROX) 220 MG tablet Take 220-440 mg by mouth daily as needed (pain). Reported on 04/08/2016   . promethazine (PHENERGAN) 25 MG tablet Take 25 mg by mouth every 6 (six) hours as needed for nausea or vomiting.   . [DISCONTINUED] cetirizine (ZYRTEC) 10 MG tablet Take 10 mg by mouth daily.    No facility-administered encounter medications on file as of 04/10/2017.     Allergies  Allergen Reactions  . Adhesive [Tape] Rash  Pt is sensitive to any kind of adhesive tape products.    ROS: The patient denies anorexia, fever, weight changes, headaches, decreased hearing, ear pain, sore throat, breast concerns, chest pain, palpitations, dizziness, syncope, dyspnea on exertion, cough, swelling, nausea, vomiting, diarrhea, constipation, abdominal pain, melena, hematochezia, indigestion/heartburn, hematuria, incontinence, dysuria, vaginal bleeding, discharge, odor or itch, genital lesions, joint pains, numbness, tingling, weakness, tremor, suspicious skin lesions, depression, anxiety, abnormal bleeding/bruising, or enlarged lymph nodes. Neuropathy from chemo has resolved.  Intermittent blurred/double vision related to dry eyes and prior chemo.  PHYSICAL EXAM:   BP 128/72 (BP Location: Right Arm, Patient Position: Sitting, Cuff Size: Normal)   Pulse 64   Ht 5' 6.5" (1.689 m)   Wt 145 lb 9.6 oz (66 kg)   BMI 23.15 kg/m   Wt Readings from Last 3 Encounters:  04/10/17 145 lb 9.6 oz (66 kg)  02/10/17 150 lb 1.6 oz (68.1 kg)  08/21/16 140 lb (63.5 kg)    General Appearance:   Alert, cooperative, no distress, appears stated age, in good spirits.  Head:   Normocephalic, without obvious abnormality, atraumatic  Eyes:   PERRL, conjunctiva/corneas clear, EOM's intact, fundi    benign  Ears:   Normal TM's and external ear canals.   Nose:  Nares normal, mucosa normal, no drainage or sinus tenderness  Throat:  Lips, mucosa, and tongue normal; teeth and gums normal  Neck:  Supple, no lymphadenopathy; thyroid: no enlargement/tenderness/nodules; no carotid bruit or JVD  Back:  Spine nontender, no curvature, ROM normal, no CVA tenderness  Lungs:   Clear to auscultation bilaterally without wheezes, rales or ronchi; respirations unlabored  Chest Wall:   No tenderness or deformity  Heart:   Regular rate and rhythm, S1 and S2 normal, no murmur, rub  or gallop  Breast Exam:   No tenderness, nipple discharge or inversion. No axillary lymphadenopathy. WHSS at left lateral breast. No masses.  Abdomen:   Soft, non-tender, nondistended, normoactive bowel sounds,   no masses, no hepatosplenomegaly  Genitalia:   Normal external genitalia without lesions, mild atrophic changes. BUS and vagina normal; no cervical motion tenderness. No abnormal vaginal discharge. Uterus and adnexa not enlarged, nontender, no masses. Pap not performed  Rectal:   Normal tone, no masses or tenderness; guaiac negative stool  Extremities:  No clubbing, cyanosis or edema  Pulses:  2+ and symmetric all extremities  Skin:  Skin color, texture, turgor normal, no rashes or lesions.   Lymph nodes:  Cervical, supraclavicular, and axillary nodes normal  Neurologic:  CNII-XII intact, normal strength, sensation and gait; reflexes 2+ and symmetric throughout  Psych: Normal mood, affect, hygiene and grooming    Chemistry      Component Value Date/Time   NA 142 02/10/2017 1210   K 4.1 02/10/2017 1210   CL 106 01/13/2015 0822   CO2 26 02/10/2017 1210   BUN 17.0 02/10/2017 1210   CREATININE 0.8 02/10/2017 1210      Component Value Date/Time   CALCIUM 9.7 02/10/2017 1210   ALKPHOS 67 02/10/2017 1210   AST 23 02/10/2017  1210   ALT 19 02/10/2017 1210   BILITOT 0.52 02/10/2017 1210     Glucose 83  Lab Results  Component Value Date   WBC 5.5 02/10/2017   HGB 13.5 02/10/2017   HCT 40.2 02/10/2017   MCV 93.1 02/10/2017   PLT 183 02/10/2017    ASSESSMENT/PLAN:  Annual physical exam - Plan: POCT Urinalysis Dipstick, TSH, Lipid panel  Malignant neoplasm of  upper-outer quadrant of left breast in female, estrogen receptor negative ([place])  Other osteoporosis without current pathological fracture - Continue treatments q6 mos, calcium vitamin D and weight bearing exercise  Bipolar disorder in full remission, most recent episode unspecified type ([place])    Discussed monthly self breast exams and yearly mammograms; at least 30 minutes of aerobic activity at least 5 days/week, weight-bearing exercise at least 2x/week; proper sunscreen use reviewed; healthy diet, including goals of calcium and vitamin D intake and alcohol recommendations (less than or equal to 1 drink/day) reviewed; regular seatbelt use; changing batteries in smoke detectors. Immunization recommendations discussed--continue yearly flu shots. Pneumovax at age 17. Shingrix recommended. Colonoscopy recommendations reviewed--UTD. Pap smear next year.  F/u 1 year for CPE

## 2017-04-10 ENCOUNTER — Ambulatory Visit (INDEPENDENT_AMBULATORY_CARE_PROVIDER_SITE_OTHER): Payer: BLUE CROSS/BLUE SHIELD | Admitting: Family Medicine

## 2017-04-10 ENCOUNTER — Encounter: Payer: Self-pay | Admitting: Family Medicine

## 2017-04-10 VITALS — BP 128/72 | HR 64 | Ht 66.5 in | Wt 145.6 lb

## 2017-04-10 DIAGNOSIS — Z0000 Encounter for general adult medical examination without abnormal findings: Secondary | ICD-10-CM | POA: Diagnosis not present

## 2017-04-10 DIAGNOSIS — F3170 Bipolar disorder, currently in remission, most recent episode unspecified: Secondary | ICD-10-CM

## 2017-04-10 DIAGNOSIS — M818 Other osteoporosis without current pathological fracture: Secondary | ICD-10-CM | POA: Diagnosis not present

## 2017-04-10 DIAGNOSIS — C50412 Malignant neoplasm of upper-outer quadrant of left female breast: Secondary | ICD-10-CM | POA: Diagnosis not present

## 2017-04-10 DIAGNOSIS — Z171 Estrogen receptor negative status [ER-]: Secondary | ICD-10-CM

## 2017-04-10 LAB — POCT URINALYSIS DIPSTICK
Bilirubin, UA: NEGATIVE
Blood, UA: NEGATIVE
Glucose, UA: NEGATIVE
Ketones, UA: NEGATIVE
LEUKOCYTES UA: NEGATIVE
Nitrite, UA: NEGATIVE
PROTEIN UA: NEGATIVE
Spec Grav, UA: 1.01 (ref 1.010–1.025)
UROBILINOGEN UA: NEGATIVE U/dL — AB
pH, UA: 7.5 (ref 5.0–8.0)

## 2017-04-10 LAB — TSH: TSH: 3.73 m[IU]/L

## 2017-04-10 NOTE — Patient Instructions (Signed)
  HEALTH MAINTENANCE RECOMMENDATIONS:  It is recommended that you get at least 30 minutes of aerobic exercise at least 5 days/week (for weight loss, you may need as much as 60-90 minutes). This can be any activity that gets your heart rate up. This can be divided in 10-15 minute intervals if needed, but try and build up your endurance at least once a week.  Weight bearing exercise is also recommended twice weekly.  Eat a healthy diet with lots of vegetables, fruits and fiber.  "Colorful" foods have a lot of vitamins (ie green vegetables, tomatoes, red peppers, etc).  Limit sweet tea, regular sodas and alcoholic beverages, all of which has a lot of calories and sugar.  Up to 1 alcoholic drink daily may be beneficial for women (unless trying to lose weight, watch sugars).  Drink a lot of water.  Calcium recommendations are 1200-1500 mg daily (1500 mg for postmenopausal women or women without ovaries), and vitamin D 1000 IU daily.  This should be obtained from diet and/or supplements (vitamins), and calcium should not be taken all at once, but in divided doses.  Monthly self breast exams and yearly mammograms for women over the age of 23 is recommended.  Sunscreen of at least SPF 30 should be used on all sun-exposed parts of the skin when outside between the hours of 10 am and 4 pm (not just when at beach or pool, but even with exercise, golf, tennis, and yard work!)  Use a sunscreen that says "broad spectrum" so it covers both UVA and UVB rays, and make sure to reapply every 1-2 hours.  Remember to change the batteries in your smoke detectors when changing your clock times in the spring and fall.  Use your seat belt every time you are in a car, and please drive safely and not be distracted with cell phones and texting while driving.  I recommend getting the new shingles vaccine (Shingrix). You will need to check with your insurance to see if it is covered.  It is a series of 2 injections, spaced 2  months apart.

## 2017-04-11 LAB — LIPID PANEL
Cholesterol: 208 mg/dL — ABNORMAL HIGH (ref <200)
HDL: 102 mg/dL (ref >50)
LDL Cholesterol: 88 mg/dL (ref <100)
TRIGLYCERIDES: 89 mg/dL (ref <150)
Total CHOL/HDL Ratio: 2 Ratio (ref <5.0)
VLDL: 18 mg/dL (ref <30)

## 2017-06-14 HISTORY — PX: MOUTH SURGERY: SHX715

## 2017-07-14 DIAGNOSIS — Z5189 Encounter for other specified aftercare: Secondary | ICD-10-CM

## 2017-07-14 HISTORY — DX: Encounter for other specified aftercare: Z51.89

## 2017-07-16 ENCOUNTER — Emergency Department (HOSPITAL_COMMUNITY): Payer: BLUE CROSS/BLUE SHIELD

## 2017-07-16 ENCOUNTER — Inpatient Hospital Stay (HOSPITAL_COMMUNITY): Payer: BLUE CROSS/BLUE SHIELD | Admitting: Anesthesiology

## 2017-07-16 ENCOUNTER — Encounter (HOSPITAL_COMMUNITY): Payer: Self-pay | Admitting: *Deleted

## 2017-07-16 ENCOUNTER — Encounter (HOSPITAL_COMMUNITY)
Admission: EM | Disposition: A | Discharge status: Home / Self Care | Payer: Self-pay | Source: Home / Self Care | Attending: Internal Medicine

## 2017-07-16 ENCOUNTER — Inpatient Hospital Stay (HOSPITAL_COMMUNITY)
Admission: EM | Admit: 2017-07-16 | Discharge: 2017-07-19 | DRG: 378 | Disposition: A | Discharge status: Home / Self Care | Payer: BLUE CROSS/BLUE SHIELD | Attending: Internal Medicine | Admitting: Internal Medicine

## 2017-07-16 DIAGNOSIS — R944 Abnormal results of kidney function studies: Secondary | ICD-10-CM | POA: Diagnosis present

## 2017-07-16 DIAGNOSIS — K922 Gastrointestinal hemorrhage, unspecified: Secondary | ICD-10-CM | POA: Diagnosis not present

## 2017-07-16 DIAGNOSIS — R0602 Shortness of breath: Secondary | ICD-10-CM

## 2017-07-16 DIAGNOSIS — D62 Acute posthemorrhagic anemia: Secondary | ICD-10-CM | POA: Diagnosis present

## 2017-07-16 DIAGNOSIS — D696 Thrombocytopenia, unspecified: Secondary | ICD-10-CM | POA: Diagnosis present

## 2017-07-16 DIAGNOSIS — Z23 Encounter for immunization: Secondary | ICD-10-CM | POA: Diagnosis not present

## 2017-07-16 DIAGNOSIS — M810 Age-related osteoporosis without current pathological fracture: Secondary | ICD-10-CM | POA: Diagnosis present

## 2017-07-16 DIAGNOSIS — I9589 Other hypotension: Secondary | ICD-10-CM

## 2017-07-16 DIAGNOSIS — D649 Anemia, unspecified: Secondary | ICD-10-CM | POA: Diagnosis not present

## 2017-07-16 DIAGNOSIS — Z853 Personal history of malignant neoplasm of breast: Secondary | ICD-10-CM | POA: Diagnosis not present

## 2017-07-16 DIAGNOSIS — E861 Hypovolemia: Secondary | ICD-10-CM

## 2017-07-16 DIAGNOSIS — F319 Bipolar disorder, unspecified: Secondary | ICD-10-CM | POA: Diagnosis present

## 2017-07-16 DIAGNOSIS — K449 Diaphragmatic hernia without obstruction or gangrene: Secondary | ICD-10-CM

## 2017-07-16 DIAGNOSIS — R000 Tachycardia, unspecified: Secondary | ICD-10-CM | POA: Diagnosis not present

## 2017-07-16 DIAGNOSIS — R195 Other fecal abnormalities: Secondary | ICD-10-CM

## 2017-07-16 DIAGNOSIS — Z91048 Other nonmedicinal substance allergy status: Secondary | ICD-10-CM

## 2017-07-16 DIAGNOSIS — K264 Chronic or unspecified duodenal ulcer with hemorrhage: Secondary | ICD-10-CM | POA: Diagnosis present

## 2017-07-16 DIAGNOSIS — Z9221 Personal history of antineoplastic chemotherapy: Secondary | ICD-10-CM

## 2017-07-16 DIAGNOSIS — I959 Hypotension, unspecified: Secondary | ICD-10-CM | POA: Diagnosis present

## 2017-07-16 DIAGNOSIS — Z79899 Other long term (current) drug therapy: Secondary | ICD-10-CM

## 2017-07-16 DIAGNOSIS — K648 Other hemorrhoids: Secondary | ICD-10-CM | POA: Diagnosis present

## 2017-07-16 DIAGNOSIS — K921 Melena: Secondary | ICD-10-CM

## 2017-07-16 HISTORY — DX: Anemia, unspecified: D64.9

## 2017-07-16 HISTORY — PX: ESOPHAGOGASTRODUODENOSCOPY: SHX5428

## 2017-07-16 LAB — I-STAT TROPONIN, ED: Troponin i, poc: 0 ng/mL (ref 0.00–0.08)

## 2017-07-16 LAB — HEPATIC FUNCTION PANEL
ALBUMIN: 3.2 g/dL — AB (ref 3.5–5.0)
ALK PHOS: 37 U/L — AB (ref 38–126)
ALT: 15 U/L (ref 14–54)
AST: 20 U/L (ref 15–41)
Bilirubin, Direct: <0.1 mg/dL — ABNORMAL LOW (ref 0.1–0.5)
TOTAL PROTEIN: 5.1 g/dL — AB (ref 6.5–8.1)
Total Bilirubin: 0.6 mg/dL (ref 0.3–1.2)

## 2017-07-16 LAB — HEMOGLOBIN AND HEMATOCRIT, BLOOD
HEMATOCRIT: 22 % — AB (ref 36.0–46.0)
Hemoglobin: 7.5 g/dL — ABNORMAL LOW (ref 12.0–15.0)

## 2017-07-16 LAB — BASIC METABOLIC PANEL
ANION GAP: 11 (ref 5–15)
BUN: 57 mg/dL — ABNORMAL HIGH (ref 6–20)
CHLORIDE: 104 mmol/L (ref 101–111)
CO2: 21 mmol/L — AB (ref 22–32)
Calcium: 8.7 mg/dL — ABNORMAL LOW (ref 8.9–10.3)
Creatinine, Ser: 0.8 mg/dL (ref 0.44–1.00)
GFR calc non Af Amer: >60 mL/min (ref >60)
Glucose, Bld: 173 mg/dL — ABNORMAL HIGH (ref 65–99)
POTASSIUM: 3.5 mmol/L (ref 3.5–5.1)
Sodium: 136 mmol/L (ref 135–145)

## 2017-07-16 LAB — CBC
HCT: 22.1 % — ABNORMAL LOW (ref 36.0–46.0)
HEMOGLOBIN: 7.4 g/dL — AB (ref 12.0–15.0)
MCH: 31 pg (ref 26.0–34.0)
MCHC: 33.5 g/dL (ref 30.0–36.0)
MCV: 92.5 fL (ref 78.0–100.0)
Platelets: 227 10*3/uL (ref 150–400)
RBC: 2.39 MIL/uL — AB (ref 3.87–5.11)
RDW: 13.4 % (ref 11.5–15.5)
WBC: 7.2 10*3/uL (ref 4.0–10.5)

## 2017-07-16 LAB — ABO/RH: ABO/RH(D): O POS

## 2017-07-16 LAB — POC OCCULT BLOOD, ED: FECAL OCCULT BLD: POSITIVE — AB

## 2017-07-16 LAB — LIPASE, BLOOD: Lipase: 29 U/L (ref 11–51)

## 2017-07-16 LAB — PREPARE RBC (CROSSMATCH)

## 2017-07-16 SURGERY — EGD (ESOPHAGOGASTRODUODENOSCOPY)
Anesthesia: Monitor Anesthesia Care

## 2017-07-16 MED ORDER — SODIUM CHLORIDE 0.9 % IV BOLUS (SEPSIS)
1000.0000 mL | Freq: Once | INTRAVENOUS | Status: AC
  Administered 2017-07-16: 1000 mL via INTRAVENOUS

## 2017-07-16 MED ORDER — ONDANSETRON HCL 4 MG PO TABS: 4.0000 mg | ORAL_TABLET | Freq: Four times a day (QID) | ORAL | Status: DC | PRN

## 2017-07-16 MED ORDER — LIDOCAINE 2% (20 MG/ML) 5 ML SYRINGE
INTRAMUSCULAR | Status: DC | PRN
  Administered 2017-07-16: 100 mg via INTRAVENOUS

## 2017-07-16 MED ORDER — ONDANSETRON HCL 4 MG/2ML IJ SOLN: 4.0000 mg | Freq: Four times a day (QID) | INTRAMUSCULAR | Status: DC | PRN

## 2017-07-16 MED ORDER — INFLUENZA VAC SPLIT QUAD 0.5 ML IM SUSY
0.5000 mL | PREFILLED_SYRINGE | INTRAMUSCULAR | Status: AC
  Administered 2017-07-17: 0.5 mL via INTRAMUSCULAR

## 2017-07-16 MED ORDER — ONDANSETRON HCL 4 MG/2ML IJ SOLN
INTRAMUSCULAR | Status: DC | PRN
  Administered 2017-07-16: 4 mg via INTRAVENOUS

## 2017-07-16 MED ORDER — SODIUM CHLORIDE 0.9 % IV SOLN
10.0000 mL/h | Freq: Once | INTRAVENOUS | Status: AC
  Administered 2017-07-16: 10 mL/h via INTRAVENOUS

## 2017-07-16 MED ORDER — PROPOFOL 10 MG/ML IV BOLUS
INTRAVENOUS | Status: DC | PRN
  Administered 2017-07-16: 30 mg via INTRAVENOUS
  Administered 2017-07-16 (×2): 40 mg via INTRAVENOUS
  Administered 2017-07-16: 30 mg via INTRAVENOUS

## 2017-07-16 MED ORDER — PANTOPRAZOLE SODIUM 40 MG IV SOLR
40.0000 mg | Freq: Once | INTRAVENOUS | Status: AC
  Administered 2017-07-16: 40 mg via INTRAVENOUS
  Filled 2017-07-16: qty 40

## 2017-07-16 MED ORDER — EPINEPHRINE PF 1 MG/10ML IJ SOSY
PREFILLED_SYRINGE | INTRAMUSCULAR | Status: AC
  Filled 2017-07-16: qty 10

## 2017-07-16 MED ORDER — LAMOTRIGINE 100 MG PO TABS
100.0000 mg | ORAL_TABLET | Freq: Every day | ORAL | Status: DC
  Filled 2017-07-16 (×4): qty 1

## 2017-07-16 MED ORDER — SODIUM CHLORIDE 0.9 % IV SOLN
8.0000 mg/h | INTRAVENOUS | Status: DC
  Administered 2017-07-16 – 2017-07-18 (×5): 8 mg/h via INTRAVENOUS
  Filled 2017-07-16 (×13): qty 80

## 2017-07-16 MED ORDER — PROPOFOL 500 MG/50ML IV EMUL
INTRAVENOUS | Status: DC | PRN
  Administered 2017-07-16: 100 ug/kg/min via INTRAVENOUS

## 2017-07-16 MED ORDER — SODIUM CHLORIDE 0.9 % IV SOLN
Freq: Once | INTRAVENOUS | Status: AC
  Administered 2017-07-16: 21:00:00 via INTRAVENOUS

## 2017-07-16 MED ORDER — LACTATED RINGERS IV SOLN
INTRAVENOUS | Status: DC | PRN
  Administered 2017-07-16: 11:00:00 via INTRAVENOUS

## 2017-07-16 MED ORDER — TRAMADOL HCL 50 MG PO TABS
50.0000 mg | ORAL_TABLET | Freq: Four times a day (QID) | ORAL | Status: DC | PRN
  Administered 2017-07-17: 50 mg via ORAL
  Filled 2017-07-16: qty 1

## 2017-07-16 MED ORDER — SODIUM CHLORIDE 0.9 % IJ SOLN
PREFILLED_SYRINGE | INTRAMUSCULAR | Status: DC | PRN
  Administered 2017-07-16: 3 mL

## 2017-07-16 NOTE — Consult Note (Signed)
[facility] Consult: 8:19 AM 07/16/2017  LOS: 0 days    Referring Provider: Dr Marily Memos  Primary Care Physician:  Rita Ohara, MD Primary Gastroenterologist:  Chucky May, MD in [facility]     Reason for Consultation:  Symptomatic anemia.   HPI: Rhonda Gardner is a 61 y.o. female.  Past hx left breast cancer. S/p lumpectomy, SN biopsy 05/2015. Underwent chemo.  Porta cath removed 02/2016.    06/2016 Colonoscopy for hx cancer in mother.  Found only internal hemorrhoids (as was case in 2012).    One week ago, the patient had oral surgery to address an infected cyst in the root of her upper right molar. For a few days she took both acetaminophen and Aleve 1 every 8 hours. After the few days she stopped this but then over the weekend she started taking it again because of sinus pressure and headache. Normally she doesn't take any regular NSAIDs. After the oral surgery she was prescribed clindamycin and finished this last night. Since the oral surgery she's been feeling of malaise, fatigue. It came to a head yesterday when she was so fatigued and exhausted she felt she could hardly stand. She wasn't dizzy. She did notice tachycardia with minimal exertion. Felt short of breath and had a little bit of pressure in her chest which made her feel panicky. She came to the [facility] ED.   Hgb 7.4, was 13.5 in 01/2017.  MCV 92.  Elevated BUN 57.  Troponin normal.  She is about to receive 1 unit of PRBCs.  A single dose of IV Protonix has been ordered but not administered.    Patient hasn't had any GI symptoms. Her appetite is stable. No nausea. No abdominal pain. Has never needed to use acid suppressing meds. Stools are brown.   Family history significant for her mother being a colon cancer survivor. By the way her mother is  22 years old. As a young woman her mother had ulcers. Her father in his middle age had perforated ulcer. Patient doesn't smoke cigarettes. She rarely drinks any alcoholic beverages.   Past Medical History:  Diagnosis Date  . Allergy 2006   chemical cauterization in spring 2006, and 2nd treatment with good results.(Dr.Shoemaker)  . Bipolar disorder ([place])   . Breast cancer ([place]) 12/2014   invasive ductal carcinoma (LEFT)  . Breast cancer of upper-outer quadrant of left female breast ([place]) 12/22/2014  . Breast hematoma 12/19/2015   had left breast aspiration of 4cm hematoma-path indicates benign hematoma  . Osteoporosis 06/20/2016   T-2.5@spine  on Dexa ([facility])  . PONV (postoperative nausea and vomiting)     Past Surgical History:  Procedure Laterality Date  . BREAST BIOPSY Bilateral    benign and a right stereotatic breast biopsy.  . fibroid excision  age 48   prolapsed fibroid removed  . FOOT SURGERY Left 2014   Dr. Paulla Dolly  . FOOT SURGERY Right 08/2016   Dr. Paulla Dolly --Shortening Osteotomy with Fixation 1st Metatarsal   . PORT-A-CATH REMOVAL N/A 02/15/2016   Procedure: REMOVAL PORT-A-CATH;  Surgeon:  Autumn Messing III, MD;  Location: [facility];  Service: General;  Laterality: N/A;  . RADIOACTIVE SEED GUIDED MASTECTOMY WITH AXILLARY SENTINEL LYMPH NODE BIOPSY Left 05/25/2015   Procedure: LEFT BREAST LUMPECTOMY WITH RADIOACTIVE SEED AND SENTINEL LYMPH NODE BIOPSY;  Surgeon: Autumn Messing III, MD;  Location: [facility];  Service: General;  Laterality: Left;    Prior to Admission medications   Medication Sig Start Date End Date Taking? Authorizing Provider  Cholecalciferol (VITAMIN D) 2000 UNITS tablet Take 2,000 Units by mouth daily.   Yes [provider]  fluticasone (FLONASE) 50 MCG/ACT nasal spray Place 2 sprays into both nostrils daily.   Yes [provider]  LAMICTAL 200 MG tablet Take 0.5 tablets (100 mg total) by mouth daily. 02/24/17  Yes  Rita Ohara, MD  LUTEIN-ZEAXANTHIN PO Take 1 tablet by mouth daily.   Yes [provider]  magnesium 30 MG tablet Take 30 mg by mouth daily.   Yes [provider]  Multiple Vitamins-Minerals (PRESERVISION AREDS PO) Take 2 capsules by mouth daily.   Yes [provider]  naproxen sodium (ANAPROX) 220 MG tablet Take 220-440 mg by mouth daily as needed (pain). Reported on 04/08/2016   Yes [provider]  phenylephrine (SUDAFED PE) 10 MG TABS tablet Take 10-30 mg by mouth every 4 (four) hours as needed (congestion).   Yes [provider]  promethazine (PHENERGAN) 25 MG tablet Take 25 mg by mouth every 6 (six) hours as needed for nausea or vomiting.   Yes Wallene Huh, DPM  saccharomyces boulardii (FLORASTOR) 250 MG capsule Take 250 mg by mouth 2 (two) times daily.   Yes [provider]    Scheduled Meds: . pantoprazole (PROTONIX) IV  40 mg Intravenous Once   Infusions: . sodium chloride     PRN Meds:    Allergies as of 07/16/2017 - Review Complete 07/16/2017  Allergen Reaction Noted  . Adhesive [tape] Rash 01/25/2015    Family History  Problem Relation Age of Onset  . Cancer Father        skin - non melanoma - farmer  . Heart disease Father   . Hypertension Mother   . Arthritis Mother        osteoarthritis  . Colon cancer Mother   . Macular degeneration Mother        wet and dry  . Cancer Mother 41       colon cancer at age 79  . Osteoporosis Sister        osteopenia (states it resolved)  . Cancer Maternal Aunt        lung cancer  . Cancer Paternal Aunt 62       ? stomach cancer  . Autism Sister   . Mental retardation Sister   . Blindness Sister        secondary to retrolental fibroplasia  . Osteoporosis Sister   . Goiter Paternal Aunt   . Diabetes Paternal Aunt     Social History   Social History  . Marital status: Married    Spouse name: N/A  . Number of children: N/A  . Years of education: N/A    Occupational History  . Not on file.   Social History Main Topics  . Smoking status: Never Smoker  . Smokeless tobacco: Never Used  . Alcohol use 0.0 oz/week     Comment: 1-2 glasses of wine a month  . Drug use: No  . Sexual activity: Yes  Partners: Male     Comment: postmenopausal   Other Topics Concern  . Not on file   Social History Narrative   Married Rhonda Gardner), 1 dog (chocolate lab). Homemaker    REVIEW OF SYSTEMS: Constitutional:  Weakness, malaise, fatigue. ENT:  No nose bleeds Pulm:  DOE CV:  No palpitations, no LE edema.  GU:  No hematuria, no frequency GI:  No issues with heartburn, dysphagia, nausea, vomiting, anorexia, abdominal pain. Heme:  No unusual or excessive bleeding or bruising.   Transfusions:  None Neuro:  Had some sinus pressure headaches over the weekend. Derm:  No itching, no rash or sores.  Endocrine:  No sweats or chills.  No polyuria or dysuria Immunization:  Not queried Travel:  None beyond local counties in last few months.    PHYSICAL EXAM: Vital signs in last 24 hours: Vitals:   07/16/17 0700 07/16/17 0715  BP: (!) 98/49 103/64  Pulse: 86 91  Resp: 19 16  Temp:    SpO2: 100% 100%   Wt Readings from Last 3 Encounters:  07/16/17 63.5 kg (140 lb)  04/10/17 66 kg (145 lb 9.6 oz)  02/10/17 68.1 kg (150 lb 1.6 oz)    General: Pale but otherwise well appearing WF. She is comfortable and fully alert Head:  No facial asymmetry or swelling. No signs of head trauma.  Eyes:  No scleral icterus, no conjunctival pallor. EOMI. Ears:  No hearing deficit  Nose:  No congestion or discharge Mouth:  Slight, hardly noticeable, swelling and bruising on the right cheek. Oropharynx is moist and clear. There is no blood or visible gum lesion. Neck:  No JVD, no masses, no thyromegaly. Lungs:  Clear bilaterally. No dyspnea or cough. Heart: RRR. No MRG. S1, S2 present. Abdomen:  Nonobese. Nontender. No masses, hernias, bruits, HSM. Bowel sounds  active..   Rectal: Did not repeat rectal exam. Per ED staff stool brown and FOBT positive.   Musc/Skeltl: No musculoskeletal deformities. No joint redness. Extremities:  No CCE.  Neurologic:  Alert. Oriented times 3. Good historian. No tremor, no limb weakness. Skin:  No rash, no sores, no telangiectasia. Tattoos:  None visualized. Nodes:  No cervical or inguinal adenopathy.   Psych:  Calm. Pleasant. Intelligent. Speech fluid.  Intake/Output from previous day: No intake/output data recorded. Intake/Output this shift: No intake/output data recorded.  LAB RESULTS:  Recent Labs  07/16/17 0450  WBC 7.2  HGB 7.4*  HCT 22.1*  PLT 227   BMET Lab Results  Component Value Date   NA 136 07/16/2017   NA 142 02/10/2017   NA 145 07/22/2016   K 3.5 07/16/2017   K 4.1 02/10/2017   K 4.1 07/22/2016   CL 104 07/16/2017   CL 106 01/13/2015   CL 101 12/01/2014   CO2 21 (L) 07/16/2017   CO2 26 02/10/2017   CO2 27 07/22/2016   GLUCOSE 173 (H) 07/16/2017   GLUCOSE 83 02/10/2017   GLUCOSE 82 07/22/2016   BUN 57 (H) 07/16/2017   BUN 17.0 02/10/2017   BUN 14.5 07/22/2016   CREATININE 0.80 07/16/2017   CREATININE 0.8 02/10/2017   CREATININE 0.8 07/22/2016   CALCIUM 8.7 (L) 07/16/2017   CALCIUM 9.7 02/10/2017   CALCIUM 9.0 07/22/2016   LFT  Recent Labs  07/16/17 0450  PROT 5.1*  ALBUMIN 3.2*  AST 20  ALT 15  ALKPHOS 37*  BILITOT 0.6  BILIDIR <0.1*  IBILI NOT CALCULATED   PT/INR Lab Results  Component Value Date  INR 0.95 01/13/2015   Lipase     Component Value Date/Time   LIPASE 29 07/16/2017 0450    RADIOLOGY STUDIES: Dg Chest 2 View  Result Date: 07/16/2017 CLINICAL DATA:  Weakness, shortness of breath, tightness in the chest for 3 days. EXAM: CHEST  2 VIEW COMPARISON:  None. FINDINGS: Mild hyperinflation. Normal heart size and pulmonary vascularity. No focal airspace disease or consolidation in the lungs. No blunting of costophrenic angles. No pneumothorax.  Mediastinal contours appear intact. Surgical clips in the left axilla. IMPRESSION: No active cardiopulmonary disease. Electronically Signed   By: Lucienne Capers M.D.   On: 07/16/2017 05:42     IMPRESSION:   *  Normocytic anemia, symptomatic.  One unit PRBCs infusing currently. FOBT positive but no black or melenic stool. Rule out ulcer disease with recent use of Aleve. Although she had oral surgery there's not been any oral bleeding associated with this procedure performed last week.  *  Maternal hx colon cancer.  Pt has not had colon polyps on colonoscopies 2007, 2012, 2017  *  Recent oral surgery 1 week ago.  No associated oral bleeding.  Infected oral cyst.  Finished course of Clindamycin 10/2 in PM.      PLAN:     *  Patient has been nothing by mouth and planning EGD for later this morning versus early afternoon. Patient aware. Risks, benefits discussed with patient.   Azucena Freed  07/16/2017, 8:19 AM Pager: [phone]     Attending physician's note   I have taken a history, examined the patient and reviewed the chart. I agree with the Advanced Practitioner's note, impression and recommendations. Normocytic anemia and FOBT positive stool. R/O ulcer, gastritis, etc. Transfuse to keep Hb > 7-8. IV PPI and avoid NSAIDs. EGD today. Outpatient GI follow with Dr. Chucky May.   Lucio Edward, MD [facility] [phone] Mon-Fri 8a-5p [phone] after 5p, weekends, holidays

## 2017-07-16 NOTE — ED Provider Notes (Signed)
[facility] DEPT Provider Note   CSN: [id number] Arrival date & time: 07/16/17  7915     History   Chief Complaint Chief Complaint  Patient presents with  . Chest Pain    HPI Rhonda Gardner is a 61 y.o. female.  HPI  This is a 61 year old female with a history of breast cancer, bipolar disorder who presents with shortness of breath and chest pain. Patient reports 3-4 day history of worsening exertional shortness of breath, chest pain, generalized fatigue. Patient states she had dental surgery one week ago. She has since been on antibiotics and taking naproxen for pain. The surgery was done under local anesthetic. No significant blood loss that she knew. Patient denies any history of coronary artery disease. At baseline she reports low blood pressure. She has recently been taking Sudafed for sinus pressure and headaches. She reports chest tightness and shortness of breath mostly with exertion. She has to sit down. She denies any significant dizziness.she denies any abdominal pain. Occasional reflux symptoms. She did have one episode of emesis that was dark. Denies any bloody stools or dark tarry stools. Colonoscopy 1 year ago was reportedly normal.  Past Medical History:  Diagnosis Date  . Allergy 2006   chemical cauterization in spring 2006, and 2nd treatment with good results.(Dr.Shoemaker)  . Bipolar disorder ([place])   . Breast cancer ([place]) 12/2014   invasive ductal carcinoma (LEFT)  . Breast cancer of upper-outer quadrant of left female breast ([place]) 12/22/2014  . Breast hematoma 12/19/2015   had left breast aspiration of 4cm hematoma-path indicates benign hematoma  . Osteoporosis 06/20/2016   T-2.5@spine  on Dexa ([place])  . PONV (postoperative nausea and vomiting)     Patient Active Problem List   Diagnosis Date Noted  . Symptomatic anemia 07/16/2017  . Osteoporosis 07/29/2016  . Genetic counseling and testing 01/29/2016  . Pain in joint, pelvic region and thigh  06/12/2015  . 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 Facial flushing 01/25/2015  . Insomnia 01/25/2015  . Allergy to adhesive tape 01/25/2015  . Malignant neoplasm of upper-outer quadrant of left breast in female, estrogen receptor negative ([place]) 12/22/2014    Past Surgical History:  Procedure Laterality Date  . BREAST BIOPSY Bilateral    benign and a right stereotatic breast biopsy.  . fibroid excision  age 88   prolapsed fibroid removed  . FOOT SURGERY Left 2014   Dr. Paulla Dolly  . FOOT SURGERY Right 08/2016   Dr. Paulla Dolly --Shortening Osteotomy with Fixation 1st Metatarsal   . PORT-A-CATH REMOVAL N/A 02/15/2016   Procedure: REMOVAL PORT-A-CATH;  Surgeon: Autumn Messing III, MD;  Location: [facility];  Service: General;  Laterality: N/A;  . RADIOACTIVE SEED GUIDED MASTECTOMY WITH AXILLARY SENTINEL LYMPH NODE BIOPSY Left 05/25/2015   Procedure: LEFT BREAST LUMPECTOMY WITH RADIOACTIVE SEED AND SENTINEL LYMPH NODE BIOPSY;  Surgeon: Autumn Messing III, MD;  Location: [facility];  Service: General;  Laterality: Left;    OB History    Gravida Para Term Preterm AB Living   1       1     SAB TAB Ectopic Multiple Live Births     1             Home Medications    Prior to Admission medications   Medication Sig Start Date End Date Taking? Authorizing Provider  Cholecalciferol (VITAMIN D) 2000 UNITS tablet Take 2,000 Units by mouth daily.   Yes [provider]  fluticasone (FLONASE) 50  MCG/ACT nasal spray Place 2 sprays into both nostrils daily.   Yes [provider]  LAMICTAL 200 MG tablet Take 0.5 tablets (100 mg total) by mouth daily. 02/24/17  Yes Rita Ohara, MD  LUTEIN-ZEAXANTHIN PO Take 1 tablet by mouth daily.   Yes [provider]  magnesium 30 MG tablet Take 30 mg by mouth daily.   Yes [provider]  Multiple Vitamins-Minerals (PRESERVISION AREDS PO) Take 2 capsules by mouth daily.   Yes [provider]    naproxen sodium (ANAPROX) 220 MG tablet Take 220-440 mg by mouth daily as needed (pain). Reported on 04/08/2016   Yes [provider]  phenylephrine (SUDAFED PE) 10 MG TABS tablet Take 10-30 mg by mouth every 4 (four) hours as needed (congestion).   Yes [provider]  promethazine (PHENERGAN) 25 MG tablet Take 25 mg by mouth every 6 (six) hours as needed for nausea or vomiting.   Yes Wallene Huh, DPM  saccharomyces boulardii (FLORASTOR) 250 MG capsule Take 250 mg by mouth 2 (two) times daily.   Yes [provider]    Family History Family History  Problem Relation Age of Onset  . Cancer Father        skin - non melanoma - farmer  . Heart disease Father   . Hypertension Mother   . Arthritis Mother        osteoarthritis  . Colon cancer Mother   . Macular degeneration Mother        wet and dry  . Cancer Mother 36       colon cancer at age 85  . Osteoporosis Sister        osteopenia (states it resolved)  . Cancer Maternal Aunt        lung cancer  . Cancer Paternal Aunt 38       ? stomach cancer  . Autism Sister   . Mental retardation Sister   . Blindness Sister        secondary to retrolental fibroplasia  . Osteoporosis Sister   . Goiter Paternal Aunt   . Diabetes Paternal Aunt     Social History Social History  Substance Use Topics  . Smoking status: Never Smoker  . Smokeless tobacco: Never Used  . Alcohol use 0.0 oz/week     Comment: 1-2 glasses of wine a month     Allergies   Adhesive [tape]   Review of Systems Review of Systems  Constitutional: Positive for fatigue. Negative for fever.  Respiratory: Positive for shortness of breath.   Cardiovascular: Positive for chest pain.  Gastrointestinal: Positive for vomiting. Negative for abdominal pain, blood in stool and nausea.  Genitourinary: Negative for dysuria.  Neurological: Negative for dizziness.  All other systems reviewed and are negative.    Physical Exam Updated Vital  Signs BP 103/64   Pulse 91   Temp 97.6 F (36.4 C) (Oral)   Resp 16   Ht 5\' 7"  (1.702 m)   Wt 63.5 kg (140 lb)   SpO2 100%   BMI 21.93 kg/m   Physical Exam  Constitutional: She is oriented to person, place, and time. No distress.  Pale, no acute distress  HENT:  Head: Normocephalic and atraumatic.  Cardiovascular: Regular rhythm and normal heart sounds.   No murmur heard. tachycardia  Pulmonary/Chest: Effort normal and breath sounds normal. No respiratory distress. She has no wheezes.  Abdominal: Soft. Bowel sounds are normal. There is no tenderness.  Genitourinary: Rectal exam shows  guaiac positive stool.  Genitourinary Comments: Dark stool  Neurological: She is alert and oriented to person, place, and time.  Skin: Skin is warm and dry.  Psychiatric: She has a normal mood and affect.  Nursing note and vitals reviewed.    ED Treatments / Results  Labs (all labs ordered are listed, but only abnormal results are displayed) Labs Reviewed  BASIC METABOLIC PANEL - Abnormal; Notable for the following:       Result Value   CO2 21 (*)    Glucose, Bld 173 (*)    BUN 57 (*)    Calcium 8.7 (*)    All other components within normal limits  CBC - Abnormal; Notable for the following:    RBC 2.39 (*)    Hemoglobin 7.4 (*)    HCT 22.1 (*)    All other components within normal limits  HEPATIC FUNCTION PANEL - Abnormal; Notable for the following:    Total Protein 5.1 (*)    Albumin 3.2 (*)    Alkaline Phosphatase 37 (*)    Bilirubin, Direct <0.1 (*)    All other components within normal limits  POC OCCULT BLOOD, ED - Abnormal; Notable for the following:    Fecal Occult Bld POSITIVE (*)    All other components within normal limits  LIPASE, BLOOD  I-STAT TROPONIN, ED  TYPE AND SCREEN  ABO/RH  PREPARE RBC (CROSSMATCH)    EKG  EKG Interpretation  Date/Time:  Wednesday July 16 2017 04:44:18 EDT Ventricular Rate:  128 PR Interval:  144 QRS Duration: 70 QT  Interval:  308 QTC Calculation: 449 R Axis:   70 Text Interpretation:  Sinus tachycardia Possible Left atrial enlargement Nonspecific ST abnormality Abnormal ECG Confirmed by Thayer Jew [phone]) on 07/16/2017 5:48:15 AM Also confirmed by Thayer Jew [phone]), editor Hattie Perch ([id number])  on 07/16/2017 7:07:10 AM       Radiology Dg Chest 2 View  Result Date: 07/16/2017 CLINICAL DATA:  Weakness, shortness of breath, tightness in the chest for 3 days. EXAM: CHEST  2 VIEW COMPARISON:  None. FINDINGS: Mild hyperinflation. Normal heart size and pulmonary vascularity. No focal airspace disease or consolidation in the lungs. No blunting of costophrenic angles. No pneumothorax. Mediastinal contours appear intact. Surgical clips in the left axilla. IMPRESSION: No active cardiopulmonary disease. Electronically Signed   By: Lucienne Capers M.D.   On: 07/16/2017 05:42    Procedures Procedures (including critical care time)  CRITICAL CARE Performed by: Merryl Hacker   Total critical care time: 30 minutes  Critical care time was exclusive of separately billable procedures and treating other patients.  Critical care was necessary to treat or prevent imminent or life-threatening deterioration.  Critical care was time spent personally by me on the following activities: development of treatment plan with patient and/or surrogate as well as nursing, discussions with consultants, evaluation of patient's response to treatment, examination of patient, obtaining history from patient or surrogate, ordering and performing treatments and interventions, ordering and review of laboratory studies, ordering and review of radiographic studies, pulse oximetry and re-evaluation of patient's condition.  Medications Ordered in ED Medications  pantoprazole (PROTONIX) injection 40 mg (not administered)  0.9 %  sodium chloride infusion (not administered)  sodium chloride 0.9 % bolus 1,000 mL (1,000 mLs  Intravenous New Bag/Given 07/16/17 0614)     Initial Impression / Assessment and Plan / ED Course  I have reviewed the triage vital signs and the nursing notes.  Pertinent labs & imaging results  that were available during my care of the patient were reviewed by me and considered in my medical decision making (see chart for details).     Patient presents with shortness of breath and chest pain. Found to be profoundly anemic when compared to prior.  Reports normal colonoscopy 1 year ago. She is Hemoccult positive. Does report dark emesis. She's currently on naproxen.   Patient given fluids. She was typed and screened and consented for 1 unit of blood given her symptomatology. Will consult gastroenterology.Suspect they will want to scope the patient. Discussed with Dr. Barbaraann Faster who will evaluate the patient. She is currently stable.  [facility] on call. I have not received a phone call yet. Per Dr. Barbaraann Faster, he will touch base with them later this morning.  Final Clinical Impressions(s) / ED Diagnoses   Final diagnoses:  Symptomatic anemia  SOB (shortness of breath)    New Prescriptions New Prescriptions   No medications on file     Merryl Hacker, MD 07/16/17 [phone]

## 2017-07-16 NOTE — Progress Notes (Signed)
Arrived to unit at approx 10:30am from ED with husband present at bedside. Telephone report received prior to arrival. Oriented to room and unit. PRBC infusing via RAC without difficulty upon initial encounter. Assessment completed as documented. Pt reports "feeling much better," denying any N/V, pain or discomfort at this time. Within 15-20 mins of arrival to unit, pt transported to endoscopy by endo RN for scheduled EGD.

## 2017-07-16 NOTE — Op Note (Addendum)
[facility] Patient Name: Rhonda Gardner Procedure Date : 07/16/2017 MRN: [id number] Attending MD: Ladene Artist , MD Date of Birth: 07-21-1956 CSN: [id number] Age: 61 Admit Type: Inpatient Procedure:                Upper GI endoscopy Indications:              Heme positive stool, Melena Providers:                Pricilla Riffle. Fuller Plan, MD, Burtis Junes, RN, William Dalton, Technician Referring MD:             [facility] Medicines:                Monitored Anesthesia Care Complications:            No immediate complications. Estimated Blood Loss:     Estimated blood loss: none. Procedure:                Pre-Anesthesia Assessment:                           - Prior to the procedure, a History and Physical                            was performed, and patient medications and                            allergies were reviewed. [name] tolerance of                            previous anesthesia was also reviewed. The risks                            and benefits of the procedure and the sedation                            options and risks were discussed with the patient.                            All questions were answered, and informed consent                            was obtained. Prior Anticoagulants: The patient has                            taken no previous anticoagulant or antiplatelet                            agents. ASA Grade Assessment: II - A patient with                            mild systemic disease. After reviewing the risks  and benefits, the patient was deemed in                            satisfactory condition to undergo the procedure.                           After obtaining informed consent, the endoscope was                            passed under direct vision. Throughout the                            procedure, the patient's blood pressure, pulse, and      oxygen saturations were monitored continuously. The                            EG-[id number] ([id number]) scope was introduced through the                            mouth, and advanced to the second part of duodenum.                            The upper GI endoscopy was accomplished without                            difficulty. The patient tolerated the procedure                            well. Scope In: Scope Out: Findings:      The examined esophagus was normal.      A small hiatal hernia was present.      The exam of the stomach was otherwise normal.      One oozing superficial duodenal ulcer with a visible vessel was found in       the second portion of the duodenum and adjancent to the papilla. The       lesion was 7 mm in largest dimension. Area was unsuccessfully injected       with 3 mL of a 1:10,000 solution of epinephrine for hemostasis.       Coagulation for hemostasis using Bicap was successful.      The duodenal bulb was normal. Impression:               - Normal esophagus.                           - Small hiatal hernia.                           - One oozing duodenal ulcer with a visible vessel.                            Treatment with epinephrine not successful.                            Treatment with Bicap was successful.                           -  No specimens collected. Moderate Sedation:      none/MAC Recommendation:           - Return patient to hospital ward for ongoing care.                           - Clear liquid diet today.                           - No aspirin, ibuprofen, naproxen, or other                            non-steroidal anti-inflammatory drugs long term.                           - Check H pylori Ab and treat if positive                           - IV PPI infusion Procedure Code(s):        --- Professional ---                           [phone], Esophagogastroduodenoscopy, flexible,                            transoral; with control of bleeding,  any method Diagnosis Code(s):        --- Professional ---                           K44.9, Diaphragmatic hernia without obstruction or                            gangrene                           K26.4, Chronic or unspecified duodenal ulcer with                            hemorrhage                           R19.5, Other fecal abnormalities                           K92.1, Melena (includes Hematochezia) CPT copyright 2016 American Medical Association. All rights reserved. The codes documented in this report are preliminary and upon coder review may  be revised to meet current compliance requirements. Ladene Artist, MD 07/16/2017 11:59:53 AM This report has been signed electronically. Number of Addenda: 0

## 2017-07-16 NOTE — ED Triage Notes (Signed)
C/o generalized chest pain onset yest patient is restless at triage, states she had some dental surgery done last week and has been on antibiotics.. Color is very pale

## 2017-07-16 NOTE — Transfer of Care (Signed)
Immediate Anesthesia Transfer of Care Note  Patient: Rhonda Gardner  Procedure(s) Performed: ESOPHAGOGASTRODUODENOSCOPY (EGD) (N/A )  Patient Location: PACU and Endoscopy Unit  Anesthesia Type:MAC  Level of Consciousness: awake, alert  and oriented  Airway & Oxygen Therapy: Patient Spontanous Breathing  Post-op Assessment: Report given to RN and Post -op Vital signs reviewed and stable  Post vital signs: Reviewed and stable  Last Vitals:  Vitals:   07/16/17 0900 07/16/17 1103  BP: (!) 110/59 (!) 114/33  Pulse: 85 86  Resp: (!) 21 12  Temp: 36.8 C 36.9 C  SpO2: 100% 100%    Last Pain:  Vitals:   07/16/17 1103  TempSrc: Oral  PainSc:          Complications: No apparent anesthesia complications

## 2017-07-16 NOTE — Anesthesia Preprocedure Evaluation (Signed)
Anesthesia Evaluation  Patient identified by MRN, date of birth, ID band Patient awake    Reviewed: Allergy & Precautions, H&P , NPO status , Patient's Chart, lab work & pertinent test results  History of Anesthesia Complications (+) PONV and history of anesthetic complications  Airway Mallampati: II   Neck ROM: full    Dental   Pulmonary    breath sounds clear to auscultation       Cardiovascular negative cardio ROS   Rhythm:regular Rate:Normal     Neuro/Psych PSYCHIATRIC DISORDERS Bipolar Disorder  Neuromuscular disease    GI/Hepatic Heme positive stool   Endo/Other    Renal/GU      Musculoskeletal   Abdominal   Peds  Hematology  (+) anemia ,   Anesthesia Other Findings   Reproductive/Obstetrics Breast CA                             Anesthesia Physical Anesthesia Plan  ASA: II  Anesthesia Plan: MAC   Post-op Pain Management:    Induction: Intravenous  PONV Risk Score and Plan: 3 and Ondansetron, Dexamethasone, Midazolam and Propofol infusion  Airway Management Planned: Simple Face Mask  Additional Equipment:   Intra-op Plan:   Post-operative Plan:   Informed Consent: I have reviewed the patients History and Physical, chart, labs and discussed the procedure including the risks, benefits and alternatives for the proposed anesthesia with the patient or authorized representative who has indicated his/her understanding and acceptance.     Plan Discussed with: CRNA and Anesthesiologist  Anesthesia Plan Comments:         Anesthesia Quick Evaluation

## 2017-07-16 NOTE — ED Notes (Signed)
Blood is ready for the patient, however the patient is unsure if she wants to receive any "strangers" blood, MD Marily Memos informed and speaking with the patient.

## 2017-07-16 NOTE — H&P (View-Only) (Signed)
[facility] Consult: 8:19 AM 07/16/2017  LOS: 0 days    Referring Provider: Dr Marily Memos  Primary Care Physician:  Rita Ohara, MD Primary Gastroenterologist:  Chucky May, MD in [facility]     Reason for Consultation:  Symptomatic anemia.   HPI: Rhonda Gardner is a 61 y.o. female.  Past hx left breast cancer. S/p lumpectomy, SN biopsy 05/2015. Underwent chemo.  Porta cath removed 02/2016.    06/2016 Colonoscopy for hx cancer in mother.  Found only internal hemorrhoids (as was case in 2012).    One week ago, the patient had oral surgery to address an infected cyst in the root of her upper right molar. For a few days she took both acetaminophen and Aleve 1 every 8 hours. After the few days she stopped this but then over the weekend she started taking it again because of sinus pressure and headache. Normally she doesn't take any regular NSAIDs. After the oral surgery she was prescribed clindamycin and finished this last night. Since the oral surgery she's been feeling of malaise, fatigue. It came to a head yesterday when she was so fatigued and exhausted she felt she could hardly stand. She wasn't dizzy. She did notice tachycardia with minimal exertion. Felt short of breath and had a little bit of pressure in her chest which made her feel panicky. She came [facility] ED.   Hgb 7.4, was 13.5 in 01/2017.  MCV 92.  Elevated BUN 57.  Troponin normal.  She is about to receive 1 unit of PRBCs.  A single dose of IV Protonix has been ordered but not administered.    Patient hasn't had any GI symptoms. Her appetite is stable. No nausea. No abdominal pain. Has never needed to use acid suppressing meds. Stools are brown.   Family history significant for her mother being a colon cancer survivor. By the way her mother is  31 years old. As a young woman her mother had ulcers. Her father in his middle age had perforated ulcer. Patient doesn't smoke cigarettes. She rarely drinks any alcoholic beverages.   Past Medical History:  Diagnosis Date  . Allergy 2006   chemical cauterization in spring 2006, and 2nd treatment with good results.(Dr.Shoemaker)  . Bipolar disorder ([place])   . Breast cancer ([place]) 12/2014   invasive ductal carcinoma (LEFT)  . Breast cancer of upper-outer quadrant of left female breast ([place]) 12/22/2014  . Breast hematoma 12/19/2015   had left breast aspiration of 4cm hematoma-path indicates benign hematoma  . Osteoporosis 06/20/2016   T-2.5@spine  on Dexa ([facility])  . PONV (postoperative nausea and vomiting)     Past Surgical History:  Procedure Laterality Date  . BREAST BIOPSY Bilateral    benign and a right stereotatic breast biopsy.  . fibroid excision  age 65   prolapsed fibroid removed  . FOOT SURGERY Left 2014   Dr. Paulla Dolly  . FOOT SURGERY Right 08/2016   Dr. Paulla Dolly --Shortening Osteotomy with Fixation 1st Metatarsal   . PORT-A-CATH REMOVAL N/A 02/15/2016   Procedure: REMOVAL PORT-A-CATH;  Surgeon:  Autumn Messing III, MD;  Location: [place];  Service: General;  Laterality: N/A;  . RADIOACTIVE SEED GUIDED MASTECTOMY WITH AXILLARY SENTINEL LYMPH NODE BIOPSY Left 05/25/2015   Procedure: LEFT BREAST LUMPECTOMY WITH RADIOACTIVE SEED AND SENTINEL LYMPH NODE BIOPSY;  Surgeon: Autumn Messing III, MD;  Location: [facility];  Service: General;  Laterality: Left;    Prior to Admission medications   Medication Sig Start Date End Date Taking? Authorizing Provider  Cholecalciferol (VITAMIN D) 2000 UNITS tablet Take 2,000 Units by mouth daily.   Yes [provider]  fluticasone (FLONASE) 50 MCG/ACT nasal spray Place 2 sprays into both nostrils daily.   Yes [provider]  LAMICTAL 200 MG tablet Take 0.5 tablets (100 mg total) by mouth daily. 02/24/17  Yes  Rita Ohara, MD  LUTEIN-ZEAXANTHIN PO Take 1 tablet by mouth daily.   Yes [provider]  magnesium 30 MG tablet Take 30 mg by mouth daily.   Yes [provider]  Multiple Vitamins-Minerals (PRESERVISION AREDS PO) Take 2 capsules by mouth daily.   Yes [provider]  naproxen sodium (ANAPROX) 220 MG tablet Take 220-440 mg by mouth daily as needed (pain). Reported on 04/08/2016   Yes [provider]  phenylephrine (SUDAFED PE) 10 MG TABS tablet Take 10-30 mg by mouth every 4 (four) hours as needed (congestion).   Yes [provider]  promethazine (PHENERGAN) 25 MG tablet Take 25 mg by mouth every 6 (six) hours as needed for nausea or vomiting.   Yes Wallene Huh, DPM  saccharomyces boulardii (FLORASTOR) 250 MG capsule Take 250 mg by mouth 2 (two) times daily.   Yes [provider]    Scheduled Meds: . pantoprazole (PROTONIX) IV  40 mg Intravenous Once   Infusions: . sodium chloride     PRN Meds:    Allergies as of 07/16/2017 - Review Complete 07/16/2017  Allergen Reaction Noted  . Adhesive [tape] Rash 01/25/2015    Family History  Problem Relation Age of Onset  . Cancer Father        skin - non melanoma - farmer  . Heart disease Father   . Hypertension Mother   . Arthritis Mother        osteoarthritis  . Colon cancer Mother   . Macular degeneration Mother        wet and dry  . Cancer Mother 75       colon cancer at age 22  . Osteoporosis Sister        osteopenia (states it resolved)  . Cancer Maternal Aunt        lung cancer  . Cancer Paternal Aunt 35       ? stomach cancer  . Autism Sister   . Mental retardation Sister   . Blindness Sister        secondary to retrolental fibroplasia  . Osteoporosis Sister   . Goiter Paternal Aunt   . Diabetes Paternal Aunt     Social History   Social History  . Marital status: Married    Spouse name: N/A  . Number of children: N/A  . Years of education: N/A    Occupational History  . Not on file.   Social History Main Topics  . Smoking status: Never Smoker  . Smokeless tobacco: Never Used  . Alcohol use 0.0 oz/week     Comment: 1-2 glasses of wine a month  . Drug use: No  . Sexual activity: Yes  Partners: Male     Comment: postmenopausal   Other Topics Concern  . Not on file   Social History Narrative   Married [name]), 1 dog (chocolate lab). Homemaker    REVIEW OF SYSTEMS: Constitutional:  Weakness, malaise, fatigue. ENT:  No nose bleeds Pulm:  DOE CV:  No palpitations, no LE edema.  GU:  No hematuria, no frequency GI:  No issues with heartburn, dysphagia, nausea, vomiting, anorexia, abdominal pain. Heme:  No unusual or excessive bleeding or bruising.   Transfusions:  None Neuro:  Had some sinus pressure headaches over the weekend. Derm:  No itching, no rash or sores.  Endocrine:  No sweats or chills.  No polyuria or dysuria Immunization:  Not queried Travel:  None beyond local counties in last few months.    PHYSICAL EXAM: Vital signs in last 24 hours: Vitals:   07/16/17 0700 07/16/17 0715  BP: (!) 98/49 103/64  Pulse: 86 91  Resp: 19 16  Temp:    SpO2: 100% 100%   Wt Readings from Last 3 Encounters:  07/16/17 63.5 kg (140 lb)  04/10/17 66 kg (145 lb 9.6 oz)  02/10/17 68.1 kg (150 lb 1.6 oz)    General: Pale but otherwise well appearing WF. She is comfortable and fully alert Head:  No facial asymmetry or swelling. No signs of head trauma.  Eyes:  No scleral icterus, no conjunctival pallor. EOMI. Ears:  No hearing deficit  Nose:  No congestion or discharge Mouth:  Slight, hardly noticeable, swelling and bruising on the right cheek. Oropharynx is moist and clear. There is no blood or visible gum lesion. Neck:  No JVD, no masses, no thyromegaly. Lungs:  Clear bilaterally. No dyspnea or cough. Heart: RRR. No MRG. S1, S2 present. Abdomen:  Nonobese. Nontender. No masses, hernias, bruits, HSM. Bowel sounds  active..   Rectal: Did not repeat rectal exam. Per ED staff stool brown and FOBT positive.   Musc/Skeltl: No musculoskeletal deformities. No joint redness. Extremities:  No CCE.  Neurologic:  Alert. Oriented times 3. Good historian. No tremor, no limb weakness. Skin:  No rash, no sores, no telangiectasia. Tattoos:  None visualized. Nodes:  No cervical or inguinal adenopathy.   Psych:  Calm. Pleasant. Intelligent. Speech fluid.  Intake/Output from previous day: No intake/output data recorded. Intake/Output this shift: No intake/output data recorded.  LAB RESULTS:  Recent Labs  07/16/17 0450  WBC 7.2  HGB 7.4*  HCT 22.1*  PLT 227   BMET Lab Results  Component Value Date   NA 136 07/16/2017   NA 142 02/10/2017   NA 145 07/22/2016   K 3.5 07/16/2017   K 4.1 02/10/2017   K 4.1 07/22/2016   CL 104 07/16/2017   CL 106 01/13/2015   CL 101 12/01/2014   CO2 21 (L) 07/16/2017   CO2 26 02/10/2017   CO2 27 07/22/2016   GLUCOSE 173 (H) 07/16/2017   GLUCOSE 83 02/10/2017   GLUCOSE 82 07/22/2016   BUN 57 (H) 07/16/2017   BUN 17.0 02/10/2017   BUN 14.5 07/22/2016   CREATININE 0.80 07/16/2017   CREATININE 0.8 02/10/2017   CREATININE 0.8 07/22/2016   CALCIUM 8.7 (L) 07/16/2017   CALCIUM 9.7 02/10/2017   CALCIUM 9.0 07/22/2016   LFT  Recent Labs  07/16/17 0450  PROT 5.1*  ALBUMIN 3.2*  AST 20  ALT 15  ALKPHOS 37*  BILITOT 0.6  BILIDIR <0.1*  IBILI NOT CALCULATED   PT/INR Lab Results  Component Value Date  INR 0.95 01/13/2015   Lipase     Component Value Date/Time   LIPASE 29 07/16/2017 0450    RADIOLOGY STUDIES: Dg Chest 2 View  Result Date: 07/16/2017 CLINICAL DATA:  Weakness, shortness of breath, tightness in the chest for 3 days. EXAM: CHEST  2 VIEW COMPARISON:  None. FINDINGS: Mild hyperinflation. Normal heart size and pulmonary vascularity. No focal airspace disease or consolidation in the lungs. No blunting of costophrenic angles. No pneumothorax.  Mediastinal contours appear intact. Surgical clips in the left axilla. IMPRESSION: No active cardiopulmonary disease. Electronically Signed   By: Lucienne Capers M.D.   On: 07/16/2017 05:42     IMPRESSION:   *  Normocytic anemia, symptomatic.  One unit PRBCs infusing currently. FOBT positive but no black or melenic stool. Rule out ulcer disease with recent use of Aleve. Although she had oral surgery there's not been any oral bleeding associated with this procedure performed last week.  *  Maternal hx colon cancer.  Pt has not had colon polyps on colonoscopies 2007, 2012, 2017  *  Recent oral surgery 1 week ago.  No associated oral bleeding.  Infected oral cyst.  Finished course of Clindamycin 10/2 in PM.      PLAN:     *  Patient has been nothing by mouth and planning EGD for later this morning versus early afternoon. Patient aware. Risks, benefits discussed with patient.   Azucena Freed  07/16/2017, 8:19 AM Pager: [phone]     Attending physician's note   I have taken a history, examined the patient and reviewed the chart. I agree with the Advanced Practitioner's note, impression and recommendations. Normocytic anemia and FOBT positive stool. R/O ulcer, gastritis, etc. Transfuse to keep Hb > 7-8. IV PPI and avoid NSAIDs. EGD today. Outpatient GI follow with Dr. Chucky May.   Lucio Edward, MD [facility] [phone] Mon-Fri 8a-5p [phone] after 5p, weekends, holidays

## 2017-07-16 NOTE — Interval H&P Note (Signed)
History and Physical Interval Note:  07/16/2017 11:32 AM  Rhonda Gardner  has presented today for surgery, with the diagnosis of Anemia. Heme positive stool. Stool is brown.  The various methods of treatment have been discussed with the patient and family. After consideration of risks, benefits and other options for treatment, the patient has consented to  Procedure(s): ESOPHAGOGASTRODUODENOSCOPY (EGD) (N/A) as a surgical intervention .  [name] history has been reviewed, patient examined, no change in status, stable for surgery.  I have reviewed the patient's chart and labs.  Questions were answered to the patient's satisfaction.     Pricilla Riffle. [facility] Plan

## 2017-07-16 NOTE — H&P (Addendum)
History and Physical    Rhonda Gardner RXV:[id number] DOB: 11/03/1955 DOA: 07/16/2017  PCP: Rita Ohara, MD Patient coming from: home  Chief Complaint: weakness   HPI: Rhonda Gardner is a 61 y.o. female with medical history significant of breast cancer, bipolar disorder, osteoporosis. Patient presents with one-week history of progressive weakness, shortness of breath on exertion, and progressively dark stools. Patient reports one week prior to admission having a dental procedure with drainage of a dental abscess. Since that time she has been taking Aleve and Tylenol exclusively for pain. She reports taking 1 Aleve every 8 hours. Patient reports having a normal colonoscopy one year ago in [place]. On day of admission patient had a single episode of nausea and dark emesis which was seen by the EDP is concern for there being blood in the emesis. Patient denies hematochezia, bright red blood per rectum, coffee-ground emesis, abdominal pain, dysuria, frequency, fever, chest pain, palpitations, neck stiffness, focal neurological deficit, LOC. Sx improve w/ lying down and worsen w/ sitting or ambulating   ED Course: 1L NS bolus, Protonix given 1 unit PRBC ordered  Review of Systems: As per HPI otherwise all other systems reviewed and are negative  Ambulatory Status: no restrictions  Past Medical History:  Diagnosis Date  . Allergy 2006   chemical cauterization in spring 2006, and 2nd treatment with good results.(Dr.Shoemaker)  . Bipolar disorder ([place])   . Breast cancer ([place]) 12/2014   invasive ductal carcinoma (LEFT)  . Breast cancer of upper-outer quadrant of left female breast ([place]) 12/22/2014  . Breast hematoma 12/19/2015   had left breast aspiration of 4cm hematoma-path indicates benign hematoma  . Osteoporosis 06/20/2016   T-2.5@spine  on Dexa ([facility])  . PONV (postoperative nausea and vomiting)     Past Surgical History:  Procedure Laterality Date  . BREAST BIOPSY  Bilateral    benign and a right stereotatic breast biopsy.  . fibroid excision  age 16   prolapsed fibroid removed  . FOOT SURGERY Left 2014   Dr. Paulla Dolly  . FOOT SURGERY Right 08/2016   Dr. Paulla Dolly --Shortening Osteotomy with Fixation 1st Metatarsal   . PORT-A-CATH REMOVAL N/A 02/15/2016   Procedure: REMOVAL PORT-A-CATH;  Surgeon: Autumn Messing III, MD;  Location: [facility];  Service: General;  Laterality: N/A;  . RADIOACTIVE SEED GUIDED MASTECTOMY WITH AXILLARY SENTINEL LYMPH NODE BIOPSY Left 05/25/2015   Procedure: LEFT BREAST LUMPECTOMY WITH RADIOACTIVE SEED AND SENTINEL LYMPH NODE BIOPSY;  Surgeon: Autumn Messing III, MD;  Location: [facility];  Service: General;  Laterality: Left;    Social History   Social History  . Marital status: Married    Spouse name: N/A  . Number of children: N/A  . Years of education: N/A   Occupational History  . Not on file.   Social History Main Topics  . Smoking status: Never Smoker  . Smokeless tobacco: Never Used  . Alcohol use 0.0 oz/week     Comment: 1-2 glasses of wine a month  . Drug use: No  . Sexual activity: Yes    Partners: Male     Comment: postmenopausal   Other Topics Concern  . Not on file   Social History Narrative   Married [name]), 1 dog (chocolate lab). Homemaker    Allergies  Allergen Reactions  . Adhesive [Tape] Rash    Pt is sensitive to any kind of adhesive tape products.    Family History  Problem Relation Age of Onset  .  Cancer Father        skin - non melanoma - farmer  . Heart disease Father   . Hypertension Mother   . Arthritis Mother        osteoarthritis  . Colon cancer Mother   . Macular degeneration Mother        wet and dry  . Cancer Mother 69       colon cancer at age 58  . Osteoporosis Sister        osteopenia (states it resolved)  . Cancer Maternal Aunt        lung cancer  . Cancer Paternal Aunt 53       ? stomach cancer  . Autism Sister   . Mental retardation Sister    . Blindness Sister        secondary to retrolental fibroplasia  . Osteoporosis Sister   . Goiter Paternal Aunt   . Diabetes Paternal Aunt       Prior to Admission medications   Medication Sig Start Date End Date Taking? Authorizing Provider  Cholecalciferol (VITAMIN D) 2000 UNITS tablet Take 2,000 Units by mouth daily.   Yes [provider]  fluticasone (FLONASE) 50 MCG/ACT nasal spray Place 2 sprays into both nostrils daily.   Yes [provider]  LAMICTAL 200 MG tablet Take 0.5 tablets (100 mg total) by mouth daily. 02/24/17  Yes Rita Ohara, MD  LUTEIN-ZEAXANTHIN PO Take 1 tablet by mouth daily.   Yes [provider]  magnesium 30 MG tablet Take 30 mg by mouth daily.   Yes [provider]  Multiple Vitamins-Minerals (PRESERVISION AREDS PO) Take 2 capsules by mouth daily.   Yes [provider]  naproxen sodium (ANAPROX) 220 MG tablet Take 220-440 mg by mouth daily as needed (pain). Reported on 04/08/2016   Yes [provider]  phenylephrine (SUDAFED PE) 10 MG TABS tablet Take 10-30 mg by mouth every 4 (four) hours as needed (congestion).   Yes [provider]  promethazine (PHENERGAN) 25 MG tablet Take 25 mg by mouth every 6 (six) hours as needed for nausea or vomiting.   Yes Wallene Huh, DPM  saccharomyces boulardii (FLORASTOR) 250 MG capsule Take 250 mg by mouth 2 (two) times daily.   Yes [provider]    Physical Exam: Vitals:   07/16/17 0700 07/16/17 0715 07/16/17 0745 07/16/17 0800  BP: (!) 98/49 103/64 101/61 97/61  Pulse: 86 91 87 85  Resp: 19 16 (!) 22 (!) 21  Temp:      TempSrc:      SpO2: 100% 100% 100% 100%  Weight:      Height:         General: plel but appears calm and comfortable Eyes:  PERRL, EOMI, normal lids, iris ENT:  grossly normal hearing, lips & tongue, mmm Neck:  no LAD, masses or thyromegaly Cardiovascular:  RRR, no m/r/g. No LE edema.  Respiratory:  CTA bilaterally, no  w/r/r. Normal respiratory effort. Abdomen:  soft, ntnd, NABS Skin:  no rash or induration seen on limited exam Musculoskeletal:  grossly normal tone BUE/BLE, good ROM, no bony abnormality Psychiatric:  grossly normal mood and affect, speech fluent and appropriate, AOx3 Neurologic:  CN 2-12 grossly intact, moves all extremities in coordinated fashion, sensation intact  Labs on Admission: I have personally reviewed following labs and imaging studies  CBC:  Recent Labs Lab 07/16/17 0450  WBC 7.2  HGB 7.4*  HCT 22.1*  MCV 92.5  PLT  785   Basic Metabolic Panel:  Recent Labs Lab 07/16/17 0450  NA 136  K 3.5  CL 104  CO2 21*  GLUCOSE 173*  BUN 57*  CREATININE 0.80  CALCIUM 8.7*   GFR: Estimated Creatinine Clearance: 70 mL/min (by C-G formula based on SCr of 0.8 mg/dL). Liver Function Tests:  Recent Labs Lab 07/16/17 0450  AST 20  ALT 15  ALKPHOS 37*  BILITOT 0.6  PROT 5.1*  ALBUMIN 3.2*    Recent Labs Lab 07/16/17 0450  LIPASE 29   No results for input(s): AMMONIA in the last 168 hours. Coagulation Profile: No results for input(s): INR, PROTIME in the last 168 hours. Cardiac Enzymes: No results for input(s): CKTOTAL, CKMB, CKMBINDEX, TROPONINI in the last 168 hours. BNP (last 3 results) No results for input(s): PROBNP in the last 8760 hours. HbA1C: No results for input(s): HGBA1C in the last 72 hours. CBG: No results for input(s): GLUCAP in the last 168 hours. Lipid Profile: No results for input(s): CHOL, HDL, LDLCALC, TRIG, CHOLHDL, LDLDIRECT in the last 72 hours. Thyroid Function Tests: No results for input(s): TSH, T4TOTAL, FREET4, T3FREE, THYROIDAB in the last 72 hours. Anemia Panel: No results for input(s): VITAMINB12, FOLATE, FERRITIN, TIBC, IRON, RETICCTPCT in the last 72 hours. Urine analysis:    Component Value Date/Time   LABSPEC 1.005 04/03/2015 1451   PHURINE 7.5 04/03/2015 1451   PHURINE 7.5 08/09/2007 2121   GLUCOSEU Negative  04/03/2015 1451   HGBUR Trace 04/03/2015 1451   HGBUR SMALL (A) 08/09/2007 2121   BILIRUBINUR neg 04/10/2017 0858   BILIRUBINUR Negative 04/03/2015 1451   KETONESUR Negative 04/03/2015 1451   [place] 08/09/2007 2121   PROTEINUR neg 04/10/2017 0858   PROTEINUR Negative 04/03/2015 1451   PROTEINUR NEGATIVE 08/09/2007 2121   UROBILINOGEN negative (A) 04/10/2017 0858   UROBILINOGEN 0.2 04/03/2015 1451   NITRITE neg 04/10/2017 0858   NITRITE Negative 04/03/2015 1451   NITRITE NEGATIVE 08/09/2007 2121   LEUKOCYTESUR Negative 04/10/2017 0858   LEUKOCYTESUR Small 04/03/2015 1451    Creatinine Clearance: Estimated Creatinine Clearance: 70 mL/min (by C-G formula based on SCr of 0.8 mg/dL).  Sepsis Labs: @LABRCNTIP (procalcitonin:4,lacticidven:4) )No results found for this or any previous visit (from the past 240 hour(s)).   Radiological Exams on Admission: Dg Chest 2 View  Result Date: 07/16/2017 CLINICAL DATA:  Weakness, shortness of breath, tightness in the chest for 3 days. EXAM: CHEST  2 VIEW COMPARISON:  None. FINDINGS: Mild hyperinflation. Normal heart size and pulmonary vascularity. No focal airspace disease or consolidation in the lungs. No blunting of costophrenic angles. No pneumothorax. Mediastinal contours appear intact. Surgical clips in the left axilla. IMPRESSION: No active cardiopulmonary disease. Electronically Signed   By: Lucienne Capers M.D.   On: 07/16/2017 05:42    EKG: Independently reviewed. Sinus tachycardia, no ACS  Assessment/Plan Active Problems:   Symptomatic anemia   Upper GI bleed   Bipolar 1 disorder (HCC)   Tachycardia   Hypotension due to hypovolemia    Symptomatic anemia: Likely secondary to GI source of bleed as discussed below. Hemoglobin 7.4 with baseline 13.5. Normocytic. Patient pale in appearance and tachycardic. Symptoms improving with fluid bolus and continued IVF. - Anemia panel (canceled as PRBC given prior to lab draw) - 2  units PRBC (only concerned about risks of hepatitis and HIV transmission. I discussed this with the blood bank to give significant reassurance that the reunion is tested and transmission rates are exceptionally low. Family aware that nothing  is risk free but that risks are significantly mitigated with current policies. Family okay to proceed with transfusion.) - Posterior transfusion labs  GI bleed: Likely upper GI bleed. Normal colonoscopy one year prior to current episode. FOBT positive. Black stool per EDP on rectal exam. One week prior to admission patient using naproxen 3 times a day. No history of gastric pathology. One single bout of nausea and dark emesis in the emergency room. Marked elevation in patient's BUN from a baseline of 17-57 on day of admission. Normal creatinine. Patient's GI physician is in [place]. EDP discussed case with [facility]. Anticipate upper endoscopy. - Further management per [facility] - likely upper endoscopy - Protonix 40 IV BID - HOld naproxen. Tramadol for oral pain  Bipolar: - continue lamictal  Tachycardia/Hypotension: likely secondary to volume depletion. Improving after 1 L normal saline bolus. Anticipate continued improvement after 2 unit PRBC transfusion. EKG without signs of ACS. No history of CAD. Troponin normal. Continue IVF - NS 125cc/hr x 1 L - routine VS monitoring  DVT prophylaxis: SCD  Code Status: full  Family Communication: husband  Disposition Plan: pending improvement in sx after PRBC and GI eval for upper GI bleed  Consults called: GI - [place]  Admission status: inpt    Waldemar Dickens MD [facility]  If 7PM-7AM, please contact night-coverage [email or web address] Password [email or web address]  07/16/2017, 8:44 AM

## 2017-07-17 ENCOUNTER — Encounter (HOSPITAL_COMMUNITY): Payer: Self-pay | Admitting: Gastroenterology

## 2017-07-17 DIAGNOSIS — E861 Hypovolemia: Secondary | ICD-10-CM

## 2017-07-17 DIAGNOSIS — F319 Bipolar disorder, unspecified: Secondary | ICD-10-CM

## 2017-07-17 DIAGNOSIS — I9589 Other hypotension: Secondary | ICD-10-CM

## 2017-07-17 DIAGNOSIS — D62 Acute posthemorrhagic anemia: Secondary | ICD-10-CM

## 2017-07-17 LAB — RETICULOCYTES
RBC.: 2.4 MIL/uL — AB (ref 3.87–5.11)
RETIC COUNT ABSOLUTE: 76.8 10*3/uL (ref 19.0–186.0)
RETIC CT PCT: 3.2 % — AB (ref 0.4–3.1)

## 2017-07-17 LAB — FERRITIN: Ferritin: 45 ng/mL (ref 11–307)

## 2017-07-17 LAB — IRON AND TIBC
Iron: 120 ug/dL (ref 28–170)
SATURATION RATIOS: 35 % — AB (ref 10.4–31.8)
TIBC: 344 ug/dL (ref 250–450)
UIBC: 224 ug/dL

## 2017-07-17 LAB — BASIC METABOLIC PANEL
Anion gap: 6 (ref 5–15)
BUN: 14 mg/dL (ref 6–20)
CALCIUM: 7.5 mg/dL — AB (ref 8.9–10.3)
CO2: 23 mmol/L (ref 22–32)
CREATININE: 0.77 mg/dL (ref 0.44–1.00)
Chloride: 112 mmol/L — ABNORMAL HIGH (ref 101–111)
GFR calc Af Amer: >60 mL/min (ref >60)
GFR calc non Af Amer: >60 mL/min (ref >60)
GLUCOSE: 100 mg/dL — AB (ref 65–99)
Potassium: 3.6 mmol/L (ref 3.5–5.1)
Sodium: 141 mmol/L (ref 135–145)

## 2017-07-17 LAB — BPAM RBC
Blood Product Expiration Date: 201810102359
Blood Product Expiration Date: 201810252359
ISSUE DATE / TIME: 201810030828
ISSUE DATE / TIME: 201810032007
UNIT TYPE AND RH: 5100
Unit Type and Rh: 5100

## 2017-07-17 LAB — CBC
HEMATOCRIT: 22.2 % — AB (ref 36.0–46.0)
Hemoglobin: 7.4 g/dL — ABNORMAL LOW (ref 12.0–15.0)
MCH: 29.6 pg (ref 26.0–34.0)
MCHC: 33.3 g/dL (ref 30.0–36.0)
MCV: 88.8 fL (ref 78.0–100.0)
PLATELETS: 130 10*3/uL — AB (ref 150–400)
RBC: 2.5 MIL/uL — ABNORMAL LOW (ref 3.87–5.11)
RDW: 16.6 % — AB (ref 11.5–15.5)
WBC: 5.9 10*3/uL (ref 4.0–10.5)

## 2017-07-17 LAB — TYPE AND SCREEN
ABO/RH(D): O POS
ANTIBODY SCREEN: NEGATIVE
UNIT DIVISION: 0
UNIT DIVISION: 0

## 2017-07-17 MED ORDER — ACETAMINOPHEN 325 MG PO TABS
650.0000 mg | ORAL_TABLET | Freq: Four times a day (QID) | ORAL | Status: DC | PRN
  Administered 2017-07-17 – 2017-07-18 (×2): 650 mg via ORAL
  Filled 2017-07-17 (×2): qty 2

## 2017-07-17 NOTE — Progress Notes (Addendum)
PROGRESS NOTE    Rhonda Gardner   KZS:[id number]  DOB: May 02, 1956  DOA: 07/16/2017 PCP: Rita Ohara, MD   Brief Narrative:  Rhonda Gardner is a 61 y.o. female with medical history significant of breast cancer, bipolar disorder, osteoporosis who presented to the hospital for dark stools, bloody vomitus and feeling weak. She had been taking NSAIDs after a dental procedure.    Subjective: No nausea, vomiting, bloody stool or abdominal pain.    Assessment & Plan:   Active Problems:      Upper GI bleed   Duodenal ulcer with hemorrhage - EGD 10/3 showed an oozing duodenal ulcer - see report below - cont Protonix infusion until Sat per GI - full liquids today - discussed avoidance of NSAIDs  Symptomatic anemia - given 2 U PRBC on 10/3 - Hb did not rise but has stayed the same around 7 - follow for symptoms and decide if further transfusion needed - anemia panel with next blood draw  Thrombocytopenia  - acute- may be consumption due to bleeding- follow    Bipolar 1 disorder ([place]) - Lamictal    Hypotension due to hypovolemia - follow   DVT prophylaxis: SCDs Code Status: Full code Family Communication:  Disposition Plan: home on Saturday  Consultants:   GI Procedures:   EGD 10/3 Impression:               - Normal esophagus.                           - Small hiatal hernia.                           - One oozing duodenal ulcer with a visible vessel.                            Treatment with epinephrine not successful.                            Treatment with Bicap was successful.                           - No specimens collected. Antimicrobials:  Anti-infectives    None       Objective: Vitals:   07/16/17 2045 07/17/17 0010 07/17/17 0507 07/17/17 0900  BP: (!) 96/38 (!) 98/40 (!) 101/39 (!) 114/45  Pulse: 84 84 81 77  Resp: 18 18 16 16   Temp: 98.9 F (37.2 C) 98.7 F (37.1 C) 98.4 F (36.9 C) (!) 97.5 F (36.4 C)  TempSrc: Oral Oral  Oral Oral  SpO2: 99%  99% 99%  Weight:      Height:        Intake/Output Summary (Last 24 hours) at 07/17/17 1515 Last data filed at 07/17/17 1300  Gross per 24 hour  Intake          3613.75 ml  Output                0 ml  Net          3613.75 ml   Filed Weights   07/16/17 0449  Weight: 63.5 kg (140 lb)    Examination: General exam: Appears comfortable  HEENT: PERRLA, oral mucosa moist, no sclera icterus or thrush Respiratory system: Clear to auscultation.  Respiratory effort normal. Cardiovascular system: S1 & S2 heard, RRR.  No murmurs  Gastrointestinal system: Abdomen soft, non-tender, nondistended. Normal bowel sound. No organomegaly Central nervous system: Alert and oriented. No focal neurological deficits. Extremities: No cyanosis, clubbing or edema Skin: No rashes or ulcers Psychiatry:  Mood & affect appropriate.     Data Reviewed: I have personally reviewed following labs and imaging studies  CBC:  Recent Labs Lab 07/16/17 0450 07/16/17 1350 07/17/17 0213  WBC 7.2  --  5.9  HGB 7.4* 7.5* 7.4*  HCT 22.1* 22.0* 22.2*  MCV 92.5  --  88.8  PLT 227  --  585*   Basic Metabolic Panel:  Recent Labs Lab 07/16/17 0450 07/17/17 0213  NA 136 141  K 3.5 3.6  CL 104 112*  CO2 21* 23  GLUCOSE 173* 100*  BUN 57* 14  CREATININE 0.80 0.77  CALCIUM 8.7* 7.5*   GFR: Estimated Creatinine Clearance: 70 mL/min (by C-G formula based on SCr of 0.77 mg/dL). Liver Function Tests:  Recent Labs Lab 07/16/17 0450  AST 20  ALT 15  ALKPHOS 37*  BILITOT 0.6  PROT 5.1*  ALBUMIN 3.2*    Recent Labs Lab 07/16/17 0450  LIPASE 29   No results for input(s): AMMONIA in the last 168 hours. Coagulation Profile: No results for input(s): INR, PROTIME in the last 168 hours. Cardiac Enzymes: No results for input(s): CKTOTAL, CKMB, CKMBINDEX, TROPONINI in the last 168 hours. BNP (last 3 results) No results for input(s): PROBNP in the last 8760 hours. HbA1C: No results  for input(s): HGBA1C in the last 72 hours. CBG: No results for input(s): GLUCAP in the last 168 hours. Lipid Profile: No results for input(s): CHOL, HDL, LDLCALC, TRIG, CHOLHDL, LDLDIRECT in the last 72 hours. Thyroid Function Tests: No results for input(s): TSH, T4TOTAL, FREET4, T3FREE, THYROIDAB in the last 72 hours. Anemia Panel:  Recent Labs  07/17/17 1057  FERRITIN 45  TIBC 344  IRON 120  RETICCTPCT 3.2*   Urine analysis:    Component Value Date/Time   LABSPEC 1.005 04/03/2015 1451   PHURINE 7.5 04/03/2015 1451   PHURINE 7.5 08/09/2007 2121   GLUCOSEU Negative 04/03/2015 1451   HGBUR Trace 04/03/2015 1451   HGBUR SMALL (A) 08/09/2007 2121   BILIRUBINUR neg 04/10/2017 0858   BILIRUBINUR Negative 04/03/2015 1451   KETONESUR Negative 04/03/2015 1451   [place] 08/09/2007 2121   PROTEINUR neg 04/10/2017 0858   PROTEINUR Negative 04/03/2015 1451   PROTEINUR NEGATIVE 08/09/2007 2121   UROBILINOGEN negative (A) 04/10/2017 0858   UROBILINOGEN 0.2 04/03/2015 1451   NITRITE neg 04/10/2017 0858   NITRITE Negative 04/03/2015 1451   NITRITE NEGATIVE 08/09/2007 2121   LEUKOCYTESUR Negative 04/10/2017 0858   LEUKOCYTESUR Small 04/03/2015 1451   Sepsis Labs: @LABRCNTIP (procalcitonin:4,lacticidven:4) )No results found for this or any previous visit (from the past 240 hour(s)).       Radiology Studies: Dg Chest 2 View  Result Date: 07/16/2017 CLINICAL DATA:  Weakness, shortness of breath, tightness in the chest for 3 days. EXAM: CHEST  2 VIEW COMPARISON:  None. FINDINGS: Mild hyperinflation. Normal heart size and pulmonary vascularity. No focal airspace disease or consolidation in the lungs. No blunting of costophrenic angles. No pneumothorax. Mediastinal contours appear intact. Surgical clips in the left axilla. IMPRESSION: No active cardiopulmonary disease. Electronically Signed   By: Lucienne Capers M.D.   On: 07/16/2017 05:42      Scheduled Meds: .  lamoTRIgine  100 mg Oral Daily  Continuous Infusions: . pantoprozole (PROTONIX) infusion 8 mg/hr (07/17/17 0941)     LOS: 1 day    Time spent in minutes: 46 min    Debbe Odea, MD [facility] Pager: [email or web address] Password [email or web address] 07/17/2017, 3:15 PM

## 2017-07-17 NOTE — Progress Notes (Addendum)
    Progress Note   Subjective   No bowel movements, vomiting, dizziness.    Objective  Vital signs in last 24 hours: Temp:  [98.3 F (36.8 C)-99.2 F (37.3 C)] 98.4 F (36.9 C) (10/04 0507) Pulse Rate:  [81-99] 81 (10/04 0507) Resp:  [12-22] 16 (10/04 0507) BP: (92-114)/(33-60) 101/39 (10/04 0507) SpO2:  [99 %-100 %] 99 % (10/04 0507) Last BM Date: 07/16/17  General: Alert, well-developed, in NAD Heart:  Regular rate and rhythm; no murmurs Chest: Clear to ascultation bilaterally Abdomen:  Soft, nontender and nondistended. Normal bowel sounds, without guarding, and without rebound.   Extremities:  Without edema. Neurologic:  Alert and  oriented x4; grossly normal neurologically. Psych:  Alert and cooperative. Normal mood and affect.  Intake/Output from previous day: 10/03 0701 - 10/04 0700 In: 5028.8 [P.O.:1300; I.V.:526.3; Blood:3202.5] Out: 0  Intake/Output this shift: No intake/output data recorded.  Lab Results:  Recent Labs  07/16/17 0450 07/16/17 1350 07/17/17 0213  WBC 7.2  --  5.9  HGB 7.4* 7.5* 7.4*  HCT 22.1* 22.0* 22.2*  PLT 227  --  130*   BMET  Recent Labs  07/16/17 0450 07/17/17 0213  NA 136 141  K 3.5 3.6  CL 104 112*  CO2 21* 23  GLUCOSE 173* 100*  BUN 57* 14  CREATININE 0.80 0.77  CALCIUM 8.7* 7.5*   LFT  Recent Labs  07/16/17 0450  PROT 5.1*  ALBUMIN 3.2*  AST 20  ALT 15  ALKPHOS 37*  BILITOT 0.6  BILIDIR <0.1*  IBILI NOT CALCULATED     Studies/Results: Dg Chest 2 View  Result Date: 07/16/2017 CLINICAL DATA:  Weakness, shortness of breath, tightness in the chest for 3 days. EXAM: CHEST  2 VIEW COMPARISON:  None. FINDINGS: Mild hyperinflation. Normal heart size and pulmonary vascularity. No focal airspace disease or consolidation in the lungs. No blunting of costophrenic angles. No pneumothorax. Mediastinal contours appear intact. Surgical clips in the left axilla. IMPRESSION: No active cardiopulmonary disease.  Electronically Signed   By: Lucienne Capers M.D.   On: 07/16/2017 05:42      Assessment & Plan   1. Duodenal ulcer with active bleed and visible vessel treated with epi inj and bicap cautery. No recurrent bleeding noted. Hb has stabilized. BUN has normalized. Advance to full liquid diet. Continue IV PPI infusion for 72 hours which will be Saturday at noon, then PPI po bid for 2 weeks, then PPI po qam long term. Avoid ASA/NSAIDs long term. H pylori Ab is pending-treat as outpatient if positive. GI follow up with Dr. Chucky May.   2. ABL anemia. Hb stable at 7.4. Trend CBC for now. Transfusion for Hb < 7.   GI signing off. Please call if needed.    LOS: 1 day    Zaeem Kandel T. Fuller Plan MD 07/17/2017, 10:36 AM

## 2017-07-17 NOTE — Anesthesia Postprocedure Evaluation (Signed)
Anesthesia Post Note  Patient: Rhonda Gardner  Procedure(s) Performed: ESOPHAGOGASTRODUODENOSCOPY (EGD) (N/A )     Patient location during evaluation: PACU Anesthesia Type: MAC Level of consciousness: awake and alert Pain management: pain level controlled Vital Signs Assessment: post-procedure vital signs reviewed and stable Respiratory status: spontaneous breathing, nonlabored ventilation, respiratory function stable and patient connected to nasal cannula oxygen Cardiovascular status: stable and blood pressure returned to baseline Postop Assessment: no apparent nausea or vomiting Anesthetic complications: no    Last Vitals:  Vitals:   07/17/17 0507 07/17/17 0900  BP: (!) 101/39 (!) 114/45  Pulse: 81 77  Resp: 16 16  Temp: 36.9 C (!) 36.4 C  SpO2: 99% 99%    Last Pain:  Vitals:   07/17/17 1451  TempSrc:   PainSc: 0-No pain   Pain Goal: Patients Stated Pain Goal: 0 (07/17/17 1351)               Tenzin Edelman S

## 2017-07-18 DIAGNOSIS — R000 Tachycardia, unspecified: Secondary | ICD-10-CM

## 2017-07-18 DIAGNOSIS — K922 Gastrointestinal hemorrhage, unspecified: Secondary | ICD-10-CM

## 2017-07-18 DIAGNOSIS — K921 Melena: Secondary | ICD-10-CM

## 2017-07-18 LAB — CBC
HEMATOCRIT: 22.8 % — AB (ref 36.0–46.0)
HEMOGLOBIN: 7.5 g/dL — AB (ref 12.0–15.0)
MCH: 29.5 pg (ref 26.0–34.0)
MCHC: 32.9 g/dL (ref 30.0–36.0)
MCV: 89.8 fL (ref 78.0–100.0)
Platelets: 164 10*3/uL (ref 150–400)
RBC: 2.54 MIL/uL — AB (ref 3.87–5.11)
RDW: 16.6 % — ABNORMAL HIGH (ref 11.5–15.5)
WBC: 5 10*3/uL (ref 4.0–10.5)

## 2017-07-18 LAB — VITAMIN B12: Vitamin B-12: 317 pg/mL (ref 180–914)

## 2017-07-18 LAB — H. PYLORI ANTIBODY, IGG

## 2017-07-18 LAB — FOLATE: Folate: 21.5 ng/mL (ref >5.9)

## 2017-07-18 MED ORDER — CYANOCOBALAMIN 1000 MCG/ML IJ SOLN
1000.0000 ug | Freq: Once | INTRAMUSCULAR | Status: AC
Start: 2017-07-18 — End: 2017-07-18
  Administered 2017-07-18: 1000 ug via SUBCUTANEOUS
  Filled 2017-07-18: qty 1

## 2017-07-18 MED ORDER — FERROUS SULFATE 325 (65 FE) MG PO TABS
325.0000 mg | ORAL_TABLET | Freq: Two times a day (BID) | ORAL | Status: DC
  Administered 2017-07-18 – 2017-07-19 (×2): 325 mg via ORAL
  Filled 2017-07-18 (×2): qty 1

## 2017-07-18 NOTE — Progress Notes (Signed)
PROGRESS NOTE    Rhonda Gardner   ZOX:[id number]  DOB: [birth year]  DOA: 07/16/2017 PCP: Rita Ohara, MD   Brief Narrative:  Rhonda Gardner is a 61 y.o. female with medical history significant of breast cancer, bipolar disorder, osteoporosis who presented to the hospital for dark stools, bloody vomitus and feeling weak. She had been taking NSAIDs after a dental procedure.    Subjective: No complaints of nausea, vomiting, bloody stool or abdominal pain. Not light headed when ambulating.    Assessment & Plan:   Active Problems:      Upper GI bleed   Duodenal ulcer with hemorrhage - EGD 10/3 showed an oozing duodenal ulcer - see report below - cont Protonix infusion until Sat per GI - advance to soft diet today - discussed avoidance of NSAIDs  Symptomatic anemia - given 2 U PRBC on 10/3 - Hb did not rise but has stayed the same around 7- not symptomatic and no need for further transfusion - anemia panel shows low Iron stores and low normal B12 - will start oral Iron and give s/c B12 1000 mcg once  Thrombocytopenia  - acute- may be consumption due to bleeding- has improved    Bipolar 1 disorder ([facility]) - Lamictal    Hypotension due to hypovolemia - follow   DVT prophylaxis: SCDs Code Status: Full code Family Communication:  Disposition Plan: home on Saturday  Consultants:   GI Procedures:   EGD 10/3 Impression:               - Normal esophagus.                           - Small hiatal hernia.                           - One oozing duodenal ulcer with a visible vessel.                            Treatment with epinephrine not successful.                            Treatment with Bicap was successful.                           - No specimens collected. Antimicrobials:  Anti-infectives    None       Objective: Vitals:   07/17/17 1923 07/17/17 2117 07/18/17 0401 07/18/17 0745  BP: (!) 129/59 (!) 110/41 (!) 96/46 (!) 105/49  Pulse: 81 94 80 77    Resp: 16 16 16 16   Temp: 98.6 F (37 C) 97.9 F (36.6 C) 98.4 F (36.9 C) 99 F (37.2 C)  TempSrc: Oral Oral Oral Oral  SpO2:  100% 100% 100%  Weight:      Height:        Intake/Output Summary (Last 24 hours) at 07/18/17 1429 Last data filed at 07/18/17 0958  Gross per 24 hour  Intake             1180 ml  Output                4 ml  Net             1176 ml   Filed Weights   07/16/17 0449  Weight: 63.5 kg (140 lb)    Examination: General exam: Appears comfortable  HEENT: PERRLA, oral mucosa moist, no sclera icterus or thrush Respiratory system: Clear to auscultation. Respiratory effort normal. Cardiovascular system: S1 & S2 heard,  No murmurs  Gastrointestinal system: Abdomen soft, non-tender, nondistended. Normal bowel sound. No organomegaly Central nervous system: Alert and oriented. No focal neurological deficits. Extremities: No cyanosis, clubbing or edema Skin: No rashes or ulcers Psychiatry:  Mood & affect appropriate.     Data Reviewed: I have personally reviewed following labs and imaging studies  CBC:  Recent Labs Lab 07/16/17 0450 07/16/17 1350 07/17/17 0213 07/18/17 0359  WBC 7.2  --  5.9 5.0  HGB 7.4* 7.5* 7.4* 7.5*  HCT 22.1* 22.0* 22.2* 22.8*  MCV 92.5  --  88.8 89.8  PLT 227  --  130* 478   Basic Metabolic Panel:  Recent Labs Lab 07/16/17 0450 07/17/17 0213  NA 136 141  K 3.5 3.6  CL 104 112*  CO2 21* 23  GLUCOSE 173* 100*  BUN 57* 14  CREATININE 0.80 0.77  CALCIUM 8.7* 7.5*   GFR: Estimated Creatinine Clearance: 70 mL/min (by C-G formula based on SCr of 0.77 mg/dL). Liver Function Tests:  Recent Labs Lab 07/16/17 0450  AST 20  ALT 15  ALKPHOS 37*  BILITOT 0.6  PROT 5.1*  ALBUMIN 3.2*    Recent Labs Lab 07/16/17 0450  LIPASE 29   No results for input(s): AMMONIA in the last 168 hours. Coagulation Profile: No results for input(s): INR, PROTIME in the last 168 hours. Cardiac Enzymes: No results for input(s):  CKTOTAL, CKMB, CKMBINDEX, TROPONINI in the last 168 hours. BNP (last 3 results) No results for input(s): PROBNP in the last 8760 hours. HbA1C: No results for input(s): HGBA1C in the last 72 hours. CBG: No results for input(s): GLUCAP in the last 168 hours. Lipid Profile: No results for input(s): CHOL, HDL, LDLCALC, TRIG, CHOLHDL, LDLDIRECT in the last 72 hours. Thyroid Function Tests: No results for input(s): TSH, T4TOTAL, FREET4, T3FREE, THYROIDAB in the last 72 hours. Anemia Panel:  Recent Labs  07/17/17 1057 07/18/17 0359  VITAMINB12  --  317  FOLATE  --  21.5  FERRITIN 45  --   TIBC 344  --   IRON 120  --   RETICCTPCT 3.2*  --    Urine analysis:    Component Value Date/Time   LABSPEC 1.005 04/03/2015 1451   PHURINE 7.5 04/03/2015 1451   PHURINE 7.5 08/09/2007 2121   GLUCOSEU Negative 04/03/2015 1451   HGBUR Trace 04/03/2015 1451   HGBUR SMALL (A) 08/09/2007 2121   BILIRUBINUR neg 04/10/2017 0858   BILIRUBINUR Negative 04/03/2015 1451   KETONESUR Negative 04/03/2015 1451   [place] 08/09/2007 2121   PROTEINUR neg 04/10/2017 0858   PROTEINUR Negative 04/03/2015 1451   PROTEINUR NEGATIVE 08/09/2007 2121   UROBILINOGEN negative (A) 04/10/2017 0858   UROBILINOGEN 0.2 04/03/2015 1451   NITRITE neg 04/10/2017 0858   NITRITE Negative 04/03/2015 1451   NITRITE NEGATIVE 08/09/2007 2121   LEUKOCYTESUR Negative 04/10/2017 0858   LEUKOCYTESUR Small 04/03/2015 1451   Sepsis Labs: @LABRCNTIP (procalcitonin:4,lacticidven:4) )No results found for this or any previous visit (from the past 240 hour(s)).       Radiology Studies: No results found.    Scheduled Meds: . lamoTRIgine  100 mg Oral Daily   Continuous Infusions: . pantoprozole (PROTONIX) infusion 8 mg/hr (07/18/17 0742)     LOS: 2 days    Time spent  in minutes: 32 min    Debbe Odea, MD [facility] Pager: [email or web address] Password [email or web address] 07/18/2017, 2:29 PM

## 2017-07-19 MED ORDER — CYANOCOBALAMIN 1000 MCG/ML IJ SOLN
1000.0000 ug | Freq: Once | INTRAMUSCULAR | Status: AC
  Administered 2017-07-19: 1000 ug via SUBCUTANEOUS
  Filled 2017-07-19: qty 1

## 2017-07-19 MED ORDER — FERROUS SULFATE 325 (65 FE) MG PO TABS: 325.0000 mg | ORAL_TABLET | Freq: Three times a day (TID) | ORAL | 3 refills | Status: DC

## 2017-07-19 MED ORDER — PANTOPRAZOLE SODIUM 40 MG PO TBEC: 40.0000 mg | DELAYED_RELEASE_TABLET | Freq: Two times a day (BID) | ORAL | 1 refills | Status: DC

## 2017-07-19 NOTE — Discharge Summary (Signed)
Physician Discharge Summary  Rhonda Gardner NID:[id number] DOB: 1956/08/10 DOA: 07/16/2017  PCP: Rita Ohara, MD  Admit date: 07/16/2017 Discharge date: 08/01/2017  Admitted From: home Disposition:  home   Recommendations for Outpatient Follow-up:  1. Dr Fuller Plan recommends she f/u with her prior GI but she is considering switching her care to Dr Fuller Plan 2. Follow Hb closely and ensure it is rising appropriately     Discharge Condition:  stable   CODE STATUS:  Full code   Consultations:  GI    Discharge Diagnoses:  Principal Problem:   Duodenal ulcer with hemorrhage Active Problems:   Symptomatic anemia   Bipolar 1 disorder (HCC)   Tachycardia   Hypotension due to hypovolemia   Melena    Subjective: No complaints of nausea, vomiting, diarrhea, abdominal pain. No other complaints.   Brief Summary: Rhonda Gardner is a 61 y.o.femalewith medical history significant of breast cancer, bipolar disorder, osteoporosis who presented to the hospital for dark stools, bloody vomitus and sudden onset, dizziness of feeling lightheaded and having a racing heart on standing. She had been taking NSAIDs for a few days after a dental procedure.   Hospital Course:   Upper GI bleed   Duodenal ulcer with hemorrhage - EGD 10/3 showed an oozing duodenal ulcer - see report below - continued on Protonix infusion until Sat per GI- subsequently Protonix BID for 2 wks and then daily 'long term' - H pylori ig G antibody < 0.80 - discussed avoidance of NSAIDs  Symptomatic anemia - given 2 U PRBC on 10/3 - Hb did not rise but has stayed the same around 7 - followed for symptoms and decided no further transfusion needed - anemia panel showed low normal Ferretin level and normal TIBC- oral iron started - B12 low normal as well- given 2 s/c injections of 1000 mcg each in hospital   Thrombocytopenia  - acute- may be consumption due to bleeding- has normalized    Bipolar 1 disorder  (HCC) - Lamictal    Hypotension due to hypovolemia - follow as outpt   DVT prophylaxis: SCDs Code Status: Full code Family Communication:  Disposition Plan: home on Saturday  Consultants:   GI Procedures:   EGD 10/3 Impression: - Normal esophagus. - Small hiatal hernia. - One oozing duodenal ulcer with a visible vessel.  Treatment with epinephrine not successful.  Treatment with Bicap was successful. - No specimens collected.  Discharge Instructions  Discharge Instructions    Diet - low sodium heart healthy    Complete by:  As directed    Avoid caffeine, spicy, acidic and oily food. Avoid any other food if it causes heart burn/ indigestion.   Increase activity slowly    Complete by:  As directed      Allergies as of 07/19/2017      Reactions   Adhesive [tape] Rash   Pt is sensitive to any kind of adhesive tape products.      Medication List    STOP taking these medications   naproxen sodium 220 MG tablet Commonly known as:  ANAPROX     TAKE these medications   ferrous sulfate 325 (65 FE) MG tablet Take 1 tablet (325 mg total) by mouth 3 (three) times daily with meals.   fluticasone 50 MCG/ACT nasal spray Commonly known as:  FLONASE Place 2 sprays into both nostrils daily.   LAMICTAL 200 MG tablet Generic drug:  lamoTRIgine Take 0.5 tablets (100 mg total) by mouth daily.   LUTEIN-ZEAXANTHIN  PO Take 1 tablet by mouth daily.   magnesium 30 MG tablet Take 30 mg by mouth daily.   pantoprazole 40 MG tablet Commonly known as:  PROTONIX Take 1 tablet (40 mg total) by mouth 2 (two) times daily before a meal.   phenylephrine 10 MG Tabs tablet Commonly known as:  SUDAFED PE Take 10-30 mg by mouth every 4 (four) hours as needed (congestion).   PRESERVISION AREDS PO Take 2 capsules by mouth daily.    promethazine 25 MG tablet Commonly known as:  PHENERGAN Take 25 mg by mouth every 6 (six) hours as needed for nausea or vomiting.   saccharomyces boulardii 250 MG capsule Commonly known as:  FLORASTOR Take 250 mg by mouth 2 (two) times daily.   Vitamin D 2000 units tablet Take 2,000 Units by mouth daily.      Follow-up Information    Berenda Morale, MD. Schedule an appointment as soon as possible for a visit in 3 week(s).   Specialty:  Gastroenterology Contact information: [place] [address] [phone]        Ladene Artist, MD Follow up.   Specialty:  Gastroenterology Contact information: [address] [place] [address] [phone]        Rita Ohara, MD Follow up in 2 week(s).   Specialty:  Family Medicine Contact information: [address] [place] [address] [phone]          Allergies  Allergen Reactions  . Adhesive [Tape] Rash    Pt is sensitive to any kind of adhesive tape products.     Procedures/Studies:    Dg Chest 2 View  Result Date: 07/16/2017 CLINICAL DATA:  Weakness, shortness of breath, tightness in the chest for 3 days. EXAM: CHEST  2 VIEW COMPARISON:  None. FINDINGS: Mild hyperinflation. Normal heart size and pulmonary vascularity. No focal airspace disease or consolidation in the lungs. No blunting of costophrenic angles. No pneumothorax. Mediastinal contours appear intact. Surgical clips in the left axilla. IMPRESSION: No active cardiopulmonary disease. Electronically Signed   By: Lucienne Capers M.D.   On: 07/16/2017 05:42       Discharge Exam: Vitals:   07/18/17 2126 07/19/17 0929  BP: (!) 106/52 120/76  Pulse: 88 89  Resp: 17 18  Temp: 98.6 F (37 C) 98 F (36.7 C)  SpO2: 100% 100%   Vitals:   07/18/17 0745 07/18/17 1801 07/18/17 2126 07/19/17 0929  BP: (!) 105/49 (!) 97/51 (!) 106/52 120/76  Pulse: 77 78 88 89  Resp: 16 16 17 18   Temp: 99 F (37.2 C) 99 F  (37.2 C) 98.6 F (37 C) 98 F (36.7 C)  TempSrc: Oral Oral Oral Oral  SpO2: 100% 100% 100% 100%  Weight:      Height:        General: Pt is alert, awake, not in acute distress Cardiovascular: RRR, S1/S2 +, no rubs, no gallops Respiratory: CTA bilaterally, no wheezing, no rhonchi Abdominal: Soft, NT, ND, bowel sounds + Extremities: no edema, no cyanosis    The results of significant diagnostics from this hospitalization (including imaging, microbiology, ancillary and laboratory) are listed below for reference.     Microbiology: No results found for this or any previous visit (from the past 240 hour(s)).   Labs: Results for DESHONDRA, WORST (MRN [id number]) as of 08/01/2017 16:42  Ref. Range 07/17/2017 10:57 07/18/2017 03:59  Iron Latest Ref Range: 28 - 170 ug/dL 120   UIBC Latest  Units: ug/dL 224   TIBC Latest Ref Range: 250 - 450 ug/dL 344   Saturation Ratios Latest Ref Range: 10.4 - 31.8 % 35 (H)   Ferritin Latest Ref Range: 11 - 307 ng/mL 45   Folate Latest Ref Range: >5.9 ng/mL  21.5   Results for SIMA, LINDENBERGER (MRN [id number]) as of 08/01/2017 16:42  Ref. Range 07/18/2017 03:59  Vitamin B12 Latest Ref Range: 180 - 914 pg/mL 317   BNP (last 3 results) No results for input(s): BNP in the last 8760 hours. Basic Metabolic Panel: No results for input(s): NA, K, CL, CO2, GLUCOSE, BUN, CREATININE, CALCIUM, MG, PHOS in the last 168 hours. Liver Function Tests: No results for input(s): AST, ALT, ALKPHOS, BILITOT, PROT, ALBUMIN in the last 168 hours. No results for input(s): LIPASE, AMYLASE in the last 168 hours. No results for input(s): AMMONIA in the last 168 hours. CBC:  Recent Labs Lab 07/29/17 1153  WBC 5.3  NEUTROABS 4,335  HGB 9.6*  HCT 29.7*  MCV 92.8  PLT 306   Cardiac Enzymes: No results for input(s): CKTOTAL, CKMB, CKMBINDEX, TROPONINI in the last 168 hours. BNP: Invalid input(s): POCBNP CBG: No results for input(s): GLUCAP in the last  168 hours. D-Dimer No results for input(s): DDIMER in the last 72 hours. Hgb A1c No results for input(s): HGBA1C in the last 72 hours. Lipid Profile No results for input(s): CHOL, HDL, LDLCALC, TRIG, CHOLHDL, LDLDIRECT in the last 72 hours. Thyroid function studies No results for input(s): TSH, T4TOTAL, T3FREE, THYROIDAB in the last 72 hours.  Invalid input(s): FREET3 Anemia work up No results for input(s): VITAMINB12, FOLATE, FERRITIN, TIBC, IRON, RETICCTPCT in the last 72 hours. Urinalysis    Component Value Date/Time   LABSPEC 1.005 04/03/2015 1451   PHURINE 7.5 04/03/2015 1451   PHURINE 7.5 08/09/2007 2121   GLUCOSEU Negative 04/03/2015 1451   HGBUR Trace 04/03/2015 1451   HGBUR SMALL (A) 08/09/2007 2121   BILIRUBINUR neg 04/10/2017 0858   BILIRUBINUR Negative 04/03/2015 1451   KETONESUR Negative 04/03/2015 1451   [place] 08/09/2007 2121   PROTEINUR neg 04/10/2017 0858   PROTEINUR Negative 04/03/2015 1451   PROTEINUR NEGATIVE 08/09/2007 2121   UROBILINOGEN negative (A) 04/10/2017 0858   UROBILINOGEN 0.2 04/03/2015 1451   NITRITE neg 04/10/2017 0858   NITRITE Negative 04/03/2015 1451   NITRITE NEGATIVE 08/09/2007 2121   LEUKOCYTESUR Negative 04/10/2017 0858   LEUKOCYTESUR Small 04/03/2015 1451   Sepsis Labs Invalid input(s): PROCALCITONIN,  WBC,  LACTICIDVEN Microbiology No results found for this or any previous visit (from the past 240 hour(s)).   Time coordinating discharge: Over 30 minutes  SIGNED:   Debbe Odea, MD  [facility] 08/01/2017, 4:43 PM Pager   If 7PM-7AM, please contact night-coverage [email or web address] Password [email or web address]

## 2017-07-19 NOTE — Progress Notes (Signed)
Rhonda Gardner to be D/C'd Home per MD order.  Discussed prescriptions and follow up appointments with the patient. Prescriptions given to patient, medication list explained in detail. Pt verbalized understanding.  Allergies as of 07/19/2017      Reactions   Adhesive [tape] Rash   Pt is sensitive to any kind of adhesive tape products.      Medication List    STOP taking these medications   naproxen sodium 220 MG tablet Commonly known as:  ANAPROX     TAKE these medications   ferrous sulfate 325 (65 FE) MG tablet Take 1 tablet (325 mg total) by mouth 3 (three) times daily with meals.   fluticasone 50 MCG/ACT nasal spray Commonly known as:  FLONASE Place 2 sprays into both nostrils daily.   LAMICTAL 200 MG tablet Generic drug:  lamoTRIgine Take 0.5 tablets (100 mg total) by mouth daily.   LUTEIN-ZEAXANTHIN PO Take 1 tablet by mouth daily.   magnesium 30 MG tablet Take 30 mg by mouth daily.   pantoprazole 40 MG tablet Commonly known as:  PROTONIX Take 1 tablet (40 mg total) by mouth 2 (two) times daily before a meal.   phenylephrine 10 MG Tabs tablet Commonly known as:  SUDAFED PE Take 10-30 mg by mouth every 4 (four) hours as needed (congestion).   PRESERVISION AREDS PO Take 2 capsules by mouth daily.   promethazine 25 MG tablet Commonly known as:  PHENERGAN Take 25 mg by mouth every 6 (six) hours as needed for nausea or vomiting.   saccharomyces boulardii 250 MG capsule Commonly known as:  FLORASTOR Take 250 mg by mouth 2 (two) times daily.   Vitamin D 2000 units tablet Take 2,000 Units by mouth daily.       Vitals:   07/18/17 2126 07/19/17 0929  BP: (!) 106/52 120/76  Pulse: 88 89  Resp: 17 18  Temp: 98.6 F (37 C) 98 F (36.7 C)  SpO2: 100% 100%    Skin clean, dry and intact without evidence of skin break down, no evidence of skin tears noted. IV catheter discontinued intact. Site without signs and symptoms of complications. Dressing and  pressure applied. Pt denies pain at this time. No complaints noted.  An After Visit Summary was printed and given to the patient. Patient escorted via [place], and D/C home via private auto.  Emilio Math, RN [facility] Phone [phone]

## 2017-07-21 ENCOUNTER — Telehealth: Payer: Self-pay | Admitting: Family Medicine

## 2017-07-21 DIAGNOSIS — K260 Acute duodenal ulcer with hemorrhage: Secondary | ICD-10-CM

## 2017-07-21 NOTE — Telephone Encounter (Signed)
Their discharge summary hasn't been completed yet, so I don't know WHEN they recommended to have her CBC rechecked. I put in future order

## 2017-07-21 NOTE — Telephone Encounter (Signed)
Pt was called for hospital follow up. She made an appt for 08/04/2017. She would like to have labs drawn prior to this appt. She states she needs her Hemoglobin level check prior to seeing you. Please advise pt. At  [phone].

## 2017-07-22 ENCOUNTER — Encounter: Payer: Self-pay | Admitting: Gastroenterology

## 2017-07-29 ENCOUNTER — Other Ambulatory Visit: Payer: BLUE CROSS/BLUE SHIELD

## 2017-07-29 DIAGNOSIS — K260 Acute duodenal ulcer with hemorrhage: Secondary | ICD-10-CM

## 2017-07-29 LAB — CBC WITH DIFFERENTIAL/PLATELET
BASOS ABS: 32 {cells}/uL (ref 0–200)
Basophils Relative: 0.6 %
EOS ABS: 58 {cells}/uL (ref 15–500)
Eosinophils Relative: 1.1 %
HEMATOCRIT: 29.7 % — AB (ref 35.0–45.0)
Hemoglobin: 9.6 g/dL — ABNORMAL LOW (ref 11.7–15.5)
LYMPHS ABS: 525 {cells}/uL — AB (ref 850–3900)
MCH: 30 pg (ref 27.0–33.0)
MCHC: 32.3 g/dL (ref 32.0–36.0)
MCV: 92.8 fL (ref 80.0–100.0)
MPV: 10 fL (ref 7.5–12.5)
Monocytes Relative: 6.6 %
NEUTROS PCT: 81.8 %
Neutro Abs: 4335 cells/uL (ref 1500–7800)
Platelets: 306 10*3/uL (ref 140–400)
RBC: 3.2 10*6/uL — ABNORMAL LOW (ref 3.80–5.10)
RDW: 14.7 % (ref 11.0–15.0)
Total Lymphocyte: 9.9 %
WBC: 5.3 10*3/uL (ref 3.8–10.8)
WBCMIX: 350 {cells}/uL (ref 200–950)

## 2017-08-03 NOTE — Progress Notes (Signed)
Chief Complaint  Patient presents with  . Hospitalization Follow-up    follow up-feeling better, still gets tired and washed out.    Patient presents for hospital follow-up.  She was admitted 10/3 after presenting with dark stools, bloody vomitus, dizziness, racing heart with standing and shortness of breath with any exertion. She had been taking NSAIDs for a few days after a dental procedure.  She had EGD on 10/3 which showed an oozing duodenal ulcer.  Hgb was 7.4.  She was treated with Protonix infusion, and transfused 2 U PRBC's.  She was discharged on Protonix BID x 2 weeks, then to take it once daily, long-term.  Also discharged on oral iron supplement--she is taking 2/day (cut back from 3/day; no constipation). She is eating high iron foods, including liver. H. Pylori antibody was negative.  She was advised to avoid NSAIDs.  F/u CBC prior to discharge didn't show rise, but she symptomatically was improved.   CBC on 10/5: WBC 4.0 - 10.5 K/uL 5.0   RBC 3.87 - 5.11 MIL/uL 2.54    Hemoglobin 12.0 - 15.0 g/dL 7.5    HCT 36.0 - 46.0 % 22.8    MCV 78.0 - 100.0 fL 89.8   MCH 26.0 - 34.0 pg 29.5   MCHC 30.0 - 36.0 g/dL 32.9   RDW 11.5 - 15.5 % 16.6    Platelets 150 - 400 K/uL 164    She was also found to have low-normal B12, and was treated with 1075mcg injections x 2.  Lab Results  Component Value Date   [id number] 07/18/2017   She had CBC repeated last week: Lab Results  Component Value Date   WBC 5.3 07/29/2017   HGB 9.6 (L) 07/29/2017   HCT 29.7 (L) 07/29/2017   MCV 92.8 07/29/2017   PLT 306 07/29/2017   Denies any black/tarry stools, maroon or bloody stools, no further vomiting. Denies any abdominal pain. She feels much better. Bowels have been a little loose the last 2 days (thinks she ate something at a Halloween party the other night). She took an Imodium this morning.  She has had some indigestion, even while on Protonix twice daily, thinks maybe she didn't take it prior  to the meal. For a while she was having some indigestion daily, but not in the last couple of days.  She decreased to once daily protonix about 3 days ago. Her GI follow-up is scheduled for December  [facility], [facility], [facility] reviewed  Outpatient Encounter Prescriptions as of 08/04/2017  Medication Sig Note  . Cholecalciferol (VITAMIN D) 2000 UNITS tablet Take 2,000 Units by mouth daily.   . ferrous sulfate 325 (65 FE) MG tablet Take 1 tablet (325 mg total) by mouth 3 (three) times daily with meals. 08/04/2017: Taking BID  . fluticasone (FLONASE) 50 MCG/ACT nasal spray Place 2 sprays into both nostrils daily.   Marland Kitchen LAMICTAL 200 MG tablet Take 0.5 tablets (100 mg total) by mouth daily.   . LUTEIN-ZEAXANTHIN PO Take 1 tablet by mouth daily.   . magnesium 30 MG tablet Take 30 mg by mouth daily.   . Multiple Vitamins-Minerals (PRESERVISION AREDS PO) Take 2 capsules by mouth daily.   . pantoprazole (PROTONIX) 40 MG tablet Take 1 tablet (40 mg total) by mouth 2 (two) times daily before a meal. 08/04/2017: Once daily  . Zoledronic Acid (ZOMETA IV) Inject into the vein.   . [DISCONTINUED] saccharomyces boulardii (FLORASTOR) 250 MG capsule Take 250 mg by mouth 2 (two) times daily.   Marland Kitchen  phenylephrine (SUDAFED PE) 10 MG TABS tablet Take 10-30 mg by mouth every 4 (four) hours as needed (congestion).   . promethazine (PHENERGAN) 25 MG tablet Take 25 mg by mouth every 6 (six) hours as needed for nausea or vomiting.    No facility-administered encounter medications on file as of 08/04/2017.    Allergies  Allergen Reactions  . Adhesive [Tape] Rash    Pt is sensitive to any kind of adhesive tape products.   ROS: no fever, chills, URI symptoms, headaches, dizziness, dyspnea, chest pain, nausea, vomiting.  +loose stools x 2d. No bleeding, bruising, rash, moods are good, no other concerns.   PHYSICAL EXAM:  BP 140/80   Pulse 80   Ht 5' 6.5" (1.689 m)   Wt 150 lb 3.2 oz (68.1 kg)   BMI 23.88 kg/m   120/70 on  repeat by MD, RA Well appearing, pleasant female, in good spirits HEENT: conjunctiva and sclera are clear, OP clear, moist mucus membranes Neck: no lymphadenopathy or mass Heart: regular rate and rhythm, no murmur Lungs: clear bilaterally Abdomen: soft, nontender, no organomegaly or mass, normal bowel sounds Extremities: no edema, normal pulses Psych: normal mood, affect, hygiene and grooming Neuro: alert and oriented, cranial nerves intact, normal gait  ASSESSMENT/PLAN:  Duodenal ulcer with hemorrhage - s/p 2 U pRBC transfusion in hospital.  No further bleeding, asymptomatic. Cont Protonix daily, iron. recheck CBC 2wks; f/u with GI - Plan: CBC with Differential/Platelet  Hiatal hernia - mild, noted on EGD. Discussed dx, sx. May need BID protonix if not improved with dietary changes  Iron deficiency anemia, unspecified iron deficiency anemia type - Continue iron and recheck CBC to ensure anemia continues to improve - Plan: CBC with Differential/Platelet  Low vitamin B12 level - borderline; tx'd with injections x 2 in hosp. No e/o significant B12 deficiency per routine CBC's done for onc. Doubt long-term supplementation will be needed  Counseled re: diet for reflux, dx of hiatal hernia. Reviewed studies and results from hospital, all questions answered. She was frustrated about wait for GI--advised if there is any concern, we can get her in sooner.  If CBC improves, and not further problems, okay to wait.  Doubt B12 supplementation will be needed long-term based on prior normal CBC's (q6 mos), unless related to PPI use (which will be needed, not previously used).  Recheck in future (6 mos or at CPE)  longterm potential risks of PPI reviewed, supplements reviewed.   CBC next week

## 2017-08-04 ENCOUNTER — Encounter: Payer: Self-pay | Admitting: Family Medicine

## 2017-08-04 ENCOUNTER — Ambulatory Visit (INDEPENDENT_AMBULATORY_CARE_PROVIDER_SITE_OTHER): Payer: BLUE CROSS/BLUE SHIELD | Admitting: Family Medicine

## 2017-08-04 VITALS — BP 120/70 | HR 80 | Ht 66.5 in | Wt 150.2 lb

## 2017-08-04 DIAGNOSIS — E538 Deficiency of other specified B group vitamins: Secondary | ICD-10-CM | POA: Diagnosis not present

## 2017-08-04 DIAGNOSIS — R7989 Other specified abnormal findings of blood chemistry: Secondary | ICD-10-CM

## 2017-08-04 DIAGNOSIS — K449 Diaphragmatic hernia without obstruction or gangrene: Secondary | ICD-10-CM

## 2017-08-04 DIAGNOSIS — K264 Chronic or unspecified duodenal ulcer with hemorrhage: Secondary | ICD-10-CM

## 2017-08-04 DIAGNOSIS — D509 Iron deficiency anemia, unspecified: Secondary | ICD-10-CM

## 2017-08-04 NOTE — Patient Instructions (Addendum)
Continue your iron--three times daily if tolerating, is recommended. This will help get your hemoglobin back up to the normal range faster. Continue high iron foods.  If you develop significant constipation, you may decrease to twice daily and take a stool softener along with it, if needed.    Continue the Protonix once daily.  If you have continued issues with heartburn/reflux, you can increase it back to twice daily until you see the gastroenterologist.  See info below to help with some dietary measures to reduce reflux.  Your B12 level was borderline low. You were treated in the hospital with 2 shots. I doubt this is a chronic problem, given that your blood counts in the past didn't suggest low B12 (with any anemia or large blood cells).  Consider taking a multivitamin or Bcomplex or a separate B12 vitamin daily (1031mcg).   Food Choices for Gastroesophageal Reflux Disease, Adult When you have gastroesophageal reflux disease (GERD), the foods you eat and your eating habits are very important. Choosing the right foods can help ease the discomfort of GERD. Consider working with a diet and nutrition specialist (dietitian) to help you make healthy food choices. What general guidelines should I follow? Eating plan  Choose healthy foods low in fat, such as fruits, vegetables, whole grains, low-fat dairy products, and lean meat, fish, and poultry.  Eat frequent, small meals instead of three large meals each day. Eat your meals slowly, in a relaxed setting. Avoid bending over or lying down until 2-3 hours after eating.  Limit high-fat foods such as fatty meats or fried foods.  Limit your intake of oils, butter, and shortening to less than 8 teaspoons each day.  Avoid the following: ? Foods that cause symptoms. These may be different for different people. Keep a food diary to keep track of foods that cause symptoms. ? Alcohol. ? Drinking large amounts of liquid with meals. ? Eating meals during the  2-3 hours before bed.  Cook foods using methods other than frying. This may include baking, grilling, or broiling. Lifestyle   Maintain a healthy weight. Ask your health care provider what weight is healthy for you. If you need to lose weight, work with your health care provider to do so safely.  Exercise for at least 30 minutes on 5 or more days each week, or as told by your health care provider.  Avoid wearing clothes that fit tightly around your waist and chest.  Do not use any products that contain nicotine or tobacco, such as cigarettes and e-cigarettes. If you need help quitting, ask your health care provider.  Sleep with the head of your bed raised. Use a wedge under the mattress or blocks under the bed frame to raise the head of the bed. What foods are not recommended? The items listed may not be a complete list. Talk with your dietitian about what dietary choices are best for you. Grains Pastries or quick breads with added fat. [place] toast. Vegetables Deep fried vegetables. [place] fries. Any vegetables prepared with added fat. Any vegetables that cause symptoms. For some people this may include tomatoes and tomato products, chili peppers, onions and garlic, and horseradish. Fruits Any fruits prepared with added fat. Any fruits that cause symptoms. For some people this may include citrus fruits, such as oranges, grapefruit, pineapple, and lemons. Meats and other protein foods High-fat meats, such as fatty beef or pork, hot dogs, ribs, ham, sausage, salami and bacon. Fried meat or protein, including fried fish and fried  chicken. Nuts and nut butters. Dairy Whole milk and chocolate milk. Sour cream. Cream. Ice cream. Cream cheese. Milk shakes. Beverages Coffee and tea, with or without caffeine. Carbonated beverages. Sodas. Energy drinks. Fruit juice made with acidic fruits (such as orange or grapefruit). Tomato juice. Alcoholic drinks. Fats and oils Butter. Margarine. Shortening.  Ghee. Sweets and desserts Chocolate and cocoa. Donuts. Seasoning and other foods Pepper. Peppermint and spearmint. Any condiments, herbs, or seasonings that cause symptoms. For some people, this may include curry, hot sauce, or vinegar-based salad dressings. Summary  When you have gastroesophageal reflux disease (GERD), food and lifestyle choices are very important to help ease the discomfort of GERD.  Eat frequent, small meals instead of three large meals each day. Eat your meals slowly, in a relaxed setting. Avoid bending over or lying down until 2-3 hours after eating.  Limit high-fat foods such as fatty meat or fried foods. This information is not intended to replace advice given to you by your health care provider. Make sure you discuss any questions you have with your health care provider. Document Released: 09/30/2005 Document Revised: 10/01/2016 Document Reviewed: 10/01/2016 Elsevier Interactive Patient Education  2017 [employer]   Hiatal Hernia A hiatal hernia occurs when part of the stomach slides above the muscle that separates the abdomen from the chest (diaphragm). A person can be born with a hiatal hernia (congenital), or it may develop over time. In almost all cases of hiatal hernia, only the top part of the stomach pushes through the diaphragm. Many people have a hiatal hernia with no symptoms. The larger the hernia, the more likely it is that you will have symptoms. In some cases, a hiatal hernia allows stomach acid to flow back into the tube that carries food from your mouth to your stomach (esophagus). This may cause heartburn symptoms. Severe heartburn symptoms may mean that you have developed a condition called gastroesophageal reflux disease (GERD). What are the causes? This condition is caused by a weakness in the opening (hiatus) where the esophagus passes through the diaphragm to attach to the upper part of the stomach. A person may be born with a weakness in the  hiatus, or a weakness can develop over time. What increases the risk? This condition is more likely to develop in:  Older people. Age is a major risk factor for a hiatal hernia, especially if you are over the age of 73.  Pregnant women.  People who are overweight.  People who have frequent constipation.  What are the signs or symptoms? Symptoms of this condition usually develop in the form of GERD symptoms. Symptoms include:  Heartburn.  Belching.  Indigestion.  Trouble swallowing.  Coughing or wheezing.  Sore throat.  Hoarseness.  Chest pain.  Nausea and vomiting.  How is this diagnosed? This condition may be diagnosed during testing for GERD. Tests that may be done include:  X-rays of your stomach or chest.  An upper gastrointestinal (GI) series. This is an X-ray exam of your GI tract that is taken after you swallow a chalky liquid that shows up clearly on the X-ray.  Endoscopy. This is a procedure to look into your stomach using a thin, flexible tube that has a tiny camera and light on the end of it.  How is this treated? This condition may be treated by:  Dietary and lifestyle changes to help reduce GERD symptoms.  Medicines. These may include: ? Over-the-counter antacids. ? Medicines that make your stomach empty more quickly. ?  Medicines that block the production of stomach acid (H2 blockers). ? Stronger medicines to reduce stomach acid (proton pump inhibitors).  Surgery to repair the hernia, if other treatments are not helping.  If you have no symptoms, you may not need treatment. Follow these instructions at home: Lifestyle and activity  Do not use any products that contain nicotine or tobacco, such as cigarettes and e-cigarettes. If you need help quitting, ask your health care provider.  Try to achieve and maintain a healthy body weight.  Avoid putting pressure on your abdomen. Anything that puts pressure on your abdomen increases the amount of  acid that may be pushed up into your esophagus. ? Avoid bending over, especially after eating. ? Raise the head of your bed by putting blocks under the legs. This keeps your head and esophagus higher than your stomach. ? Do not wear tight clothing around your chest or stomach. ? Try not to strain when having a bowel movement, when urinating, or when lifting heavy objects. Eating and drinking  Avoid foods that can worsen GERD symptoms. These may include: ? Fatty foods, like fried foods. ? Citrus fruits, like oranges or lemon. ? Other foods and drinks that contain acid, like orange juice or tomatoes. ? Spicy food. ? Chocolate.  Eat frequent small meals instead of three large meals a day. This helps prevent your stomach from getting too full. ? Eat slowly. ? Do not lie down right after eating. ? Do not eat 1-2 hours before bed.  Do not drink beverages with caffeine. These include cola, coffee, cocoa, and tea.  Do not drink alcohol. General instructions  Take over-the-counter and prescription medicines only as told by your health care provider.  Keep all follow-up visits as told by your health care provider. This is important. Contact a health care provider if:  Your symptoms are not controlled with medicines or lifestyle changes.  You are having trouble swallowing.  You have coughing or wheezing that will not go away. Get help right away if:  Your pain is getting worse.  Your pain spreads to your arms, neck, jaw, teeth, or back.  You have shortness of breath.  You sweat for no reason.  You feel sick to your stomach (nauseous) or you vomit.  You vomit blood.  You have bright red blood in your stools.  You have black, tarry stools. This information is not intended to replace advice given to you by your health care provider. Make sure you discuss any questions you have with your health care provider. Document Released: 12/21/2003 Document Revised: 09/23/2016 Document  Reviewed: 09/23/2016 Elsevier Interactive Patient Education  [employer].

## 2017-08-06 ENCOUNTER — Telehealth: Payer: Self-pay | Admitting: *Deleted

## 2017-08-06 NOTE — Telephone Encounter (Signed)
Patient called and really wants to get in with Dr.Stark ASAP. She is still having trouble with diarrhea. The smell is odd, bad. Not black and tarry stools but she is really struggling with this diarrhea. [name] is going to get on OTC fecal occult test today. She thinks has been dragging on too long and is really worried. Please advise her on what to do. Thanks.

## 2017-08-06 NOTE — Telephone Encounter (Signed)
Patient advised of new GI appt and will call me tomorrow if she wants to come in for the labs.

## 2017-08-06 NOTE — Telephone Encounter (Signed)
Please call GI and see if she can get sooner appointment.  Okay if needs to be with NP if that's all they can offer.  Also, she can return for her CBC this week, doesn't need to wait (future order in system).  Also add on a b-met to look for electrolyte abnormalities related to her diarrhea which can contribute to fatigue.

## 2017-08-07 ENCOUNTER — Other Ambulatory Visit: Payer: Self-pay | Admitting: *Deleted

## 2017-08-07 ENCOUNTER — Other Ambulatory Visit: Payer: Self-pay

## 2017-08-07 ENCOUNTER — Ambulatory Visit (INDEPENDENT_AMBULATORY_CARE_PROVIDER_SITE_OTHER): Payer: BLUE CROSS/BLUE SHIELD | Admitting: Family Medicine

## 2017-08-07 ENCOUNTER — Encounter: Payer: Self-pay | Admitting: Family Medicine

## 2017-08-07 VITALS — BP 120/76 | HR 76 | Ht 66.5 in | Wt 149.2 lb

## 2017-08-07 DIAGNOSIS — R5383 Other fatigue: Secondary | ICD-10-CM

## 2017-08-07 DIAGNOSIS — D509 Iron deficiency anemia, unspecified: Secondary | ICD-10-CM | POA: Diagnosis not present

## 2017-08-07 DIAGNOSIS — R197 Diarrhea, unspecified: Secondary | ICD-10-CM

## 2017-08-07 DIAGNOSIS — K264 Chronic or unspecified duodenal ulcer with hemorrhage: Secondary | ICD-10-CM

## 2017-08-07 LAB — CBC WITH DIFFERENTIAL/PLATELET
BASOS PCT: 0.5 %
Basophils Absolute: 40 cells/uL (ref 0–200)
EOS PCT: 1.5 %
Eosinophils Absolute: 120 cells/uL (ref 15–500)
HCT: 33.6 % — ABNORMAL LOW (ref 35.0–45.0)
Hemoglobin: 11.3 g/dL — ABNORMAL LOW (ref 11.7–15.5)
Lymphs Abs: 584 cells/uL — ABNORMAL LOW (ref 850–3900)
MCH: 30.9 pg (ref 27.0–33.0)
MCHC: 33.6 g/dL (ref 32.0–36.0)
MCV: 91.8 fL (ref 80.0–100.0)
MONOS PCT: 8.7 %
MPV: 9.8 fL (ref 7.5–12.5)
NEUTROS PCT: 82 %
Neutro Abs: 6560 cells/uL (ref 1500–7800)
PLATELETS: 251 10*3/uL (ref 140–400)
RBC: 3.66 10*6/uL — AB (ref 3.80–5.10)
RDW: 14 % (ref 11.0–15.0)
TOTAL LYMPHOCYTE: 7.3 %
WBC mixed population: 696 cells/uL (ref 200–950)
WBC: 8 10*3/uL (ref 3.8–10.8)

## 2017-08-07 LAB — COMPREHENSIVE METABOLIC PANEL
AG Ratio: 1.8 (calc) (ref 1.0–2.5)
ALBUMIN MSPROF: 4.2 g/dL (ref 3.6–5.1)
ALT: 11 U/L (ref 6–29)
AST: 13 U/L (ref 10–35)
Alkaline phosphatase (APISO): 57 U/L (ref 33–130)
BUN: 9 mg/dL (ref 7–25)
CHLORIDE: 101 mmol/L (ref 98–110)
CO2: 27 mmol/L (ref 20–32)
CREATININE: 0.86 mg/dL (ref 0.50–0.99)
Calcium: 8.8 mg/dL (ref 8.6–10.4)
GLOBULIN: 2.4 g/dL (ref 1.9–3.7)
GLUCOSE: 99 mg/dL (ref 65–99)
POTASSIUM: 3.7 mmol/L (ref 3.5–5.3)
Sodium: 136 mmol/L (ref 135–146)
Total Bilirubin: 0.5 mg/dL (ref 0.2–1.2)
Total Protein: 6.6 g/dL (ref 6.1–8.1)

## 2017-08-07 NOTE — Progress Notes (Signed)
Chief Complaint  Patient presents with  . Rectal Bleeding    having abdominal cramping, diarrhea, nausea.   . Shingrix    decided that she would like to get the shot but in a few weeks-is it okay to hold?   She has been feeling worse, diarrhea got worse since her visit on Monday, thought she hasn't had much since last night.  Diarrhea started on Sunday.  Became frequent and crampy.  Decreased since she hasn't been eating much.  Even water caused cramping and diarrhea.  Only small amount this morning.  She has had some nausea.  No vomiting. She has some abdominal discomfort/tenderness diffusely, only cramps prior to diarrhea.  She has seen some clear mucus in the stool.  Stool has also been yellow-orange in color. Initially was a mixture of liquid and soft, now liquid. Light brown, watery, mucusy with dark flecks. No blood/maroon. Stopped taking iron yesterday. Didn't see the dark flecks in the last stool. Home fecal occult blood test was faintly + on the first 2, negative on the third stool (stool was very orange/light, didn't have the dark flecks in it).  Staying well hydrated. Had milk yesterday and last night.  "I feel bad enough to go to the hospital if I need to."  Energy is sapped, not getting out of bed, feels weak.  Husband is very worried.   PMH, [place], SH reviewed  Outpatient Encounter Prescriptions as of 08/07/2017  Medication Sig Note  . Cholecalciferol (VITAMIN D) 2000 UNITS tablet Take 2,000 Units by mouth daily.   . fluticasone (FLONASE) 50 MCG/ACT nasal spray Place 2 sprays into both nostrils daily.   Marland Kitchen LAMICTAL 200 MG tablet Take 0.5 tablets (100 mg total) by mouth daily.   . Zoledronic Acid (ZOMETA IV) Inject into the vein.   . ferrous sulfate 325 (65 FE) MG tablet Take 1 tablet (325 mg total) by mouth 3 (three) times daily with meals. (Patient not taking: Reported on 08/07/2017) 08/04/2017: Taking BID  . LUTEIN-ZEAXANTHIN PO Take 1 tablet by mouth daily.   .  magnesium 30 MG tablet Take 30 mg by mouth daily.   . Multiple Vitamins-Minerals (PRESERVISION AREDS PO) Take 2 capsules by mouth daily.   . pantoprazole (PROTONIX) 40 MG tablet Take 1 tablet (40 mg total) by mouth 2 (two) times daily before a meal. (Patient not taking: Reported on 08/07/2017) 08/04/2017: Once daily  . phenylephrine (SUDAFED PE) 10 MG TABS tablet Take 10-30 mg by mouth every 4 (four) hours as needed (congestion).   . promethazine (PHENERGAN) 25 MG tablet Take 25 mg by mouth every 6 (six) hours as needed for nausea or vomiting.    No facility-administered encounter medications on file as of 08/07/2017.    Allergies  Allergen Reactions  . Adhesive [Tape] Rash    Pt is sensitive to any kind of adhesive tape products.   ROS: no fever, chills, vomiting, headache, dizziness, syncope, chest pain, dyspnea.  +abdominal pain/cramps, diarrhea and nausea as per HPI.  Denies urinary symptoms.  Denies bleeding, bruising, rash.    PHYSICAL EXAM:  BP 120/76   Pulse 76   Ht 5' 6.5" (1.689 m)   Wt 149 lb 3.2 oz (67.7 kg)   BMI 23.72 kg/m   Well appearing, talkative female, who doesn't appear to be in any distress HEENT: conjunctiva and sclera are clear, OP is clear, moist mucus membranes Neck: no lymphadenopathy or mass Heart: regular rate and rhythm Lungs: clear bilaterally Back: no spinal or  CVA tenderness Abdomen: hyperactive bowel sounds, not high pitched.  Abdomen is soft. Minimal tenderness across mid-abdomen and on the left. No rebound, guarding or mass, no epigastric or RUQ tenderness. Rectal exam: minimal external skin irritation. No inflamed hemorrhoids.  Normal sphincter tone, no mass.  Only small amount/smear of light brown stool--heme negative Extremities: no edema, normal pulses Neuro: alert and oriented, normal cranial nerves and gait Psych: normal mood, affect, hygiene and grooming  ASSESSMENT/PLAN:   Diarrhea, unspecified type - suspect viral. Given worsening  fatigue, check electrolytes and CBC. BRAT diet, avoid dairy; diet reviewed in detail. Has GI appt next week. - Plan: CBC with Differential/Platelet, Comprehensive metabolic panel  Iron deficiency anemia, unspecified iron deficiency anemia type - Plan: CBC with Differential/Platelet  Duodenal ulcer with hemorrhage - Plan: CBC with Differential/Platelet

## 2017-08-07 NOTE — Patient Instructions (Signed)
Avoid dairy. Restart probiotics. Continue Imodium as needed.  Add some electrolytes back into your system--don't only drink water.  ie Pedialyte.  Go to the hospital if there is high fever, blood in stool, worsening abdominal pain, fainting.   Food Choices to Help Relieve Diarrhea, Adult When you have diarrhea, the foods you eat and your eating habits are very important. Choosing the right foods and drinks can help:  Relieve diarrhea.  Replace lost fluids and nutrients.  Prevent dehydration.  What general guidelines should I follow? Relieving diarrhea  Choose foods with less than 2 g or .07 oz. of fiber per serving.  Limit fats to less than 8 tsp (38 g or 1.34 oz.) a day.  Avoid the following: ? Foods and beverages sweetened with high-fructose corn syrup, honey, or sugar alcohols such as xylitol, sorbitol, and mannitol. ? Foods that contain a lot of fat or sugar. ? Fried, greasy, or spicy foods. ? High-fiber grains, breads, and cereals. ? Raw fruits and vegetables.  Eat foods that are rich in probiotics. These foods include dairy products such as yogurt and fermented milk products. They help increase healthy bacteria in the stomach and intestines (gastrointestinal tract, or GI tract).  If you have lactose intolerance, avoid dairy products. These may make your diarrhea worse.  Take medicine to help stop diarrhea (antidiarrheal medicine) only as told by your health care provider. Replacing nutrients  Eat small meals or snacks every 3-4 hours.  Eat bland foods, such as white rice, toast, or baked potato, until your diarrhea starts to get better. Gradually reintroduce nutrient-rich foods as tolerated or as told by your health care provider. This includes: ? Well-cooked protein foods. ? Peeled, seeded, and soft-cooked fruits and vegetables. ? Low-fat dairy products.  Take vitamin and mineral supplements as told by your health care provider. Preventing dehydration   Start  by sipping water or a special solution to prevent dehydration (oral rehydration solution, ORS). Urine that is clear or pale yellow means that you are getting enough fluid.  Try to drink at least 8-10 cups of fluid each day to help replace lost fluids.  You may add other liquids in addition to water, such as clear juice or decaffeinated sports drinks, as tolerated or as told by your health care provider.  Avoid drinks with caffeine, such as coffee, tea, or soft drinks.  Avoid alcohol. What foods are recommended? The items listed may not be a complete list. Talk with your health care provider about what dietary choices are best for you. Grains White rice. White, [place], or pita breads (fresh or toasted), including plain rolls, buns, or bagels. White pasta. Saltine, soda, or graham crackers. Pretzels. Low-fiber cereal. Cooked cereals made with water (such as cornmeal, farina, or cream cereals). Plain muffins. Matzo. Melba toast. Zwieback. Vegetables Potatoes (without the skin). Most well-cooked and canned vegetables without skins or seeds. Tender lettuce. Fruits Apple sauce. Fruits canned in juice. Cooked apricots, cherries, grapefruit, peaches, pears, or plums. Fresh bananas and cantaloupe. Meats and other protein foods Baked or boiled chicken. Eggs. Tofu. Fish. Seafood. Smooth nut butters. Ground or well-cooked tender beef, ham, veal, lamb, pork, or poultry. Dairy Plain yogurt, kefir, and unsweetened liquid yogurt. Lactose-free milk, buttermilk, skim milk, or soy milk. Low-fat or nonfat hard cheese. Beverages Water. Low-calorie sports drinks. Fruit juices without pulp. Strained tomato and vegetable juices. Decaffeinated teas. Sugar-free beverages not sweetened with sugar alcohols. Oral rehydration solutions, if approved by your health care provider. Seasoning and other foods  Bouillon, broth, or soups made from recommended foods. What foods are not recommended? The items listed may not be a  complete list. Talk with your health care provider about what dietary choices are best for you. Grains Whole grain, whole wheat, bran, or rye breads, rolls, pastas, and crackers. Wild or brown rice. Whole grain or bran cereals. Barley. Oats and oatmeal. Corn tortillas or taco shells. Granola. Popcorn. Vegetables Raw vegetables. Fried vegetables. Cabbage, broccoli, Brussels sprouts, artichokes, baked beans, beet greens, corn, kale, legumes, peas, sweet potatoes, and yams. Potato skins. Cooked spinach and cabbage. Fruits Dried fruit, including raisins and dates. Raw fruits. Stewed or dried prunes. Canned fruits with syrup. Meat and other protein foods Fried or fatty meats. Deli meats. Chunky nut butters. Nuts and seeds. Beans and lentils. Berniece Salines. Hot dogs. Sausage. Dairy High-fat cheeses. Whole milk, chocolate milk, and beverages made with milk, such as milk shakes. Half-and-half. Cream. sour cream. Ice cream. Beverages Caffeinated beverages (such as coffee, tea, soda, or energy drinks). Alcoholic beverages. Fruit juices with pulp. Prune juice. Soft drinks sweetened with high-fructose corn syrup or sugar alcohols. High-calorie sports drinks. Fats and oils Butter. Cream sauces. Margarine. Salad oils. Plain salad dressings. Olives. Avocados. Mayonnaise. Sweets and desserts Sweet rolls, doughnuts, and sweet breads. Sugar-free desserts sweetened with sugar alcohols such as xylitol and sorbitol. Seasoning and other foods Honey. Hot sauce. Chili powder. Gravy. Cream-based or milk-based soups. Pancakes and waffles. Summary  When you have diarrhea, the foods you eat and your eating habits are very important.  Make sure you get at least 8-10 cups of fluid each day, or enough to keep your urine clear or pale yellow.  Eat bland foods and gradually reintroduce healthy, nutrient-rich foods as tolerated, or as told by your health care provider.  Avoid high-fiber, fried, greasy, or spicy foods. This  information is not intended to replace advice given to you by your health care provider. Make sure you discuss any questions you have with your health care provider. Document Released: 12/21/2003 Document Revised: 09/27/2016 Document Reviewed: 09/27/2016 Elsevier Interactive Patient Education  2017 [employer].

## 2017-08-08 ENCOUNTER — Encounter: Payer: Self-pay | Admitting: Family Medicine

## 2017-08-11 ENCOUNTER — Other Ambulatory Visit: Payer: BLUE CROSS/BLUE SHIELD

## 2017-08-13 ENCOUNTER — Encounter: Payer: Self-pay | Admitting: Family Medicine

## 2017-08-14 ENCOUNTER — Other Ambulatory Visit: Payer: BLUE CROSS/BLUE SHIELD

## 2017-08-14 ENCOUNTER — Ambulatory Visit (INDEPENDENT_AMBULATORY_CARE_PROVIDER_SITE_OTHER): Payer: BLUE CROSS/BLUE SHIELD | Admitting: Nurse Practitioner

## 2017-08-14 ENCOUNTER — Encounter: Payer: Self-pay | Admitting: Nurse Practitioner

## 2017-08-14 VITALS — BP 104/62 | HR 68 | Ht 66.0 in | Wt 147.0 lb

## 2017-08-14 DIAGNOSIS — R110 Nausea: Secondary | ICD-10-CM | POA: Diagnosis not present

## 2017-08-14 DIAGNOSIS — R197 Diarrhea, unspecified: Secondary | ICD-10-CM

## 2017-08-14 DIAGNOSIS — R109 Unspecified abdominal pain: Secondary | ICD-10-CM | POA: Diagnosis not present

## 2017-08-14 DIAGNOSIS — Z800 Family history of malignant neoplasm of digestive organs: Secondary | ICD-10-CM | POA: Diagnosis not present

## 2017-08-14 DIAGNOSIS — R531 Weakness: Secondary | ICD-10-CM

## 2017-08-14 DIAGNOSIS — D649 Anemia, unspecified: Secondary | ICD-10-CM | POA: Diagnosis not present

## 2017-08-14 DIAGNOSIS — K269 Duodenal ulcer, unspecified as acute or chronic, without hemorrhage or perforation: Secondary | ICD-10-CM | POA: Diagnosis not present

## 2017-08-14 MED ORDER — ONDANSETRON HCL 4 MG PO TABS: 4.0000 mg | ORAL_TABLET | Freq: Three times a day (TID) | ORAL | 1 refills | Status: DC | PRN

## 2017-08-14 MED ORDER — DICYCLOMINE HCL 20 MG PO TABS: 20.0000 mg | ORAL_TABLET | Freq: Two times a day (BID) | ORAL | 1 refills | Status: DC | PRN

## 2017-08-14 NOTE — Patient Instructions (Addendum)
If you are age 63 or older, your body mass index should be between 23-30. Your Body mass index is 23.73 kg/m. If this is out of the aforementioned range listed, please consider follow up with your Primary Care Provider.  If you are age 59 or younger, your body mass index should be between 19-25. Your Body mass index is 23.73 kg/m. If this is out of the aformentioned range listed, please consider follow up with your Primary Care Provider.   We have sent the following medications to your pharmacy for you to pick up at your convenience: Bentyl 20 mg Zofran 4 mg  Continue Pepcid twice daily. Imodium as needed. Gas X as needed.   You have been given a  Low Gas Diet.  We have requested records from Dr. Lavina Hamman.  Follow up with Tye Savoy, NP on  August 28, 2017 at 830 am.  Thank you for choosing me and [facility].   Tye Savoy, NP

## 2017-08-14 NOTE — Progress Notes (Addendum)
Chief Complaint:  Hospital follow up  HPI: Patient is a 61 year old female we saw in the hospital early October for evaluation of symptomatic normocytic anemia, FOBT+ in setting of NSAIDs  She was transfused a unit of blood.  Inpatient EGD remarkable for a duodenal ulcer with active bleeding from visible vessel, status post epi injection and BiCAP cautery.  Pylori IgG antibody was just at the cut off for being negative.  Discharged home on BID.  She was supposed to follow-up with her gastroenterologist Dr. Lavina Hamman in [place].  Patient comes in today to a hospital follow up. She wants to transition care to [place]. The only reason she had colonoscopies done in [place] was because of insurance. Per patient , by the time next colonoscopy is due she will have Medicare and can have the procedure done by [place]. Of note, she does have a [facility] of colon cancer in her mother.   Patient feels poor. She a poor appetite, having crampy diarrhea. No nausea. No fevers. No black stools. She stopped the PPI, thought it was causing bloating and nausea and sx did get better with discontinuation of PPI. She has been taking Pepcid. Stools are loose, several times a day.     Past Medical History:  Diagnosis Date  . Allergy 2006   chemical cauterization in spring 2006, and 2nd treatment with good results.(Dr.Shoemaker)  . Bipolar disorder ([place])   . Breast cancer ([place]) 12/2014   invasive ductal carcinoma (LEFT)  . Breast cancer of upper-outer quadrant of left female breast ([place]) 12/22/2014  . Breast hematoma 12/19/2015   had left breast aspiration of 4cm hematoma-path indicates benign hematoma  . Osteoporosis 06/20/2016   T-2.5@spine  on Dexa ([facility])  . PONV (postoperative nausea and vomiting)   . Symptomatic anemia 07/16/2017    Patient's surgical history, family medical history, social history, medications and allergies were all reviewed in Epic    Physical Exam: BP 104/62   Pulse 68   Ht 5\' 6"  (1.676 m)   Wt  147 lb (66.7 kg)   BMI 23.73 kg/m   GENERAL:  Well developed white female in NAD PSYCH: :Pleasant, cooperative, normal affect EENT:  conjunctiva pink, mucous membranes moist, neck supple without masses CARDIAC:  RRR, no murmur heard, no peripheral edema PULM: Normal respiratory effort, lungs CTA bilaterally, no wheezing ABDOMEN:  Nondistended, soft, mild diffuse lower abdominal tenderness. No obvious masses, no hepatomegaly,  normal bowel sounds SKIN:  turgor, no lesions seen Musculoskeletal:  Normal muscle tone, normal strength NEURO: Alert and oriented x 3, no focal neurologic deficits    ASSESSMENT and PLAN:  1. Pleasant 61 year old female recently hospitalized with symptomatic anemia, heme + stools in setting of NSAIDs. EGD >>> duodenal ulcer with visible vessel which was injected and cauterized. On oral iron. Hgb a few days ago up from 7.5 to 11.3.  -right now she has stopped PPI due to bloating / nausea. She is taking Pepcid. She hasn't taken any more NSAIDS -Increase Pepcid to BID. Hold off on PPI, may have c-diff  2. Crampy diarrhea, fatigue. On small amount of Mg+ at home, not likely causing this much diarrhea. I am concerned she may have C-diff in setting of recent clindamycin.  -check stool ASAP -bentyl for cramps.  -zofran as needed for nausea.   3. [facility] of colon cancer in mother. Will get records from Dr. Lavina Hamman in [place]. Patient would like [place] to assume GI care going forward. Sounds like she is  up to date on colonoscopy at this time.   Tye Savoy , NP 08/14/2017, 9:20 AM   Cc:  Rita Ohara , MD  Addendum.  Received colonoscopy reports from [facility]. Colonoscopy 07/08/16 by Dr. Chucky May. Extent of exam to the cecum. Bowel prep score was 6. Findings included internal hemorrhoids, no other abnormalities. Colonoscopy 2012, screening. Extent of exam to the cecum. Quality of the bowel prep was good. Internal hemorrhoids were found. Scan records into Epic and  forward this addendum to patient's primary GI, Dr. Fuller Plan.

## 2017-08-15 LAB — CLOSTRIDIUM DIFFICILE BY PCR: Toxigenic C. Difficile by PCR: DETECTED — AB

## 2017-08-16 ENCOUNTER — Encounter: Payer: Self-pay | Admitting: Nurse Practitioner

## 2017-08-16 ENCOUNTER — Encounter: Payer: Self-pay | Admitting: Family Medicine

## 2017-08-17 ENCOUNTER — Other Ambulatory Visit: Payer: Self-pay

## 2017-08-17 ENCOUNTER — Encounter (HOSPITAL_COMMUNITY): Payer: Self-pay | Admitting: Emergency Medicine

## 2017-08-17 ENCOUNTER — Encounter: Payer: Self-pay | Admitting: Oncology

## 2017-08-17 ENCOUNTER — Telehealth: Payer: Self-pay | Admitting: Family Medicine

## 2017-08-17 ENCOUNTER — Inpatient Hospital Stay (HOSPITAL_COMMUNITY)
Admission: EM | Admit: 2017-08-17 | Discharge: 2017-08-20 | DRG: 641 | Disposition: A | Discharge status: Home / Self Care | Payer: BLUE CROSS/BLUE SHIELD | Attending: Internal Medicine | Admitting: Internal Medicine

## 2017-08-17 DIAGNOSIS — Z171 Estrogen receptor negative status [ER-]: Secondary | ICD-10-CM | POA: Diagnosis not present

## 2017-08-17 DIAGNOSIS — M810 Age-related osteoporosis without current pathological fracture: Secondary | ICD-10-CM | POA: Diagnosis present

## 2017-08-17 DIAGNOSIS — Z8711 Personal history of peptic ulcer disease: Secondary | ICD-10-CM

## 2017-08-17 DIAGNOSIS — E876 Hypokalemia: Secondary | ICD-10-CM | POA: Diagnosis not present

## 2017-08-17 DIAGNOSIS — E860 Dehydration: Secondary | ICD-10-CM | POA: Diagnosis not present

## 2017-08-17 DIAGNOSIS — Z79899 Other long term (current) drug therapy: Secondary | ICD-10-CM

## 2017-08-17 DIAGNOSIS — Z853 Personal history of malignant neoplasm of breast: Secondary | ICD-10-CM

## 2017-08-17 DIAGNOSIS — Z91048 Other nonmedicinal substance allergy status: Secondary | ICD-10-CM

## 2017-08-17 DIAGNOSIS — F319 Bipolar disorder, unspecified: Secondary | ICD-10-CM | POA: Diagnosis not present

## 2017-08-17 DIAGNOSIS — Z9221 Personal history of antineoplastic chemotherapy: Secondary | ICD-10-CM

## 2017-08-17 DIAGNOSIS — A0472 Enterocolitis due to Clostridium difficile, not specified as recurrent: Secondary | ICD-10-CM | POA: Diagnosis present

## 2017-08-17 DIAGNOSIS — D509 Iron deficiency anemia, unspecified: Secondary | ICD-10-CM | POA: Diagnosis not present

## 2017-08-17 DIAGNOSIS — E871 Hypo-osmolality and hyponatremia: Secondary | ICD-10-CM | POA: Diagnosis present

## 2017-08-17 DIAGNOSIS — Z923 Personal history of irradiation: Secondary | ICD-10-CM

## 2017-08-17 DIAGNOSIS — C50412 Malignant neoplasm of upper-outer quadrant of left female breast: Secondary | ICD-10-CM

## 2017-08-17 HISTORY — DX: Enterocolitis due to Clostridium difficile, not specified as recurrent: A04.72

## 2017-08-17 LAB — COMPREHENSIVE METABOLIC PANEL
ALK PHOS: 72 U/L (ref 38–126)
ALT: 9 U/L — ABNORMAL LOW (ref 14–54)
ANION GAP: 11 (ref 5–15)
AST: 15 U/L (ref 15–41)
Albumin: 2.8 g/dL — ABNORMAL LOW (ref 3.5–5.0)
BUN: <5 mg/dL — ABNORMAL LOW (ref 6–20)
CALCIUM: 7.8 mg/dL — AB (ref 8.9–10.3)
CHLORIDE: 95 mmol/L — AB (ref 101–111)
CO2: 24 mmol/L (ref 22–32)
Creatinine, Ser: 1.04 mg/dL — ABNORMAL HIGH (ref 0.44–1.00)
GFR, EST NON AFRICAN AMERICAN: 56 mL/min — AB (ref >60)
Glucose, Bld: 119 mg/dL — ABNORMAL HIGH (ref 65–99)
Potassium: 3.3 mmol/L — ABNORMAL LOW (ref 3.5–5.1)
SODIUM: 130 mmol/L — AB (ref 135–145)
Total Bilirubin: 0.5 mg/dL (ref 0.3–1.2)
Total Protein: 5.7 g/dL — ABNORMAL LOW (ref 6.5–8.1)

## 2017-08-17 LAB — CBC
HCT: 33.9 % — ABNORMAL LOW (ref 36.0–46.0)
HEMOGLOBIN: 11.2 g/dL — AB (ref 12.0–15.0)
MCH: 29.6 pg (ref 26.0–34.0)
MCHC: 33 g/dL (ref 30.0–36.0)
MCV: 89.4 fL (ref 78.0–100.0)
Platelets: 297 10*3/uL (ref 150–400)
RBC: 3.79 MIL/uL — AB (ref 3.87–5.11)
RDW: 15 % (ref 11.5–15.5)
WBC: 18 10*3/uL — AB (ref 4.0–10.5)

## 2017-08-17 LAB — LIPASE, BLOOD: LIPASE: 23 U/L (ref 11–51)

## 2017-08-17 MED ORDER — SODIUM CHLORIDE 0.9 % IV BOLUS (SEPSIS)
1000.0000 mL | Freq: Once | INTRAVENOUS | Status: AC
  Administered 2017-08-17: 1000 mL via INTRAVENOUS

## 2017-08-17 MED ORDER — MORPHINE SULFATE (PF) 4 MG/ML IV SOLN: 2.0000 mg | INTRAVENOUS | Status: DC | PRN

## 2017-08-17 MED ORDER — ACETAMINOPHEN 650 MG RE SUPP: 650.0000 mg | Freq: Four times a day (QID) | RECTAL | Status: DC | PRN

## 2017-08-17 MED ORDER — VANCOMYCIN 50 MG/ML ORAL SOLUTION: 125.0000 mg | Freq: Four times a day (QID) | ORAL | Status: DC

## 2017-08-17 MED ORDER — PROMETHAZINE HCL 25 MG/ML IJ SOLN
12.5000 mg | Freq: Once | INTRAMUSCULAR | Status: AC
  Administered 2017-08-17: 12.5 mg via INTRAVENOUS
  Filled 2017-08-17: qty 1

## 2017-08-17 MED ORDER — ENOXAPARIN SODIUM 40 MG/0.4ML ~~LOC~~ SOLN
40.0000 mg | SUBCUTANEOUS | Status: DC
  Administered 2017-08-18 – 2017-08-19 (×2): 40 mg via SUBCUTANEOUS
  Filled 2017-08-17 (×2): qty 0.4

## 2017-08-17 MED ORDER — SODIUM CHLORIDE 0.9 % IV SOLN
INTRAVENOUS | Status: DC
  Administered 2017-08-18: 17:00:00 via INTRAVENOUS
  Administered 2017-08-18: 1000 mL via INTRAVENOUS
  Administered 2017-08-19: 07:00:00 via INTRAVENOUS

## 2017-08-17 MED ORDER — POTASSIUM CHLORIDE CRYS ER 20 MEQ PO TBCR
40.0000 meq | EXTENDED_RELEASE_TABLET | Freq: Once | ORAL | Status: AC
  Administered 2017-08-17: 40 meq via ORAL
  Filled 2017-08-17: qty 2

## 2017-08-17 MED ORDER — ZOLPIDEM TARTRATE 5 MG PO TABS: 5.0000 mg | ORAL_TABLET | Freq: Every evening | ORAL | Status: DC | PRN

## 2017-08-17 MED ORDER — SODIUM CHLORIDE 0.9 % IV BOLUS (SEPSIS)
2000.0000 mL | Freq: Once | INTRAVENOUS | Status: AC
  Administered 2017-08-17: 2000 mL via INTRAVENOUS

## 2017-08-17 MED ORDER — ONDANSETRON HCL 4 MG/2ML IJ SOLN
4.0000 mg | Freq: Three times a day (TID) | INTRAMUSCULAR | Status: DC | PRN
  Filled 2017-08-17: qty 2

## 2017-08-17 MED ORDER — ONDANSETRON HCL 4 MG/2ML IJ SOLN
4.0000 mg | Freq: Once | INTRAMUSCULAR | Status: AC
  Administered 2017-08-17: 4 mg via INTRAVENOUS
  Filled 2017-08-17: qty 2

## 2017-08-17 MED ORDER — ACETAMINOPHEN 325 MG PO TABS
650.0000 mg | ORAL_TABLET | Freq: Four times a day (QID) | ORAL | Status: DC | PRN
  Administered 2017-08-20: 650 mg via ORAL
  Filled 2017-08-17: qty 2

## 2017-08-17 MED ORDER — VANCOMYCIN 50 MG/ML ORAL SOLUTION
125.0000 mg | Freq: Four times a day (QID) | ORAL | Status: DC
  Administered 2017-08-17 – 2017-08-19 (×9): 125 mg via ORAL
  Filled 2017-08-17 (×10): qty 2.5

## 2017-08-17 NOTE — H&P (Deleted)
History and Physical    Rhonda Gardner CHE:[id number] DOB: 1956-06-19 DOA: 08/17/2017  Referring MD/NP/PA:   PCP: Rita Ohara, MD   Patient coming from:  The patient is coming from home.  At baseline, pt is independent for most of ADL.        Chief Complaint: Diarrhea  HPI: Rhonda Gardner is a 61 y.o. female with medical history significant of GI bleeding, peptic ulcer disease, iron deficiency anemia, bipolar disorder, left breast cancer (s/p of lumpectomy, radiation therapy and chemotherapy), who presents with diarrhea.  Patient states that she has been having intermittent diarrhea for almost a month, which has worsened in the past several days. She has at least 6 watery diarrhea each day. She has nausea, but no vomiting or abdominal pain. Denies fever or chills. Patient states that she used clindamycin due to hemorrhagic peptic ulcer disease recently. Currently patient has lightheadedness. No fall. Denies chest pain, shortness breath, cough, symptoms of UTI or unilateral weakness. Patient states that pt was seen by GI, had positive C. difficile study. She was started with Flagyl by GI doctor yesterday. Patient presents to the emergency room today because she feels like she is getting dehydrated.    ED Course: pt was found to have WBC 18.0, lactic acid 1.0, potassium is 3.3, creatinine normal, lipase is 23, temperature normal, soft blood pressure, no tachycardia, no tachypnea, O2 sat are 96% on room air. Patient is placed on MedSurg bed for observation.  Review of Systems:   General: no fevers, chills, no body weight gain, has poor appetite, has fatigue HEENT: no blurry vision, hearing changes or sore throat Respiratory: no dyspnea, coughing, wheezing CV: no chest pain, no palpitations GI: has nausea and diarrhea, no constipation, vomiting, bdominal pain, GU: no dysuria, burning on urination, increased urinary frequency, hematuria  Ext: no leg edema Neuro: no unilateral  weakness, numbness, or tingling, no vision change or hearing loss Skin: no rash, no skin tear. MSK: No muscle spasm, no deformity, no limitation of range of movement in spin Heme: No easy bruising.  Travel history: No recent long distant travel.  Allergy:  Allergies  Allergen Reactions  . Adhesive [Tape] Rash    Pt is sensitive to any kind of adhesive tape products.    Past Medical History:  Diagnosis Date  . Allergy 2006   chemical cauterization in spring 2006, and 2nd treatment with good results.(Dr.Shoemaker)  . Bipolar disorder ([place])   . Breast cancer ([place]) 12/2014   invasive ductal carcinoma (LEFT)  . Breast cancer of upper-outer quadrant of left female breast ([place]) 12/22/2014  . Breast hematoma 12/19/2015   had left breast aspiration of 4cm hematoma-path indicates benign hematoma  . C. difficile diarrhea   . Osteoporosis 06/20/2016   T-2.5@spine  on Dexa ([facility])  . PONV (postoperative nausea and vomiting)   . Symptomatic anemia 07/16/2017    Past Surgical History:  Procedure Laterality Date  . BREAST BIOPSY Bilateral    benign and a right stereotatic breast biopsy.  . fibroid excision  age 17   prolapsed fibroid removed  . FOOT SURGERY Left 2014   Dr. Paulla Dolly  . FOOT SURGERY Right 08/2016   Dr. Paulla Dolly --Shortening Osteotomy with Fixation 1st Metatarsal   . MOUTH SURGERY  06/2017   [facility]     Social History:  reports that  has never smoked. she has never used smokeless tobacco. She reports that she drinks alcohol. She reports that she does not use drugs.  Family History:  Family History  Problem Relation Age of Onset  . Cancer Father        skin - non melanoma - farmer  . Heart disease Father   . Hypertension Mother   . Arthritis Mother        osteoarthritis  . Colon cancer Mother   . Macular degeneration Mother        wet and dry  . Cancer Mother 11       colon cancer at age 44  . Osteoporosis Sister        osteopenia (states it resolved)  . Cancer Maternal  Aunt        lung cancer  . Cancer Paternal Aunt 52       ? stomach cancer  . Autism Sister   . Mental retardation Sister   . Blindness Sister        secondary to retrolental fibroplasia  . Osteoporosis Sister   . Goiter Paternal Aunt   . Diabetes Paternal Aunt      Prior to Admission medications   Medication Sig Start Date End Date Taking? Authorizing Provider  Cholecalciferol (VITAMIN D) 2000 UNITS tablet Take 2,000 Units by mouth daily.    [provider]  dicyclomine (BENTYL) 20 MG tablet Take 1 tablet (20 mg total) by mouth 2 (two) times daily as needed for spasms (cramps). 08/14/17   Willia Craze, NP  ferrous sulfate 325 (65 FE) MG tablet Take 1 tablet (325 mg total) by mouth 3 (three) times daily with meals. Patient taking differently: Take 325 mg by mouth 2 (two) times daily with a meal.  07/19/17   Debbe Odea, MD  fluticasone (FLONASE) 50 MCG/ACT nasal spray Place 2 sprays into both nostrils daily.    [provider]  LAMICTAL 200 MG tablet Take 0.5 tablets (100 mg total) by mouth daily. 02/24/17   Rita Ohara, MD  LUTEIN-ZEAXANTHIN PO Take 1 tablet by mouth daily.    [provider]  magnesium 30 MG tablet Take 30 mg by mouth daily.    [provider]  ondansetron (ZOFRAN) 4 MG tablet Take 1 tablet (4 mg total) by mouth every 8 (eight) hours as needed for nausea or vomiting. 08/14/17   Willia Craze, NP  phenylephrine (SUDAFED PE) 10 MG TABS tablet Take 10-30 mg by mouth every 4 (four) hours as needed (congestion).    [provider]  promethazine (PHENERGAN) 25 MG tablet Take 25 mg by mouth every 6 (six) hours as needed for nausea or vomiting.    Wallene Huh, DPM  saccharomyces boulardii (FLORASTOR) 250 MG capsule Take 250 mg by mouth 2 (two) times daily.    [provider]  Zoledronic Acid (ZOMETA IV) Inject into the vein.    [provider]    Physical Exam: Vitals:   08/17/17 2215 08/17/17 2300  08/18/17 0040 08/18/17 0526  BP: (!) 115/59 107/61 (!) 96/42 (!) 91/44  Pulse: 84 90 89 80  Resp:   16 16  Temp:   (!) 100.7 F (38.2 C) 99.1 F (37.3 C)  TempSrc:   Oral Oral  SpO2: 99% 97% 98% 99%  Weight:   64.4 kg (142 lb)   Height:   5\' 6"  (1.676 m)    General: Not in acute distress. Has dry mucus and membrane HEENT:       Eyes: PERRL, EOMI, no scleral icterus.       ENT: No discharge from the ears and nose, no  pharynx injection, no tonsillar enlargement.        Neck: No JVD, no bruit, no mass felt. Heme: No neck lymph node enlargement. Cardiac: S1/S2, RRR, No murmurs, No gallops or rubs. Respiratory: No rales, wheezing, rhonchi or rubs. GI: Soft, nondistended, nontender, no rebound pain, no organomegaly, BS present. GU: No hematuria Ext: No pitting leg edema bilaterally. 2+DP/PT pulse bilaterally. Musculoskeletal: No joint deformities, No joint redness or warmth, no limitation of ROM in spin. Skin: No rashes.  Neuro: Alert, oriented X3, cranial nerves II-XII grossly intact, moves all extremities normally.   Psych: Patient is not psychotic, no suicidal or hemocidal ideation.  Labs on Admission: I have personally reviewed following labs and imaging studies  CBC: Recent Labs  Lab 08/17/17 1845 08/18/17 0039  WBC 18.0* 18.0*  HGB 11.2* 11.2*  HCT 33.9* 34.4*  MCV 89.4 90.8  PLT 297 454   Basic Metabolic Panel: Recent Labs  Lab 08/17/17 1845 08/18/17 0039  NA 130* 133*  K 3.3* 3.3*  CL 95* 104  CO2 24 22  GLUCOSE 119* 121*  BUN <5* <5*  CREATININE 1.04* 0.90  CALCIUM 7.8* 7.1*  MG  --  1.9   GFR: Estimated Creatinine Clearance: 59.9 mL/min (by C-G formula based on SCr of 0.9 mg/dL). Liver Function Tests: Recent Labs  Lab 08/17/17 1845  AST 15  ALT 9*  ALKPHOS 72  BILITOT 0.5  PROT 5.7*  ALBUMIN 2.8*   Recent Labs  Lab 08/17/17 1845  LIPASE 23   No results for input(s): AMMONIA in the last 168 hours. Coagulation Profile: No results for  input(s): INR, PROTIME in the last 168 hours. Cardiac Enzymes: No results for input(s): CKTOTAL, CKMB, CKMBINDEX, TROPONINI in the last 168 hours. BNP (last 3 results) No results for input(s): PROBNP in the last 8760 hours. HbA1C: No results for input(s): HGBA1C in the last 72 hours. CBG: No results for input(s): GLUCAP in the last 168 hours. Lipid Profile: No results for input(s): CHOL, HDL, LDLCALC, TRIG, CHOLHDL, LDLDIRECT in the last 72 hours. Thyroid Function Tests: No results for input(s): TSH, T4TOTAL, FREET4, T3FREE, THYROIDAB in the last 72 hours. Anemia Panel: No results for input(s): VITAMINB12, FOLATE, FERRITIN, TIBC, IRON, RETICCTPCT in the last 72 hours. Urine analysis:    Component Value Date/Time   LABSPEC 1.005 04/03/2015 1451   PHURINE 7.5 04/03/2015 1451   PHURINE 7.5 08/09/2007 2121   GLUCOSEU Negative 04/03/2015 1451   HGBUR Trace 04/03/2015 1451   HGBUR SMALL (A) 08/09/2007 2121   BILIRUBINUR neg 04/10/2017 0858   BILIRUBINUR Negative 04/03/2015 1451   KETONESUR Negative 04/03/2015 1451   [place] 08/09/2007 2121   PROTEINUR neg 04/10/2017 0858   PROTEINUR Negative 04/03/2015 1451   PROTEINUR NEGATIVE 08/09/2007 2121   UROBILINOGEN negative (A) 04/10/2017 0858   UROBILINOGEN 0.2 04/03/2015 1451   NITRITE neg 04/10/2017 0858   NITRITE Negative 04/03/2015 1451   NITRITE NEGATIVE 08/09/2007 2121   LEUKOCYTESUR Negative 04/10/2017 0858   LEUKOCYTESUR Small 04/03/2015 1451   Sepsis Labs: @LABRCNTIP (procalcitonin:4,lacticidven:4) ) Recent Results (from the past 240 hour(s))  Clostridium Difficile by PCR     Status: Abnormal   Collection Time: 08/14/17 12:45 PM  Result Value Ref Range Status   Toxigenic C Difficile by pcr DETECTED (A) NOT DETECT Final    Comment: . The stool sample is POSITIVE for toxigenic C. difficile.  This result is suggestive of C. difficile infection  (CDI) if accompanied by appropriate clinical  symptoms. . Simultaneous testing  does not identify a genetic marker of the hypervirulent 027/NAP1/BI strain of toxigenic  C. difficile. . This test is for use only with liquid or soft stools;  performance characteristics of other clinical specimen  types have not been established. . This assay was performed by [employer]heid GeneXpert(R) PCR. The performance characteristics of this assay have been determined by [employer]. Performance characteristics refer to the analytical performance of the test. . For additional information, please refer to http://education.[employer].com/faq/FAQ136 (This link is being provided for informational/educational purposes only.)      Radiological Exams on Admission: No results found.   EKG:  Not done in ED, will get one.   Assessment/Plan Principal Problem:   C. difficile colitis Active Problems:   Malignant neoplasm of upper-outer quadrant of left breast in female, estrogen receptor negative (HCC)   Bipolar 1 disorder (HCC)   Iron deficiency anemia   Dehydration   Hypokalemia   Principal Problem:   C. difficile colitis Active Problems:   Malignant neoplasm of upper-outer quadrant of left breast in female, estrogen receptor negative (HCC)   Bipolar 1 disorder (HCC)   Iron deficiency anemia   Dehydration   Hypokalemia       DVT ppx: SQ Lovenox Code Status: Full code Family Communication: None at bed side.       Disposition Plan:  Anticipate discharge back to previous home environment Consults called:  husband Admission status:  medical floor/obs      Date of Service 08/18/2017    Ivor Costa [facility] Pager [phone]  If 7PM-7AM, please contact night-coverage [email or web address] Password [email or web address] 08/18/2017, 5:29 AM

## 2017-08-17 NOTE — Telephone Encounter (Signed)
Beth, I spoke with patient over the weekend. Dr. Benson Norway got the C-diff positive call over weekend and started Flagyl. I think we will change her to po vancomycin 125 mg QID x 10 days. She knows that you will be contacting her. Thanks

## 2017-08-17 NOTE — ED Provider Notes (Signed)
[facility] DEPART[facility] Provider Note   CSN: [id number] Arrival date & time: 08/17/17  1837     History   Chief Complaint Chief Complaint  Patient presents with  . Nausea  . Dehydration    HPI Rhonda Gardner is a 61 y.o. female.  HPI Patient presents to the emergency room for evaluation of nausea, diarrhea and dehydration.  Patient was admitted to the hospital in the beginning of October.  She was admitted for symptomatic anemia and shortness of breath.  She underwent endoscopy and was found to have a hemorrhagic ulcer.  Patient was ultimately released from the hospital but she started developing diarrhea (she had been on clinda).  She saw her primary doctor the end of October.  She has been having yellow mucousy stools.  Patient saw her primary care doctor the beginning of November and saw GI.  She had a positive C. difficile study patient was started on Flagyl just within the last couple of days. Pt states she started on the flagyl until they can start her on oral vanco.  That was ordered by the GI doctor.  Patient presents to the emergency room today because she feels like she is getting dehydrated.  She continues to feel nauseated.  She does not have much appetite and has not been eating or drinking properly.  Her diarrhea is decreasing but she still has about 6 loose watery stools per day. Past Medical History:  Diagnosis Date  . Allergy 2006   chemical cauterization in spring 2006, and 2nd treatment with good results.(Dr.Shoemaker)  . Bipolar disorder ([place])   . Breast cancer ([place]) 12/2014   invasive ductal carcinoma (LEFT)  . Breast cancer of upper-outer quadrant of left female breast ([place]) 12/22/2014  . Breast hematoma 12/19/2015   had left breast aspiration of 4cm hematoma-path indicates benign hematoma  . C. difficile diarrhea   . Osteoporosis 06/20/2016   T-2.5@spine  on Dexa ([facility])  . PONV (postoperative nausea and vomiting)   .  Symptomatic anemia 07/16/2017    Patient Active Problem List   Diagnosis Date Noted  . Symptomatic anemia 07/16/2017  . Bipolar 1 disorder ([place]) 07/16/2017  . Tachycardia 07/16/2017  . Hypotension due to hypovolemia 07/16/2017  . Melena   . Duodenal ulcer with hemorrhage   . Osteoporosis 07/29/2016  . Genetic counseling and testing 01/29/2016  . Pain in joint, pelvic region and thigh 06/12/2015  . 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 Facial flushing 01/25/2015  . Insomnia 01/25/2015  . Allergy to adhesive tape 01/25/2015  . Malignant neoplasm of upper-outer quadrant of left breast in female, estrogen receptor negative ([place]) 12/22/2014    Past Surgical History:  Procedure Laterality Date  . BREAST BIOPSY Bilateral    benign and a right stereotatic breast biopsy.  . fibroid excision  age 5   prolapsed fibroid removed  . FOOT SURGERY Left 2014   Dr. Paulla Dolly  . FOOT SURGERY Right 08/2016   Dr. Paulla Dolly --Shortening Osteotomy with Fixation 1st Metatarsal   . MOUTH SURGERY  06/2017   CYST     OB History    Gravida Para Term Preterm AB Living   1       1     SAB TAB Ectopic Multiple Live Births     1             Home Medications    Prior to Admission medications   Medication Sig Start Date End Date Taking? Authorizing Provider  Cholecalciferol (VITAMIN D) 2000 UNITS tablet Take 2,000 Units by mouth daily.    [provider]  dicyclomine (BENTYL) 20 MG tablet Take 1 tablet (20 mg total) by mouth 2 (two) times daily as needed for spasms (cramps). 08/14/17   Willia Craze, NP  ferrous sulfate 325 (65 FE) MG tablet Take 1 tablet (325 mg total) by mouth 3 (three) times daily with meals. Patient taking differently: Take 325 mg by mouth 2 (two) times daily with a meal.  07/19/17   Debbe Odea, MD  fluticasone (FLONASE) 50 MCG/ACT nasal spray Place 2 sprays into both nostrils daily.    [provider]  LAMICTAL 200 MG tablet Take 0.5 tablets (100  mg total) by mouth daily. 02/24/17   Rita Ohara, MD  LUTEIN-ZEAXANTHIN PO Take 1 tablet by mouth daily.    [provider]  magnesium 30 MG tablet Take 30 mg by mouth daily.    [provider]  ondansetron (ZOFRAN) 4 MG tablet Take 1 tablet (4 mg total) by mouth every 8 (eight) hours as needed for nausea or vomiting. 08/14/17   Willia Craze, NP  phenylephrine (SUDAFED PE) 10 MG TABS tablet Take 10-30 mg by mouth every 4 (four) hours as needed (congestion).    [provider]  promethazine (PHENERGAN) 25 MG tablet Take 25 mg by mouth every 6 (six) hours as needed for nausea or vomiting.    Wallene Huh, DPM  saccharomyces boulardii (FLORASTOR) 250 MG capsule Take 250 mg by mouth 2 (two) times daily.    [provider]  Zoledronic Acid (ZOMETA IV) Inject into the vein.    [provider]    Family History Family History  Problem Relation Age of Onset  . Cancer Father        skin - non melanoma - farmer  . Heart disease Father   . Hypertension Mother   . Arthritis Mother        osteoarthritis  . Colon cancer Mother   . Macular degeneration Mother        wet and dry  . Cancer Mother 60       colon cancer at age 77  . Osteoporosis Sister        osteopenia (states it resolved)  . Cancer Maternal Aunt        lung cancer  . Cancer Paternal Aunt 67       ? stomach cancer  . Autism Sister   . Mental retardation Sister   . Blindness Sister        secondary to retrolental fibroplasia  . Osteoporosis Sister   . Goiter Paternal Aunt   . Diabetes Paternal Aunt     Social History Social History   Tobacco Use  . Smoking status: Never Smoker  . Smokeless tobacco: Never Used  Substance Use Topics  . Alcohol use: Yes    Alcohol/week: 0.0 oz    Comment: 1-2 glasses of wine a month  . Drug use: No     Allergies   Adhesive [tape]   Review of Systems Review of Systems  All other systems reviewed and are negative.    Physical  Exam Updated Vital Signs BP (!) 109/56   Pulse 88   Temp 98.9 F (37.2 C) (Oral)   Resp 16   Ht 1.676 m (5\' 6" )   Wt 64.4 kg (142 lb)   SpO2 96%   BMI 22.92 kg/m   Physical Exam  Constitutional: She  appears well-developed and well-nourished. No distress.  HENT:  Head: Normocephalic and atraumatic.  Right Ear: External ear normal.  Left Ear: External ear normal.  Mucous membranes are dry  Eyes: Conjunctivae are normal. Right eye exhibits no discharge. Left eye exhibits no discharge. No scleral icterus.  Neck: Neck supple. No tracheal deviation present.  Cardiovascular: Normal rate, regular rhythm and intact distal pulses.  Pulmonary/Chest: Effort normal and breath sounds normal. No stridor. No respiratory distress. She has no wheezes. She has no rales.  Abdominal: Soft. Bowel sounds are normal. She exhibits no distension and no mass. There is tenderness. There is no rebound and no guarding.  Mild tenderness in the left lower quadrant and right lower quadrant, no rebound or guarding  Musculoskeletal: She exhibits no edema or tenderness.  Neurological: She is alert. She has normal strength. No cranial nerve deficit (no facial droop, extraocular movements intact, no slurred speech) or sensory deficit. She exhibits normal muscle tone. She displays no seizure activity. Coordination normal.  Skin: Skin is warm and dry. No rash noted.  Psychiatric: She has a normal mood and affect.  Nursing note and vitals reviewed.    ED Treatments / Results  Labs (all labs ordered are listed, but only abnormal results are displayed) Labs Reviewed  COMPREHENSIVE METABOLIC PANEL - Abnormal; Notable for the following components:      Result Value   Sodium 130 (*)    Potassium 3.3 (*)    Chloride 95 (*)    Glucose, Bld 119 (*)    BUN <5 (*)    Creatinine, Ser 1.04 (*)    Calcium 7.8 (*)    Total Protein 5.7 (*)    Albumin 2.8 (*)    ALT 9 (*)    GFR calc non Af Amer 56 (*)    All other  components within normal limits  CBC - Abnormal; Notable for the following components:   WBC 18.0 (*)    RBC 3.79 (*)    Hemoglobin 11.2 (*)    HCT 33.9 (*)    All other components within normal limits  LIPASE, BLOOD    Radiology No results found.  Procedures Procedures (including critical care time)  Medications Ordered in ED Medications  vancomycin (VANCOCIN) 50 mg/mL oral solution 125 mg (not administered)  potassium chloride SA (K-DUR,KLOR-CON) CR tablet 40 mEq (not administered)  sodium chloride 0.9 % bolus 1,000 mL (not administered)  promethazine (PHENERGAN) injection 12.5 mg (not administered)  sodium chloride 0.9 % bolus 2,000 mL (0 mLs Intravenous Stopped 08/17/17 1949)  ondansetron (ZOFRAN) injection 4 mg (4 mg Intravenous Given 08/17/17 1931)     Initial Impression / Assessment and Plan / ED Course  I have reviewed the triage vital signs and the nursing notes.  Pertinent labs & imaging results that were available during my care of the patient were reviewed by me and considered in my medical decision making (see chart for details).  Clinical Course as of Aug 18 2219  Nancy Fetter Aug 17, 2017  2059 Calcium level low associated with her low albumin.  Mild hyponatremia and hypokalemia associated with an increase in her creatinine.  Likely related to dehydration associated with her C diff  [JK]  2100 Pt has received 1 liter of lfuid.  2nd liter is infusing.  She is feeling better.  WBC is elevated.  Suspect this is related to the known c diff.  Abdomen is benign.    [JK]  2101 BP is running low compared to  her normal vitals.  Will reassess after fluid bolus  [JK]  2216 Orthostatics performed after 2 liters of IV fluid.  BP dropped to 70s while standing.  Will give additional fluids.  Plan on admission at this point.  [JK]    Clinical Course User Index [JK] Dorie Rank, MD    Patient presented to the emergency room with complaints of nausea, malaise and symptoms concerning for  dehydration.  Patient was recently diagnosed with C. difficile.  She was started on Flagyl while they were waiting for oral vancomycin to be available.  Patient symptoms continue to progress.  Patient has mild hypokalemia, hyponatremia and acute renal insufficiency.  Leukocytosis likely related to her C. difficile but no signs of sepsis.  Despite IV fluid treatment patient continued to remain orthostatic.  At this point I think it would be best to bring her to the hospital for IV fluids.  We can continue the oral vancomycin treatment.  Final Clinical Impressions(s) / ED Diagnoses   Final diagnoses:  Dehydration  Hyponatremia  Hypokalemia  C. difficile diarrhea      Dorie Rank, MD 08/17/17 2220

## 2017-08-17 NOTE — ED Triage Notes (Addendum)
Pt believes she is dehydrated.  Reports diarrhea x 1 month and was diagnosed with c diff on Saturday.  Denies diarrhea today.  C/o nausea, dry mouth, and chapped lips.

## 2017-08-17 NOTE — ED Notes (Signed)
Task below clicked off on accident, by Probation officer

## 2017-08-17 NOTE — ED Notes (Signed)
Attempted to call report

## 2017-08-17 NOTE — Telephone Encounter (Signed)
Received a call about positive C diff for this pt at approx 2pm.  It looks like Dr. Tomi Bamberger and Tye Savoy from GI had both reached out to her about this yesterday- I am not sure why this stat is just being called to me today.  In any case vanc is not yet called in-  Called pt but did not reach.  LMOM asking her to call Colletta [place] back on her cell as she had requested

## 2017-08-17 NOTE — Progress Notes (Signed)
Reviewed and agree with management plan.  Jacquise Rarick T. Shakoya Gilmore, MD FACG 

## 2017-08-17 NOTE — ED Notes (Signed)
Called pharmacy to check and see if Vanc was in the pyxis. Stated they were verifying it and will send it up

## 2017-08-17 NOTE — ED Notes (Signed)
Consulting MD at bedside (Hospitalist). 

## 2017-08-18 ENCOUNTER — Telehealth: Payer: Self-pay | Admitting: Nurse Practitioner

## 2017-08-18 ENCOUNTER — Other Ambulatory Visit: Payer: BLUE CROSS/BLUE SHIELD

## 2017-08-18 ENCOUNTER — Telehealth: Payer: Self-pay | Admitting: Oncology

## 2017-08-18 ENCOUNTER — Ambulatory Visit: Payer: BLUE CROSS/BLUE SHIELD

## 2017-08-18 DIAGNOSIS — A0472 Enterocolitis due to Clostridium difficile, not specified as recurrent: Secondary | ICD-10-CM | POA: Diagnosis not present

## 2017-08-18 DIAGNOSIS — E876 Hypokalemia: Secondary | ICD-10-CM | POA: Diagnosis not present

## 2017-08-18 DIAGNOSIS — E860 Dehydration: Secondary | ICD-10-CM | POA: Diagnosis not present

## 2017-08-18 DIAGNOSIS — F319 Bipolar disorder, unspecified: Secondary | ICD-10-CM | POA: Diagnosis not present

## 2017-08-18 LAB — CBC
HEMATOCRIT: 34.4 % — AB (ref 36.0–46.0)
HEMOGLOBIN: 11.2 g/dL — AB (ref 12.0–15.0)
MCH: 29.6 pg (ref 26.0–34.0)
MCHC: 32.6 g/dL (ref 30.0–36.0)
MCV: 90.8 fL (ref 78.0–100.0)
Platelets: 313 10*3/uL (ref 150–400)
RBC: 3.79 MIL/uL — ABNORMAL LOW (ref 3.87–5.11)
RDW: 14.8 % (ref 11.5–15.5)
WBC: 18 10*3/uL — ABNORMAL HIGH (ref 4.0–10.5)

## 2017-08-18 LAB — BASIC METABOLIC PANEL
ANION GAP: 7 (ref 5–15)
CALCIUM: 7.1 mg/dL — AB (ref 8.9–10.3)
CO2: 22 mmol/L (ref 22–32)
Chloride: 104 mmol/L (ref 101–111)
Creatinine, Ser: 0.9 mg/dL (ref 0.44–1.00)
GFR calc Af Amer: >60 mL/min (ref >60)
GLUCOSE: 121 mg/dL — AB (ref 65–99)
Potassium: 3.3 mmol/L — ABNORMAL LOW (ref 3.5–5.1)
Sodium: 133 mmol/L — ABNORMAL LOW (ref 135–145)

## 2017-08-18 LAB — PROCALCITONIN: PROCALCITONIN: 0.25 ng/mL

## 2017-08-18 LAB — LACTIC ACID, PLASMA: LACTIC ACID, VENOUS: 1 mmol/L (ref 0.5–1.9)

## 2017-08-18 LAB — MAGNESIUM: Magnesium: 1.9 mg/dL (ref 1.7–2.4)

## 2017-08-18 MED ORDER — MAGNESIUM OXIDE 400 (241.3 MG) MG PO TABS
200.0000 mg | ORAL_TABLET | Freq: Every day | ORAL | Status: DC
  Administered 2017-08-18 – 2017-08-20 (×3): 200 mg via ORAL
  Filled 2017-08-18 (×3): qty 1

## 2017-08-18 MED ORDER — FLUTICASONE PROPIONATE 50 MCG/ACT NA SUSP
2.0000 | Freq: Every day | NASAL | Status: DC
  Administered 2017-08-18 – 2017-08-20 (×3): 2 via NASAL
  Filled 2017-08-18: qty 16

## 2017-08-18 MED ORDER — SACCHAROMYCES BOULARDII 250 MG PO CAPS
250.0000 mg | ORAL_CAPSULE | Freq: Two times a day (BID) | ORAL | Status: DC
  Administered 2017-08-18 – 2017-08-20 (×6): 250 mg via ORAL
  Filled 2017-08-18 (×6): qty 1

## 2017-08-18 MED ORDER — FERROUS SULFATE 325 (65 FE) MG PO TABS
325.0000 mg | ORAL_TABLET | Freq: Three times a day (TID) | ORAL | Status: DC
Start: 2017-08-18 — End: 2017-08-20
  Administered 2017-08-18 – 2017-08-20 (×8): 325 mg via ORAL
  Filled 2017-08-18 (×8): qty 1

## 2017-08-18 MED ORDER — MORPHINE SULFATE (PF) 4 MG/ML IV SOLN: 1.0000 mg | INTRAVENOUS | Status: DC | PRN

## 2017-08-18 MED ORDER — SODIUM CHLORIDE 0.9 % IV BOLUS (SEPSIS)
500.0000 mL | Freq: Once | INTRAVENOUS | Status: AC
  Administered 2017-08-18: 500 mL via INTRAVENOUS

## 2017-08-18 MED ORDER — LAMOTRIGINE 200 MG PO TABS
100.0000 mg | ORAL_TABLET | Freq: Every day | ORAL | Status: DC
  Administered 2017-08-18 – 2017-08-20 (×3): 100 mg via ORAL
  Filled 2017-08-18 (×5): qty 1

## 2017-08-18 MED ORDER — DICYCLOMINE HCL 20 MG PO TABS
20.0000 mg | ORAL_TABLET | Freq: Two times a day (BID) | ORAL | Status: DC | PRN
  Administered 2017-08-18: 20 mg via ORAL
  Filled 2017-08-18: qty 1

## 2017-08-18 MED ORDER — LUTEIN-ZEAXANTHIN 20-1 MG PO CAPS: 1.0000 | ORAL_CAPSULE | Freq: Every day | ORAL | Status: DC

## 2017-08-18 MED ORDER — PROMETHAZINE HCL 25 MG PO TABS: 25.0000 mg | ORAL_TABLET | Freq: Four times a day (QID) | ORAL | Status: DC | PRN

## 2017-08-18 MED ORDER — VITAMIN D 1000 UNITS PO TABS
2000.0000 [IU] | ORAL_TABLET | Freq: Every day | ORAL | Status: DC
  Administered 2017-08-18 – 2017-08-20 (×3): 2000 [IU] via ORAL
  Filled 2017-08-18 (×3): qty 2

## 2017-08-18 NOTE — H&P (Signed)
History and Physical    Rhonda Gardner YQM:[id number] DOB: April 13, 1956 DOA: 08/17/2017  Referring MD/NP/PA:   PCP: Rita Ohara, MD   Patient coming from:  The patient is coming from home.  At baseline, pt is independent for most of ADL.        Chief Complaint: Diarrhea  HPI: Rhonda Gardner is a 61 y.o. female with medical history significant of GI bleeding, peptic ulcer disease, iron deficiency anemia, bipolar disorder, left breast cancer (s/p of lumpectomy, radiation therapy and chemotherapy), who presents with diarrhea.  Patient states that she has been having intermittent diarrhea for almost a month, which has worsened in the past several days. She has at least 6 watery diarrhea each day. She has nausea, but no vomiting or abdominal pain. Denies fever or chills. Patient states that she used clindamycin due to hemorrhagic peptic ulcer disease recently. Currently patient has lightheadedness. No fall. Denies chest pain, shortness breath, cough, symptoms of UTI or unilateral weakness. Patient states that pt was seen by GI, had positive C. difficile study. She was started with Flagyl by GI doctor yesterday. Patient presents to the emergency room today because she feels like she is getting dehydrated.    ED Course: pt was found to have WBC 18.0, lactic acid 1.0, potassium is 3.3, creatinine normal, lipase is 23, temperature normal, soft blood pressure, no tachycardia, no tachypnea, O2 sat are 96% on room air. Patient is placed on MedSurg bed for observation.  Review of Systems:   General: no fevers, chills, no body weight gain, has poor appetite, has fatigue HEENT: no blurry vision, hearing changes or sore throat Respiratory: no dyspnea, coughing, wheezing CV: no chest pain, no palpitations GI: has nausea and diarrhea, no constipation, vomiting, bdominal pain, GU: no dysuria, burning on urination, increased urinary frequency, hematuria  Ext: no leg edema Neuro: no unilateral  weakness, numbness, or tingling, no vision change or hearing loss Skin: no rash, no skin tear. MSK: No muscle spasm, no deformity, no limitation of range of movement in spin Heme: No easy bruising.  Travel history: No recent long distant travel.  Allergy:  Allergies  Allergen Reactions  . Adhesive [Tape] Rash    Pt is sensitive to any kind of adhesive tape products.    Past Medical History:  Diagnosis Date  . Allergy 2006   chemical cauterization in spring 2006, and 2nd treatment with good results.(Dr.Shoemaker)  . Bipolar disorder ([place])   . Breast cancer ([place]) 12/2014   invasive ductal carcinoma (LEFT)  . Breast cancer of upper-outer quadrant of left female breast ([place]) 12/22/2014  . Breast hematoma 12/19/2015   had left breast aspiration of 4cm hematoma-path indicates benign hematoma  . C. difficile diarrhea   . Osteoporosis 06/20/2016   T-2.5@spine  on Dexa ([facility])  . PONV (postoperative nausea and vomiting)   . Symptomatic anemia 07/16/2017    Past Surgical History:  Procedure Laterality Date  . BREAST BIOPSY Bilateral    benign and a right stereotatic breast biopsy.  . fibroid excision  age 15   prolapsed fibroid removed  . FOOT SURGERY Left 2014   Dr. Paulla Dolly  . FOOT SURGERY Right 08/2016   Dr. Paulla Dolly --Shortening Osteotomy with Fixation 1st Metatarsal   . MOUTH SURGERY  06/2017   CYST     Social History:  reports that  has never smoked. she has never used smokeless tobacco. She reports that she drinks alcohol. She reports that she does not use drugs.  Family History:  Family History  Problem Relation Age of Onset  . Cancer Father        skin - non melanoma - farmer  . Heart disease Father   . Hypertension Mother   . Arthritis Mother        osteoarthritis  . Colon cancer Mother   . Macular degeneration Mother        wet and dry  . Cancer Mother 74       colon cancer at age 27  . Osteoporosis Sister        osteopenia (states it resolved)  . Cancer Maternal  Aunt        lung cancer  . Cancer Paternal Aunt 30       ? stomach cancer  . Autism Sister   . Mental retardation Sister   . Blindness Sister        secondary to retrolental fibroplasia  . Osteoporosis Sister   . Goiter Paternal Aunt   . Diabetes Paternal Aunt      Prior to Admission medications   Medication Sig Start Date End Date Taking? Authorizing Provider  Cholecalciferol (VITAMIN D) 2000 UNITS tablet Take 2,000 Units by mouth daily.    [provider]  dicyclomine (BENTYL) 20 MG tablet Take 1 tablet (20 mg total) by mouth 2 (two) times daily as needed for spasms (cramps). 08/14/17   Willia Craze, NP  ferrous sulfate 325 (65 FE) MG tablet Take 1 tablet (325 mg total) by mouth 3 (three) times daily with meals. Patient taking differently: Take 325 mg by mouth 2 (two) times daily with a meal.  07/19/17   Debbe Odea, MD  fluticasone (FLONASE) 50 MCG/ACT nasal spray Place 2 sprays into both nostrils daily.    [provider]  LAMICTAL 200 MG tablet Take 0.5 tablets (100 mg total) by mouth daily. 02/24/17   Rita Ohara, MD  LUTEIN-ZEAXANTHIN PO Take 1 tablet by mouth daily.    [provider]  magnesium 30 MG tablet Take 30 mg by mouth daily.    [provider]  ondansetron (ZOFRAN) 4 MG tablet Take 1 tablet (4 mg total) by mouth every 8 (eight) hours as needed for nausea or vomiting. 08/14/17   Willia Craze, NP  phenylephrine (SUDAFED PE) 10 MG TABS tablet Take 10-30 mg by mouth every 4 (four) hours as needed (congestion).    [provider]  promethazine (PHENERGAN) 25 MG tablet Take 25 mg by mouth every 6 (six) hours as needed for nausea or vomiting.    Wallene Huh, DPM  saccharomyces boulardii (FLORASTOR) 250 MG capsule Take 250 mg by mouth 2 (two) times daily.    [provider]  Zoledronic Acid (ZOMETA IV) Inject into the vein.    [provider]    Physical Exam: Vitals:   08/17/17 2215 08/17/17 2300  08/18/17 0040 08/18/17 0526  BP: (!) 115/59 107/61 (!) 96/42 (!) 91/44  Pulse: 84 90 89 80  Resp:   16 16  Temp:   (!) 100.7 F (38.2 C) 99.1 F (37.3 C)  TempSrc:   Oral Oral  SpO2: 99% 97% 98% 99%  Weight:   64.4 kg (142 lb)   Height:   5\' 6"  (1.676 m)    General: Not in acute distress. Has dry mucus and membrane HEENT:       Eyes: PERRL, EOMI, no scleral icterus.       ENT: No discharge from the ears and nose, no  pharynx injection, no tonsillar enlargement.        Neck: No JVD, no bruit, no mass felt. Heme: No neck lymph node enlargement. Cardiac: S1/S2, RRR, No murmurs, No gallops or rubs. Respiratory: No rales, wheezing, rhonchi or rubs. GI: Soft, nondistended, nontender, no rebound pain, no organomegaly, BS present. GU: No hematuria Ext: No pitting leg edema bilaterally. 2+DP/PT pulse bilaterally. Musculoskeletal: No joint deformities, No joint redness or warmth, no limitation of ROM in spin. Skin: No rashes.  Neuro: Alert, oriented X3, cranial nerves II-XII grossly intact, moves all extremities normally.   Psych: Patient is not psychotic, no suicidal or hemocidal ideation.  Labs on Admission: I have personally reviewed following labs and imaging studies  CBC: Recent Labs  Lab 08/17/17 1845 08/18/17 0039  WBC 18.0* 18.0*  HGB 11.2* 11.2*  HCT 33.9* 34.4*  MCV 89.4 90.8  PLT 297 903   Basic Metabolic Panel: Recent Labs  Lab 08/17/17 1845 08/18/17 0039  NA 130* 133*  K 3.3* 3.3*  CL 95* 104  CO2 24 22  GLUCOSE 119* 121*  BUN <5* <5*  CREATININE 1.04* 0.90  CALCIUM 7.8* 7.1*  MG  --  1.9   GFR: Estimated Creatinine Clearance: 59.9 mL/min (by C-G formula based on SCr of 0.9 mg/dL). Liver Function Tests: Recent Labs  Lab 08/17/17 1845  AST 15  ALT 9*  ALKPHOS 72  BILITOT 0.5  PROT 5.7*  ALBUMIN 2.8*   Recent Labs  Lab 08/17/17 1845  LIPASE 23   No results for input(s): AMMONIA in the last 168 hours. Coagulation Profile: No results for  input(s): INR, PROTIME in the last 168 hours. Cardiac Enzymes: No results for input(s): CKTOTAL, CKMB, CKMBINDEX, TROPONINI in the last 168 hours. BNP (last 3 results) No results for input(s): PROBNP in the last 8760 hours. HbA1C: No results for input(s): HGBA1C in the last 72 hours. CBG: No results for input(s): GLUCAP in the last 168 hours. Lipid Profile: No results for input(s): CHOL, HDL, LDLCALC, TRIG, CHOLHDL, LDLDIRECT in the last 72 hours. Thyroid Function Tests: No results for input(s): TSH, T4TOTAL, FREET4, T3FREE, THYROIDAB in the last 72 hours. Anemia Panel: No results for input(s): VITAMINB12, FOLATE, FERRITIN, TIBC, IRON, RETICCTPCT in the last 72 hours. Urine analysis:    Component Value Date/Time   LABSPEC 1.005 04/03/2015 1451   PHURINE 7.5 04/03/2015 1451   PHURINE 7.5 08/09/2007 2121   GLUCOSEU Negative 04/03/2015 1451   HGBUR Trace 04/03/2015 1451   HGBUR SMALL (A) 08/09/2007 2121   BILIRUBINUR neg 04/10/2017 0858   BILIRUBINUR Negative 04/03/2015 1451   KETONESUR Negative 04/03/2015 1451   [place] 08/09/2007 2121   PROTEINUR neg 04/10/2017 0858   PROTEINUR Negative 04/03/2015 1451   PROTEINUR NEGATIVE 08/09/2007 2121   UROBILINOGEN negative (A) 04/10/2017 0858   UROBILINOGEN 0.2 04/03/2015 1451   NITRITE neg 04/10/2017 0858   NITRITE Negative 04/03/2015 1451   NITRITE NEGATIVE 08/09/2007 2121   LEUKOCYTESUR Negative 04/10/2017 0858   LEUKOCYTESUR Small 04/03/2015 1451   Sepsis Labs: @LABRCNTIP (procalcitonin:4,lacticidven:4) ) Recent Results (from the past 240 hour(s))  Clostridium Difficile by PCR     Status: Abnormal   Collection Time: 08/14/17 12:45 PM  Result Value Ref Range Status   Toxigenic C Difficile by pcr DETECTED (A) NOT DETECT Final    Comment: . The stool sample is POSITIVE for toxigenic C. difficile.  This result is suggestive of C. difficile infection  (CDI) if accompanied by appropriate clinical  symptoms. . Simultaneous testing  does not identify a genetic marker of the hypervirulent 027/NAP1/BI strain of toxigenic  C. difficile. . This test is for use only with liquid or soft stools;  performance characteristics of other clinical specimen  types have not been established. . This assay was performed by C[employer]heid GeneXpert(R) PCR. The performance characteristics of this assay have been determined by [employer]. Performance characteristics refer to the analytical performance of the test. . For additional information, please refer to http://education.[employer].com/faq/FAQ136 (This link is being provided for informational/educational purposes only.)      Radiological Exams on Admission: No results found.   EKG:  Not done in ED, will get one.   Assessment/Plan Principal Problem:   C. difficile colitis Active Problems:   Malignant neoplasm of upper-outer quadrant of left breast in female, estrogen receptor negative ([facility])   Bipolar 1 disorder ([facility])   Iron deficiency anemia   Dehydration   Hypokalemia   C. difficile colitis and dehydration: Patient has a positive C. difficile PCR and GI doctor's office. Currently patient is hemodynamically stable. Does not meet criteria for sepsis. Lactic acid is normal. No fever. Patient denies abdominal pain. Pt is clinically dehydrated.  -will place on med-surg bed for obs - prn morphine for pain  - prn Zofran for nausea  - Blood culture x 2 - IV antibiotics: oral vancomycin - will get Procalcitonin and trend lactic acid level - IVF: 3.5 L of NS bolus in ED, followed by 125 cc/h  Malignant neoplasm of upper-outer quadrant of left breast in female, estrogen receptor negative [facility]): s/p of thrombectomy, chemotherapy and radiation therapy.. -Follow up with oncologist  Bipolar 1 disorder ([place]): -continue Lamictal  Iron deficiency anemia: Hemoglobin stable. 11.2 on admission -Continue iron supplement  Hypokalemia:  K= 3.3  on admission. - Repleted - Check Mg level   DVT ppx: SQ Lovenox Code Status: Full code Family Communication: None at bed side.       Disposition Plan:  Anticipate discharge back to previous home environment Consults called:  husband Admission status:  medical floor/obs      Date of Service 08/18/2017    Ivor Costa [facility] Pager [phone]  If 7PM-7AM, please contact night-coverage [email or web address] Password [email or web address] 08/18/2017, 6:08 AM

## 2017-08-18 NOTE — Telephone Encounter (Signed)
Spoke with patient and rescheduled appt per 11/5 sch msg.

## 2017-08-18 NOTE — Telephone Encounter (Signed)
She was admitted this weekend to [facility]. He wanted to change the pharmacy for compounding Vancomycin. He understands upon discharge she will be given prescriptions she is to continue with update instructions. Advised he did not have to get Vancomycin as a compound and can obtain it from any pharmacy.

## 2017-08-18 NOTE — Telephone Encounter (Signed)
Her chart indicates she has been admitted to the hospital.

## 2017-08-18 NOTE — Progress Notes (Signed)
Pt arrived to floor via stretcher and assisted to bed safely via assist x 1. Ambulatory and steady. bm noted per pt to be "yellow mucus this last time, but didn't start out that way." Spouse at bedside and pt mentioned that she had her "imodium that she had been taking " to help with the diarrhea. She also has her lamitctal 100mg  that she takes as she "cannot take the generic" brand. T/c placed and spoke with Anda Kraft who suggests that provider is notified, pills are verified by patient and sent down to pharmacy by nursing staff which was completed. Pt ambulatory and steady. Skin intact. callbell within reach. Will continue to moniotrl

## 2017-08-18 NOTE — Progress Notes (Signed)
PROGRESS NOTE    Rhonda Gardner  KWI:[id number] DOB: 05-15-1956 DOA: 08/17/2017 PCP: Rita Ohara, MD   Outpatient Specialists:     Brief Narrative:  Patient states that she has been having intermittent diarrhea for almost a month, which has worsened in the past several days. She has at least 6 watery diarrhea each day. She has nausea, but no vomiting or abdominal pain. Denies fever or chills. Patient states that she used clindamycin due to hemorrhagic peptic ulcer disease recently. Currently patient has lightheadedness. No fall. Denies chest pain, shortness breath, cough, symptoms of UTI or unilateral weakness. Patient states that pt was seen by GI, had positive C. difficile study. She was started with Flagyl by GI doctor yesterday. Patient presents to the emergency room today because she feels like she is getting dehydrated.    Assessment & Plan:   Principal Problem:   C. difficile colitis Active Problems:   Malignant neoplasm of upper-outer quadrant of left breast in female, estrogen receptor negative ([place])   Bipolar 1 disorder (HCC)   Iron deficiency anemia   Dehydration   Hypokalemia   C. difficile colitis and dehydration:  -s/p clindamycin - prn Zofran for nausea  - Blood culture x 2 - oral vancomycin - IVF -daily labs  Malignant neoplasm of upper-outer quadrant of left breast in female, estrogen receptor negative [facility]): s/p of thrombectomy, chemotherapy and radiation therapy.. -Follow up with oncologist  Bipolar 1 disorder ([place]): -continue Lamictal  Iron deficiency anemia:  -Continue iron supplement  Hypokalemia:  - Repleted      DVT prophylaxis:  Lovenox   Code Status: Full Code  Family Communication: none  Disposition Plan:  Home once tolerating food, afebrile and decreased diarrhea   Consultants:      Subjective: Still with yellowish stools- very watery  Objective: Vitals:   08/17/17 2300 08/18/17 0040 08/18/17 0526  08/18/17 0600  BP: 107/61 (!) 96/42 (!) 91/44 (!) 90/51  Pulse: 90 89 80 86  Resp:  16 16   Temp:  (!) 100.7 F (38.2 C) 99.1 F (37.3 C)   TempSrc:  Oral Oral   SpO2: 97% 98% 99%   Weight:  64.4 kg (142 lb)    Height:  5\' 6"  (1.676 m)      Intake/Output Summary (Last 24 hours) at 08/18/2017 0837 Last data filed at 08/18/2017 0700 Gross per 24 hour  Intake 4550 ml  Output -  Net 4550 ml   Filed Weights   08/17/17 1844 08/18/17 0040  Weight: 64.4 kg (142 lb) 64.4 kg (142 lb)    Examination:  General exam: Appears calm and comfortable  Respiratory system: Clear to auscultation. Respiratory effort normal. Cardiovascular system: S1 & S2 heard, RRR. No JVD, murmurs, rubs, gallops or clicks. No pedal edema. Gastrointestinal system: +BS, soft, mildly tender Central nervous system: Alert and oriented. No focal neurological deficits. Extremities: Symmetric 5 x 5 power. Skin: No rashes, lesions or ulcers Psychiatry: Judgement and insight appear normal. Mood & affect appropriate.     Data Reviewed: I have personally reviewed following labs and imaging studies  CBC: Recent Labs  Lab 08/17/17 1845 08/18/17 0039  WBC 18.0* 18.0*  HGB 11.2* 11.2*  HCT 33.9* 34.4*  MCV 89.4 90.8  PLT 297 242   Basic Metabolic Panel: Recent Labs  Lab 08/17/17 1845 08/18/17 0039  NA 130* 133*  K 3.3* 3.3*  CL 95* 104  CO2 24 22  GLUCOSE 119* 121*  BUN <5* <5*  CREATININE 1.04*  0.90  CALCIUM 7.8* 7.1*  MG  --  1.9   GFR: Estimated Creatinine Clearance: 59.9 mL/min (by C-G formula based on SCr of 0.9 mg/dL). Liver Function Tests: Recent Labs  Lab 08/17/17 1845  AST 15  ALT 9*  ALKPHOS 72  BILITOT 0.5  PROT 5.7*  ALBUMIN 2.8*   Recent Labs  Lab 08/17/17 1845  LIPASE 23   No results for input(s): AMMONIA in the last 168 hours. Coagulation Profile: No results for input(s): INR, PROTIME in the last 168 hours. Cardiac Enzymes: No results for input(s): CKTOTAL, CKMB,  CKMBINDEX, TROPONINI in the last 168 hours. BNP (last 3 results) No results for input(s): PROBNP in the last 8760 hours. HbA1C: No results for input(s): HGBA1C in the last 72 hours. CBG: No results for input(s): GLUCAP in the last 168 hours. Lipid Profile: No results for input(s): CHOL, HDL, LDLCALC, TRIG, CHOLHDL, LDLDIRECT in the last 72 hours. Thyroid Function Tests: No results for input(s): TSH, T4TOTAL, FREET4, T3FREE, THYROIDAB in the last 72 hours. Anemia Panel: No results for input(s): VITAMINB12, FOLATE, FERRITIN, TIBC, IRON, RETICCTPCT in the last 72 hours. Urine analysis:    Component Value Date/Time   LABSPEC 1.005 04/03/2015 1451   PHURINE 7.5 04/03/2015 1451   PHURINE 7.5 08/09/2007 2121   GLUCOSEU Negative 04/03/2015 1451   HGBUR Trace 04/03/2015 1451   HGBUR SMALL (A) 08/09/2007 2121   BILIRUBINUR neg 04/10/2017 0858   BILIRUBINUR Negative 04/03/2015 1451   KETONESUR Negative 04/03/2015 1451   [place] 08/09/2007 2121   PROTEINUR neg 04/10/2017 0858   PROTEINUR Negative 04/03/2015 1451   PROTEINUR NEGATIVE 08/09/2007 2121   UROBILINOGEN negative (A) 04/10/2017 0858   UROBILINOGEN 0.2 04/03/2015 1451   NITRITE neg 04/10/2017 0858   NITRITE Negative 04/03/2015 1451   NITRITE NEGATIVE 08/09/2007 2121   LEUKOCYTESUR Negative 04/10/2017 0858   LEUKOCYTESUR Small 04/03/2015 1451     ) Recent Results (from the past 240 hour(s))  Clostridium Difficile by PCR     Status: Abnormal   Collection Time: 08/14/17 12:45 PM  Result Value Ref Range Status   Toxigenic C Difficile by pcr DETECTED (A) NOT DETECT Final    Comment: . The stool sample is POSITIVE for toxigenic C. difficile.  This result is suggestive of C. difficile infection  (CDI) if accompanied by appropriate clinical symptoms. . Simultaneous testing does not identify a genetic marker of the hypervirulent 027/NAP1/BI strain of toxigenic  C. difficile. . This test is for use only with liquid  or soft stools;  performance characteristics of other clinical specimen  types have not been established. . This assay was performed by Cepheid GeneXpert(R) PCR. The performance characteristics of this assay have been determined by [employer]. Performance characteristics refer to the analytical performance of the test. . For additional information, please refer to http://education.[facility].com/faq/FAQ136 (This link is being provided for informational/educational purposes only.)       Anti-infectives (From admission, onward)   Start     Dose/Rate Route Frequency Ordered Stop   08/17/17 2200  vancomycin (VANCOCIN) 50 mg/mL oral solution 125 mg  Status:  Discontinued     125 mg Oral 4 times daily 08/17/17 2115 08/17/17 2115   08/17/17 2200  vancomycin (VANCOCIN) 50 mg/mL oral solution 125 mg     125 mg Oral 4 times daily 08/17/17 2115 08/27/17 2159       Radiology Studies: No results found.      Scheduled Meds: . cholecalciferol  2,000 Units Oral  Daily  . enoxaparin (LOVENOX) injection  40 mg Subcutaneous Q24H  . ferrous sulfate  325 mg Oral TID WC  . fluticasone  2 spray Each Nare Daily  . lamoTRIgine  100 mg Oral Daily  . magnesium oxide  200 mg Oral Daily  . saccharomyces boulardii  250 mg Oral BID  . vancomycin  125 mg Oral QID   Continuous Infusions: . sodium chloride 1,000 mL (08/18/17 0026)     LOS: 0 days    Time spent: 35 min    [place], DO [facility] Pager [phone]  If 7PM-7AM, please contact night-coverage [email or web address] Password [facility] 08/18/2017, 8:37 AM

## 2017-08-19 ENCOUNTER — Other Ambulatory Visit: Payer: Self-pay

## 2017-08-19 DIAGNOSIS — E860 Dehydration: Secondary | ICD-10-CM | POA: Diagnosis present

## 2017-08-19 DIAGNOSIS — E876 Hypokalemia: Secondary | ICD-10-CM | POA: Diagnosis present

## 2017-08-19 DIAGNOSIS — F319 Bipolar disorder, unspecified: Secondary | ICD-10-CM | POA: Diagnosis present

## 2017-08-19 DIAGNOSIS — M810 Age-related osteoporosis without current pathological fracture: Secondary | ICD-10-CM | POA: Diagnosis present

## 2017-08-19 DIAGNOSIS — Z8711 Personal history of peptic ulcer disease: Secondary | ICD-10-CM | POA: Diagnosis not present

## 2017-08-19 DIAGNOSIS — E871 Hypo-osmolality and hyponatremia: Secondary | ICD-10-CM | POA: Diagnosis present

## 2017-08-19 DIAGNOSIS — A0472 Enterocolitis due to Clostridium difficile, not specified as recurrent: Secondary | ICD-10-CM | POA: Diagnosis present

## 2017-08-19 DIAGNOSIS — Z853 Personal history of malignant neoplasm of breast: Secondary | ICD-10-CM | POA: Diagnosis not present

## 2017-08-19 DIAGNOSIS — Z79899 Other long term (current) drug therapy: Secondary | ICD-10-CM | POA: Diagnosis not present

## 2017-08-19 DIAGNOSIS — D509 Iron deficiency anemia, unspecified: Secondary | ICD-10-CM | POA: Diagnosis present

## 2017-08-19 DIAGNOSIS — Z91048 Other nonmedicinal substance allergy status: Secondary | ICD-10-CM | POA: Diagnosis not present

## 2017-08-19 DIAGNOSIS — Z923 Personal history of irradiation: Secondary | ICD-10-CM | POA: Diagnosis not present

## 2017-08-19 DIAGNOSIS — Z9221 Personal history of antineoplastic chemotherapy: Secondary | ICD-10-CM | POA: Diagnosis not present

## 2017-08-19 LAB — BASIC METABOLIC PANEL
Anion gap: 6 (ref 5–15)
CALCIUM: 7.3 mg/dL — AB (ref 8.9–10.3)
CHLORIDE: 109 mmol/L (ref 101–111)
CO2: 23 mmol/L (ref 22–32)
CREATININE: 0.67 mg/dL (ref 0.44–1.00)
GFR calc Af Amer: >60 mL/min (ref >60)
GFR calc non Af Amer: >60 mL/min (ref >60)
GLUCOSE: 96 mg/dL (ref 65–99)
Potassium: 3.4 mmol/L — ABNORMAL LOW (ref 3.5–5.1)
Sodium: 138 mmol/L (ref 135–145)

## 2017-08-19 MED ORDER — POTASSIUM CHLORIDE CRYS ER 20 MEQ PO TBCR
40.0000 meq | EXTENDED_RELEASE_TABLET | Freq: Once | ORAL | Status: AC
  Administered 2017-08-19: 40 meq via ORAL
  Filled 2017-08-19: qty 2

## 2017-08-19 MED ORDER — POTASSIUM CHLORIDE IN NACL 20-0.9 MEQ/L-% IV SOLN
INTRAVENOUS | Status: DC
  Administered 2017-08-19 – 2017-08-20 (×2): via INTRAVENOUS
  Filled 2017-08-19 (×2): qty 1000

## 2017-08-19 NOTE — Progress Notes (Signed)
PROGRESS NOTE    Rhonda Gardner  SNK:[id number] DOB: 09/16/1956 DOA: 08/17/2017 PCP: Rita Ohara, MD   Outpatient Specialists:     Brief Narrative:  Patient states that she has been having intermittent diarrhea for almost a month, which has worsened in the past several days. She has at least 6 watery diarrhea each day. She has nausea, but no vomiting or abdominal pain. Denies fever or chills. Patient states that she used clindamycin due to hemorrhagic peptic ulcer disease recently. Currently patient has lightheadedness. No fall. Denies chest pain, shortness breath, cough, symptoms of UTI or unilateral weakness. Patient states that pt was seen by GI, had positive C. difficile study. She was started with Flagyl by GI doctor yesterday. Patient presents to the emergency room today because she feels like she is getting dehydrated.    Assessment & Plan:   Principal Problem:   C. difficile colitis Active Problems:   Malignant neoplasm of upper-outer quadrant of left breast in female, estrogen receptor negative ([place])   Bipolar 1 disorder (HCC)   Iron deficiency anemia   Dehydration   Hypokalemia   C. difficile colitis and dehydration:  -s/p clindamycin - prn Zofran for nausea  - Blood culture x 2 - oral vancomycin - IVF with K -daily labs  Malignant neoplasm of upper-outer quadrant of left breast in female, estrogen receptor negative ([place]): s/p of thrombectomy, chemotherapy and radiation therapy.. -Follow up with oncologist  Bipolar 1 disorder ([place]): -continue Lamictal  Iron deficiency anemia:  -Continue iron supplement  Hypokalemia:  - Repleted      DVT prophylaxis:  Lovenox   Code Status: Full Code  Family Communication: none  Disposition Plan:  Home once tolerating food, afebrile and decreased diarrhea   Consultants:      Subjective: Stools very watery still-- multiple episodes  Objective: Vitals:   08/18/17 0600 08/18/17 1449 08/18/17  2100 08/19/17 0601  BP: (!) 90/51 (!) 110/45 (!) 98/50 (!) 99/51  Pulse: 86 88 87 79  Resp:  20 19 19   Temp:  98.4 F (36.9 C) 98.7 F (37.1 C) 98.6 F (37 C)  TempSrc:  Oral Oral Oral  SpO2:   100% 99%  Weight:      Height:        Intake/Output Summary (Last 24 hours) at 08/19/2017 0820 Last data filed at 08/19/2017 0641 Gross per 24 hour  Intake 3163.25 ml  Output -  Net 3163.25 ml   Filed Weights   08/17/17 1844 08/18/17 0040  Weight: 64.4 kg (142 lb) 64.4 kg (142 lb)    Examination:  General exam: NAD Respiratory system: clear Cardiovascular system: rrr Gastrointestinal system: mildly tender Central nervous system: A+OX3-- up walking around room Skin: no rash      Data Reviewed: I have personally reviewed following labs and imaging studies  CBC: Recent Labs  Lab 08/17/17 1845 08/18/17 0039  WBC 18.0* 18.0*  HGB 11.2* 11.2*  HCT 33.9* 34.4*  MCV 89.4 90.8  PLT 297 937   Basic Metabolic Panel: Recent Labs  Lab 08/17/17 1845 08/18/17 0039 08/19/17 0552  NA 130* 133* 138  K 3.3* 3.3* 3.4*  CL 95* 104 109  CO2 24 22 23   GLUCOSE 119* 121* 96  BUN <5* <5* <5*  CREATININE 1.04* 0.90 0.67  CALCIUM 7.8* 7.1* 7.3*  MG  --  1.9  --    GFR: Estimated Creatinine Clearance: 67.4 mL/min (by C-G formula based on SCr of 0.67 mg/dL). Liver Function Tests: Recent Labs  Lab 08/17/17 1845  AST 15  ALT 9*  ALKPHOS 72  BILITOT 0.5  PROT 5.7*  ALBUMIN 2.8*   Recent Labs  Lab 08/17/17 1845  LIPASE 23   No results for input(s): AMMONIA in the last 168 hours. Coagulation Profile: No results for input(s): INR, PROTIME in the last 168 hours. Cardiac Enzymes: No results for input(s): CKTOTAL, CKMB, CKMBINDEX, TROPONINI in the last 168 hours. BNP (last 3 results) No results for input(s): PROBNP in the last 8760 hours. HbA1C: No results for input(s): HGBA1C in the last 72 hours. CBG: No results for input(s): GLUCAP in the last 168 hours. Lipid  Profile: No results for input(s): CHOL, HDL, LDLCALC, TRIG, CHOLHDL, LDLDIRECT in the last 72 hours. Thyroid Function Tests: No results for input(s): TSH, T4TOTAL, FREET4, T3FREE, THYROIDAB in the last 72 hours. Anemia Panel: No results for input(s): VITAMINB12, FOLATE, FERRITIN, TIBC, IRON, RETICCTPCT in the last 72 hours. Urine analysis:    Component Value Date/Time   LABSPEC 1.005 04/03/2015 1451   PHURINE 7.5 04/03/2015 1451   PHURINE 7.5 08/09/2007 2121   GLUCOSEU Negative 04/03/2015 1451   HGBUR Trace 04/03/2015 1451   HGBUR SMALL (A) 08/09/2007 2121   BILIRUBINUR neg 04/10/2017 0858   BILIRUBINUR Negative 04/03/2015 1451   KETONESUR Negative 04/03/2015 1451   [place] 08/09/2007 2121   PROTEINUR neg 04/10/2017 0858   PROTEINUR Negative 04/03/2015 1451   PROTEINUR NEGATIVE 08/09/2007 2121   UROBILINOGEN negative (A) 04/10/2017 0858   UROBILINOGEN 0.2 04/03/2015 1451   NITRITE neg 04/10/2017 0858   NITRITE Negative 04/03/2015 1451   NITRITE NEGATIVE 08/09/2007 2121   LEUKOCYTESUR Negative 04/10/2017 0858   LEUKOCYTESUR Small 04/03/2015 1451     ) Recent Results (from the past 240 hour(s))  Clostridium Difficile by PCR     Status: Abnormal   Collection Time: 08/14/17 12:45 PM  Result Value Ref Range Status   Toxigenic C Difficile by pcr DETECTED (A) NOT DETECT Final    Comment: . The stool sample is POSITIVE for toxigenic C. difficile.  This result is suggestive of C. difficile infection  (CDI) if accompanied by appropriate clinical symptoms. . Simultaneous testing does not identify a genetic marker of the hypervirulent 027/NAP1/BI strain of toxigenic  C. difficile. . This test is for use only with liquid or soft stools;  performance characteristics of other clinical specimen  types have not been established. . This assay was performed by Cepheid GeneXpert(R) PCR. The performance characteristics of this assay have been determined by [employer]. Performance characteristics refer to the analytical performance of the test. . For additional information, please refer to http://education.[facility].com/faq/FAQ136 (This link is being provided for informational/educational purposes only.)       Anti-infectives (From admission, onward)   Start     Dose/Rate Route Frequency Ordered Stop   08/17/17 2200  vancomycin (VANCOCIN) 50 mg/mL oral solution 125 mg  Status:  Discontinued     125 mg Oral 4 times daily 08/17/17 2115 08/17/17 2115   08/17/17 2200  vancomycin (VANCOCIN) 50 mg/mL oral solution 125 mg     125 mg Oral 4 times daily 08/17/17 2115 08/27/17 2159       Radiology Studies: No results found.      Scheduled Meds: . cholecalciferol  2,000 Units Oral Daily  . enoxaparin (LOVENOX) injection  40 mg Subcutaneous Q24H  . ferrous sulfate  325 mg Oral TID WC  . fluticasone  2 spray Each Nare Daily  . lamoTRIgine  100 mg Oral Daily  . magnesium oxide  200 mg Oral Daily  . potassium chloride  40 mEq Oral Once  . saccharomyces boulardii  250 mg Oral BID  . vancomycin  125 mg Oral QID   Continuous Infusions: . 0.9 % NaCl with KCl 20 mEq / L       LOS: 0 days    Time spent: 25 min    [place], DO [facility] Pager [phone]  If 7PM-7AM, please contact night-coverage [email or web address] Password [email or web address] 08/19/2017, 8:20 AM

## 2017-08-20 LAB — CBC
HCT: 29.2 % — ABNORMAL LOW (ref 36.0–46.0)
HEMOGLOBIN: 9.3 g/dL — AB (ref 12.0–15.0)
MCH: 29.1 pg (ref 26.0–34.0)
MCHC: 31.8 g/dL (ref 30.0–36.0)
MCV: 91.3 fL (ref 78.0–100.0)
Platelets: 297 10*3/uL (ref 150–400)
RBC: 3.2 MIL/uL — AB (ref 3.87–5.11)
RDW: 15 % (ref 11.5–15.5)
WBC: 3.6 10*3/uL — ABNORMAL LOW (ref 4.0–10.5)

## 2017-08-20 LAB — BASIC METABOLIC PANEL
ANION GAP: 6 (ref 5–15)
BUN: <5 mg/dL — ABNORMAL LOW (ref 6–20)
CHLORIDE: 110 mmol/L (ref 101–111)
CO2: 23 mmol/L (ref 22–32)
Calcium: 7.6 mg/dL — ABNORMAL LOW (ref 8.9–10.3)
Creatinine, Ser: 0.66 mg/dL (ref 0.44–1.00)
GFR calc non Af Amer: >60 mL/min (ref >60)
Glucose, Bld: 93 mg/dL (ref 65–99)
POTASSIUM: 3.7 mmol/L (ref 3.5–5.1)
SODIUM: 139 mmol/L (ref 135–145)

## 2017-08-20 MED ORDER — VANCOMYCIN 50 MG/ML ORAL SOLUTION
500.0000 mg | Freq: Four times a day (QID) | ORAL | Status: DC
  Administered 2017-08-20 (×2): 500 mg via ORAL
  Filled 2017-08-20 (×4): qty 10

## 2017-08-20 MED ORDER — VANCOMYCIN HCL 250 MG PO CAPS: 500.0000 mg | ORAL_CAPSULE | Freq: Four times a day (QID) | ORAL | 0 refills | Status: DC

## 2017-08-20 NOTE — Discharge Summary (Signed)
Physician Discharge Summary  Rhonda Gardner OMB:[id number] DOB: 11-04-1955 DOA: 08/17/2017  PCP: Rita Ohara, MD  Admit date: 08/17/2017 Discharge date: 08/20/2017   Recommendations for Outpatient Follow-Up:   PO vanc called into [facility] on [address]-- per pharmacy staff, no cost  Discharge Diagnosis:   Principal Problem:   C. difficile colitis Active Problems:   Malignant neoplasm of upper-outer quadrant of left breast in female, estrogen receptor negative ([place])   Bipolar 1 disorder ([place])   Iron deficiency anemia   Dehydration   Hypokalemia   Discharge disposition:  Home  Discharge Condition: Improved.  Diet recommendation: Regular.  Wound care: None.   History of Present Illness:   Patient states that she has been having intermittent diarrhea for almost a month,which has worsened in the past several days. She has at least 6 watery diarrhea each day. She has nausea, but no vomiting or abdominal pain. Denies fever or chills. Patient states that she used clindamycin due to hemorrhagic peptic ulcer disease recently. Currently patient has lightheadedness. No fall. Denies chest pain, shortness breath, cough, symptoms of UTI or unilateral weakness. Patient states thatpt was seen by GI, hadpositive C. difficile study. She was started withFlagylby GI doctoryesterday.Patient presents to the emergency room today because she feels like she is getting dehydrated.     Hospital Course by Problem:   C. difficile colitisanddehydration: -s/p clindamycin - prn Zofran for nausea  - Blood culture x 2 - oral vancomycin-- have increased dose to 500 -- patient much improved this PM -called prescription into [facility] for 14 days of treatment  Malignant neoplasm of upper-outer quadrant of left breast in female, estrogen receptor negative (HCC):s/p ofthrombectomy, chemotherapy and radiation therapy.. -Follow up withoncologist  Bipolar 1 disorder  ([place]): -continueLamictal  Iron deficiency anemia: -Continue iron supplement  Hypokalemia:  - Repleted      Medical Consultants:    None.   Discharge Exam:   Vitals:   08/20/17 0500 08/20/17 1413  BP: 113/61 (!) 118/59  Pulse: 71 85  Resp: 18 18  Temp: 98.5 F (36.9 C) 97.9 F (36.6 C)  SpO2: 100% 98%   Vitals:   08/19/17 1442 08/19/17 2058 08/20/17 0500 08/20/17 1413  BP: (!) 101/58 113/62 113/61 (!) 118/59  Pulse: 84 73 71 85  Resp: 18 18 18 18   Temp: 98.1 F (36.7 C) 98.5 F (36.9 C) 98.5 F (36.9 C) 97.9 F (36.6 C)  TempSrc: Oral Oral Oral Oral  SpO2: 99% 100% 100% 98%  Weight:      Height:           The results of significant diagnostics from this hospitalization (including imaging, microbiology, ancillary and laboratory) are listed below for reference.     Procedures and Diagnostic Studies:   No results found.   Labs:   Basic Metabolic Panel: Recent Labs  Lab 08/17/17 1845 08/18/17 0039 08/19/17 0552 08/20/17 0333  NA 130* 133* 138 139  K 3.3* 3.3* 3.4* 3.7  CL 95* 104 109 110  CO2 24 22 23 23   GLUCOSE 119* 121* 96 93  BUN <5* <5* <5* <5*  CREATININE 1.04* 0.90 0.67 0.66  CALCIUM 7.8* 7.1* 7.3* 7.6*  MG  --  1.9  --   --    GFR Estimated Creatinine Clearance: 67.4 mL/min (by C-G formula based on SCr of 0.66 mg/dL). Liver Function Tests: Recent Labs  Lab 08/17/17 1845  AST 15  ALT 9*  ALKPHOS 72  BILITOT 0.5  PROT 5.7*  ALBUMIN  2.8*   Recent Labs  Lab 08/17/17 1845  LIPASE 23   No results for input(s): AMMONIA in the last 168 hours. Coagulation profile No results for input(s): INR, PROTIME in the last 168 hours.  CBC: Recent Labs  Lab 08/17/17 1845 08/18/17 0039 08/20/17 0333  WBC 18.0* 18.0* 3.6*  HGB 11.2* 11.2* 9.3*  HCT 33.9* 34.4* 29.2*  MCV 89.4 90.8 91.3  PLT 297 313 297   Cardiac Enzymes: No results for input(s): CKTOTAL, CKMB, CKMBINDEX, TROPONINI in the last 168 hours. BNP: Invalid  input(s): POCBNP CBG: No results for input(s): GLUCAP in the last 168 hours. D-Dimer No results for input(s): DDIMER in the last 72 hours. Hgb A1c No results for input(s): HGBA1C in the last 72 hours. Lipid Profile No results for input(s): CHOL, HDL, LDLCALC, TRIG, CHOLHDL, LDLDIRECT in the last 72 hours. Thyroid function studies No results for input(s): TSH, T4TOTAL, T3FREE, THYROIDAB in the last 72 hours.  Invalid input(s): FREET3 Anemia work up No results for input(s): VITAMINB12, FOLATE, FERRITIN, TIBC, IRON, RETICCTPCT in the last 72 hours. Microbiology Recent Results (from the past 240 hour(s))  Clostridium Difficile by PCR     Status: Abnormal   Collection Time: 08/14/17 12:45 PM  Result Value Ref Range Status   Toxigenic C Difficile by pcr DETECTED (A) NOT DETECT Final    Comment: . The stool sample is POSITIVE for toxigenic C. difficile.  This result is suggestive of C. difficile infection  (CDI) if accompanied by appropriate clinical symptoms. . Simultaneous testing does not identify a genetic marker of the hypervirulent 027/NAP1/BI strain of toxigenic  C. difficile. . This test is for use only with liquid or soft stools;  performance characteristics of other clinical specimen  types have not been established. . This assay was performed by Cepheid GeneXpert(R) PCR. The performance characteristics of this assay have been determined by [employer]. Performance characteristics refer to the analytical performance of the test. . For additional information, please refer to http://education.[facility].com/faq/FAQ136 (This link is being provided for informational/educational purposes only.)   Culture, blood (Routine X 2) w Reflex to ID Panel     Status: None (Preliminary result)   Collection Time: 08/18/17 12:40 AM  Result Value Ref Range Status   Specimen Description BLOOD RIGHT ARM  Final   Special Requests   Final    BOTTLES DRAWN AEROBIC AND  ANAEROBIC Blood Culture adequate volume   Culture NO GROWTH 2 DAYS  Final   Report Status PENDING  Incomplete  Culture, blood (Routine X 2) w Reflex to ID Panel     Status: None (Preliminary result)   Collection Time: 08/18/17 12:45 AM  Result Value Ref Range Status   Specimen Description BLOOD RIGHT HAND  Final   Special Requests   Final    BOTTLES DRAWN AEROBIC ONLY Blood Culture adequate volume   Culture NO GROWTH 2 DAYS  Final   Report Status PENDING  Incomplete     Discharge Instructions:   Discharge Instructions    Diet general   Complete by:  As directed    Discharge instructions   Complete by:  As directed    Any issues with your antibiotics, please call [phone] and have them page me   Increase activity slowly   Complete by:  As directed      Allergies as of 08/20/2017      Reactions   Adhesive [tape] Rash   Pt is sensitive to any kind of adhesive tape products.  Medication List    TAKE these medications   dicyclomine 20 MG tablet Commonly known as:  BENTYL Take 1 tablet (20 mg total) by mouth 2 (two) times daily as needed for spasms (cramps).   ferrous sulfate 325 (65 FE) MG tablet Take 1 tablet (325 mg total) by mouth 3 (three) times daily with meals. What changed:  when to take this   fluticasone 50 MCG/ACT nasal spray Commonly known as:  FLONASE Place 2 sprays into both nostrils daily.   LAMICTAL 200 MG tablet Generic drug:  lamoTRIgine Take 0.5 tablets (100 mg total) by mouth daily.   LUTEIN-ZEAXANTHIN PO Take 1 tablet by mouth daily.   magnesium 30 MG tablet Take 30 mg by mouth daily.   ondansetron 4 MG tablet Commonly known as:  ZOFRAN Take 1 tablet (4 mg total) by mouth every 8 (eight) hours as needed for nausea or vomiting.   phenylephrine 10 MG Tabs tablet Commonly known as:  SUDAFED PE Take 10-30 mg by mouth every 4 (four) hours as needed (congestion).   promethazine 25 MG tablet Commonly known as:  PHENERGAN Take 25 mg by  mouth every 6 (six) hours as needed for nausea or vomiting.   saccharomyces boulardii 250 MG capsule Commonly known as:  FLORASTOR Take 250 mg by mouth 2 (two) times daily.   vancomycin 250 MG capsule Commonly known as:  VANCOCIN Take 2 capsules (500 mg total) 4 (four) times daily by mouth.   Vitamin D 2000 units tablet Take 2,000 Units by mouth daily.   ZOMETA IV Inject every 3 (three) months into the vein.      Follow-up Information    Rita Ohara, MD Follow up in 1 week(s).   Specialty:  Family Medicine Contact information: [address] [place] [address] [phone]            Time coordinating discharge: 35 min  Signed:  JESSICA Alison Stalling   [facility] 08/20/2017, 3:54 PM

## 2017-08-20 NOTE — Progress Notes (Signed)
Pt being discharged home via wheelchair with family. Pt alert and oriented x4. VSS. Pt c/o no pain at this time. No signs of respiratory distress. Education complete and care plans resolved. IV removed with catheter intact and pt tolerated well. No further issues at this time. Pt to follow up with PCP. Kathleena Freeman R, RN 

## 2017-08-20 NOTE — Progress Notes (Signed)
PROGRESS NOTE    Rhonda Gardner  XBL:[id number] DOB: [birth year] DOA: 08/17/2017 PCP: Rita Ohara, MD   Outpatient Specialists:     Brief Narrative:  Patient states that she has been having intermittent diarrhea for almost a month, which has worsened in the past several days. She has at least 6 watery diarrhea each day. She has nausea, but no vomiting or abdominal pain. Denies fever or chills. Patient states that she used clindamycin due to hemorrhagic peptic ulcer disease recently. Currently patient has lightheadedness. No fall. Denies chest pain, shortness breath, cough, symptoms of UTI or unilateral weakness. Patient states that pt was seen by GI, had positive C. difficile study. She was started with Flagyl by GI doctor yesterday. Patient presents to the emergency room today because she feels like she is getting dehydrated.    Assessment & Plan:   Principal Problem:   C. difficile colitis Active Problems:   Malignant neoplasm of upper-outer quadrant of left breast in female, estrogen receptor negative ([place])   Bipolar 1 disorder (HCC)   Iron deficiency anemia   Dehydration   Hypokalemia   C. difficile colitis and dehydration:  -s/p clindamycin - prn Zofran for nausea  - Blood culture x 2 - oral vancomycin-- have increased dose to 500 as patient still with very liquidy stools, no form -daily labs  Malignant neoplasm of upper-outer quadrant of left breast in female, estrogen receptor negative ([place]): s/p of thrombectomy, chemotherapy and radiation therapy.. -Follow up with oncologist  Bipolar 1 disorder ([place]): -continue Lamictal  Iron deficiency anemia:  -Continue iron supplement  Hypokalemia:  - Repleted      DVT prophylaxis:  Lovenox   Code Status: Full Code  Family Communication: none  Disposition Plan:  Home once decreased diarrhea   Consultants:      Subjective: Greenish colored stool-- mainly liquid, no form Some nausea this AM No  fever  Objective: Vitals:   08/19/17 1442 08/19/17 2058 08/20/17 0500 08/20/17 1413  BP: (!) 101/58 113/62 113/61 (!) 118/59  Pulse: 84 73 71 85  Resp: 18 18 18 18   Temp: 98.1 F (36.7 C) 98.5 F (36.9 C) 98.5 F (36.9 C) 97.9 F (36.6 C)  TempSrc: Oral Oral Oral Oral  SpO2: 99% 100% 100% 98%  Weight:      Height:        Intake/Output Summary (Last 24 hours) at 08/20/2017 1514 Last data filed at 08/20/2017 1415 Gross per 24 hour  Intake 1480 ml  Output -  Net 1480 ml   Filed Weights   08/17/17 1844 08/18/17 0040  Weight: 64.4 kg (142 lb) 64.4 kg (142 lb)    Examination:  General exam: awake, NAD Respiratory system: clear Cardiovascular system: rrr Gastrointestinal system: +BS Central nervous system: alert       Data Reviewed: I have personally reviewed following labs and imaging studies  CBC: Recent Labs  Lab 08/17/17 1845 08/18/17 0039 08/20/17 0333  WBC 18.0* 18.0* 3.6*  HGB 11.2* 11.2* 9.3*  HCT 33.9* 34.4* 29.2*  MCV 89.4 90.8 91.3  PLT 297 313 300   Basic Metabolic Panel: Recent Labs  Lab 08/17/17 1845 08/18/17 0039 08/19/17 0552 08/20/17 0333  NA 130* 133* 138 139  K 3.3* 3.3* 3.4* 3.7  CL 95* 104 109 110  CO2 24 22 23 23   GLUCOSE 119* 121* 96 93  BUN <5* <5* <5* <5*  CREATININE 1.04* 0.90 0.67 0.66  CALCIUM 7.8* 7.1* 7.3* 7.6*  MG  --  1.9  --   --  GFR: Estimated Creatinine Clearance: 67.4 mL/min (by C-G formula based on SCr of 0.66 mg/dL). Liver Function Tests: Recent Labs  Lab 08/17/17 1845  AST 15  ALT 9*  ALKPHOS 72  BILITOT 0.5  PROT 5.7*  ALBUMIN 2.8*   Recent Labs  Lab 08/17/17 1845  LIPASE 23   No results for input(s): AMMONIA in the last 168 hours. Coagulation Profile: No results for input(s): INR, PROTIME in the last 168 hours. Cardiac Enzymes: No results for input(s): CKTOTAL, CKMB, CKMBINDEX, TROPONINI in the last 168 hours. BNP (last 3 results) No results for input(s): PROBNP in the last 8760  hours. HbA1C: No results for input(s): HGBA1C in the last 72 hours. CBG: No results for input(s): GLUCAP in the last 168 hours. Lipid Profile: No results for input(s): CHOL, HDL, LDLCALC, TRIG, CHOLHDL, LDLDIRECT in the last 72 hours. Thyroid Function Tests: No results for input(s): TSH, T4TOTAL, FREET4, T3FREE, THYROIDAB in the last 72 hours. Anemia Panel: No results for input(s): VITAMINB12, FOLATE, FERRITIN, TIBC, IRON, RETICCTPCT in the last 72 hours. Urine analysis:    Component Value Date/Time   LABSPEC 1.005 04/03/2015 1451   PHURINE 7.5 04/03/2015 1451   PHURINE 7.5 08/09/2007 2121   GLUCOSEU Negative 04/03/2015 1451   HGBUR Trace 04/03/2015 1451   HGBUR SMALL (A) 08/09/2007 2121   BILIRUBINUR neg 04/10/2017 0858   BILIRUBINUR Negative 04/03/2015 1451   KETONESUR Negative 04/03/2015 1451   [place] 08/09/2007 2121   PROTEINUR neg 04/10/2017 0858   PROTEINUR Negative 04/03/2015 1451   PROTEINUR NEGATIVE 08/09/2007 2121   UROBILINOGEN negative (A) 04/10/2017 0858   UROBILINOGEN 0.2 04/03/2015 1451   NITRITE neg 04/10/2017 0858   NITRITE Negative 04/03/2015 1451   NITRITE NEGATIVE 08/09/2007 2121   LEUKOCYTESUR Negative 04/10/2017 0858   LEUKOCYTESUR Small 04/03/2015 1451     ) Recent Results (from the past 240 hour(s))  Clostridium Difficile by PCR     Status: Abnormal   Collection Time: 08/14/17 12:45 PM  Result Value Ref Range Status   Toxigenic C Difficile by pcr DETECTED (A) NOT DETECT Final    Comment: . The stool sample is POSITIVE for toxigenic C. difficile.  This result is suggestive of C. difficile infection  (CDI) if accompanied by appropriate clinical symptoms. . Simultaneous testing does not identify a genetic marker of the hypervirulent 027/NAP1/BI strain of toxigenic  C. difficile. . This test is for use only with liquid or soft stools;  performance characteristics of other clinical specimen  types have not been established. . This  assay was performed by Cepheid GeneXpert(R) PCR. The performance characteristics of this assay have been determined by [employer]. Performance characteristics refer to the analytical performance of the test. . For additional information, please refer to http://education.[facility].com/faq/FAQ136 (This link is being provided for informational/educational purposes only.)   Culture, blood (Routine X 2) w Reflex to ID Panel     Status: None (Preliminary result)   Collection Time: 08/18/17 12:40 AM  Result Value Ref Range Status   Specimen Description BLOOD RIGHT ARM  Final   Special Requests   Final    BOTTLES DRAWN AEROBIC AND ANAEROBIC Blood Culture adequate volume   Culture NO GROWTH 2 DAYS  Final   Report Status PENDING  Incomplete  Culture, blood (Routine X 2) w Reflex to ID Panel     Status: None (Preliminary result)   Collection Time: 08/18/17 12:45 AM  Result Value Ref Range Status   Specimen Description BLOOD RIGHT HAND  Final   Special Requests   Final    BOTTLES DRAWN AEROBIC ONLY Blood Culture adequate volume   Culture NO GROWTH 2 DAYS  Final   Report Status PENDING  Incomplete      Anti-infectives (From admission, onward)   Start     Dose/Rate Route Frequency Ordered Stop   08/20/17 1000  vancomycin (VANCOCIN) 50 mg/mL oral solution 500 mg     500 mg Oral 4 times daily 08/20/17 0802 08/27/17 2159   08/17/17 2200  vancomycin (VANCOCIN) 50 mg/mL oral solution 125 mg  Status:  Discontinued     125 mg Oral 4 times daily 08/17/17 2115 08/17/17 2115   08/17/17 2200  vancomycin (VANCOCIN) 50 mg/mL oral solution 125 mg  Status:  Discontinued     125 mg Oral 4 times daily 08/17/17 2115 08/20/17 0802       Radiology Studies: No results found.      Scheduled Meds: . cholecalciferol  2,000 Units Oral Daily  . enoxaparin (LOVENOX) injection  40 mg Subcutaneous Q24H  . ferrous sulfate  325 mg Oral TID WC  . fluticasone  2 spray Each Nare Daily  .  lamoTRIgine  100 mg Oral Daily  . magnesium oxide  200 mg Oral Daily  . saccharomyces boulardii  250 mg Oral BID  . vancomycin  500 mg Oral QID   Continuous Infusions:    LOS: 1 day    Time spent: 25 min    [place], DO [facility] Pager [phone]  If 7PM-7AM, please contact night-coverage [email or web address] Password [facility] 08/20/2017, 3:14 PM

## 2017-08-20 NOTE — Discharge Instructions (Signed)
Clostridium Difficile Infection  Clostridium difficile (C. difficile or C. diff) infection is a condition that causes inflammation of the large intestine (colon). This condition can result in damage to the lining of your colon and may lead to colitis. This infection can be passed from person to person (is contagious).  What are the causes?  C. diff is a bacterium that is normally found in the colon. This infection is caused when the balance of C. diff is changed and there is an overgrowth of C. diff. This is often caused by antibiotic use.  What increases the risk?  This condition is more likely to develop in people who:  · Take antibiotic medicines.  · Take a certain type of medicine called proton pump inhibitors over a long period of time (chronic use).  · Are older.  · Have had a C. diff infection before.  · Have serious underlying conditions, such as colon cancer.  · Are in the hospital.  · Have a weak defense (immune) system.  · Live in a place where there is a lot of contact with others, such as a nursing home.  · Have had gastrointestinal (GI) tract surgery.    What are the signs or symptoms?  Symptoms of this condition include:  · Diarrhea. This may be bloody, watery, or yellow or green in color.  · Fever.  · Fatigue.  · Loss of appetite.  · Nausea.  · Swelling, pain, or tenderness in the abdomen.  · Dehydration. Dehydration can cause you to be tired and thirsty, have a dry mouth, and urinate less frequently.    How is this diagnosed?  This condition is diagnosed with a medical history and physical exam. You may also have tests, including:  · A test that checks for C. diff in your stool.  · Blood tests.  · A sigmoidoscopy or colonoscopy to look at your colon. These procedures involve passing an instrument through your rectum to look at the inside of your colon.    How is this treated?  Treatment for this condition includes:  · Antibiotics that keep C. diff from growing.  · Stopping the antibiotics you were  on before the C. diff infection began. Only do this as told by your health care provider.  · Fluids through an IV tube, if you are dehydrated.  · Surgery to remove the infected part of the colon. This is rare.    Follow these instructions at home:  Eating and drinking  · Drink enough fluid to keep your urine clear or pale yellow. Avoid milk, caffeine, and alcohol.  · Follow specific rehydration instructions as told by your health care provider.  · Eat small, frequent meals instead of large meals.  Medicines  · Take your antibiotic medicine as told by your health care provider. Do not stop taking the antibiotic even if you start to feel better unless your health care provider told you to do that.  · Take over-the-counter and prescription medicines only as told by your health care provider.  · Do not use medicines to help with diarrhea.  General instructions  · Wash your hands thoroughly before you prepare food and after you use the bathroom. Make sure people who live with you also wash their hands often.  · Clean surfaces that you touch with a product that contains chlorine bleach.  · Keep all follow-up visits as told by your health care provider. This is important.  Contact a health care provider if:  ·   Your symptoms do not get better with treatment.  · Your symptoms get worse with treatment.  · Your symptoms go away and then return.  · You have a fever.  · You have new symptoms.  Get help right away if:  · You have increasing pain or tenderness in your abdomen.  · You have stool that is mostly bloody, or your stool looks dark black and tarry.  · You cannot eat or drink without vomiting.  · You have signs of dehydration, such as:  ? Dark urine, very little urine, or no urine.  ? Cracked lips.  ? Not making tears when you cry.  ? Dry mouth.  ? Sunken eyes.  ? Sleepiness.  ? Weakness.  ? Dizziness.  This information is not intended to replace advice given to you by your health care provider. Make sure you discuss any  questions you have with your health care provider.  Document Released: 07/10/2005 Document Revised: 03/07/2016 Document Reviewed: 04/03/2015  Elsevier Interactive Patient Education © 2017 Elsevier Inc.

## 2017-08-20 NOTE — Care Management (Signed)
Ambrose Mantle, RN        This drug is not covered however patient can pay out of pocket and it will be 12.83 for a 30 day supply of the Vancomycin 50 mg/mL oral solution 500 mg.   Previous Messages    ----- Message -----  From: Robyne Askew, RN  Sent: 08/20/2017  9:59 AM  To: Sheliah Plane Ip [facility] Case Mgr Assistant  Subject: Benefit check                   Vancomycin 50 mg/mL oral solution 500 mg 08/20/2017 08/27/2017  Sig - Route: Take 10 mLs (500 mg total) 4 (four) times daily by mouth. - Oral   28 doses

## 2017-08-23 LAB — CULTURE, BLOOD (ROUTINE X 2)
CULTURE: NO GROWTH
CULTURE: NO GROWTH
Special Requests: ADEQUATE
Special Requests: ADEQUATE

## 2017-08-28 ENCOUNTER — Ambulatory Visit (INDEPENDENT_AMBULATORY_CARE_PROVIDER_SITE_OTHER): Payer: BLUE CROSS/BLUE SHIELD | Admitting: Nurse Practitioner

## 2017-08-28 ENCOUNTER — Encounter: Payer: Self-pay | Admitting: Nurse Practitioner

## 2017-08-28 VITALS — BP 94/52 | HR 54 | Ht 66.0 in | Wt 143.0 lb

## 2017-08-28 DIAGNOSIS — K219 Gastro-esophageal reflux disease without esophagitis: Secondary | ICD-10-CM

## 2017-08-28 DIAGNOSIS — B9689 Other specified bacterial agents as the cause of diseases classified elsewhere: Secondary | ICD-10-CM | POA: Diagnosis not present

## 2017-08-28 DIAGNOSIS — K269 Duodenal ulcer, unspecified as acute or chronic, without hemorrhage or perforation: Secondary | ICD-10-CM | POA: Diagnosis not present

## 2017-08-28 DIAGNOSIS — A498 Other bacterial infections of unspecified site: Secondary | ICD-10-CM

## 2017-08-28 NOTE — Patient Instructions (Signed)
If you are age 63 or older, your body mass index should be between 23-30. Your Body mass index is 23.08 kg/m. If this is out of the aforementioned range listed, please consider follow up with your Primary Care Provider.  If you are age 79 or younger, your body mass index should be between 19-25. Your Body mass index is 23.08 kg/m. If this is out of the aformentioned range listed, please consider follow up with your Primary Care Provider.   Follow up as needed.  Thank you for choosing me and [facility].   Tye Savoy, NP

## 2017-08-28 NOTE — Progress Notes (Signed)
Chief Complaint:  Hospital follow up  HPI: Patient is a 61 old female who I saw the beginning of this month for a hospital follow-up.  She had been hospitalized in October with symptomatic anemia requiring transfusion.  She had been taking anti-inflammatories.  Inpatient EGD remarkable for duodenal ulcer with active bleeding from a visible vessel, she is status post epi injection and cautery.  H. pylori IgG antibody was just at the cut off for being negative. Patient felt terrible at our last visit.   Her appetite was poor, she had been having crampy diarrhea.  She had taken clindamycin a few weeks prior for some dental work. I  Checked stool for C. difficile and it came back positive.  She was to be started on vancomycin but in the interim ended up being hospitalized at [facility] with dehydration.  Patient is now back for hospital follow-up.    She feels great.  Stools are soft, no further diarrhea.  No abdominal pain.  She is taking the vancomycin as directed.  She never did go back on a PPI but taking twice daily Pepcid instead for the duodenal ulcer.  Patient felt her abdominal symptoms were related to the PPI she was discharged home from the hospital with in October.  I do think it was probably the C. difficile that was causing her problems,  not the PPI. At any rate she is doing well with  H2 blocker which is also helping her acid reflux    Past Medical History:  Diagnosis Date  . Allergy 2006   chemical cauterization in spring 2006, and 2nd treatment with good results.(Dr.Shoemaker)  . Bipolar disorder ([place])   . Bleeding ulcer   . Breast cancer ([place]) 12/2014   invasive ductal carcinoma (LEFT)  . Breast cancer of upper-outer quadrant of left female breast ([place]) 12/22/2014  . Breast hematoma 12/19/2015   had left breast aspiration of 4cm hematoma-path indicates benign hematoma  . C. difficile diarrhea   . Osteoporosis 06/20/2016   T-2.5@spine  on Dexa ([place])  . PONV (postoperative  nausea and vomiting)   . Symptomatic anemia 07/16/2017    Patient's surgical history, family medical history, social history, medications and allergies were all reviewed in Epic    Physical Exam: BP (!) 94/52   Pulse (!) 54   Ht 5\' 6"  (1.676 m)   Wt 143 lb (64.9 kg)   BMI 23.08 kg/m   GENERAL:  Well developed white female in NAD PSYCH: :Pleasant, cooperative, normal affect EENT:  conjunctiva pink, mucous membranes moist, neck supple without masses CARDIAC:  RRR, no murmur heard, no peripheral edema PULM: Normal respiratory effort, lungs CTA bilaterally, no wheezing ABDOMEN:  Nondistended, soft, nontender. No obvious masses, no hepatomegaly,  normal bowel sounds SKIN:  turgor, no lesions seen Musculoskeletal:  Normal muscle tone, normal strength NEURO: Alert and oriented x 3, no focal neurologic deficits   ASSESSMENT and PLAN:  1. Pleasant 61 year old female with c-diff diarrhea requiring hospitalization for dehydration.  For hospital follow-up.  She is still on vancomycin.  Diarrhea has completely resolved.  She feels great.  No abdominal pain.  -complete course of Vanco -She is taking Florastor.  Would continue for an additional month after completion of vancomycin.  -She is using different bathroom facilities at home to prevent transmission -She and her husband had a lot of questions about C. Difficile such as risk for recurrent infection, dietary limitations, etc..   2. Duodenal ulcer.  Ideally  would not be on a PPI or H2 blocker with C. difficile infection but we she had a recent UGI bleed secondary to a duodenal ulcer with visible vessel.   -patient felt the PPI was causing abdominal discomfort and bloating.  I think symptoms were more likely related to undiagnosed C. difficile infection rather than the PPI. Will continue the Pepcid twice daily since she is tolerating it and it helps her reflux symptoms as well. -complete total of 30 days of BID H2 blocker, then take QD for 30  days. Following that can discontinue unless needed for GERD  3. GERD, asymptomatic on H2 blocker.   4. Colon cancer screening. Received records from Dr. Lavina Hamman  Colonoscopy 07/08/16 by Dr. Chucky May. Extent of exam to the cecum. Bowel prep score was 6. Findings included internal hemorrhoids, no other abnormalities. Mother had colon cancer but was in her 25's when diagnosed so this makes patient average risk. Next colonoscopy probably at 10 year mark but will defer to Dr. Fuller Plan her primary GI  Tye Savoy , NP 08/28/2017, 9:26 AM

## 2017-08-29 NOTE — Progress Notes (Signed)
Reviewed and agree with management plan. Would use PPI or H2RA daily long term for history of DU with bleed.  10 year interval screening colon is appropriate.   Pricilla Riffle. Fuller Plan, MD [facility]

## 2017-09-02 ENCOUNTER — Telehealth: Payer: Self-pay

## 2017-09-02 NOTE — Telephone Encounter (Signed)
Left a message to call back.

## 2017-09-02 NOTE — Telephone Encounter (Signed)
-----   Message from Willia Craze, NP sent at 08/29/2017  5:46 PM EST ----- Eustaquio Maize, please let patient know that I spoke with Dr. Fuller Plan and thinks a 10 year colonoscopy recall is appropriate (10 years from last). Also, he prefers her to remain on Pepcid long term given hx of bleeding ulcer.  Thanks

## 2017-09-12 ENCOUNTER — Ambulatory Visit (HOSPITAL_BASED_OUTPATIENT_CLINIC_OR_DEPARTMENT_OTHER): Payer: BLUE CROSS/BLUE SHIELD

## 2017-09-12 ENCOUNTER — Other Ambulatory Visit (HOSPITAL_BASED_OUTPATIENT_CLINIC_OR_DEPARTMENT_OTHER): Payer: BLUE CROSS/BLUE SHIELD

## 2017-09-12 VITALS — BP 111/64 | HR 72 | Temp 98.8°F | Resp 18 | Ht 66.0 in

## 2017-09-12 DIAGNOSIS — M810 Age-related osteoporosis without current pathological fracture: Secondary | ICD-10-CM

## 2017-09-12 DIAGNOSIS — C50412 Malignant neoplasm of upper-outer quadrant of left female breast: Secondary | ICD-10-CM | POA: Diagnosis not present

## 2017-09-12 DIAGNOSIS — M818 Other osteoporosis without current pathological fracture: Secondary | ICD-10-CM

## 2017-09-12 DIAGNOSIS — Z171 Estrogen receptor negative status [ER-]: Secondary | ICD-10-CM

## 2017-09-12 LAB — CBC WITH DIFFERENTIAL/PLATELET
BASO%: 1 % (ref 0.0–2.0)
Basophils Absolute: 0.1 10*3/uL (ref 0.0–0.1)
EOS ABS: 0.2 10*3/uL (ref 0.0–0.5)
EOS%: 4.6 % (ref 0.0–7.0)
HEMATOCRIT: 37.6 % (ref 34.8–46.6)
HGB: 12 g/dL (ref 11.6–15.9)
LYMPH#: 1.2 10*3/uL (ref 0.9–3.3)
LYMPH%: 22.3 % (ref 14.0–49.7)
MCH: 29.3 pg (ref 25.1–34.0)
MCHC: 31.9 g/dL (ref 31.5–36.0)
MCV: 91.9 fL (ref 79.5–101.0)
MONO#: 0.5 10*3/uL (ref 0.1–0.9)
MONO%: 9 % (ref 0.0–14.0)
NEUT#: 3.3 10*3/uL (ref 1.5–6.5)
NEUT%: 63.1 % (ref 38.4–76.8)
PLATELETS: 209 10*3/uL (ref 145–400)
RBC: 4.09 10*6/uL (ref 3.70–5.45)
RDW: 14.9 % — ABNORMAL HIGH (ref 11.2–14.5)
WBC: 5.2 10*3/uL (ref 3.9–10.3)

## 2017-09-12 LAB — COMPREHENSIVE METABOLIC PANEL
ALT: 17 U/L (ref 0–55)
ANION GAP: 10 meq/L (ref 3–11)
AST: 21 U/L (ref 5–34)
Albumin: 4.4 g/dL (ref 3.5–5.0)
Alkaline Phosphatase: 67 U/L (ref 40–150)
BUN: 16.3 mg/dL (ref 7.0–26.0)
CALCIUM: 9.5 mg/dL (ref 8.4–10.4)
CHLORIDE: 106 meq/L (ref 98–109)
CO2: 24 meq/L (ref 22–29)
CREATININE: 0.8 mg/dL (ref 0.6–1.1)
EGFR: >60 mL/min/{1.73_m2} (ref >60)
Glucose: 85 mg/dl (ref 70–140)
POTASSIUM: 4.3 meq/L (ref 3.5–5.1)
Sodium: 140 mEq/L (ref 136–145)
Total Bilirubin: 0.26 mg/dL (ref 0.20–1.20)
Total Protein: 7.6 g/dL (ref 6.4–8.3)

## 2017-09-12 MED ORDER — SODIUM CHLORIDE 0.9 % IV SOLN
Freq: Once | INTRAVENOUS | Status: AC
  Administered 2017-09-12: 13:00:00 via INTRAVENOUS

## 2017-09-12 MED ORDER — ZOLEDRONIC ACID 4 MG/100ML IV SOLN
4.0000 mg | Freq: Once | INTRAVENOUS | Status: AC
  Administered 2017-09-12: 4 mg via INTRAVENOUS
  Filled 2017-09-12: qty 100

## 2017-09-12 NOTE — Patient Instructions (Signed)

## 2017-09-15 ENCOUNTER — Ambulatory Visit: Payer: BLUE CROSS/BLUE SHIELD | Admitting: Gastroenterology

## 2017-09-15 NOTE — Telephone Encounter (Signed)
Left message for pt to call back  °

## 2017-09-17 NOTE — Telephone Encounter (Signed)
Recall is entered. I have sent a message with this information via patient portal.

## 2017-09-26 ENCOUNTER — Other Ambulatory Visit: Payer: BLUE CROSS/BLUE SHIELD

## 2017-09-26 ENCOUNTER — Other Ambulatory Visit: Payer: Self-pay

## 2017-09-26 ENCOUNTER — Telehealth: Payer: Self-pay | Admitting: Nurse Practitioner

## 2017-09-26 DIAGNOSIS — R197 Diarrhea, unspecified: Secondary | ICD-10-CM

## 2017-09-26 NOTE — Telephone Encounter (Signed)
Patient husband calling in regarding this. Best # [phone]. He is requesting a callback soon.

## 2017-09-26 NOTE — Telephone Encounter (Signed)
Yes agree with checking C.diff. If negative start taking Probiotics Floristor 1 capsule daily for 2 weeks

## 2017-09-26 NOTE — Telephone Encounter (Signed)
Finished atb's 3 weeks ago. Patient is having cramping and soft to watery stool. She does not feel well. History of C Diff and hospitalization. Please advise.

## 2017-09-26 NOTE — Telephone Encounter (Signed)
Dr Silverio Decamp, would you please review as doctor of the day? Thank you.

## 2017-09-26 NOTE — Addendum Note (Signed)
Addended by: Kathlyn [place] on: 09/26/2017 03:59 PM   Modules accepted: Orders

## 2017-09-26 NOTE — Telephone Encounter (Signed)
Spouse advised

## 2017-10-02 ENCOUNTER — Other Ambulatory Visit: Payer: Self-pay

## 2017-10-02 ENCOUNTER — Telehealth: Payer: Self-pay | Admitting: Gastroenterology

## 2017-10-02 ENCOUNTER — Other Ambulatory Visit: Payer: BLUE CROSS/BLUE SHIELD

## 2017-10-02 ENCOUNTER — Telehealth: Payer: Self-pay | Admitting: Nurse Practitioner

## 2017-10-02 DIAGNOSIS — R197 Diarrhea, unspecified: Secondary | ICD-10-CM

## 2017-10-02 MED ORDER — DICYCLOMINE HCL 10 MG PO CAPS: 10.0000 mg | ORAL_CAPSULE | Freq: Three times a day (TID) | ORAL | 0 refills | Status: DC | PRN

## 2017-10-02 MED ORDER — VANCOMYCIN 50 MG/ML ORAL SOLUTION: 125.0000 mg | Freq: Four times a day (QID) | ORAL | 0 refills | Status: DC

## 2017-10-02 NOTE — Telephone Encounter (Signed)
Had spoken with the spouse last week about this. The patient is progressively feeling worse. The stool specimen will be brought in today. The husband reports she has come back home and gone to bed because she feels tired, has cramps and is nauseated. He feels she has a recurrance of C Diff. He is concerned she is becoming dehydrated. Asks for a prescription for Vancomycin today. Please advise.

## 2017-10-02 NOTE — Telephone Encounter (Signed)
CBC, CMP, ESR, CRP, TSH  Push fluids No milk products, high fat foods, raw fruits, raw vegetables Call if not improving Dicyclomine 10 mg po tid prn abd pain, abd cramping, #60, no refills Vancomycin 125 mg po qid, 14 days

## 2017-10-02 NOTE — Telephone Encounter (Signed)
Not eligible for home health nursing. Spoke with the patient. Advised her the doctor had wanted labs on her, but if she cannot come today, maybe she can come in tomorrow. Stool sample today before starting atb. Be mindful about staying hydrated.

## 2017-10-02 NOTE — Telephone Encounter (Signed)
Patient husband states he needs a call back asap due to his wife being in gastro distress and being dehydrated.

## 2017-10-03 ENCOUNTER — Other Ambulatory Visit (INDEPENDENT_AMBULATORY_CARE_PROVIDER_SITE_OTHER): Payer: BLUE CROSS/BLUE SHIELD

## 2017-10-03 DIAGNOSIS — R197 Diarrhea, unspecified: Secondary | ICD-10-CM | POA: Diagnosis not present

## 2017-10-03 LAB — COMPREHENSIVE METABOLIC PANEL
ALT: 9 U/L (ref 0–35)
AST: 12 U/L (ref 0–37)
Albumin: 4 g/dL (ref 3.5–5.2)
Alkaline Phosphatase: 59 U/L (ref 39–117)
BUN: 7 mg/dL (ref 6–23)
CALCIUM: 8.3 mg/dL — AB (ref 8.4–10.5)
CHLORIDE: 101 meq/L (ref 96–112)
CO2: 29 meq/L (ref 19–32)
Creatinine, Ser: 0.85 mg/dL (ref 0.40–1.20)
GFR: 71.71 mL/min (ref >60.00)
Glucose, Bld: 94 mg/dL (ref 70–99)
POTASSIUM: 3.1 meq/L — AB (ref 3.5–5.1)
Sodium: 138 mEq/L (ref 135–145)
Total Bilirubin: 0.4 mg/dL (ref 0.2–1.2)
Total Protein: 6.9 g/dL (ref 6.0–8.3)

## 2017-10-03 LAB — C. DIFFICILE GDH AND TOXIN A/B
GDH ANTIGEN: DETECTED
MICRO NUMBER:: 81434065
SPECIMEN QUALITY:: ADEQUATE
TOXIN A AND B: DETECTED

## 2017-10-03 LAB — CBC WITH DIFFERENTIAL/PLATELET
BASOS ABS: 0 10*3/uL (ref 0.0–0.1)
BASOS PCT: 0.5 % (ref 0.0–3.0)
EOS PCT: 4.4 % (ref 0.0–5.0)
Eosinophils Absolute: 0.3 10*3/uL (ref 0.0–0.7)
HEMATOCRIT: 37.5 % (ref 36.0–46.0)
Hemoglobin: 12.1 g/dL (ref 12.0–15.0)
LYMPHS PCT: 8.3 % — AB (ref 12.0–46.0)
Lymphs Abs: 0.7 10*3/uL (ref 0.7–4.0)
MCHC: 32.1 g/dL (ref 30.0–36.0)
MCV: 88.7 fl (ref 78.0–100.0)
MONOS PCT: 5.4 % (ref 3.0–12.0)
Monocytes Absolute: 0.4 10*3/uL (ref 0.1–1.0)
NEUTROS ABS: 6.4 10*3/uL (ref 1.4–7.7)
Neutrophils Relative %: 81.4 % — ABNORMAL HIGH (ref 43.0–77.0)
PLATELETS: 310 10*3/uL (ref 150.0–400.0)
RBC: 4.23 Mil/uL (ref 3.87–5.11)
RDW: 15 % (ref 11.5–15.5)
WBC: 7.9 10*3/uL (ref 4.0–10.5)

## 2017-10-03 LAB — TSH: TSH: 2.37 u[IU]/mL (ref 0.35–4.50)

## 2017-10-03 LAB — SEDIMENTATION RATE: Sed Rate: 33 mm/hr — ABNORMAL HIGH (ref 0–30)

## 2017-10-09 NOTE — Telephone Encounter (Signed)
Great, thanks sorry I missed that.

## 2017-10-09 NOTE — Telephone Encounter (Signed)
Looks like Dr. Fuller Plan and Barbera Setters have addressed - see chart she is on Tx w/ vancomycin Let me know if there is something else

## 2017-10-09 NOTE — Telephone Encounter (Signed)
Dr Carlean Purl this is a Fuller Plan pt that has tested positive for C Diff.  Please advise (the result is on Dr Lynne Leader desk

## 2017-10-13 ENCOUNTER — Telehealth: Payer: Self-pay | Admitting: Gastroenterology

## 2017-10-13 MED ORDER — VANCOMYCIN 50 MG/ML ORAL SOLUTION: ORAL | 0 refills | Status: DC

## 2017-10-13 NOTE — Telephone Encounter (Signed)
Informed patient that I sent in remainder of pulse taper to [place]. Patient verbalized understanding.

## 2017-11-18 ENCOUNTER — Ambulatory Visit (INDEPENDENT_AMBULATORY_CARE_PROVIDER_SITE_OTHER): Payer: BLUE CROSS/BLUE SHIELD | Admitting: Gastroenterology

## 2017-11-18 ENCOUNTER — Encounter: Payer: Self-pay | Admitting: Gastroenterology

## 2017-11-18 VITALS — BP 112/62 | HR 84 | Ht 67.0 in | Wt 147.0 lb

## 2017-11-18 DIAGNOSIS — A0472 Enterocolitis due to Clostridium difficile, not specified as recurrent: Secondary | ICD-10-CM

## 2017-11-18 DIAGNOSIS — K264 Chronic or unspecified duodenal ulcer with hemorrhage: Secondary | ICD-10-CM

## 2017-11-18 NOTE — Patient Instructions (Signed)
Continue omeprazole daily and Florastor twice daily x 2 months.  Follow up with Dr. Fuller Plan as needed.   Thank you for choosing me and [facility].  Pricilla Riffle. Dagoberto Ligas., MD., Mar[facility] Regal

## 2017-11-18 NOTE — Progress Notes (Signed)
    History of Present Illness: This is a 62 year old female treated for C. Difficile in October.  She was initially treated with a 2-week course of vancomycin.  Unfortunately she had a relapse in December and was treated with another 14-day course of vancomycin which was completed in early January. She continues on [employer].  Her diarrhea has completely resolved she has no gastrointestinal complaints.  She states she is taking omeprazole as needed for dyspepsia about once a week.  Current Medications, Allergies, Past Medical History, Past Surgical History, Family History and Social History were reviewed in [employer] record.  Physical Exam: General: Well developed, well nourished, no acute distress Head: Normocephalic and atraumatic Eyes:  sclerae anicteric, EOMI Ears: Normal auditory acuity Mouth: No deformity or lesions Lungs: Clear throughout to auscultation Heart: Regular rate and rhythm; no murmurs, rubs or bruits Abdomen: Soft, non tender and non distended. No masses, hepatosplenomegaly or hernias noted. Normal Bowel sounds Rectal: not done Musculoskeletal: Symmetrical with no gross deformities  Pulses:  Normal pulses noted Extremities: No clubbing, cyanosis, edema or deformities noted Neurological: Alert oriented x 4, grossly nonfocal Psychological:  Alert and cooperative. Normal mood and affect  Assessment and Recommendations:  1.  C. difficile diarrhea with initial episode in October and relapse in December.  She completed a 14-day course of vancomycin in early January.  Complete a 2 month course of Florastor.   2. Duodenal ulcer, with history of bleed.  Maintain omeprazole 20 mg daily long-term to help prevent a recurrent ulcer.   I spent 15 minutes of face-to-face time with the patient. Greater than 50% of the time was spent counseling and coordinating care.

## 2017-12-04 ENCOUNTER — Other Ambulatory Visit (INDEPENDENT_AMBULATORY_CARE_PROVIDER_SITE_OTHER): Payer: BLUE CROSS/BLUE SHIELD

## 2017-12-04 DIAGNOSIS — Z23 Encounter for immunization: Secondary | ICD-10-CM

## 2017-12-30 ENCOUNTER — Encounter: Payer: Self-pay | Admitting: Oncology

## 2018-01-07 ENCOUNTER — Other Ambulatory Visit: Payer: Self-pay | Admitting: Family Medicine

## 2018-01-07 MED ORDER — PROMETHAZINE HCL 25 MG PO TABS: 25.0000 mg | ORAL_TABLET | Freq: Four times a day (QID) | ORAL | 0 refills | Status: DC | PRN

## 2018-01-07 NOTE — Telephone Encounter (Signed)
Yes, this is her preference.

## 2018-01-07 NOTE — Telephone Encounter (Signed)
Is this okay to refill? 

## 2018-01-07 NOTE — Telephone Encounter (Signed)
I usually recommend meclizine or patches for motion sickness.  If she has used this in the past and prefers this for nausea prn, then okay to refill.  Remind her that it can be sedating

## 2018-02-10 ENCOUNTER — Inpatient Hospital Stay: Payer: BLUE CROSS/BLUE SHIELD | Attending: Oncology | Admitting: Adult Health

## 2018-02-10 ENCOUNTER — Encounter: Payer: Self-pay | Admitting: Adult Health

## 2018-02-10 ENCOUNTER — Telehealth: Payer: Self-pay | Admitting: Oncology

## 2018-02-10 ENCOUNTER — Inpatient Hospital Stay: Payer: BLUE CROSS/BLUE SHIELD

## 2018-02-10 VITALS — BP 122/67 | HR 82 | Temp 98.8°F | Resp 18 | Ht 67.0 in | Wt 149.0 lb

## 2018-02-10 DIAGNOSIS — Z171 Estrogen receptor negative status [ER-]: Secondary | ICD-10-CM

## 2018-02-10 DIAGNOSIS — Z79899 Other long term (current) drug therapy: Secondary | ICD-10-CM | POA: Insufficient documentation

## 2018-02-10 DIAGNOSIS — Z923 Personal history of irradiation: Secondary | ICD-10-CM | POA: Insufficient documentation

## 2018-02-10 DIAGNOSIS — C50412 Malignant neoplasm of upper-outer quadrant of left female breast: Secondary | ICD-10-CM

## 2018-02-10 DIAGNOSIS — Z9221 Personal history of antineoplastic chemotherapy: Secondary | ICD-10-CM | POA: Diagnosis not present

## 2018-02-10 DIAGNOSIS — E2839 Other primary ovarian failure: Secondary | ICD-10-CM

## 2018-02-10 DIAGNOSIS — M810 Age-related osteoporosis without current pathological fracture: Secondary | ICD-10-CM

## 2018-02-10 DIAGNOSIS — M818 Other osteoporosis without current pathological fracture: Secondary | ICD-10-CM

## 2018-02-10 LAB — COMPREHENSIVE METABOLIC PANEL
ALBUMIN: 4.3 g/dL (ref 3.5–5.0)
ALT: 14 U/L (ref 0–55)
ANION GAP: 6 (ref 3–11)
AST: 23 U/L (ref 5–34)
Alkaline Phosphatase: 68 U/L (ref 40–150)
BILIRUBIN TOTAL: 0.3 mg/dL (ref 0.2–1.2)
BUN: 14 mg/dL (ref 7–26)
CO2: 30 mmol/L — ABNORMAL HIGH (ref 22–29)
Calcium: 9.5 mg/dL (ref 8.4–10.4)
Chloride: 105 mmol/L (ref 98–109)
Creatinine, Ser: 0.81 mg/dL (ref 0.60–1.10)
GFR calc Af Amer: >60 mL/min (ref >60)
Glucose, Bld: 90 mg/dL (ref 70–140)
POTASSIUM: 4.5 mmol/L (ref 3.5–5.1)
Sodium: 141 mmol/L (ref 136–145)
TOTAL PROTEIN: 6.8 g/dL (ref 6.4–8.3)

## 2018-02-10 LAB — CBC WITH DIFFERENTIAL/PLATELET
BASOS ABS: 0.1 10*3/uL (ref 0.0–0.1)
Basophils Relative: 1 %
Eosinophils Absolute: 0.1 10*3/uL (ref 0.0–0.5)
Eosinophils Relative: 4 %
HEMATOCRIT: 30.8 % — AB (ref 34.8–46.6)
Hemoglobin: 10.2 g/dL — ABNORMAL LOW (ref 11.6–15.9)
LYMPHS PCT: 21 %
Lymphs Abs: 0.8 10*3/uL — ABNORMAL LOW (ref 0.9–3.3)
MCH: 28.4 pg (ref 25.1–34.0)
MCHC: 33.1 g/dL (ref 31.5–36.0)
MCV: 86 fL (ref 79.5–101.0)
Monocytes Absolute: 0.4 10*3/uL (ref 0.1–0.9)
Monocytes Relative: 11 %
NEUTROS ABS: 2.4 10*3/uL (ref 1.5–6.5)
NEUTROS PCT: 63 %
PLATELETS: 205 10*3/uL (ref 145–400)
RBC: 3.59 MIL/uL — AB (ref 3.70–5.45)
RDW: 17 % — ABNORMAL HIGH (ref 11.2–14.5)
WBC: 3.8 10*3/uL — AB (ref 3.9–10.3)

## 2018-02-10 MED ORDER — ZOLEDRONIC ACID 4 MG/100ML IV SOLN
4.0000 mg | Freq: Once | INTRAVENOUS | Status: AC
  Administered 2018-02-10: 4 mg via INTRAVENOUS
  Filled 2018-02-10: qty 100

## 2018-02-10 NOTE — Progress Notes (Signed)
[facility]  [phone] [phone]     ID: Rhonda Gardner DOB: 05/26/1956  MR#: [id number]  CSN#:[id number]  Patient Care Team: Knapp, Eve, MD as PCP - General (Family Medicine) Magrinat, Gustav C, MD as Consulting Physician (Oncology) Wentworth, Stacy, MD (Inactive) as Consulting Physician (Radiation Oncology) Stuart, Dawn C, RN as Registered Nurse Martini, Keisha N, RN as Registered Nurse Dawson, Gretchen W, NP as Nurse Practitioner (Nurse Practitioner) Toth, Paul III, MD as Consulting Physician (General Surgery) PCP: Knapp, Eve, MD OTHER MD: Mark Shapiro, MD  CHIEF COMPLAINT: HER-2 positive breast cancer  CURRENT TREATMENT:  Zolendronate   BREAST CANCER HISTORY: From the original intake note:  "Rhonda Gardner" had not had a physical exam in quite some time, and had not had a mammogram since 2020, when she was evaluated by Dr. Knapp 12/01/2014. Dr. Knapp was able to palpate a 2 cm firm mass at the 3:00 position in the left breast. The patient herself has been aware of multiple breast masses and does have a history of fibrocystic change, with multiple prior benign biopsies. She did not feel this particular mass had changed recently.   Dr. Knapp arranged for bilateral diagnostic mammography and left breast ultrasonography at the breast Center 12/15/2014. This shows the breast density to be category C. In the left breast upper outer quadrant there was a bilobed mass measuring approximately 2 cm. This was palpable. Ultrasound confirmed an irregular microlobulated and hypoechoic mass measuring 2.1 cm at the 2:00 location 6 cm from the nipple. There was no left axillary adenopathy.  Biopsy of the mass in question 12/19/2014 showed ([id number]) invasive ductal carcinoma, grade 2 or 3, estrogen and progesterone receptor negative, with an MIB-1 of 38%, and equivocal HER-2 amplification, the signals ratio being 1.57, the number per cell 4.25.  On 12/27/2014  the patient underwent bilateral breast MRI. The mass in question at the 2:00 location of the left breast measured 2.5 cm. It directly abuts the left pectoralis major. There is no enhancement in the muscle itself. There were no abnormal appearing lymph nodes. There was an incidentally noted 1 cm hepatic cyst at the dome of the right liver lobe.  Her subsequent history is as detailed below  INTERVAL HISTORY: Rhonda Gardner returns today for follow-up of her estrogen receptor negative breast cancer. Interval history is generally unremarkable as far as breast cancer is concerned. She has noted no change in either breast. She continues on Zometa with good tolerance.  She has no dental concerns.  She receives Zometa every three months.     REVIEW OF SYSTEMS: Rhonda Gardner is doing well today.  She is seeing her PCP regularly.  She is not exercising as much as she should.  She is up to date with all of her health maintenance and cancer screening.  A detailed ROS was conducted and is otherwise non contributory.    PAST MEDICAL HISTORY: Past Medical History:  Diagnosis Date  . Allergy 2006   chemical cauterization in spring 2006, and 2nd treatment with good results.(Dr.Shoemaker)  . Bipolar disorder (HCC)   . Bleeding ulcer   . Breast cancer (HCC) 12/2014   invasive ductal carcinoma (LEFT)  . Breast cancer of upper-outer quadrant of left female breast (HCC) 12/22/2014  . Breast hematoma 12/19/2015   had left breast aspiration of 4cm hematoma-path indicates benign hematoma  . C. difficile colitis   . C. difficile diarrhea   . Osteoporosis 06/20/2016   T-2.5@spine on Dexa ([facility])  . PONV (  postoperative nausea and vomiting)   . Symptomatic anemia 07/16/2017    PAST SURGICAL HISTORY: Past Surgical History:  Procedure Laterality Date  . BREAST BIOPSY Bilateral    benign and a right stereotatic breast biopsy.  . ESOPHAGOGASTRODUODENOSCOPY N/A 07/16/2017   Procedure: ESOPHAGOGASTRODUODENOSCOPY (EGD);  Surgeon:  Stark, Malcolm T, MD;  Location: [facility];  Service: Endoscopy;  Laterality: N/A;  . fibroid excision  age 30   prolapsed fibroid removed  . FOOT SURGERY Left 2014   Dr. Regal  . FOOT SURGERY Right 08/2016   Dr. Regal --Shortening Osteotomy with Fixation 1st Metatarsal   . MOUTH SURGERY  06/2017   [facility]   . PORT-A-CATH REMOVAL N/A 02/15/2016   Procedure: REMOVAL PORT-A-CATH;  Surgeon: Paul Toth III, MD;  Location: [facility];  Service: General;  Laterality: N/A;  . RADIOACTIVE SEED GUIDED PARTIAL MASTECTOMY WITH AXILLARY SENTINEL LYMPH NODE BIOPSY Left 05/25/2015   Procedure: LEFT BREAST LUMPECTOMY WITH RADIOACTIVE SEED AND SENTINEL LYMPH NODE BIOPSY;  Surgeon: Paul Toth III, MD;  Location: [facility];  Service: General;  Laterality: Left;    FAMILY HISTORY Family History  Problem Relation Age of Onset  . Heart disease Father   . Skin cancer Father        non melanoma - farmer  . Hypertension Mother   . Arthritis Mother        osteoarthritis  . Macular degeneration Mother        wet and dry  . Colon cancer Mother 81       colon cancer at age 81  . Osteoporosis Sister        osteopenia (states it resolved)  . Lung cancer Maternal Aunt        lung cancer  . Cancer Paternal Aunt 25       ? stomach cancer  . Autism Sister   . Mental retardation Sister   . Blindness Sister        secondary to retrolental fibroplasia  . Osteoporosis Sister   . Goiter Paternal Aunt   . Diabetes Paternal Aunt    the patient's father lived to be 95. He had a history of nonmelanoma skin cancers. [name] mother is living at age 98. She was diagnosed with colon cancer in her 80s. A paternal aunt may have had stomach cancer. Maternal aunt developed lung cancer in her 70s. The patient has one brother, 2 sisters. One of her sisters is blind and mentally retarded. There is no history of breast or ovarian cancer in the family.  GYNECOLOGIC HISTORY:  No LMP recorded.  Patient is postmenopausal. Menarche age 10. The patient is GX P0. She stopped having periods in her early 50s. She did not take hormone replacement. She took oral contraceptives remotely for over 5 years with no complications.  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 Her husband Ted worked in IT originally but now runs a family firm. It's just the 2 of them at home, plus a chocolate lab.    ADVANCED DIRECTIVES: In place   HEALTH MAINTENANCE: Social History   Tobacco Use  . Smoking status: Never Smoker  . Smokeless tobacco: Never Used  Substance Use Topics  . Alcohol use: Yes    Alcohol/week: 0.0 oz    Comment: 1-2 glasses of wine a month  . Drug use: No     Colonoscopy: In [place] under Dr. Gibbs, date not entirely clear  PAP: February 2016  Bone density:   Under [facility], results not currently available  Lipid panel:  Allergies  Allergen Reactions  . Clindamycin/Lincomycin Diarrhea    c diff infection  . Adhesive [Tape] Rash    Pt is sensitive to any kind of adhesive tape products.    Current Outpatient Medications  Medication Sig Dispense Refill  . Cholecalciferol (VITAMIN D) 2000 UNITS tablet Take 2,000 Units by mouth daily.    . fluticasone (FLONASE) 50 MCG/ACT nasal spray Place 1 spray into both nostrils daily.     . LAMICTAL 200 MG tablet Take 0.5 tablets (100 mg total) by mouth daily. 56 tablet 4  . LUTEIN-ZEAXANTHIN PO Take 1 tablet by mouth daily.    . magnesium 30 MG tablet Take 30 mg by mouth daily.    . omeprazole (PRILOSEC) 20 MG capsule Take 20 mg by mouth daily.    . phenylephrine (SUDAFED PE) 10 MG TABS tablet Take 10-30 mg by mouth every 4 (four) hours as needed (congestion).    . promethazine (PHENERGAN) 25 MG tablet Take 1 tablet (25 mg total) by mouth every 6 (six) hours as needed for nausea or vomiting. 30 tablet 0  . Zoledronic Acid (ZOMETA IV) Inject every 3 (three) months into the vein.      No current  facility-administered medications for this visit.     OBJECTIVE:  Vitals:   02/10/18 1249  BP: 122/67  Pulse: 82  Resp: 18  Temp: 98.8 F (37.1 C)  SpO2: 100%     Body mass index is 23.34 kg/m.    ECOG FS:1 - Symptomatic but completely ambulatory  GENERAL: Patient is a well appearing female in no acute distress HEENT:  Sclerae anicteric.  Oropharynx clear and moist. No ulcerations or evidence of oropharyngeal candidiasis. Neck is supple.  NODES:  No cervical, supraclavicular, or axillary lymphadenopathy palpated.  BREAST EXAM: left breast s/p lumpectomy, no nodules, masses, skin changes, right breast without nodules, masses, skin or nipple changes  LUNGS:  Clear to auscultation bilaterally.  No wheezes or rhonchi. HEART:  Regular rate and rhythm. No murmur appreciated. ABDOMEN:  Soft, nontender.  Positive, normoactive bowel sounds. No organomegaly palpated. MSK:  No focal spinal tenderness to palpation. Full range of motion bilaterally in the upper extremities. EXTREMITIES:  No peripheral edema.   SKIN:  Clear with no obvious rashes or skin changes. No nail dyscrasia. NEURO:  Nonfocal. Well oriented.  Appropriate affect.    LAB RESULTS:  CMP     Component Value Date/Time   NA 138 10/03/2017 1238   NA 140 09/12/2017 1045   K 3.1 (L) 10/03/2017 1238   K 4.3 09/12/2017 1045   CL 101 10/03/2017 1238   CO2 29 10/03/2017 1238   CO2 24 09/12/2017 1045   GLUCOSE 94 10/03/2017 1238   GLUCOSE 85 09/12/2017 1045   BUN 7 10/03/2017 1238   BUN 16.3 09/12/2017 1045   CREATININE 0.85 10/03/2017 1238   CREATININE 0.8 09/12/2017 1045   CALCIUM 8.3 (L) 10/03/2017 1238   CALCIUM 9.5 09/12/2017 1045   PROT 6.9 10/03/2017 1238   PROT 7.6 09/12/2017 1045   ALBUMIN 4.0 10/03/2017 1238   ALBUMIN 4.4 09/12/2017 1045   AST 12 10/03/2017 1238   AST 21 09/12/2017 1045   ALT 9 10/03/2017 1238   ALT 17 09/12/2017 1045   ALKPHOS 59 10/03/2017 1238   ALKPHOS 67 09/12/2017 1045   BILITOT 0.4  10/03/2017 1238   BILITOT 0.26 09/12/2017 1045   GFRNONAA >60 08/20/2017 0333     GFRAA >60 08/20/2017 0333    INo results found for: SPEP, UPEP  Lab Results  Component Value Date   WBC 3.8 (L) 02/10/2018   NEUTROABS 2.4 02/10/2018   HGB 10.2 (L) 02/10/2018   HCT 30.8 (L) 02/10/2018   MCV 86.0 02/10/2018   PLT 205 02/10/2018      Chemistry      Component Value Date/Time   NA 138 10/03/2017 1238   NA 140 09/12/2017 1045   K 3.1 (L) 10/03/2017 1238   K 4.3 09/12/2017 1045   CL 101 10/03/2017 1238   CO2 29 10/03/2017 1238   CO2 24 09/12/2017 1045   BUN 7 10/03/2017 1238   BUN 16.3 09/12/2017 1045   CREATININE 0.85 10/03/2017 1238   CREATININE 0.8 09/12/2017 1045      Component Value Date/Time   CALCIUM 8.3 (L) 10/03/2017 1238   CALCIUM 9.5 09/12/2017 1045   ALKPHOS 59 10/03/2017 1238   ALKPHOS 67 09/12/2017 1045   AST 12 10/03/2017 1238   AST 21 09/12/2017 1045   ALT 9 10/03/2017 1238   ALT 17 09/12/2017 1045   BILITOT 0.4 10/03/2017 1238   BILITOT 0.26 09/12/2017 1045       No results found for: LABCA2  No components found for: LABCA125  No results for input(s): INR in the last 168 hours.  Urinalysis    Component Value Date/Time   LABSPEC 1.005 04/03/2015 1451   PHURINE 7.5 04/03/2015 1451   PHURINE 7.5 08/09/2007 2121   GLUCOSEU Negative 04/03/2015 1451   HGBUR Trace 04/03/2015 1451   HGBUR SMALL (A) 08/09/2007 2121   BILIRUBINUR neg 04/10/2017 0858   BILIRUBINUR Negative 04/03/2015 1451   KETONESUR Negative 04/03/2015 1451   [place] 08/09/2007 2121   PROTEINUR neg 04/10/2017 0858   PROTEINUR Negative 04/03/2015 1451   PROTEINUR NEGATIVE 08/09/2007 2121   UROBILINOGEN negative (A) 04/10/2017 0858   UROBILINOGEN 0.2 04/03/2015 1451   NITRITE neg 04/10/2017 0858   NITRITE Negative 04/03/2015 1451   NITRITE NEGATIVE 08/09/2007 2121   LEUKOCYTESUR Negative 04/10/2017 0858   LEUKOCYTESUR Small 04/03/2015 1451    STUDIES: No results  found.  ASSESSMENT: 62 y.o. 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  Left breast boost/ 16 Gy at 2 Gy per fraction x 8 fractions  (4) genetics counselor felt the risk of a familial mutation was sufficiently low testing could be omitted  (5) continuing trastuzumab every 3 weeks to complete a year, last dose 01/08/2016  (a) most recent echocardiogram 10/11/2015 shows an ejection fraction in the 65%-70% range  (6) left eye visual disturbance with negative brain MRI with and without contrast 10/12/2015  (7) bone density [facility] 06/20/2016 osteoporosis with a T score of -2.5.   (a) zolendronate every 6 months for 2 years started November 2017  PLAN:  Maecyn is doing well today.  She has no sign of recurrence.  She is receiving Zometa for her osteoporosis and is tolerating it well.  She will receive this today.  She does need a repeat bone density, and I ordered this today.  She will return in 6 months for labs and Zometa, and in one year for labs, f/u with Dr. Jana Hakim, and Zometa.    I gave her information in her AVS about bone health.  She was recommended to continue with exercise, healthy diet, abstinence from tobacco, and  limiting alcohol intake.    Taran knows to call for any questions or concerns prior to her next appointment with [place].    A total of (20) minutes of face-to-face time was spent with this patient with greater than 50% of that time in counseling and care-coordination.   Scot Dock, NP   02/10/2018 1:09 PM

## 2018-02-10 NOTE — Patient Instructions (Signed)

## 2018-02-10 NOTE — Patient Instructions (Signed)
Bone Health Bones protect organs, store calcium, and anchor muscles. Good health habits, such as eating nutritious foods and exercising regularly, are important for maintaining healthy bones. They can also help to prevent a condition that causes bones to lose density and become weak and brittle (osteoporosis). Why is bone mass important? Bone mass refers to the amount of bone tissue that you have. The higher your bone mass, the stronger your bones. An important step toward having healthy bones throughout life is to have strong and dense bones during childhood. A young adult who has a high bone mass is more likely to have a high bone mass later in life. Bone mass at its greatest it is called peak bone mass. A large decline in bone mass occurs in older adults. In women, it occurs about the time of menopause. During this time, it is important to practice good health habits, because if more bone is lost than what is replaced, the bones will become less healthy and more likely to break (fracture). If you find that you have a low bone mass, you may be able to prevent osteoporosis or further bone loss by changing your diet and lifestyle. How can I find out if my bone mass is low? Bone mass can be measured with an X-ray test that is called a bone mineral density (BMD) test. This test is recommended for all women who are age 65 or older. It may also be recommended for men who are age 70 or older, or for people who are more likely to develop osteoporosis due to:  Having bones that break easily.  Having a long-term disease that weakens bones, such as kidney disease or rheumatoid arthritis.  Having menopause earlier than normal.  Taking medicine that weakens bones, such as steroids, thyroid hormones, or hormone treatment for breast cancer or prostate cancer.  Smoking.  Drinking three or more alcoholic drinks each day.  What are the nutritional recommendations for healthy bones? To have healthy bones, you  need to get enough of the right minerals and vitamins. Most nutrition experts recommend getting these nutrients from the foods that you eat. Nutritional recommendations vary from person to person. Ask your health care provider what is healthy for you. Here are some general guidelines. Calcium Recommendations Calcium is the most important (essential) mineral for bone health. Most people can get enough calcium from their diet, but supplements may be recommended for people who are at risk for osteoporosis. Good sources of calcium include:  Dairy products, such as low-fat or nonfat milk, cheese, and yogurt.  Dark green leafy vegetables, such as bok choy and broccoli.  Calcium-fortified foods, such as orange juice, cereal, bread, soy beverages, and tofu products.  Nuts, such as almonds.  Follow these recommended amounts for daily calcium intake:  Children, age 1?3: 700 mg.  Children, age 4?8: 1,000 mg.  Children, age 9?13: 1,300 mg.  Teens, age 14?18: 1,300 mg.  Adults, age 19?50: 1,000 mg.  Adults, age 51?70: ? Men: 1,000 mg. ? Women: 1,200 mg.  Adults, age 71 or older: 1,200 mg.  Pregnant and breastfeeding females: ? Teens: 1,300 mg. ? Adults: 1,000 mg.  Vitamin D Recommendations Vitamin D is the most essential vitamin for bone health. It helps the body to absorb calcium. Sunlight stimulates the skin to make vitamin D, so be sure to get enough sunlight. If you live in a cold climate or you do not get outside often, your health care provider may recommend that you take vitamin   D supplements. Good sources of vitamin D in your diet include:  Egg yolks.  Saltwater fish.  Milk and cereal fortified with vitamin D.  Follow these recommended amounts for daily vitamin D intake:  Children and teens, age 1?18: 600 international units.  Adults, age 50 or younger: 400-800 international units.  Adults, age 51 or older: 800-1,000 international units.  Other Nutrients Other nutrients  for bone health include:  Phosphorus. This mineral is found in meat, poultry, dairy foods, nuts, and legumes. The recommended daily intake for adult men and adult women is 700 mg.  Magnesium. This mineral is found in seeds, nuts, dark green vegetables, and legumes. The recommended daily intake for adult men is 400?420 mg. For adult women, it is 310?320 mg.  Vitamin K. This vitamin is found in green leafy vegetables. The recommended daily intake is 120 mg for adult men and 90 mg for adult women.  What type of physical activity is best for building and maintaining healthy bones? Weight-bearing and strength-building activities are important for building and maintaining peak bone mass. Weight-bearing activities cause muscles and bones to work against gravity. Strength-building activities increases muscle strength that supports bones. Weight-bearing and muscle-building activities include:  Walking and hiking.  Jogging and running.  Dancing.  Gym exercises.  Lifting weights.  Tennis and racquetball.  Climbing stairs.  Aerobics.  Adults should get at least 30 minutes of moderate physical activity on most days. Children should get at least 60 minutes of moderate physical activity on most days. Ask your health care provide what type of exercise is best for you. Where can I find more information? For more information, check out the following websites:  National Osteoporosis Foundation: http://nof.org/learn/basics  National Institutes of Health: http://www.niams.nih.gov/Health_Info/Bone/Bone_Health/bone_health_for_life.asp  This information is not intended to replace advice given to you by your health care provider. Make sure you discuss any questions you have with your health care provider. Document Released: 12/21/2003 Document Revised: 04/19/2016 Document Reviewed: 10/05/2014 Elsevier Interactive Patient Education  2018 [employer]  

## 2018-02-10 NOTE — Telephone Encounter (Signed)
Patient decline avs

## 2018-02-19 ENCOUNTER — Other Ambulatory Visit (INDEPENDENT_AMBULATORY_CARE_PROVIDER_SITE_OTHER): Payer: BLUE CROSS/BLUE SHIELD

## 2018-02-19 DIAGNOSIS — Z23 Encounter for immunization: Secondary | ICD-10-CM

## 2018-04-12 NOTE — Progress Notes (Signed)
Chief Complaint  Patient presents with  . fasting cpe    fasting cpe. last pap possibly 2017. eye exam done within a year    Rhonda Gardner is a 62 y.o. female who presents for a complete physical.  She is feeling well today, without concerns.  She had C. Difficile infection in October, treated with a 2-week course of vancomycin. She had a relapse in December, treated with another 14-day course of vancomycin which was completed in early January.  She had f/u with Dr. Fuller Plan in February, and was doing well at that time, no recurrent diarrhea.  He recommended she complete a 2 month course of Florastor, which she did. She was on amoxil for oral surgery recently, so restarted probiotic.  She also had bleeding duodenal ulcer (reason for October 2018 admission), so he recommends taking omeprazole 20 mg daily long-term to help prevent a recurrent ulcer.   Left breast cancer (invasive ductal carcinoma), HER-2 positive: She is s/p L breast lumpectomy with radioactive seed and sentinel node biopsy, neoadjuvant chemo (April-July 2016) and adjuvant radiation (September-October 2016) and Herceptin (for a total of a year).  She was last seen by oncologist in April. She has been getting zolendronate every 6 months (started 06/2016, with plans to give q 6 months for 2 years then yearly for another 3). This is both to treat osteoporosis and also decrease the risk of breast cancer recurrence by 2-3%per Dr. Jana Hakim. She is tolerating this without side effects, last given 02/10/18. She had labs done recently per onc, see below.  She cannot recall the timing of when she last donated blood, compared to the labs that were drawn (anemia noted on those labs, see below).  Bipolar disorder: Diagnosed age 60 or 45. She has been stable on her current regimen of lamictal. Moods are well controlled, no side effects. She previously was under the care of Dr. Letta Moynahan and Gala Murdoch (who retired).  We have  been managing these medications, and she continues to do well. She gets her medications from "[email or web address]", gets them from [place]. She reports it is a brand, but looks different than the branded medication in the [place]. Hasn't tolerated generic lamictal in the states (myalgias with generics here); denies any side effects or myalgias. Moods are good.   Immunization History  Administered Date(s) Administered  . DTaP 10/14/1954, 10/15/1955, 10/15/1959  . Gamma Globulin 08/23/1988  . Hepatitis A 11/02/1996, 09/29/2002  . Hepatitis B 11/11/2007  . IPV 08/15/1988  . Influenza Split 11/11/2007, 07/13/2013  . Influenza,inj,Quad PF,6+ Mos 05/30/2016, 07/17/2017  . Influenza-Unspecified 07/31/2015  . MMR 04/29/2003  . Measles 07/25/1988  . Meningococcal Polysaccharide 07/08/1988  . Pneumococcal Conjugate-13 04/08/2016  . Rubella 07/25/1988  . Td 10/15/1987, 11/02/1996, 01/23/1998  . Tdap 11/11/2007, 01/20/2013  . Typhoid Live 11/11/2007  . Typhoid Parenteral 07/25/1988, 08/23/1988  . Zoster 12/01/2014  . Zoster Recombinat (Shingrix) 12/04/2017, 02/19/2018   Last Pap smear: 11/2014--normal, no high risk HPV detected Last mammogram: 12/2017 Last colonoscopy:06/2016--normal, internal hemorrhoids Last DEXA: 06/2016 T-2.5 Dentist: every 9 months Ophtho: yearly Exercise: No regular exercise since her illness in the Fall.  Got her bike out of the attic and plans to start on the trainer. No longer using weights.  Vitamin D-OH 61 on 12/01/14 Lipids Lab Results  Component Value Date   CHOL 208 (H) 04/10/2017   HDL 102 04/10/2017   LDLCALC 88 04/10/2017   TRIG 89 04/10/2017   CHOLHDL 2.0 04/10/2017   Thyroid screen: Lab  Results  Component Value Date   TSH 2.37 10/03/2017    Past Medical History:  Diagnosis Date  . Allergy 2006   chemical cauterization in spring 2006, and 2nd treatment with good results.(Dr.Shoemaker)  . Bipolar disorder ([place])   . Bleeding ulcer   . Breast cancer ([place])  12/2014   invasive ductal carcinoma (LEFT)  . Breast cancer of upper-outer quadrant of left female breast ([place]) 12/22/2014  . Breast hematoma 12/19/2015   had left breast aspiration of 4cm hematoma-path indicates benign hematoma  . C. difficile colitis   . C. difficile diarrhea   . Osteoporosis 06/20/2016   T-2.5'@spine'  on Dexa ([facility])  . PONV (postoperative nausea and vomiting)   . Symptomatic anemia 07/16/2017    Past Surgical History:  Procedure Laterality Date  . BREAST BIOPSY Bilateral    benign and a right stereotatic breast biopsy.  . ESOPHAGOGASTRODUODENOSCOPY N/A 07/16/2017   Procedure: ESOPHAGOGASTRODUODENOSCOPY (EGD);  Surgeon: Ladene Artist, MD;  Location: [facility] ENDOSCOPY;  Service: Endoscopy;  Laterality: N/A;  . fibroid excision  age 3   prolapsed fibroid removed  . FOOT SURGERY Left 2014   Dr. Paulla Dolly  . FOOT SURGERY Right 08/2016   Dr. Paulla Dolly --Shortening Osteotomy with Fixation 1st Metatarsal   . MOUTH SURGERY  06/2017   CYST   . PORT-A-CATH REMOVAL N/A 02/15/2016   Procedure: REMOVAL PORT-A-CATH;  Surgeon: Autumn Messing III, MD;  Location: [facility];  Service: General;  Laterality: N/A;  . RADIOACTIVE SEED GUIDED PARTIAL MASTECTOMY WITH AXILLARY SENTINEL LYMPH NODE BIOPSY Left 05/25/2015   Procedure: LEFT BREAST LUMPECTOMY WITH RADIOACTIVE SEED AND SENTINEL LYMPH NODE BIOPSY;  Surgeon: Autumn Messing III, MD;  Location: [facility];  Service: General;  Laterality: Left;    Social History   Socioeconomic History  . Marital status: Married    Spouse name: Not on file  . Number of children: Not on file  . Years of education: Not on file  . Highest education level: Not on file  Occupational History  . Not on file  Social Needs  . Financial resource strain: Not on file  . Food insecurity:    Worry: Not on file    Inability: Not on file  . Transportation needs:    Medical: Not on file    Non-medical: Not on file  Tobacco Use  . Smoking  status: Never Smoker  . Smokeless tobacco: Never Used  Substance and Sexual Activity  . Alcohol use: Yes    Alcohol/week: 0.0 oz    Comment: 1-2 glasses of wine a month  . Drug use: No  . Sexual activity: Yes    Partners: Male    Comment: postmenopausal  Lifestyle  . Physical activity:    Days per week: Not on file    Minutes per session: Not on file  . Stress: Not on file  Relationships  . Social connections:    Talks on phone: Not on file    Gets together: Not on file    Attends religious service: Not on file    Active member of club or organization: Not on file    Attends meetings of clubs or organizations: Not on file    Relationship status: Not on file  . Intimate partner violence:    Fear of current or ex partner: Not on file    Emotionally abused: Not on file    Physically abused: Not on file    Forced sexual activity: Not on file  Other Topics Concern  . Not on file  Social History Narrative   Married [name]), 1 dog (chocolate lab). Homemaker    Family History  Problem Relation Age of Onset  . Heart disease Father   . Skin cancer Father        non melanoma - farmer  . Hypertension Mother   . Arthritis Mother        osteoarthritis  . Macular degeneration Mother        wet and dry  . Colon cancer Mother 34       colon cancer at age 12  . Osteoporosis Sister        osteopenia (states it resolved)  . Lung cancer Maternal Aunt        lung cancer  . Cancer Paternal Aunt 62       ? stomach cancer  . Autism Sister   . Mental retardation Sister   . Blindness Sister        secondary to retrolental fibroplasia  . Osteoporosis Sister   . Goiter Paternal Aunt   . Diabetes Paternal Aunt     Outpatient Encounter Medications as of 04/13/2018  Medication Sig  . Cholecalciferol (VITAMIN D) 2000 UNITS tablet Take 2,000 Units by mouth daily.  . fluticasone (FLONASE) 50 MCG/ACT nasal spray Place 1 spray into both nostrils daily.   Marland Kitchen LAMICTAL 200 MG tablet Take 0.5  tablets (100 mg total) by mouth daily.  . LUTEIN-ZEAXANTHIN PO Take 1 tablet by mouth daily.  . magnesium 30 MG tablet Take 30 mg by mouth daily.  Marland Kitchen omeprazole (PRILOSEC) 20 MG capsule Take 20 mg by mouth daily.  . promethazine (PHENERGAN) 25 MG tablet Take 1 tablet (25 mg total) by mouth every 6 (six) hours as needed for nausea or vomiting.  . Zoledronic Acid (ZOMETA IV) Inject into the vein every 6 (six) months.  . [DISCONTINUED] phenylephrine (SUDAFED PE) 10 MG TABS tablet Take 10-30 mg by mouth every 4 (four) hours as needed (congestion).   No facility-administered encounter medications on file as of 04/13/2018.     Allergies  Allergen Reactions  . Clindamycin/Lincomycin Diarrhea    c diff infection  . Adhesive [Tape] Rash    Pt is sensitive to any kind of adhesive tape products.    ROS: The patient denies anorexia, fever, weight changes, headaches, decreased hearing, ear pain, sore throat, breast concerns, chest pain, palpitations, dizziness, syncope, dyspnea on exertion, cough, swelling, nausea, vomiting, diarrhea, constipation, abdominal pain, melena, hematochezia, indigestion/heartburn, hematuria, incontinence, dysuria, vaginal bleeding, discharge, odor or itch, genital lesions, joint pains, numbness, tingling (neuropathy from chemo resolved--minimal difference in sensation in her feet, and balance sometimes feels a little off), weakness, tremor, suspicious skin lesions, depression, anxiety, abnormal bleeding/bruising, or enlarged lymph nodes. No further GI problems. Bruise noted on left arm--she doesn't recall trauma.  Denies any other significant bruising.   PHYSICAL EXAM:  BP 122/80   Pulse 64   Ht 5' 6.5" (1.689 m)   Wt 149 lb 3.2 oz (67.7 kg)   BMI 23.72 kg/m    Wt Readings from Last 3 Encounters:  04/13/18 149 lb 3.2 oz (67.7 kg)  02/10/18 149 lb (67.6 kg)  11/18/17 147 lb (66.7 kg)    General Appearance:   Alert, cooperative, no distress, appears stated age,  in good spirits.  Head:   Normocephalic, without obvious abnormality, atraumatic  Eyes:   PERRL, conjunctiva/corneas clear, EOM's intact, fundi  benign  Ears:   Normal TM's  and external ear canals. Nonobstructive hard cerumen bilaterally  Nose:  Nares normal, mucosa is mildly edematous with clear mucus; no sinus tenderness  Throat:  Lips, mucosa, and tongue normal; teeth and gums normal  Neck:  Supple, no lymphadenopathy; thyroid: noenlargement/ tenderness/nodules; no carotid bruit or JVD  Back:  Spine nontender, no curvature, ROM normal, no CVA tenderness  Lungs:   Clear to auscultation bilaterally without wheezes, rales orronchi; respirations unlabored  Chest Wall:   No tenderness or deformity  Heart:   Regular rate and rhythm, S1 and S2 normal, no murmur, rub  or gallop  Breast Exam:   No tenderness, nipple discharge or inversion. No axillary lymphadenopathy. WHSS at left lateral breast. No masses.  Abdomen:   Soft, non-tender, nondistended, normoactive bowel sounds,   no masses, no hepatosplenomegaly  Genitalia:   Normal external genitalia without lesions, mild atrophic changes. BUS and vagina normal; no cervical motion tenderness. No abnormal vaginal discharge. Uterus and adnexa not enlarged, nontender, no masses. Pap not performed  Rectal:   Normal tone, no masses or tenderness; no stool in vault for heme-testing  Extremities:  No clubbing, cyanosis or edema  Pulses:  2+ and symmetric all extremities  Skin:  Skin color, texture, turgor normal, no rashes or lesions. Some bruising noted at left upper arm  Lymph nodes:  Cervical, supraclavicular, and axillary nodes normal  Neurologic:  CNII-XII intact, normal strength, sensation and gait; reflexes 2+ and symmetric throughout  Psych: Normal mood, affect, hygiene and grooming  Urine dip: notable for 3+ leuks, otherwise negative. She denies urinary  symptoms as per ROS.   Recent labs from oncologist:  Lab Results  Component Value Date   WBC 3.8 (L) 02/10/2018   HGB 10.2 (L) 02/10/2018   HCT 30.8 (L) 02/10/2018   MCV 86.0 02/10/2018   PLT 205 02/10/2018     Chemistry      Component Value Date/Time   NA 141 02/10/2018 1226   NA 140 09/12/2017 1045   K 4.5 02/10/2018 1226   K 4.3 09/12/2017 1045   CL 105 02/10/2018 1226   CO2 30 (H) 02/10/2018 1226   CO2 24 09/12/2017 1045   BUN 14 02/10/2018 1226   BUN 16.3 09/12/2017 1045   CREATININE 0.81 02/10/2018 1226   CREATININE 0.8 09/12/2017 1045      Component Value Date/Time   CALCIUM 9.5 02/10/2018 1226   CALCIUM 9.5 09/12/2017 1045   ALKPHOS 68 02/10/2018 1226   ALKPHOS 67 09/12/2017 1045   AST 23 02/10/2018 1226   AST 21 09/12/2017 1045   ALT 14 02/10/2018 1226   ALT 17 09/12/2017 1045   BILITOT 0.3 02/10/2018 1226   BILITOT 0.26 09/12/2017 1045      ASSESSMENT/PLAN:  Annual physical exam - Plan: POCT Urinalysis DIP (Proadvantage Device), CBC with Differential/Platelet, Ferritin, Vitamin B12, Glucose, random  Bipolar disorder in full remission, most recent episode unspecified type ([place])  Other osteoporosis without current pathological fracture - on treatment per oncologist. Discussed Ca, D, weight-bearing exercise  Personal history of breast cancer  History of bleeding ulcers - in October. Continue PPI per GI. Avoid NSAIDs  History of Clostridium difficile infection - with recurrence.  Complete resolution of symptoms  Iron deficiency anemia, unspecified iron deficiency anemia type - anemia noted on recent labs--unsure if she had donated blood prior to this or not. 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 Immunization recommendations discussed--continue yearly flu shots. Pneumovax at age 69. Colonoscopy recommendations reviewed--UTD. F/u DEXA per onc. Pap q5 years, UTD  F/u 1 year, sooner prn

## 2018-04-13 ENCOUNTER — Encounter: Payer: Self-pay | Admitting: Family Medicine

## 2018-04-13 ENCOUNTER — Ambulatory Visit (INDEPENDENT_AMBULATORY_CARE_PROVIDER_SITE_OTHER): Payer: BLUE CROSS/BLUE SHIELD | Admitting: Family Medicine

## 2018-04-13 VITALS — BP 122/80 | HR 64 | Ht 66.5 in | Wt 149.2 lb

## 2018-04-13 DIAGNOSIS — Z853 Personal history of malignant neoplasm of breast: Secondary | ICD-10-CM

## 2018-04-13 DIAGNOSIS — M818 Other osteoporosis without current pathological fracture: Secondary | ICD-10-CM | POA: Diagnosis not present

## 2018-04-13 DIAGNOSIS — Z5181 Encounter for therapeutic drug level monitoring: Secondary | ICD-10-CM

## 2018-04-13 DIAGNOSIS — G620 Drug-induced polyneuropathy: Secondary | ICD-10-CM

## 2018-04-13 DIAGNOSIS — Z8619 Personal history of other infectious and parasitic diseases: Secondary | ICD-10-CM

## 2018-04-13 DIAGNOSIS — Z8711 Personal history of peptic ulcer disease: Secondary | ICD-10-CM

## 2018-04-13 DIAGNOSIS — F3170 Bipolar disorder, currently in remission, most recent episode unspecified: Secondary | ICD-10-CM | POA: Diagnosis not present

## 2018-04-13 DIAGNOSIS — Z0000 Encounter for general adult medical examination without abnormal findings: Secondary | ICD-10-CM

## 2018-04-13 DIAGNOSIS — T451X5A Adverse effect of antineoplastic and immunosuppressive drugs, initial encounter: Secondary | ICD-10-CM

## 2018-04-13 DIAGNOSIS — D509 Iron deficiency anemia, unspecified: Secondary | ICD-10-CM

## 2018-04-13 LAB — POCT URINALYSIS DIP (PROADVANTAGE DEVICE)
Bilirubin, UA: NEGATIVE
Blood, UA: NEGATIVE
Glucose, UA: NEGATIVE mg/dL
Ketones, POC UA: NEGATIVE mg/dL
Nitrite, UA: NEGATIVE
PH UA: 6.5 (ref 5.0–8.0)
PROTEIN UA: NEGATIVE mg/dL
SPECIFIC GRAVITY, URINE: 1.03
UUROB: NEGATIVE

## 2018-04-13 NOTE — Patient Instructions (Addendum)
  HEALTH MAINTENANCE RECOMMENDATIONS:  It is recommended that you get at least 30 minutes of aerobic exercise at least 5 days/week (for weight loss, you may need as much as 60-90 minutes). This can be any activity that gets your heart rate up. This can be divided in 10-15 minute intervals if needed, but try and build up your endurance at least once a week.  Weight bearing exercise is also recommended twice weekly.  Eat a healthy diet with lots of vegetables, fruits and fiber.  "Colorful" foods have a lot of vitamins (ie green vegetables, tomatoes, red peppers, etc).  Limit sweet tea, regular sodas and alcoholic beverages, all of which has a lot of calories and sugar.  Up to 1 alcoholic drink daily may be beneficial for women (unless trying to lose weight, watch sugars).  Drink a lot of water.  Calcium recommendations are 1200-1500 mg daily (1500 mg for postmenopausal women or women without ovaries), and vitamin D 1000 IU daily.  This should be obtained from diet and/or supplements (vitamins), and calcium should not be taken all at once, but in divided doses.  Monthly self breast exams and yearly mammograms for women over the age of 73 is recommended.  Sunscreen of at least SPF 30 should be used on all sun-exposed parts of the skin when outside between the hours of 10 am and 4 pm (not just when at beach or pool, but even with exercise, golf, tennis, and yard work!)  Use a sunscreen that says "broad spectrum" so it covers both UVA and UVB rays, and make sure to reapply every 1-2 hours.  Remember to change the batteries in your smoke detectors when changing your clock times in the spring and fall.  Use your seat belt every time you are in a car, and please drive safely and not be distracted with cell phones and texting while driving.  You should be due for another bone density in September (I'm sure your oncologist will be ordering this for you).  Be sure to get regular weight-bearing and aerobic  exercise as we discussed.  I recommend routine skin cancer screening (due to history of breast cancer and radiation).

## 2018-04-14 LAB — CBC WITH DIFFERENTIAL/PLATELET
BASOS: 1 %
Basophils Absolute: 0 10*3/uL (ref 0.0–0.2)
EOS (ABSOLUTE): 0.1 10*3/uL (ref 0.0–0.4)
Eos: 3 %
HEMATOCRIT: 35.8 % (ref 34.0–46.6)
HEMOGLOBIN: 11.1 g/dL (ref 11.1–15.9)
IMMATURE GRANS (ABS): 0 10*3/uL (ref 0.0–0.1)
Immature Granulocytes: 0 %
LYMPHS ABS: 1 10*3/uL (ref 0.7–3.1)
Lymphs: 18 %
MCH: 26.4 pg — ABNORMAL LOW (ref 26.6–33.0)
MCHC: 31 g/dL — AB (ref 31.5–35.7)
MCV: 85 fL (ref 79–97)
Monocytes Absolute: 0.4 10*3/uL (ref 0.1–0.9)
Monocytes: 8 %
Neutrophils Absolute: 3.8 10*3/uL (ref 1.4–7.0)
Neutrophils: 70 %
Platelets: 281 10*3/uL (ref 150–450)
RBC: 4.21 x10E6/uL (ref 3.77–5.28)
RDW: 15.8 % — ABNORMAL HIGH (ref 12.3–15.4)
WBC: 5.4 10*3/uL (ref 3.4–10.8)

## 2018-04-14 LAB — GLUCOSE, RANDOM: Glucose: 98 mg/dL (ref 65–99)

## 2018-04-14 LAB — FERRITIN: Ferritin: 10 ng/mL — ABNORMAL LOW (ref 15–150)

## 2018-04-14 LAB — VITAMIN B12: Vitamin B-12: 414 pg/mL (ref 232–1245)

## 2018-04-27 ENCOUNTER — Ambulatory Visit (INDEPENDENT_AMBULATORY_CARE_PROVIDER_SITE_OTHER): Payer: BLUE CROSS/BLUE SHIELD | Admitting: Podiatry

## 2018-04-27 ENCOUNTER — Encounter: Payer: Self-pay | Admitting: Podiatry

## 2018-04-27 ENCOUNTER — Encounter

## 2018-04-27 ENCOUNTER — Ambulatory Visit (INDEPENDENT_AMBULATORY_CARE_PROVIDER_SITE_OTHER): Payer: BLUE CROSS/BLUE SHIELD

## 2018-04-27 DIAGNOSIS — M722 Plantar fascial fibromatosis: Secondary | ICD-10-CM

## 2018-04-27 MED ORDER — TRIAMCINOLONE ACETONIDE 10 MG/ML IJ SUSP
10.0000 mg | Freq: Once | INTRAMUSCULAR | Status: AC
  Administered 2018-04-27: 10 mg

## 2018-04-27 NOTE — Patient Instructions (Signed)

## 2018-04-29 NOTE — Progress Notes (Signed)
Subjective:   Patient ID: Rhonda Gardner, female   DOB: 62 y.o.   MRN: [id number]   HPI Patient presents stating the bottom of my left heel has been killing me for the last month.  I do not remember specific injury and its just been difficult especially after periods of sitting and after I been on my foot   ROS      Objective:  Physical Exam  Neurovascular status intact muscle strength adequate with patient's left plantar heel being very tender in the medial band at the insertional point of the tendon into the calcaneus     Assessment:  Acute plantar fasciitis left with inflammation fluid around the medial band     Plan:  H&P condition reviewed and today I reviewed x-ray with patient.  I did go ahead and I injected the left plantar fascia today 3 mg Kenalog 5 mg Xylocaine applied fascial brace advised on shoe gear modifications and physical therapy and reappoint again in 2 weeks  X-ray indicates that there is small spur with moderate cavus foot structure with no indications of stress fracture or other pathology

## 2018-05-11 ENCOUNTER — Ambulatory Visit (INDEPENDENT_AMBULATORY_CARE_PROVIDER_SITE_OTHER): Payer: BLUE CROSS/BLUE SHIELD | Admitting: Podiatry

## 2018-05-11 ENCOUNTER — Encounter: Payer: Self-pay | Admitting: Podiatry

## 2018-05-11 DIAGNOSIS — M722 Plantar fascial fibromatosis: Secondary | ICD-10-CM | POA: Diagnosis not present

## 2018-06-29 ENCOUNTER — Emergency Department (HOSPITAL_COMMUNITY): Payer: BLUE CROSS/BLUE SHIELD

## 2018-06-29 ENCOUNTER — Emergency Department (HOSPITAL_COMMUNITY)
Admission: EM | Admit: 2018-06-29 | Discharge: 2018-06-30 | Disposition: A | Discharge status: Home / Self Care | Payer: BLUE CROSS/BLUE SHIELD | Attending: Emergency Medicine | Admitting: Emergency Medicine

## 2018-06-29 ENCOUNTER — Other Ambulatory Visit: Payer: Self-pay

## 2018-06-29 ENCOUNTER — Encounter (HOSPITAL_COMMUNITY): Payer: Self-pay

## 2018-06-29 DIAGNOSIS — K819 Cholecystitis, unspecified: Secondary | ICD-10-CM

## 2018-06-29 DIAGNOSIS — R1013 Epigastric pain: Secondary | ICD-10-CM

## 2018-06-29 DIAGNOSIS — R109 Unspecified abdominal pain: Secondary | ICD-10-CM | POA: Diagnosis present

## 2018-06-29 DIAGNOSIS — K859 Acute pancreatitis without necrosis or infection, unspecified: Secondary | ICD-10-CM | POA: Diagnosis not present

## 2018-06-29 HISTORY — DX: Plantar fascial fibromatosis: M72.2

## 2018-06-29 LAB — CBC
HCT: 33 % — ABNORMAL LOW (ref 36.0–46.0)
Hemoglobin: 10.3 g/dL — ABNORMAL LOW (ref 12.0–15.0)
MCH: 29.3 pg (ref 26.0–34.0)
MCHC: 31.2 g/dL (ref 30.0–36.0)
MCV: 93.8 fL (ref 78.0–100.0)
PLATELETS: 205 10*3/uL (ref 150–400)
RBC: 3.52 MIL/uL — ABNORMAL LOW (ref 3.87–5.11)
RDW: 18.6 % — AB (ref 11.5–15.5)
WBC: 9.1 10*3/uL (ref 4.0–10.5)

## 2018-06-29 LAB — COMPREHENSIVE METABOLIC PANEL
ALT: 67 U/L — ABNORMAL HIGH (ref 0–44)
ANION GAP: 11 (ref 5–15)
AST: 190 U/L — AB (ref 15–41)
Albumin: 3.5 g/dL (ref 3.5–5.0)
Alkaline Phosphatase: 70 U/L (ref 38–126)
BUN: 27 mg/dL — AB (ref 8–23)
CALCIUM: 8.8 mg/dL — AB (ref 8.9–10.3)
CO2: 23 mmol/L (ref 22–32)
CREATININE: 0.85 mg/dL (ref 0.44–1.00)
Chloride: 105 mmol/L (ref 98–111)
GLUCOSE: 112 mg/dL — AB (ref 70–99)
Potassium: 3.6 mmol/L (ref 3.5–5.1)
SODIUM: 139 mmol/L (ref 135–145)
TOTAL PROTEIN: 5.7 g/dL — AB (ref 6.5–8.1)
Total Bilirubin: 0.5 mg/dL (ref 0.3–1.2)

## 2018-06-29 LAB — URINALYSIS, ROUTINE W REFLEX MICROSCOPIC
BILIRUBIN URINE: NEGATIVE
Glucose, UA: NEGATIVE mg/dL
HGB URINE DIPSTICK: NEGATIVE
KETONES UR: 5 mg/dL — AB
Leukocytes, UA: NEGATIVE
NITRITE: NEGATIVE
PROTEIN: NEGATIVE mg/dL
Specific Gravity, Urine: 1.015 (ref 1.005–1.030)
pH: 7 (ref 5.0–8.0)

## 2018-06-29 LAB — I-STAT TROPONIN, ED: TROPONIN I, POC: 0 ng/mL (ref 0.00–0.08)

## 2018-06-29 LAB — LIPASE, BLOOD: Lipase: 199 U/L — ABNORMAL HIGH (ref 11–51)

## 2018-06-29 MED ORDER — IOHEXOL 300 MG/ML  SOLN
100.0000 mL | Freq: Once | INTRAMUSCULAR | Status: AC | PRN
  Administered 2018-06-29: 100 mL via INTRAVENOUS

## 2018-06-29 MED ORDER — SODIUM CHLORIDE 0.9 % IV BOLUS
1000.0000 mL | Freq: Once | INTRAVENOUS | Status: AC
  Administered 2018-06-29: 1000 mL via INTRAVENOUS

## 2018-06-29 MED ORDER — HYDROMORPHONE HCL 1 MG/ML IJ SOLN
0.5000 mg | Freq: Once | INTRAMUSCULAR | Status: AC
  Administered 2018-06-29: 0.5 mg via INTRAVENOUS
  Filled 2018-06-29: qty 1

## 2018-06-29 MED ORDER — ONDANSETRON HCL 4 MG/2ML IJ SOLN
4.0000 mg | Freq: Once | INTRAMUSCULAR | Status: DC
  Filled 2018-06-29: qty 2

## 2018-06-29 NOTE — ED Provider Notes (Signed)
[facility] Emergency Department Provider Note MRN:  [id number]  Arrival date & time: 06/29/18     Chief Complaint   Abdominal Pain   History of Present Illness   Rhonda Gardner is a 62 y.o. year-old female with a history of gastric ulcers presenting to the ED with chief complaint of abdominal pain.  Sudden onset severe epigastric abdominal pain 1 hour ago.  15 out of 10 in severity, described as sharp, radiating to the thoracic back.  Associated with nausea but no vomiting.  Denies headache or vision change, no chest pain or shortness of breath, no lower abdominal pain, no vaginal bleeding or discharge.  Pain has mildly improved with time but is now getting worse.  No exacerbating or alleviating factors.  Patient is taking iron and is unsure if she has blood in her stool because the iron makes her stool black.  Review of Systems  A complete 10 system review of systems was obtained and all systems are negative except as noted in the HPI and PMH.   Patient's Health History    Past Medical History:  Diagnosis Date  . Allergy 2006   chemical cauterization in spring 2006, and 2nd treatment with good results.(Dr.Shoemaker)  . Bipolar disorder ([place])   . Bleeding ulcer   . Breast cancer ([place]) 12/2014   invasive ductal carcinoma (LEFT)  . Breast cancer of upper-outer quadrant of left female breast ([place]) 12/22/2014  . Breast hematoma 12/19/2015   had left breast aspiration of 4cm hematoma-path indicates benign hematoma  . C. difficile colitis   . C. difficile diarrhea   . Osteoporosis 06/20/2016   T-2.5@spine  on Dexa ([facility])  . Plantar fasciitis   . PONV (postoperative nausea and vomiting)   . Symptomatic anemia 07/16/2017    Past Surgical History:  Procedure Laterality Date  . BREAST BIOPSY Bilateral    benign and a right stereotatic breast biopsy.  . ESOPHAGOGASTRODUODENOSCOPY N/A 07/16/2017   Procedure: ESOPHAGOGASTRODUODENOSCOPY (EGD);  Surgeon: Ladene Artist, MD;  Location: [facility] ENDOSCOPY;  Service: Endoscopy;  Laterality: N/A;  . fibroid excision  age 82   prolapsed fibroid removed  . FOOT SURGERY Left 2014   Dr. Paulla Dolly  . FOOT SURGERY Right 08/2016   Dr. Paulla Dolly --Shortening Osteotomy with Fixation 1st Metatarsal   . MOUTH SURGERY  06/2017   CYST   . PORT-A-CATH REMOVAL N/A 02/15/2016   Procedure: REMOVAL PORT-A-CATH;  Surgeon: Autumn Messing III, MD;  Location: [place];  Service: General;  Laterality: N/A;  . RADIOACTIVE SEED GUIDED PARTIAL MASTECTOMY WITH AXILLARY SENTINEL LYMPH NODE BIOPSY Left 05/25/2015   Procedure: LEFT BREAST LUMPECTOMY WITH RADIOACTIVE SEED AND SENTINEL LYMPH NODE BIOPSY;  Surgeon: Autumn Messing III, MD;  Location: [place];  Service: General;  Laterality: Left;    Family History  Problem Relation Age of Onset  . Heart disease Father   . Skin cancer Father        non melanoma - farmer  . Hypertension Mother   . Arthritis Mother        osteoarthritis  . Macular degeneration Mother        wet and dry  . Colon cancer Mother 74       colon cancer at age 49  . Osteoporosis Sister        osteopenia (states it resolved)  . Lung cancer Maternal Aunt        lung cancer  . Cancer Paternal Aunt 43       ?  stomach cancer  . Autism Sister   . Mental retardation Sister   . Blindness Sister        secondary to retrolental fibroplasia  . Osteoporosis Sister   . Goiter Paternal Aunt   . Diabetes Paternal Aunt     Social History   Socioeconomic History  . Marital status: Married    Spouse name: Not on file  . Number of children: Not on file  . Years of education: Not on file  . Highest education level: Not on file  Occupational History  . Not on file  Social Needs  . Financial resource strain: Not on file  . Food insecurity:    Worry: Not on file    Inability: Not on file  . Transportation needs:    Medical: Not on file    Non-medical: Not on file  Tobacco Use  . Smoking  status: Never Smoker  . Smokeless tobacco: Never Used  Substance and Sexual Activity  . Alcohol use: Yes    Alcohol/week: 0.0 standard drinks    Comment: 1-2 glasses of wine a month  . Drug use: No  . Sexual activity: Yes    Partners: Male    Comment: postmenopausal  Lifestyle  . Physical activity:    Days per week: Not on file    Minutes per session: Not on file  . Stress: Not on file  Relationships  . Social connections:    Talks on phone: Not on file    Gets together: Not on file    Attends religious service: Not on file    Active member of club or organization: Not on file    Attends meetings of clubs or organizations: Not on file    Relationship status: Not on file  . Intimate partner violence:    Fear of current or ex partner: Not on file    Emotionally abused: Not on file    Physically abused: Not on file    Forced sexual activity: Not on file  Other Topics Concern  . Not on file  Social History Narrative   Married Rhonda Gardner), 1 dog (chocolate lab). Homemaker     Physical Exam  Vital Signs and Nursing Notes reviewed Vitals:   06/29/18 1900 06/29/18 2126  BP: 122/60 (!) 124/55  Pulse: 75 72  Resp: 13 20  Temp:    SpO2: 97% 100%    CONSTITUTIONAL: Well-appearing, NAD NEURO:  Alert and oriented x 3, no focal deficits EYES:  eyes equal and reactive ENT/NECK:  no LAD, no JVD CARDIO: Regular rate, well-perfused, normal S1 and S2 PULM:  CTAB no wheezing or rhonchi GI/GU:  normal bowel sounds, non-distended, non-tender MSK/SPINE:  No gross deformities, moderate tenderness palpation to the epigastrium SKIN:  no rash, atraumatic PSYCH:  Appropriate speech and behavior  Diagnostic and Interventional Summary    EKG Interpretation  Date/Time:  Monday June 29 2018 18:00:52 EDT Ventricular Rate:  74 PR Interval:    QRS Duration: 110 QT Interval:  396 QTC Calculation: 440 R Axis:   35 Text Interpretation:  Sinus rhythm Left atrial enlargement Low voltage,  precordial leads RSR' in V1 or V2, probably normal variant Confirmed by Gerlene Fee [phone]) on 06/29/2018 6:07:20 PM      Labs Reviewed  LIPASE, BLOOD - Abnormal; Notable for the following components:      Result Value   Lipase 199 (*)    All other components within normal limits  COMPREHENSIVE METABOLIC PANEL - Abnormal; Notable for the  following components:   Glucose, Bld 112 (*)    BUN 27 (*)    Calcium 8.8 (*)    Total Protein 5.7 (*)    AST 190 (*)    ALT 67 (*)    All other components within normal limits  CBC - Abnormal; Notable for the following components:   RBC 3.52 (*)    Hemoglobin 10.3 (*)    HCT 33.0 (*)    RDW 18.6 (*)    All other components within normal limits  URINALYSIS, ROUTINE W REFLEX MICROSCOPIC - Abnormal; Notable for the following components:   Ketones, ur 5 (*)    All other components within normal limits  I-STAT TROPONIN, ED    CT ABDOMEN PELVIS W CONTRAST    (Results Pending)    Medications  ondansetron (ZOFRAN) injection 4 mg (0 mg Intravenous Hold 06/29/18 2131)  sodium chloride 0.9 % bolus 1,000 mL (0 mLs Intravenous Stopped 06/29/18 2131)  HYDROmorphone (DILAUDID) injection 0.5 mg (0.5 mg Intravenous Given 06/29/18 1910)     Procedures Critical Care  ED Course and Medical Decision Making  I have reviewed the triage vital signs and the nursing notes.  Pertinent labs & imaging results that were available during my care of the patient were reviewed by me and considered in my medical decision making (see below for details).  Concern for possible perforated viscus in this 62 year old female with history of gastric ulcers.  Also considering pancreatitis, less likely aortic pathology.  Labs, CT pending.  Labs reveal very mildly elevated AST and ALT, mildly elevated lipase to suggest a mild case of pancreatitis.  Upon reassessment patient has feeling very well, sitting up in bed, well-appearing, reassuring abdominal exam.  CT abdomen still to be  performed.  If no acute findings are discovered, will need reassessment and then can consider outpatient management of this mild case of pancreatitis.  Patient was informed of this possibility and advised to restrict her diet to clear liquids for the next day and slowly progress to normal diet.  Follow closely with PCP.  Patient signed out to Rhonda Gardner at shift change.  Barth Kirks. [place]onia Small, [place] [email or web address]  Final Clinical Impressions(s) / ED Diagnoses     ICD-10-CM   1. Acute pancreatitis, unspecified complication status, unspecified pancreatitis type K85.90     ED Discharge Orders    None         Maudie Flakes, MD 06/29/18 2208

## 2018-06-29 NOTE — ED Notes (Signed)
Pt states her nausea has resolved and would like to wait to take zofran

## 2018-06-29 NOTE — ED Notes (Signed)
Pt vomited after dilaudid administration, EDP made aware and order for zofran given

## 2018-06-29 NOTE — ED Notes (Signed)
ED Provider at bedside. 

## 2018-06-29 NOTE — ED Notes (Signed)
Pt updated on wait time for CT, Ct stated "there are a few in front of her"

## 2018-06-29 NOTE — ED Triage Notes (Signed)
Pt BIB ems for abd pain that started 1 hour ago, rates the pain 10/10, pt has hx of bleeding ulcer and states it feels similar. Pain radiates to the back. Initial BP 80/40 and now 110/70 after 539ml fluid. HR 70. Pt a.o upon arrival, nad

## 2018-06-30 ENCOUNTER — Telehealth: Payer: Self-pay | Admitting: Gastroenterology

## 2018-06-30 ENCOUNTER — Emergency Department (HOSPITAL_COMMUNITY): Payer: BLUE CROSS/BLUE SHIELD

## 2018-06-30 MED ORDER — AMOXICILLIN-POT CLAVULANATE 875-125 MG PO TABS: 1.0000 | ORAL_TABLET | Freq: Two times a day (BID) | ORAL | 0 refills | Status: DC

## 2018-06-30 MED ORDER — PIPERACILLIN-TAZOBACTAM 3.375 G IVPB 30 MIN
3.3750 g | Freq: Once | INTRAVENOUS | Status: AC
  Administered 2018-06-30: 3.375 g via INTRAVENOUS
  Filled 2018-06-30: qty 50

## 2018-06-30 MED ORDER — ONDANSETRON 4 MG PO TBDP: 4.0000 mg | ORAL_TABLET | Freq: Three times a day (TID) | ORAL | 0 refills | Status: DC | PRN

## 2018-06-30 NOTE — ED Notes (Signed)
Patient transported to Ultrasound 

## 2018-06-30 NOTE — Consult Note (Signed)
Reason for Consult:abd pain Referring Physician: Deion Forgue Cox-Fishman is an 62 y.o. female.  HPI: 82 yof who has history of breast cancer, c diff, prior du  Requiring egd.  She had sudden onset epigastric abdominal pain today. Never had this before.  She had no n/v. Nothing was making better.   No change in bms.  She now has no pain and all of this completely resolved while in the er.  She had ct and [place] with concern for acalculous cholecystitis and a mildly elevated lipase.   Past Medical History:  Diagnosis Date  . Allergy 2006   chemical cauterization in spring 2006, and 2nd treatment with good results.(Dr.Shoemaker)  . Bipolar disorder ([place])   . Bleeding ulcer   . Breast cancer ([place]) 12/2014   invasive ductal carcinoma (LEFT)  . Breast cancer of upper-outer quadrant of left female breast ([place]) 12/22/2014  . Breast hematoma 12/19/2015   had left breast aspiration of 4cm hematoma-path indicates benign hematoma  . C. difficile colitis   . C. difficile diarrhea   . Osteoporosis 06/20/2016   T-2.5'@spine'  on Dexa ([facility])  . Plantar fasciitis   . PONV (postoperative nausea and vomiting)   . Symptomatic anemia 07/16/2017    Past Surgical History:  Procedure Laterality Date  . BREAST BIOPSY Bilateral    benign and a right stereotatic breast biopsy.  . ESOPHAGOGASTRODUODENOSCOPY N/A 07/16/2017   Procedure: ESOPHAGOGASTRODUODENOSCOPY (EGD);  Surgeon: Ladene Artist, MD;  Location: [facility] ENDOSCOPY;  Service: Endoscopy;  Laterality: N/A;  . fibroid excision  age 15   prolapsed fibroid removed  . FOOT SURGERY Left 2014   Dr. Paulla Dolly  . FOOT SURGERY Right 08/2016   Dr. Paulla Dolly --Shortening Osteotomy with Fixation 1st Metatarsal   . MOUTH SURGERY  06/2017   CYST   . PORT-A-CATH REMOVAL N/A 02/15/2016   Procedure: REMOVAL PORT-A-CATH;  Surgeon: Autumn Messing III, MD;  Location: [address];  Service: General;  Laterality: N/A;  . RADIOACTIVE SEED GUIDED PARTIAL MASTECTOMY  WITH AXILLARY SENTINEL LYMPH NODE BIOPSY Left 05/25/2015   Procedure: LEFT BREAST LUMPECTOMY WITH RADIOACTIVE SEED AND SENTINEL LYMPH NODE BIOPSY;  Surgeon: Autumn Messing III, MD;  Location: [place];  Service: General;  Laterality: Left;    Family History  Problem Relation Age of Onset  . Heart disease Father   . Skin cancer Father        non melanoma - farmer  . Hypertension Mother   . Arthritis Mother        osteoarthritis  . Macular degeneration Mother        wet and dry  . Colon cancer Mother 8       colon cancer at age 38  . Osteoporosis Sister        osteopenia (states it resolved)  . Lung cancer Maternal Aunt        lung cancer  . Cancer Paternal Aunt 77       ? stomach cancer  . Autism Sister   . Mental retardation Sister   . Blindness Sister        secondary to retrolental fibroplasia  . Osteoporosis Sister   . Goiter Paternal Aunt   . Diabetes Paternal Aunt     Social History:  reports that she has never smoked. She has never used smokeless tobacco. She reports that she drinks alcohol. She reports that she does not use drugs.  Allergies:  Allergies  Allergen Reactions  .  Clindamycin/Lincomycin Diarrhea    c diff infection  . Nsaids     hc of bleeding ulcer   . Adhesive [Tape] Rash    Pt is sensitive to any kind of adhesive tape products.    Medications: I have reviewed the patient's current medications.  Results for orders placed or performed during the hospital encounter of 06/29/18 (from the past 48 hour(s))  Lipase, blood     Status: Abnormal   Collection Time: 06/29/18  6:08 PM  Result Value Ref Range   Lipase 199 (H) 11 - 51 U/L    Comment: Performed at [facility], [place] [address] [place], [place] [address]  Comprehensive metabolic panel     Status: Abnormal   Collection Time: 06/29/18  6:08 PM  Result Value Ref Range   Sodium 139 135 - 145 mmol/L   Potassium 3.6 3.5 - 5.1 mmol/L   Chloride 105 98 - 111 mmol/L   CO2 23 22  - 32 mmol/L   Glucose, Bld 112 (H) 70 - 99 mg/dL   BUN 27 (H) 8 - 23 mg/dL   Creatinine, Ser 0.85 0.44 - 1.00 mg/dL   Calcium 8.8 (L) 8.9 - 10.3 mg/dL   Total Protein 5.7 (L) 6.5 - 8.1 g/dL   Albumin 3.5 3.5 - 5.0 g/dL   AST 190 (H) 15 - 41 U/L   ALT 67 (H) 0 - 44 U/L   Alkaline Phosphatase 70 38 - 126 U/L   Total Bilirubin 0.5 0.3 - 1.2 mg/dL   GFR calc non Af Amer >60 >60 mL/min   GFR calc Af Amer >60 >60 mL/min    Comment: (NOTE) The eGFR has been calculated using the CKD EPI equation. This calculation has not been validated in all clinical situations. eGFR's persistently <60 mL/min signify possible Chronic Kidney Disease.    Anion gap 11 5 - 15    Comment: Performed at [facility] [address], [place], [place] [address]  CBC     Status: Abnormal   Collection Time: 06/29/18  6:08 PM  Result Value Ref Range   WBC 9.1 4.0 - 10.5 K/uL   RBC 3.52 (L) 3.87 - 5.11 MIL/uL   Hemoglobin 10.3 (L) 12.0 - 15.0 g/dL   HCT 33.0 (L) 36.0 - 46.0 %   MCV 93.8 78.0 - 100.0 fL   MCH 29.3 26.0 - 34.0 pg   MCHC 31.2 30.0 - 36.0 g/dL   RDW 18.6 (H) 11.5 - 15.5 %   Platelets 205 150 - 400 K/uL    Comment: Performed at [facility] [address] [place], [place] [address]  I-stat troponin, ED     Status: None   Collection Time: 06/29/18  7:17 PM  Result Value Ref Range   Troponin i, poc 0.00 0.00 - 0.08 ng/mL   Comment 3            Comment: Due to the release kinetics of cTnI, a negative result within the first hours of the onset of symptoms does not rule out myocardial infarction with certainty. If myocardial infarction is still suspected, repeat the test at appropriate intervals.   Urinalysis, Routine w reflex microscopic     Status: Abnormal   Collection Time: 06/29/18  7:33 PM  Result Value Ref Range   Color, Urine YELLOW YELLOW   APPearance CLEAR CLEAR   Specific Gravity, Urine 1.015 1.005 - 1.030   pH 7.0 5.0 - 8.0   Glucose, UA  NEGATIVE NEGATIVE mg/dL   Hgb  urine dipstick NEGATIVE NEGATIVE   Bilirubin Urine NEGATIVE NEGATIVE   Ketones, ur 5 (A) NEGATIVE mg/dL   Protein, ur NEGATIVE NEGATIVE mg/dL   Nitrite NEGATIVE NEGATIVE   Leukocytes, UA NEGATIVE NEGATIVE    Comment: Performed at [facility] [address], [place], [place] [address]    Ct Abdomen Pelvis W Contrast  Result Date: 06/30/2018 CLINICAL DATA:  Sudden onset severe epigastric abdominal pain in our ago. Nausea. EXAM: CT ABDOMEN AND PELVIS WITH CONTRAST TECHNIQUE: Multidetector CT imaging of the abdomen and pelvis was performed using the standard protocol following bolus administration of intravenous contrast. CONTRAST:  111m OMNIPAQUE IOHEXOL 300 MG/ML  SOLN COMPARISON:  None. FINDINGS: Lower chest: The lung bases are clear. Hepatobiliary: Subcentimeter low-attenuation lesion in the dome of the liver is too small to characterize but probably a cyst or hemangioma. Gallbladder wall is thickened with mild pericholecystic edema. No stones are identified but this could indicate cholecystitis. No bile duct dilatation. Pancreas: Unremarkable. No pancreatic ductal dilatation or surrounding inflammatory changes. Spleen: Normal in size without focal abnormality. Adrenals/Urinary Tract: No adrenal gland nodules. Renal nephrograms are symmetrical and homogeneous. Prominent parapelvic cysts in the left kidney. No hydronephrosis or hydroureter. Bladder is unremarkable. Stomach/Bowel: Stomach, small bowel, and colon are not abnormally distended. Opaque medication in the duodenum and cecum. Scattered stool in the colon. No inflammatory infiltration. No discrete wall thickening appreciated. Appendix is normal. Vascular/Lymphatic: No significant vascular findings are present. No enlarged abdominal or pelvic lymph nodes. Reproductive: Uterus and bilateral adnexa are unremarkable. Other: No free air or free fluid in the abdomen. Abdominal wall musculature appears intact. Musculoskeletal: Tarlov cysts in the  sacrum. No destructive bone lesions. IMPRESSION: 1. No evidence of bowel obstruction or inflammation. Appendix is normal. 2. Gallbladder wall is thickened and there is mild pericholecystic edema. No stones are identified but this could indicate cholecystitis in the appropriate clinical setting. Consider ultrasound for further evaluation if clinically indicated. 3. Prominent parapelvic cysts in the left kidney. No hydronephrosis or hydroureter. 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 Electronically Signed   By: WLucienne CapersM.D.   On: 06/30/2018 00:03   UK[place]Abdomen Limited Ruq  Result Date: 06/30/2018 CLINICAL DATA:  Abnormal gallbladder seen on recent CT. EXAM: ULTRASOUND ABDOMEN LIMITED RIGHT UPPER QUADRANT COMPARISON:  CT study from 06/29/2018 FINDINGS: Gallbladder: Distended gallbladder with top normal gallbladder wall thickness of 3 mm. No gallstones are identified. Trace pericholecystic fluid. Common bile duct: Diameter: 2.9 mm proximally and normal. Liver: No focal lesion identified. Within normal limits in parenchymal echogenicity. Portal vein is patent on color Doppler imaging with normal direction of blood flow towards the liver. IMPRESSION: The gallbladder is distended and there is a top-normal thickness of the gallbladder wall to 3 mm. Trace pericholecystic fluid is identified. No gallstones are identified however. Findings may represent an acalculous cholecystitis. HIDA scan may help for further correlation if findings are discordant from clinical exam/suspicion. Electronically Signed   By: DAshley RoyaltyM.D.   On: 06/30/2018 01:05    Review of Systems  Gastrointestinal: Positive for abdominal pain.  All other systems reviewed and are negative.  Blood pressure 118/75, pulse 66, temperature 98.1 F (36.7 C), temperature source Oral, resp. rate 15, height '5\' 6"'  (1.676 m), weight 64.4 kg, SpO2 96 %. Physical Exam  Constitutional: She is oriented to person, place, and time. She appears well-developed  and well-nourished.  Eyes: No scleral icterus.  Cardiovascular: Normal  rate.  Respiratory: Effort normal.  GI: Soft. She exhibits no distension. There is no tenderness.  Neurological: She is alert and oriented to person, place, and time.    Assessment/Plan: Possible acalculous cholecystitis  She has no pain or tenderness now. I think reasonable to send home on course of abx with gi follow up planned. Discussed could happen again but she would like to go home at this point.  Will have her follow up with Gi as she has seen Dr Fuller Plan before. I think reasonable to consider an eus at some point in near future.   Rolm Bookbinder 06/30/2018, 2:04 AM

## 2018-06-30 NOTE — ED Provider Notes (Signed)
Assumed care from Dr. Sedonia Small at shift change.  See prior note for full H&P.  Briefly, 62 year old female here with epigastric abdominal pain with radiation to the back.  Reports associated nausea but no vomiting.  Labs with elevated lipase as well as mild elevation of LFTs.  Plan: CT pending.  Results for orders placed or performed during the hospital encounter of 06/29/18  Lipase, blood  Result Value Ref Range   Lipase 199 (H) 11 - 51 U/L  Comprehensive metabolic panel  Result Value Ref Range   Sodium 139 135 - 145 mmol/L   Potassium 3.6 3.5 - 5.1 mmol/L   Chloride 105 98 - 111 mmol/L   CO2 23 22 - 32 mmol/L   Glucose, Bld 112 (H) 70 - 99 mg/dL   BUN 27 (H) 8 - 23 mg/dL   Creatinine, Ser 0.85 0.44 - 1.00 mg/dL   Calcium 8.8 (L) 8.9 - 10.3 mg/dL   Total Protein 5.7 (L) 6.5 - 8.1 g/dL   Albumin 3.5 3.5 - 5.0 g/dL   AST 190 (H) 15 - 41 U/L   ALT 67 (H) 0 - 44 U/L   Alkaline Phosphatase 70 38 - 126 U/L   Total Bilirubin 0.5 0.3 - 1.2 mg/dL   GFR calc non Af Amer >60 >60 mL/min   GFR calc Af Amer >60 >60 mL/min   Anion gap 11 5 - 15  CBC  Result Value Ref Range   WBC 9.1 4.0 - 10.5 K/uL   RBC 3.52 (L) 3.87 - 5.11 MIL/uL   Hemoglobin 10.3 (L) 12.0 - 15.0 g/dL   HCT 33.0 (L) 36.0 - 46.0 %   MCV 93.8 78.0 - 100.0 fL   MCH 29.3 26.0 - 34.0 pg   MCHC 31.2 30.0 - 36.0 g/dL   RDW 18.6 (H) 11.5 - 15.5 %   Platelets 205 150 - 400 K/uL  Urinalysis, Routine w reflex microscopic  Result Value Ref Range   Color, Urine YELLOW YELLOW   APPearance CLEAR CLEAR   Specific Gravity, Urine 1.015 1.005 - 1.030   pH 7.0 5.0 - 8.0   Glucose, UA NEGATIVE NEGATIVE mg/dL   Hgb urine dipstick NEGATIVE NEGATIVE   Bilirubin Urine NEGATIVE NEGATIVE   Ketones, ur 5 (A) NEGATIVE mg/dL   Protein, ur NEGATIVE NEGATIVE mg/dL   Nitrite NEGATIVE NEGATIVE   Leukocytes, UA NEGATIVE NEGATIVE  I-stat troponin, ED  Result Value Ref Range   Troponin i, poc 0.00 0.00 - 0.08 ng/mL   Comment 3           Ct  Abdomen Pelvis W Contrast  Result Date: 06/30/2018 CLINICAL DATA:  Sudden onset severe epigastric abdominal pain in our ago. Nausea. EXAM: CT ABDOMEN AND PELVIS WITH CONTRAST TECHNIQUE: Multidetector CT imaging of the abdomen and pelvis was performed using the standard protocol following bolus administration of intravenous contrast. CONTRAST:  161mL OMNIPAQUE IOHEXOL 300 MG/ML  SOLN COMPARISON:  None. FINDINGS: Lower chest: The lung bases are clear. Hepatobiliary: Subcentimeter low-attenuation lesion in the dome of the liver is too small to characterize but probably a cyst or hemangioma. Gallbladder wall is thickened with mild pericholecystic edema. No stones are identified but this could indicate cholecystitis. No bile duct dilatation. Pancreas: Unremarkable. No pancreatic ductal dilatation or surrounding inflammatory changes. Spleen: Normal in size without focal abnormality. Adrenals/Urinary Tract: No adrenal gland nodules. Renal nephrograms are symmetrical and homogeneous. Prominent parapelvic cysts in the left kidney. No hydronephrosis or hydroureter. Bladder is unremarkable. Stomach/Bowel: Stomach, small  bowel, and colon are not abnormally distended. Opaque medication in the duodenum and cecum. Scattered stool in the colon. No inflammatory infiltration. No discrete wall thickening appreciated. Appendix is normal. Vascular/Lymphatic: No significant vascular findings are present. No enlarged abdominal or pelvic lymph nodes. Reproductive: Uterus and bilateral adnexa are unremarkable. Other: No free air or free fluid in the abdomen. Abdominal wall musculature appears intact. Musculoskeletal: Tarlov cysts in the sacrum. No destructive bone lesions. IMPRESSION: 1. No evidence of bowel obstruction or inflammation. Appendix is normal. 2. Gallbladder wall is thickened and there is mild pericholecystic edema. No stones are identified but this could indicate cholecystitis in the appropriate clinical setting. Consider  ultrasound for further evaluation if clinically indicated. 3. Prominent parapelvic cysts in the left kidney. No hydronephrosis or hydroureter. 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 Electronically Signed   By: Lucienne Capers M.D.   On: 06/30/2018 00:03   US Abdomen Limited Ruq  Result Date: 06/30/2018 CLINICAL DATA:  Abnormal gallbladder seen on recent CT. EXAM: ULTRASOUND ABDOMEN LIMITED RIGHT UPPER QUADRANT COMPARISON:  CT study from 06/29/2018 FINDINGS: Gallbladder: Distended gallbladder with top normal gallbladder wall thickness of 3 mm. No gallstones are identified. Trace pericholecystic fluid. Common bile duct: Diameter: 2.9 mm proximally and normal. Liver: No focal lesion identified. Within normal limits in parenchymal echogenicity. Portal vein is patent on color Doppler imaging with normal direction of blood flow towards the liver. IMPRESSION: The gallbladder is distended and there is a top-normal thickness of the gallbladder wall to 3 mm. Trace pericholecystic fluid is identified. No gallstones are identified however. Findings may represent an acalculous cholecystitis. HIDA scan may help for further correlation if findings are discordant from clinical exam/suspicion. Electronically Signed   By: Ashley Royalty M.D.   On: 06/30/2018 01:05   CT with findings of possible pericholecystic edema.  Ultrasound recommended.  Discussed with patient, willing to stay for ultrasound.  States she is feeling significantly better at this time.  [place] with distended gallbladder with thickness up to 3 mm.  Trace pericholecystic fluid.  Possible acalculous cholecystitis.  Will discuss with general surgery for recommendations.  Patient continues feeling well here.  Discussed with Dr. Lamont Snowball-- recommends admission for IV abx, he will consult. Discussed with Dr. Hal Hope-- he will admit for ongoing care.  Dr. Donne Hazel has evaluated patient in ED-- actually feels she is stable for discharge home.  See his consult note. He  will message GI physician, Dr. Fuller Plan, to get her some follow-up as she may benefit for EUS.  Patient comfortable with this and is eager to go home.  Given run of IV zosyn here, will d/c home with augmentin.  Discussed diet modification, avoiding tylenol and alcohol.  She was given strict return precautions for any new/acute changes including uncontrolled pain, nausea, vomiting, high fever, etc.   Larene Pickett, PA-C 06/30/18 [id number]    Ripley Fraise, MD 06/30/18 [id number]

## 2018-06-30 NOTE — ED Notes (Signed)
[place] MD at bedside to assess

## 2018-06-30 NOTE — Discharge Instructions (Signed)
Take the prescribed medication as directed.   Recommend low fat/grease diet for now.  Try to avoid alcohol and tylenol as well. Follow-up with Dr. Fuller Plan-- they should be contacting your for appt but if you do not hear from them in the next 24 hours call their office. Return to the ED for new or worsening symptoms.

## 2018-06-30 NOTE — ED Notes (Signed)
Returned from CT scan.

## 2018-06-30 NOTE — ED Notes (Signed)
Returned from ultrasound.

## 2018-06-30 NOTE — ED Notes (Signed)
Patient verbalizes understanding of discharge instructions. Opportunity for questioning and answers were provided. Armband removed by staff, pt discharged from ED.  

## 2018-06-30 NOTE — Telephone Encounter (Signed)
Patient was evaluated in the ED.  Found to have a distended gallbladder and originally they recommended admission.  Patient was evaluated by Dr. Donne Hazel that recommended discharge and outpatient follow up with Dr. Fuller Plan.  Possibly consider EUS.  Patient will come in tomorrow and discuss with Alonza Bogus, PA

## 2018-07-01 ENCOUNTER — Ambulatory Visit (INDEPENDENT_AMBULATORY_CARE_PROVIDER_SITE_OTHER): Payer: BLUE CROSS/BLUE SHIELD | Admitting: Gastroenterology

## 2018-07-01 ENCOUNTER — Encounter: Payer: Self-pay | Admitting: Gastroenterology

## 2018-07-01 VITALS — BP 104/62 | HR 76 | Ht 66.5 in | Wt 145.4 lb

## 2018-07-01 DIAGNOSIS — R748 Abnormal levels of other serum enzymes: Secondary | ICD-10-CM

## 2018-07-01 DIAGNOSIS — R945 Abnormal results of liver function studies: Secondary | ICD-10-CM

## 2018-07-01 DIAGNOSIS — R1013 Epigastric pain: Secondary | ICD-10-CM | POA: Insufficient documentation

## 2018-07-01 DIAGNOSIS — R195 Other fecal abnormalities: Secondary | ICD-10-CM | POA: Diagnosis not present

## 2018-07-01 DIAGNOSIS — K269 Duodenal ulcer, unspecified as acute or chronic, without hemorrhage or perforation: Secondary | ICD-10-CM | POA: Diagnosis not present

## 2018-07-01 DIAGNOSIS — R7989 Other specified abnormal findings of blood chemistry: Secondary | ICD-10-CM

## 2018-07-01 NOTE — Progress Notes (Signed)
Thank you for sending this case to me. I have reviewed the entire note, and the outlined plan seems appropriate.  Thank you for reviewing the case with Dr. Ardis Hughs today re: question of EUS. I will do the EGD tomorrow and then discuss with her primary GI  - Dr. Fuller Plan (who was out of the office today). Despite how quickly her pain resolved, this may yet have been mild biliary pancreatitis or biliary colic.  Wilfrid Lund, MD

## 2018-07-01 NOTE — Progress Notes (Signed)
07/01/2018 Rhonda Gardner [id number] 04-Jul-1956   HISTORY OF PRESENT ILLNESS:  This is a 62 year old female who is a patient of Dr. Lynne Leader.  She is here for ER follow-up.  She presented to the ER yesterday with sudden onset epigastric abdominal pain.  Lipase slightly elevated at 199 and LFTs slightly elevated with AST 190 and ALT 67 with normal alk phos and total bili.  Pain resolved quickly and she has no residual pain today.  Pancreas looks normal on CT scan.  CT scan and ultrasound both suggest possible gallbladder issues (results below).  She was referred here to discuss possible EUS.  While she was here she also mentioned that she started having black stools this morning, pure black in color.  Has history of duodenal ulcer on EGD 07/2017 at which time she was found to have one oozing DU with bleeding vessel to which epi and bicap were applied.  Denies NSAID use.  CT scan abdomen and pelvis with contrast showed the following:  IMPRESSION: 1. No evidence of bowel obstruction or inflammation. Appendix is normal. 2. Gallbladder wall is thickened and there is mild pericholecystic edema. No stones are identified but this could indicate cholecystitis in the appropriate clinical setting. Consider ultrasound for further evaluation if clinically indicated. 3. Prominent parapelvic cysts in the left kidney. No hydronephrosis or hydroureter. 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  Ultrasound showed the following:  IMPRESSION: The gallbladder is distended and there is a top-normal thickness of the gallbladder wall to 3 mm. Trace pericholecystic fluid is identified. No gallstones are identified however. Findings may represent an acalculous cholecystitis. HIDA scan may help for further correlation if findings are discordant from clinical exam/suspicion.    Past Medical History:  Diagnosis Date  . Allergy 2006   chemical cauterization in spring 2006, and 2nd treatment with good  results.(Dr.Shoemaker)  . Bipolar disorder ([place])   . Bleeding ulcer   . Breast cancer ([place]) 12/2014   invasive ductal carcinoma (LEFT)  . Breast cancer of upper-outer quadrant of left female breast ([place]) 12/22/2014  . Breast hematoma 12/19/2015   had left breast aspiration of 4cm hematoma-path indicates benign hematoma  . C. difficile colitis   . C. difficile diarrhea   . Osteoporosis 06/20/2016   T-2.5'@spine'  on Dexa ([place])  . Plantar fasciitis   . PONV (postoperative nausea and vomiting)   . Symptomatic anemia 07/16/2017   Past Surgical History:  Procedure Laterality Date  . BREAST BIOPSY Bilateral    benign and a right stereotatic breast biopsy.  . ESOPHAGOGASTRODUODENOSCOPY N/A 07/16/2017   Procedure: ESOPHAGOGASTRODUODENOSCOPY (EGD);  Surgeon: Ladene Artist, MD;  Location[facility];  Service: Endoscopy;  Laterality: N/A;  . fibroid excision  age 70   prolapsed fibroid removed  . FOOT SURGERY Left 2014   Dr. Paulla Dolly  . FOOT SURGERY Right 08/2016   Dr. Paulla Dolly --Shortening Osteotomy with Fixation 1st Metatarsal   . MOUTH SURGERY  06/2017   CYST   . PORT-A-CATH REMOVAL N/A 02/15/2016   Procedure: REMOVAL PORT-A-CATH;  Surgeon: Autumn Messing III, MD;  Location: [facility];  Service: General;  Laterality: N/A;  . RADIOACTIVE SEED GUIDED PARTIAL MASTECTOMY WITH AXILLARY SENTINEL LYMPH NODE BIOPSY Left 05/25/2015   Procedure: LEFT BREAST LUMPECTOMY WITH RADIOACTIVE SEED AND SENTINEL LYMPH NODE BIOPSY;  Surgeon: Autumn Messing III, MD;  Location: [facility];  Service: General;  Laterality: Left;    reports that she has never smoked. She has never  used smokeless tobacco. She reports that she drinks alcohol. She reports that she does not use drugs. family history includes Arthritis in her mother; Autism in her sister; Blindness in her sister; Cancer (age of onset: 74) in her paternal aunt; Colon cancer (age of onset: 38) in her mother; Diabetes in her paternal aunt;  Goiter in her paternal aunt; Heart disease in her father; Hypertension in her mother; Lung cancer in her maternal aunt; Macular degeneration in her mother; Mental retardation in her sister; Osteoporosis in her sister and sister; Skin cancer in her father. Allergies  Allergen Reactions  . Clindamycin/Lincomycin Diarrhea    Resulted in C-diff  . Nsaids Other (See Comments)    History of bleeding ulcers   . Other Nausea Only    Stronger pain meds  . Adhesive [Tape] Rash    Pt is sensitive to any kind of adhesive tape products.      Outpatient Encounter Medications as of 07/01/2018  Medication Sig  . amoxicillin-clavulanate (AUGMENTIN) 875-125 MG tablet Take 1 tablet by mouth every 12 (twelve) hours.  . Cholecalciferol (VITAMIN D) 2000 UNITS tablet Take 2,000 Units by mouth daily.  . fluticasone (FLONASE) 50 MCG/ACT nasal spray Place 1 spray into both nostrils daily.   Marland Kitchen LAMICTAL 200 MG tablet Take 0.5 tablets (100 mg total) by mouth daily.  . LUTEIN-ZEAXANTHIN PO Take 1 tablet by mouth daily.  . magnesium 30 MG tablet Take 30 mg by mouth daily.  Marland Kitchen omeprazole (PRILOSEC) 20 MG capsule Take 20 mg by mouth daily.  . ondansetron (ZOFRAN ODT) 4 MG disintegrating tablet Take 1 tablet (4 mg total) by mouth every 8 (eight) hours as needed for nausea.  . Probiotic Product (PROBIOTIC PO) Take 1 tablet by mouth 2 (two) times daily.  . promethazine (PHENERGAN) 25 MG tablet Take 1 tablet (25 mg total) by mouth every 6 (six) hours as needed for nausea or vomiting.  . Zoledronic Acid (ZOMETA IV) Inject into the vein every 6 (six) months.   No facility-administered encounter medications on file as of 07/01/2018.      REVIEW OF SYSTEMS  : All other systems reviewed and negative except where noted in the History of Present Illness.   PHYSICAL EXAM: BP 104/62 (BP Location: Right Arm, Patient Position: Sitting, Cuff Size: Normal)   Pulse 76   Ht 5' 6.5" (1.689 m) Comment: height measured without shoes   Wt 145 lb 6 oz (65.9 kg)   BMI 23.11 kg/m  General: Well developed white female in no acute distress Head: Normocephalic and atraumatic Eyes:  Sclerae anicteric, conjunctiva pink. Ears: Normal auditory acuity Lungs: Clear throughout to auscultation; no increased WOB. Heart: Regular rate and rhythm; no M/R/G. Abdomen: Soft, non-distended.  BS present.  Non-tender. Rectal: Musculoskeletal: Symmetrical with no gross deformities  Skin: No lesions on visible extremities Extremities: No edema  Neurological: Alert oriented x 4, grossly non-focal Psychological:  Alert and cooperative. Normal mood and affect  ASSESSMENT AND PLAN: *61 year old female who presented to the ER yesterday with sudden onset epigastric abdominal pain.  Lipase slightly elevated at 199 and LFTs slightly elevated with AST 190 and ALT 67 with normal alk phos and total bili.  Pain resolved quickly and she has no residual pain today.  Pancreas looks normal on CT scan.  CT scan and ultrasound both suggest possible gallbladder issues.  I think that this is likely all gallbladder in origin.  She was referred here to discuss possible EUS.  I discussed with Dr.  Ardis Hughs we do not see any indication for that at this time.  We will check a HIDA scan to rule out biliary dyskinesia.  He will follow-up with her in 2 months for repeat lipase and LFTs and to revisit the issue regarding need for EUS. *Black stools: This just started this morning.  She is in fact strongly Hemoccult positive.  Unsure if this is related to the issues that occurred yesterday.  She does have history of bleeding duodenal ulcer.  Certainly ulcer should not cause elevated liver enzymes, but could cause bump in lipase and epigastric abdominal pain.  I am having her increase her omeprazole to twice a day.  We will plan for EGD with Dr. Loletha Carrow tomorrow as he had first available to evaluate this issue.  **The risks, benefits, and alternatives to EGD were discussed with the  patient and she consents to proceed.   **40 minutes were spent with the patient in which at least 50% was spent in discussing of findings, diagnoses, and treatment options.  CC:  Rita Ohara, MD

## 2018-07-01 NOTE — Patient Instructions (Signed)
You have been scheduled for an endoscopy. Please follow written instructions given to you at your visit today. If you use inhalers (even only as needed), please bring them with you on the day of your procedure.  Take the Omeprazole by mouth twice daily.  You have been scheduled for a HIDA scan at [facility] (1st floor) on Monday 07-06-2018 at 11:00 am.  Please arrive at 10:45 am to your scheduled appointment .  Make certain not to have anything to eat or drink at least 6 hours prior to your test. Should this appointment date or time not work well for you, please call radiology scheduling at [phone].  _____________________________________________________________________ hepatobiliary (HIDA) scan is an imaging procedure used to diagnose problems in the liver, gallbladder and bile ducts. In the HIDA scan, a radioactive chemical or tracer is injected into a vein in your arm. The tracer is handled by the liver like bile. Bile is a fluid produced and excreted by your liver that helps your digestive system break down fats in the foods you eat. Bile is stored in your gallbladder and the gallbladder releases the bile when you eat a meal. A special nuclear medicine scanner (gamma camera) tracks the flow of the tracer from your liver into your gallbladder and small intestine.  During your HIDA scan  You'll be asked to change into a hospital gown before your HIDA scan begins. Your health care team will position you on a table, usually on your back. The radioactive tracer is then injected into a vein in your arm.The tracer travels through your bloodstream to your liver, where it's taken up by the bile-producing cells. The radioactive tracer travels with the bile from your liver into your gallbladder and through your bile ducts to your small intestine.You may feel some pressure while the radioactive tracer is injected into your vein. As you lie on the table, a special gamma camera is positioned over your  abdomen taking pictures of the tracer as it moves through your body. The gamma camera takes pictures continually for about an hour. You'll need to keep still during the HIDA scan. This can become uncomfortable, but you may find that you can lessen the discomfort by taking deep breaths and thinking about other things. Tell your health care team if you're uncomfortable. The radiologist will watch on a computer the progress of the radioactive tracer through your body. The HIDA scan may be stopped when the radioactive tracer is seen in the gallbladder and enters your small intestine. This typically takes about an hour. In some cases extra imaging will be performed if original images aren't satisfactory, if morphine is given to help visualize the gallbladder or if the medication CCK is given to look at the contraction of the gallbladder. This test typically takes 2 hours to complete. ________________________________________________________________________

## 2018-07-02 ENCOUNTER — Ambulatory Visit (AMBULATORY_SURGERY_CENTER): Payer: BLUE CROSS/BLUE SHIELD | Admitting: Gastroenterology

## 2018-07-02 ENCOUNTER — Encounter: Payer: Self-pay | Admitting: Gastroenterology

## 2018-07-02 ENCOUNTER — Other Ambulatory Visit: Payer: Self-pay

## 2018-07-02 ENCOUNTER — Encounter (HOSPITAL_COMMUNITY): Payer: Self-pay | Admitting: Emergency Medicine

## 2018-07-02 ENCOUNTER — Inpatient Hospital Stay (HOSPITAL_COMMUNITY)
Admission: EM | Admit: 2018-07-02 | Discharge: 2018-07-04 | DRG: 379 | Disposition: A | Discharge status: Home / Self Care | Payer: BLUE CROSS/BLUE SHIELD | Attending: Internal Medicine | Admitting: Internal Medicine

## 2018-07-02 VITALS — BP 104/57 | HR 69 | Temp 98.2°F | Resp 15 | Ht 66.5 in | Wt 145.0 lb

## 2018-07-02 DIAGNOSIS — R740 Nonspecific elevation of levels of transaminase and lactic acid dehydrogenase [LDH]: Secondary | ICD-10-CM | POA: Diagnosis not present

## 2018-07-02 DIAGNOSIS — Z9221 Personal history of antineoplastic chemotherapy: Secondary | ICD-10-CM | POA: Diagnosis not present

## 2018-07-02 DIAGNOSIS — K828 Other specified diseases of gallbladder: Secondary | ICD-10-CM | POA: Diagnosis present

## 2018-07-02 DIAGNOSIS — F3170 Bipolar disorder, currently in remission, most recent episode unspecified: Secondary | ICD-10-CM | POA: Diagnosis not present

## 2018-07-02 DIAGNOSIS — Z79899 Other long term (current) drug therapy: Secondary | ICD-10-CM

## 2018-07-02 DIAGNOSIS — K449 Diaphragmatic hernia without obstruction or gangrene: Secondary | ICD-10-CM

## 2018-07-02 DIAGNOSIS — D696 Thrombocytopenia, unspecified: Secondary | ICD-10-CM | POA: Diagnosis not present

## 2018-07-02 DIAGNOSIS — Z888 Allergy status to other drugs, medicaments and biological substances status: Secondary | ICD-10-CM | POA: Diagnosis not present

## 2018-07-02 DIAGNOSIS — F319 Bipolar disorder, unspecified: Secondary | ICD-10-CM | POA: Diagnosis present

## 2018-07-02 DIAGNOSIS — D500 Iron deficiency anemia secondary to blood loss (chronic): Secondary | ICD-10-CM | POA: Diagnosis present

## 2018-07-02 DIAGNOSIS — Z91018 Allergy to other foods: Secondary | ICD-10-CM

## 2018-07-02 DIAGNOSIS — K264 Chronic or unspecified duodenal ulcer with hemorrhage: Secondary | ICD-10-CM | POA: Diagnosis present

## 2018-07-02 DIAGNOSIS — R195 Other fecal abnormalities: Secondary | ICD-10-CM

## 2018-07-02 DIAGNOSIS — M810 Age-related osteoporosis without current pathological fracture: Secondary | ICD-10-CM | POA: Diagnosis present

## 2018-07-02 DIAGNOSIS — Z853 Personal history of malignant neoplasm of breast: Secondary | ICD-10-CM | POA: Diagnosis not present

## 2018-07-02 DIAGNOSIS — K922 Gastrointestinal hemorrhage, unspecified: Secondary | ICD-10-CM | POA: Diagnosis present

## 2018-07-02 DIAGNOSIS — Z9012 Acquired absence of left breast and nipple: Secondary | ICD-10-CM

## 2018-07-02 DIAGNOSIS — R1013 Epigastric pain: Secondary | ICD-10-CM

## 2018-07-02 LAB — COMPREHENSIVE METABOLIC PANEL
ALBUMIN: 3.5 g/dL (ref 3.5–5.0)
ALT: 68 U/L — ABNORMAL HIGH (ref 0–44)
ANION GAP: 9 (ref 5–15)
AST: 33 U/L (ref 15–41)
Alkaline Phosphatase: 60 U/L (ref 38–126)
BILIRUBIN TOTAL: 0.3 mg/dL (ref 0.3–1.2)
BUN: 12 mg/dL (ref 8–23)
CALCIUM: 8.4 mg/dL — AB (ref 8.9–10.3)
CO2: 25 mmol/L (ref 22–32)
Chloride: 109 mmol/L (ref 98–111)
Creatinine, Ser: 0.71 mg/dL (ref 0.44–1.00)
GFR calc Af Amer: >60 mL/min (ref >60)
GLUCOSE: 95 mg/dL (ref 70–99)
POTASSIUM: 3.7 mmol/L (ref 3.5–5.1)
Sodium: 143 mmol/L (ref 135–145)
TOTAL PROTEIN: 5.7 g/dL — AB (ref 6.5–8.1)

## 2018-07-02 LAB — CBC WITH DIFFERENTIAL/PLATELET
BASOS ABS: 0 10*3/uL (ref 0.0–0.1)
BASOS PCT: 1 %
EOS ABS: 0.1 10*3/uL (ref 0.0–0.7)
EOS PCT: 3 %
HCT: 27.6 % — ABNORMAL LOW (ref 36.0–46.0)
Hemoglobin: 9.1 g/dL — ABNORMAL LOW (ref 12.0–15.0)
Lymphocytes Relative: 28 %
Lymphs Abs: 1 10*3/uL (ref 0.7–4.0)
MCH: 30.4 pg (ref 26.0–34.0)
MCHC: 33 g/dL (ref 30.0–36.0)
MCV: 92.3 fL (ref 78.0–100.0)
MONO ABS: 0.3 10*3/uL (ref 0.1–1.0)
Monocytes Relative: 8 %
NEUTROS ABS: 2.1 10*3/uL (ref 1.7–7.7)
Neutrophils Relative %: 60 %
Platelets: 219 10*3/uL (ref 150–400)
RBC: 2.99 MIL/uL — ABNORMAL LOW (ref 3.87–5.11)
RDW: 18.2 % — AB (ref 11.5–15.5)
WBC: 3.6 10*3/uL — ABNORMAL LOW (ref 4.0–10.5)

## 2018-07-02 LAB — TYPE AND SCREEN
ABO/RH(D): O POS
ANTIBODY SCREEN: NEGATIVE

## 2018-07-02 LAB — LIPASE, BLOOD: LIPASE: 24 U/L (ref 11–51)

## 2018-07-02 LAB — HEMOGLOBIN: Hemoglobin: 9.8 g/dL — ABNORMAL LOW (ref 12.0–15.0)

## 2018-07-02 MED ORDER — SODIUM CHLORIDE 0.9 % IV SOLN
INTRAVENOUS | Status: DC
  Administered 2018-07-02 – 2018-07-03 (×2): via INTRAVENOUS

## 2018-07-02 MED ORDER — INFLUENZA VAC SPLIT QUAD 0.5 ML IM SUSY
0.5000 mL | PREFILLED_SYRINGE | INTRAMUSCULAR | Status: DC
  Filled 2018-07-02: qty 0.5

## 2018-07-02 MED ORDER — ZOLPIDEM TARTRATE 5 MG PO TABS: 5.0000 mg | ORAL_TABLET | Freq: Every evening | ORAL | Status: DC | PRN

## 2018-07-02 MED ORDER — ONDANSETRON HCL 4 MG PO TABS: 4.0000 mg | ORAL_TABLET | Freq: Four times a day (QID) | ORAL | Status: DC | PRN

## 2018-07-02 MED ORDER — SODIUM CHLORIDE 0.9 % IV SOLN
80.0000 mg | Freq: Once | INTRAVENOUS | Status: AC
  Administered 2018-07-02: 80 mg via INTRAVENOUS
  Filled 2018-07-02: qty 80

## 2018-07-02 MED ORDER — ONDANSETRON HCL 4 MG/2ML IJ SOLN: 4.0000 mg | Freq: Four times a day (QID) | INTRAMUSCULAR | Status: DC | PRN

## 2018-07-02 MED ORDER — PANTOPRAZOLE SODIUM 40 MG IV SOLR: 40.0000 mg | Freq: Two times a day (BID) | INTRAVENOUS | Status: DC

## 2018-07-02 MED ORDER — SODIUM CHLORIDE 0.9 % IV SOLN: 500.0000 mL | Freq: Once | INTRAVENOUS | Status: DC

## 2018-07-02 MED ORDER — SODIUM CHLORIDE 0.9 % IV SOLN
8.0000 mg/h | INTRAVENOUS | Status: DC
  Administered 2018-07-02 – 2018-07-03 (×2): 8 mg/h via INTRAVENOUS
  Filled 2018-07-02 (×3): qty 80

## 2018-07-02 NOTE — ED Triage Notes (Signed)
Pt sent from [place] for bleeding ulcer that was too extensive to get to in the office.

## 2018-07-02 NOTE — Progress Notes (Signed)
I agree with the above note, plan 

## 2018-07-02 NOTE — ED Provider Notes (Addendum)
[facility] DEPT Provider Note   CSN: [id number] Arrival date & time: 07/02/18  1652     History   Chief Complaint Chief Complaint  Patient presents with  . sent from [facility]  . bleeding ulcer    HPI Rhonda Gardner is a 62 y.o. female.  HPI Patient with history of duodenal ulcer was referred from lower gastroenterology's office for admission for gastrointestinal bleeding.  Was seen in the emergency department 2 days ago for sudden onset upper abdominal pain.  She also has developed melanotic stool which is Hemoccult positive positive and gastroenterology office.  Patient currently is denying any abdominal pain.  Denies lightheadedness or dizziness.  States she does have some fatigue. Past Medical History:  Diagnosis Date  . Allergy 2006   chemical cauterization in spring 2006, and 2nd treatment with good results.(Dr.Shoemaker)  . Bipolar disorder ([place])   . Bleeding ulcer   . Blood transfusion without reported diagnosis 07/2017   Bleeding ulcer  . Breast cancer ([place]) 12/2014   invasive ductal carcinoma (LEFT)  . Breast cancer of upper-outer quadrant of left female breast ([place]) 12/22/2014  . Breast hematoma 12/19/2015   had left breast aspiration of 4cm hematoma-path indicates benign hematoma  . C. difficile colitis   . C. difficile diarrhea   . Depression   . Osteoporosis 06/20/2016   T-2.5@spine  on Dexa ([facility])  . Plantar fasciitis   . PONV (postoperative nausea and vomiting)   . Symptomatic anemia 07/16/2017    Patient Active Problem List   Diagnosis Date Noted  . Abdominal pain, epigastric 07/01/2018  . Elevated lipase 07/01/2018  . Elevated LFTs 07/01/2018  . Iron deficiency anemia 08/17/2017  . C. difficile colitis 08/17/2017  . Dehydration 08/17/2017  . Hypokalemia 08/17/2017  . Symptomatic anemia 07/16/2017  . Bipolar 1 disorder ([place]) 07/16/2017  . Tachycardia 07/16/2017  . Hypotension due to hypovolemia 07/16/2017    . Melena   . Heme positive stool   . Duodenal ulcer   . Blood transfusion without reported diagnosis 07/14/2017  . Osteoporosis 07/29/2016  . Genetic counseling and testing 01/29/2016  . Pain in joint, pelvic region and thigh 06/12/2015  . 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 Facial flushing 01/25/2015  . Insomnia 01/25/2015  . Allergy to adhesive tape 01/25/2015  . Malignant neoplasm of upper-outer quadrant of left breast in female, estrogen receptor negative ([place]) 12/22/2014    Past Surgical History:  Procedure Laterality Date  . BREAST BIOPSY Bilateral    benign and a right stereotatic breast biopsy.  . COLONOSCOPY    . ESOPHAGOGASTRODUODENOSCOPY N/A 07/16/2017   Procedure: ESOPHAGOGASTRODUODENOSCOPY (EGD);  Surgeon: Ladene Artist, MD;  Location: [facility] ENDOSCOPY;  Service: Endoscopy;  Laterality: N/A;  . fibroid excision  age 18   prolapsed fibroid removed  . FOOT SURGERY Left 2014   Dr. Paulla Dolly  . FOOT SURGERY Right 08/2016   Dr. Paulla Dolly --Shortening Osteotomy with Fixation 1st Metatarsal   . MOUTH SURGERY  06/2017   [facility]   . PORT-A-CATH REMOVAL N/A 02/15/2016   Procedure: REMOVAL PORT-A-CATH;  Surgeon: Autumn Messing III, MD;  Location: [facility];  Service: General;  Laterality: N/A;  . RADIOACTIVE SEED GUIDED PARTIAL MASTECTOMY WITH AXILLARY SENTINEL LYMPH NODE BIOPSY Left 05/25/2015   Procedure: LEFT BREAST LUMPECTOMY WITH RADIOACTIVE SEED AND SENTINEL LYMPH NODE BIOPSY;  Surgeon: Autumn Messing III, MD;  Location: [facility];  Service: General;  Laterality: Left;  . UPPER GASTROINTESTINAL ENDOSCOPY  OB History    Gravida  1   Para      Term      Preterm      AB  1   Living        SAB      TAB  1   Ectopic      Multiple      Live Births               Home Medications    Prior to Admission medications   Medication Sig Start Date End Date Taking? Authorizing Provider  Cholecalciferol (VITAMIN D) 2000 UNITS  tablet Take 2,000 Units by mouth daily.   Yes [provider]  LAMICTAL 200 MG tablet Take 0.5 tablets (100 mg total) by mouth daily. 02/24/17  Yes Rita Ohara, MD  LUTEIN-ZEAXANTHIN PO Take 1 tablet by mouth daily.   Yes [provider]  magnesium 30 MG tablet Take 30 mg by mouth daily.   Yes [provider]  omeprazole (PRILOSEC) 20 MG capsule Take 20 mg by mouth daily.   Yes [provider]  Probiotic Product (PROBIOTIC PO) Take 1 tablet by mouth 2 (two) times daily.   Yes [provider]  amoxicillin-clavulanate (AUGMENTIN) 875-125 MG tablet Take 1 tablet by mouth every 12 (twelve) hours. Patient not taking: Reported on 07/02/2018 06/30/18   Larene Pickett, PA-C  fluticasone [facility]) 50 MCG/ACT nasal spray Place 1 spray into both nostrils daily as needed for allergies.     [provider]  ondansetron (ZOFRAN ODT) 4 MG disintegrating tablet Take 1 tablet (4 mg total) by mouth every 8 (eight) hours as needed for nausea. 06/30/18   Larene Pickett, PA-C  promethazine (PHENERGAN) 25 MG tablet Take 1 tablet (25 mg total) by mouth every 6 (six) hours as needed for nausea or vomiting. 01/07/18   Rita Ohara, MD  Zoledronic Acid (ZOMETA IV) Inject into the vein every 6 (six) months.    [provider]    Family History Family History  Problem Relation Age of Onset  . Heart disease Father   . Skin cancer Father        non melanoma - farmer  . Hypertension Mother   . Arthritis Mother        osteoarthritis  . Macular degeneration Mother        wet and dry  . Colon cancer Mother 61       colon cancer at age 42  . Osteoporosis Sister        osteopenia (states it resolved)  . Lung cancer Maternal Aunt        lung cancer  . Cancer Paternal Aunt 75       ? stomach cancer  . Autism Sister   . Mental retardation Sister   . Blindness Sister        secondary to retrolental fibroplasia  . Osteoporosis Sister   . Goiter Paternal Aunt   .  Diabetes Paternal Aunt   . Esophageal cancer Neg Hx   . Rectal cancer Neg Hx   . Stomach cancer Neg Hx     Social History Social History   Tobacco Use  . Smoking status: Never Smoker  . Smokeless tobacco: Never Used  Substance Use Topics  . Alcohol use: Yes    Alcohol/week: 0.0 standard drinks    Comment: 1-2 glasses of wine a month  . Drug use: No     Allergies   Clindamycin/lincomycin; Nsaids;  Other; and Adhesive [tape]   Review of Systems Review of Systems  Constitutional: Positive for fatigue. Negative for chills and fever.  Respiratory: Negative for cough and shortness of breath.   Cardiovascular: Negative for chest pain.  Gastrointestinal: Positive for blood in stool. Negative for abdominal pain, constipation, diarrhea, nausea and vomiting.  Genitourinary: Negative for dysuria, flank pain and frequency.  Musculoskeletal: Negative for back pain, myalgias and neck pain.  Skin: Negative for rash and wound.  Neurological: Negative for weakness, light-headedness, numbness and headaches.  All other systems reviewed and are negative.    Physical Exam Updated Vital Signs BP 99/61   Pulse 63   Temp (!) 97.5 F (36.4 C) (Oral)   Resp 14   SpO2 100%   Physical Exam  Constitutional: She is oriented to person, place, and time. She appears well-developed and well-nourished. No distress.  HENT:  Head: Normocephalic and atraumatic.  Mouth/Throat: Oropharynx is clear and moist. No oropharyngeal exudate.  Eyes: Pupils are equal, round, and reactive to light. EOM are normal.  Neck: Normal range of motion. Neck supple. No JVD present.  Cardiovascular: Normal rate and regular rhythm. Exam reveals no gallop and no friction rub.  No murmur heard. Pulmonary/Chest: Effort normal and breath sounds normal. No stridor. No respiratory distress. She has no wheezes. She has no rales. She exhibits no tenderness.  Abdominal: Soft. Bowel sounds are normal. There is no tenderness. There  is no rebound and no guarding.  Musculoskeletal: Normal range of motion. She exhibits no edema or tenderness.  Lymphadenopathy:    She has no cervical adenopathy.  Neurological: She is alert and oriented to person, place, and time.  Moving all extremities without focal deficit.  Sensation intact.  Skin: Skin is warm and dry. Capillary refill takes less than 2 seconds. No rash noted. She is not diaphoretic. No erythema.  Psychiatric: She has a normal mood and affect. Her behavior is normal.  Nursing note and vitals reviewed.    ED Treatments / Results  Labs (all labs ordered are listed, but only abnormal results are displayed) Labs Reviewed  CBC WITH DIFFERENTIAL/PLATELET - Abnormal; Notable for the following components:      Result Value   WBC 3.6 (*)    RBC 2.99 (*)    Hemoglobin 9.1 (*)    HCT 27.6 (*)    RDW 18.2 (*)    All other components within normal limits  COMPREHENSIVE METABOLIC PANEL - Abnormal; Notable for the following components:   Calcium 8.4 (*)    Total Protein 5.7 (*)    ALT 68 (*)    All other components within normal limits  LIPASE, BLOOD  TYPE AND SCREEN    EKG None  Radiology No results found.  Procedures Procedures (including critical care time)  Medications Ordered in ED Medications  pantoprazole (PROTONIX) 80 mg in sodium chloride 0.9 % 250 mL (0.32 mg/mL) infusion (8 mg/hr Intravenous New Bag/Given 07/02/18 1900)  pantoprazole (PROTONIX) injection 40 mg (has no administration in time range)  pantoprazole (PROTONIX) 80 mg in sodium chloride 0.9 % 100 mL IVPB (0 mg Intravenous Stopped 07/02/18 1858)     Initial Impression / Assessment and Plan / ED Course  I have reviewed the triage vital signs and the nursing notes.  Pertinent labs & imaging results that were available during my care of the patient were reviewed by me and considered in my medical decision making (see chart for details).    Discussed with hospitalist who will  see patient  in the emergency department and admit.  Dr. Carlean Purl to consult as an inpatient.  IV Protonix has been initiated.  Spoke with Dr. Rush Landmark.  He is aware the patient is here and being admitted.  Aware of the drop in hemoglobin though patient is hemodynamically stable.  Plan is for Dr. Carlean Purl to see patient in the morning and plan for endoscopy tomorrow.  Advised to continue IV Protonix and keep patient n.p.o. Final Clinical Impressions(s) / ED Diagnoses   Final diagnoses:  Upper gastrointestinal bleed    ED Discharge Orders    None       Julianne Rice, MD 07/02/18 1931    Julianne Rice, MD 07/02/18 2101

## 2018-07-02 NOTE — ED Notes (Signed)
Called to give report. Nurse states that the pt switched rooms. Tried calling nurse's phone and line was busy.

## 2018-07-02 NOTE — Op Note (Signed)
[facility] Patient Name: Rhonda Gardner Procedure Date: 07/02/2018 1:30 PM MRN: [id number] Endoscopist: Mallie Mussel L. Loletha Carrow , MD Age: 62 Referring MD:  Date of Birth: 02-Mar-1956 Gender: Female Account #: [id number] Procedure:                Upper GI endoscopy Indications:              Epigastric abdominal pain (ED visit 9/16 for                            pain/elevated LFTs, anemia - imagning suggesting GB                            wall thickening and eleavted lipase 199), Acute                            post hemorrhagic anemia, Heme positive stool Medicines:                Monitored Anesthesia Care Procedure:                Pre-Anesthesia Assessment:                           - Prior to the procedure, a History and Physical                            was performed, and patient medications and                            allergies were reviewed. [name] tolerance of                            previous anesthesia was also reviewed. The risks                            and benefits of the procedure and the sedation                            options and risks were discussed with the patient.                            All questions were answered, and informed consent                            was obtained. Prior Anticoagulants: The patient has                            taken no previous anticoagulant or antiplatelet                            agents. ASA Grade Assessment: II - A patient with                            mild systemic disease. After reviewing the risks  and benefits, the patient was deemed in                            satisfactory condition to undergo the procedure.                           After obtaining informed consent, the endoscope was                            passed under direct vision. Throughout the                            procedure, the patient's blood pressure, pulse, and                            oxygen  saturations were monitored continuously. The                            Endoscope was introduced through the mouth, and                            advanced to the second part of duodenum. The upper                            GI endoscopy was accomplished without difficulty.                            The patient tolerated the procedure well. Scope In: Scope Out: Findings:                 A small hiatal hernia was present.                           The stomach was normal.                           The cardia and gastric fundus were normal on                            retroflexion.                           One large oozing cratered duodenal ulcer (involving                            at least 2/3 circumference of the duodenal wall in                            the peri-ampullary region) was found in the second                            portion of the duodenum. The tissue was friable,                            and blood was slowly oozing  from the middle of it -                            visualization difficult due to ulcer size and                            location. Complications:            No immediate complications. Estimated Blood Loss:     Estimated blood loss was minimal. Impression:               - Small hiatal hernia.                           - Normal stomach.                           - One oozing duodenal ulcer.                           - No specimens collected.                           Further imaging of gallbladder will be necessary,                            possible EUS - see CT abdomen images from 06/29/18. Recommendation:           - Admit the patient to hospital ward for ongoing                            care. Needs serial CBC, repeat EGD with end-viewing                            and side-viewing scopes today or tomorrow for                            bleeding control and biopsies of ulcer edge.                           - NPO. Judea Riches L. Loletha Carrow, MD 07/02/2018  2:13:48 PM This report has been signed electronically.

## 2018-07-02 NOTE — ED Notes (Signed)
Bed: WA02 Expected date:  Expected time:  Means of arrival:  Comments: TRIAGE

## 2018-07-02 NOTE — Progress Notes (Signed)
Report given to PACU, vss 

## 2018-07-02 NOTE — H&P (Signed)
[facility]                                                                                    Patient Demographics  Rhonda Gardner, is a 62 y.o. female  CSN: [id number]  MRN: [id number]  DOB - Mar 26, 1956  Admit Date - 07/02/2018  Outpatient Primary MD for the patient is Rita Ohara, MD   With History of -  Past Medical History:  Diagnosis Date  . Allergy 2006   chemical cauterization in spring 2006, and 2nd treatment with good results.(Dr.Shoemaker)  . Bipolar disorder ([place])   . Bleeding ulcer   . Blood transfusion without reported diagnosis 07/2017   Bleeding ulcer  . Breast cancer ([place]) 12/2014   invasive ductal carcinoma (LEFT)  . Breast cancer of upper-outer quadrant of left female breast ([place]) 12/22/2014  . Breast hematoma 12/19/2015   had left breast aspiration of 4cm hematoma-path indicates benign hematoma  . C. difficile colitis   . C. difficile diarrhea   . Depression   . Osteoporosis 06/20/2016   T-2.5@spine  on Dexa ([facility])  . Plantar fasciitis   . PONV (postoperative nausea and vomiting)   . Symptomatic anemia 07/16/2017      Past Surgical History:  Procedure Laterality Date  . BREAST BIOPSY Bilateral    benign and a right stereotatic breast biopsy.  . COLONOSCOPY    . ESOPHAGOGASTRODUODENOSCOPY N/A 07/16/2017   Procedure: ESOPHAGOGASTRODUODENOSCOPY (EGD);  Surgeon: Ladene Artist, MD;  Location: [facility] ENDOSCOPY;  Service: Endoscopy;  Laterality: N/A;  . fibroid excision  age 30   prolapsed fibroid removed  . FOOT SURGERY Left 2014   Dr. Paulla Dolly  . FOOT SURGERY Right 08/2016   Dr. Paulla Dolly --Shortening Osteotomy with Fixation 1st Metatarsal   . MOUTH SURGERY  06/2017   [facility]   . PORT-A-CATH REMOVAL N/A 02/15/2016   Procedure: REMOVAL PORT-A-CATH;  Surgeon: Autumn Messing III, MD;  Location: [facility];  Service: General;  Laterality: N/A;  . RADIOACTIVE SEED GUIDED PARTIAL MASTECTOMY WITH AXILLARY SENTINEL LYMPH NODE BIOPSY Left  05/25/2015   Procedure: LEFT BREAST LUMPECTOMY WITH RADIOACTIVE SEED AND SENTINEL LYMPH NODE BIOPSY;  Surgeon: Autumn Messing III, MD;  Location: [place];  Service: General;  Laterality: Left;  . UPPER GASTROINTESTINAL ENDOSCOPY      in for   Chief Complaint  Patient presents with  . sent from [place]  . bleeding ulcer     HPI  Rhonda Gardner  is a 62 y.o. female, with past medical history significant for duodenal ulcer around 1 year ago status post EGD on 07/16/2017, breast cancer and bipolar disorder who is presenting today status post EGD which showed recurrence of her duodenal ulcer for possible cauterization by [facility] in a.m. patient was seen 2 days ago in the emergency room for upper abdominal pain and melena and had an EGD done today by Dr. Loletha Carrow .  She denies any history of shortness of breath, dizziness or abdominal pain today.  There was a drop in her hemoglobin from 10.3-9.1.  No nausea, vomiting or diarrhea.  No frank red blood stools noted.  No history of fever  or chills.    Review of Systems    In addition to the HPI above,  No Fever-chills, No Headache, No changes with Vision or hearing, No problems swallowing food or Liquids, No Chest pain, Cough or Shortness of Breath, No Abdominal pain, No Nausea or Vommitting, No Blood in Urine, No dysuria, No new skin rashes or bruises, No new joints pains-aches,  No new weakness, tingling, numbness in any extremity, No recent weight gain or loss, No polyuria, polydypsia or polyphagia, No significant Mental Stressors.  A full 10 point Review of Systems was done, except as stated above, all other Review of Systems were negative.   Social History Social History   Tobacco Use  . Smoking status: Never Smoker  . Smokeless tobacco: Never Used  Substance Use Topics  . Alcohol use: Yes    Alcohol/week: 0.0 standard drinks    Comment: 1-2 glasses of wine a month     Family History Family History   Problem Relation Age of Onset  . Heart disease Father   . Skin cancer Father        non melanoma - farmer  . Hypertension Mother   . Arthritis Mother        osteoarthritis  . Macular degeneration Mother        wet and dry  . Colon cancer Mother 71       colon cancer at age 32  . Osteoporosis Sister        osteopenia (states it resolved)  . Lung cancer Maternal Aunt        lung cancer  . Cancer Paternal Aunt 26       ? stomach cancer  . Autism Sister   . Mental retardation Sister   . Blindness Sister        secondary to retrolental fibroplasia  . Osteoporosis Sister   . Goiter Paternal Aunt   . Diabetes Paternal Aunt   . Esophageal cancer Neg Hx   . Rectal cancer Neg Hx   . Stomach cancer Neg Hx     Prior to Admission medications   Medication Sig Start Date End Date Taking? Authorizing Provider  Cholecalciferol (VITAMIN D) 2000 UNITS tablet Take 2,000 Units by mouth daily.   Yes [provider]  LAMICTAL 200 MG tablet Take 0.5 tablets (100 mg total) by mouth daily. 02/24/17  Yes Rita Ohara, MD  LUTEIN-ZEAXANTHIN PO Take 1 tablet by mouth daily.   Yes [provider]  magnesium 30 MG tablet Take 30 mg by mouth daily.   Yes [provider]  omeprazole (PRILOSEC) 20 MG capsule Take 20 mg by mouth daily.   Yes [provider]  Probiotic Product (PROBIOTIC PO) Take 1 tablet by mouth 2 (two) times daily.   Yes [provider]  amoxicillin-clavulanate (AUGMENTIN) 875-125 MG tablet Take 1 tablet by mouth every 12 (twelve) hours. Patient not taking: Reported on 07/02/2018 06/30/18   Larene Pickett, PA-C  fluticasone [facility]) 50 MCG/ACT nasal spray Place 1 spray into both nostrils daily as needed for allergies.     [provider]  ondansetron (ZOFRAN ODT) 4 MG disintegrating tablet Take 1 tablet (4 mg total) by mouth every 8 (eight) hours as needed for nausea. 06/30/18   Larene Pickett, PA-C  promethazine (PHENERGAN) 25 MG tablet  Take 1 tablet (25 mg total) by mouth every 6 (six) hours as needed for nausea or vomiting. 01/07/18   Rita Ohara, MD  Zoledronic Acid (ZOMETA IV)  Inject into the vein every 6 (six) months.    [provider]    Allergies  Allergen Reactions  . Clindamycin/Lincomycin Diarrhea    Resulted in C-diff  . Nsaids Other (See Comments)    History of bleeding ulcers   . Other Nausea Only    Stronger pain meds  . Adhesive [Tape] Rash    Pt is sensitive to any kind of adhesive tape products.    Physical Exam  Vitals  Blood pressure 111/64, pulse 64, temperature 98.4 F (36.9 C), temperature source Oral, resp. rate 18, SpO2 100 %.   1. General extremely pleasant lady, in no acute distress  2. Normal affect and insight, Not Suicidal or Homicidal, Awake Alert, Oriented X 3.  3. No F.N deficits, grossly, patient moving all extremities.  4. Ears and Eyes appear Normal, Conjunctivae clear, PERRLA. Moist Oral Mucosa.  5. Supple Neck, No JVD, No cervical lymphadenopathy appriciated, No Carotid Bruits.  6. Symmetrical Chest wall movement, Good air movement bilaterally, CTAB.  7. RRR, No Gallops, Rubs or Murmurs, No Parasternal Heave.  8. Positive Bowel Sounds, Abdomen Soft, Non tender, No organomegaly appriciated,No rebound -guarding or rigidity.  9.  No Cyanosis, Normal Skin Turgor, No Skin Rash or Bruise.  10. Good muscle tone,  joints appear normal , no effusions, Normal ROM.    Data Review  CBC Recent Labs  Lab 06/29/18 1808 07/02/18 1728  WBC 9.1 3.6*  HGB 10.3* 9.1*  HCT 33.0* 27.6*  PLT 205 219  MCV 93.8 92.3  MCH 29.3 30.4  MCHC 31.2 33.0  RDW 18.6* 18.2*  LYMPHSABS  --  1.0  MONOABS  --  0.3  EOSABS  --  0.1  BASOSABS  --  0.0   ------------------------------------------------------------------------------------------------------------------  Chemistries  Recent Labs  Lab 06/29/18 1808 07/02/18 1728  NA 139 143  K 3.6 3.7  CL 105 109  CO2 23 25   GLUCOSE 112* 95  BUN 27* 12  CREATININE 0.85 0.71  CALCIUM 8.8* 8.4*  AST 190* 33  ALT 67* 68*  ALKPHOS 70 60  BILITOT 0.5 0.3   ------------------------------------------------------------------------------------------------------------------ estimated creatinine clearance is 67.9 mL/min (by C-G formula based on SCr of 0.71 mg/dL). ------------------------------------------------------------------------------------------------------------------ No results for input(s): TSH, T4TOTAL, T3FREE, THYROIDAB in the last 72 hours.  Invalid input(s): FREET3   Coagulation profile No results for input(s): INR, PROTIME in the last 168 hours. ------------------------------------------------------------------------------------------------------------------- No results for input(s): DDIMER in the last 72 hours. -------------------------------------------------------------------------------------------------------------------  Cardiac Enzymes No results for input(s): CKMB, TROPONINI, MYOGLOBIN in the last 168 hours.  Invalid input(s): CK ------------------------------------------------------------------------------------------------------------------ Invalid input(s): POCBNP   ---------------------------------------------------------------------------------------------------------------  Urinalysis    Component Value Date/Time   COLORURINE YELLOW 06/29/2018 1933   APPEARANCEUR CLEAR 06/29/2018 1933   LABSPEC 1.015 06/29/2018 1933   LABSPEC 1.030 04/13/2018 0945   PHURINE 7.0 06/29/2018 1933   GLUCOSEU NEGATIVE 06/29/2018 1933   GLUCOSEU Negative 04/03/2015 1451   HGBUR NEGATIVE 06/29/2018 [place] NEGATIVE 06/29/2018 1933   BILIRUBINUR negative 04/13/2018 0945   BILIRUBINUR neg 04/10/2017 0858   BILIRUBINUR Negative 04/03/2015 1451   KETONESUR 5 (A) 06/29/2018 1933   PROTEINUR NEGATIVE 06/29/2018 1933   UROBILINOGEN negative (A) 04/10/2017 0858   UROBILINOGEN 0.2  04/03/2015 1451   NITRITE NEGATIVE 06/29/2018 1933   LEUKOCYTESUR NEGATIVE 06/29/2018 1933   LEUKOCYTESUR Small 04/03/2015 1451    ----------------------------------------------------------------------------------------------------------------   Imaging results:   Ct Abdomen Pelvis W Contrast  Result Date: 06/30/2018 CLINICAL DATA:  Sudden onset  severe epigastric abdominal pain in our ago. Nausea. EXAM: CT ABDOMEN AND PELVIS WITH CONTRAST TECHNIQUE: Multidetector CT imaging of the abdomen and pelvis was performed using the standard protocol following bolus administration of intravenous contrast. CONTRAST:  139mL OMNIPAQUE IOHEXOL 300 MG/ML  SOLN COMPARISON:  None. FINDINGS: Lower chest: The lung bases are clear. Hepatobiliary: Subcentimeter low-attenuation lesion in the dome of the liver is too small to characterize but probably a cyst or hemangioma. Gallbladder wall is thickened with mild pericholecystic edema. No stones are identified but this could indicate cholecystitis. No bile duct dilatation. Pancreas: Unremarkable. No pancreatic ductal dilatation or surrounding inflammatory changes. Spleen: Normal in size without focal abnormality. Adrenals/Urinary Tract: No adrenal gland nodules. Renal nephrograms are symmetrical and homogeneous. Prominent parapelvic cysts in the left kidney. No hydronephrosis or hydroureter. Bladder is unremarkable. Stomach/Bowel: Stomach, small bowel, and colon are not abnormally distended. Opaque medication in the duodenum and cecum. Scattered stool in the colon. No inflammatory infiltration. No discrete wall thickening appreciated. Appendix is normal. Vascular/Lymphatic: No significant vascular findings are present. No enlarged abdominal or pelvic lymph nodes. Reproductive: Uterus and bilateral adnexa are unremarkable. Other: No free air or free fluid in the abdomen. Abdominal wall musculature appears intact. Musculoskeletal: Tarlov cysts in the sacrum. No destructive bone  lesions. IMPRESSION: 1. No evidence of bowel obstruction or inflammation. Appendix is normal. 2. Gallbladder wall is thickened and there is mild pericholecystic edema. No stones are identified but this could indicate cholecystitis in the appropriate clinical setting. Consider ultrasound for further evaluation if clinically indicated. 3. Prominent parapelvic cysts in the left kidney. No hydronephrosis or hydroureter. 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 Electronically Signed   By: Lucienne Capers M.D.   On: 06/30/2018 00:03   US Abdomen Limited Ruq  Result Date: 06/30/2018 CLINICAL DATA:  Abnormal gallbladder seen on recent CT. EXAM: ULTRASOUND ABDOMEN LIMITED RIGHT UPPER QUADRANT COMPARISON:  CT study from 06/29/2018 FINDINGS: Gallbladder: Distended gallbladder with top normal gallbladder wall thickness of 3 mm. No gallstones are identified. Trace pericholecystic fluid. Common bile duct: Diameter: 2.9 mm proximally and normal. Liver: No focal lesion identified. Within normal limits in parenchymal echogenicity. Portal vein is patent on color Doppler imaging with normal direction of blood flow towards the liver. IMPRESSION: The gallbladder is distended and there is a top-normal thickness of the gallbladder wall to 3 mm. Trace pericholecystic fluid is identified. No gallstones are identified however. Findings may represent an acalculous cholecystitis. HIDA scan may help for further correlation if findings are discordant from clinical exam/suspicion. Electronically Signed   By: Ashley Royalty M.D.   On: 06/30/2018 01:05      Assessment & Plan  1.  Duodenal ulcer      ReCurrent , history of nonsteroidal induced duodenal ulcer on 07/2017 status post EGD      Protonix IV      Serial hemoglobins and transfuse if hemoglobin is less than 7.5      N.p.o. after midnight      IV fluids      Dr. Carlean Purl aware of consult      Discussed with GI on-call who advised to follow her hemoglobin hematocrit and inform in case of        hemodynamic instability  2.  Transient transaminitis with gallbladder abnormalities      Monitor LFTs  3.  History of bipolar disorder      Will hold Lamictal for today  4.  History of breast cancer of left breast  5.  History of thrombocytopenia    DVT Prophylaxis SCDs  AM Labs Ordered, also please review Full Orders  Family Communication: Admission, patients condition and plan of care including tests being ordered have been discussed with the patient and husband who indicate understanding and agree with the plan and Code Status.  Code Status full  Disposition Plan: Home  Time spent in minutes : 42 minutes  Condition GUARDED   @SIGNATURE @

## 2018-07-02 NOTE — Progress Notes (Signed)
Patient comfortable in recovery - no pain , vomiting.  Vital signs normal.  I spke with Dr. Wynelle Cleveland of [facility], who felt more comfortable if patient were admitted through ED rather than direct admit.  I informed patient, and her husband will bring her over to [facility] ED.  Then spoke to Dr. Gilford Raid in [facility] ED and gave info to hopefully expedite management and admission.  I will inform Dr. Carlean Purl (hospital doc ) soon so he can see her in consult and plan EGD.

## 2018-07-02 NOTE — ED Notes (Signed)
ED TO INPATIENT HANDOFF REPORT  Name/Age/Gender Rhonda Gardner 62 y.o. female  Code Status Code Status History    Date Active Date Inactive Code Status Order ID Comments User Context   08/17/2017 2318 08/20/2017 2046 Full Code [id number]  Ivor Costa, MD ED   07/16/2017 0843 07/19/2017 1413 Full Code [id number]  Waldemar Dickens, MD ED   01/13/2015 1016 01/14/2015 0336 Full Code [id number]  Marybelle Killings, MD [facility]      Home/SNF/Other Home  Chief Complaint bleeding ulcear  Level of Care/Admitting Diagnosis ED Disposition    ED Disposition Condition [facility] Area: [facility] [[id number]]  Level of Care: Telemetry [5]  Admit to tele based on following criteria: Other see comments  Comments: GI Bleed  Diagnosis: GI bleed [323557]  Admitting Physician: Merton Border [[id number]]  Attending Physician: Laren Everts, ALI Marshal.Browner  Estimated length of stay: past midnight tomorrow  Certification:: I certify this patient will need inpatient services for at least 2 midnights  PT Class (Do Not Modify): Inpatient [101]  PT Acc Code (Do Not Modify): Private [1]       Medical History Past Medical History:  Diagnosis Date  . Allergy 2006   chemical cauterization in spring 2006, and 2nd treatment with good results.(Dr.Shoemaker)  . Bipolar disorder ([place])   . Bleeding ulcer   . Blood transfusion without reported diagnosis 07/2017   Bleeding ulcer  . Breast cancer ([place]) 12/2014   invasive ductal carcinoma (LEFT)  . Breast cancer of upper-outer quadrant of left female breast ([place]) 12/22/2014  . Breast hematoma 12/19/2015   had left breast aspiration of 4cm hematoma-path indicates benign hematoma  . C. difficile colitis   . C. difficile diarrhea   . Depression   . Osteoporosis 06/20/2016   T-2.5'@spine'  on Dexa ([facility])  . Plantar fasciitis   . PONV (postoperative nausea and vomiting)   . Symptomatic anemia 07/16/2017    Allergies Allergies  Allergen Reactions  .  Clindamycin/Lincomycin Diarrhea    Resulted in C-diff  . Nsaids Other (See Comments)    History of bleeding ulcers   . Other Nausea Only    Stronger pain meds  . Adhesive [Tape] Rash    Pt is sensitive to any kind of adhesive tape products.    IV Location/Drains/Wounds Patient Lines/Drains/Airways Status   Active Line/Drains/Airways    Name:   Placement date:   Placement time:   Site:   Days:   Peripheral IV 06/29/18 Right Antecubital   06/29/18    1756    Antecubital   3   Peripheral IV 07/02/18 Right Hand   07/02/18    -    Hand   less than 1   Incision (Closed) 01/13/15 Chest Right   01/13/15    1000     1266   Incision (Closed) 05/25/15 Breast Left   05/25/15    0902     1134   Incision (Closed) 05/25/15 Axilla Left   05/25/15    0902     1134   Incision (Closed) 02/15/16 Other (Comment)   02/15/16    1529     868          Labs/Imaging Results for orders placed or performed during the hospital encounter of 07/02/18 (from the past 48 hour(s))  CBC with Differential/Platelet     Status: Abnormal   Collection Time: 07/02/18  5:28 PM  Result Value Ref Range   WBC 3.6 (L) 4.0 -  10.5 K/uL   RBC 2.99 (L) 3.87 - 5.11 MIL/uL   Hemoglobin 9.1 (L) 12.0 - 15.0 g/dL   HCT 27.6 (L) 36.0 - 46.0 %   MCV 92.3 78.0 - 100.0 fL   MCH 30.4 26.0 - 34.0 pg   MCHC 33.0 30.0 - 36.0 g/dL   RDW 18.2 (H) 11.5 - 15.5 %   Platelets 219 150 - 400 K/uL   Neutrophils Relative % 60 %   Neutro Abs 2.1 1.7 - 7.7 K/uL   Lymphocytes Relative 28 %   Lymphs Abs 1.0 0.7 - 4.0 K/uL   Monocytes Relative 8 %   Monocytes Absolute 0.3 0.1 - 1.0 K/uL   Eosinophils Relative 3 %   Eosinophils Absolute 0.1 0.0 - 0.7 K/uL   Basophils Relative 1 %   Basophils Absolute 0.0 0.0 - 0.1 K/uL    Comment: Performed at [facility], [place] [address], [place], [place] [address]  Comprehensive metabolic panel     Status: Abnormal   Collection Time: 07/02/18  5:28 PM  Result Value Ref Range   Sodium 143  135 - 145 mmol/L   Potassium 3.7 3.5 - 5.1 mmol/L   Chloride 109 98 - 111 mmol/L   CO2 25 22 - 32 mmol/L   Glucose, Bld 95 70 - 99 mg/dL   BUN 12 8 - 23 mg/dL   Creatinine, Ser 0.71 0.44 - 1.00 mg/dL   Calcium 8.4 (L) 8.9 - 10.3 mg/dL   Total Protein 5.7 (L) 6.5 - 8.1 g/dL   Albumin 3.5 3.5 - 5.0 g/dL   AST 33 15 - 41 U/L   ALT 68 (H) 0 - 44 U/L   Alkaline Phosphatase 60 38 - 126 U/L   Total Bilirubin 0.3 0.3 - 1.2 mg/dL   GFR calc non Af Amer >60 >60 mL/min   GFR calc Af Amer >60 >60 mL/min    Comment: (NOTE) The eGFR has been calculated using the CKD EPI equation. This calculation has not been validated in all clinical situations. eGFR's persistently <60 mL/min signify possible Chronic Kidney Disease.    Anion gap 9 5 - 15    Comment: Performed at [facility], [place] [address], [place], [place] [address]  Lipase, blood     Status: None   Collection Time: 07/02/18  5:28 PM  Result Value Ref Range   Lipase 24 11 - 51 U/L    Comment: Performed at [facility], [place] [address], [place], [place] [address]  Type and screen     Status: None   Collection Time: 07/02/18  6:46 PM  Result Value Ref Range   ABO/RH(D) O POS    Antibody Screen NEG    Sample Expiration      07/05/2018 Performed at [facility], [place] [address], [place], [place] [address]    No results found.  Pending Labs Unresulted Labs (From admission, onward)    Start     Ordered   07/02/18 1846  ABO/Rh  Once,   R     07/02/18 1846   Signed and Held  HIV antibody (Routine Testing)  Once,   R     Signed and Held   Signed and Held  Hemoglobin  Now then every 6 hours,   R     Signed and Held          Vitals/Pain Today's Vitals   07/02/18 1930 07/02/18 2000 07/02/18 2012 07/02/18 2030  BP:  112/66 116/64 116/64 111/64  Pulse: (!) 57 73 (!) 57 64  Resp:   18 18  Temp:   98.4 F (36.9 C)   TempSrc:   Oral   SpO2: 100% 100% 99% 100%  PainSc:         Isolation Precautions No active isolations  Medications Medications  pantoprazole (PROTONIX) 80 mg in sodium chloride 0.9 % 250 mL (0.32 mg/mL) infusion (8 mg/hr Intravenous New Bag/Given 07/02/18 1900)  pantoprazole (PROTONIX) injection 40 mg (has no administration in time range)  pantoprazole (PROTONIX) 80 mg in sodium chloride 0.9 % 100 mL IVPB (0 mg Intravenous Stopped 07/02/18 1858)    Mobility walks

## 2018-07-02 NOTE — Patient Instructions (Signed)
GO TO THE ED AT [facility] FOR FURTHER TREATMENT PER DR DANIS  SALINE LOCK PLACED IN R HAND AFTER IV DISCONTINUED.  YOU HAD AN ENDOSCOPIC PROCEDURE TODAY AT [facility]:   Refer to the procedure report that was given to you for any specific questions about what was found during the examination.  If the procedure report does not answer your questions, please call your gastroenterologist to clarify.  If you requested that your care partner not be given the details of your procedure findings, then the procedure report has been included in a sealed envelope for you to review at your convenience later.  YOU SHOULD EXPECT: Some feelings of bloating in the abdomen. Passage of more gas than usual.  Walking can help get rid of the air that was put into your GI tract during the procedure and reduce the bloating. If you had a lower endoscopy (such as a colonoscopy or flexible sigmoidoscopy) you may notice spotting of blood in your stool or on the toilet paper. If you underwent a bowel prep for your procedure, you may not have a normal bowel movement for a few days.  Please Note:  You might notice some irritation and congestion in your nose or some drainage.  This is from the oxygen used during your procedure.  There is no need for concern and it should clear up in a day or so.  SYMPTOMS TO REPORT IMMEDIATELY:   Following upper endoscopy (EGD)  Vomiting of blood or coffee ground material  New chest pain or pain under the shoulder blades  Painful or persistently difficult swallowing  New shortness of breath  Fever of 100F or higher  Black, tarry-looking stools  For urgent or emergent issues, a gastroenterologist can be reached at any hour by calling [phone].   DIET:  We do recommend a small meal at first, but then you may proceed to your regular diet.  Drink plenty of fluids but you should avoid alcoholic beverages for 24 hours.  ACTIVITY:  You should plan to take it easy for  the rest of today and you should NOT DRIVE or use heavy machinery until tomorrow (because of the sedation medicines used during the test).    FOLLOW UP: Our staff will call the number listed on your records the next business day following your procedure to check on you and address any questions or concerns that you may have regarding the information given to you following your procedure. If we do not reach you, we will leave a message.  However, if you are feeling well and you are not experiencing any problems, there is no need to return our call.  We will assume that you have returned to your regular daily activities without incident.  If any biopsies were taken you will be contacted by phone or by letter within the next 1-3 weeks.  Please call us at [phone] if you have not heard about the biopsies in 3 weeks.    SIGNATURES/CONFIDENTIALITY: You and/or your care partner have signed paperwork which will be entered into your electronic medical record.  These signatures attest to the fact that that the information above on your After Visit Summary has been reviewed and is understood.  Full responsibility of the confidentiality of this discharge information lies with you and/or your care-partner.

## 2018-07-03 ENCOUNTER — Encounter (HOSPITAL_COMMUNITY)
Admission: EM | Disposition: A | Discharge status: Home / Self Care | Payer: Self-pay | Source: Home / Self Care | Attending: Family Medicine

## 2018-07-03 ENCOUNTER — Inpatient Hospital Stay (HOSPITAL_COMMUNITY): Payer: BLUE CROSS/BLUE SHIELD | Admitting: Anesthesiology

## 2018-07-03 ENCOUNTER — Encounter (HOSPITAL_COMMUNITY): Payer: Self-pay | Admitting: *Deleted

## 2018-07-03 ENCOUNTER — Telehealth: Payer: Self-pay | Admitting: *Deleted

## 2018-07-03 HISTORY — PX: BIOPSY: SHX5522

## 2018-07-03 HISTORY — PX: ESOPHAGOGASTRODUODENOSCOPY (EGD) WITH PROPOFOL: SHX5813

## 2018-07-03 LAB — ABO/RH: ABO/RH(D): O POS

## 2018-07-03 LAB — HIV ANTIBODY (ROUTINE TESTING W REFLEX): HIV Screen 4th Generation wRfx: NONREACTIVE

## 2018-07-03 LAB — HEMOGLOBIN
HEMOGLOBIN: 9 g/dL — AB (ref 12.0–15.0)
HEMOGLOBIN: 9.2 g/dL — AB (ref 12.0–15.0)
Hemoglobin: 9.1 g/dL — ABNORMAL LOW (ref 12.0–15.0)

## 2018-07-03 SURGERY — ESOPHAGOGASTRODUODENOSCOPY (EGD) WITH PROPOFOL
Anesthesia: Monitor Anesthesia Care

## 2018-07-03 MED ORDER — KETAMINE HCL 10 MG/ML IJ SOLN
INTRAMUSCULAR | Status: DC | PRN
  Administered 2018-07-03: 20 mg via INTRAVENOUS

## 2018-07-03 MED ORDER — LACTATED RINGERS IV SOLN
INTRAVENOUS | Status: DC
  Administered 2018-07-03: 12:00:00 via INTRAVENOUS

## 2018-07-03 MED ORDER — EPHEDRINE SULFATE 50 MG/ML IJ SOLN
INTRAMUSCULAR | Status: DC | PRN
  Administered 2018-07-03: 10 mg via INTRAVENOUS

## 2018-07-03 MED ORDER — LIDOCAINE HCL 1 % IJ SOLN
INTRAMUSCULAR | Status: DC | PRN
  Administered 2018-07-03: 30 mg via INTRADERMAL

## 2018-07-03 MED ORDER — PANTOPRAZOLE SODIUM 40 MG PO TBEC
40.0000 mg | DELAYED_RELEASE_TABLET | Freq: Two times a day (BID) | ORAL | Status: DC
  Administered 2018-07-03 – 2018-07-04 (×2): 40 mg via ORAL
  Filled 2018-07-03 (×2): qty 1

## 2018-07-03 MED ORDER — PROPOFOL 10 MG/ML IV BOLUS
INTRAVENOUS | Status: DC | PRN
  Administered 2018-07-03 (×7): 20 mg via INTRAVENOUS
  Administered 2018-07-03: 30 mg via INTRAVENOUS
  Administered 2018-07-03 (×11): 20 mg via INTRAVENOUS
  Administered 2018-07-03: 30 mg via INTRAVENOUS

## 2018-07-03 SURGICAL SUPPLY — 14 items

## 2018-07-03 NOTE — Anesthesia Postprocedure Evaluation (Signed)
Anesthesia Post Note  Patient: Rhonda Gardner  Procedure(s) Performed: ESOPHAGOGASTRODUODENOSCOPY (EGD) WITH PROPOFOL (N/A ) BIOPSY     Patient location during evaluation: PACU Anesthesia Type: MAC Level of consciousness: awake and alert Pain management: pain level controlled Vital Signs Assessment: post-procedure vital signs reviewed and stable Respiratory status: spontaneous breathing, nonlabored ventilation, respiratory function stable and patient connected to nasal cannula oxygen Cardiovascular status: stable and blood pressure returned to baseline Postop Assessment: no apparent nausea or vomiting Anesthetic complications: no    Last Vitals:  Vitals:   07/03/18 1130 07/03/18 1300  BP: (!) 133/44 (!) 79/28  Pulse: 65 75  Resp: 15 17  Temp: 36.7 C 36.7 C  SpO2: 99% 100%    Last Pain:  Vitals:   07/03/18 1300  TempSrc: Oral  PainSc: 0-No pain                 Adriana Lina

## 2018-07-03 NOTE — Telephone Encounter (Signed)
  Follow up Call-  Call back number 07/02/2018  Post procedure Call Back phone  # [phone]  Permission to leave phone message Yes  Some recent data might be hidden     Patient questions:  Patient is in the hospital, but hopes to be discharged today.

## 2018-07-03 NOTE — Op Note (Signed)
[facility] Patient Name: Rhonda Gardner Procedure Date: 07/03/2018 MRN: [id number] Attending MD: Gatha Mayer , MD Date of Birth: 03-01-1956 CSN: [id number] Age: 62 Admit Type: Inpatient Procedure:                Upper GI endoscopy Indications:              Duodenal ulcer with hemorrhage, Follow-up of                            duodenal ulcer with hemorrhage Providers:                Gatha Mayer, MD, Cleda Daub, RN, Charolette Child, Technician, Karis Juba, CRNA Referring MD:              Medicines:                Propofol per Anesthesia, Monitored Anesthesia Care Complications:            No immediate complications. Estimated Blood Loss:     Estimated blood loss was minimal. Procedure:                After obtaining informed consent, the endoscope was                            passed under direct vision. Throughout the                            procedure, the patient's blood pressure, pulse, and                            oxygen saturations were monitored continuously. The                            GIF-H190 ([id number]) Olympus adult endoscope was                            introduced through the mouth, and advanced to the                            second part of duodenum. The upper GI endoscopy was                            accomplished without difficulty. The patient                            tolerated the procedure well. Scope In: Scope Out: Findings:      One oozing cratered duodenal ulcer was found in the area of the papilla.       Biopsies were taken with a cold forceps for histology. Verification of       patient identification for the specimen was done. Estimated blood loss       was minimal.      The exam was otherwise without abnormality.      The cardia and gastric fundus were normal on retroflexion.  Impression:               - One oozing duodenal ulcer. Biopsied.                           - The  examination was otherwise normal. Moderate Sedation:      N/A- Per Anesthesia Care Recommendation:           - Return patient to hospital ward for ongoing care.                           - Clear liquid diet.                           - I will review imaging - I suspect a possible                            abnormality in head of pancreas.                           she does not appear to be at great risk of major                            hemorrhage                           she could need a CT w/ differenty technique vs MR                            or also an EUS                           she may be able to go home soon also                           I will f/u later today Procedure Code(s):        --- Professional ---                           [phone], Esophagogastroduodenoscopy, flexible,                            transoral; with biopsy, single or multiple Diagnosis Code(s):        --- Professional ---                           K26.4, Chronic or unspecified duodenal ulcer with                            hemorrhage CPT copyright 2017 [employer]. All rights reserved. The codes documented in this report are preliminary and upon coder review may  be revised to meet current compliance requirements. Gatha Mayer, MD 07/03/2018 1:13:47 PM This report has been signed electronically. Number of Addenda: 0

## 2018-07-03 NOTE — Transfer of Care (Signed)
Immediate Anesthesia Transfer of Care Note  Patient: Rhonda Gardner  Procedure(s) Performed: ESOPHAGOGASTRODUODENOSCOPY (EGD) WITH PROPOFOL (N/A ) BIOPSY  Patient Location: PACU and Endoscopy Unit  Anesthesia Type:MAC  Level of Consciousness: awake, alert  and oriented  Airway & Oxygen Therapy: Patient Spontanous Breathing and Patient connected to nasal cannula oxygen  Post-op Assessment: Report given to RN and Post -op Vital signs reviewed and stable  Post vital signs: Reviewed and stable  Last Vitals:  Vitals Value Taken Time  BP 101/44 07/03/2018  1:02 PM  Temp 36.7 C 07/03/2018  1:00 PM  Pulse 71 07/03/2018  1:04 PM  Resp 19 07/03/2018  1:04 PM  SpO2 100 % 07/03/2018  1:04 PM  Vitals shown include unvalidated device data.  Last Pain:  Vitals:   07/03/18 1300  TempSrc: Oral  PainSc: 0-No pain         Complications: No apparent anesthesia complications

## 2018-07-03 NOTE — Progress Notes (Signed)
PROGRESS NOTE [facility]   Rhonda Gardner   WPY:[id number] DOB: [birth year]  DOA: 07/02/2018 PCP: Rita Ohara, MD   Brief Narrative:  Rhonda Gardner is a 62 year old female with past medical history of duodenal ulcer, breast cancer and bipolar disorder who presented to the emergency department after being sent from the GI office due to concerns of bleeding ulcer.  Patient underwent EGD on 9/19 and found recurrence duodenal ulcer with possible bleeding.  Lab work-up was done and showed hemoglobin dropped from 10.3-9.1 therefore she was sent to the emergency department for further monitoring and evaluation.  Patient admitted with working diagnosis of bleeding ulcer.  GI was consulted and performed EGD on 9/20 showing 1 oozing cratered duodenal ulcer, biopsies were taken and no other interventions were performed.  Subjective: She is seen and examined, she has no complaints.  Denies nausea, vomiting and abdominal pain  Assessment & Plan: PUD/GI bleed  Patient underwent EGD, oozing cratered duodenal ulcer, biopsies were taken. Continue management per GI, clear liquids.  PPI daily.  Acute blood loss anemia due to GI bleed Hemoglobin has been stable.  Check CBC every 12 hours. No overt bleeding.  Transfuse if hemoglobin less than 7.  Transaminitis Unclear etiology at this time, patient with gallbladder sludge.  Gallbladder is distended, no gallstones were identified.  GI dividing on doing HIDA scan versus EUS versus MRCP.  Continue to monitor LFTs, if they are trending down.  DVT prophylaxis: SCD's  Code Status:  Full Code  Family Communication: Husband at bedside  Disposition Plan: Home when cleared by GI   Consultants:   GI   Procedures:   EGD 9/19 and 9/20  Antimicrobials:  None   Objective: Vitals:   07/02/18 2120 07/03/18 0530 07/03/18 1130 07/03/18 1300  BP: 111/61 115/68 (!) 133/44 (!) 79/28  Pulse: (!) 56 (!) 56 65 75  Resp: 17 17 15 17   Temp:  97.9 F (36.6 C) 98.6 F (37 C) 98.1 F (36.7 C) 98.1 F (36.7 C)  TempSrc: Oral Oral Oral Oral  SpO2: 100% 98% 99% 100%  Weight: 65.7 kg  65.7 kg   Height: 5' 6.5" (1.689 m)  5' 6.5" (1.689 m)     Intake/Output Summary (Last 24 hours) at 07/03/2018 1613 Last data filed at 07/03/2018 1255 Gross per 24 hour  Intake 1553.49 ml  Output 0 ml  Net 1553.49 ml   Filed Weights   07/02/18 2120 07/03/18 1130  Weight: 65.7 kg 65.7 kg    Examination:  General: Pt is alert, awake, not in acute distress Cardiovascular: RRR, S1/S2 +, no rubs, no gallops Respiratory: CTA bilaterally, no wheezing, no rhonchi Abdominal: Soft, NT, ND, bowel sounds + Extremities: no edema, no cyanosis  Data Reviewed: I have personally reviewed following labs and imaging studies  CBC: Recent Labs  Lab 06/29/18 1808 07/02/18 1728 07/02/18 2138 07/03/18 0331 07/03/18 0912 07/03/18 1505  WBC 9.1 3.6*  --   --   --   --   NEUTROABS  --  2.1  --   --   --   --   HGB 10.3* 9.1* 9.8* 9.1* 9.0* 9.2*  HCT 33.0* 27.6*  --   --   --   --   MCV 93.8 92.3  --   --   --   --   PLT 205 219  --   --   --   --    Basic Metabolic Panel: Recent Labs  Lab 06/29/18 1808 07/02/18  1728  NA 139 143  K 3.6 3.7  CL 105 109  CO2 23 25  GLUCOSE 112* 95  BUN 27* 12  CREATININE 0.85 0.71  CALCIUM 8.8* 8.4*   GFR: Estimated Creatinine Clearance: 67.9 mL/min (by C-G formula based on SCr of 0.71 mg/dL). Liver Function Tests: Recent Labs  Lab 06/29/18 1808 07/02/18 1728  AST 190* 33  ALT 67* 68*  ALKPHOS 70 60  BILITOT 0.5 0.3  PROT 5.7* 5.7*  ALBUMIN 3.5 3.5   Recent Labs  Lab 06/29/18 1808 07/02/18 1728  LIPASE 199* 24   No results for input(s): AMMONIA in the last 168 hours. Coagulation Profile: No results for input(s): INR, PROTIME in the last 168 hours. Cardiac Enzymes: No results for input(s): CKTOTAL, CKMB, CKMBINDEX, TROPONINI in the last 168 hours. BNP (last 3 results) No results for  input(s): PROBNP in the last 8760 hours. HbA1C: No results for input(s): HGBA1C in the last 72 hours. CBG: No results for input(s): GLUCAP in the last 168 hours. Lipid Profile: No results for input(s): CHOL, HDL, LDLCALC, TRIG, CHOLHDL, LDLDIRECT in the last 72 hours. Thyroid Function Tests: No results for input(s): TSH, T4TOTAL, FREET4, T3FREE, THYROIDAB in the last 72 hours. Anemia Panel: No results for input(s): VITAMINB12, FOLATE, FERRITIN, TIBC, IRON, RETICCTPCT in the last 72 hours. Sepsis Labs: No results for input(s): PROCALCITON, LATICACIDVEN in the last 168 hours.  No results found for this or any previous visit (from the past 240 hour(s)).    Radiology Studies: No results found.    Scheduled Meds: . Influenza vac split quadrivalent PF  0.5 mL Intramuscular Tomorrow-1000  . pantoprazole  40 mg Oral BID AC   Continuous Infusions: . sodium chloride 75 mL/hr at 07/03/18 1000     LOS: 1 day    Time spent: Total of 15 minutes spent with pt, greater than 50% of which was spent in discussion of  treatment, counseling and coordination of care    Chipper Oman, MD Pager: Text Page via [email or web address]   If 7PM-7AM, please contact night-coverage [email or web address] 07/03/2018, 4:13 PM   Note - This record has been created using [employer]. Chart creation errors have been sought, but may not always have been located. Such creation errors do not reflect on the standard of medical care.

## 2018-07-03 NOTE — Progress Notes (Addendum)
Patient ID: Rhonda Gardner, female   DOB: August 03, 1956, 62 y.o.   MRN: [id number]    Progress Note   Subjective   Patient was admitted last evening, after undergoing EGD at our office yesterday afternoon with finding of a large cratered oozing duodenal ulcer which appeared to be involving at least two thirds of the circumference of the duodenal wall in the periampullary region.  Visualization challenging due to ulcer size and location..  Hemoglobin 9.1 this morning, 9.1 last evening and was 10.3 on 06/29/2018 LFTs yesterday normal with exception of ALT at 68.  She had had a bump in LFTs when initially evaluated after acute onset of pain.  Ultrasound showed gallbladder sludge and top normal gallbladder wall thickness no gallstones trace pericholecystic fluid CBD of 2.9 mm.  She currently feels fine, epigastric pain has resolved, no stools since yesterday am.   Objective   Vital signs in last 24 hours: Temp:  [97.5 F (36.4 C)-98.6 F (37 C)] 98.6 F (37 C) (09/20 0530) Pulse Rate:  [56-105] 56 (09/20 0530) Resp:  [14-19] 17 (09/20 0530) BP: (99-135)/(54-74) 115/68 (09/20 0530) SpO2:  [95 %-100 %] 98 % (09/20 0530) Weight:  [65.7 kg-65.8 kg] 65.7 kg (09/19 2120) Last BM Date: 07/02/18 General:    white female in NAD,pleasant Heart:  Regular rate and rhythm; no murmurs Lungs: Respirations even and unlabored, lungs CTA bilaterally Abdomen:  Soft, nontender and nondistended. Normal bowel sounds. Extremities:  Without edema. Neurologic:  Alert and oriented,  grossly normal neurologically. Psych:  Cooperative. Normal mood and affect.  Intake/Output from previous day: 09/19 0701 - 09/20 0700 In: 977.9 [P.O.:120; I.V.:857.9] Out: -  Intake/Output this shift: No intake/output data recorded.  Lab Results: Recent Labs    07/02/18 1728 07/02/18 2138 07/03/18 0331  WBC 3.6*  --   --   HGB 9.1* 9.8* 9.1*  HCT 27.6*  --   --   PLT 219  --   --    BMET Recent Labs     07/02/18 1728  NA 143  K 3.7  CL 109  CO2 25  GLUCOSE 95  BUN 12  CREATININE 0.71  CALCIUM 8.4*   LFT Recent Labs    07/02/18 1728  PROT 5.7*  ALBUMIN 3.5  AST 33  ALT 68*  ALKPHOS 60  BILITOT 0.3   PT/INR No results for input(s): LABPROT, INR in the last 72 hours.     Assessment / Plan:    #52 62 year old female with onset acute abdominal pain over the past several days, and then onset of melena.  EGD yesterday done as an outpatient revealed a large cratered oozing duodenal ulcer involving two thirds of the duodenal wall and close to the ampulla.  Visualization difficult due to size and position of the ulcer. Patient advised to be admitted last evening because of continued slow GI blood loss and need for repeat EGD, biopsy and possible endoscopic therapy for control of bleeding.   #2  History of superficial duodenal ulcer measuring 7 mm with GI bleed requiring epi and BiCAP October 2018  #3 anemia acute secondary to GI blood loss #4 gallbladder sludge and upper normal gallbladder wall thickness trace pericholecystic fluid on recent imaging with CT and ultrasound 06/29/2018 Patient also had bump in LFTs-she has improved.  #5 history of breast cancer  Not clear whether she has a separate biliary issue or whether bump in LFTs perhaps secondary to the large duodenal ulcer close to the ampulla causing some  edema/low-grade obstruction.  Plan; EGD later this am with BX Continue IV PPI infusion  Serial hgb's, transfuse for hgb 7- 8 range Will determine need for further workup/ EUS etc pending EGD findings    Contact  Amy Esterwood, P.A.-C               [phone]      Active Problems:   GI bleed     LOS: 1 day   Amy Esterwood  07/03/2018, 8:57 AM   Agree with [name] assessment and plan. Rhonda Mayer, MD, Rhonda Gardner

## 2018-07-03 NOTE — Anesthesia Preprocedure Evaluation (Addendum)
Anesthesia Evaluation  Patient identified by MRN, date of birth, ID band Patient awake    Reviewed: Allergy & Precautions, H&P , NPO status , Patient's Chart, lab work & pertinent test results, reviewed documented beta blocker date and time   History of Anesthesia Complications (+) PONV and history of anesthetic complications  Airway Mallampati: II  TM Distance: >3 FB Neck ROM: full    Dental no notable dental hx.    Pulmonary neg pulmonary ROS,    Pulmonary exam normal breath sounds clear to auscultation       Cardiovascular Exercise Tolerance: Good negative cardio ROS   Rhythm:regular Rate:Normal  ECHO 16  - Left ventricle: The cavity size was normal. Systolic function was   vigorous. The estimated ejection fraction was in the range of 65%   to 70%. Wall motion was normal; there were no regional wall   motion abnormalities. Left ventricular diastolic function   parameters were normal. - Left atrium: The atrium was mildly dilated. - Right ventricle: The cavity size was normal. Wall thickness was   normal. Systolic function was normal. - Atrial septum: No defect or patent foramen ovale was identified. - Tricuspid valve: There was moderate regurgitation. - Pulmonary arteries: Systolic pressure was moderately to severely   increased. PA peak pressure: 54 mm Hg (S). - Global longitudinal strain -24.1%.   Neuro/Psych PSYCHIATRIC DISORDERS Depression Bipolar Disorder  Neuromuscular disease negative neurological ROS     GI/Hepatic Neg liver ROS, PUD, Heme positive stool   Endo/Other  negative endocrine ROS  Renal/GU negative Renal ROS  negative genitourinary   Musculoskeletal   Abdominal   Peds  Hematology  (+) anemia ,   Anesthesia Other Findings   Reproductive/Obstetrics Breast CA                            Anesthesia Physical  Anesthesia Plan  ASA: III  Anesthesia Plan: MAC    Post-op Pain Management:    Induction: Intravenous  PONV Risk Score and Plan: 3 and Propofol infusion and Treatment may vary due to age or medical condition  Airway Management Planned: Simple Face Mask and Nasal Cannula  Additional Equipment:   Intra-op Plan:   Post-operative Plan:   Informed Consent: I have reviewed the patients History and Physical, chart, labs and discussed the procedure including the risks, benefits and alternatives for the proposed anesthesia with the patient or authorized representative who has indicated his/her understanding and acceptance.   Dental Advisory Given  Plan Discussed with: CRNA, Anesthesiologist and Surgeon  Anesthesia Plan Comments:        Anesthesia Quick Evaluation

## 2018-07-04 DIAGNOSIS — K922 Gastrointestinal hemorrhage, unspecified: Secondary | ICD-10-CM

## 2018-07-04 DIAGNOSIS — K264 Chronic or unspecified duodenal ulcer with hemorrhage: Principal | ICD-10-CM

## 2018-07-04 LAB — HEPATIC FUNCTION PANEL
ALBUMIN: 3.4 g/dL — AB (ref 3.5–5.0)
ALK PHOS: 50 U/L (ref 38–126)
ALT: 44 U/L (ref 0–44)
AST: 22 U/L (ref 15–41)
BILIRUBIN TOTAL: 0.5 mg/dL (ref 0.3–1.2)
Bilirubin, Direct: 0.1 mg/dL (ref 0.0–0.2)
Indirect Bilirubin: 0.4 mg/dL (ref 0.3–0.9)
TOTAL PROTEIN: 5.5 g/dL — AB (ref 6.5–8.1)

## 2018-07-04 LAB — CBC WITH DIFFERENTIAL/PLATELET
BASOS ABS: 0 10*3/uL (ref 0.0–0.1)
BASOS PCT: 1 %
EOS ABS: 0.1 10*3/uL (ref 0.0–0.7)
Eosinophils Relative: 3 %
HEMATOCRIT: 28.4 % — AB (ref 36.0–46.0)
HEMOGLOBIN: 9.3 g/dL — AB (ref 12.0–15.0)
Lymphocytes Relative: 28 %
Lymphs Abs: 0.9 10*3/uL (ref 0.7–4.0)
MCH: 30.8 pg (ref 26.0–34.0)
MCHC: 32.7 g/dL (ref 30.0–36.0)
MCV: 94 fL (ref 78.0–100.0)
Monocytes Absolute: 0.3 10*3/uL (ref 0.1–1.0)
Monocytes Relative: 10 %
NEUTROS ABS: 1.9 10*3/uL (ref 1.7–7.7)
NEUTROS PCT: 58 %
Platelets: 226 10*3/uL (ref 150–400)
RBC: 3.02 MIL/uL — ABNORMAL LOW (ref 3.87–5.11)
RDW: 18 % — ABNORMAL HIGH (ref 11.5–15.5)
WBC: 3.3 10*3/uL — AB (ref 4.0–10.5)

## 2018-07-04 MED ORDER — PANTOPRAZOLE SODIUM 40 MG PO TBEC: 40.0000 mg | DELAYED_RELEASE_TABLET | Freq: Two times a day (BID) | ORAL | 0 refills | Status: DC

## 2018-07-04 NOTE — Discharge Summary (Signed)
Physician Discharge Summary  Versia Mignogna Cox-Fishman IZT:[id number] DOB: 17-Jan-1956 DOA: 07/02/2018  PCP: Rita Ohara, MD  Admit date: 07/02/2018 Discharge date: 07/04/2018  Admitted From home Disposition:  home Recommendations for Outpatient Follow-up:  1. Follow up with PCP in 1-2 weeks 2. Please obtain CMP/CBC in one week 3. Follow up with dr Carlean Purl this week for repeat hemoglobin and biopsy results  Home Health: None Equipment/Devices none Discharge Condition stable CODE STATUS full code Diet recommendation: Soft diet Brief/Interim Summary:62 year old female with past medical history of duodenal ulcer, breast cancer and bipolar disorder who presented to the emergency department after being sent from the GI office due to concerns of bleeding ulcer.  Patient underwent EGD on 9/19 and found recurrence duodenal ulcer with possible bleeding.  Lab work-up was done and showed hemoglobin dropped from 10.3-9.1 therefore she was sent to the emergency department for further monitoring and evaluation.  Patient admitted with working diagnosis of bleeding ulcer.  GI was consulted and performed EGD on 9/20 showing 1 oozing cratered duodenal ulcer, biopsies were taken and no other interventions were performed.   Discharge Diagnoses:  Active Problems:   Duodenal ulcer with hemorrhage   GI bleed PUD/GI bleed  Patient underwent EGD, oozing cratered duodenal ulcer, biopsies were taken.  Results pending.  Will discharge patient on twice a day Protonix.  Acute blood loss anemia due to GI bleed Hemoglobin  stable.   Transaminitis improved. Unclear etiology at this time, patient with gallbladder sludge.  Gallbladder is distended, no gallstones were identified.  Discharge Instructions   Allergies as of 07/04/2018      Reactions   Clindamycin/lincomycin Diarrhea   Resulted in C-diff   Nsaids Other (See Comments)   History of bleeding ulcers   Other Nausea Only   Stronger pain meds   Adhesive  [tape] Rash   Pt is sensitive to any kind of adhesive tape products.      Medication List    STOP taking these medications   amoxicillin-clavulanate 875-125 MG tablet Commonly known as:  AUGMENTIN   omeprazole 20 MG capsule Commonly known as:  PRILOSEC     TAKE these medications   fluticasone 50 MCG/ACT nasal spray Commonly known as:  FLONASE Place 1 spray into both nostrils daily as needed for allergies.   LAMICTAL 200 MG tablet Generic drug:  lamoTRIgine Take 0.5 tablets (100 mg total) by mouth daily.   LUTEIN-ZEAXANTHIN PO Take 1 tablet by mouth daily.   magnesium 30 MG tablet Take 30 mg by mouth daily.   ondansetron 4 MG disintegrating tablet Commonly known as:  ZOFRAN-ODT Take 1 tablet (4 mg total) by mouth every 8 (eight) hours as needed for nausea.   pantoprazole 40 MG tablet Commonly known as:  PROTONIX Take 1 tablet (40 mg total) by mouth 2 (two) times daily before a meal.   PROBIOTIC PO Take 1 tablet by mouth 2 (two) times daily.   promethazine 25 MG tablet Commonly known as:  PHENERGAN Take 1 tablet (25 mg total) by mouth every 6 (six) hours as needed for nausea or vomiting.   Vitamin D 2000 units tablet Take 2,000 Units by mouth daily.   ZOMETA IV Inject into the vein every 6 (six) months.      Follow-up Information    Rita Ohara, MD Follow up.   Specialty:  Family Medicine Contact information: [place] [id number] [phone]        Gatha Mayer, MD Follow up.   Specialty:  Gastroenterology Contact information: [address] [address] [phone]          Allergies  Allergen Reactions  . Clindamycin/Lincomycin Diarrhea    Resulted in C-diff  . Nsaids Other (See Comments)    History of bleeding ulcers   . Other Nausea Only    Stronger pain meds  . Adhesive [Tape] Rash    Pt is sensitive to any kind of adhesive tape products.    Consultations:  Lab our  GI   Procedures/Studies: Ct Abdomen Pelvis W Contrast  Result Date: 06/30/2018 CLINICAL DATA:  Sudden onset severe epigastric abdominal pain in our ago. Nausea. EXAM: CT ABDOMEN AND PELVIS WITH CONTRAST TECHNIQUE: Multidetector CT imaging of the abdomen and pelvis was performed using the standard protocol following bolus administration of intravenous contrast. CONTRAST:  151mL OMNIPAQUE IOHEXOL 300 MG/ML  SOLN COMPARISON:  None. FINDINGS: Lower chest: The lung bases are clear. Hepatobiliary: Subcentimeter low-attenuation lesion in the dome of the liver is too small to characterize but probably a cyst or hemangioma. Gallbladder wall is thickened with mild pericholecystic edema. No stones are identified but this could indicate cholecystitis. No bile duct dilatation. Pancreas: Unremarkable. No pancreatic ductal dilatation or surrounding inflammatory changes. Spleen: Normal in size without focal abnormality. Adrenals/Urinary Tract: No adrenal gland nodules. Renal nephrograms are symmetrical and homogeneous. Prominent parapelvic cysts in the left kidney. No hydronephrosis or hydroureter. Bladder is unremarkable. Stomach/Bowel: Stomach, small bowel, and colon are not abnormally distended. Opaque medication in the duodenum and cecum. Scattered stool in the colon. No inflammatory infiltration. No discrete wall thickening appreciated. Appendix is normal. Vascular/Lymphatic: No significant vascular findings are present. No enlarged abdominal or pelvic lymph nodes. Reproductive: Uterus and bilateral adnexa are unremarkable. Other: No free air or free fluid in the abdomen. Abdominal wall musculature appears intact. Musculoskeletal: Tarlov cysts in the sacrum. No destructive bone lesions. IMPRESSION: 1. No evidence of bowel obstruction or inflammation. Appendix is normal. 2. Gallbladder wall is thickened and there is mild pericholecystic edema. No stones are identified but this could indicate cholecystitis in the  appropriate clinical setting. Consider ultrasound for further evaluation if clinically indicated. 3. Prominent parapelvic cysts in the left kidney. No hydronephrosis or hydroureter. 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 Electronically Signed   By: Lucienne Capers M.D.   On: 06/30/2018 00:03   US Abdomen Limited Ruq  Result Date: 06/30/2018 CLINICAL DATA:  Abnormal gallbladder seen on recent CT. EXAM: ULTRASOUND ABDOMEN LIMITED RIGHT UPPER QUADRANT COMPARISON:  CT study from 06/29/2018 FINDINGS: Gallbladder: Distended gallbladder with top normal gallbladder wall thickness of 3 mm. No gallstones are identified. Trace pericholecystic fluid. Common bile duct: Diameter: 2.9 mm proximally and normal. Liver: No focal lesion identified. Within normal limits in parenchymal echogenicity. Portal vein is patent on color Doppler imaging with normal direction of blood flow towards the liver. IMPRESSION: The gallbladder is distended and there is a top-normal thickness of the gallbladder wall to 3 mm. Trace pericholecystic fluid is identified. No gallstones are identified however. Findings may represent an acalculous cholecystitis. HIDA scan may help for further correlation if findings are discordant from clinical exam/suspicion. Electronically Signed   By: Ashley Royalty M.D.   On: 06/30/2018 01:05    (Echo, Carotid, EGD, Colonoscopy, ERCP)    Subjective: She feels well denies any new complaints nausea vomiting dressed up to be discharged.  Denies any abdominal pain or having bloody bowel movements.   Discharge Exam: Vitals:   07/03/18 2203 07/04/18 0423  BP: (!) 97/58 (!) 95/42  Pulse: 63 70  Resp:  20  Temp:  98.1 F (36.7 C)  SpO2: 100% 100%   Vitals:   07/03/18 2200 07/03/18 2201 07/03/18 2203 07/04/18 0423  BP:  (!) 92/52 (!) 97/58 (!) 95/42  Pulse: 65 (!) 59 63 70  Resp:  16  20  Temp:  98.4 F (36.9 C)  98.1 F (36.7 C)  TempSrc:  Oral    SpO2: 98% 100% 100% 100%  Weight:      Height:         General: Pt is alert, awake, not in acute distress Cardiovascular: RRR, S1/S2 +, no rubs, no gallops Respiratory: CTA bilaterally, no wheezing, no rhonchi Abdominal: Soft, NT, ND, bowel sounds + Extremities: no edema, no cyanosis    The results of significant diagnostics from this hospitalization (including imaging, microbiology, ancillary and laboratory) are listed below for reference.     Microbiology: No results found for this or any previous visit (from the past 240 hour(s)).   Labs: BNP (last 3 results) No results for input(s): BNP in the last 8760 hours. Basic Metabolic Panel: Recent Labs  Lab 06/29/18 1808 07/02/18 1728  NA 139 143  K 3.6 3.7  CL 105 109  CO2 23 25  GLUCOSE 112* 95  BUN 27* 12  CREATININE 0.85 0.71  CALCIUM 8.8* 8.4*   Liver Function Tests: Recent Labs  Lab 06/29/18 1808 07/02/18 1728 07/04/18 0448  AST 190* 33 22  ALT 67* 68* 44  ALKPHOS 70 60 50  BILITOT 0.5 0.3 0.5  PROT 5.7* 5.7* 5.5*  ALBUMIN 3.5 3.5 3.4*   Recent Labs  Lab 06/29/18 1808 07/02/18 1728  LIPASE 199* 24   No results for input(s): AMMONIA in the last 168 hours. CBC: Recent Labs  Lab 06/29/18 1808 07/02/18 1728 07/02/18 2138 07/03/18 0331 07/03/18 0912 07/03/18 1505 07/04/18 0448  WBC 9.1 3.6*  --   --   --   --  3.3*  NEUTROABS  --  2.1  --   --   --   --  1.9  HGB 10.3* 9.1* 9.8* 9.1* 9.0* 9.2* 9.3*  HCT 33.0* 27.6*  --   --   --   --  28.4*  MCV 93.8 92.3  --   --   --   --  94.0  PLT 205 219  --   --   --   --  226   Cardiac Enzymes: No results for input(s): CKTOTAL, CKMB, CKMBINDEX, TROPONINI in the last 168 hours. BNP: Invalid input(s): POCBNP CBG: No results for input(s): GLUCAP in the last 168 hours. D-Dimer No results for input(s): DDIMER in the last 72 hours. Hgb A1c No results for input(s): HGBA1C in the last 72 hours. Lipid Profile No results for input(s): CHOL, HDL, LDLCALC, TRIG, CHOLHDL, LDLDIRECT in the last 72 hours. Thyroid  function studies No results for input(s): TSH, T4TOTAL, T3FREE, THYROIDAB in the last 72 hours.  Invalid input(s): FREET3 Anemia work up No results for input(s): VITAMINB12, FOLATE, FERRITIN, TIBC, IRON, RETICCTPCT in the last 72 hours. Urinalysis    Component Value Date/Time   COLORURINE YELLOW 06/29/2018 1933   APPEARANCEUR CLEAR 06/29/2018 1933   LABSPEC 1.015 06/29/2018 1933   LABSPEC 1.030 04/13/2018 0945   PHURINE 7.0 06/29/2018 1933   GLUCOSEU NEGATIVE 06/29/2018 1933   GLUCOSEU Negative 04/03/2015 [place] 06/29/2018 [place] 06/29/2018 1933   BILIRUBINUR negative 04/13/2018 0945  BILIRUBINUR neg 04/10/2017 0858   BILIRUBINUR Negative 04/03/2015 1451   KETONESUR 5 (A) 06/29/2018 1933   PROTEINUR NEGATIVE 06/29/2018 1933   UROBILINOGEN negative (A) 04/10/2017 0858   UROBILINOGEN 0.2 04/03/2015 1451   NITRITE NEGATIVE 06/29/2018 1933   LEUKOCYTESUR NEGATIVE 06/29/2018 1933   LEUKOCYTESUR Small 04/03/2015 1451   Sepsis Labs Invalid input(s): PROCALCITONIN,  WBC,  LACTICIDVEN Microbiology No results found for this or any previous visit (from the past 240 hour(s)).   Time coordinating discharge: 34 minutes  SIGNED:   Georgette Shell, MD  [facility] 07/04/2018, 9:22 AM Pager   If 7PM-7AM, please contact night-coverage [email or web address] Password [email or web address]

## 2018-07-04 NOTE — Progress Notes (Signed)
Patient ID: Rhonda Gardner, female   DOB: 09-25-1956, 62 y.o.   MRN: [id number]    Progress Note   Subjective  Doing well, no complaints  No BM's, no abdominal pain   LFT's normal ! hgb 9.3 stable  Objective   Vital signs in last 24 hours: Temp:  [98.1 F (36.7 C)-98.4 F (36.9 C)] 98.1 F (36.7 C) (09/21 0423) Pulse Rate:  [59-75] 70 (09/21 0423) Resp:  [15-20] 20 (09/21 0423) BP: (79-133)/(28-58) 95/42 (09/21 0423) SpO2:  [98 %-100 %] 100 % (09/21 0423) Weight:  [65.7 kg] 65.7 kg (09/20 1130) Last BM Date: 07/02/18 General:    white female in NAD Heart:  Regular rate and rhythm; no murmurs Lungs: Respirations even and unlabored, lungs CTA bilaterally Abdomen:  Soft, nontender and nondistended. Normal bowel sounds. Extremities:  Without edema. Neurologic:  Alert and oriented,  grossly normal neurologically. Psych:  Cooperative. Normal mood and affect.  Intake/Output from previous day: 09/20 0701 - 09/21 0700 In: 999.9 [I.V.:999.9] Out: 0  Intake/Output this shift: No intake/output data recorded.  Lab Results: Recent Labs    07/02/18 1728  07/03/18 0912 07/03/18 1505 07/04/18 0448  WBC 3.6*  --   --   --  3.3*  HGB 9.1*   < > 9.0* 9.2* 9.3*  HCT 27.6*  --   --   --  28.4*  PLT 219  --   --   --  226   < > = values in this interval not displayed.   BMET Recent Labs    07/02/18 1728  NA 143  K 3.7  CL 109  CO2 25  GLUCOSE 95  BUN 12  CREATININE 0.71  CALCIUM 8.4*   LFT Recent Labs    07/04/18 0448  PROT 5.5*  ALBUMIN 3.4*  AST 22  ALT 44  ALKPHOS 50  BILITOT 0.5  BILIDIR 0.1  IBILI 0.4   PT/INR No results for input(s): LABPROT, INR in the last 72 hours.     Assessment / Plan:     #1 62 yo female admitted after endoscopy on 07/02/2018 with finding of a large cratered oozing duodenal ulcer involving two thirds of the duodenal wall very close to the ampulla.  Visualization was difficult due to size and position of the  ulcer.  Patient underwent repeat EGD yesterday, also noted to have some continued oozing, no visible vessel no endoscopic therapy done.  Ulcer was biopsied results pending  She is stable, has no evidence of active bleeding, hemoglobin stable and is tolerating p.o.'s without difficulty.  #2 elevated LFTs at onset of symptoms earlier this week with abdominal discomfort and then melena--She has biliary sludge but no evidence of cholecystitis or choledocholithiasis. Wonder if bump in LFTs secondary to edema of the ampulla No evidence of pancreatic abnormality or bile duct abnormality on review of CT per Dr. Carlean Purl with radiology.  #3 history of breast cancer 2016 #4  anemia secondary to acute blood loss  Plan; discharge home today Discussed plans with patient and her husband. Please discharge on Protonix 40 mg p.o. twice daily No aspirin or NSAIDs We will follow-up biopsies Will have her come to the office later this week for repeat hemoglobin for office follow-up within the next couple of weeks.          Contact  Brittanyann Wittner [place], P.A.-C               [phone]      Active Problems:  Duodenal ulcer with hemorrhage   GI bleed     LOS: 2 days   Ailed Defibaugh  07/04/2018, 8:42 AM

## 2018-07-06 ENCOUNTER — Encounter: Payer: Self-pay | Admitting: Family Medicine

## 2018-07-06 ENCOUNTER — Telehealth: Payer: Self-pay

## 2018-07-06 ENCOUNTER — Ambulatory Visit (HOSPITAL_COMMUNITY): Admission: RE | Admit: 2018-07-06 | Payer: BLUE CROSS/BLUE SHIELD | Source: Ambulatory Visit

## 2018-07-06 ENCOUNTER — Encounter (HOSPITAL_COMMUNITY): Payer: Self-pay | Admitting: Internal Medicine

## 2018-07-06 DIAGNOSIS — K264 Chronic or unspecified duodenal ulcer with hemorrhage: Secondary | ICD-10-CM

## 2018-07-06 NOTE — Telephone Encounter (Signed)
-----   Message from Alfredia Ferguson, PA-C sent at 07/04/2018  8:57 AM EDT ----- Regarding: labs, office f/u  Please put in for cbc and hepatic panel  For wed/thursday this week -, and get her an office follow up with Amy or Dr Carlean Purl 2-3 weeks , notify her of both  Also pt requesting to change providers from [facility] to [place]- husband also sees Carlean Purl Can you put this in a note  To Dr Rolla Flatten that will be permanent part of chart. Thanks

## 2018-07-06 NOTE — Progress Notes (Signed)
Reviewed and agree with management plan.  Ericson Nafziger T. Sosaia Pittinger, MD FACG 

## 2018-07-06 NOTE — Telephone Encounter (Signed)
Dr. [facility] and Carlean Purl do you approve of the change?

## 2018-07-06 NOTE — Telephone Encounter (Signed)
Spoke with patient to schedule follow up appt. Patient states Dr. Carlean Purl did her procedure in the hospital and would like to continue her follow up with him. Also her husband sees Dr. Carlean Purl so she would like for them both to see the same physician. Dr. [facility] are you ok with the switch?

## 2018-07-06 NOTE — Telephone Encounter (Signed)
OK with me. CG would need to approve.

## 2018-07-06 NOTE — Telephone Encounter (Signed)
Message  Received: Today  Message Contents  Ladene Artist, MD  Marzella Schlein, CMA        She needs an office appt in the next few weeks. Follow up DU and elevated LFTs. She can see an APP if I don't have an appt.  Please set a recall EGD for 09/2018 to document ulcer healing.

## 2018-07-07 ENCOUNTER — Encounter: Payer: Self-pay | Admitting: Family Medicine

## 2018-07-07 NOTE — Telephone Encounter (Signed)
OK with me.

## 2018-07-07 NOTE — Telephone Encounter (Signed)
Patient is scheduled for follow up on 07/16/18 10:30 with Amy Esterwood PA.

## 2018-07-07 NOTE — Progress Notes (Signed)
My Chart message re: benign ulcer results She has f/u pending

## 2018-07-07 NOTE — Telephone Encounter (Signed)
OK 

## 2018-07-09 ENCOUNTER — Other Ambulatory Visit (INDEPENDENT_AMBULATORY_CARE_PROVIDER_SITE_OTHER): Payer: BLUE CROSS/BLUE SHIELD

## 2018-07-09 DIAGNOSIS — K264 Chronic or unspecified duodenal ulcer with hemorrhage: Secondary | ICD-10-CM

## 2018-07-09 LAB — CBC
HCT: 28 % — ABNORMAL LOW (ref 36.0–46.0)
HEMOGLOBIN: 9.6 g/dL — AB (ref 12.0–15.0)
MCHC: 34.3 g/dL (ref 30.0–36.0)
MCV: 89.9 fl (ref 78.0–100.0)
Platelets: 270 10*3/uL (ref 150.0–400.0)
RBC: 3.11 Mil/uL — ABNORMAL LOW (ref 3.87–5.11)
RDW: 18.7 % — AB (ref 11.5–15.5)
WBC: 4.5 10*3/uL (ref 4.0–10.5)

## 2018-07-09 LAB — HEPATIC FUNCTION PANEL
ALT: 17 U/L (ref 0–35)
AST: 14 U/L (ref 0–37)
Albumin: 4.2 g/dL (ref 3.5–5.2)
Alkaline Phosphatase: 51 U/L (ref 39–117)
BILIRUBIN DIRECT: 0 mg/dL (ref 0.0–0.3)
BILIRUBIN TOTAL: 0.3 mg/dL (ref 0.2–1.2)
Total Protein: 6.6 g/dL (ref 6.0–8.3)

## 2018-07-13 LAB — HM DEXA SCAN

## 2018-07-14 NOTE — Progress Notes (Signed)
Chief Complaint  Patient presents with  . Hospitalization Follow-up    patient is feeling better. Would like to know if you will write rx for her lamictal for 3 months worth since she met her deductible. Also has a question about price fixing for doctor services and third party mediation.     Patient presents for hospital f/u. She was hospitalized 9/19-9/21 after EGD showed oozing, cratered duodenal ulcer. She was placed on protonix 47m BID. Pathology from ulcer biopsy was benign.  Prior to the bleeding ulcer she had been somewhat inconsistent with omeprazole (for a short while switched to zantac, then back to omeprazole), but had not been taking any NSAIDs or other known trigger/cause.  She also had episode of abdominal pain with elevated LFT's, lipase. The black stools started about a day prior to this pain.  Pain was short-lived, resolved by the time she got to the ER.  No further pain since then. Gallbladder sludge noted. She reports that GI doctors think everything (pain, abnormal labs) was related to the ulcer. Denies any further work-up planned at this point. She will be having a f/u EGD in the next 1-2 months.  She had f/u labs done 9/26 as ordered by GI.  Hg was stable, and LFT's were normal  Lab Results  Component Value Date   WBC 4.5 07/09/2018   HGB 9.6 (L) 07/09/2018   HCT 28.0 (L) 07/09/2018   MCV 89.9 07/09/2018   PLT 270.0 07/09/2018   Lab Results  Component Value Date   ALT 17 07/09/2018   AST 14 07/09/2018   ALKPHOS 51 07/09/2018   BILITOT 0.3 07/09/2018   She is asking for 90d rx for lamictal. [place] but now that she has hit deductible, would like local rx.  She has been stable on meds, moods good.  PMH, PSH, SH reviewed  Outpatient Encounter Medications as of 07/16/2018  Medication Sig  . Cholecalciferol (VITAMIN D) 2000 UNITS tablet Take 2,000 Units by mouth daily.  . fluticasone (FLONASE) 50 MCG/ACT nasal spray Place 1  spray into both nostrils daily as needed for allergies.   .Marland KitchenLAMICTAL 200 MG tablet Take 0.5 tablets (100 mg total) by mouth daily.  . LUTEIN-ZEAXANTHIN PO Take 1 tablet by mouth daily.  . magnesium 30 MG tablet Take 30 mg by mouth daily.  . pantoprazole (PROTONIX) 40 MG tablet Take 1 tablet (40 mg total) by mouth 2 (two) times daily.  . Probiotic Product (PROBIOTIC PO) Take 1 tablet by mouth 2 (two) times daily.  . Zoledronic Acid (ZOMETA IV) Inject into the vein every 6 (six) months.  . [DISCONTINUED] LAMICTAL 200 MG tablet Take 0.5 tablets (100 mg total) by mouth daily.  . ondansetron (ZOFRAN ODT) 4 MG disintegrating tablet Take 1 tablet (4 mg total) by mouth every 8 (eight) hours as needed for nausea. (Patient not taking: Reported on 07/16/2018)  . promethazine (PHENERGAN) 25 MG tablet Take 1 tablet (25 mg total) by mouth every 6 (six) hours as needed for nausea or vomiting. (Patient not taking: Reported on 07/16/2018)  . [DISCONTINUED] pantoprazole (PROTONIX) 40 MG tablet Take 1 tablet (40 mg total) by mouth 2 (two) times daily before a meal.   No facility-administered encounter medications on file as of 07/16/2018.    Allergies  Allergen Reactions  . Clindamycin/Lincomycin Diarrhea    Resulted in C-diff  . Nsaids Other (See Comments)    History of bleeding ulcers   . Other Nausea Only  Stronger pain meds  . Adhesive [Tape] Rash    Pt is sensitive to any kind of adhesive tape products.   ROS: no fever, chills, URI symptoms, headaches, dizziness, chest pain. No nausea, vomiting, abdominal pain, melena, hematochezia, urinary complaints, mood changes, fatigue, or other concerns.   PHYSICAL EXAM:  BP 100/70   Pulse 80   Ht 5' 7" (1.702 m)   Wt 148 lb 3.2 oz (67.2 kg)   BMI 23.21 kg/m  Wt Readings from Last 3 Encounters:  07/16/18 148 lb 3.2 oz (67.2 kg)  07/16/18 147 lb 8 oz (66.9 kg)  07/03/18 144 lb 14.4 oz (65.7 kg)   Well-appearing, chatty female in good spirits HEENT  conjunctiva and sclera are clear, EOMI, OP clear Neck: no lymphadenopathy or mass Heart: regular rate and rhythm Lungs: clear bilaterally Abdomen: soft, nontender, no organomegaly or mass Extremities: no edema, normal pulses Psych: normal mood, affect, hygiene and grooming Neuro: alert and oriented, cranial nerves intact, normal gait.   ASSESSMENT/PLAN:  Duodenal ulcer with hemorrhage - No further melena; Hct stable, abdomen nontender, compliant with BID PPI, continue. f/u with GI as planned  Bipolar disorder in full remission, most recent episode unspecified type ([place]) - Plan: LAMICTAL 200 MG tablet

## 2018-07-15 NOTE — Patient Instructions (Addendum)
Continue to take your Protonix twice daily    Peptic Ulcer A peptic ulcer is a sore in the lining of the esophagus (esophageal ulcer), the stomach (gastric ulcer), or the first part of the small intestine (duodenal ulcer). The ulcer causes gradual wearing away (erosion) into the deeper tissue. What are the causes? Normally, the lining of the stomach and the small intestine protects itself from the acid that digests food. The protective lining can be damaged by:  An infection caused by a germ (bacterium) called Helicobacter pylori or H. pylori.  Regular use of NSAIDs, such as ibuprofen or aspirin.  Rare tumors in the stomach, small intestine, or pancreas (Zollinger-Ellison syndrome).  What increases the risk? The following factors may make you more likely to develop this condition:  Smoking.  Having a family history of ulcer disease.  What are the signs or symptoms? Symptoms of this condition include:  Burning pain or gnawing in the area between the chest and the belly button. The pain may be worse on an empty stomach and at night.  Heartburn.  Nausea and vomiting.  Bloating.  If the ulcer results in bleeding, it can cause:  Black, tarry stools.  Vomiting of bright red blood.  Vomiting of material that looks like coffee grounds.  How is this diagnosed? This condition may be diagnosed based on:  Medical history and physical exam.  Various tests or procedures, such as: ? Blood tests, stool tests, or breath tests to check for the H. pylori bacterium. ? An X-ray exam (upper gastrointestinal series) of the esophagus, stomach, and small intestine. ? Upper endoscopy. The health care provider examines the esophagus, stomach, and small intestine using a small flexible tube that has a video camera at the end. ? Biopsy. A tissue sample is removed to be examined under a microscope.  How is this treated? Treatment for this condition may include:  Eliminating the cause of the  ulcer, such as smoking or the use of NSAIDs or alcohol.  Medicines to reduce the amount of acid in your digestive tract.  Antibiotic medicines, if the ulcer is caused by the H. pylori bacterium.  An upper endoscopy to treat a bleeding ulcer.  Surgery, if the bleeding is severe or if the ulcer created a hole somewhere in the digestive system.  Follow these instructions at home:  Avoid alcohol and caffeine.  Do not use any tobacco products, such as cigarettes, chewing tobacco, and e-cigarettes. If you need help quitting, ask your health care provider.  Take over-the-counter and prescription medicines only as told by your health care provider. Do not use over-the-counter medicines in place of prescription medicines unless your health care provider approves.  Keep all follow-up visits as told by your health care provider. This is important. Contact a health care provider if:  Your symptoms do not improve within 7 days of starting treatment.  You have ongoing indigestion or heartburn. Get help right away if:  You have sudden, sharp, or persistent pain in your abdomen.  You have bloody or dark black, tarry stools.  You vomit blood or material that looks like coffee grounds.  You become light-headed or you feel faint.  You become weak.  You become sweaty or clammy. This information is not intended to replace advice given to you by your health care provider. Make sure you discuss any questions you have with your health care provider. Document Released: 09/27/2000 Document Revised: 03/04/2016 Document Reviewed: 07/01/2015 Elsevier Interactive Patient Education  [employer].

## 2018-07-16 ENCOUNTER — Ambulatory Visit (INDEPENDENT_AMBULATORY_CARE_PROVIDER_SITE_OTHER): Payer: BLUE CROSS/BLUE SHIELD | Admitting: Family Medicine

## 2018-07-16 ENCOUNTER — Encounter: Payer: Self-pay | Admitting: Family Medicine

## 2018-07-16 ENCOUNTER — Encounter: Payer: Self-pay | Admitting: Physician Assistant

## 2018-07-16 ENCOUNTER — Ambulatory Visit (INDEPENDENT_AMBULATORY_CARE_PROVIDER_SITE_OTHER): Payer: BLUE CROSS/BLUE SHIELD | Admitting: Physician Assistant

## 2018-07-16 VITALS — BP 116/76 | HR 70 | Ht 67.0 in | Wt 147.5 lb

## 2018-07-16 VITALS — BP 100/70 | HR 80 | Ht 67.0 in | Wt 148.2 lb

## 2018-07-16 DIAGNOSIS — R945 Abnormal results of liver function studies: Secondary | ICD-10-CM

## 2018-07-16 DIAGNOSIS — K269 Duodenal ulcer, unspecified as acute or chronic, without hemorrhage or perforation: Secondary | ICD-10-CM

## 2018-07-16 DIAGNOSIS — F3170 Bipolar disorder, currently in remission, most recent episode unspecified: Secondary | ICD-10-CM | POA: Diagnosis not present

## 2018-07-16 DIAGNOSIS — K922 Gastrointestinal hemorrhage, unspecified: Secondary | ICD-10-CM | POA: Diagnosis not present

## 2018-07-16 DIAGNOSIS — R7989 Other specified abnormal findings of blood chemistry: Secondary | ICD-10-CM

## 2018-07-16 DIAGNOSIS — K264 Chronic or unspecified duodenal ulcer with hemorrhage: Secondary | ICD-10-CM | POA: Diagnosis not present

## 2018-07-16 DIAGNOSIS — D649 Anemia, unspecified: Secondary | ICD-10-CM | POA: Diagnosis not present

## 2018-07-16 MED ORDER — LAMICTAL 200 MG PO TABS: 100.0000 mg | ORAL_TABLET | Freq: Every day | ORAL | 0 refills | Status: DC

## 2018-07-16 MED ORDER — PANTOPRAZOLE SODIUM 40 MG PO TBEC: 40.0000 mg | DELAYED_RELEASE_TABLET | Freq: Two times a day (BID) | ORAL | 6 refills | Status: DC

## 2018-07-16 NOTE — Patient Instructions (Addendum)
Your provider has requested that you go to the basement level for lab work on Friday  08-14-2018 .   We sent refills for the Pantoprazole Sodium ( Protonix) 40 mg to [facility], [address]   You have been scheduled for an endoscopy. Please follow written instructions given to you at your visit today. If you use inhalers (even only as needed), please bring them with you on the day of your procedure.  Normal BMI (Body Mass Index- based on height and weight) is between 19 and 25. Your BMI today is Body mass index is 23.1 kg/m. Marland Kitchen Please consider follow up  regarding your BMI with your Primary Care Provider.

## 2018-07-16 NOTE — Progress Notes (Signed)
Subjective:    Patient ID: Rhonda Gardner, female    DOB: 1956-01-16, 62 y.o.   MRN: [id number]  HPI Rhonda Gardner is a pleasant 62 year old white female who comes in today for post hospital follow-up after recent admission with GI bleed.  Patient had previously been established with Dr. Fuller Plan but would like to switch providers to Dr. Arelia Longest who she saw in the hospital. She was admitted 9/19 through 07/04/2018 after she had an EGD in the [facility] per Dr. Rachael Darby done acutely for complaints of melena.  She was found to have a large cratered duodenal ulcer taking up about two thirds of the circumference of the duodenal wall and periampullary positioning.  She was oozing at the time of procedure and decision was made to admit her. Hemoglobin was 10.3 on 916, down to 9.1 on 918. She was also noted to have a bump in her LFTs. Abdominal ultrasound outpatient had shown gallbladder sludge and a common bile duct of 2.9 mm.  Exline She had repeat EGD during hospitalization with Dr. Arelia Longest again with finding of one large oozing cratered duodenal ulcer the area of the papilla.  There was some oozing but no visible vessel.  Biopsy was taken and this showed benign mall bowel mucosa with lymphoid hyperplasia, no malignancy. Patient came in for repeat labs last week hemoglobin up to 9.6 hematocrit of 28 and LFTs have returned to normal. She is  on twice daily Protonix.  Patient says she feels well, she has not had any melena or hematochezia She is been eating well has no complaints of nausea or abdominal discomfort.  She asked about resuming iron which she had previously been on. Medical issues include history of C. difficile, chemo induced neuropathy, history of breast cancer 2-way 2016 and bipolar disorder.  Patient has been on Zometa.  Patient has history of duodenal ulcer with bleed also in October 2018.  At that time this was due to NSAID use.  She did take PPI therapy for a few months and then stopped, went  on ranitidine for a while and then had resumed once daily PPI prior to this most recent admission.   Review of Systems Pertinent positive and negative review of systems were noted in the above HPI section.  All other review of systems was otherwise negative.  Outpatient Encounter Medications as of 07/16/2018  Medication Sig  . Cholecalciferol (VITAMIN D) 2000 UNITS tablet Take 2,000 Units by mouth daily.  . fluticasone (FLONASE) 50 MCG/ACT nasal spray Place 1 spray into both nostrils daily as needed for allergies.   . LUTEIN-ZEAXANTHIN PO Take 1 tablet by mouth daily.  . magnesium 30 MG tablet Take 30 mg by mouth daily.  . ondansetron (ZOFRAN ODT) 4 MG disintegrating tablet Take 1 tablet (4 mg total) by mouth every 8 (eight) hours as needed for nausea. (Patient not taking: Reported on 07/16/2018)  . pantoprazole (PROTONIX) 40 MG tablet Take 1 tablet (40 mg total) by mouth 2 (two) times daily.  . Probiotic Product (PROBIOTIC PO) Take 1 tablet by mouth 2 (two) times daily.  . promethazine (PHENERGAN) 25 MG tablet Take 1 tablet (25 mg total) by mouth every 6 (six) hours as needed for nausea or vomiting. (Patient not taking: Reported on 07/16/2018)  . Zoledronic Acid (ZOMETA IV) Inject into the vein every 6 (six) months.  . [DISCONTINUED] LAMICTAL 200 MG tablet Take 0.5 tablets (100 mg total) by mouth daily.  . [DISCONTINUED] pantoprazole (PROTONIX) 40 MG tablet Take 1 tablet (  40 mg total) by mouth 2 (two) times daily before a meal.   No facility-administered encounter medications on file as of 07/16/2018.    Allergies  Allergen Reactions  . Clindamycin/Lincomycin Diarrhea    Resulted in C-diff  . Nsaids Other (See Comments)    History of bleeding ulcers   . Other Nausea Only    Stronger pain meds  . Adhesive [Tape] Rash    Pt is sensitive to any kind of adhesive tape products.   Patient Active Problem List   Diagnosis Date Noted  . Upper gastrointestinal bleed 07/02/2018  . Abdominal  pain, epigastric 07/01/2018  . Elevated lipase 07/01/2018  . Elevated LFTs 07/01/2018  . Iron deficiency anemia 08/17/2017  . C. difficile colitis 08/17/2017  . Dehydration 08/17/2017  . Hypokalemia 08/17/2017  . Symptomatic anemia 07/16/2017  . Bipolar 1 disorder ([place]) 07/16/2017  . Tachycardia 07/16/2017  . Hypotension due to hypovolemia 07/16/2017  . Melena   . Heme positive stool   . Duodenal ulcer with hemorrhage   . Blood transfusion without reported diagnosis 07/14/2017  . Osteoporosis 07/29/2016  . Genetic counseling and testing 01/29/2016  . Pain in joint, pelvic region and thigh 06/12/2015  . Chemotherapy-induced neuropathy ([place]) 03/06/2015  . Facial flushing 01/25/2015  . Insomnia 01/25/2015  . Allergy to adhesive tape 01/25/2015  . Malignant neoplasm of upper-outer quadrant of left breast in female, estrogen receptor negative ([place]) 12/22/2014   Social History   Socioeconomic History  . Marital status: Married    Spouse name: Not on file  . Number of children: Not on file  . Years of education: Not on file  . Highest education level: Not on file  Occupational History  . Not on file  Social Needs  . Financial resource strain: Not on file  . Food insecurity:    Worry: Not on file    Inability: Not on file  . Transportation needs:    Medical: Not on file    Non-medical: Not on file  Tobacco Use  . Smoking status: Never Smoker  . Smokeless tobacco: Never Used  Substance and Sexual Activity  . Alcohol use: Yes    Alcohol/week: 0.0 standard drinks    Comment: 1-2 glasses of wine a month  . Drug use: No  . Sexual activity: Yes    Partners: Male    Comment: postmenopausal  Lifestyle  . Physical activity:    Days per week: Not on file    Minutes per session: Not on file  . Stress: Not on file  Relationships  . Social connections:    Talks on phone: Not on file    Gets together: Not on file    Attends religious service: Not on file    Active member of  club or organization: Not on file    Attends meetings of clubs or organizations: Not on file    Relationship status: Not on file  . Intimate partner violence:    Fear of current or ex partner: Not on file    Emotionally abused: Not on file    Physically abused: Not on file    Forced sexual activity: Not on file  Other Topics Concern  . Not on file  Social History Narrative   Married [name]), 1 dog (chocolate lab). Homemaker    [name]'s family history includes Arthritis in her mother; Autism in her sister; Blindness in her sister; Cancer (age of onset: 18) in her paternal aunt; Colon cancer (age of onset:  2) in her mother; Diabetes in her paternal aunt; Goiter in her paternal aunt; Heart disease in her father; Hypertension in her mother; Lung cancer in her maternal aunt; Macular degeneration in her mother; Mental retardation in her sister; Osteoporosis in her sister and sister; Skin cancer in her father.      Objective:    Vitals:   07/16/18 1044  BP: 116/76  Pulse: 70    Physical Exam; well-developed older white female in no acute distress, very pleasant accompanied by her husband blood pressure 116/76 pulse 70, height 5 foot 7, weight 147, BMI 23.1.  HEENT; nontraumatic normocephalic EOMI PERRLA sclera anicteric oral mucosa moist, Cardiovascular ;regular rate and rhythm with S1-S2 no murmur rub or gallop, Pulmonary; clear bilaterally, Abdomen; soft, nontender nondistended bowel sounds are active there is no palpable mass or hepatosplenomegaly, Rectal; exam not done, Extremities ;no clubbing cyanosis or edema skin warm dry, Neuro psych alert and oriented, grossly nonfocal mood and affect appropriate       Assessment & Plan:   #27 62 year old white female with recent hospitalization for acute GI bleed secondary to large cratered duodenal ulcer located very near the ampulla. Biopsies were taken and returned showing benign small bowel mucosa/no malignancy  Patient also has  history of a duodenal ulcer with bleeding in October 2018, felt NSAID induced at that time.  Patient is currently doing well, has no GI symptoms, hemoglobin is gradually improving.  #2 transient  elevation of LFTs which have since normalized.  Ultrasound was negative. Etiology of the elevated LFTs is not entirely clear though suspect this may have been related to the large ulcer with very close proximity to the ampulla  #3 history of breast cancer 11/21/2014 on Zometa 4.  Bipolar 5.  Chemo-induced neuropathy 6.  Prior history of C. Difficile  Plan; patient will continue Protonix 40 mg p.o. twice daily for at least 3 months.  At that time depending on her course would decrease to once daily long-term We will plan to repeat CBC iron studies, and hepatic panel in 1 month She can resume her oral iron Patient has been scheduled for follow-up EGD with Dr. Arelia Longest in November to document healing of ulcer.  Procedure was discussed in detail with the patient and her husband and she is agreeable to proceed.    During hospitalization patient had requested to change GI physicians from Dr. Fuller Plan to Dr. Carlean Purl.  Her husband is also a patient of Dr. Celesta Aver.  Ladamien Rammel S Rayen Dafoe PA-C 07/16/2018   Cc: Rita Ohara, MD

## 2018-07-17 NOTE — Progress Notes (Signed)
Reviewed and agree with management plan. She requested to transfer her ongoing GI care to Dr. Carlean Purl - forwarding note.   Rhonda Gardner. Fuller Plan, MD [facility]

## 2018-07-20 ENCOUNTER — Encounter: Payer: Self-pay | Admitting: *Deleted

## 2018-07-28 ENCOUNTER — Telehealth: Payer: Self-pay

## 2018-07-28 NOTE — Telephone Encounter (Signed)
Spoke with pt to inform of BD results from 07/13/18.  Pt voiced understanding and thanks. No questions or concerns at this time.

## 2018-07-31 ENCOUNTER — Other Ambulatory Visit: Payer: Self-pay

## 2018-07-31 DIAGNOSIS — R1013 Epigastric pain: Secondary | ICD-10-CM

## 2018-08-05 ENCOUNTER — Encounter: Payer: Self-pay | Admitting: Podiatry

## 2018-08-05 ENCOUNTER — Ambulatory Visit (INDEPENDENT_AMBULATORY_CARE_PROVIDER_SITE_OTHER): Payer: BLUE CROSS/BLUE SHIELD

## 2018-08-05 ENCOUNTER — Ambulatory Visit (INDEPENDENT_AMBULATORY_CARE_PROVIDER_SITE_OTHER): Payer: BLUE CROSS/BLUE SHIELD | Admitting: Podiatry

## 2018-08-05 DIAGNOSIS — M779 Enthesopathy, unspecified: Secondary | ICD-10-CM

## 2018-08-05 DIAGNOSIS — M7752 Other enthesopathy of left foot: Secondary | ICD-10-CM

## 2018-08-05 DIAGNOSIS — M7750 Other enthesopathy of unspecified foot: Secondary | ICD-10-CM

## 2018-08-05 DIAGNOSIS — M7751 Other enthesopathy of right foot: Secondary | ICD-10-CM

## 2018-08-05 MED ORDER — TRIAMCINOLONE ACETONIDE 10 MG/ML IJ SUSP
10.0000 mg | Freq: Once | INTRAMUSCULAR | Status: AC
  Administered 2018-08-05: 10 mg

## 2018-08-06 NOTE — Progress Notes (Signed)
Subjective:   Patient ID: Rhonda Gardner, female   DOB: 62 y.o.   MRN: [id number]   HPI Patient presents stating she is developed a lot of pain on the inside of the right ankle and does not remember injury and stated its been hurting for about 6 weeks   ROS      Objective:  Physical Exam  Neurovascular status intact with patient's right posterior tibial tendon found to be inflamed as it comes under the medial malleolus with no indications of tendon dysfunction     Assessment:  Appears to be posterior tibial tendinitis right     Plan:  X-ray taken reviewed and today I did a careful sheath injection right 3 mg Kenalog 5 mg Xylocaine and discussed supportive shoe gear usage.  If symptoms persist may require other treatment  X-ray indicates that there is no indications currently of depression of the arch and the first metatarsal has been well corrected

## 2018-08-12 ENCOUNTER — Inpatient Hospital Stay: Payer: BLUE CROSS/BLUE SHIELD

## 2018-08-12 ENCOUNTER — Inpatient Hospital Stay: Payer: BLUE CROSS/BLUE SHIELD | Attending: Oncology

## 2018-08-12 VITALS — BP 114/60 | HR 76 | Temp 98.5°F | Resp 16

## 2018-08-12 DIAGNOSIS — Z171 Estrogen receptor negative status [ER-]: Secondary | ICD-10-CM

## 2018-08-12 DIAGNOSIS — C50412 Malignant neoplasm of upper-outer quadrant of left female breast: Secondary | ICD-10-CM

## 2018-08-12 DIAGNOSIS — M818 Other osteoporosis without current pathological fracture: Secondary | ICD-10-CM

## 2018-08-12 DIAGNOSIS — M810 Age-related osteoporosis without current pathological fracture: Secondary | ICD-10-CM | POA: Insufficient documentation

## 2018-08-12 LAB — CBC WITH DIFFERENTIAL/PLATELET
Abs Immature Granulocytes: 0.01 10*3/uL (ref 0.00–0.07)
Basophils Absolute: 0.1 10*3/uL (ref 0.0–0.1)
Basophils Relative: 1 %
EOS ABS: 0.1 10*3/uL (ref 0.0–0.5)
EOS PCT: 2 %
HEMATOCRIT: 30.1 % — AB (ref 36.0–46.0)
HEMOGLOBIN: 9.4 g/dL — AB (ref 12.0–15.0)
IMMATURE GRANULOCYTES: 0 %
LYMPHS ABS: 0.7 10*3/uL (ref 0.7–4.0)
LYMPHS PCT: 13 %
MCH: 27.6 pg (ref 26.0–34.0)
MCHC: 31.2 g/dL (ref 30.0–36.0)
MCV: 88.3 fL (ref 80.0–100.0)
MONOS PCT: 10 %
Monocytes Absolute: 0.6 10*3/uL (ref 0.1–1.0)
Neutro Abs: 4 10*3/uL (ref 1.7–7.7)
Neutrophils Relative %: 74 %
Platelets: 266 10*3/uL (ref 150–400)
RBC: 3.41 MIL/uL — ABNORMAL LOW (ref 3.87–5.11)
RDW: 16.2 % — AB (ref 11.5–15.5)
WBC: 5.4 10*3/uL (ref 4.0–10.5)
nRBC: 0 % (ref 0.0–0.2)

## 2018-08-12 LAB — COMPREHENSIVE METABOLIC PANEL
ALBUMIN: 4.1 g/dL (ref 3.5–5.0)
ALK PHOS: 60 U/L (ref 38–126)
ALT: 16 U/L (ref 0–44)
AST: 20 U/L (ref 15–41)
Anion gap: 9 (ref 5–15)
BUN: 14 mg/dL (ref 8–23)
CALCIUM: 9.1 mg/dL (ref 8.9–10.3)
CHLORIDE: 107 mmol/L (ref 98–111)
CO2: 26 mmol/L (ref 22–32)
CREATININE: 0.85 mg/dL (ref 0.44–1.00)
GFR calc Af Amer: >60 mL/min (ref >60)
GFR calc non Af Amer: >60 mL/min (ref >60)
GLUCOSE: 85 mg/dL (ref 70–99)
Potassium: 3.8 mmol/L (ref 3.5–5.1)
SODIUM: 142 mmol/L (ref 135–145)
Total Bilirubin: 0.3 mg/dL (ref 0.3–1.2)
Total Protein: 7 g/dL (ref 6.5–8.1)

## 2018-08-12 MED ORDER — ZOLEDRONIC ACID 4 MG/100ML IV SOLN
4.0000 mg | Freq: Once | INTRAVENOUS | Status: AC
  Administered 2018-08-12: 4 mg via INTRAVENOUS
  Filled 2018-08-12: qty 100

## 2018-08-12 MED ORDER — SODIUM CHLORIDE 0.9 % IV SOLN
Freq: Once | INTRAVENOUS | Status: AC
  Administered 2018-08-12: 15:00:00 via INTRAVENOUS
  Filled 2018-08-12: qty 250

## 2018-08-12 NOTE — Patient Instructions (Signed)

## 2018-08-13 ENCOUNTER — Encounter: Payer: Self-pay | Admitting: Family Medicine

## 2018-08-14 ENCOUNTER — Other Ambulatory Visit (INDEPENDENT_AMBULATORY_CARE_PROVIDER_SITE_OTHER): Payer: BLUE CROSS/BLUE SHIELD

## 2018-08-14 DIAGNOSIS — K269 Duodenal ulcer, unspecified as acute or chronic, without hemorrhage or perforation: Secondary | ICD-10-CM | POA: Diagnosis not present

## 2018-08-14 DIAGNOSIS — D649 Anemia, unspecified: Secondary | ICD-10-CM

## 2018-08-14 DIAGNOSIS — R945 Abnormal results of liver function studies: Secondary | ICD-10-CM

## 2018-08-14 DIAGNOSIS — K922 Gastrointestinal hemorrhage, unspecified: Secondary | ICD-10-CM

## 2018-08-14 DIAGNOSIS — R7989 Other specified abnormal findings of blood chemistry: Secondary | ICD-10-CM

## 2018-08-14 LAB — IBC PANEL
Iron: 8 ug/dL — ABNORMAL LOW (ref 42–145)
SATURATION RATIOS: 1.4 % — AB (ref 20.0–50.0)
Transferrin: 414 mg/dL — ABNORMAL HIGH (ref 212.0–360.0)

## 2018-08-14 LAB — HEPATIC FUNCTION PANEL
ALBUMIN: 4.5 g/dL (ref 3.5–5.2)
ALK PHOS: 58 U/L (ref 39–117)
ALT: 13 U/L (ref 0–35)
AST: 16 U/L (ref 0–37)
Bilirubin, Direct: 0.1 mg/dL (ref 0.0–0.3)
TOTAL PROTEIN: 6.8 g/dL (ref 6.0–8.3)
Total Bilirubin: 0.3 mg/dL (ref 0.2–1.2)

## 2018-08-17 LAB — GASTRIN: GASTRIN: 183 pg/mL — AB (ref <=100)

## 2018-08-26 ENCOUNTER — Other Ambulatory Visit: Payer: Self-pay

## 2018-08-26 DIAGNOSIS — K269 Duodenal ulcer, unspecified as acute or chronic, without hemorrhage or perforation: Secondary | ICD-10-CM

## 2018-08-26 NOTE — Progress Notes (Signed)
I think it will be best that I do her procedure at the hospital - needed a duodenoscope to do last EGD because ulcer is on medial wall of duodenum so we need to cancel in [place] and I need to find a spot for hospital which may take a while  OK?

## 2018-08-27 NOTE — Telephone Encounter (Signed)
Pt called back states the date and time works perfectly for her.

## 2018-09-01 NOTE — Anesthesia Preprocedure Evaluation (Signed)
Anesthesia Evaluation  Patient identified by MRN, date of birth, ID band Patient awake    Reviewed: Allergy & Precautions, H&P , NPO status , Patient's Chart, lab work & pertinent test results, reviewed documented beta blocker date and time   History of Anesthesia Complications (+) PONV and history of anesthetic complications  Airway Mallampati: II  TM Distance: >3 FB Neck ROM: full    Dental no notable dental hx.    Pulmonary neg pulmonary ROS,    Pulmonary exam normal breath sounds clear to auscultation       Cardiovascular Exercise Tolerance: Good negative cardio ROS   Rhythm:regular Rate:Normal  ECHO 16  - Left ventricle: The cavity size was normal. Systolic function was   vigorous. The estimated ejection fraction was in the range of 65%   to 70%. Wall motion was normal; there were no regional wall   motion abnormalities. Left ventricular diastolic function   parameters were normal. - Left atrium: The atrium was mildly dilated. - Right ventricle: The cavity size was normal. Wall thickness was   normal. Systolic function was normal. - Atrial septum: No defect or patent foramen ovale was identified. - Tricuspid valve: There was moderate regurgitation. - Pulmonary arteries: Systolic pressure was moderately to severely   increased. PA peak pressure: 54 mm Hg (S). - Global longitudinal strain -24.1%.   Neuro/Psych PSYCHIATRIC DISORDERS Depression Bipolar Disorder  Neuromuscular disease negative neurological ROS     GI/Hepatic Neg liver ROS, PUD, Heme positive stool   Endo/Other  negative endocrine ROS  Renal/GU negative Renal ROS     Musculoskeletal   Abdominal   Peds  Hematology  (+) anemia ,   Anesthesia Other Findings   Reproductive/Obstetrics Breast CA                             Anesthesia Physical  Anesthesia Plan  ASA: III  Anesthesia Plan: MAC   Post-op Pain  Management:    Induction: Intravenous  PONV Risk Score and Plan: 3 and Propofol infusion and Treatment may vary due to age or medical condition  Airway Management Planned: Simple Face Mask and Nasal Cannula  Additional Equipment:   Intra-op Plan:   Post-operative Plan:   Informed Consent: I have reviewed the patients History and Physical, chart, labs and discussed the procedure including the risks, benefits and alternatives for the proposed anesthesia with the patient or authorized representative who has indicated his/her understanding and acceptance.   Dental Advisory Given  Plan Discussed with: CRNA  Anesthesia Plan Comments:         Anesthesia Quick Evaluation

## 2018-09-02 ENCOUNTER — Encounter (HOSPITAL_COMMUNITY)
Admission: RE | Disposition: A | Discharge status: Home / Self Care | Payer: Self-pay | Source: Ambulatory Visit | Attending: Internal Medicine

## 2018-09-02 ENCOUNTER — Ambulatory Visit (HOSPITAL_COMMUNITY): Payer: BLUE CROSS/BLUE SHIELD | Admitting: Anesthesiology

## 2018-09-02 ENCOUNTER — Other Ambulatory Visit: Payer: Self-pay

## 2018-09-02 ENCOUNTER — Encounter (HOSPITAL_COMMUNITY): Payer: Self-pay | Admitting: *Deleted

## 2018-09-02 ENCOUNTER — Ambulatory Visit (HOSPITAL_COMMUNITY)
Admission: RE | Admit: 2018-09-02 | Discharge: 2018-09-02 | Disposition: A | Discharge status: Home / Self Care | Payer: BLUE CROSS/BLUE SHIELD | Source: Ambulatory Visit | Attending: Internal Medicine | Admitting: Internal Medicine

## 2018-09-02 DIAGNOSIS — Z79899 Other long term (current) drug therapy: Secondary | ICD-10-CM | POA: Insufficient documentation

## 2018-09-02 DIAGNOSIS — K3189 Other diseases of stomach and duodenum: Secondary | ICD-10-CM | POA: Diagnosis not present

## 2018-09-02 DIAGNOSIS — Z09 Encounter for follow-up examination after completed treatment for conditions other than malignant neoplasm: Secondary | ICD-10-CM | POA: Insufficient documentation

## 2018-09-02 DIAGNOSIS — F319 Bipolar disorder, unspecified: Secondary | ICD-10-CM | POA: Insufficient documentation

## 2018-09-02 DIAGNOSIS — K264 Chronic or unspecified duodenal ulcer with hemorrhage: Secondary | ICD-10-CM

## 2018-09-02 DIAGNOSIS — D649 Anemia, unspecified: Secondary | ICD-10-CM | POA: Insufficient documentation

## 2018-09-02 DIAGNOSIS — Z853 Personal history of malignant neoplasm of breast: Secondary | ICD-10-CM | POA: Insufficient documentation

## 2018-09-02 DIAGNOSIS — E611 Iron deficiency: Secondary | ICD-10-CM | POA: Diagnosis not present

## 2018-09-02 HISTORY — PX: BIOPSY: SHX5522

## 2018-09-02 HISTORY — PX: ESOPHAGOGASTRODUODENOSCOPY (EGD) WITH PROPOFOL: SHX5813

## 2018-09-02 SURGERY — ESOPHAGOGASTRODUODENOSCOPY (EGD) WITH PROPOFOL
Anesthesia: Monitor Anesthesia Care

## 2018-09-02 MED ORDER — LIDOCAINE 2% (20 MG/ML) 5 ML SYRINGE
INTRAMUSCULAR | Status: DC | PRN
  Administered 2018-09-02: 100 mg via INTRAVENOUS

## 2018-09-02 MED ORDER — PROPOFOL 10 MG/ML IV BOLUS
INTRAVENOUS | Status: DC | PRN
  Administered 2018-09-02 (×2): 20 mg via INTRAVENOUS
  Administered 2018-09-02: 40 mg via INTRAVENOUS
  Administered 2018-09-02 (×2): 20 mg via INTRAVENOUS

## 2018-09-02 MED ORDER — LACTATED RINGERS IV SOLN
INTRAVENOUS | Status: DC
  Administered 2018-09-02: 1000 mL via INTRAVENOUS

## 2018-09-02 MED ORDER — FERROUS SULFATE 325 (65 FE) MG PO TBEC: 325.0000 mg | DELAYED_RELEASE_TABLET | Freq: Two times a day (BID) | ORAL | 3 refills | Status: DC

## 2018-09-02 MED ORDER — PROPOFOL 10 MG/ML IV BOLUS
INTRAVENOUS | Status: AC
  Filled 2018-09-02: qty 60

## 2018-09-02 MED ORDER — LACTATED RINGERS IV SOLN: INTRAVENOUS | Status: DC

## 2018-09-02 MED ORDER — PROPOFOL 500 MG/50ML IV EMUL
INTRAVENOUS | Status: DC | PRN
  Administered 2018-09-02: 100 ug/kg/min via INTRAVENOUS

## 2018-09-02 SURGICAL SUPPLY — 14 items

## 2018-09-02 NOTE — Discharge Instructions (Signed)
° °  Things look better but there was slight ooze of blood in the area from the scope touching it.  I do not suspect cancer.  I did take biopsies again. This remains an unusual situation.   See the endoscopy report for how I think you got here.  For now - stay on pantoprazole. Purchase over the counter ferrous sulfate and take it 2 x a day with food. Do not be alarmed if stools look black - like coal.  I will call with results and plans. I don't think we are done yet as do not understand the cause. See report for my theories.  I appreciate the opportunity to care for you. Gatha Mayer, MD, FACG  YOU HAD AN ENDOSCOPIC PROCEDURE TODAY: Refer to the procedure report and other information in the discharge instructions given to you for any specific questions about what was found during the examination. If this information does not answer your questions, please call Dr. Celesta Aver office at [phone] to clarify.   YOU SHOULD EXPECT: Some feelings of bloating in the abdomen. Passage of more gas than usual. Walking can help get rid of the air that was put into your GI tract during the procedure and reduce the bloating. If you had a lower endoscopy (such as a colonoscopy or flexible sigmoidoscopy) you may notice spotting of blood in your stool or on the toilet paper. Some abdominal soreness may be present for a day or two, also.  DIET: Your first meal following the procedure should be a light meal and then it is ok to progress to your normal diet. A half-sandwich or bowl of soup is an example of a good first meal. Heavy or fried foods are harder to digest and may make you feel nauseous or bloated. Drink plenty of fluids but you should avoid alcoholic beverages for 24 hours.   ACTIVITY: Your care partner should take you home directly after the procedure. You should plan to take it easy, moving slowly for the rest of the day. You can resume normal activity the day after the procedure however  YOU SHOULD NOT DRIVE, use power tools, machinery or perform tasks that involve climbing or major physical exertion for 24 hours (because of the sedation medicines used during the test).   SYMPTOMS TO REPORT IMMEDIATELY: A gastroenterologist can be reached at any hour. Please call [phone]  for any of the following symptoms:   Following upper endoscopy (EGD, EUS, ERCP, esophageal dilation) Vomiting of blood or coffee ground material  New, significant abdominal pain  New, significant chest pain or pain under the shoulder blades  Painful or persistently difficult swallowing  New shortness of breath  Black, tarry-looking or red, bloody stools  FOLLOW UP:  If any biopsies were taken you will be contacted by phone or by letter within the next 1-3 weeks. Call [phone]  if you have not heard about the biopsies in 3 weeks.  Please also call with any specific questions about appointments or follow up tests.

## 2018-09-02 NOTE — Op Note (Addendum)
[facility] Patient Name: Rhonda Gardner Procedure Date: 09/02/2018 MRN: [id number] Attending MD: Gatha Mayer , MD Date of Birth: January 02, 1956 CSN: [id number] Age: 62 Admit Type: Outpatient Procedure:                Upper GI endoscopy Indications:              Chronic duodenal ulcer with hemorrhage, Follow-up                            of chronic duodenal ulcer with hemorrhage Providers:                Gatha Mayer, MD, Carlyn Reichert, RN, Cherylynn Ridges, Technician, Cathe Mons, CRNA Referring MD:              Medicines:                Propofol per Anesthesia, Monitored Anesthesia Care Complications:            No immediate complications. Estimated Blood Loss:     Estimated blood loss was minimal. Procedure:                Pre-Anesthesia Assessment:                           - Prior to the procedure, a History and Physical                            was performed, and patient medications and                            allergies were reviewed. [name] tolerance of                            previous anesthesia was also reviewed. The risks                            and benefits of the procedure and the sedation                            options and risks were discussed with the patient.                            All questions were answered, and informed consent                            was obtained. Prior Anticoagulants: The patient has                            taken no previous anticoagulant or antiplatelet                            agents. ASA Grade Assessment: II - A patient with  mild systemic disease. After reviewing the risks                            and benefits, the patient was deemed in                            satisfactory condition to undergo the procedure.                           After obtaining informed consent, the endoscope was                            passed under  direct vision. Throughout the                            procedure, the patient's blood pressure, pulse, and                            oxygen saturations were monitored continuously. The                            [id number]-Q180V ([id number]) Olympus ERCP was introduced                            through the mouth, and advanced to the second part                            of duodenum. The upper GI endoscopy was                            accomplished without difficulty. The patient                            tolerated the procedure well. Scope In: Scope Out: Findings:      One oozing superficial duodenal ulcer was found in the area of the       papilla. Biopsies were taken with a cold forceps for histology.       Verification of patient identification for the specimen was done.       Estimated blood loss was minimal.      The exam was otherwise without abnormality.      The cardia and gastric fundus were normal on retroflexion. Impression:               - One oozing duodenal ulcer. Biopsied. I think it                            is mostly healed if not completely but the papilla                            looks "scalded" and there was ooze of blood at                            junction of papilla structure and duodenum after  scope pressure/contact. Nothing that looks                            malignant. Biopsies taken.                           - The examination was otherwise normal. Esophagus                            not seen well with duodenoscope.It was seen well                            and ok in Spetember 2019 at EGD                           Here is what I think has transpired since last year:                           1) 07/2017 duodenal and periampullary ulcer -                            hemorrhage - from NSAID's.                           2) Ulcer treated with EPI and cautery, and PPI                           3) Had C diff 2x)subsequently and PPI  stopped, took                            H2 blocker but rec omeprazole and was on med list                            when she had problems again this year (20 mg)                           4) No iron Tx that I can see,                           5) Ulcer problems again this year with bleeding and                            periampullary edema causing biliary signs/sxs                           6) Remains anemic and iron deficient Moderate Sedation:      Not Applicable - Patient had care per Anesthesia. Recommendation:           - Patient has a contact number available for                            emergencies. The signs and symptoms of potential  delayed complications were discussed with the                            patient. Return to normal activities tomorrow.                            Written discharge instructions were provided to the                            patient.                           - Resume previous diet.                           - Continue present medications. Stay on bid PPI                           - Await pathology results. Cause of persistent                            problems unclear and unusual. ? if the appropriate                            ulcer treatment has caused scarring around the                            ampulla. ? some other infiltrative process - does                            not seem likely. Could need an EUS to help                            understand better. Cross sectional imaging ok so                            far. She has held her Hgb on PPI for months now.                           - Ferrous sulfate at 325 mg orally BID.                           Will cc: Dr. Rita Ohara Procedure Code(s):        --- Professional ---                           [phone], Esophagogastroduodenoscopy, flexible,                            transoral; with biopsy, single or multiple Diagnosis Code(s):        --- Professional ---                            K26.4, Chronic or unspecified duodenal ulcer with  hemorrhage CPT copyright 2018 American Medical Association. All rights reserved. The codes documented in this report are preliminary and upon coder review may  be revised to meet current compliance requirements. Gatha Mayer, MD 09/02/2018 9:00:29 AM This report has been signed electronically. Number of Addenda: 0

## 2018-09-02 NOTE — Transfer of Care (Signed)
Immediate Anesthesia Transfer of Care Note  Patient: Rhonda Gardner  Procedure(s) Performed: ESOPHAGOGASTRODUODENOSCOPY (EGD) WITH PROPOFOL (N/A ) BIOPSY  Patient Location: Endoscopy Unit  Anesthesia Type:MAC  Level of Consciousness: drowsy and patient cooperative  Airway & Oxygen Therapy: Patient Spontanous Breathing and Patient connected to nasal cannula oxygen  Post-op Assessment: Report given to RN and Post -op Vital signs reviewed and stable  Post vital signs: Reviewed and stable  Last Vitals:  Vitals Value Taken Time  BP 92/48 09/02/2018  8:45 AM  Temp    Pulse 78 09/02/2018  8:46 AM  Resp 15 09/02/2018  8:46 AM  SpO2 100 % 09/02/2018  8:46 AM  Vitals shown include unvalidated device data.  Last Pain:  Vitals:   09/02/18 0755  TempSrc: Oral  PainSc: 0-No pain         Complications: No apparent anesthesia complications

## 2018-09-02 NOTE — Anesthesia Postprocedure Evaluation (Signed)
Anesthesia Post Note  Patient: Rhonda Gardner  Procedure(s) Performed: ESOPHAGOGASTRODUODENOSCOPY (EGD) WITH PROPOFOL (N/A ) BIOPSY     Patient location during evaluation: PACU Anesthesia Type: MAC Level of consciousness: awake and alert Pain management: pain level controlled Vital Signs Assessment: post-procedure vital signs reviewed and stable Respiratory status: spontaneous breathing Cardiovascular status: stable Anesthetic complications: no    Last Vitals:  Vitals:   09/02/18 0850 09/02/18 0900  BP: (!) 107/46 107/64  Pulse: 82 68  Resp: 19 17  Temp:    SpO2: 100% 99%    Last Pain:  Vitals:   09/02/18 0900  TempSrc:   PainSc: 0-No pain                 Nolon Nations

## 2018-09-02 NOTE — H&P (Signed)
[facility] History and Physical   Primary Care Physician:  Rita Ohara, MD   Reason for Procedure:   Follow-up duodenal ulcer  Plan:    EGD The risks and benefits as well as alternatives of endoscopic procedure(s) have been discussed and reviewed. All questions answered. The patient agrees to proceed.      HPI: Rhonda Gardner is a 62 y.o. female with hx of duodenal ulcer around the major papilla here for f/u exam after treatment with PPI. She remaions anemic. Has not been on iron supplements yet.   Past Medical History:  Diagnosis Date  . Allergy 2006   chemical cauterization in spring 2006, and 2nd treatment with good results.(Dr.Shoemaker)  . Bipolar disorder ([place])   . Bleeding ulcer   . Blood transfusion without reported diagnosis 07/2017   Bleeding ulcer  . Breast cancer ([place]) 12/2014   invasive ductal carcinoma (LEFT)  . Breast cancer of upper-outer quadrant of left female breast ([place]) 12/22/2014  . Breast hematoma 12/19/2015   had left breast aspiration of 4cm hematoma-path indicates benign hematoma  . C. difficile colitis   . C. difficile diarrhea   . Depression   . Osteoporosis 06/20/2016   T-2.5@spine  on Dexa ([facility])  . Plantar fasciitis   . PONV (postoperative nausea and vomiting)   . Symptomatic anemia 07/16/2017    Past Surgical History:  Procedure Laterality Date  . BIOPSY  07/03/2018   Procedure: BIOPSY;  Surgeon: Gatha Mayer, MD;  Location: [facility] ENDOSCOPY;  Service: Endoscopy;;  . BREAST BIOPSY Bilateral    benign and a right stereotatic breast biopsy.  . COLONOSCOPY    . ESOPHAGOGASTRODUODENOSCOPY N/A 07/16/2017   Procedure: ESOPHAGOGASTRODUODENOSCOPY (EGD);  Surgeon: Ladene Artist, MD;  Location: [facility] ENDOSCOPY;  Service: Endoscopy;  Laterality: N/A;  . ESOPHAGOGASTRODUODENOSCOPY (EGD) WITH PROPOFOL N/A 07/03/2018   Procedure: ESOPHAGOGASTRODUODENOSCOPY (EGD) WITH PROPOFOL;  Surgeon: Gatha Mayer, MD;  Location: [facility]  ENDOSCOPY;  Service: Endoscopy;  Laterality: N/A;  . fibroid excision  age 64   prolapsed fibroid removed  . FOOT SURGERY Left 2014   Dr. Paulla Dolly  . FOOT SURGERY Right 08/2016   Dr. Paulla Dolly --Shortening Osteotomy with Fixation 1st Metatarsal   . MOUTH SURGERY  06/2017   [facility]   . PORT-A-CATH REMOVAL N/A 02/15/2016   Procedure: REMOVAL PORT-A-CATH;  Surgeon: Autumn Messing III, MD;  Location: [place];  Service: General;  Laterality: N/A;  . RADIOACTIVE SEED GUIDED PARTIAL MASTECTOMY WITH AXILLARY SENTINEL LYMPH NODE BIOPSY Left 05/25/2015   Procedure: LEFT BREAST LUMPECTOMY WITH RADIOACTIVE SEED AND SENTINEL LYMPH NODE BIOPSY;  Surgeon: Autumn Messing III, MD;  Location: [place];  Service: General;  Laterality: Left;  . UPPER GASTROINTESTINAL ENDOSCOPY      Prior to Admission medications   Medication Sig Start Date End Date Taking? Authorizing Provider  acetaminophen (TYLENOL) 325 MG tablet Take 650 mg by mouth every 6 (six) hours as needed (for pain.).   Yes [provider]  carboxymethylcellulose (REFRESH PLUS) 0.5 % SOLN Place 1 drop into both eyes 3 (three) times daily as needed (dry eyes.).   Yes [provider]  Cholecalciferol (VITAMIN D) 2000 UNITS tablet Take 2,000 Units by mouth daily.   Yes [provider]  fluticasone (FLONASE) 50 MCG/ACT nasal spray Place 1 spray into both nostrils daily as needed for allergies.    Yes [provider]  LAMICTAL 200 MG tablet Take 0.5 tablets (100 mg total) by mouth daily. 07/16/18  Yes Rita Ohara, MD  LUTEIN-ZEAXANTHIN PO Take 1 tablet by mouth daily.   Yes [provider]  magnesium 30 MG tablet Take 30 mg by mouth daily.   Yes [provider]  ondansetron (ZOFRAN ODT) 4 MG disintegrating tablet Take 1 tablet (4 mg total) by mouth every 8 (eight) hours as needed for nausea. 06/30/18  Yes Larene Pickett, PA-C  pantoprazole (PROTONIX) 40 MG tablet Take 1 tablet (40 mg total)  by mouth 2 (two) times daily. 07/16/18  Yes Esterwood, Amy S, PA-C  promethazine (PHENERGAN) 25 MG tablet Take 1 tablet (25 mg total) by mouth every 6 (six) hours as needed for nausea or vomiting. 01/07/18  Yes Rita Ohara, MD  Zoledronic Acid (ZOMETA IV) Inject 1 Dose into the vein every 6 (six) months.    Yes [provider]    Current Facility-Administered Medications  Medication Dose Route Frequency Provider Last Rate Last Dose  . lactated ringers infusion   Intravenous Continuous Gatha Mayer, MD      . lactated ringers infusion   Intravenous Continuous Gatha Mayer, MD 10 mL/hr at 09/02/18 0801 1,000 mL at 09/02/18 0801    Allergies as of 08/26/2018 - Review Complete 08/05/2018  Allergen Reaction Noted  . Clindamycin/lincomycin Diarrhea 09/12/2017  . Nsaids Other (See Comments) 06/29/2018  . Other Nausea Only 06/30/2018  . Adhesive [tape] Rash 01/25/2015    Family History  Problem Relation Age of Onset  . Heart disease Father   . Skin cancer Father        non melanoma - farmer  . Hypertension Mother   . Arthritis Mother        osteoarthritis  . Macular degeneration Mother        wet and dry  . Colon cancer Mother 69       colon cancer at age 43  . Osteoporosis Sister        osteopenia (states it resolved)  . Lung cancer Maternal Aunt        lung cancer  . Cancer Paternal Aunt 21       ? stomach cancer  . Autism Sister   . Mental retardation Sister   . Blindness Sister        secondary to retrolental fibroplasia  . Osteoporosis Sister   . Goiter Paternal Aunt   . Diabetes Paternal Aunt   . Esophageal cancer Neg Hx   . Rectal cancer Neg Hx   . Stomach cancer Neg Hx     Social History   Socioeconomic History  . Marital status: Married    Spouse name: Not on file  . Number of children: Not on file  . Years of education: Not on file  . Highest education level: Not on file  Occupational History  . Not on file  Social Needs  . Financial resource  strain: Not on file  . Food insecurity:    Worry: Not on file    Inability: Not on file  . Transportation needs:    Medical: Not on file    Non-medical: Not on file  Tobacco Use  . Smoking status: Never Smoker  . Smokeless tobacco: Never Used  Substance and Sexual Activity  . Alcohol use: Yes    Alcohol/week: 0.0 standard drinks    Comment: 1-2 glasses of wine a month  . Drug use: No  . Sexual activity: Yes    Partners: Male    Comment: postmenopausal  Lifestyle  . Physical  activity:    Days per week: Not on file    Minutes per session: Not on file  . Stress: Not on file  Relationships  . Social connections:    Talks on phone: Not on file    Gets together: Not on file    Attends religious service: Not on file    Active member of club or organization: Not on file    Attends meetings of clubs or organizations: Not on file    Relationship status: Not on file  . Intimate partner violence:    Fear of current or ex partner: Not on file    Emotionally abused: Not on file    Physically abused: Not on file    Forced sexual activity: Not on file  Other Topics Concern  . Not on file  Social History Narrative   Married [name]), 1 dog (chocolate lab). Homemaker    Review of Systems: Positive for fatigue  All other review of systems negative except as mentioned in the HPI.  Physical Exam: Vital signs in last 24 hours: Temp:  [98 F (36.7 C)] 98 F (36.7 C) (11/20 0755) Pulse Rate:  [77] 77 (11/20 0755) Resp:  [20] 20 (11/20 0755) BP: (119)/(77) 119/77 (11/20 0755) SpO2:  [100 %] 100 % (11/20 0755) Weight:  [67.2 kg] 67.2 kg (11/20 0755)   General:   Alert,  Well-developed, well-nourished, pleasant and cooperative in NAD Lungs:  Clear throughout to auscultation.   Heart:  Regular rate and rhythm; no murmurs, clicks, rubs,  or gallops. Abdomen:  Soft, nontender and nondistended. Normal bowel sounds.   Neuro/Psych:  Alert and cooperative. Normal mood and affect. A and O x  3   @Carl  Simonne Maffucci, MD, [facility] [phone] (pager) 09/02/2018 8:22 AM@

## 2018-09-03 ENCOUNTER — Encounter: Payer: BLUE CROSS/BLUE SHIELD | Admitting: Internal Medicine

## 2018-09-09 NOTE — Progress Notes (Signed)
Called patient about atypical lymphoid infiltrate.  This needs further investigation.  I am thinking EUS  Will discuss w/ partners and we will contact her.

## 2018-09-17 ENCOUNTER — Other Ambulatory Visit: Payer: Self-pay

## 2018-09-17 ENCOUNTER — Telehealth: Payer: Self-pay

## 2018-09-17 DIAGNOSIS — K319 Disease of stomach and duodenum, unspecified: Secondary | ICD-10-CM

## 2018-09-17 NOTE — Telephone Encounter (Signed)
Appt scheduled for 10/22/18 830 am [facility] with Dr Ardis Hughs.  The pt was offered 12/26 but she will be out of town.  She was instructed and all questions answered.

## 2018-09-17 NOTE — Telephone Encounter (Signed)
-----   Message from Milus Banister, MD sent at 09/17/2018  7:22 AM EST ----- Regarding: RE: EUS? Glendell Docker, Unusual looking I agree.  I think further evaluation with EUS is a good next step.  Can you let her know about the plan and make sure she strictly avoids NSAIDs and is on daily PPI for now and I'll have Danyael Alipio get in touch about scheduling. Thanks   Michae Grimley, She needs upper EUS, radial +/- linear, first available appt with me or Gabe for abnormal duodenum, periampulla.  This is a chronic issue and so other cases that you've already heard about should take precedence in scheduling.    Wynetta Fines  Thanks ALL.   ----- Message ----- From: Gatha Mayer, MD Sent: 09/15/2018   5:55 PM EST To: Milus Banister, MD, # Subject: EUS?                                           Fletcher Anon,  This lady has an odd ulcer/abnormal mucosa problem with atypical lymphoid infiltrate on biopsies.  Orig had DU thought from NSAID's Fall 2018.  Then this Fall has had ulceration and problems seen on 3 EGD's. Last 2 used duodenoscope.  Also has had biliary obstruction pattern (transient).  I think an EUS to evaluate and sample is next?  Thanks  Glendell Docker

## 2018-09-22 NOTE — Progress Notes (Signed)
EUS on 10/22/2018 - Dr. Ardis Hughs

## 2018-10-14 DIAGNOSIS — C8290 Follicular lymphoma, unspecified, unspecified site: Secondary | ICD-10-CM

## 2018-10-14 HISTORY — DX: Follicular lymphoma, unspecified, unspecified site: C82.90

## 2018-10-22 ENCOUNTER — Other Ambulatory Visit: Payer: Self-pay

## 2018-10-22 ENCOUNTER — Encounter (HOSPITAL_COMMUNITY)
Admission: RE | Disposition: A | Discharge status: Home / Self Care | Payer: Self-pay | Source: Home / Self Care | Attending: Gastroenterology

## 2018-10-22 ENCOUNTER — Ambulatory Visit (HOSPITAL_COMMUNITY): Payer: BLUE CROSS/BLUE SHIELD | Admitting: Certified Registered Nurse Anesthetist

## 2018-10-22 ENCOUNTER — Encounter (HOSPITAL_COMMUNITY): Payer: Self-pay | Admitting: *Deleted

## 2018-10-22 ENCOUNTER — Ambulatory Visit (HOSPITAL_COMMUNITY)
Admission: RE | Admit: 2018-10-22 | Discharge: 2018-10-22 | Disposition: A | Discharge status: Home / Self Care | Payer: BLUE CROSS/BLUE SHIELD | Attending: Gastroenterology | Admitting: Gastroenterology

## 2018-10-22 DIAGNOSIS — C8213 Follicular lymphoma grade II, intra-abdominal lymph nodes: Secondary | ICD-10-CM | POA: Insufficient documentation

## 2018-10-22 DIAGNOSIS — Z8711 Personal history of peptic ulcer disease: Secondary | ICD-10-CM | POA: Diagnosis not present

## 2018-10-22 DIAGNOSIS — K2980 Duodenitis without bleeding: Secondary | ICD-10-CM

## 2018-10-22 DIAGNOSIS — M810 Age-related osteoporosis without current pathological fracture: Secondary | ICD-10-CM | POA: Insufficient documentation

## 2018-10-22 DIAGNOSIS — K319 Disease of stomach and duodenum, unspecified: Secondary | ICD-10-CM

## 2018-10-22 DIAGNOSIS — K3189 Other diseases of stomach and duodenum: Secondary | ICD-10-CM

## 2018-10-22 DIAGNOSIS — Z853 Personal history of malignant neoplasm of breast: Secondary | ICD-10-CM | POA: Insufficient documentation

## 2018-10-22 HISTORY — PX: EUS: SHX5427

## 2018-10-22 HISTORY — PX: BIOPSY: SHX5522

## 2018-10-22 HISTORY — PX: FINE NEEDLE ASPIRATION: SHX6590

## 2018-10-22 HISTORY — PX: ESOPHAGOGASTRODUODENOSCOPY: SHX5428

## 2018-10-22 SURGERY — UPPER ENDOSCOPIC ULTRASOUND (EUS) RADIAL
Anesthesia: Monitor Anesthesia Care

## 2018-10-22 MED ORDER — PROPOFOL 500 MG/50ML IV EMUL
INTRAVENOUS | Status: DC | PRN
  Administered 2018-10-22: 125 ug/kg/min via INTRAVENOUS

## 2018-10-22 MED ORDER — SODIUM CHLORIDE 0.9 % IV SOLN: INTRAVENOUS | Status: DC

## 2018-10-22 MED ORDER — PHENYLEPHRINE 40 MCG/ML (10ML) SYRINGE FOR IV PUSH (FOR BLOOD PRESSURE SUPPORT)
PREFILLED_SYRINGE | INTRAVENOUS | Status: DC | PRN
  Administered 2018-10-22: 80 ug via INTRAVENOUS

## 2018-10-22 MED ORDER — PROPOFOL 10 MG/ML IV BOLUS
INTRAVENOUS | Status: DC | PRN
  Administered 2018-10-22: 50 mg via INTRAVENOUS

## 2018-10-22 MED ORDER — LIDOCAINE 2% (20 MG/ML) 5 ML SYRINGE
INTRAMUSCULAR | Status: DC | PRN
  Administered 2018-10-22: 60 mg via INTRAVENOUS

## 2018-10-22 MED ORDER — PROPOFOL 10 MG/ML IV BOLUS
INTRAVENOUS | Status: AC
  Filled 2018-10-22: qty 80

## 2018-10-22 MED ORDER — LACTATED RINGERS IV SOLN
INTRAVENOUS | Status: DC
  Administered 2018-10-22: 1000 mL via INTRAVENOUS

## 2018-10-22 NOTE — Transfer of Care (Signed)
Immediate Anesthesia Transfer of Care Note  Patient: Rhonda Gardner  Procedure(s) Performed: UPPER ENDOSCOPIC ULTRASOUND (EUS) RADIAL (N/A ) FINE NEEDLE ASPIRATION BIOPSY  Patient Location: Endoscopy Unit  Anesthesia Type:MAC  Level of Consciousness: awake, alert , oriented and patient cooperative  Airway & Oxygen Therapy: Patient Spontanous Breathing and Patient connected to nasal cannula oxygen  Post-op Assessment: Report given to RN, Post -op Vital signs reviewed and stable and Patient moving all extremities  Post vital signs: Reviewed and stable  Last Vitals:  Vitals Value Taken Time  BP    Temp    Pulse 74 10/22/2018  9:03 AM  Resp 13 10/22/2018  9:03 AM  SpO2 100 % 10/22/2018  9:03 AM  Vitals shown include unvalidated device data.  Last Pain:  Vitals:   10/22/18 0728  TempSrc: Oral  PainSc: 0-No pain         Complications: No apparent anesthesia complications

## 2018-10-22 NOTE — Anesthesia Preprocedure Evaluation (Signed)
Anesthesia Evaluation  Patient identified by MRN, date of birth, ID band Patient awake    Reviewed: Allergy & Precautions, NPO status , Patient's Chart, lab work & pertinent test results  History of Anesthesia Complications (+) PONV  Airway Mallampati: II  TM Distance: >3 FB Neck ROM: Full    Dental no notable dental hx.    Pulmonary neg pulmonary ROS,    Pulmonary exam normal breath sounds clear to auscultation       Cardiovascular negative cardio ROS Normal cardiovascular exam Rhythm:Regular Rate:Normal     Neuro/Psych Bipolar Disorder negative neurological ROS     GI/Hepatic Neg liver ROS, PUD,   Endo/Other  negative endocrine ROS  Renal/GU negative Renal ROS  negative genitourinary   Musculoskeletal negative musculoskeletal ROS (+)   Abdominal   Peds negative pediatric ROS (+)  Hematology negative hematology ROS (+)   Anesthesia Other Findings   Reproductive/Obstetrics negative OB ROS                             Anesthesia Physical Anesthesia Plan  ASA: II  Anesthesia Plan: MAC   Post-op Pain Management:    Induction: Intravenous  PONV Risk Score and Plan: 0  Airway Management Planned: Simple Face Mask  Additional Equipment:   Intra-op Plan:   Post-operative Plan:   Informed Consent: I have reviewed the patients History and Physical, chart, labs and discussed the procedure including the risks, benefits and alternatives for the proposed anesthesia with the patient or authorized representative who has indicated his/her understanding and acceptance.   Dental advisory given  Plan Discussed with: CRNA and Surgeon  Anesthesia Plan Comments:         Anesthesia Quick Evaluation

## 2018-10-22 NOTE — Op Note (Signed)
[facility] Patient Name: Rhonda Gardner Procedure Date: 10/22/2018 MRN: [id number] Attending MD: Milus Banister , MD Date of Birth: 08-Sep-1956 CSN: [id number] Age: 63 Admit Type: Outpatient Procedure:                Upper EUS Indications:              abnormal periampullary mucosa since at least                            07/2017; several EGDs with the most recent 08/2018                            Dr. Carlean Purl abnormal mucosa biopsies suggested                            atypical lymphoid aggregates. She's been strictly                            avoiding NSAIDs for many months, mild weight loss                            in pat year (5 pounds), has intermittent abdominal                            pains that may be related. Providers:                Milus Banister, MD, Cleda Daub, RN, Charolette Child, Technician, Caryl Pina CRNA Referring MD:             Silvano Rusk, MD Medicines:                Monitored Anesthesia Care Complications:            No immediate complications. Estimated blood loss:                            None. Estimated Blood Loss:     Estimated blood loss: none. Procedure:                Pre-Anesthesia Assessment:                           - Prior to the procedure, a History and Physical                            was performed, and patient medications and                            allergies were reviewed. [name] tolerance of                            previous anesthesia was also reviewed. The risks  and benefits of the procedure and the sedation                            options and risks were discussed with the patient.                            All questions were answered, and informed consent                            was obtained. Prior Anticoagulants: The patient has                            taken no previous anticoagulant or antiplatelet      agents. ASA Grade Assessment: II - A patient with                            mild systemic disease. After reviewing the risks                            and benefits, the patient was deemed in                            satisfactory condition to undergo the procedure.                           After obtaining informed consent, the endoscope was                            passed under direct vision. Throughout the                            procedure, the patient's blood pressure, pulse, and                            oxygen saturations were monitored continuously. The                            [id number]-[id number] ([id number]) Olympus Radial EUS was                            introduced through the mouth, and advanced to the                            second part of duodenum. The [id number] ([id number])                            Olympus Linear EUS was introduced through the                            mouth, and advanced to the second part of duodenum.                            The [id number] ([id number]) Olympus ERCP was introduced  through the mouth, and advanced to the second part                            of duodenum. The upper EUS was accomplished without                            difficulty. The patient tolerated the procedure                            well. Scope In: Scope Out: Findings:      ENDOSCOPIC FINDING: :      1. The periampullary mucosa (extending from the major to the minor       papilla) was abnormal appearing. It was not overtly neoplastic but       rather denuded and very friable for about 2cm. There was minor contact       related oozing. The abnormal mucosa was sampled with forceps.      2. UGI tract was otherwise normal.      ENDOSONOGRAPHIC FINDING: :      1. There was abnormal, hypoechoic soft tissue in the periampullary       region. This involves the wall of the duodenum deeply and may involve       the pancreatic parenchyma as well (imaging  was not clear), measuring       1.5cm across. The abnormal soft tissue does not but any major vascular       structures. Fine needle aspiration for cytology was performed. Color       Doppler imaging was utilized prior to needle puncture to confirm a lack       of significant vascular structures within the needle path. One pass was       made with the 25 gauge needle using a transduodenal approach. A       cytotechnologist was present to evaluate the adequacy of the specimen. I       could tell that the single FNA pass caused retroperitoneal bleeding,       hematoma which measured 2.5cm across. No further FNA was performed.      2. The pancreatic parenchyma was otherwise normal throughout the gland.      3. CBD was normal, non-dilated.      4. Main pancreatic duct was normal, non-dilated.      5. No peripancreatic adenopathy.      6. Limited views of the liver, spleen were normal. 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 Previously attributed to NSAIDs however she                            has strictly avoided them for many months and so I                            do not think NSAIDs are the culprit. This is                            assoicated with abnormal soft tissue measuring                            1.5cm (involving the wall of the duodenum and                            possibly the pancreatic parenchyma as well). I                            sampled the process with biopsy forceps and a                            single transduodenal FNA pass which appeared to                            cause localized retroperitoneal bleeding, small                            hematoma. Await final path  and cytology results.                            She and her husband know to call if she has pains                            that worsen throughout the day. She may need                            surgical resection (whipple). Moderate Sedation:      Not Applicable - Patient had care per Anesthesia. Recommendation:           - Discharge patient to home. Procedure Code(s):        --- Professional ---                           [phone], Esophagogastroduodenoscopy, flexible,                            transoral; with transendoscopic ultrasound-guided                            intramural or transmural fine needle                            aspiration/biopsy(s), (includes endoscopic  ultrasound examination limited to the esophagus,                            stomach or duodenum, and adjacent structures)                           43239, 59, Esophagogastroduodenoscopy, flexible,                            transoral; with biopsy, single or multiple Diagnosis Code(s):        --- Professional ---                           K29.80, Duodenitis without bleeding                           K31.89, Other diseases of stomach and duodenum CPT copyright 2018 American Medical Association. All rights reserved. The codes documented in this report are preliminary and upon coder review may  be revised to meet current compliance requirements. Milus Banister, MD 10/22/2018 9:42:28 AM This report has been signed electronically. Number of Addenda: 0

## 2018-10-22 NOTE — Discharge Instructions (Signed)
YOU HAD AN ENDOSCOPIC PROCEDURE TODAY: Refer to the procedure report and other information in the discharge instructions given to you for any specific questions about what was found during the examination. If this information does not answer your questions, please call [facility] office at [phone] to clarify.  ° °YOU SHOULD EXPECT: Some feelings of bloating in the abdomen. Passage of more gas than usual. Walking can help get rid of the air that was put into your GI tract during the procedure and reduce the bloating. If you had a lower endoscopy (such as a colonoscopy or flexible sigmoidoscopy) you may notice spotting of blood in your stool or on the toilet paper. Some abdominal soreness may be present for a day or two, also. ° °DIET: Your first meal following the procedure should be a light meal and then it is ok to progress to your normal diet. A half-sandwich or bowl of soup is an example of a good first meal. Heavy or fried foods are harder to digest and may make you feel nauseous or bloated. Drink plenty of fluids but you should avoid alcoholic beverages for 24 hours. If you had a esophageal dilation, please see attached instructions for diet.   ° °ACTIVITY: Your care partner should take you home directly after the procedure. You should plan to take it easy, moving slowly for the rest of the day. You can resume normal activity the day after the procedure however YOU SHOULD NOT DRIVE, use power tools, machinery or perform tasks that involve climbing or major physical exertion for 24 hours (because of the sedation medicines used during the test).  ° °SYMPTOMS TO REPORT IMMEDIATELY: °A gastroenterologist can be reached at any hour. Please call [phone]  for any of the following symptoms:  °Following lower endoscopy (colonoscopy, flexible sigmoidoscopy) °Excessive amounts of blood in the stool  °Significant tenderness, worsening of abdominal pains  °Swelling of the abdomen that is new, acute  °Fever of 100° or  higher  °Following upper endoscopy (EGD, EUS, ERCP, esophageal dilation) °Vomiting of blood or coffee ground material  °New, significant abdominal pain  °New, significant chest pain or pain under the shoulder blades  °Painful or persistently difficult swallowing  °New shortness of breath  °Black, tarry-looking or red, bloody stools ° °FOLLOW UP:  °If any biopsies were taken you will be contacted by phone or by letter within the next 1-3 weeks. Call [phone]  if you have not heard about the biopsies in 3 weeks.  °Please also call with any specific questions about appointments or follow up tests. ° °

## 2018-10-22 NOTE — H&P (Signed)
HPI: This is a very pleasant 63 yo woman  Chief complaint is abnormal duodenum, periampullary region since at least 07/2017.   Most recent EGD Dr. Carlean Purl with bx that suggest "atypical lymphoid tissue"  No masses, tumors on cross sectional imaging 06/2018  ROS: complete GI ROS as described in HPI, all other review negative.  Constitutional:  No unintentional weight loss   Past Medical History:  Diagnosis Date  . Allergy 2006   chemical cauterization in spring 2006, and 2nd treatment with good results.(Dr.Shoemaker)  . Bipolar disorder ([place])   . Bleeding ulcer   . Blood transfusion without reported diagnosis 07/2017   Bleeding ulcer  . Breast cancer ([place]) 12/2014   invasive ductal carcinoma (LEFT)  . Breast cancer of upper-outer quadrant of left female breast ([place]) 12/22/2014  . Breast hematoma 12/19/2015   had left breast aspiration of 4cm hematoma-path indicates benign hematoma  . C. difficile colitis   . C. difficile diarrhea   . Depression   . Osteoporosis 06/20/2016   T-2.5@spine  on Dexa ([facility])  . Plantar fasciitis   . PONV (postoperative nausea and vomiting)   . Symptomatic anemia 07/16/2017    Past Surgical History:  Procedure Laterality Date  . BIOPSY  07/03/2018   Procedure: BIOPSY;  Surgeon: Gatha Mayer, MD;  Location: [facility];  Service: Endoscopy;;  . BIOPSY  09/02/2018   Procedure: BIOPSY;  Surgeon: Gatha Mayer, MD;  Location: [facility];  Service: Endoscopy;;  . BREAST BIOPSY Bilateral    benign and a right stereotatic breast biopsy.  . COLONOSCOPY    . ESOPHAGOGASTRODUODENOSCOPY N/A 07/16/2017   Procedure: ESOPHAGOGASTRODUODENOSCOPY (EGD);  Surgeon: Ladene Artist, MD;  Location: [facility];  Service: Endoscopy;  Laterality: N/A;  . ESOPHAGOGASTRODUODENOSCOPY (EGD) WITH PROPOFOL N/A 07/03/2018   Procedure: ESOPHAGOGASTRODUODENOSCOPY (EGD) WITH PROPOFOL;  Surgeon: Gatha Mayer, MD;  Location: [facility];  Service: Endoscopy;   Laterality: N/A;  . ESOPHAGOGASTRODUODENOSCOPY (EGD) WITH PROPOFOL N/A 09/02/2018   Procedure: ESOPHAGOGASTRODUODENOSCOPY (EGD) WITH PROPOFOL;  Surgeon: Gatha Mayer, MD;  Location: [facility];  Service: Endoscopy;  Laterality: N/A;  . fibroid excision  age 1   prolapsed fibroid removed  . FOOT SURGERY Left 2014   Dr. Paulla Dolly  . FOOT SURGERY Right 08/2016   Dr. Paulla Dolly --Shortening Osteotomy with Fixation 1st Metatarsal   . MOUTH SURGERY  06/2017   [facility]   . PORT-A-CATH REMOVAL N/A 02/15/2016   Procedure: REMOVAL PORT-A-CATH;  Surgeon: Autumn Messing III, MD;  Location: [facility];  Service: General;  Laterality: N/A;  . RADIOACTIVE SEED GUIDED PARTIAL MASTECTOMY WITH AXILLARY SENTINEL LYMPH NODE BIOPSY Left 05/25/2015   Procedure: LEFT BREAST LUMPECTOMY WITH RADIOACTIVE SEED AND SENTINEL LYMPH NODE BIOPSY;  Surgeon: Autumn Messing III, MD;  Location: [facility];  Service: General;  Laterality: Left;  . UPPER GASTROINTESTINAL ENDOSCOPY      Current Facility-Administered Medications  Medication Dose Route Frequency Provider Last Rate Last Dose  . 0.9 %  sodium chloride infusion   Intravenous Continuous Milus Banister, MD      . lactated ringers infusion   Intravenous Continuous Milus Banister, MD        Allergies as of 09/17/2018 - Review Complete 09/02/2018  Allergen Reaction Noted  . Clindamycin/lincomycin Diarrhea 09/12/2017  . Nsaids Other (See Comments) 06/29/2018  . Other Nausea Only 06/30/2018  . Adhesive [tape] Rash 01/25/2015    Family History  Problem Relation Age of Onset  . Heart disease  Father   . Skin cancer Father        non melanoma - farmer  . Hypertension Mother   . Arthritis Mother        osteoarthritis  . Macular degeneration Mother        wet and dry  . Colon cancer Mother 69       colon cancer at age 9  . Osteoporosis Sister        osteopenia (states it resolved)  . Lung cancer Maternal Aunt        lung cancer  . Cancer  Paternal Aunt 73       ? stomach cancer  . Autism Sister   . Mental retardation Sister   . Blindness Sister        secondary to retrolental fibroplasia  . Osteoporosis Sister   . Goiter Paternal Aunt   . Diabetes Paternal Aunt   . Esophageal cancer Neg Hx   . Rectal cancer Neg Hx   . Stomach cancer Neg Hx     Social History   Socioeconomic History  . Marital status: Married    Spouse name: Not on file  . Number of children: Not on file  . Years of education: Not on file  . Highest education level: Not on file  Occupational History  . Not on file  Social Needs  . Financial resource strain: Not on file  . Food insecurity:    Worry: Not on file    Inability: Not on file  . Transportation needs:    Medical: Not on file    Non-medical: Not on file  Tobacco Use  . Smoking status: Never Smoker  . Smokeless tobacco: Never Used  Substance and Sexual Activity  . Alcohol use: Yes    Alcohol/week: 0.0 standard drinks    Comment: 1-2 glasses of wine a month  . Drug use: No  . Sexual activity: Yes    Partners: Male    Comment: postmenopausal  Lifestyle  . Physical activity:    Days per week: Not on file    Minutes per session: Not on file  . Stress: Not on file  Relationships  . Social connections:    Talks on phone: Not on file    Gets together: Not on file    Attends religious service: Not on file    Active member of club or organization: Not on file    Attends meetings of clubs or organizations: Not on file    Relationship status: Not on file  . Intimate partner violence:    Fear of current or ex partner: Not on file    Emotionally abused: Not on file    Physically abused: Not on file    Forced sexual activity: Not on file  Other Topics Concern  . Not on file  Social History Narrative   Married [name]), 1 dog (chocolate lab). Homemaker   Rare wine   No drugs, tobacco     Physical Exam: BP (!) 122/55   Pulse 70   Temp 98.2 F (36.8 C) (Oral)   Resp 14    Ht 5\' 7"  (1.702 m)   Wt 63.5 kg   SpO2 98%   BMI 21.93 kg/m  Constitutional: generally well-appearing Psychiatric: alert and oriented x3 Abdomen: soft, nontender, nondistended, no obvious ascites, no peritoneal signs, normal bowel sounds No peripheral edema noted in lower extremities  Assessment and plan: 63 y.o. female with abnormal periampullary duodenum since 07/2017  For upper EUS  evaluation, likely repeat mucosal biopsies, evaluate with duodenoscope  Please see the "Patient Instructions" section for addition details about the plan.  Owens Loffler, MD [facility] 10/22/2018, 7:30 AM

## 2018-10-22 NOTE — Anesthesia Postprocedure Evaluation (Signed)
Anesthesia Post Note  Patient: Rhonda Gardner  Procedure(s) Performed: UPPER ENDOSCOPIC ULTRASOUND (EUS) RADIAL (N/A ) FINE NEEDLE ASPIRATION BIOPSY     Patient location during evaluation: PACU Anesthesia Type: MAC Level of consciousness: awake and alert Pain management: pain level controlled Vital Signs Assessment: post-procedure vital signs reviewed and stable Respiratory status: spontaneous breathing, nonlabored ventilation, respiratory function stable and patient connected to nasal cannula oxygen Cardiovascular status: stable and blood pressure returned to baseline Postop Assessment: no apparent nausea or vomiting Anesthetic complications: no    Last Vitals:  Vitals:   10/22/18 0904 10/22/18 0910  BP: 108/87 (!) 110/92  Pulse: 74 70  Resp: 17 (!) 21  Temp: 36.5 C   SpO2: 100% 96%    Last Pain:  Vitals:   10/22/18 0904  TempSrc: Oral  PainSc: 0-No pain                 Ife Vitelli S

## 2018-10-23 ENCOUNTER — Encounter (HOSPITAL_COMMUNITY): Payer: Self-pay | Admitting: Gastroenterology

## 2018-10-26 ENCOUNTER — Other Ambulatory Visit: Payer: Self-pay

## 2018-10-26 DIAGNOSIS — C8290 Follicular lymphoma, unspecified, unspecified site: Secondary | ICD-10-CM

## 2018-10-30 ENCOUNTER — Telehealth: Payer: Self-pay | Admitting: Hematology

## 2018-10-30 NOTE — Telephone Encounter (Signed)
Received a msg from Rhonda Gardner stating the pt had lft a vm on 1/16 to schedule an appt w/Dr. Irene Limbo. I cld and lft the pt a vm to schedule an appt.

## 2018-10-30 NOTE — Progress Notes (Signed)
HEMATOLOGY/ONCOLOGY CONSULTATION NOTE  Date of Service: 11/02/2018  Patient Care Team: Rita Ohara, MD as PCP - General (Family Medicine) Magrinat, Virgie Dad, MD as Consulting Physician (Oncology) Thea Silversmith, MD as Consulting Physician (Radiation Oncology) Mauro Kaufmann, RN as Registered Nurse Rockwell Germany, RN as Registered Nurse Holley Bouche, NP (Inactive) as Nurse Practitioner (Nurse Practitioner) Jovita Kussmaul, MD as Consulting Physician (General Surgery)  CHIEF COMPLAINTS/PURPOSE OF CONSULTATION:  Newly Diagnosed Follicular Lymphoma  Oncologic History:    Malignant neoplasm of upper-outer quadrant of left breast in female, estrogen receptor negative ([place])   12/15/2014 Mammogram    A bilobed mass is identified in the left breast upper outer quadrant corresponding to the questioned palpable finding measuring approximately 2 cm.    12/15/2014 Breast US    Suspicious left breast mass 2 o'clock location    12/19/2014 Initial Biopsy    Left breast core needle bx: invasive ductal carcinoma, grade 2 or 3, ER- (0%), PR- (0%), HER2 equivocal but positive on further testing (ratio 3.4), Ki67 38%    12/19/2014 Clinical Stage    Stage IIA: T2 N0    12/27/2014 Breast MRI    2.5 cm enhancing left breast 2 o'clock location mass compatible with biopsy-proven malignancy. This directly abuts the left pectoralis major muscle and superficial muscle involvement is possible although there is no abnormal intramuscular enhancement    01/16/2015 - 04/21/2015 Neo-Adjuvant Chemotherapy    Carboplatin, docetaxel (changed to gemcitabine for final 2 cycles due to CIPN), trastuzumab, and pertuzumab x 6 cycles    05/2015 - 01/08/2016 Chemotherapy    Maintenance biotherapy with trastuzumab to complete one year of therapy    05/25/2015 Definitive Surgery    Left lumpectomy/SLNB: no residual invasive disease    05/25/2015 Pathologic Stage    ypT0 ypN0    06/28/2015 - 08/11/2015 Radiation Therapy      Left breast/ 45 Gy at 1.8 Gy per fraction x 25 fractions.  Left breast boost/ 16 Gy at 2 Gy per fraction x 8 fractions    04/02/2016 Survivorship    SCP visit completed and copy given to patient      HISTORY OF PRESENTING ILLNESS:   Rhonda Gardner is a wonderful 63 y.o. female who has been referred to [place] by Dr. Rita Ohara for evaluation and management of Follicular lymphoma. She is accompanied today by her husband. The pt reports that she is doing well overall.   Of note prior to today's visit, the pt was hospitalized between 07/02/18 and 07/04/18 after an EGD revealed an oozing, cratered duodenal ulcer, with benign pathology. Prior to this, the pt had taken omeprazole intermittently. The pt began 78m Protonix BID and then had a follow up EGD on 09/03/18, the pathology of which revealed atypical lymphoid tissue. The pt then had a repeat EUS/EGD on 10/22/18, the pathology of which revealed Follicular Lymphoma, grade 1 or 2.  The pt reports that she first was admitted in September or October 2018 for a bleeding duodenal ulcer. At the time she also developed C.diff. She did not have H.pylori and notes that there was suspicion at this time, that the ulcer was "unusual looking." The pt is taking 434mProtonix BID at this time.   The pt notes that she has a significant, dull, belly-ache which comes and goes. The pt denies associations with onset, and denies this worsening, but endorses some improvement in the last few months. The pt is also taking iron supplements  since November, and notes that her stools are occasionally more dark, and denies blood in the stools or black stools. She did have a blood transfusion in the last 2 years. The pt notes that her last HGB was at 12 a couple weeks ago, on outside records that are not available.   The pt believes that she has had more fatigue since late 2018. She denies fevers, chills, night sweats, unexpected weight loss. She endorses stable weight. She  denies changes in her eating habits.   The pt receives Zometa q9month for osteoporosis.  Of note prior to the patient's visit today, pt has had a CT A/P completed on 06/29/18 with results revealing No evidence of bowel obstruction or inflammation. Appendix is normal. 2. Gallbladder wall is thickened and there is mild pericholecystic edema. No stones are identified but this could indicate cholecystitis in the appropriate clinical setting. Consider ultrasound for further evaluation if clinically indicated. 3. Prominent parapelvic cysts in the left kidney. No hydronephrosis or hydroureter. 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  Most recent lab results (08/12/18) of CBC w/diff and CMP is as follows: all values are WNL except for RBC at 3.41, HGB at 9.4, HCT at 30.1, RDW at 16.2.  On review of systems, pt reports some fatigue, stable weight, stable eating habits, intermittent dull belly aches, and denies fevers, chills, night sweats, unexpected weight loss, blood in the stools, black stools, current abdominal pains, lower abdominal pains, leg swelling, noticing any new lumps or bumps, and any other symptoms.   On PMHx the pt reports Stage IIA invasive ductal carcinoma, bleeding ulcer, C.diff. On Social Hx the pt reports social ETOH consumption and denies ever smoking cigarettes On Family Hx the pt reports mother with late onset colon cancer.   MEDICAL HISTORY:  Past Medical History:  Diagnosis Date  . Allergy 2006   chemical cauterization in spring 2006, and 2nd treatment with good results.(Dr.Shoemaker)  . [place]   . Bleeding ulcer   . Blood transfusion without reported diagnosis 07/2017   Bleeding ulcer  . [place]   invasive ductal carcinoma (LEFT)  . Breast cancer of upper-outer quadrant of left [place] breast [place] 12/22/2014  . Breast hematoma 12/19/2015   had left breast aspiration of 4cm hematoma-path indicates benign hematoma  . C. difficile colitis   . C.  difficile diarrhea   . Depression   . Osteoporosis 06/20/2016   T-2.5'@spine'  on Dexa ([facility])  . Plantar fasciitis   . PONV (postoperative nausea and vomiting)   . Symptomatic anemia 07/16/2017    SURGICAL HISTORY: Past Surgical History:  Procedure Laterality Date  . BIOPSY  07/03/2018   Procedure: BIOPSY;  Surgeon: GGatha Mayer MD;  [facility];  Service: Endoscopy;;  . BIOPSY  09/02/2018   Procedure: BIOPSY;  Surgeon: GGatha Mayer MD;  [facility];  Service: Endoscopy;;  . BIOPSY  10/22/2018   Procedure: BIOPSY;  Surgeon: JMilus Banister MD;  [facility];  Service: Endoscopy;;  . BREAST BIOPSY Bilateral    benign and a right stereotatic breast biopsy.  . COLONOSCOPY    . ESOPHAGOGASTRODUODENOSCOPY N/A 07/16/2017   Procedure: ESOPHAGOGASTRODUODENOSCOPY (EGD);  Surgeon: SLadene Artist MD;  [facility];  Service: Endoscopy;  Laterality: N/A;  . ESOPHAGOGASTRODUODENOSCOPY N/A 10/22/2018   Procedure: ESOPHAGOGASTRODUODENOSCOPY (EGD);  Surgeon: JMilus Banister MD;  [facility];  Service: Endoscopy;  Laterality: N/A;  . ESOPHAGOGASTRODUODENOSCOPY (EGD) WITH PROPOFOL N/A 07/03/2018  Procedure: ESOPHAGOGASTRODUODENOSCOPY (EGD) WITH PROPOFOL;  Surgeon: Gatha Mayer, MD;  [facility];  Service: Endoscopy;  Laterality: N/A;  . ESOPHAGOGASTRODUODENOSCOPY (EGD) WITH PROPOFOL N/A 09/02/2018   Procedure: ESOPHAGOGASTRODUODENOSCOPY (EGD) WITH PROPOFOL;  Surgeon: Gatha Mayer, MD;  [facility];  Service: Endoscopy;  Laterality: N/A;  . EUS N/A 10/22/2018   Procedure: UPPER ENDOSCOPIC ULTRASOUND (EUS) RADIAL;  Surgeon: Milus Banister, MD;  [facility];  Service: Endoscopy;  Laterality: N/A;  . fibroid excision  age 85   prolapsed fibroid removed  . FINE NEEDLE ASPIRATION  10/22/2018   Procedure: FINE NEEDLE ASPIRATION;  Surgeon: Milus Banister, MD;  [facility];  Service: Endoscopy;;  .  FOOT SURGERY Left 2014   Dr. Paulla Dolly  . FOOT SURGERY Right 08/2016   Dr. Paulla Dolly --Shortening Osteotomy with Fixation 1st Metatarsal   . MOUTH SURGERY  06/2017   [facility]   . PORT-A-CATH REMOVAL N/A 02/15/2016   Procedure: REMOVAL PORT-A-CATH;  Surgeon: Autumn Messing III, MD;  Location: [place];  Service: General;  Laterality: N/A;  . RADIOACTIVE SEED GUIDED PARTIAL MASTECTOMY WITH AXILLARY SENTINEL LYMPH NODE BIOPSY Left 05/25/2015   Procedure: LEFT BREAST LUMPECTOMY WITH RADIOACTIVE SEED AND SENTINEL LYMPH NODE BIOPSY;  Surgeon: Autumn Messing III, MD;  Location: [place];  Service: General;  Laterality: Left;  . UPPER GASTROINTESTINAL ENDOSCOPY      SOCIAL HISTORY: Social History   Socioeconomic History  . Marital status: Married    Spouse name: Not on file  . Number of children: Not on file  . Years of education: Not on file  . Highest education level: Not on file  Occupational History  . Not on file  Social Needs  . Financial resource strain: Not on file  . Food insecurity:    Worry: Not on file    Inability: Not on file  . Transportation needs:    Medical: Not on file    Non-medical: Not on file  Tobacco Use  . Smoking status: Never Smoker  . Smokeless tobacco: Never Used  Substance and Sexual Activity  . Alcohol use: Yes    Alcohol/week: 0.0 standard drinks    Comment: 1-2 glasses of wine a month  . Drug use: No  . Sexual activity: Yes    Partners: Male    Comment: postmenopausal  Lifestyle  . Physical activity:    Days per week: Not on file    Minutes per session: Not on file  . Stress: Not on file  Relationships  . Social connections:    Talks on phone: Not on file    Gets together: Not on file    Attends religious service: Not on file    Active member of club or organization: Not on file    Attends meetings of clubs or organizations: Not on file    Relationship status: Not on file  . Intimate partner violence:    Fear of current or ex  partner: Not on file    Emotionally abused: Not on file    Physically abused: Not on file    Forced sexual activity: Not on file  Other Topics Concern  . Not on file  Social History Narrative   Married [name]), 1 dog (chocolate lab). Homemaker   Rare wine   No drugs, tobacco    FAMILY HISTORY: Family History  Problem Relation Age of Onset  . Heart disease Father   . Skin cancer Father  non melanoma - farmer  . Hypertension Mother   . Arthritis Mother        osteoarthritis  . Macular degeneration Mother        wet and dry  . Colon cancer Mother 46       colon cancer at age 73  . Osteoporosis Sister        osteopenia (states it resolved)  . Lung cancer Maternal Aunt        lung cancer  . Cancer Paternal Aunt 72       ? stomach cancer  . Autism Sister   . Mental retardation Sister   . Blindness Sister        secondary to retrolental fibroplasia  . Osteoporosis Sister   . Goiter Paternal Aunt   . Diabetes Paternal Aunt   . Esophageal cancer Neg Hx   . Rectal cancer Neg Hx   . Stomach cancer Neg Hx     ALLERGIES:  is allergic to clindamycin/lincomycin; nsaids; other; and adhesive [tape].  MEDICATIONS:  Current Outpatient Medications  Medication Sig Dispense Refill  . acetaminophen (TYLENOL) 325 MG tablet Take 650 mg by mouth every 6 (six) hours as needed (for pain.).    Marland Kitchen carboxymethylcellulose (REFRESH PLUS) 0.5 % SOLN Place 1 drop into both eyes 3 (three) times daily as needed (dry eyes.).    Marland Kitchen Cholecalciferol (VITAMIN D-3) 125 MCG (5000 UT) TABS Take 5,000 Units by mouth daily.    . ferrous sulfate 325 (65 FE) MG EC tablet Take 1 tablet (325 mg total) by mouth 2 (two) times daily. 60 tablet 3  . fluticasone (FLONASE) 50 MCG/ACT nasal spray Place 1 spray into both nostrils daily.     Marland Kitchen LAMICTAL 200 MG tablet Take 0.5 tablets (100 mg total) by mouth daily. 45 tablet 0  . Lutein-Zeaxanthin 25-5 MG CAPS Take 1 capsule by mouth daily.    Marland Kitchen MAGNESIUM CITRATE PO Take  420 mg by mouth daily.    . ondansetron (ZOFRAN ODT) 4 MG disintegrating tablet Take 1 tablet (4 mg total) by mouth every 8 (eight) hours as needed for nausea. 10 tablet 0  . pantoprazole (PROTONIX) 40 MG tablet Take 1 tablet (40 mg total) by mouth 2 (two) times daily. 60 tablet 6  . promethazine (PHENERGAN) 25 MG tablet Take 1 tablet (25 mg total) by mouth every 6 (six) hours as needed for nausea or vomiting. 30 tablet 0  . Zoledronic Acid (ZOMETA IV) Inject 1 Dose into the vein every 6 (six) months.      No current facility-administered medications for this visit.     REVIEW OF SYSTEMS:    10 Point review of Systems was done is negative except as noted above.  PHYSICAL EXAMINATION: ECOG PERFORMANCE STATUS: 1 - Symptomatic but completely ambulatory  . Vitals:   11/02/18 1058  BP: 138/79  Pulse: 90  Resp: 18  Temp: 97.8 F (36.6 C)  SpO2: 100%   Filed Weights   11/02/18 1058  Weight: 148 lb (67.1 kg)   .Body mass index is 23.18 kg/m.  GENERAL:alert, in no acute distress and comfortable SKIN: no acute rashes, no significant lesions EYES: conjunctiva are pink and non-injected, sclera anicteric OROPHARYNX: MMM, no exudates, no oropharyngeal erythema or ulceration NECK: supple, no JVD LYMPH:  no palpable lymphadenopathy in the cervical, axillary or inguinal regions LUNGS: clear to auscultation b/l with normal respiratory effort HEART: regular rate & rhythm ABDOMEN:  normoactive bowel sounds , non tender, not distended.  No palpable hepatosplenomegaly.  Extremity: no pedal edema PSYCH: alert & oriented x 3 with fluent speech NEURO: no focal motor/sensory deficits  LABORATORY DATA:  I have reviewed the data as listed  . CBC Latest Ref Rng & Units 11/02/2018 08/12/2018 07/09/2018  WBC 4.0 - 10.5 K/uL 5.0 5.4 4.5  Hemoglobin 12.0 - 15.0 g/dL 10.7(L) 9.4(L) 9.6(L)  Hematocrit 36.0 - 46.0 % 34.0(L) 30.1(L) 28.0(L)  Platelets 150 - 400 K/uL 239 266 270.0   . CBC      Component Value Date/Time   WBC 5.0 11/02/2018 1233   RBC 3.81 (L) 11/02/2018 1233   HGB 10.7 (L) 11/02/2018 1233   HGB 11.1 04/13/2018 1057   HGB 12.0 09/12/2017 1045   HCT 34.0 (L) 11/02/2018 1233   HCT 35.8 04/13/2018 1057   HCT 37.6 09/12/2017 1045   PLT 239 11/02/2018 1233   PLT 281 04/13/2018 1057   MCV 89.2 11/02/2018 1233   MCV 85 04/13/2018 1057   MCV 91.9 09/12/2017 1045   MCH 28.1 11/02/2018 1233   MCHC 31.5 11/02/2018 1233   RDW 22.7 (H) 11/02/2018 1233   RDW 15.8 (H) 04/13/2018 1057   RDW 14.9 (H) 09/12/2017 1045   LYMPHSABS 0.9 11/02/2018 1233   LYMPHSABS 1.0 04/13/2018 1057   LYMPHSABS 1.2 09/12/2017 1045   MONOABS 0.4 11/02/2018 1233   MONOABS 0.5 09/12/2017 1045   EOSABS 0.2 11/02/2018 1233   EOSABS 0.1 04/13/2018 1057   BASOSABS 0.0 11/02/2018 1233   BASOSABS 0.0 04/13/2018 1057   BASOSABS 0.1 09/12/2017 1045    . CMP Latest Ref Rng & Units 11/02/2018 08/14/2018 08/12/2018  Glucose 70 - 99 mg/dL 89 - 85  BUN 8 - 23 mg/dL 14 - 14  Creatinine 0.44 - 1.00 mg/dL 0.85 - 0.85  Sodium 135 - 145 mmol/L 143 - 142  Potassium 3.5 - 5.1 mmol/L 4.1 - 3.8  Chloride 98 - 111 mmol/L 107 - 107  CO2 22 - 32 mmol/L 29 - 26  Calcium 8.9 - 10.3 mg/dL 9.3 - 9.1  Total Protein 6.5 - 8.1 g/dL 7.0 6.8 7.0  Total Bilirubin 0.3 - 1.2 mg/dL 0.3 0.3 0.3  Alkaline Phos 38 - 126 U/L 60 58 60  AST 15 - 41 U/L '25 16 20  ' ALT 0 - 44 U/L '22 13 16   ' . Lab Results  Component Value Date   LDH 171 11/02/2018    10/22/18 Duodenal Biopsy:   10/22/18 Tissue Flow Cytometry:  09/02/18 Duodenal Biopsy:    RADIOGRAPHIC STUDIES: I have personally reviewed the radiological images as listed and agreed with the findings in the report. No results found.  ASSESSMENT & PLAN:   63 y.o. female with  1. History of Stage IIA Invasive Ductal carcinoma, grade 2 or 3. Estrogen and progesterone receptor negative, with an MIB-1 of 38%. HER2 positive.  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  S/p Adjuvant radiation 06/28/15 through 08/11/15. Left breast 45Gy over 25 fractions. Left breast boost 16 Gy over 8 fractions.   2. Newly diagnosed Follicular Lymphoma involving the duodenal causing non healing bleeding ulcer, Grade 1 or 2  Colonoscopy 2017 - no evidence of lymphoma involvement. PLAN:  -Discussed patient's most recent labs from 08/12/18, anemic with HGB at 9.4 and MCV at 88.3. Creatinine normal. WBC normal at 5.4k, PLT normal at 266k.  -Discussed the 10/22/18 Duodenal biopsy  which revealed Grade 1-2 Follicular Lymphoma with a Ki7 less than 10% -Discussed the 06/29/18 CT A/P which revealed No evidence of bowel obstruction or inflammation. Appendix is normal. 2. Gallbladder wall is thickened and there is mild pericholecystic edema. No stones are identified but this could indicate cholecystitis in the appropriate clinical setting. Consider ultrasound for further evaluation if clinically indicated. 3. Prominent parapelvic cysts in the left kidney. No hydronephrosis or hydroureter. 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 -Reviewed last colonoscopy from September 2017 was normal except for hemorrhoids -Discussed the diagnosis, natural history, indications for treatment, possible treatments, and impending work up with the pt and her husband -Discussed that final treatment recommendation will follow the PET/CT, but will likely include Rituxan -Discussed that the pt does have the option to pursue a second opinion if she would prefer. She will look into a parallel timed second opinion at [facility], and will let me know if she needs any help with this -Continue 57m Protonix BID -Will order PET/CT for accurate staging and treatment planning -Will refer the pt to IR for BM Bx -Will order blood tests today as noted below -Will see the pt back in 2 weeks  . Orders Placed This  Encounter  Procedures  . NM PET Image Initial (PI) Skull Base To Thigh    Standing Status:   Future    Standing Expiration Date:   11/02/2019    Order Specific Question:   ** REASON FOR EXAM (FREE TEXT)    Answer:   Newly diagnosed follicular lymphoma for initial evaluation and staging    Order Specific Question:   If indicated for the ordered procedure, I authorize the administration of a radiopharmaceutical per Radiology protocol    Answer:   Yes    Order Specific Question:   [facility]   Order Specific Question:   Radiology Contrast Protocol - do NOT remove file path    Answer:   \\[email or web address]  . CT BONE MARROW BIOPSY & ASPIRATION    Standing Status:   Future    Standing Expiration Date:   02/01/2020    Order Specific Question:   Reason for Exam (SYMPTOM  OR DIAGNOSIS REQUIRED)    Answer:   Unilateral bone marrow aspiration and biopsy for initial staging of newly diagnosed follicular lymphoma    Order Specific Question:   [facility]   Order Specific Question:   Radiology Contrast Protocol - do NOT remove file path    Answer:   \\[email or web address].[email or web address]  . CT Biopsy    Standing Status:   Future    Standing Expiration Date:   11/02/2019    Order Specific Question:   Lab orders requested (DO NOT place separate lab orders, these will be automatically ordered during procedure specimen collection):    Answer:   Surgical Pathology    Comments:   flow cytometry, molecular studies as needed    Order Specific Question:   Reason for Exam (SYMPTOM  OR DIAGNOSIS REQUIRED)    Answer:   Unilateral bone marrow biopsy for initial staging of newly diagnosed follicular lymphoma    Order Specific Question:   [facility]   Order Specific Question:   Radiology Contrast Protocol - do NOT remove file path      Answer:   \\[email or web address].pdf  . CBC with  Differential/Platelet    Standing Status:   Future    Number of Occurrences:   1    Standing Expiration Date:   12/07/2019  . CMP ([place] only)    Standing Status:   Future    Number of Occurrences:   1    Standing Expiration Date:   11/03/2019  . Lactate dehydrogenase    Standing Status:   Future    Number of Occurrences:   1    Standing Expiration Date:   11/03/2019  . Hepatitis C antibody    Standing Status:   Future    Number of Occurrences:   1    Standing Expiration Date:   11/03/2019  . Hepatitis B core antibody, total    Standing Status:   Future    Number of Occurrences:   1    Standing Expiration Date:   11/02/2019  . Hepatitis B surface antigen    Standing Status:   Future    Number of Occurrences:   1    Standing Expiration Date:   11/02/2019  . Ferritin    Standing Status:   Future    Number of Occurrences:   1    Standing Expiration Date:   11/03/2019  . Iron and TIBC    Standing Status:   Future    Number of Occurrences:   1    Standing Expiration Date:   11/02/2019     Labs today PET/CT in 1 week CT bone marrow biopsy in 1week Chemo-counseling for Rituxan in 7-9 days Schedule to start Rituxan in 2 weeks with (labs and MD 1 day prior to treatment)   All of the patients questions were answered with apparent satisfaction. The patient knows to call the clinic with any problems, questions or concerns.  The total time spent in the appt was 60 minutes and more than 50% was on counseling and direct patient cares.    Sullivan Lone MD MS AAHIVMS [facility] Hematology/Oncology Physician [facility]  (Office):       [phone] (Work cell):  [phone] (Fax):           [phone]  11/02/2018 12:14 PM  I, Baldwin Jamaica, am acting as a scribe for Dr. Sullivan Lone.   I have reviewed the above documentation for accuracy and completeness, and I agree with the above. Brunetta Genera MD

## 2018-10-30 NOTE — Telephone Encounter (Signed)
A new patient appt has been scheduled for Mrs. Rhonda Gardner to see Dr. Irene Limbo on 1/20 at 11am. Pt agreed to the appt date and time.

## 2018-11-02 ENCOUNTER — Telehealth: Payer: Self-pay | Admitting: Hematology

## 2018-11-02 ENCOUNTER — Ambulatory Visit: Payer: BLUE CROSS/BLUE SHIELD | Admitting: Hematology

## 2018-11-02 ENCOUNTER — Inpatient Hospital Stay: Payer: BLUE CROSS/BLUE SHIELD

## 2018-11-02 ENCOUNTER — Other Ambulatory Visit: Payer: Self-pay | Admitting: *Deleted

## 2018-11-02 ENCOUNTER — Inpatient Hospital Stay: Payer: BLUE CROSS/BLUE SHIELD | Attending: Hematology | Admitting: Hematology

## 2018-11-02 VITALS — BP 138/79 | HR 90 | Temp 97.8°F | Resp 18 | Ht 67.0 in | Wt 148.0 lb

## 2018-11-02 DIAGNOSIS — Z171 Estrogen receptor negative status [ER-]: Secondary | ICD-10-CM | POA: Diagnosis not present

## 2018-11-02 DIAGNOSIS — C8209 Follicular lymphoma grade I, extranodal and solid organ sites: Secondary | ICD-10-CM

## 2018-11-02 DIAGNOSIS — Z923 Personal history of irradiation: Secondary | ICD-10-CM | POA: Diagnosis not present

## 2018-11-02 DIAGNOSIS — Z9221 Personal history of antineoplastic chemotherapy: Secondary | ICD-10-CM | POA: Diagnosis not present

## 2018-11-02 DIAGNOSIS — Z853 Personal history of malignant neoplasm of breast: Secondary | ICD-10-CM | POA: Diagnosis not present

## 2018-11-02 DIAGNOSIS — Z79899 Other long term (current) drug therapy: Secondary | ICD-10-CM | POA: Diagnosis not present

## 2018-11-02 DIAGNOSIS — D509 Iron deficiency anemia, unspecified: Secondary | ICD-10-CM

## 2018-11-02 LAB — CBC WITH DIFFERENTIAL/PLATELET
Abs Immature Granulocytes: 0.02 10*3/uL (ref 0.00–0.07)
BASOS ABS: 0 10*3/uL (ref 0.0–0.1)
Basophils Relative: 1 %
Eosinophils Absolute: 0.2 10*3/uL (ref 0.0–0.5)
Eosinophils Relative: 4 %
HCT: 34 % — ABNORMAL LOW (ref 36.0–46.0)
Hemoglobin: 10.7 g/dL — ABNORMAL LOW (ref 12.0–15.0)
Immature Granulocytes: 0 %
Lymphocytes Relative: 18 %
Lymphs Abs: 0.9 10*3/uL (ref 0.7–4.0)
MCH: 28.1 pg (ref 26.0–34.0)
MCHC: 31.5 g/dL (ref 30.0–36.0)
MCV: 89.2 fL (ref 80.0–100.0)
Monocytes Absolute: 0.4 10*3/uL (ref 0.1–1.0)
Monocytes Relative: 8 %
NEUTROS ABS: 3.4 10*3/uL (ref 1.7–7.7)
Neutrophils Relative %: 69 %
PLATELETS: 239 10*3/uL (ref 150–400)
RBC: 3.81 MIL/uL — ABNORMAL LOW (ref 3.87–5.11)
RDW: 22.7 % — ABNORMAL HIGH (ref 11.5–15.5)
WBC: 5 10*3/uL (ref 4.0–10.5)
nRBC: 0 % (ref 0.0–0.2)

## 2018-11-02 LAB — IRON AND TIBC
Iron: 111 ug/dL (ref 41–142)
Saturation Ratios: 30 % (ref 21–57)
TIBC: 369 ug/dL (ref 236–444)
UIBC: 258 ug/dL (ref 120–384)

## 2018-11-02 LAB — CMP (CANCER CENTER ONLY)
ALT: 22 U/L (ref 0–44)
AST: 25 U/L (ref 15–41)
Albumin: 4.2 g/dL (ref 3.5–5.0)
Alkaline Phosphatase: 60 U/L (ref 38–126)
Anion gap: 7 (ref 5–15)
BUN: 14 mg/dL (ref 8–23)
CO2: 29 mmol/L (ref 22–32)
Calcium: 9.3 mg/dL (ref 8.9–10.3)
Chloride: 107 mmol/L (ref 98–111)
Creatinine: 0.85 mg/dL (ref 0.44–1.00)
GFR, Est AFR Am: >60 mL/min (ref >60)
GFR, Estimated: >60 mL/min (ref >60)
Glucose, Bld: 89 mg/dL (ref 70–99)
POTASSIUM: 4.1 mmol/L (ref 3.5–5.1)
Sodium: 143 mmol/L (ref 135–145)
Total Bilirubin: 0.3 mg/dL (ref 0.3–1.2)
Total Protein: 7 g/dL (ref 6.5–8.1)

## 2018-11-02 LAB — FERRITIN: Ferritin: 30 ng/mL (ref 11–307)

## 2018-11-02 LAB — LACTATE DEHYDROGENASE: LDH: 171 U/L (ref 98–192)

## 2018-11-02 NOTE — Patient Instructions (Signed)
Thank you for choosing [facility] to provide your oncology and hematology care.  To afford each patient quality time with our providers, please arrive 30 minutes before your scheduled appointment time.  If you arrive late for your appointment, you may be asked to reschedule.  We strive to give you quality time with our providers, and arriving late affects you and other patients whose appointments are after yours.    If you are a no show for multiple scheduled visits, you may be dismissed from the clinic at the providers discretion.     Again, thank you for choosing [facility], our hope is that these requests will decrease the amount of time that you wait before being seen by our physicians.  ______________________________________________________________________   Should you have questions after your visit to the  Cancer Center, please contact our office at [phone] between the hours of 8:30 and 4:30 p.m.    Voicemails left after 4:30p.m will not be returned until the following business day.     For prescription refill requests, please have your pharmacy contact us directly.  Please also try to allow 48 hours for prescription requests.     Please contact the scheduling department for questions regarding scheduling.  For scheduling of procedures such as PET scans, CT scans, MRI, Ultrasound, etc please contact central scheduling at [phone].     Resources For Cancer Patients and Caregivers:    Oncolink.org:  A wonderful resource for patients and healthcare providers for information regarding your disease, ways to tract your treatment, what to expect, etc.      American Cancer Society:  800-227-2345  Can help patients locate various types of support and financial assistance   Cancer Care: 1-800-813-HOPE (4673) Provides financial assistance, online support groups, medication/co-pay assistance.     [place] DSS:  336-641-3447 Where to apply  for food stamps, Medicaid, and utility assistance   Medicare Rights Center: 800-333-4114 Helps people with Medicare understand their rights and benefits, navigate the Medicare system, and secure the quality healthcare they deserve   SCAT: 336-333-6589 [employer]'s shared-ride transportation service for eligible riders who have a disability that prevents them from riding the fixed route bus.     For additional information on assistance programs please contact our social worker:   Rhonda Gardner:  [phone]  

## 2018-11-02 NOTE — Telephone Encounter (Signed)
Scheduled appts per 01/20 los. Gave patient the number to central radiology.  Printed avs per patient request.

## 2018-11-03 LAB — HEPATITIS B CORE ANTIBODY, TOTAL: Hep B Core Total Ab: NEGATIVE

## 2018-11-03 LAB — HEPATITIS B SURFACE ANTIGEN: Hepatitis B Surface Ag: NEGATIVE

## 2018-11-03 LAB — HEPATITIS C ANTIBODY: HCV AB: 0.1 {s_co_ratio} (ref 0.0–0.9)

## 2018-11-04 ENCOUNTER — Telehealth: Payer: Self-pay | Admitting: *Deleted

## 2018-11-04 NOTE — Telephone Encounter (Signed)
At last office visit on 11/02/18, Dr Irene Limbo discussed that patient could seek 2nd opinion at [facility] if she would like.  Pt called today and wants to know if Dr Jana Hakim knows anyone at [facility] to sent her to. She would prefer [place] to send a referral for second opinion.  Please advise

## 2018-11-05 ENCOUNTER — Other Ambulatory Visit: Payer: Self-pay | Admitting: *Deleted

## 2018-11-06 ENCOUNTER — Other Ambulatory Visit: Payer: Self-pay | Admitting: *Deleted

## 2018-11-06 DIAGNOSIS — C8290 Follicular lymphoma, unspecified, unspecified site: Secondary | ICD-10-CM

## 2018-11-09 ENCOUNTER — Other Ambulatory Visit: Payer: Self-pay | Admitting: Oncology

## 2018-11-09 ENCOUNTER — Telehealth: Payer: Self-pay | Admitting: Hematology

## 2018-11-09 NOTE — Telephone Encounter (Signed)
Faxed medical records to [facility], Release [id number]

## 2018-11-10 ENCOUNTER — Inpatient Hospital Stay: Payer: BLUE CROSS/BLUE SHIELD

## 2018-11-10 ENCOUNTER — Other Ambulatory Visit: Payer: BLUE CROSS/BLUE SHIELD

## 2018-11-10 ENCOUNTER — Other Ambulatory Visit: Payer: Self-pay | Admitting: Radiology

## 2018-11-11 ENCOUNTER — Telehealth: Payer: Self-pay | Admitting: *Deleted

## 2018-11-11 ENCOUNTER — Encounter (HOSPITAL_COMMUNITY): Payer: Self-pay

## 2018-11-11 ENCOUNTER — Telehealth: Payer: Self-pay | Admitting: Hematology

## 2018-11-11 ENCOUNTER — Ambulatory Visit (HOSPITAL_COMMUNITY)
Admission: RE | Admit: 2018-11-11 | Discharge: 2018-11-11 | Disposition: A | Discharge status: Home / Self Care | Payer: BLUE CROSS/BLUE SHIELD | Source: Ambulatory Visit | Attending: Hematology | Admitting: Hematology

## 2018-11-11 DIAGNOSIS — Z8249 Family history of ischemic heart disease and other diseases of the circulatory system: Secondary | ICD-10-CM | POA: Insufficient documentation

## 2018-11-11 DIAGNOSIS — Z853 Personal history of malignant neoplasm of breast: Secondary | ICD-10-CM | POA: Diagnosis not present

## 2018-11-11 DIAGNOSIS — Z79899 Other long term (current) drug therapy: Secondary | ICD-10-CM | POA: Insufficient documentation

## 2018-11-11 DIAGNOSIS — D72819 Decreased white blood cell count, unspecified: Secondary | ICD-10-CM | POA: Insufficient documentation

## 2018-11-11 DIAGNOSIS — Z8711 Personal history of peptic ulcer disease: Secondary | ICD-10-CM | POA: Insufficient documentation

## 2018-11-11 DIAGNOSIS — Z8262 Family history of osteoporosis: Secondary | ICD-10-CM | POA: Insufficient documentation

## 2018-11-11 DIAGNOSIS — C8209 Follicular lymphoma grade I, extranodal and solid organ sites: Secondary | ICD-10-CM

## 2018-11-11 DIAGNOSIS — Z886 Allergy status to analgesic agent status: Secondary | ICD-10-CM | POA: Insufficient documentation

## 2018-11-11 DIAGNOSIS — Z881 Allergy status to other antibiotic agents status: Secondary | ICD-10-CM | POA: Diagnosis not present

## 2018-11-11 DIAGNOSIS — Z7983 Long term (current) use of bisphosphonates: Secondary | ICD-10-CM | POA: Diagnosis not present

## 2018-11-11 DIAGNOSIS — D649 Anemia, unspecified: Secondary | ICD-10-CM | POA: Diagnosis not present

## 2018-11-11 DIAGNOSIS — M810 Age-related osteoporosis without current pathological fracture: Secondary | ICD-10-CM | POA: Insufficient documentation

## 2018-11-11 DIAGNOSIS — Z7951 Long term (current) use of inhaled steroids: Secondary | ICD-10-CM | POA: Diagnosis not present

## 2018-11-11 DIAGNOSIS — Z800 Family history of malignant neoplasm of digestive organs: Secondary | ICD-10-CM | POA: Diagnosis not present

## 2018-11-11 LAB — CBC WITH DIFFERENTIAL/PLATELET
Abs Immature Granulocytes: 0.01 10*3/uL (ref 0.00–0.07)
Basophils Absolute: 0 10*3/uL (ref 0.0–0.1)
Basophils Relative: 0 %
Eosinophils Absolute: 0.2 10*3/uL (ref 0.0–0.5)
Eosinophils Relative: 6 %
HCT: 36.2 % (ref 36.0–46.0)
Hemoglobin: 11.4 g/dL — ABNORMAL LOW (ref 12.0–15.0)
Immature Granulocytes: 0 %
Lymphocytes Relative: 13 %
Lymphs Abs: 0.4 10*3/uL — ABNORMAL LOW (ref 0.7–4.0)
MCH: 29.4 pg (ref 26.0–34.0)
MCHC: 31.5 g/dL (ref 30.0–36.0)
MCV: 93.3 fL (ref 80.0–100.0)
Monocytes Absolute: 0.5 10*3/uL (ref 0.1–1.0)
Monocytes Relative: 16 %
Neutro Abs: 2.1 10*3/uL (ref 1.7–7.7)
Neutrophils Relative %: 65 %
Platelets: 205 10*3/uL (ref 150–400)
RBC: 3.88 MIL/uL (ref 3.87–5.11)
RDW: 20.9 % — ABNORMAL HIGH (ref 11.5–15.5)
WBC: 3.2 10*3/uL — ABNORMAL LOW (ref 4.0–10.5)
nRBC: 0 % (ref 0.0–0.2)

## 2018-11-11 LAB — PROTIME-INR
INR: 0.89
PROTHROMBIN TIME: 12 s (ref 11.4–15.2)

## 2018-11-11 MED ORDER — FENTANYL CITRATE (PF) 100 MCG/2ML IJ SOLN
INTRAMUSCULAR | Status: AC | PRN
  Administered 2018-11-11 (×2): 50 ug via INTRAVENOUS

## 2018-11-11 MED ORDER — MIDAZOLAM HCL 2 MG/2ML IJ SOLN
INTRAMUSCULAR | Status: AC | PRN
  Administered 2018-11-11 (×2): 1 mg via INTRAVENOUS

## 2018-11-11 MED ORDER — LIDOCAINE HCL (PF) 1 % IJ SOLN
INTRAMUSCULAR | Status: AC | PRN
  Administered 2018-11-11: 10 mL

## 2018-11-11 MED ORDER — FENTANYL CITRATE (PF) 100 MCG/2ML IJ SOLN
INTRAMUSCULAR | Status: AC
  Filled 2018-11-11: qty 2

## 2018-11-11 MED ORDER — SODIUM CHLORIDE 0.9 % IV SOLN
INTRAVENOUS | Status: DC
  Administered 2018-11-11: 09:00:00 via INTRAVENOUS

## 2018-11-11 MED ORDER — SODIUM CHLORIDE 0.9 % IV SOLN
8.0000 mg | Freq: Once | INTRAVENOUS | Status: AC
  Administered 2018-11-11: 8 mg via INTRAVENOUS
  Filled 2018-11-11: qty 4

## 2018-11-11 MED ORDER — MIDAZOLAM HCL 2 MG/2ML IJ SOLN
INTRAMUSCULAR | Status: AC
  Filled 2018-11-11: qty 4

## 2018-11-11 NOTE — Telephone Encounter (Signed)
R/s appt per 1/28 sch message - pt is aware of change from 2/5 to 2/6

## 2018-11-11 NOTE — Telephone Encounter (Signed)
Patient called 1/28 to report that her endodontist had prescribed amoxicillin 500 mg for a 7 day treatment. Will have completed prior to starting Rituxan next week. Dr. Irene Limbo informed.

## 2018-11-11 NOTE — Procedures (Signed)
Interventional Radiology Procedure Note ? ?Procedure: CT guided aspirate and core biopsy of right iliac bone ?Complications: None ?Recommendations: ?- Bedrest supine x 1 hrs ?- OTC's PRN  Pain ?- Follow biopsy results ? ?Signed, ? ?Tinlee Navarrette S. Franklin [place], DO ? ? ?

## 2018-11-11 NOTE — Discharge Instructions (Signed)
Bone Marrow Aspiration and Bone Marrow Biopsy, Adult, Care After This sheet gives you information about how to care for yourself after your procedure. Your health care provider may also give you more specific instructions. If you have problems or questions, contact your health care provider. What can I expect after the procedure? After the procedure, it is common to have:  Mild pain and tenderness.  Swelling.  Bruising. Follow these instructions at home: Puncture site care  Follow instructions from your health care provider about how to take care of the puncture site. Make sure you: ? Wash your hands with soap and water before you change your bandage (dressing). If soap and water are not available, use hand sanitizer. ? Change your dressing as told by your health care provider. ? May remove dressing and shower in 24 hours.  Keep site clean and dry.  Replace dressing with bandaid as necessary.  Check your puncture siteevery day for signs of infection. Check for: ? More redness, swelling, or pain. ? More fluid or blood. ? Warmth. ? Pus or a bad smell. General instructions  Take over-the-counter and prescription medicines only as told by your health care provider.  Do not take baths, swim, or use a hot tub until your health care provider approves. Ask if you can take a shower or have a sponge bath.  Return to your normal activities as told by your health care provider. Ask your health care provider what activities are safe for you.  Do not drive for 24 hours if you were given a medicine to help you relax (sedative) during your procedure.  Keep all follow-up visits as told by your health care provider. This is important. Contact a health care provider if:  Your pain is not controlled with medicine. Get help right away if:  You have a fever.  You have more redness, swelling, or pain around the puncture site.  You have more fluid or blood coming from the puncture site.  Your  puncture site feels warm to the touch.  You have pus or a bad smell coming from the puncture site. These symptoms may represent a serious problem that is an emergency. Do not wait to see if the symptoms will go away. Get medical help right away. Call your local emergency services (911 in the [place] Do not drive yourself to the hospital. Summary  After the procedure, it is common to have mild pain, tenderness, swelling, and bruising.  Follow instructions from your health care provider about how to take care of the puncture site.  Get help right away if you have any symptoms of infection or if you have more blood or fluid coming from the puncture site. This information is not intended to replace advice given to you by your health care provider. Make sure you discuss any questions you have with your health care provider. Document Released: 04/19/2005 Document Revised: 01/13/2018 Document Reviewed: 03/13/2016 Elsevier Interactive Patient Education  2019 [employer]. Moderate Conscious Sedation, Adult, Care After These instructions provide you with information about caring for yourself after your procedure. Your health care provider may also give you more specific instructions. Your treatment has been planned according to current medical practices, but problems sometimes occur. Call your health care provider if you have any problems or questions after your procedure. What can I expect after the procedure? After your procedure, it is common:  To feel sleepy for several hours.  To feel clumsy and have poor balance for several hours.  To have poor judgment for several hours.  To vomit if you eat too soon. Follow these instructions at home: For at least 24 hours after the procedure:   Do not: ? Participate in activities where you could fall or become injured. ? Drive. ? Use heavy machinery. ? Drink alcohol. ? Take sleeping pills or medicines that cause drowsiness. ? Make important  decisions or sign legal documents. ? Take care of children on your own.  Rest. Eating and drinking  Follow the diet recommended by your health care provider.  If you vomit: ? Drink water, juice, or soup when you can drink without vomiting. ? Make sure you have little or no nausea before eating solid foods. General instructions  Have a responsible adult stay with you until you are awake and alert.  Take over-the-counter and prescription medicines only as told by your health care provider.  If you smoke, do not smoke without supervision.  Keep all follow-up visits as told by your health care provider. This is important. Contact a health care provider if:  You keep feeling nauseous or you keep vomiting.  You feel light-headed.  You develop a rash.  You have a fever. Get help right away if:  You have trouble breathing. This information is not intended to replace advice given to you by your health care provider. Make sure you discuss any questions you have with your health care provider. Document Released: 07/21/2013 Document Revised: 03/04/2016 Document Reviewed: 01/20/2016 Elsevier Interactive Patient Education  2019 [employer].

## 2018-11-11 NOTE — H&P (Signed)
Chief Complaint: Follicular lymphoma  Referring Physician(s): Brunetta Genera  Supervising Physician: Corrie Mckusick  Patient Status: [facility] - Out-pt  History of Present Illness: Rhonda Gardner is a 63 y.o. female with history of stage IIA invasive ductal carcinoma.  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  She is here today for a bone marrow biopsy.  She is NPO. She feels well today. No nausea/vomiting. No Fever/chills. ROS negative.   Past Medical History:  Diagnosis Date  . Allergy 2006   chemical cauterization in spring 2006, and 2nd treatment with good results.(Dr.Shoemaker)  . Bipolar disorder ([place])   . Bleeding ulcer   . Blood transfusion without reported diagnosis 07/2017   Bleeding ulcer  . Breast cancer ([place]) 12/2014   invasive ductal carcinoma (LEFT)  . Breast cancer of upper-outer quadrant of left female breast ([place]) 12/22/2014  . Breast hematoma 12/19/2015   had left breast aspiration of 4cm hematoma-path indicates benign hematoma  . C. difficile colitis   . C. difficile diarrhea   . Depression   . Osteoporosis 06/20/2016   T-2.5'@spine'  on Dexa ([facility])  . Plantar fasciitis   . PONV (postoperative nausea and vomiting)   . Symptomatic anemia 07/16/2017    Past Surgical History:  Procedure Laterality Date  . BIOPSY  07/03/2018   Procedure: BIOPSY;  Surgeon: Gatha Mayer, MD;  Location: [facility];  Service: Endoscopy;;  . BIOPSY  09/02/2018   Procedure: BIOPSY;  Surgeon: Gatha Mayer, MD;  Location: [facility];  Service: Endoscopy;;  . BIOPSY  10/22/2018   Procedure: BIOPSY;  Surgeon: Milus Banister, MD;  Location: [facility];  Service: Endoscopy;;  . BREAST BIOPSY Bilateral    benign and a right stereotatic breast biopsy.  . COLONOSCOPY    . ESOPHAGOGASTRODUODENOSCOPY N/A 07/16/2017   Procedure: ESOPHAGOGASTRODUODENOSCOPY (EGD);  Surgeon: Ladene Artist, MD;  Location: [facility] ENDOSCOPY;  Service: Endoscopy;   Laterality: N/A;  . ESOPHAGOGASTRODUODENOSCOPY N/A 10/22/2018   Procedure: ESOPHAGOGASTRODUODENOSCOPY (EGD);  Surgeon: Milus Banister, MD;  Location: [facility];  Service: Endoscopy;  Laterality: N/A;  . ESOPHAGOGASTRODUODENOSCOPY (EGD) WITH PROPOFOL N/A 07/03/2018   Procedure: ESOPHAGOGASTRODUODENOSCOPY (EGD) WITH PROPOFOL;  Surgeon: Gatha Mayer, MD;  Location: [facility];  Service: Endoscopy;  Laterality: N/A;  . ESOPHAGOGASTRODUODENOSCOPY (EGD) WITH PROPOFOL N/A 09/02/2018   Procedure: ESOPHAGOGASTRODUODENOSCOPY (EGD) WITH PROPOFOL;  Surgeon: Gatha Mayer, MD;  Location: [facility];  Service: Endoscopy;  Laterality: N/A;  . EUS N/A 10/22/2018   Procedure: UPPER ENDOSCOPIC ULTRASOUND (EUS) RADIAL;  Surgeon: Milus Banister, MD;  Location: [facility];  Service: Endoscopy;  Laterality: N/A;  . fibroid excision  age 12   prolapsed fibroid removed  . FINE NEEDLE ASPIRATION  10/22/2018   Procedure: FINE NEEDLE ASPIRATION;  Surgeon: Milus Banister, MD;  Location: [facility];  Service: Endoscopy;;  . FOOT SURGERY Left 2014   Dr. Paulla Dolly  . FOOT SURGERY Right 08/2016   Dr. Paulla Dolly --Shortening Osteotomy with Fixation 1st Metatarsal   . MOUTH SURGERY  06/2017   [facility]   . PORT-A-CATH REMOVAL N/A 02/15/2016   Procedure: REMOVAL PORT-A-CATH;  Surgeon: Autumn Messing III, MD;  Location: [facility];  Service: General;  Laterality: N/A;  . RADIOACTIVE SEED GUIDED PARTIAL MASTECTOMY WITH AXILLARY SENTINEL LYMPH NODE BIOPSY Left 05/25/2015   Procedure: LEFT BREAST LUMPECTOMY WITH RADIOACTIVE SEED AND SENTINEL LYMPH NODE BIOPSY;  Surgeon: Autumn Messing III, MD;  Location: [facility];  Service: General;  Laterality: Left;  . UPPER GASTROINTESTINAL ENDOSCOPY      Allergies: Clindamycin/lincomycin; Nsaids; Other; and Adhesive [tape]  Medications: Prior to Admission medications   Medication Sig Start Date End Date Taking? Authorizing Provider  acetaminophen (TYLENOL) 325 MG  tablet Take 650 mg by mouth every 6 (six) hours as needed (for pain.).   Yes [provider]  ampicillin (PRINCIPEN) 250 MG capsule Take 250 mg by mouth 3 (three) times daily.   Yes [provider]  carboxymethylcellulose (REFRESH PLUS) 0.5 % SOLN Place 1 drop into both eyes 3 (three) times daily as needed (dry eyes.).   Yes [provider]  Cholecalciferol (VITAMIN D-3) 125 MCG (5000 UT) TABS Take 5,000 Units by mouth daily.   Yes [provider]  ferrous sulfate 325 (65 FE) MG EC tablet Take 1 tablet (325 mg total) by mouth 2 (two) times daily. 09/02/18 09/02/19 Yes Gatha Mayer, MD  fluticasone (FLONASE) 50 MCG/ACT nasal spray Place 1 spray into both nostrils daily.    Yes [provider]  LAMICTAL 200 MG tablet Take 0.5 tablets (100 mg total) by mouth daily. 07/16/18  Yes Rita Ohara, MD  Lutein-Zeaxanthin 25-5 MG CAPS Take 1 capsule by mouth daily.   Yes [provider]  MAGNESIUM CITRATE PO Take 420 mg by mouth daily.   Yes [provider]  ondansetron (ZOFRAN ODT) 4 MG disintegrating tablet Take 1 tablet (4 mg total) by mouth every 8 (eight) hours as needed for nausea. 06/30/18  Yes Larene Pickett, PA-C  pantoprazole (PROTONIX) 40 MG tablet Take 1 tablet (40 mg total) by mouth 2 (two) times daily. 07/16/18  Yes Esterwood, Amy S, PA-C  promethazine (PHENERGAN) 25 MG tablet Take 1 tablet (25 mg total) by mouth every 6 (six) hours as needed for nausea or vomiting. 01/07/18   Rita Ohara, MD  Zoledronic Acid (ZOMETA IV) Inject 1 Dose into the vein every 6 (six) months.     [provider]     Family History  Problem Relation Age of Onset  . Heart disease Father   . Skin cancer Father        non melanoma - farmer  . Hypertension Mother   . Arthritis Mother        osteoarthritis  . Macular degeneration Mother        wet and dry  . Colon cancer Mother 11       colon cancer at age 33  . Osteoporosis Sister         osteopenia (states it resolved)  . Lung cancer Maternal Aunt        lung cancer  . Cancer Paternal Aunt 70       ? stomach cancer  . Autism Sister   . Mental retardation Sister   . Blindness Sister        secondary to retrolental fibroplasia  . Osteoporosis Sister   . Goiter Paternal Aunt   . Diabetes Paternal Aunt   . Esophageal cancer Neg Hx   . Rectal cancer Neg Hx   . Stomach cancer Neg Hx     Social History   Socioeconomic History  . Marital status: Married    Spouse name: Not on file  . Number of children: Not on file  . Years of education: Not on file  . Highest education level: Not on file  Occupational History  . Not on file  Social Needs  . Financial resource strain: Not on file  .  Food insecurity:    Worry: Not on file    Inability: Not on file  . Transportation needs:    Medical: Not on file    Non-medical: Not on file  Tobacco Use  . Smoking status: Never Smoker  . Smokeless tobacco: Never Used  Substance and Sexual Activity  . Alcohol use: Yes    Alcohol/week: 0.0 standard drinks    Comment: 1-2 glasses of wine a month  . Drug use: No  . Sexual activity: Yes    Partners: Male    Comment: postmenopausal  Lifestyle  . Physical activity:    Days per week: Not on file    Minutes per session: Not on file  . Stress: Not on file  Relationships  . Social connections:    Talks on phone: Not on file    Gets together: Not on file    Attends religious service: Not on file    Active member of club or organization: Not on file    Attends meetings of clubs or organizations: Not on file    Relationship status: Not on file  Other Topics Concern  . Not on file  Social History Narrative   Married [name]), 1 dog (chocolate lab). Homemaker   Rare wine   No drugs, tobacco     Review of Systems: A 12 point ROS discussed and pertinent positives are indicated in the HPI above.  All other systems are negative.  Review of Systems  Vital Signs: BP 113/81 (BP  Location: Right Arm)   Pulse 83   Temp 97.8 F (36.6 C)   Resp 16   SpO2 100%   Physical Exam Vitals signs reviewed.  Constitutional:      Appearance: Normal appearance.  HENT:     Head: Normocephalic and atraumatic.  Eyes:     Extraocular Movements: Extraocular movements intact.  Neck:     Musculoskeletal: Normal range of motion.  Cardiovascular:     Rate and Rhythm: Normal rate and regular rhythm.  Pulmonary:     Effort: Pulmonary effort is normal.  Abdominal:     General: There is no distension.     Palpations: Abdomen is soft.     Tenderness: There is no abdominal tenderness.  Musculoskeletal: Normal range of motion.  Skin:    General: Skin is warm and dry.  Neurological:     General: No focal deficit present.     Mental Status: She is alert and oriented to person, place, and time.  Psychiatric:        Mood and Affect: Mood normal.        Behavior: Behavior normal.        Thought Content: Thought content normal.        Judgment: Judgment normal.     Imaging: No results found.  Labs:  CBC: Recent Labs    07/04/18 0448 07/09/18 1444 08/12/18 1330 11/02/18 1233  WBC 3.3* 4.5 5.4 5.0  HGB 9.3* 9.6* 9.4* 10.7*  HCT 28.4* 28.0* 30.1* 34.0*  PLT 226 270.0 266 239    COAGS: No results for input(s): INR, APTT in the last 8760 hours.  BMP: Recent Labs    06/29/18 1808 07/02/18 1728 08/12/18 1330 11/02/18 1233  NA 139 143 142 143  K 3.6 3.7 3.8 4.1  CL 105 109 107 107  CO2 '23 25 26 29  ' GLUCOSE 112* 95 85 89  BUN 27* '12 14 14  ' CALCIUM 8.8* 8.4* 9.1 9.3  CREATININE 0.85 0.71 0.85  0.85  GFRNONAA >60 >60 >60 >60  GFRAA >60 >60 >60 >60    LIVER FUNCTION TESTS: Recent Labs    07/09/18 1444 08/12/18 1330 08/14/18 1556 11/02/18 1233  BILITOT 0.3 0.3 0.3 0.3  AST '14 20 16 25  ' ALT '17 16 13 22  ' ALKPHOS 51 60 58 60  PROT 6.6 7.0 6.8 7.0  ALBUMIN 4.2 4.1 4.5 4.2    TUMOR MARKERS: No results for input(s): AFPTM, CEA, CA199, CHROMGRNA in the  last 8760 hours.  Assessment and Plan:  Follicular lymphoma.  Will proceed with bone marrow biopsy today by Dr. Earleen Newport.  Risks and benefits discussed with the patient including, but not limited to bleeding, infection, damage to adjacent structures or low yield requiring additional tests.  All of the patient's questions were answered, patient is agreeable to proceed. Consent signed and in chart.  Thank you for this interesting consult.  I greatly enjoyed meeting Herminia Warren Gardner and look forward to participating in their care.  A copy of this report was sent to the requesting provider on this date.  Electronically Signed: Murrell Redden, PA-C   11/11/2018, 9:38 AM      I spent a total of  30 Minutes in face to face in clinical consultation, greater than 50% of which was counseling/coordinating care for bone marrow biopsy.

## 2018-11-13 ENCOUNTER — Ambulatory Visit (HOSPITAL_COMMUNITY)
Admission: RE | Admit: 2018-11-13 | Discharge: 2018-11-13 | Disposition: A | Discharge status: Home / Self Care | Payer: BLUE CROSS/BLUE SHIELD | Source: Ambulatory Visit | Attending: Hematology | Admitting: Hematology

## 2018-11-13 DIAGNOSIS — C8209 Follicular lymphoma grade I, extranodal and solid organ sites: Secondary | ICD-10-CM | POA: Diagnosis not present

## 2018-11-13 LAB — GLUCOSE, CAPILLARY: Glucose-Capillary: 94 mg/dL (ref 70–99)

## 2018-11-13 MED ORDER — FLUDEOXYGLUCOSE F - 18 (FDG) INJECTION
7.5000 | Freq: Once | INTRAVENOUS | Status: AC | PRN
  Administered 2018-11-13: 7.5 via INTRAVENOUS

## 2018-11-16 ENCOUNTER — Other Ambulatory Visit: Payer: Self-pay | Admitting: Hematology

## 2018-11-16 DIAGNOSIS — C8290 Follicular lymphoma, unspecified, unspecified site: Secondary | ICD-10-CM | POA: Insufficient documentation

## 2018-11-16 NOTE — Progress Notes (Signed)
HEMATOLOGY/ONCOLOGY CONSULTATION NOTE  Date of Service: 11/17/2018  Patient Care Team: Rita Ohara, MD as PCP - General (Family Medicine) Magrinat, Virgie Dad, MD as Consulting Physician (Oncology) Thea Silversmith, MD as Consulting Physician (Radiation Oncology) Mauro Kaufmann, RN as Registered Nurse Rockwell Germany, RN as Registered Nurse Holley Bouche, NP (Inactive) as Nurse Practitioner (Nurse Practitioner) Jovita Kussmaul, MD as Consulting Physician (General Surgery)  CHIEF COMPLAINTS/PURPOSE OF CONSULTATION:  Newly Diagnosed Follicular Lymphoma  Oncologic History:    Malignant neoplasm of upper-outer quadrant of left breast in female, estrogen receptor negative ([place])   12/15/2014 Mammogram    A bilobed mass is identified in the left breast upper outer quadrant corresponding to the questioned palpable finding measuring approximately 2 cm.    12/15/2014 Breast US    Suspicious left breast mass 2 o'clock location    12/19/2014 Initial Biopsy    Left breast core needle bx: invasive ductal carcinoma, grade 2 or 3, ER- (0%), PR- (0%), HER2 equivocal but positive on further testing (ratio 3.4), Ki67 38%    12/19/2014 Clinical Stage    Stage IIA: T2 N0    12/27/2014 Breast MRI    2.5 cm enhancing left breast 2 o'clock location mass compatible with biopsy-proven malignancy. This directly abuts the left pectoralis major muscle and superficial muscle involvement is possible although there is no abnormal intramuscular enhancement    01/16/2015 - 04/21/2015 Neo-Adjuvant Chemotherapy    Carboplatin, docetaxel (changed to gemcitabine for final 2 cycles due to CIPN), trastuzumab, and pertuzumab x 6 cycles    05/2015 - 01/08/2016 Chemotherapy    Maintenance biotherapy with trastuzumab to complete one year of therapy    05/25/2015 Definitive Surgery    Left lumpectomy/SLNB: no residual invasive disease    05/25/2015 Pathologic Stage    ypT0 ypN0    06/28/2015 - 08/11/2015 Radiation Therapy    Left breast/ 45 Gy at 1.8 Gy per fraction x 25 fractions.  Left breast boost/ 16 Gy at 2 Gy per fraction x 8 fractions    04/02/2016 Survivorship    SCP visit completed and copy given to patient     Follicular lymphoma ([place])   11/16/2018 Initial Diagnosis    Follicular lymphoma ([place])    11/19/2018 -  Chemotherapy    The patient had riTUXimab (RITUXAN) 700 mg in sodium chloride 0.9 % 250 mL (2.1875 mg/mL) infusion, 375 mg/m2 = 700 mg, Intravenous,  Once, 0 of 4 cycles  for chemotherapy treatment.       HISTORY OF PRESENTING ILLNESS:   Rhonda Gardner is a wonderful 63 y.o. female who has been referred to [place] by Dr. Rita Ohara for evaluation and management of Follicular lymphoma. She is accompanied today by her husband. The pt reports that she is doing well overall.   Of note prior to today's visit, the pt was hospitalized between 07/02/18 and 07/04/18 after an EGD revealed an oozing, cratered duodenal ulcer, with benign pathology. Prior to this, the pt had taken omeprazole intermittently. The pt began 63m Protonix BID and then had a follow up EGD on 09/03/18, the pathology of which revealed atypical lymphoid tissue. The pt then had a repeat EUS/EGD on 10/22/18, the pathology of which revealed Follicular Lymphoma, grade 1 or 2.  The pt reports that she first was admitted in September or October 2018 for a bleeding duodenal ulcer. At the time she also developed C.diff. She did not have H.pylori and notes that there was suspicion at  this time, that the ulcer was "unusual looking." The pt is taking 23m Protonix BID at this time.   The pt notes that she has a significant, dull, belly-ache which comes and goes. The pt denies associations with onset, and denies this worsening, but endorses some improvement in the last few months. The pt is also taking iron supplements since November, and notes that her stools are occasionally more dark, and denies blood in the stools or black stools. She did have a  blood transfusion in the last 2 years. The pt notes that her last HGB was at 12 a couple weeks ago, on outside records that are not available.   The pt believes that she has had more fatigue since late 2018. She denies fevers, chills, night sweats, unexpected weight loss. She endorses stable weight. She denies changes in her eating habits.   The pt receives Zometa q633monthfor osteoporosis.  Of note prior to the patient's visit today, pt has had a CT A/P completed on 06/29/18 with results revealing No evidence of bowel obstruction or inflammation. Appendix is normal. 2. Gallbladder wall is thickened and there is mild pericholecystic edema. No stones are identified but this could indicate cholecystitis in the appropriate clinical setting. Consider ultrasound for further evaluation if clinically indicated. 3. Prominent parapelvic cysts in the left kidney. No hydronephrosis or hydroureter. 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  Most recent lab results (08/12/18) of CBC w/diff and CMP is as follows: all values are WNL except for RBC at 3.41, HGB at 9.4, HCT at 30.1, RDW at 16.2.  On review of systems, pt reports some fatigue, stable weight, stable eating habits, intermittent dull belly aches, and denies fevers, chills, night sweats, unexpected weight loss, blood in the stools, black stools, current abdominal pains, lower abdominal pains, leg swelling, noticing any new lumps or bumps, and any other symptoms.   On PMHx the pt reports Stage IIA invasive ductal carcinoma, bleeding ulcer, C.diff. On Social Hx the pt reports social ETOH consumption and denies ever smoking cigarettes On Family Hx the pt reports mother with late onset colon cancer.  Interval History:   Rhonda Gardner returns today for management and evaluation of her newly diagnosed follicular lymphoma. [name] last visit with [place] on 11/02/18. She is accompanied today by her husband. The pt reports that she is doing well  overall.  The pt reports that she has not developed any new concerns in the brief interim. She denies any blood in the stools and has been taking her PO Iron replacement. She endorses stable energy levels. She notes that she does not have abdominal pains. She denies black stools, but does endorse darker stools since beginning iron replacement.  The pt attended chemotherapy counseling in the interim. The pt will be seeing [facility] for her second opinion.  Of note since the patient's last visit, pt has had a PET/CT completed on 11/13/18 with results revealing 1. Scattered mesenteric lymph nodes are accentuated in number but not overtly pathologic in size, and demonstrate Deauville 4 and Deauville 3 levels of activity. 2. Small left supraclavicular lymph nodes demonstrate Deauville 3 activity. 3. Normal spleen. 4. Mild asymmetric tonsillar activity, most likely physiologic. 5. Mild chronic left maxillary sinusitis. 6. 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  Lab results today (11/17/18) of CBC w/diff and CMP is as follows: all values are WNL except for RBC at 3.78, HGB at 10.9, HCT at  34.5, RDW at 18.7.  On review of systems, pt reports stable energy levels, and denies blood in the stools, abdominal pains, black stools, and any other symptoms.  MEDICAL HISTORY:  Past Medical History:  Diagnosis Date  . Allergy 2006   chemical cauterization in spring 2006, and 2nd treatment with good results.(Dr.Shoemaker)  . Bipolar disorder ([place])   . Bleeding ulcer   . Blood transfusion without reported diagnosis 07/2017   Bleeding ulcer  . Breast cancer ([place]) 12/2014   invasive ductal carcinoma (LEFT)  . Breast cancer of upper-outer quadrant of left female breast ([place]) 12/22/2014  . Breast hematoma 12/19/2015   had left breast aspiration of 4cm hematoma-path indicates benign hematoma  . C. difficile colitis   . C. difficile diarrhea   . Depression   .  Osteoporosis 06/20/2016   T-2.5'@spine'  on Dexa ([facility])  . Plantar fasciitis   . PONV (postoperative nausea and vomiting)   . Symptomatic anemia 07/16/2017    SURGICAL HISTORY: Past Surgical History:  Procedure Laterality Date  . BIOPSY  07/03/2018   Procedure: BIOPSY;  Surgeon: Gatha Mayer, MD;  Location: [facility];  Service: Endoscopy;;  . BIOPSY  09/02/2018   Procedure: BIOPSY;  Surgeon: Gatha Mayer, MD;  Location: [facility];  Service: Endoscopy;;  . BIOPSY  10/22/2018   Procedure: BIOPSY;  Surgeon: Milus Banister, MD;  Location: [facility];  Service: Endoscopy;;  . BREAST BIOPSY Bilateral    benign and a right stereotatic breast biopsy.  . COLONOSCOPY    . ESOPHAGOGASTRODUODENOSCOPY N/A 07/16/2017   Procedure: ESOPHAGOGASTRODUODENOSCOPY (EGD);  Surgeon: Ladene Artist, MD;  Location: [facility] ENDOSCOPY;  Service: Endoscopy;  Laterality: N/A;  . ESOPHAGOGASTRODUODENOSCOPY N/A 10/22/2018   Procedure: ESOPHAGOGASTRODUODENOSCOPY (EGD);  Surgeon: Milus Banister, MD;  Location: [facility];  Service: Endoscopy;  Laterality: N/A;  . ESOPHAGOGASTRODUODENOSCOPY (EGD) WITH PROPOFOL N/A 07/03/2018   Procedure: ESOPHAGOGASTRODUODENOSCOPY (EGD) WITH PROPOFOL;  Surgeon: Gatha Mayer, MD;  Location: [facility];  Service: Endoscopy;  Laterality: N/A;  . ESOPHAGOGASTRODUODENOSCOPY (EGD) WITH PROPOFOL N/A 09/02/2018   Procedure: ESOPHAGOGASTRODUODENOSCOPY (EGD) WITH PROPOFOL;  Surgeon: Gatha Mayer, MD;  Location: [facility];  Service: Endoscopy;  Laterality: N/A;  . EUS N/A 10/22/2018   Procedure: UPPER ENDOSCOPIC ULTRASOUND (EUS) RADIAL;  Surgeon: Milus Banister, MD;  Location: [facility];  Service: Endoscopy;  Laterality: N/A;  . fibroid excision  age 18   prolapsed fibroid removed  . FINE NEEDLE ASPIRATION  10/22/2018   Procedure: FINE NEEDLE ASPIRATION;  Surgeon: Milus Banister, MD;  Location: [facility];  Service: Endoscopy;;  . FOOT SURGERY Left 2014   Dr. Paulla Dolly  .  FOOT SURGERY Right 08/2016   Dr. Paulla Dolly --Shortening Osteotomy with Fixation 1st Metatarsal   . MOUTH SURGERY  06/2017   [facility]   . PORT-A-CATH REMOVAL N/A 02/15/2016   Procedure: REMOVAL PORT-A-CATH;  Surgeon: Autumn Messing III, MD;  Location: [facility];  Service: General;  Laterality: N/A;  . RADIOACTIVE SEED GUIDED PARTIAL MASTECTOMY WITH AXILLARY SENTINEL LYMPH NODE BIOPSY Left 05/25/2015   Procedure: LEFT BREAST LUMPECTOMY WITH RADIOACTIVE SEED AND SENTINEL LYMPH NODE BIOPSY;  Surgeon: Autumn Messing III, MD;  Location: [facility];  Service: General;  Laterality: Left;  . UPPER GASTROINTESTINAL ENDOSCOPY      SOCIAL HISTORY: Social History   Socioeconomic History  . Marital status: Married    Spouse name: Not on file  . Number of children: Not on file  . Years  of education: Not on file  . Highest education level: Not on file  Occupational History  . Not on file  Social Needs  . Financial resource strain: Not on file  . Food insecurity:    Worry: Not on file    Inability: Not on file  . Transportation needs:    Medical: Not on file    Non-medical: Not on file  Tobacco Use  . Smoking status: Never Smoker  . Smokeless tobacco: Never Used  Substance and Sexual Activity  . Alcohol use: Yes    Alcohol/week: 0.0 standard drinks    Comment: 1-2 glasses of wine a month  . Drug use: No  . Sexual activity: Yes    Partners: Male    Comment: postmenopausal  Lifestyle  . Physical activity:    Days per week: Not on file    Minutes per session: Not on file  . Stress: Not on file  Relationships  . Social connections:    Talks on phone: Not on file    Gets together: Not on file    Attends religious service: Not on file    Active member of club or organization: Not on file    Attends meetings of clubs or organizations: Not on file    Relationship status: Not on file  . Intimate partner violence:    Fear of current or ex partner: Not on file    Emotionally  abused: Not on file    Physically abused: Not on file    Forced sexual activity: Not on file  Other Topics Concern  . Not on file  Social History Narrative   Married Rhonda Gardner), 1 dog (chocolate lab). Homemaker   Rare wine   No drugs, tobacco    FAMILY HISTORY: Family History  Problem Relation Age of Onset  . Heart disease Father   . Skin cancer Father        non melanoma - farmer  . Hypertension Mother   . Arthritis Mother        osteoarthritis  . Macular degeneration Mother        wet and dry  . Colon cancer Mother 59       colon cancer at age 66  . Osteoporosis Sister        osteopenia (states it resolved)  . Lung cancer Maternal Aunt        lung cancer  . Cancer Paternal Aunt 59       ? stomach cancer  . Autism Sister   . Mental retardation Sister   . Blindness Sister        secondary to retrolental fibroplasia  . Osteoporosis Sister   . Goiter Paternal Aunt   . Diabetes Paternal Aunt   . Esophageal cancer Neg Hx   . Rectal cancer Neg Hx   . Stomach cancer Neg Hx     ALLERGIES:  is allergic to clindamycin/lincomycin; nsaids; other; and adhesive [tape].  MEDICATIONS:  Current Outpatient Medications  Medication Sig Dispense Refill  . acetaminophen (TYLENOL) 325 MG tablet Take 650 mg by mouth every 6 (six) hours as needed (for pain.).    Marland Kitchen ampicillin (PRINCIPEN) 250 MG capsule Take 250 mg by mouth 3 (three) times daily.    . carboxymethylcellulose (REFRESH PLUS) 0.5 % SOLN Place 1 drop into both eyes 3 (three) times daily as needed (dry eyes.).    Marland Kitchen Cholecalciferol (VITAMIN D-3) 125 MCG (5000 UT) TABS Take 5,000 Units by mouth daily.    Marland Kitchen  ferrous sulfate 325 (65 FE) MG EC tablet Take 1 tablet (325 mg total) by mouth 2 (two) times daily. 60 tablet 3  . fluticasone (FLONASE) 50 MCG/ACT nasal spray Place 1 spray into both nostrils daily.     Marland Kitchen LAMICTAL 200 MG tablet Take 0.5 tablets (100 mg total) by mouth daily. 45 tablet 0  . Lutein-Zeaxanthin 25-5 MG CAPS Take 1  capsule by mouth daily.    Marland Kitchen MAGNESIUM CITRATE PO Take 420 mg by mouth daily.    . ondansetron (ZOFRAN ODT) 4 MG disintegrating tablet Take 1 tablet (4 mg total) by mouth every 8 (eight) hours as needed for nausea. 10 tablet 0  . pantoprazole (PROTONIX) 40 MG tablet Take 1 tablet (40 mg total) by mouth 2 (two) times daily. 60 tablet 6  . promethazine (PHENERGAN) 25 MG tablet Take 1 tablet (25 mg total) by mouth every 6 (six) hours as needed for nausea or vomiting. 30 tablet 0  . Zoledronic Acid (ZOMETA IV) Inject 1 Dose into the vein every 6 (six) months.      No current facility-administered medications for this visit.     REVIEW OF SYSTEMS:    A 10+ POINT REVIEW OF SYSTEMS WAS OBTAINED including neurology, dermatology, psychiatry, cardiac, respiratory, lymph, extremities, GI, GU, Musculoskeletal, constitutional, breasts, reproductive, HEENT.  All pertinent positives are noted in the HPI.  All others are negative.   PHYSICAL EXAMINATION: ECOG PERFORMANCE STATUS: 1 - Symptomatic but completely ambulatory  . Vitals:   11/17/18 1515  BP: 123/73  Pulse: 80  Resp: 18  Temp: 98.8 F (37.1 C)  SpO2: (!) 80%   Filed Weights   11/17/18 1515  Weight: 142 lb 14.4 oz (64.8 kg)   .Body mass index is 22.38 kg/m.  GENERAL:alert, in no acute distress and comfortable SKIN: no acute rashes, no significant lesions EYES: conjunctiva are pink and non-injected, sclera anicteric OROPHARYNX: MMM, no exudates, no oropharyngeal erythema or ulceration NECK: supple, no JVD LYMPH:  no palpable lymphadenopathy in the cervical, axillary or inguinal regions LUNGS: clear to auscultation b/l with normal respiratory effort HEART: regular rate & rhythm ABDOMEN:  normoactive bowel sounds , non tender, not distended. No palpable hepatosplenomegaly.  Extremity: no pedal edema PSYCH: alert & oriented x 3 with fluent speech NEURO: no focal motor/sensory deficits   LABORATORY DATA:  I have reviewed the data  as listed  . CBC Latest Ref Rng & Units 11/17/2018 11/11/2018 11/02/2018  WBC 4.0 - 10.5 K/uL 4.6 3.2(L) 5.0  Hemoglobin 12.0 - 15.0 g/dL 10.9(L) 11.4(L) 10.7(L)  Hematocrit 36.0 - 46.0 % 34.5(L) 36.2 34.0(L)  Platelets 150 - 400 K/uL 234 205 239   . CBC    Component Value Date/Time   WBC 4.6 11/17/2018 1439   RBC 3.78 (L) 11/17/2018 1439   HGB 10.9 (L) 11/17/2018 1439   HGB 11.1 04/13/2018 1057   HGB 12.0 09/12/2017 1045   HCT 34.5 (L) 11/17/2018 1439   HCT 35.8 04/13/2018 1057   HCT 37.6 09/12/2017 1045   PLT 234 11/17/2018 1439   PLT 281 04/13/2018 1057   MCV 91.3 11/17/2018 1439   MCV 85 04/13/2018 1057   MCV 91.9 09/12/2017 1045   MCH 28.8 11/17/2018 1439   MCHC 31.6 11/17/2018 1439   RDW 18.7 (H) 11/17/2018 1439   RDW 15.8 (H) 04/13/2018 1057   RDW 14.9 (H) 09/12/2017 1045   LYMPHSABS 1.0 11/17/2018 1439   LYMPHSABS 1.0 04/13/2018 1057   LYMPHSABS 1.2 09/12/2017 1045  MONOABS 0.5 11/17/2018 1439   MONOABS 0.5 09/12/2017 1045   EOSABS 0.1 11/17/2018 1439   EOSABS 0.1 04/13/2018 1057   BASOSABS 0.0 11/17/2018 1439   BASOSABS 0.0 04/13/2018 1057   BASOSABS 0.1 09/12/2017 1045    . CMP Latest Ref Rng & Units 11/17/2018 11/02/2018 08/14/2018  Glucose 70 - 99 mg/dL 88 89 -  BUN 8 - 23 mg/dL 13 14 -  Creatinine 0.44 - 1.00 mg/dL 0.81 0.85 -  Sodium 135 - 145 mmol/L 140 143 -  Potassium 3.5 - 5.1 mmol/L 3.9 4.1 -  Chloride 98 - 111 mmol/L 105 107 -  CO2 22 - 32 mmol/L 29 29 -  Calcium 8.9 - 10.3 mg/dL 9.2 9.3 -  Total Protein 6.5 - 8.1 g/dL 6.6 7.0 6.8  Total Bilirubin 0.3 - 1.2 mg/dL 0.4 0.3 0.3  Alkaline Phos 38 - 126 U/L 48 60 58  AST 15 - 41 U/L '20 25 16  ' ALT 0 - 44 U/L '15 22 13   ' . Lab Results  Component Value Date   LDH 171 11/02/2018    10/22/18 Duodenal Biopsy:   10/22/18 Tissue Flow Cytometry:  09/02/18 Duodenal Biopsy:   11/11/18 BM Biopsy:   11/11/18 BM Flow Cytometry:    RADIOGRAPHIC STUDIES: I have personally reviewed the radiological  images as listed and agreed with the findings in the report. Nm Pet Image Initial (pi) Skull Base To Thigh  Result Date: 11/13/2018 CLINICAL DATA:  Initial treatment strategy for follicular lymphoma. EXAM: NUCLEAR MEDICINE PET SKULL BASE TO THIGH TECHNIQUE: 7.5 mCi F-18 FDG was injected intravenously. Full-ring PET imaging was performed from the skull base to thigh after the radiotracer. CT data was obtained and used for attenuation correction and anatomic localization. Fasting blood glucose: 94 mg/dl COMPARISON:  06/29/2018 CT abdomen. FINDINGS: Mediastinal blood pool activity: SUV max 2.0 Background hepatic activity: SUV max 2.8 NECK: Mildly asymmetric tonsillar activity, right tonsil maximum SUV 6.4, left tonsil 4.7. No CT correlate. Incidental CT findings: Mild chronic left maxillary sinusitis. CHEST: Small left supraclavicular nodes with individual nodes measuring up to 0.6 cm in short axis, and maximum SUV of 2.3, Deauville 3 activity. Low-grade thyroid activity potentially from mild thyroiditis. Incidental CT findings: Clips in the left axilla and along the left lateral breast. Biapical pleuroparenchymal scarring. Subpleural reticulation anteriorly in the left lung favoring prior radiation therapy for breast cancer. ABDOMEN/PELVIS: An upper mesenteric node measuring 7 mm in short axis on image 121/4 maximum SUV of 5.1, Deauville 4. Other clustered Deauville 3 and Deauville 4 small mesenteric lymph nodes are appreciated. No splenomegaly or focal splenic lesion is observed. Physiologic activity in the bowel without CT correlate. Incidental CT findings: Left renal peripelvic cysts. Rim calcified splenic artery aneurysm 1.2 cm in diameter on image 104/4. SKELETON: No significant abnormal hypermetabolic activity in this region. Incidental CT findings: none IMPRESSION: 1. Scattered mesenteric lymph nodes are accentuated in number but not overtly pathologic in size, and demonstrate Deauville 4 and Deauville 3  levels of activity. 2. Small left supraclavicular lymph nodes demonstrate Deauville 3 activity. 3. Normal spleen. 4. Mild asymmetric tonsillar activity, most likely physiologic. 5. Mild chronic left maxillary sinusitis. 6. 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 Unless this area is incidentally imaged in the context of other imaging, I would recommend follow up CT abdomen or CT angiography of the abdomen in 1 years time to assess for stability. This recommendation follows ACR consensus guidelines:  White Paper of the ACR Incidental Findings Committee II on Vascular Findings. J Am Coll Radiol [phone]. Electronically Signed   By: Van Clines M.D.   On: 11/13/2018 13:48   Ct Biopsy  Result Date: 11/11/2018 INDICATION: 63 year old female with a history of follicular lymphoma EXAM: CT BONE MARROW BIOPSY AND ASPIRATION; CT BIOPSY MEDICATIONS: None. ANESTHESIA/SEDATION: Moderate (conscious) sedation was employed during this procedure. A total of Versed 2.0 mg and Fentanyl 100 mcg was administered intravenously. Moderate Sedation Time: 10 minutes. [name] level of consciousness and vital signs were monitored continuously by radiology nursing throughout the procedure under my direct supervision. FLUOROSCOPY TIME:  CT COMPLICATIONS: None PROCEDURE: The procedure risks, benefits, and alternatives were explained to the patient. Questions regarding the procedure were encouraged and answered. The patient understands and consents to the procedure. Scout CT of the pelvis was performed for surgical planning purposes. The posterior pelvis was prepped with Chlorhexidine in a sterile fashion, and a sterile drape was applied covering the operative field. A sterile gown and sterile gloves were used for the procedure. Local anesthesia was provided with 1% Lidocaine. Posterior iliac bone was targeted for biopsy. The skin and subcutaneous tissues were infiltrated with 1%  lidocaine without epinephrine. A small stab incision was made with an 11 blade scalpel, and an 11 gauge Murphy needle was advanced with CT guidance to the posterior cortex. Manual forced was used to advance the needle through the posterior cortex and the stylet was removed. A bone marrow aspirate was retrieved and passed to a cytotechnologist in the room. The Murphy needle was then advanced without the stylet for a core biopsy. The core biopsy was retrieved and also passed to a cytotechnologist. Manual pressure was used for hemostasis and a sterile dressing was placed. No complications were encountered no significant blood loss was encountered. Patient tolerated the procedure well and remained hemodynamically stable throughout. IMPRESSION: Status post CT-guided bone marrow biopsy, with tissue specimen sent to pathology for complete histopathologic analysis Signed, Dulcy Fanny. Earleen Newport, DO [facility] Radiology Electronically Signed   By: Corrie Mckusick D.O.   On: 11/11/2018 12:11   Ct Bone Marrow Biopsy & Aspiration  Result Date: 11/11/2018 INDICATION: 63 year old female with a history of follicular lymphoma EXAM: CT BONE MARROW BIOPSY AND ASPIRATION; CT BIOPSY MEDICATIONS: None. ANESTHESIA/SEDATION: Moderate (conscious) sedation was employed during this procedure. A total of Versed 2.0 mg and Fentanyl 100 mcg was administered intravenously. Moderate Sedation Time: 10 minutes. [name] level of consciousness and vital signs were monitored continuously by radiology nursing throughout the procedure under my direct supervision. FLUOROSCOPY TIME:  CT COMPLICATIONS: None PROCEDURE: The procedure risks, benefits, and alternatives were explained to the patient. Questions regarding the procedure were encouraged and answered. The patient understands and consents to the procedure. Scout CT of the pelvis was performed for surgical planning purposes. The posterior pelvis was  prepped with Chlorhexidine in a sterile fashion, and a sterile drape was applied covering the operative field. A sterile gown and sterile gloves were used for the procedure. Local anesthesia was provided with 1% Lidocaine. Posterior iliac bone was targeted for biopsy. The skin and subcutaneous tissues were infiltrated with 1% lidocaine without epinephrine. A small stab incision was made with an 11 blade scalpel, and an 11 gauge Murphy needle was advanced with CT guidance to the posterior cortex. Manual forced was used to advance the needle through the posterior cortex and the stylet was removed. A bone marrow aspirate was retrieved  and passed to a cytotechnologist in the room. The Murphy needle was then advanced without the stylet for a core biopsy. The core biopsy was retrieved and also passed to a cytotechnologist. Manual pressure was used for hemostasis and a sterile dressing was placed. No complications were encountered no significant blood loss was encountered. Patient tolerated the procedure well and remained hemodynamically stable throughout. IMPRESSION: Status post CT-guided bone marrow biopsy, with tissue specimen sent to pathology for complete histopathologic analysis Signed, Dulcy Fanny. Earleen Newport, DO [facility] Radiology Electronically Signed   By: Corrie Mckusick D.O.   On: 11/11/2018 12:11    ASSESSMENT & PLAN:   63 y.o. female with  1. History of Stage IIA Invasive Ductal carcinoma, grade 2 or 3. Estrogen and progesterone receptor negative, with an MIB-1 of 38%. HER2 positive.  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  S/p Adjuvant radiation 06/28/15 through 08/11/15. Left breast 45Gy over 25 fractions. Left breast boost 16 Gy over 8 fractions.   2. 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  Labs upon initial presentation from 08/12/18, anemic with HGB at 9.4 and MCV at 88.3. Creatinine normal. WBC normal at 5.4k, PLT normal at 266k.  10/22/18 Duodenal biopsy revealed Grade 1-2 Follicular Lymphoma with a Ki7 less than 10%  06/29/18 CT A/P revealed No evidence of bowel obstruction or inflammation. Appendix is normal. 2. Gallbladder wall is thickened and there is mild pericholecystic edema. No stones are identified but this could indicate cholecystitis in the appropriate clinical setting. Consider ultrasound for further evaluation if clinically indicated. 3. Prominent parapelvic cysts in the left kidney. No hydronephrosis or hydroureter. 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  PLAN: -Discussed pt labwork today, 11/17/18; blood counts and chemistries are stable. 11/02/18 Ferritin at 30. -Discussed the 11/13/18 PET/CT which revealed 1. Scattered mesenteric lymph nodes are accentuated in number but not overtly pathologic in size, and demonstrate Deauville 4 and Deauville 3 levels of activity. 2. Small left supraclavicular lymph nodes demonstrate Deauville 3 activity. 3. Normal spleen. 4. Mild asymmetric tonsillar activity, most likely physiologic. 5. Mild chronic left maxillary sinusitis. 6. 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 -Discussed the 11/11/18 BM Bx pathology report which revealed "rare minute lymphoid aggregates." but no overt lymphoma. -Discussed the 11/11/18 BM Flow cytometry which did not reveal involvement by a monoclonal B-cell population nor an abnormal phenotypic T-cell population -discussed treatment options including RT vs Single agent Rituxan  -Besides the known duodenal ulceration, there is borderline activity seen in the PET/CT. Overall small volume disease.  -Pt will be beginning 4 weekly cycles of  Rituxan on 11/19/18 based on patients preference. -Recommend repeat endoscopy 8-12 weeks after completing treatment -Previously discussed that the pt does have the option to pursue a second opinion if she would prefer. She will see [facility] tomorrow on 11/18/18 -Continue 48m Protonix BID -Continue PO Iron replacement  -Will see the pt back in 2 weeks   Orders Placed This Encounter  Procedures  . CBC with Differential/Platelet    Standing Status:   Standing    Number of Occurrences:   4    Standing Expiration Date:   11/18/2019  . CMP (Cancer  Center only)    Standing Status:   Standing    Number of Occurrences:   4    Standing Expiration Date:   11/18/2019    Please schedule all 4 doses of weekly Rituxan with labs RTC with Dr Irene Limbo with 2nd dose of Rituxan   All of the patients questions were answered with apparent satisfaction. The patient knows to call the clinic with any problems, questions or concerns.  The total time spent in the appt was 35 minutes and more than 50% was on counseling and direct patient cares.    Sullivan Lone MD MS AAHIVMS [facility] Hematology/Oncology Physician [facility]  (Office):       [phone] (Work cell):  [phone] (Fax):           [phone]  11/17/2018 4:08 PM  I, Baldwin Jamaica, am acting as a scribe for Dr. Sullivan Lone.   .I have reviewed the above documentation for accuracy and completeness, and I agree with the above. Brunetta Genera MD

## 2018-11-16 NOTE — Progress Notes (Signed)
Patient on plan of care prior to pathways. 

## 2018-11-16 NOTE — Progress Notes (Signed)
START ON PATHWAY REGIMEN - Lymphoma and CLL     Administer weekly:     Rituximab   **Always confirm dose/schedule in your pharmacy ordering system**  Patient Characteristics: Follicular Lymphoma, Grades 1, 2, and 3A, First Line, Stage I / II Disease Type: Follicular Lymphoma Disease Type: Not Applicable Disease Type: Not Applicable [place] Stage: IE Tumor Grade: 2 Line of Therapy: First Line Intent of Therapy: Non-Curative / Palliative Intent, Discussed with Patient

## 2018-11-17 ENCOUNTER — Inpatient Hospital Stay: Payer: BLUE CROSS/BLUE SHIELD | Attending: Hematology

## 2018-11-17 ENCOUNTER — Inpatient Hospital Stay (HOSPITAL_BASED_OUTPATIENT_CLINIC_OR_DEPARTMENT_OTHER): Payer: BLUE CROSS/BLUE SHIELD | Admitting: Hematology

## 2018-11-17 VITALS — BP 123/73 | HR 80 | Temp 98.8°F | Resp 18 | Ht 67.0 in | Wt 142.9 lb

## 2018-11-17 DIAGNOSIS — Z171 Estrogen receptor negative status [ER-]: Secondary | ICD-10-CM | POA: Insufficient documentation

## 2018-11-17 DIAGNOSIS — Z9221 Personal history of antineoplastic chemotherapy: Secondary | ICD-10-CM | POA: Insufficient documentation

## 2018-11-17 DIAGNOSIS — Z853 Personal history of malignant neoplasm of breast: Secondary | ICD-10-CM

## 2018-11-17 DIAGNOSIS — Z923 Personal history of irradiation: Secondary | ICD-10-CM | POA: Diagnosis not present

## 2018-11-17 DIAGNOSIS — C8209 Follicular lymphoma grade I, extranodal and solid organ sites: Secondary | ICD-10-CM

## 2018-11-17 DIAGNOSIS — C8290 Follicular lymphoma, unspecified, unspecified site: Secondary | ICD-10-CM

## 2018-11-17 DIAGNOSIS — C50412 Malignant neoplasm of upper-outer quadrant of left female breast: Secondary | ICD-10-CM

## 2018-11-17 LAB — CBC WITH DIFFERENTIAL/PLATELET
Abs Immature Granulocytes: 0.01 10*3/uL (ref 0.00–0.07)
BASOS PCT: 1 %
Basophils Absolute: 0 10*3/uL (ref 0.0–0.1)
Eosinophils Absolute: 0.1 10*3/uL (ref 0.0–0.5)
Eosinophils Relative: 2 %
HCT: 34.5 % — ABNORMAL LOW (ref 36.0–46.0)
Hemoglobin: 10.9 g/dL — ABNORMAL LOW (ref 12.0–15.0)
Immature Granulocytes: 0 %
Lymphocytes Relative: 22 %
Lymphs Abs: 1 10*3/uL (ref 0.7–4.0)
MCH: 28.8 pg (ref 26.0–34.0)
MCHC: 31.6 g/dL (ref 30.0–36.0)
MCV: 91.3 fL (ref 80.0–100.0)
MONOS PCT: 11 %
Monocytes Absolute: 0.5 10*3/uL (ref 0.1–1.0)
Neutro Abs: 3 10*3/uL (ref 1.7–7.7)
Neutrophils Relative %: 64 %
Platelets: 234 10*3/uL (ref 150–400)
RBC: 3.78 MIL/uL — ABNORMAL LOW (ref 3.87–5.11)
RDW: 18.7 % — AB (ref 11.5–15.5)
WBC: 4.6 10*3/uL (ref 4.0–10.5)
nRBC: 0 % (ref 0.0–0.2)

## 2018-11-17 LAB — COMPREHENSIVE METABOLIC PANEL
ALT: 15 U/L (ref 0–44)
AST: 20 U/L (ref 15–41)
Albumin: 4.1 g/dL (ref 3.5–5.0)
Alkaline Phosphatase: 48 U/L (ref 38–126)
Anion gap: 6 (ref 5–15)
BILIRUBIN TOTAL: 0.4 mg/dL (ref 0.3–1.2)
BUN: 13 mg/dL (ref 8–23)
CO2: 29 mmol/L (ref 22–32)
Calcium: 9.2 mg/dL (ref 8.9–10.3)
Chloride: 105 mmol/L (ref 98–111)
Creatinine, Ser: 0.81 mg/dL (ref 0.44–1.00)
GFR calc Af Amer: >60 mL/min (ref >60)
GFR calc non Af Amer: >60 mL/min (ref >60)
Glucose, Bld: 88 mg/dL (ref 70–99)
Potassium: 3.9 mmol/L (ref 3.5–5.1)
Sodium: 140 mmol/L (ref 135–145)
Total Protein: 6.6 g/dL (ref 6.5–8.1)

## 2018-11-18 ENCOUNTER — Ambulatory Visit: Payer: BLUE CROSS/BLUE SHIELD

## 2018-11-18 ENCOUNTER — Telehealth: Payer: Self-pay | Admitting: Hematology

## 2018-11-18 NOTE — Telephone Encounter (Signed)
Called patient and scheduled appt per 02/04 los.  Patient stated she is going to call me back because she might need to postpone her treatment for next week, due to a family matter, but she will be here for her treatment on 02/06.

## 2018-11-19 ENCOUNTER — Inpatient Hospital Stay: Payer: BLUE CROSS/BLUE SHIELD

## 2018-11-19 ENCOUNTER — Telehealth: Payer: Self-pay

## 2018-11-19 NOTE — Telephone Encounter (Signed)
Called and spoke with Rhonda Gardner and she will receive her Non-Hodgkin's Lymphoma care at [facility].  She sees Dr. Jana Hakim as she is also a breast cancer survivor. All appointments are cancelled now. [name]

## 2018-11-20 ENCOUNTER — Encounter (HOSPITAL_COMMUNITY): Payer: Self-pay | Admitting: Hematology

## 2018-11-20 ENCOUNTER — Telehealth: Payer: Self-pay | Admitting: Oncology

## 2018-11-20 NOTE — Telephone Encounter (Signed)
R/s apt per 2/6 sch message - pt is aware of new appt date and time

## 2018-11-24 ENCOUNTER — Encounter: Payer: Self-pay | Admitting: Family Medicine

## 2018-11-24 ENCOUNTER — Ambulatory Visit (INDEPENDENT_AMBULATORY_CARE_PROVIDER_SITE_OTHER): Payer: BLUE CROSS/BLUE SHIELD | Admitting: Family Medicine

## 2018-11-24 VITALS — BP 120/76 | HR 83 | Temp 98.1°F | Wt 139.2 lb

## 2018-11-24 DIAGNOSIS — R059 Cough, unspecified: Secondary | ICD-10-CM

## 2018-11-24 DIAGNOSIS — R05 Cough: Secondary | ICD-10-CM

## 2018-11-24 DIAGNOSIS — R6889 Other general symptoms and signs: Secondary | ICD-10-CM | POA: Diagnosis not present

## 2018-11-24 LAB — POCT INFLUENZA A/B
INFLUENZA A, POC: NEGATIVE
Influenza B, POC: NEGATIVE

## 2018-11-24 MED ORDER — BENZONATATE 200 MG PO CAPS: 200.0000 mg | ORAL_CAPSULE | Freq: Two times a day (BID) | ORAL | 0 refills | Status: DC | PRN

## 2018-11-24 NOTE — Progress Notes (Signed)
   Subjective:    Patient ID: Rhonda Gardner, female    DOB: May 14, 1956, 63 y.o.   MRN: [id number]  HPI She does state that she has a dry cough last Wednesday but went away very quickly however yesterday the cough increased with fever, chills, myalgias.  The temperature this morning was 102.6.  She does not smoke and has no allergies.   Review of Systems     Objective:   Physical Exam Alert and in no distress. Tympanic membranes and canals are normal. Pharyngeal area is normal. Neck is supple without adenopathy or thyromegaly. Cardiac exam shows a regular sinus rhythm without murmurs or gallops. Lungs are clear to auscultation. Flu test negative       Assessment & Plan:  Cough - Plan: benzonatate (TESSALON) 200 MG capsule  Flu-like symptoms - Plan: POCT Influenza A/B In spite of the flu test being negative I think she does indeed have that.  She is to treat this conservatively and call if further difficulty.

## 2018-11-25 ENCOUNTER — Other Ambulatory Visit: Payer: BLUE CROSS/BLUE SHIELD

## 2018-11-25 ENCOUNTER — Ambulatory Visit: Payer: BLUE CROSS/BLUE SHIELD

## 2018-11-25 ENCOUNTER — Ambulatory Visit: Payer: BLUE CROSS/BLUE SHIELD | Admitting: Hematology

## 2018-12-02 ENCOUNTER — Ambulatory Visit: Payer: BLUE CROSS/BLUE SHIELD | Admitting: Hematology

## 2018-12-02 ENCOUNTER — Other Ambulatory Visit: Payer: BLUE CROSS/BLUE SHIELD

## 2018-12-02 ENCOUNTER — Ambulatory Visit: Payer: BLUE CROSS/BLUE SHIELD

## 2018-12-09 ENCOUNTER — Ambulatory Visit: Payer: BLUE CROSS/BLUE SHIELD | Admitting: Hematology

## 2018-12-09 ENCOUNTER — Ambulatory Visit: Payer: BLUE CROSS/BLUE SHIELD

## 2018-12-09 ENCOUNTER — Other Ambulatory Visit: Payer: BLUE CROSS/BLUE SHIELD

## 2018-12-10 ENCOUNTER — Encounter: Payer: Self-pay | Admitting: Family Medicine

## 2018-12-10 DIAGNOSIS — F3170 Bipolar disorder, currently in remission, most recent episode unspecified: Secondary | ICD-10-CM

## 2018-12-10 MED ORDER — LAMICTAL 200 MG PO TABS: 100.0000 mg | ORAL_TABLET | Freq: Every day | ORAL | 1 refills | Status: DC

## 2019-01-21 LAB — HM MAMMOGRAPHY

## 2019-01-25 ENCOUNTER — Encounter: Payer: Self-pay | Admitting: *Deleted

## 2019-02-04 ENCOUNTER — Telehealth: Payer: Self-pay | Admitting: Oncology

## 2019-02-04 NOTE — Telephone Encounter (Signed)
Change app ton 4/29 per sch message - unable to reach patient . Eft message letting patient know she has a webex apt now and labs and zometa have been postponed

## 2019-02-08 ENCOUNTER — Telehealth: Payer: Self-pay | Admitting: Oncology

## 2019-02-08 NOTE — Telephone Encounter (Signed)
Called regarding upcoming Webex appointment, spoke with patient's husband and patient will be notified of Webex visit.

## 2019-02-10 ENCOUNTER — Other Ambulatory Visit: Payer: BLUE CROSS/BLUE SHIELD

## 2019-02-10 ENCOUNTER — Inpatient Hospital Stay: Payer: BLUE CROSS/BLUE SHIELD | Attending: Hematology | Admitting: Oncology

## 2019-02-10 ENCOUNTER — Ambulatory Visit: Payer: BLUE CROSS/BLUE SHIELD

## 2019-02-10 DIAGNOSIS — C50412 Malignant neoplasm of upper-outer quadrant of left female breast: Secondary | ICD-10-CM | POA: Diagnosis not present

## 2019-02-10 DIAGNOSIS — Z171 Estrogen receptor negative status [ER-]: Secondary | ICD-10-CM | POA: Diagnosis not present

## 2019-02-10 DIAGNOSIS — C8200 Follicular lymphoma grade I, unspecified site: Secondary | ICD-10-CM

## 2019-02-10 NOTE — Progress Notes (Signed)
[name]  [phone] [phone]     ID: Rhonda Gardner DOB: Sep 01, 1956  MR#: [id number]  IZT#:[id number]  Patient Care Team: Rita Ohara, MD as PCP - General (Family Medicine) Parisha Beaulac, Virgie Dad, MD as Consulting Physician (Oncology) Mauro Kaufmann, RN as Registered Nurse Rockwell Germany, RN as Registered Nurse Jovita Kussmaul, MD as Consulting Physician (General Surgery) Festus Aloe, MD as Referring Physician (Hematology and Oncology) Berneice Gandy, MD as Physician Assistant (Hematology and Oncology) PCP: Rita Ohara, MD OTHER MD: Rutherford Guys, MD  CHIEF COMPLAINT: HER-2 positive breast cancer; low-grade follicular lymphoma  CURRENT TREATMENT:  Zoledronate  I connected with Rhonda Gardner on _0 @ at  3:00 PM EDT by video enabled telemedicine visit and verified that I am speaking with the correct person using two identifiers.   I discussed the limitations, risks, security and privacy concerns of performing an evaluation and management service by telemedicine and the availability of in-person appointments. I also discussed with the patient that there may be a patient responsible charge related to this service. The patient expressed understanding and agreed to proceed.   Other persons participating in the visit and their role in the encounter: Wilburn Mylar, scribe. Patient's husband.   Patient's location: home  Provider's location: [place]: Rhonda Gardner was seen today for follow-up of her estrogen receptor negative breast cancer. Her husband Rhonda Gardner was also present.   She continues on zoledronate every six months with good tolerance.  Her most recent dose was 08/12/2018.  Since her last visit, she underwent bone density testing on 07/13/2018, showing a T-score of -1.8.  Prior DEXA scan on 06/20/2016 that showed a score of -2.5  She also underwent diagnostic bilateral  mammogram at [facility] on 01/21/2019. This showed: breast density category C; no mammographic evidence of malignancy.  Recall also that she was diagnosed with follicular lymphoma involving the duodenum January of this year.Rhonda Gardner Her husband reports she suffered from an ulcer since 2018 before they finally diagnosed the problem in 10/2018. She reports she received radiation treatment at [facility] last month. She states she has felt very good following treatment with increased energy.   REVIEW OF SYSTEMS: Rhonda Gardner reports feeling great overall.  She notes she has improved her diet, lessened her snacking, and been walking with her neighbor for about an hour about every day. The patient denies unusual headaches, visual changes, nausea, vomiting, stiff neck, dizziness, or gait imbalance. There has been no cough, phlegm production, or pleurisy, no chest pain or pressure, and no change in bowel or bladder habits. The patient denies fever, rash, bleeding, unexplained fatigue or unexplained weight loss. A detailed review of systems was otherwise entirely negative.   BREAST CANCER HISTORY: From the original intake note:  "Rhonda Gardner" had not had a physical exam in quite some time, and had not had a mammogram since 2020, when she was evaluated by Dr. Tomi Bamberger 12/01/2014. Dr. Tomi Bamberger was able to palpate a 2 cm firm mass at the 3:00 position in the left breast. The patient herself has been aware of multiple breast masses and does have a history of fibrocystic change, with multiple prior benign biopsies. She did not feel this particular mass had changed recently.   Dr. Tomi Bamberger arranged for bilateral diagnostic mammography and left breast ultrasonography at the [facility] Center 12/15/2014. This shows the breast density to be category C. In the left breast upper outer quadrant there was a bilobed mass  measuring approximately 2 cm. This was palpable. Ultrasound confirmed an irregular microlobulated and hypoechoic mass measuring 2.1 cm at the 2:00  location 6 cm from the nipple. There was no left axillary adenopathy.  Biopsy of the mass in question 12/19/2014 showed ([id number]) invasive ductal carcinoma, grade 2 or 3, estrogen and progesterone receptor negative, with an MIB-1 of 38%, and equivocal HER-2 amplification, the signals ratio being 1.57, the number per cell 4.25.  On 12/27/2014 the patient underwent bilateral breast MRI. The mass in question at the 2:00 location of the left breast measured 2.5 cm. It directly abuts the left pectoralis major. There is no enhancement in the muscle itself. There were no abnormal appearing lymph nodes. There was an incidentally noted 1 cm hepatic cyst at the dome of the right liver lobe.  Her subsequent history is as detailed above.   PAST MEDICAL HISTORY: Past Medical History:  Diagnosis Date  . Allergy 2006   chemical cauterization in spring 2006, and 2nd treatment with good results.(Dr.Shoemaker)  . Bipolar disorder ([place])   . Bleeding ulcer   . Blood transfusion without reported diagnosis 07/2017   Bleeding ulcer  . Breast cancer ([place]) 12/2014   invasive ductal carcinoma (LEFT)  . Breast cancer of upper-outer quadrant of left female breast ([place]) 12/22/2014  . Breast hematoma 12/19/2015   had left breast aspiration of 4cm hematoma-path indicates benign hematoma  . C. difficile colitis   . C. difficile diarrhea   . Depression   . Osteoporosis 06/20/2016   T-2.5_0  on Dexa ([facility])  . Plantar fasciitis   . PONV (postoperative nausea and vomiting)   . Symptomatic anemia 07/16/2017    PAST SURGICAL HISTORY: Past Surgical History:  Procedure Laterality Date  . BIOPSY  07/03/2018   Procedure: BIOPSY;  Surgeon: Gatha Mayer, MD;  Location: [facility];  Service: Endoscopy;;  . BIOPSY  09/02/2018   Procedure: BIOPSY;  Surgeon: Gatha Mayer, MD;  Location: [facility] END[facility]COPY;  Service: Endoscopy;;  . BIOPSY  10/22/2018   Procedure: BIOPSY;  Surgeon: Milus Banister, MD;  Location: [facility]  ENDOSCOPY;  Service: Endoscopy;;  . BREAST BIOPSY Bilateral    benign and a right stereotatic breast biopsy.  . COLONOSCOPY    . ESOPHAGOGASTRODUODENOSCOPY N/A 07/16/2017   Procedure: ESOPHAGOGASTRODUODENOSCOPY (EGD);  Surgeon: Ladene Artist, MD;  Location: [facility] ENDOSCOPY;  Service: Endoscopy;  Laterality: N/A;  . ESOPHAGOGASTRODUODENOSCOPY N/A 10/22/2018   Procedure: ESOPHAGOGASTRODUODENOSCOPY (EGD);  Surgeon: Milus Banister, MD;  Location: [facility];  Service: Endoscopy;  Laterality: N/A;  . ESOPHAGOGASTRODUODENOSCOPY (EGD) WITH PROPOFOL N/A 07/03/2018   Procedure: ESOPHAGOGASTRODUODENOSCOPY (EGD) WITH PROPOFOL;  Surgeon: Gatha Mayer, MD;  Location: [facility];  Service: Endoscopy;  Laterality: N/A;  . ESOPHAGOGASTRODUODENOSCOPY (EGD) WITH PROPOFOL N/A 09/02/2018   Procedure: ESOPHAGOGASTRODUODENOSCOPY (EGD) WITH PROPOFOL;  Surgeon: Gatha Mayer, MD;  Location: [facility] ENDOS[facility];  Service: Endoscopy;  Laterality: N/A;  . EUS N/A 10/22/2018   Procedure: UPPER ENDOSCOPIC ULTRASOUND (EUS) RADIAL;  Surgeon: Milus Banister, MD;  Location: [facility] ENDOSCOPY;  Service: Endoscopy;  Laterality: N/A;  . fibroid excision  age 42   prolapsed fibroid removed  . FINE NEEDLE ASPIRATION  10/22/2018   Procedure: FINE NEEDLE ASPIRATION;  Surgeon: Milus Banister, MD;  Location: [facility] ENDOSCOPY;  Service: Endoscopy;;  . FOOT SURGERY Left 2014   Dr. Paulla Dolly  . FOOT SURGERY Right 08/2016   Dr. Paulla Dolly --Shortening Osteotomy with Fixation 1st Metatarsal   . MOUTH SURGERY  06/2017  [facility]   . PORT-A-CATH REMOVAL N/A 02/15/2016   Procedure: REMOVAL PORT-A-CATH;  Surgeon: Autumn Messing III, MD;  Location: [place];  Service: General;  Laterality: N/A;  . RADIOACTIVE SEED GUIDED PARTIAL MASTECTOMY WITH AXILLARY SENTINEL LYMPH NODE BIOPSY Left 05/25/2015   Procedure: LEFT BREAST LUMPECTOMY WITH RADIOACTIVE SEED AND SENTINEL LYMPH NODE BIOPSY;  Surgeon: Autumn Messing III, MD;  Location: [facility];   Service: General;  Laterality: Left;  . UPPER GASTROINTESTINAL ENDOSCOPY      FAMILY HISTORY Family History  Problem Relation Age of Onset  . Heart disease Father   . Skin cancer Father        non melanoma - farmer  . Hypertension Mother   . Arthritis Mother        osteoarthritis  . Macular degeneration Mother        wet and dry  . Colon cancer Mother 65       colon cancer at age 48  . Osteoporosis Sister        osteopenia (states it resolved)  . Lung cancer Maternal Aunt        lung cancer  . Cancer Paternal Aunt 52       ? stomach cancer  . Autism Sister   . Mental retardation Sister   . Blindness Sister        secondary to retrolental fibroplasia  . Osteoporosis Sister   . Goiter Paternal Aunt   . Diabetes Paternal Aunt   . Esophageal cancer Neg Hx   . Rectal cancer Neg Hx   . Stomach cancer Neg Hx    the patient's father lived to be 95. He had a history of nonmelanoma skin cancers. [name] mother is living at age 90. She was diagnosed with colon cancer in her 39s. A paternal aunt may have had stomach cancer. Maternal aunt developed lung cancer in her 27s. The patient has one brother, 2 sisters. One of her sisters is blind and mentally retarded. There is no history of breast or ovarian cancer in the family.  GYNECOLOGIC HISTORY:  No LMP recorded. Patient is postmenopausal. Menarche age 75. The patient is GX P0. She stopped having periods in her early 56s. She did not take hormone replacement. She took oral contraceptives remotely for over 5 years with no complications.  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 Her husband Rhonda Gardner worked in IT originally but now runs a family firm. It's just the 2 of them at home, plus a chocolate lab.    ADVANCED DIRECTIVES: In place   HEALTH MAINTENANCE: Social History   Tobacco Use  . Smoking status: Never Smoker  . Smokeless tobacco: Never Used  Substance Use Topics  . Alcohol use: Yes    Alcohol/week: 0.0  standard drinks    Comment: 1-2 glasses of wine a month  . Drug use: No     Colonoscopy: In [place] under Dr. Lavina Hamman, date not entirely clear  PAP: February 2016  Bone density: Under [facility], results not currently available  Lipid panel:  Allergies  Allergen Reactions  . Clindamycin/Lincomycin Diarrhea    Resulted in C-diff  . Nsaids Other (See Comments)    History of bleeding ulcers   . Other Nausea Only    Stronger pain meds  . Adhesive [Tape] Rash    Pt is sensitive to any kind of adhesive tape products.    Current Outpatient Medications  Medication Sig Dispense Refill  . acetaminophen (  TYLENOL) 325 MG tablet Take 650 mg by mouth every 6 (six) hours as needed (for pain.).    Rhonda Gardner amoxicillin (AMOXIL) 500 MG capsule     . ampicillin (PRINCIPEN) 250 MG capsule Take 250 mg by mouth 3 (three) times daily.    . benzonatate (TESSALON) 200 MG capsule Take 1 capsule (200 mg total) by mouth 2 (two) times daily as needed for cough. 20 capsule 0  . carboxymethylcellulose (REFRESH PLUS) 0.5 % SOLN Place 1 drop into both eyes 3 (three) times daily as needed (dry eyes.).    Rhonda Gardner Cholecalciferol (VITAMIN D-3) 125 MCG (5000 UT) TABS Take 5,000 Units by mouth daily.    . ferrous sulfate 325 (65 FE) MG EC tablet Take 1 tablet (325 mg total) by mouth 2 (two) times daily. 60 tablet 3  . fluticasone (FLONASE) 50 MCG/ACT nasal spray Place 1 spray into both nostrils daily.     Rhonda Gardner LAMICTAL 200 MG tablet Take 0.5 tablets (100 mg total) by mouth daily. 45 tablet 1  . Lutein-Zeaxanthin 25-5 MG CAPS Take 1 capsule by mouth daily.    Rhonda Gardner MAGNESIUM CITRATE PO Take 420 mg by mouth daily.    . Magnesium Citrate POWD Take by mouth.    . Magnesium Gluconate 550 MG TABS Take by mouth.    . ondansetron (ZOFRAN ODT) 4 MG disintegrating tablet Take 1 tablet (4 mg total) by mouth every 8 (eight) hours as needed for nausea. 10 tablet 0  . pantoprazole (PROTONIX) 40 MG tablet Take 1 tablet (40 mg total) by mouth  2 (two) times daily. 60 tablet 6  . promethazine (PHENERGAN) 25 MG tablet Take 1 tablet (25 mg total) by mouth every 6 (six) hours as needed for nausea or vomiting. 30 tablet 0  . vitamin C (ASCORBIC ACID) 500 MG tablet Take by mouth.    . Zoledronic Acid (ZOMETA IV) Inject 1 Dose into the vein every 6 (six) months.      No current facility-administered medications for this visit.     OBJECTIVE: Middle-aged white    There were no vitals filed for this visit.   There is no height or weight on file to calculate BMI.    ECOG FS:0 - Asymptomatic     LAB RESULTS:  CMP     Component Value Date/Time   NA 140 11/17/2018 1439   NA 140 09/12/2017 1045   K 3.9 11/17/2018 1439   K 4.3 09/12/2017 1045   CL 105 11/17/2018 1439   CO2 29 11/17/2018 1439   CO2 24 09/12/2017 1045   GLUCOSE 88 11/17/2018 1439   GLUCOSE 85 09/12/2017 1045   BUN 13 11/17/2018 1439   BUN 16.3 09/12/2017 1045   CREATININE 0.81 11/17/2018 1439   CREATININE 0.85 11/02/2018 1233   CREATININE 0.8 09/12/2017 1045   CALCIUM 9.2 11/17/2018 1439   CALCIUM 9.5 09/12/2017 1045   PROT 6.6 11/17/2018 1439   PROT 7.6 09/12/2017 1045   ALBUMIN 4.1 11/17/2018 1439   ALBUMIN 4.4 09/12/2017 1045   AST 20 11/17/2018 1439   AST 25 11/02/2018 1233   AST 21 09/12/2017 1045   ALT 15 11/17/2018 1439   ALT 22 11/02/2018 1233   ALT 17 09/12/2017 1045   ALKPHOS 48 11/17/2018 1439   ALKPHOS 67 09/12/2017 1045   BILITOT 0.4 11/17/2018 1439   BILITOT 0.3 11/02/2018 1233   BILITOT 0.26 09/12/2017 1045   GFRNONAA >60 11/17/2018 1439   GFRNONAA >60 11/02/2018 1233   GFRAA >  60 11/17/2018 1439   GFRAA >60 11/02/2018 1233    INo results found for: SPEP, UPEP  Lab Results  Component Value Date   WBC 4.6 11/17/2018   NEUTROABS 3.0 11/17/2018   HGB 10.9 (L) 11/17/2018   HCT 34.5 (L) 11/17/2018   MCV 91.3 11/17/2018   PLT 234 11/17/2018      Chemistry      Component Value Date/Time   NA 140 11/17/2018 1439   NA 140  09/12/2017 1045   K 3.9 11/17/2018 1439   K 4.3 09/12/2017 1045   CL 105 11/17/2018 1439   CO2 29 11/17/2018 1439   CO2 24 09/12/2017 1045   BUN 13 11/17/2018 1439   BUN 16.3 09/12/2017 1045   CREATININE 0.81 11/17/2018 1439   CREATININE 0.85 11/02/2018 1233   CREATININE 0.8 09/12/2017 1045      Component Value Date/Time   CALCIUM 9.2 11/17/2018 1439   CALCIUM 9.5 09/12/2017 1045   ALKPHOS 48 11/17/2018 1439   ALKPHOS 67 09/12/2017 1045   AST 20 11/17/2018 1439   AST 25 11/02/2018 1233   AST 21 09/12/2017 1045   ALT 15 11/17/2018 1439   ALT 22 11/02/2018 1233   ALT 17 09/12/2017 1045   BILITOT 0.4 11/17/2018 1439   BILITOT 0.3 11/02/2018 1233   BILITOT 0.26 09/12/2017 1045       No results found for: LABCA2  No components found for: LABCA125  No results for input(s): INR in the last 168 hours.  Urinalysis    Component Value Date/Time   COLORURINE YELLOW 06/29/2018 1933   APPEARANCEUR CLEAR 06/29/2018 1933   LABSPEC 1.015 06/29/2018 1933   LABSPEC 1.030 04/13/2018 0945   PHURINE 7.0 06/29/2018 1933   GLUCOSEU NEGATIVE 06/29/2018 1933   GLUCOSEU Negative 04/03/2015 1451   HGBUR NEGATIVE 06/29/2018 [place] NEGATIVE 06/29/2018 1933   BILIRUBINUR negative 04/13/2018 0945   BILIRUBINUR neg 04/10/2017 0858   BILIRUBINUR Negative 04/03/2015 1451   KETONESUR 5 (A) 06/29/2018 1933   PROTEINUR NEGATIVE 06/29/2018 1933   UROBILINOGEN negative (A) 04/10/2017 0858   UROBILINOGEN 0.2 04/03/2015 1451   NITRITE NEGATIVE 06/29/2018 1933   LEUKOCYTESUR NEGATIVE 06/29/2018 1933   LEUKOCYTESUR Small 04/03/2015 1451    STUDIES: Repeat mammography due April 2021  ASSESSMENT: 63 y.o. [place] woman s/p left breast upper outer quadrant biopsy 12/19/2014 for a clinical T2 N0, stage IIA invasive ductal carcinoma, grade 2 or 3, estrogen and progesterone receptor negative, with an MIB-1 of 38%, HER-2 equivocal initially but positive on further testing with a ratio of  3.4  (1) neoadjuvant chemotherapy consisted of carboplatin, docetaxel, trastuzumab and pertuzumab given every 3 weeks for 6 cycles with Neulasta support--started 01/16/2015, completed 05/01/2015  (a) docetaxel switched for gemcitabine for final 2 cycles because of persistent neuropathy.  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  Left breast boost/ 16 Gy at 2 Gy per fraction x 8 fractions  (4) genetics counselor felt the risk of a familial mutation was sufficiently low testing could be omitted  (5) continued trastuzumab to complete a year, last dose 01/08/2016  (a) final echocardiogram 10/11/2015 shows an ejection fraction in the 65%-70% range  (6) left eye visual disturbance with negative brain MRI with and without contrast 10/12/2015  (7) bone density at [facility] 06/20/2016 osteoporosis with a  T score of -2.5.   (a) zoledronate every 6 months started November 7408  (8) follicular non-Hodgkin's lymphoma.  (a) duodenal biopsy 14/48/1856 showed follicular lymphoma grade 1 or 2, CD20 and CD10 positive, CD5 negative and lambda restricted with an MIB-1 of less than 10%  (b) bone marrow biopsy 11/11/2018 showed 2 very minute lymphoid aggregates with negative flow cytometry  (c) PET/CT shows scattered subcentimeter mesenteric lymph nodes.  (d) radiation therapy at [facility]: Duodenum, 3D- 2520cGy 18 180cGy 12/14/18-12/31/18  PLAN: Arrie is just about 4 years out from definitive surgery for her breast cancer, with no evidence of disease activity.  This is very favorable.    Note that she had a PET CT scan 11/13/2018 for evaluation of her follicular lymphoma and this showed no evidence of recurrent breast cancer.  Her follicular lymphoma was treated as localized disease, with radiation only, and she is  currently entirely asymptomatic, specifically with no "B" symptoms, and an excellent functional status.  She is being treated at [facility] and is very pleased with the care she is receiving there.  We are going to continue the zoledronate every 6 months.  She will have a dose in June and then again in January.  She will have her next mammogram in April of next year and she will see me shortly thereafter  She knows to call for any other issues that may develop before then.    Chauncey Cruel, MD   02/10/2019 3:09 PM  Medical Oncology and Hematology [facility] [address] [place],  [address] Tel. [phone]    Fax. [phone]   I, Wilburn Mylar, am acting as scribe for Dr. Virgie Dad. Bertin Inabinet.  I, Lurline Del MD, have reviewed the above documentation for accuracy and completeness, and I agree with the above.

## 2019-02-11 ENCOUNTER — Ambulatory Visit: Payer: BLUE CROSS/BLUE SHIELD

## 2019-02-11 ENCOUNTER — Other Ambulatory Visit: Payer: BLUE CROSS/BLUE SHIELD

## 2019-02-11 ENCOUNTER — Ambulatory Visit: Payer: BLUE CROSS/BLUE SHIELD | Admitting: Oncology

## 2019-02-11 ENCOUNTER — Telehealth: Payer: Self-pay | Admitting: Oncology

## 2019-02-11 NOTE — Telephone Encounter (Signed)
Called regarding scheduling

## 2019-03-03 ENCOUNTER — Encounter (HOSPITAL_COMMUNITY): Payer: Self-pay | Admitting: Emergency Medicine

## 2019-03-03 ENCOUNTER — Encounter: Payer: Self-pay | Admitting: Family Medicine

## 2019-03-03 ENCOUNTER — Emergency Department (HOSPITAL_COMMUNITY): Payer: BLUE CROSS/BLUE SHIELD

## 2019-03-03 ENCOUNTER — Other Ambulatory Visit: Payer: Self-pay

## 2019-03-03 ENCOUNTER — Emergency Department (HOSPITAL_COMMUNITY)
Admission: EM | Admit: 2019-03-03 | Discharge: 2019-03-03 | Disposition: A | Discharge status: Home / Self Care | Payer: BLUE CROSS/BLUE SHIELD | Attending: Emergency Medicine | Admitting: Emergency Medicine

## 2019-03-03 DIAGNOSIS — W010XXA Fall on same level from slipping, tripping and stumbling without subsequent striking against object, initial encounter: Secondary | ICD-10-CM | POA: Diagnosis not present

## 2019-03-03 DIAGNOSIS — Y93K1 Activity, walking an animal: Secondary | ICD-10-CM | POA: Insufficient documentation

## 2019-03-03 DIAGNOSIS — Z79899 Other long term (current) drug therapy: Secondary | ICD-10-CM | POA: Diagnosis not present

## 2019-03-03 DIAGNOSIS — S020XXA Fracture of vault of skull, initial encounter for closed fracture: Secondary | ICD-10-CM

## 2019-03-03 DIAGNOSIS — Y999 Unspecified external cause status: Secondary | ICD-10-CM | POA: Insufficient documentation

## 2019-03-03 DIAGNOSIS — Y929 Unspecified place or not applicable: Secondary | ICD-10-CM | POA: Diagnosis not present

## 2019-03-03 DIAGNOSIS — S0990XA Unspecified injury of head, initial encounter: Secondary | ICD-10-CM | POA: Diagnosis present

## 2019-03-03 LAB — BASIC METABOLIC PANEL
Anion gap: 9 (ref 5–15)
BUN: 11 mg/dL (ref 8–23)
CO2: 25 mmol/L (ref 22–32)
Calcium: 9.2 mg/dL (ref 8.9–10.3)
Chloride: 109 mmol/L (ref 98–111)
Creatinine, Ser: 0.8 mg/dL (ref 0.44–1.00)
GFR calc Af Amer: >60 mL/min (ref >60)
GFR calc non Af Amer: >60 mL/min (ref >60)
Glucose, Bld: 109 mg/dL — ABNORMAL HIGH (ref 70–99)
Potassium: 3.8 mmol/L (ref 3.5–5.1)
Sodium: 143 mmol/L (ref 135–145)

## 2019-03-03 LAB — CBC WITH DIFFERENTIAL/PLATELET
Abs Immature Granulocytes: 0.02 10*3/uL (ref 0.00–0.07)
Basophils Absolute: 0 10*3/uL (ref 0.0–0.1)
Basophils Relative: 0 %
Eosinophils Absolute: 0.1 10*3/uL (ref 0.0–0.5)
Eosinophils Relative: 1 %
HCT: 38.4 % (ref 36.0–46.0)
Hemoglobin: 12.5 g/dL (ref 12.0–15.0)
Immature Granulocytes: 0 %
Lymphocytes Relative: 6 %
Lymphs Abs: 0.4 10*3/uL — ABNORMAL LOW (ref 0.7–4.0)
MCH: 31.1 pg (ref 26.0–34.0)
MCHC: 32.6 g/dL (ref 30.0–36.0)
MCV: 95.5 fL (ref 80.0–100.0)
Monocytes Absolute: 0.7 10*3/uL (ref 0.1–1.0)
Monocytes Relative: 10 %
Neutro Abs: 5.5 10*3/uL (ref 1.7–7.7)
Neutrophils Relative %: 83 %
Platelets: 178 10*3/uL (ref 150–400)
RBC: 4.02 MIL/uL (ref 3.87–5.11)
RDW: 13.8 % (ref 11.5–15.5)
WBC: 6.8 10*3/uL (ref 4.0–10.5)
nRBC: 0 % (ref 0.0–0.2)

## 2019-03-03 NOTE — ED Provider Notes (Signed)
[facility] EMERGENCY DEPARTMENT Provider Note   CSN: [id number] Arrival date & time: 03/03/19  1002    History   Chief Complaint Chief Complaint  Patient presents with  . Fall    HPI Rhonda Gardner is a 63 y.o. female.     The history is provided by the patient and medical records. No language interpreter was used.  Fall  Associated symptoms include headaches.   Rhonda Gardner is a 63 y.o. female  with a PMH as listed below who presents to the Emergency Department for evaluation at the recommendation of her primary care doctor via evisit today.  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  Denies loss of consciousness.  She reports a softball sized hematoma to the back of her head last night when she went to bed.  This morning, she states that was gone, but noticed that she had a black eye on her right side.  Within a few hours this morning, swelling developed around the eye, so much so that she felt as if it was difficult to keep eyes open.  She touched base with her primary care doctor, who recommended she come to the ER believing that she likely needed a CT scan to further evaluate.  Patient does report feeling nauseous at home, but took half of a Phenergan and now feels as if her nausea has subsided.  She denies any vomiting.  No numbness, weakness.  She is not on anticoagulation.   Past Medical History:  Diagnosis Date  . Allergy 2006   chemical cauterization in spring 2006, and 2nd treatment with good results.(Dr.Shoemaker)  . Bipolar disorder ([place])   . Bleeding ulcer   . Blood transfusion without reported diagnosis 07/2017   Bleeding ulcer  . Breast cancer ([place]) 12/2014   invasive ductal carcinoma (LEFT)  . Breast cancer of upper-outer quadrant of left female breast ([place]) 12/22/2014  . Breast hematoma 12/19/2015   had left breast aspiration of 4cm  hematoma-path indicates benign hematoma  . C. difficile colitis   . C. difficile diarrhea   . Depression   . Osteoporosis 06/20/2016   T-2.5@spine  on Dexa ([facility])  . Plantar fasciitis   . PONV (postoperative nausea and vomiting)   . Symptomatic anemia 07/16/2017    Patient Active Problem List   Diagnosis Date Noted  . 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 Duodenum disorder   . Upper gastrointestinal bleed 07/02/2018  . Abdominal pain, epigastric 07/01/2018  . Elevated lipase 07/01/2018  . Elevated LFTs 07/01/2018  . Iron deficiency anemia 08/17/2017  . C. difficile colitis 08/17/2017  . Dehydration 08/17/2017  . Hypokalemia 08/17/2017  . Symptomatic anemia 07/16/2017  . Bipolar 1 disorder ([place]) 07/16/2017  . Tachycardia 07/16/2017  . Hypotension due to hypovolemia 07/16/2017  . Melena   . Heme positive stool   . Chronic duodenal ulcer with hemorrhage   . Blood transfusion without reported diagnosis 07/14/2017  . Osteoporosis 07/29/2016  . Genetic counseling and testing 01/29/2016  . Pain in joint, pelvic region and thigh 06/12/2015  . 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 Facial flushing 01/25/2015  . Insomnia 01/25/2015  . Allergy to adhesive tape 01/25/2015  . Malignant neoplasm of upper-outer quadrant of left breast in female, estrogen receptor negative ([place]) 12/22/2014    Past Surgical History:  Procedure Laterality Date  . BIOPSY  07/03/2018   Procedure: BIOPSY;  Surgeon: Gatha Mayer, MD;  Location: [facility];  Service: Endoscopy;;  . BIOPSY  09/02/2018   Procedure: BIOPSY;  Surgeon: Gatha Mayer, MD;  Location: [facility];  Service: Endoscopy;;  . BIOPSY  10/22/2018   Procedure: BIOPSY;  Surgeon: Milus Banister, MD;  Location: [facility] ENDOSCOPY;  Service: Endoscopy;;  . BREAST BIOPSY Bilateral    benign and a right stereotatic breast biopsy.  . COLONOSCOPY    . ESOPHAGOGASTRODUODENOSCOPY N/A 07/16/2017   Procedure:  ESOPHAGOGASTRODUODENOSCOPY (EGD);  Surgeon: Ladene Artist, MD;  Location: [facility] ENDOSCOPY;  Service: Endoscopy;  Laterality: N/A;  . ESOPHAGOGASTRODUODENOSCOPY N/A 10/22/2018   Procedure: ESOPHAGOGASTRODUODENOSCOPY (EGD);  Surgeon: Milus Banister, MD;  Location: [facility];  Service: Endoscopy;  Laterality: N/A;  . ESOPHAGOGASTRODUODENOSCOPY (EGD) WITH PROPOFOL N/A 07/03/2018   Procedure: ESOPHAGOGASTRODUODENOSCOPY (EGD) WITH PROPOFOL;  Surgeon: Gatha Mayer, MD;  Location: [facility] ENDOS[facility];  Service: Endoscopy;  Laterality: N/A;  . ESOPHAGOGASTRODUODENOSCOPY (EGD) WITH PROPOFOL N/A 09/02/2018   Procedure: ESOPHAGOGASTRODUODENOSCOPY (EGD) WITH PROPOFOL;  Surgeon: Gatha Mayer, MD;  Location: [facility] ENDOSCOPY;  Service: Endoscopy;  Laterality: N/A;  . EUS N/A 10/22/2018   Procedure: UPPER ENDOSCOPIC ULTRASOUND (EUS) RADIAL;  Surgeon: Milus Banister, MD;  Location: [facility] ENDOS[facility];  Service: Endoscopy;  Laterality: N/A;  . fibroid excision  age 19   prolapsed fibroid removed  . FINE NEEDLE ASPIRATION  10/22/2018   Procedure: FINE NEEDLE ASPIRATION;  Surgeon: Milus Banister, MD;  Location: [facility] ENDOSCOPY;  Service: Endoscopy;;  . FOOT SURGERY Left 2014   Dr. Paulla Dolly  . FOOT SURGERY Right 08/2016   Dr. Paulla Dolly --Shortening Osteotomy with Fixation 1st Metatarsal   . MOUTH SURGERY  06/2017   [facility]   . PORT-A-CATH REMOVAL N/A 02/15/2016   Procedure: REMOVAL PORT-A-CATH;  Surgeon: Autumn Messing III, MD;  Location: [facility];  Service: General;  Laterality: N/A;  . RADIOACTIVE SEED GUIDED PARTIAL MASTECTOMY WITH AXILLARY SENTINEL LYMPH NODE BIOPSY Left 05/25/2015   Procedure: LEFT BREAST LUMPECTOMY WITH RADIOACTIVE SEED AND SENTINEL LYMPH NODE BIOPSY;  Surgeon: Autumn Messing III, MD;  Location: [facility];  Service: General;  Laterality: Left;  . UPPER GASTROINTESTINAL ENDOSCOPY       OB History    Gravida  1   Para      Term      Preterm      AB  1   Living        SAB       TAB  1   Ectopic      Multiple      Live Births               Home Medications    Prior to Admission medications   Medication Sig Start Date End Date Taking? Authorizing Provider  acetaminophen (TYLENOL) 325 MG tablet Take 650 mg by mouth every 6 (six) hours as needed (for pain.).   Yes [provider]  carboxymethylcellulose (REFRESH PLUS) 0.5 % SOLN Place 1 drop into both eyes 3 (three) times daily as needed (dry eyes.).   Yes [provider]  Cholecalciferol (VITAMIN D-3) 125 MCG (5000 UT) TABS Take 5,000 Units by mouth daily.   Yes [provider]  ferrous sulfate 325 (65 FE) MG EC tablet Take 1 tablet (325 mg total) by mouth 2 (two) times daily. Patient taking differently: Take 325 mg by mouth every evening. With meal 09/02/18 09/02/19 Yes Gatha Mayer, MD  fluticasone [facility]) 50  MCG/ACT nasal spray Place 1 spray into both nostrils daily.    Yes [provider]  LAMICTAL 200 MG tablet Take 0.5 tablets (100 mg total) by mouth daily. 12/10/18  Yes Rita Ohara, MD  Lutein-Zeaxanthin 25-5 MG CAPS Take 1 capsule by mouth daily.   Yes [provider]  MAGNESIUM CITRATE PO Take 420 mg by mouth daily.   Yes [provider]  ondansetron (ZOFRAN ODT) 4 MG disintegrating tablet Take 1 tablet (4 mg total) by mouth every 8 (eight) hours as needed for nausea. 06/30/18  Yes Larene Pickett, PA-C  pantoprazole (PROTONIX) 40 MG tablet Take 1 tablet (40 mg total) by mouth 2 (two) times daily. 07/16/18  Yes Esterwood, Amy S, PA-C  promethazine (PHENERGAN) 25 MG tablet Take 1 tablet (25 mg total) by mouth every 6 (six) hours as needed for nausea or vomiting. 01/07/18  Yes Rita Ohara, MD  Zoledronic Acid (ZOMETA IV) Inject 1 Dose into the vein every 6 (six) months.    Yes [provider]  benzonatate (TESSALON) 200 MG capsule Take 1 capsule (200 mg total) by mouth 2 (two) times daily as needed for cough. Patient not taking: Reported on  03/03/2019 11/24/18   Denita Lung, MD    Family History Family History  Problem Relation Age of Onset  . Heart disease Father   . Skin cancer Father        non melanoma - farmer  . Hypertension Mother   . Arthritis Mother        osteoarthritis  . Macular degeneration Mother        wet and dry  . Colon cancer Mother 98       colon cancer at age 23  . Osteoporosis Sister        osteopenia (states it resolved)  . Lung cancer Maternal Aunt        lung cancer  . Cancer Paternal Aunt 61       ? stomach cancer  . Autism Sister   . Mental retardation Sister   . Blindness Sister        secondary to retrolental fibroplasia  . Osteoporosis Sister   . Goiter Paternal Aunt   . Diabetes Paternal Aunt   . Esophageal cancer Neg Hx   . Rectal cancer Neg Hx   . Stomach cancer Neg Hx     Social History Social History   Tobacco Use  . Smoking status: Never Smoker  . Smokeless tobacco: Never Used  Substance Use Topics  . Alcohol use: Yes    Alcohol/week: 0.0 standard drinks    Comment: 1-2 glasses of wine a month  . Drug use: No     Allergies   Clindamycin/lincomycin; Nsaids; Other; and Adhesive [tape]   Review of Systems Review of Systems  Skin: Positive for color change and wound.  Neurological: Positive for headaches. Negative for dizziness, syncope, weakness and numbness.  All other systems reviewed and are negative.    Physical Exam Updated Vital Signs BP (!) 123/56   Pulse 78   Temp 98.7 F (37.1 C) (Oral)   Resp 19   Ht 5\' 7"  (1.702 m)   Wt 59 kg   SpO2 100%   BMI 20.36 kg/m   Physical Exam Vitals signs and nursing note reviewed.  Constitutional:      General: She is not in acute distress.    Appearance: She is well-developed.  HENT:     Head: Normocephalic.  Comments: Periorbital bruising around the right eye.  Swelling to the upper eyelid.  No tenderness or bruising to the mastoid.  No hemotympanum. Neck:     Musculoskeletal: Neck supple.      Comments: No midline or paraspinal tenderness.  Full range of motion without any difficulty. Cardiovascular:     Rate and Rhythm: Normal rate and regular rhythm.     Heart sounds: Normal heart sounds. No murmur.  Pulmonary:     Effort: Pulmonary effort is normal. No respiratory distress.     Breath sounds: Normal breath sounds.  Abdominal:     General: There is no distension.     Palpations: Abdomen is soft.     Tenderness: There is no abdominal tenderness.  Skin:    General: Skin is warm and dry.  Neurological:     Mental Status: She is alert and oriented to person, place, and time.     Comments: Alert, oriented, thought content appropriate, able to give a coherent history. Speech is clear and goal oriented, able to follow commands.  Cranial Nerves:  II:  Peripheral visual fields grossly normal, pupils equal, round, reactive to light III, IV, VI: EOM intact bilaterally, ptosis not present V,VII: smile symmetric, eyes kept closed tightly against resistance, facial light touch sensation equal VIII: hearing grossly normal IX, X: symmetric soft palate movement, uvula elevates symmetrically  XI: bilateral shoulder shrug symmetric and strong XII: midline tongue extension 5/5 muscle strength in upper and lower extremities bilaterally including strong and equal grip strength and dorsiflexion/plantar flexion Sensory to light touch normal in all four extremities.  Normal finger-to-nose and rapid alternating movements.      ED Treatments / Results  Labs (all labs ordered are listed, but only abnormal results are displayed) Labs Reviewed  CBC WITH DIFFERENTIAL/PLATELET - Abnormal; Notable for the following components:      Result Value   Lymphs Abs 0.4 (*)    All other components within normal limits  BASIC METABOLIC PANEL - Abnormal; Notable for the following components:   Glucose, Bld 109 (*)    All other components within normal limits    EKG EKG Interpretation  Date/Time:   Wednesday Mar 03 2019 10:14:16 EDT Ventricular Rate:  76 PR Interval:    QRS Duration: 100 QT Interval:  383 QTC Calculation: 431 R Axis:   51 Text Interpretation:  Sinus rhythm Consider left atrial enlargement RSR' in V1 or V2, probably normal variant Confirmed by Davonna Belling [phone]) on 03/03/2019 10:16:12 AM   Radiology Ct Head Wo Contrast  Result Date: 03/03/2019 CLINICAL DATA:  Status post fall off a deck.  Patient face. EXAM: CT HEAD WITHOUT CONTRAST CT MAXILLOFACIAL WITHOUT CONTRAST TECHNIQUE: Multidetector CT imaging of the head and maxillofacial structures were performed using the standard protocol without intravenous contrast. Multiplanar CT image reconstructions of the maxillofacial structures were also generated. COMPARISON:  None. FINDINGS: CT HEAD FINDINGS Brain: No evidence of acute infarction, hemorrhage, hydrocephalus, extra-axial collection or mass lesion/mass effect. Vascular: No hyperdense vessel or unexpected calcification. Skull: Left frontal nondisplaced skull fracture extending to the roof of the left frontal sinus and extends through the anterior wall of the right frontal sinus. No fluid in the frontal sinuses. No pneumocephalus. Sinuses/Orbits: Visualized paranasal sinuses are clear. Visualized mastoid sinuses are clear. Visualized orbits demonstrate no focal abnormality. Other: Posterior scalp hematoma near the vertex. CT MAXILLOFACIAL FINDINGS Osseous: No fracture or mandibular dislocation. No destructive process. Degenerative disc disease with disc height loss at C3-4, C4-5,  C5-6 and C6-7 with bilateral facet arthropathy at C2-3 and C3-4. Orbits: Negative. No traumatic or inflammatory finding. Sinuses: No sinus air-fluid levels. Mild mucosal thickening of the ethmoid sinuses. Small mucous retention cyst in the left maxillary sinus. Soft tissues: Negative. IMPRESSION: 1. No acute intracranial pathology. 2. Left frontal nondisplaced skull fracture extending to the roof of  the left frontal sinus and extends through the anterior wall of the right frontal sinus. No fluid in the frontal sinuses. No pneumocephalus. 3.  No acute osseous injury of the maxillofacial bones. 4. Posterior scalp hematoma near the vertex. Electronically Signed   By: Kathreen Devoid   On: 03/03/2019 11:03   Ct Maxillofacial Wo Contrast  Result Date: 03/03/2019 CLINICAL DATA:  Status post fall off a deck.  Patient face. EXAM: CT HEAD WITHOUT CONTRAST CT MAXILLOFACIAL WITHOUT CONTRAST TECHNIQUE: Multidetector CT imaging of the head and maxillofacial structures were performed using the standard protocol without intravenous contrast. Multiplanar CT image reconstructions of the maxillofacial structures were also generated. COMPARISON:  None. FINDINGS: CT HEAD FINDINGS Brain: No evidence of acute infarction, hemorrhage, hydrocephalus, extra-axial collection or mass lesion/mass effect. Vascular: No hyperdense vessel or unexpected calcification. Skull: Left frontal nondisplaced skull fracture extending to the roof of the left frontal sinus and extends through the anterior wall of the right frontal sinus. No fluid in the frontal sinuses. No pneumocephalus. Sinuses/Orbits: Visualized paranasal sinuses are clear. Visualized mastoid sinuses are clear. Visualized orbits demonstrate no focal abnormality. Other: Posterior scalp hematoma near the vertex. CT MAXILLOFACIAL FINDINGS Osseous: No fracture or mandibular dislocation. No destructive process. Degenerative disc disease with disc height loss at C3-4, C4-5, C5-6 and C6-7 with bilateral facet arthropathy at C2-3 and C3-4. Orbits: Negative. No traumatic or inflammatory finding. Sinuses: No sinus air-fluid levels. Mild mucosal thickening of the ethmoid sinuses. Small mucous retention cyst in the left maxillary sinus. Soft tissues: Negative. IMPRESSION: 1. No acute intracranial pathology. 2. Left frontal nondisplaced skull fracture extending to the roof of the left frontal  sinus and extends through the anterior wall of the right frontal sinus. No fluid in the frontal sinuses. No pneumocephalus. 3.  No acute osseous injury of the maxillofacial bones. 4. Posterior scalp hematoma near the vertex. Electronically Signed   By: Kathreen Devoid   On: 03/03/2019 11:03    Procedures Procedures (including critical care time)  Medications Ordered in ED Medications - No data to display   Initial Impression / Assessment and Plan / ED Course  I have reviewed the triage vital signs and the nursing notes.  Pertinent labs & imaging results that were available during my care of the patient were reviewed by me and considered in my medical decision making (see chart for details).       Verdella Laidlaw Gardner is a 63 y.o. female who presents to ED for evaluation after a fall yesterday about 10 PM.  On exam, patient does have ecchymosis and swelling around the right eye as well as tenderness to the frontal bone and below the right eye. No focal neurologic deficits. Will obtain CT scan's to further evaluate extent of injuries.   CT's reviewed showing left frontal nondisplaced skull fracture extending to the roof of the left frontal sinus and extending through the anterior wall of the right frontal sinus.  Also notes posterior scalp hematoma.  No acute intracranial pathology.  12:33 PM - Spoke with Dr. Saintclair Halsted of Neurosurgery. Feels patient is safe for discharge with outpatient follow up in clinic in 1-2  weeks. Recommended giving closed head injury / concussion precautions as well.   Patient updated on plan of care.  I spoke to her husband on the phone as well.  Both are in agreement with neurosurgery follow-up, home care instructions and return precautions.  All questions were answered.  Patient discussed with Dr. Alvino Chapel who agrees with treatment plan.    Final Clinical Impressions(s) / ED Diagnoses   Final diagnoses:  Closed fracture of frontal bone, initial encounter [facility])     ED Discharge Orders    None       , Ozella Almond, PA-C 03/03/19 1247    Davonna Belling, MD 03/03/19 1531

## 2019-03-03 NOTE — ED Notes (Signed)
Purse given to husband per pt request, pt kept cell phone, RN aware

## 2019-03-03 NOTE — Telephone Encounter (Signed)
Spoke with Mr. Rhonda Gardner (and wife was with him).  Discussed concern for skull fracture given that injury was posterior, and the appearance of the bruising on her eyes. She reportedly did not lose consciousness.  Had slipped on a wet deck taking the dog out. She appears to be acting normally, able to log on to her Mychart.  She did have some nausea and took 1/2 of a promethazine, along with tylenol.  Feeling better this morning.  Spoke to him regarding limitations of ability to do full neuro exam through video visit, and the potential risks of not being treated.  Discussed safety measures in place at [facility], and that I feel she should be evaluated in ER, will likely need CT scan (along with full exam).  He is in agreement.

## 2019-03-03 NOTE — Discharge Instructions (Signed)
It was my pleasure taking care of you today!  Ice area as needed for swelling.  Tylenol as needed for pain.   Please call Dr. Windy Carina office to schedule a follow up appointment in the next week or two.   See head injury instructions attached.   Return to ER for vomiting, change in mental status, new or worsening symptoms, any additional concerns.

## 2019-03-03 NOTE — ED Triage Notes (Signed)
Pt reports falling yesterday when walking her dog yesterday. States her feet just slipped out underneath her. Reports her PCP sent her to be evaluated. Denies any blood thinners. Endorses head pain, has bruising to bilateral eyes.

## 2019-03-03 NOTE — ED Notes (Signed)
Patient verbalizes understanding of discharge instructions. Opportunity for questioning and answers were provided. Armband removed by staff, pt discharged from ED.  

## 2019-04-06 ENCOUNTER — Inpatient Hospital Stay: Payer: BC Managed Care – PPO | Attending: Hematology

## 2019-04-06 ENCOUNTER — Inpatient Hospital Stay: Payer: BC Managed Care – PPO

## 2019-04-06 ENCOUNTER — Other Ambulatory Visit: Payer: Self-pay

## 2019-04-06 VITALS — BP 118/65 | HR 72 | Temp 98.6°F | Resp 16

## 2019-04-06 DIAGNOSIS — Z923 Personal history of irradiation: Secondary | ICD-10-CM | POA: Diagnosis not present

## 2019-04-06 DIAGNOSIS — M818 Other osteoporosis without current pathological fracture: Secondary | ICD-10-CM

## 2019-04-06 DIAGNOSIS — Z9221 Personal history of antineoplastic chemotherapy: Secondary | ICD-10-CM | POA: Diagnosis not present

## 2019-04-06 DIAGNOSIS — Z171 Estrogen receptor negative status [ER-]: Secondary | ICD-10-CM | POA: Diagnosis not present

## 2019-04-06 DIAGNOSIS — C50412 Malignant neoplasm of upper-outer quadrant of left female breast: Secondary | ICD-10-CM

## 2019-04-06 DIAGNOSIS — C8290 Follicular lymphoma, unspecified, unspecified site: Secondary | ICD-10-CM

## 2019-04-06 DIAGNOSIS — C8209 Follicular lymphoma grade I, extranodal and solid organ sites: Secondary | ICD-10-CM | POA: Insufficient documentation

## 2019-04-06 LAB — CMP (CANCER CENTER ONLY)
ALT: 27 U/L (ref 0–44)
AST: 26 U/L (ref 15–41)
Albumin: 4.4 g/dL (ref 3.5–5.0)
Alkaline Phosphatase: 53 U/L (ref 38–126)
Anion gap: 9 (ref 5–15)
BUN: 16 mg/dL (ref 8–23)
CO2: 27 mmol/L (ref 22–32)
Calcium: 8.9 mg/dL (ref 8.9–10.3)
Chloride: 103 mmol/L (ref 98–111)
Creatinine: 1.03 mg/dL — ABNORMAL HIGH (ref 0.44–1.00)
GFR, Est AFR Am: >60 mL/min (ref >60)
GFR, Estimated: 57 mL/min — ABNORMAL LOW (ref >60)
Glucose, Bld: 89 mg/dL (ref 70–99)
Potassium: 4 mmol/L (ref 3.5–5.1)
Sodium: 139 mmol/L (ref 135–145)
Total Bilirubin: 0.4 mg/dL (ref 0.3–1.2)
Total Protein: 6.9 g/dL (ref 6.5–8.1)

## 2019-04-06 LAB — CBC WITH DIFFERENTIAL/PLATELET
Abs Immature Granulocytes: 0.01 10*3/uL (ref 0.00–0.07)
Basophils Absolute: 0 10*3/uL (ref 0.0–0.1)
Basophils Relative: 1 %
Eosinophils Absolute: 0.1 10*3/uL (ref 0.0–0.5)
Eosinophils Relative: 2 %
HCT: 37.3 % (ref 36.0–46.0)
Hemoglobin: 12.4 g/dL (ref 12.0–15.0)
Immature Granulocytes: 0 %
Lymphocytes Relative: 11 %
Lymphs Abs: 0.5 10*3/uL — ABNORMAL LOW (ref 0.7–4.0)
MCH: 32 pg (ref 26.0–34.0)
MCHC: 33.2 g/dL (ref 30.0–36.0)
MCV: 96.4 fL (ref 80.0–100.0)
Monocytes Absolute: 0.5 10*3/uL (ref 0.1–1.0)
Monocytes Relative: 10 %
Neutro Abs: 3.7 10*3/uL (ref 1.7–7.7)
Neutrophils Relative %: 76 %
Platelets: 161 10*3/uL (ref 150–400)
RBC: 3.87 MIL/uL (ref 3.87–5.11)
RDW: 13 % (ref 11.5–15.5)
WBC: 4.8 10*3/uL (ref 4.0–10.5)
nRBC: 0 % (ref 0.0–0.2)

## 2019-04-06 MED ORDER — SODIUM CHLORIDE 0.9 % IV SOLN
Freq: Once | INTRAVENOUS | Status: AC
  Administered 2019-04-06: 14:00:00 via INTRAVENOUS
  Filled 2019-04-06: qty 250

## 2019-04-06 MED ORDER — ZOLEDRONIC ACID 4 MG/100ML IV SOLN
4.0000 mg | Freq: Once | INTRAVENOUS | Status: AC
  Administered 2019-04-06: 4 mg via INTRAVENOUS
  Filled 2019-04-06: qty 100

## 2019-04-06 NOTE — Patient Instructions (Signed)

## 2019-04-13 ENCOUNTER — Encounter: Payer: Self-pay | Admitting: Family Medicine

## 2019-04-13 NOTE — Patient Instructions (Addendum)
  HEALTH MAINTENANCE RECOMMENDATIONS:  It is recommended that you get at least 30 minutes of aerobic exercise at least 5 days/week (for weight loss, you may need as much as 60-90 minutes). This can be any activity that gets your heart rate up. This can be divided in 10-15 minute intervals if needed, but try and build up your endurance at least once a week.  Weight bearing exercise is also recommended twice weekly.  Eat a healthy diet with lots of vegetables, fruits and fiber.  "Colorful" foods have a lot of vitamins (ie green vegetables, tomatoes, red peppers, etc).  Limit sweet tea, regular sodas and alcoholic beverages, all of which has a lot of calories and sugar.  Up to 1 alcoholic drink daily may be beneficial for women (unless trying to lose weight, watch sugars).  Drink a lot of water.  Calcium recommendations are 1200-1500 mg daily (1500 mg for postmenopausal women or women without ovaries), and vitamin D 1000 IU daily.  This should be obtained from diet and/or supplements (vitamins), and calcium should not be taken all at once, but in divided doses.  Monthly self breast exams and yearly mammograms for women over the age of 35 is recommended.  Sunscreen of at least SPF 30 should be used on all sun-exposed parts of the skin when outside between the hours of 10 am and 4 pm (not just when at beach or pool, but even with exercise, golf, tennis, and yard work!)  Use a sunscreen that says "broad spectrum" so it covers both UVA and UVB rays, and make sure to reapply every 1-2 hours.  Remember to change the batteries in your smoke detectors when changing your clock times in the spring and fall. Carbon monoxide detectors are recommended for your home.  Use your seat belt every time you are in a car, and please drive safely and not be distracted with cell phones and texting while driving.  Age 63 you will be due for Pneumovax 23--you can get this at your physical next year, or when you come for your  flu shot.  You now need high dose flu shots (since you will be 63 during flu season).  Increase your cardio (can be seated to get your heart rate up, if balance is still an issue).  Please ask your other doctors about whether or not you need to continue to take a PPI (protonix, etc)

## 2019-04-13 NOTE — Progress Notes (Signed)
Chief Complaint  Patient presents with  . Annual Exam    fasting annual exam with pap. No concerns. Is going to schedule eye appt.     Rhonda Gardner is a 63 y.o. female who presents for a complete physical.  She is complaining of vaginal dryness, only noted with intercourse.  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  She is s/p radiation. She had endoscopic [place] at [facility] last week (also had negative COVID test there). Has f/u visits scheduled for next week.   She fell 5/19 (slipped while walking her dog, fell hitting the back of her head).  CT scan showed left frontal nondisplaced skull fracture extending to the roof of the left frontal sinus and extending through the anterior wall of the right frontal sinus. Posterior scalp hematoma also noted.  No acute intracranial pathology. She saw Dr. Saintclair Halsted in follow-up. No treatment was needed (per pt, no report was received). She reports that she lost her sense of taste and smell related to the fall (immediately after).  She has seen Dr. Benjamine Mola about this.  Was told that if it doesn't come back within a month, it might not return.  She can taste some sweet, but not other flavors.  Osteoporosis:  Managed by Dr. Jana Hakim with zoledronate infusions, every 6 months, last was last week.   Breast Cancer:  Under the care of Dr. Jana Hakim, no new concerns (had negative PET scan as part of eval for her lymphoma, no evidence of any recurrent disease).  Bipolar disorder: Diagnosed age 40 or 36. She has been stable on her current regimen of lamictal. Moods are well controlled, no side effects. She previously was under the care of Dr.Parrish Caprice Beaver and Lynne Leader retired).  We have been managing these medications, and she continues to do well. She was getting her medications from [email or web address]", from [place]. She has been getting it here once she met her deductible. She reports the medication from [place] is a brand, but  looks different than the branded medication in the [place]. Hasn't tolerated generic lamictal in the states (myalgias with generics here); denies any side effects or myalgias. Moods are good. She will be going on Medicare in August, so will let us know how she wants [place] to handle her next prescription.  Immunization History  Administered Date(s) Administered  . DTaP 10/14/1954, 10/15/1955, 10/15/1959  . Gamma Globulin 08/23/1988  . Hepatitis A 11/02/1996, 09/29/2002  . Hepatitis B 11/11/2007  . IPV 08/15/1988  . Influenza Split 11/11/2007, 07/13/2013  . Influenza,inj,Quad PF,6+ Mos 05/30/2016, 07/17/2017, 07/04/2018  . Influenza-Unspecified 07/31/2015  . MMR 04/29/2003  . Measles 07/25/1988  . Meningococcal Polysaccharide 07/08/1988  . Pneumococcal Conjugate-13 04/08/2016  . Rubella 07/25/1988  . Td 10/15/1987, 11/02/1996, 01/23/1998  . Tdap 11/11/2007, 01/20/2013  . Typhoid Live 11/11/2007  . Typhoid Parenteral 07/25/1988, 08/23/1988  . Zoster 12/01/2014  . Zoster Recombinat (Shingrix) 12/04/2017, 02/19/2018   Last Pap smear: 11/2014--normal, no high risk HPV detected Last mammogram:01/2019 Last colonoscopy:06/2016--normal, internal hemorrhoids Last DEXA:06/2018 (T-1.8 at spine, improved from 06/2016 T-2.5) Dentist:every 9 months Ophtho:yearly, due Exercise:some balance issues after her fall, slowly working back up her walking.  Walks 5-6 days/week, 10-15 minutes.  Vitamin D-OH 61 on 12/01/14 Lipids Lab Results  Component Value Date   CHOL 208 (H) 04/10/2017   HDL 102 04/10/2017   LDLCALC 88 04/10/2017   TRIG 89 04/10/2017   CHOLHDL 2.0 04/10/2017    Thyroid screen: Lab Results  Component Value Date  TSH 2.37 10/03/2017   Past Medical History:  Diagnosis Date  . Allergy 2006   chemical cauterization in spring 2006, and 2nd treatment with good results.(Dr.Shoemaker)  . Bipolar disorder ([place])   . Bleeding ulcer   . Blood transfusion without reported diagnosis 07/2017    Bleeding ulcer  . Breast cancer ([place]) 12/2014   invasive ductal carcinoma (LEFT)  . Breast cancer of upper-outer quadrant of left female breast ([place]) 12/22/2014  . Breast hematoma 12/19/2015   had left breast aspiration of 4cm hematoma-path indicates benign hematoma  . C. difficile colitis   . C. difficile diarrhea   . Depression   . Follicular lymphoma ([place]) 10/2018   of duodenum, treated at [facility] (radiation)  . Osteoporosis 06/20/2016   T-2.5'@spine'  on Dexa ([facility])  . Plantar fasciitis   . PONV (postoperative nausea and vomiting)   . Symptomatic anemia 07/16/2017    Past Surgical History:  Procedure Laterality Date  . BIOPSY  07/03/2018   Procedure: BIOPSY;  Surgeon: Gatha Mayer, MD;  Location: [facility];  Service: Endoscopy;;  . BIOPSY  09/02/2018   Procedure: BIOPSY;  Surgeon: Gatha Mayer, MD;  Location: [facility];  Service: Endoscopy;;  . BIOPSY  10/22/2018   Procedure: BIOPSY;  Surgeon: Milus Banister, MD;  Location: [facility] ENDOSCOPY;  Service: Endoscopy;;  . BREAST BIOPSY Bilateral    benign and a right stereotatic breast biopsy.  . COLONOSCOPY    . ESOPHAGOGASTRODUODENOSCOPY N/A 07/16/2017   Procedure: ESOPHAGOGASTRODUODENOSCOPY (EGD);  Surgeon: Ladene Artist, MD;  Location: [facility] ENDOSCOPY;  Service: Endoscopy;  Laterality: N/A;  . ESOPHAGOGASTRODUODENOSCOPY N/A 10/22/2018   Procedure: ESOPHAGOGASTRODUODENOSCOPY (EGD);  Surgeon: Milus Banister, MD;  Location: [facility];  Service: Endoscopy;  Laterality: N/A;  . ESOPHAGOGASTRODUODENOSCOPY (EGD) WITH PROPOFOL N/A 07/03/2018   Procedure: ESOPHAGOGASTRODUODENOSCOPY (EGD) WITH PROPOFOL;  Surgeon: Gatha Mayer, MD;  Location: [facility];  Service: Endoscopy;  Laterality: N/A;  . ESOPHAGOGASTRODUODENOSCOPY (EGD) WITH PROPOFOL N/A 09/02/2018   Procedure: ESOPHAGOGASTRODUODENOSCOPY (EGD) WITH PROPOFOL;  Surgeon: Gatha Mayer, MD;  Location: [facility];  Service: Endoscopy;  Laterality: N/A;  . EUS N/A 10/22/2018    Procedure: UPPER ENDOSCOPIC ULTRASOUND (EUS) RADIAL;  Surgeon: Milus Banister, MD;  Location: [facility];  Service: Endoscopy;  Laterality: N/A;  . fibroid excision  age 45   prolapsed fibroid removed  . FINE NEEDLE ASPIRATION  10/22/2018   Procedure: FINE NEEDLE ASPIRATION;  Surgeon: Milus Banister, MD;  Location: [facility]OS[facility];  Service: Endoscopy;;  . FOOT SURGERY Left 2014   Dr. Paulla Dolly  . FOOT SURGERY Right 08/2016   Dr. Paulla Dolly --Shortening Osteotomy with Fixation 1st Metatarsal   . MOUTH SURGERY  06/2017   [facility]   . PORT-A-CATH REMOVAL N/A 02/15/2016   Procedure: REMOVAL PORT-A-CATH;  Surgeon: Autumn Messing III, MD;  Location: [facility];  Service: General;  Laterality: N/A;  . RADIOACTIVE SEED GUIDED PARTIAL MASTECTOMY WITH AXILLARY SENTINEL LYMPH NODE BIOPSY Left 05/25/2015   Procedure: LEFT BREAST LUMPECTOMY WITH RADIOACTIVE SEED AND SENTINEL LYMPH NODE BIOPSY;  Surgeon: Autumn Messing III, MD;  Location: [facility];  Service: General;  Laterality: Left;  . UPPER GASTROINTESTINAL ENDOSCOPY      Social History   Socioeconomic History  . Marital status: Married    Spouse name: Not on file  . Number of children: Not on file  . Years of education: Not on file  . Highest education level: Not on file  Occupational History  .  Not on file  Social Needs  . Financial resource strain: Not on file  . Food insecurity    Worry: Not on file    Inability: Not on file  . Transportation needs    Medical: Not on file    Non-medical: Not on file  Tobacco Use  . Smoking status: Never Smoker  . Smokeless tobacco: Never Used  Substance and Sexual Activity  . Alcohol use: Yes    Alcohol/week: 0.0 standard drinks    Comment: 1-2 glasses of wine a month  . Drug use: No  . Sexual activity: Yes    Partners: Male    Comment: postmenopausal  Lifestyle  . Physical activity    Days per week: Not on file    Minutes per session: Not on file  . Stress: Not on file   Relationships  . Social Herbalist on phone: Not on file    Gets together: Not on file    Attends religious service: Not on file    Active member of club or organization: Not on file    Attends meetings of clubs or organizations: Not on file    Relationship status: Not on file  . Intimate partner violence    Fear of current or ex partner: Not on file    Emotionally abused: Not on file    Physically abused: Not on file    Forced sexual activity: Not on file  Other Topics Concern  . Not on file  Social History Narrative   Married [name]), 1 dog (chocolate lab). Homemaker   Rare wine   No drugs, tobacco    Family History  Problem Relation Age of Onset  . Heart disease Father   . Skin cancer Father        non melanoma - farmer  . Hypertension Mother   . Arthritis Mother        osteoarthritis  . Macular degeneration Mother        wet and dry  . Colon cancer Mother 14       colon cancer at age 63  . Osteoporosis Sister        osteopenia (states it resolved)  . Lung cancer Maternal Aunt        lung cancer  . Cancer Paternal Aunt 66       ? stomach cancer  . Autism Sister   . Mental retardation Sister   . Blindness Sister        secondary to retrolental fibroplasia  . Osteoporosis Sister   . Goiter Paternal Aunt   . Diabetes Paternal Aunt   . Esophageal cancer Neg Hx   . Rectal cancer Neg Hx   . Stomach cancer Neg Hx     Outpatient Encounter Medications as of 04/14/2019  Medication Sig Note  . Cholecalciferol (VITAMIN D-3) 125 MCG (5000 UT) TABS Take 5,000 Units by mouth daily. 04/14/2019: 3000-5000 daily  . fluticasone (FLONASE) 50 MCG/ACT nasal spray Place 1 spray into both nostrils daily.    Marland Kitchen LAMICTAL 200 MG tablet Take 0.5 tablets (100 mg total) by mouth daily.   . Lutein-Zeaxanthin 25-5 MG CAPS Take 1 capsule by mouth daily.   Marland Kitchen MAGNESIUM CITRATE PO Take 420 mg by mouth daily.   . Zoledronic Acid (ZOMETA IV) Inject 1 Dose into the vein every 6 (six)  months.  08/28/2018: Last received: 08/12/2018  . acetaminophen (TYLENOL) 325 MG tablet Take 650 mg by mouth every 6 (six) hours as  needed (for pain.).   Marland Kitchen carboxymethylcellulose (REFRESH PLUS) 0.5 % SOLN Place 1 drop into both eyes 3 (three) times daily as needed (dry eyes.).   Marland Kitchen ferrous sulfate 325 (65 FE) MG EC tablet Take 1 tablet (325 mg total) by mouth 2 (two) times daily. (Patient not taking: Reported on 04/14/2019)   . ondansetron (ZOFRAN ODT) 4 MG disintegrating tablet Take 1 tablet (4 mg total) by mouth every 8 (eight) hours as needed for nausea. (Patient not taking: Reported on 04/14/2019)   . pantoprazole (PROTONIX) 40 MG tablet Take 1 tablet (40 mg total) by mouth 2 (two) times daily. (Patient not taking: Reported on 04/14/2019)   . promethazine (PHENERGAN) 25 MG tablet Take 1 tablet (25 mg total) by mouth every 6 (six) hours as needed for nausea or vomiting. (Patient not taking: Reported on 04/14/2019) 03/03/2019: Took 1/2 tablet   . [DISCONTINUED] benzonatate (TESSALON) 200 MG capsule Take 1 capsule (200 mg total) by mouth 2 (two) times daily as needed for cough. (Patient not taking: Reported on 03/03/2019)    No facility-administered encounter medications on file as of 04/14/2019.     Allergies  Allergen Reactions  . Clindamycin/Lincomycin Diarrhea    Resulted in C-diff  . Nsaids Other (See Comments)    History of bleeding ulcers   . Other Nausea Only    Stronger pain meds  . Adhesive [Tape] Rash    Pt is sensitive to any kind of adhesive tape products.    ROS: The patient denies anorexia, fever, headaches, decreased hearing, ear pain, sore throat, breast concerns, chest pain, palpitations, dizziness, syncope, dyspnea on exertion, cough, swelling, nausea, vomiting, diarrhea, constipation, abdominal pain, melena, hematochezia, indigestion/heartburn, hematuria, incontinence, dysuria, vaginal bleeding, discharge, odor or itch, genital lesions, joint pains, numbness, tingling), weakness,  tremor, suspicious skin lesions, depression, anxiety, abnormal bleeding/bruising, or enlarged lymph nodes. No further GI problems. Vaginal dryness with intercourse. Some balance problems since her fall and skull fracture, gradually improving. No headaches.  Loss of smell and taste related to the injury, per HPI. Intentional weight loss (eating less)   PHYSICAL EXAM:  BP 116/70   Pulse 72   Temp 98.2 F (36.8 C) (Temporal)   Ht '5\' 7"'  (1.702 m)   Wt 131 lb 3.2 oz (59.5 kg)   BMI 20.55 kg/m   Wt Readings from Last 3 Encounters:  04/14/19 131 lb 3.2 oz (59.5 kg)  03/03/19 130 lb (59 kg)  11/24/18 139 lb 3.2 oz (63.1 kg)   149# at her CPE last year  General Appearance:   Alert, cooperative, no distress, appears stated age, in good spirits.  Head:   Normocephalic, without obvious abnormality, atraumatic  Eyes:   PERRL, conjunctiva/corneas clear, EOM's intact, fundi  benign  Ears:   Normal TM's and external ear canals.   Nose:  Not examined (wearing mask due to COVID-19 pandemic)  Throat:  Not examined (wearing mask due to COVID-19 pandemic)  Neck:  Supple, no lymphadenopathy; thyroid: noenlargement/ tenderness/nodules; no carotid bruit or JVD  Back:  Spine nontender, no curvature, ROM normal, no CVA tenderness  Lungs:   Clear to auscultation bilaterally without wheezes, rales orronchi; respirations unlabored  Chest Wall:   No tenderness or deformity  Heart:   Regular rate and rhythm, S1 and S2 normal, no murmur, rub  or gallop  Breast Exam:   No tenderness, nipple discharge or inversion. No axillary lymphadenopathy. WHSS at left lateral breast. No masses.  Abdomen:   Soft, non-tender, nondistended, normoactive  bowel sounds,   no masses, no hepatosplenomegaly  Genitalia:   Normal external genitalia without lesions, mild atrophic changes. BUS and vagina normal; no cervical motion tenderness. No abnormal vaginal discharge.Nulliparous  cervix, no lesions.  Pap obtained. Uterus and adnexa not enlarged, nontender, no masses.  Rectal:   Normal tone, no masses or tenderness; heme negative stool  Extremities:  No clubbing, cyanosis or edema  Pulses:  2+ and symmetric all extremities  Skin:  Skin color, texture, turgor normal, no rashes or lesions.  Lymph nodes:  Cervical, supraclavicular, and axillary nodes normal  Neurologic:  CNII-XII intact, normal strength, sensation and gait; reflexes 2+ and symmetric throughout  Psych: Normal mood, affect, hygiene and grooming    Labs recently performed by oncologist:   Chemistry      Component Value Date/Time   NA 139 04/06/2019 1308   NA 140 09/12/2017 1045   K 4.0 04/06/2019 1308   K 4.3 09/12/2017 1045   CL 103 04/06/2019 1308   CO2 27 04/06/2019 1308   CO2 24 09/12/2017 1045   BUN 16 04/06/2019 1308   BUN 16.3 09/12/2017 1045   CREATININE 1.03 (H) 04/06/2019 1308   CREATININE 0.8 09/12/2017 1045      Component Value Date/Time   CALCIUM 8.9 04/06/2019 1308   CALCIUM 9.5 09/12/2017 1045   ALKPHOS 53 04/06/2019 1308   ALKPHOS 67 09/12/2017 1045   AST 26 04/06/2019 1308   AST 21 09/12/2017 1045   ALT 27 04/06/2019 1308   ALT 17 09/12/2017 1045   BILITOT 0.4 04/06/2019 1308   BILITOT 0.26 09/12/2017 1045     Lab Results  Component Value Date   WBC 4.8 04/06/2019   HGB 12.4 04/06/2019   HCT 37.3 04/06/2019   MCV 96.4 04/06/2019   PLT 161 04/06/2019    ASSESSMENT/PLAN:  Annual physical exam - Plan: POCT Urinalysis DIP (Proadvantage Device), Cytology - PAP([place])  Bipolar disorder in full remission, most recent episode unspecified type ([place])  Follicular lymphoma grade I, unspecified body region [facility])   Other osteoporosis without current pathological fracture - continue current treatments per onc.  Discussed Ca, D, weight-bearing exercise    Loss of smell - counseled re: importance of working smoke detectors,  looking at dates on foods (can't tell if spoiled by smell)  Atrophic vaginitis - discussed lubricants (OTC and including coconut oil)    Discussed monthly self breast exams and yearly mammograms; at least 30 minutes of aerobic activity at least 5 days/week, weight-bearing exercise at least 2x/week; proper sunscreen use reviewed; healthy diet, including goals of calcium and vitamin D intake and alcohol recommendations (less than or equal to 1 drink/day) reviewed; regular seatbelt use; changing batteries in smoke detectors. Immunization recommendations discussed--continue yearly flu shots. Pneumovax at age 58 (can wait until next year or get when she comes for her flu shot).Colonoscopy recommendations reviewed--UTD. Pap done today.  F/u 1 year, sooner prn

## 2019-04-14 ENCOUNTER — Encounter: Payer: Self-pay | Admitting: Family Medicine

## 2019-04-14 ENCOUNTER — Other Ambulatory Visit: Payer: Self-pay

## 2019-04-14 ENCOUNTER — Ambulatory Visit (INDEPENDENT_AMBULATORY_CARE_PROVIDER_SITE_OTHER): Payer: BC Managed Care – PPO | Admitting: Family Medicine

## 2019-04-14 ENCOUNTER — Other Ambulatory Visit (HOSPITAL_COMMUNITY)
Admission: RE | Admit: 2019-04-14 | Discharge: 2019-04-14 | Disposition: A | Discharge status: Home / Self Care | Payer: BC Managed Care – PPO | Source: Ambulatory Visit | Attending: Family Medicine | Admitting: Family Medicine

## 2019-04-14 VITALS — BP 116/70 | HR 72 | Temp 98.2°F | Ht 67.0 in | Wt 131.2 lb

## 2019-04-14 DIAGNOSIS — C8200 Follicular lymphoma grade I, unspecified site: Secondary | ICD-10-CM

## 2019-04-14 DIAGNOSIS — Z0000 Encounter for general adult medical examination without abnormal findings: Secondary | ICD-10-CM

## 2019-04-14 DIAGNOSIS — F3170 Bipolar disorder, currently in remission, most recent episode unspecified: Secondary | ICD-10-CM | POA: Diagnosis not present

## 2019-04-14 DIAGNOSIS — N952 Postmenopausal atrophic vaginitis: Secondary | ICD-10-CM

## 2019-04-14 DIAGNOSIS — M818 Other osteoporosis without current pathological fracture: Secondary | ICD-10-CM | POA: Diagnosis not present

## 2019-04-14 DIAGNOSIS — R430 Anosmia: Secondary | ICD-10-CM

## 2019-04-14 LAB — POCT URINALYSIS DIP (PROADVANTAGE DEVICE)
Bilirubin, UA: NEGATIVE
Blood, UA: NEGATIVE
Glucose, UA: NEGATIVE mg/dL
Nitrite, UA: NEGATIVE
Protein Ur, POC: NEGATIVE mg/dL
Specific Gravity, Urine: 1
Urobilinogen, Ur: NEGATIVE
pH, UA: 8 (ref 5.0–8.0)

## 2019-04-14 LAB — HM PAP SMEAR: HM Pap smear: NEGATIVE

## 2019-04-14 LAB — RESULTS CONSOLE HPV: CHL HPV: NEGATIVE

## 2019-04-20 LAB — CYTOLOGY - PAP
Diagnosis: NEGATIVE
HPV: NOT DETECTED

## 2019-05-06 ENCOUNTER — Telehealth: Payer: Self-pay

## 2019-05-06 ENCOUNTER — Other Ambulatory Visit: Payer: Self-pay

## 2019-05-06 ENCOUNTER — Telehealth: Payer: Self-pay | Admitting: Physician Assistant

## 2019-05-06 MED ORDER — PANTOPRAZOLE SODIUM 40 MG PO TBEC: 40.0000 mg | DELAYED_RELEASE_TABLET | Freq: Two times a day (BID) | ORAL | 2 refills | Status: DC

## 2019-05-06 NOTE — Telephone Encounter (Signed)
Prescription for Protonix sent to [facility] on [address] per patient request.  Pt aware.

## 2019-05-06 NOTE — Telephone Encounter (Signed)
Patient would like a refill for pantoprazole (PROTONIX) 40 MG sent to [facility] on [address]

## 2019-05-13 ENCOUNTER — Other Ambulatory Visit: Payer: Self-pay | Admitting: Family Medicine

## 2019-05-13 DIAGNOSIS — F3170 Bipolar disorder, currently in remission, most recent episode unspecified: Secondary | ICD-10-CM

## 2019-05-13 NOTE — Telephone Encounter (Signed)
[facility] is requesting to fill pt lamictal. Please advise [facility]

## 2019-07-08 ENCOUNTER — Other Ambulatory Visit (INDEPENDENT_AMBULATORY_CARE_PROVIDER_SITE_OTHER): Payer: Medicare Other

## 2019-07-08 ENCOUNTER — Other Ambulatory Visit: Payer: Self-pay

## 2019-07-08 DIAGNOSIS — Z23 Encounter for immunization: Secondary | ICD-10-CM

## 2019-07-22 ENCOUNTER — Encounter: Payer: Self-pay | Admitting: Oncology

## 2019-08-16 ENCOUNTER — Encounter: Payer: Self-pay | Admitting: Family Medicine

## 2019-08-16 DIAGNOSIS — F3170 Bipolar disorder, currently in remission, most recent episode unspecified: Secondary | ICD-10-CM

## 2019-08-16 MED ORDER — LAMOTRIGINE 200 MG PO TABS: ORAL_TABLET | ORAL | 2 refills | Status: DC

## 2019-08-21 ENCOUNTER — Telehealth: Payer: Self-pay | Admitting: Family Medicine

## 2019-08-21 NOTE — Telephone Encounter (Signed)
P.A. LAMICTAL

## 2019-09-03 ENCOUNTER — Other Ambulatory Visit: Payer: Self-pay

## 2019-09-08 NOTE — Telephone Encounter (Signed)
Called pharmacy went thru ins for $3 and pt picked up

## 2019-11-02 ENCOUNTER — Inpatient Hospital Stay: Payer: Medicare Other

## 2019-11-02 ENCOUNTER — Inpatient Hospital Stay: Payer: Medicare Other | Attending: Oncology

## 2019-11-02 ENCOUNTER — Other Ambulatory Visit: Payer: Self-pay

## 2019-11-02 VITALS — BP 123/66 | HR 94 | Temp 97.8°F | Resp 16

## 2019-11-02 DIAGNOSIS — C50412 Malignant neoplasm of upper-outer quadrant of left female breast: Secondary | ICD-10-CM | POA: Insufficient documentation

## 2019-11-02 DIAGNOSIS — M818 Other osteoporosis without current pathological fracture: Secondary | ICD-10-CM | POA: Insufficient documentation

## 2019-11-02 DIAGNOSIS — Z9221 Personal history of antineoplastic chemotherapy: Secondary | ICD-10-CM | POA: Diagnosis not present

## 2019-11-02 DIAGNOSIS — Z79899 Other long term (current) drug therapy: Secondary | ICD-10-CM | POA: Insufficient documentation

## 2019-11-02 DIAGNOSIS — C8209 Follicular lymphoma grade I, extranodal and solid organ sites: Secondary | ICD-10-CM | POA: Diagnosis not present

## 2019-11-02 DIAGNOSIS — Z923 Personal history of irradiation: Secondary | ICD-10-CM | POA: Diagnosis not present

## 2019-11-02 DIAGNOSIS — Z171 Estrogen receptor negative status [ER-]: Secondary | ICD-10-CM | POA: Diagnosis not present

## 2019-11-02 DIAGNOSIS — C8290 Follicular lymphoma, unspecified, unspecified site: Secondary | ICD-10-CM

## 2019-11-02 LAB — CBC WITH DIFFERENTIAL/PLATELET
Abs Immature Granulocytes: 0.01 10*3/uL (ref 0.00–0.07)
Basophils Absolute: 0 10*3/uL (ref 0.0–0.1)
Basophils Relative: 1 %
Eosinophils Absolute: 0.1 10*3/uL (ref 0.0–0.5)
Eosinophils Relative: 3 %
HCT: 39.6 % (ref 36.0–46.0)
Hemoglobin: 13.2 g/dL (ref 12.0–15.0)
Immature Granulocytes: 0 %
Lymphocytes Relative: 9 %
Lymphs Abs: 0.5 10*3/uL — ABNORMAL LOW (ref 0.7–4.0)
MCH: 32.7 pg (ref 26.0–34.0)
MCHC: 33.3 g/dL (ref 30.0–36.0)
MCV: 98 fL (ref 80.0–100.0)
Monocytes Absolute: 0.4 10*3/uL (ref 0.1–1.0)
Monocytes Relative: 7 %
Neutro Abs: 4.5 10*3/uL (ref 1.7–7.7)
Neutrophils Relative %: 80 %
Platelets: 193 10*3/uL (ref 150–400)
RBC: 4.04 MIL/uL (ref 3.87–5.11)
RDW: 13.2 % (ref 11.5–15.5)
WBC: 5.7 10*3/uL (ref 4.0–10.5)
nRBC: 0 % (ref 0.0–0.2)

## 2019-11-02 LAB — CMP (CANCER CENTER ONLY)
ALT: 24 U/L (ref 0–44)
AST: 25 U/L (ref 15–41)
Albumin: 4.6 g/dL (ref 3.5–5.0)
Alkaline Phosphatase: 51 U/L (ref 38–126)
Anion gap: 11 (ref 5–15)
BUN: 17 mg/dL (ref 8–23)
CO2: 26 mmol/L (ref 22–32)
Calcium: 8.8 mg/dL — ABNORMAL LOW (ref 8.9–10.3)
Chloride: 105 mmol/L (ref 98–111)
Creatinine: 0.84 mg/dL (ref 0.44–1.00)
GFR, Est AFR Am: >60 mL/min (ref >60)
GFR, Estimated: >60 mL/min (ref >60)
Glucose, Bld: 82 mg/dL (ref 70–99)
Potassium: 4 mmol/L (ref 3.5–5.1)
Sodium: 142 mmol/L (ref 135–145)
Total Bilirubin: 0.4 mg/dL (ref 0.3–1.2)
Total Protein: 7 g/dL (ref 6.5–8.1)

## 2019-11-02 MED ORDER — ZOLEDRONIC ACID 4 MG/100ML IV SOLN
INTRAVENOUS | Status: AC
  Filled 2019-11-02: qty 100

## 2019-11-02 MED ORDER — SODIUM CHLORIDE 0.9 % IV SOLN
INTRAVENOUS | Status: DC
  Filled 2019-11-02: qty 250

## 2019-11-02 MED ORDER — ZOLEDRONIC ACID 4 MG/100ML IV SOLN
4.0000 mg | Freq: Once | INTRAVENOUS | Status: AC
  Administered 2019-11-02: 14:00:00 4 mg via INTRAVENOUS

## 2019-11-02 NOTE — Patient Instructions (Signed)
Zoledronic Acid injection (Hypercalcemia, Oncology) What is this medicine? ZOLEDRONIC ACID (ZOE le dron ik AS id) lowers the amount of calcium loss from bone. It is used to treat too much calcium in your blood from cancer. It is also used to prevent complications of cancer that has spread to the bone. This medicine may be used for other purposes; ask your health care provider or pharmacist if you have questions. COMMON BRAND NAME(S): Zometa What should I tell my health care provider before I take this medicine? They need to know if you have any of these conditions:  aspirin-sensitive asthma  cancer, especially if you are receiving medicines used to treat cancer  dental disease or wear dentures  infection  kidney disease  receiving corticosteroids like dexamethasone or prednisone  an unusual or allergic reaction to zoledronic acid, other medicines, foods, dyes, or preservatives  pregnant or trying to get pregnant  breast-feeding How should I use this medicine? This medicine is for infusion into a vein. It is given by a health care professional in a hospital or clinic setting. Talk to your pediatrician regarding the use of this medicine in children. Special care may be needed. Overdosage: If you think you have taken too much of this medicine contact a poison control center or emergency room at once. NOTE: This medicine is only for you. Do not share this medicine with others. What if I miss a dose? It is important not to miss your dose. Call your doctor or health care professional if you are unable to keep an appointment. What may interact with this medicine?  certain antibiotics given by injection  NSAIDs, medicines for pain and inflammation, like ibuprofen or naproxen  some diuretics like bumetanide, furosemide  teriparatide  thalidomide This list may not describe all possible interactions. Give your health care provider a list of all the medicines, herbs, non-prescription  drugs, or dietary supplements you use. Also tell them if you smoke, drink alcohol, or use illegal drugs. Some items may interact with your medicine. What should I watch for while using this medicine? Visit your doctor or health care professional for regular checkups. It may be some time before you see the benefit from this medicine. Do not stop taking your medicine unless your doctor tells you to. Your doctor may order blood tests or other tests to see how you are doing. Women should inform their doctor if they wish to become pregnant or think they might be pregnant. There is a potential for serious side effects to an unborn child. Talk to your health care professional or pharmacist for more information. You should make sure that you get enough calcium and vitamin D while you are taking this medicine. Discuss the foods you eat and the vitamins you take with your health care professional. Some people who take this medicine have severe bone, joint, and/or muscle pain. This medicine may also increase your risk for jaw problems or a broken thigh bone. Tell your doctor right away if you have severe pain in your jaw, bones, joints, or muscles. Tell your doctor if you have any pain that does not go away or that gets worse. Tell your dentist and dental surgeon that you are taking this medicine. You should not have major dental surgery while on this medicine. See your dentist to have a dental exam and fix any dental problems before starting this medicine. Take good care of your teeth while on this medicine. Make sure you see your dentist for regular follow-up   appointments. What side effects may I notice from receiving this medicine? Side effects that you should report to your doctor or health care professional as soon as possible:  allergic reactions like skin rash, itching or hives, swelling of the face, lips, or tongue  anxiety, confusion, or depression  breathing problems  changes in vision  eye  pain  feeling faint or lightheaded, falls  jaw pain, especially after dental work  mouth sores  muscle cramps, stiffness, or weakness  redness, blistering, peeling or loosening of the skin, including inside the mouth  trouble passing urine or change in the amount of urine Side effects that usually do not require medical attention (report to your doctor or health care professional if they continue or are bothersome):  bone, joint, or muscle pain  constipation  diarrhea  fever  hair loss  irritation at site where injected  loss of appetite  nausea, vomiting  stomach upset  trouble sleeping  trouble swallowing  weak or tired This list may not describe all possible side effects. Call your doctor for medical advice about side effects. You may report side effects to FDA at 1-800-FDA-1088. Where should I keep my medicine? This drug is given in a hospital or clinic and will not be stored at home. NOTE: This sheet is a summary. It may not cover all possible information. If you have questions about this medicine, talk to your doctor, pharmacist, or health care provider.  2020 Elsevier/Gold Standard (2014-02-26 14:19:39)  

## 2019-11-02 NOTE — Progress Notes (Signed)
Per Dr. Jana Hakim, ok to proceed with zometa with calcium of 8.8. Pt instructed to take 2 Tums 3x/day for 3 days, then begin regular calcium supplementation per Dr. Jana Hakim. Pt verbalized understanding.

## 2020-02-01 LAB — HM MAMMOGRAPHY

## 2020-02-02 ENCOUNTER — Encounter: Payer: Self-pay | Admitting: *Deleted

## 2020-02-07 ENCOUNTER — Other Ambulatory Visit: Payer: Self-pay

## 2020-02-07 DIAGNOSIS — C50412 Malignant neoplasm of upper-outer quadrant of left female breast: Secondary | ICD-10-CM

## 2020-02-07 NOTE — Progress Notes (Signed)
[place]  [phone] [phone]     ID: Rhonda Gardner DOB: May 03, 1956  MR#: [id number]  VQM#:[id number]  Patient Care Team: Rita Ohara, MD as PCP - General (Family Medicine) Dionne Rossa, Virgie Dad, MD as Consulting Physician (Oncology) Mauro Kaufmann, RN as Registered Nurse Rockwell Germany, RN as Registered Nurse Jovita Kussmaul, MD as Consulting Physician (General Surgery) Festus Aloe, MD as Referring Physician (Hematology and Oncology) Berneice Gandy, MD as Physician Assistant (Hematology and Oncology) OTHER MD:   Rhonda Gardner did not show for her February 08, 2020 visit   CHIEF COMPLAINT: HER-2 positive breast cancer; low-grade follicular lymphoma  CURRENT TREATMENT:  Zoledronate   INTERVAL HISTORY: Rhonda Gardner was scheduled today for follow-up of her estrogen receptor negative breast cancer.   She has been on zoledronate every six months with good tolerance.  Her most recent dose was 11/02/2019.  Her most recent bone density testing on 07/13/2018 showed a T-score of -1.8.   Since her last visit, she underwent bilateral diagnostic mammography with tomography at [facility] on 02/01/2020 showing:   She fell off a deck on 03/03/2019 and hit her face. She underwent head and maxillofacial CT that day, showing: no acute intracranial pathology; left frontal nondisplaced skull fracture extending to roof of left frontal sinus and extends through anterior wall of right frontal sinus, no fluid in frontal sinuses or pneumocephalus; no acute osseous injury of maxillofacial bones; posterior scalp hematoma near vertex.  She also underwent upper endoscopy at [facility] on 08/30/2019. Pathology from the procedure ([id number]) showed: duodenal mucosa with reactive/reparative changes; no residual lymphoma.   REVIEW OF SYSTEMS: Rhonda Gardner    BREAST CANCER HISTORY: From the original intake note:  "Rhonda Gardner" had not had a physical exam in quite some time, and  had not had a mammogram since 2020, when she was evaluated by Dr. Tomi Bamberger 12/01/2014. Dr. Tomi Bamberger was able to palpate a 2 cm firm mass at the 3:00 position in the left breast. The patient herself has been aware of multiple breast masses and does have a history of fibrocystic change, with multiple prior benign biopsies. She did not feel this particular mass had changed recently.   Dr. Tomi Bamberger arranged for bilateral diagnostic mammography and left breast ultrasonography at the [facility] Center 12/15/2014. This shows the breast density to be category C. In the left breast upper outer quadrant there was a bilobed mass measuring approximately 2 cm. This was palpable. Ultrasound confirmed an irregular microlobulated and hypoechoic mass measuring 2.1 cm at the 2:00 location 6 cm from the nipple. There was no left axillary adenopathy.  Biopsy of the mass in question 12/19/2014 showed ([id number]) invasive ductal carcinoma, grade 2 or 3, estrogen and progesterone receptor negative, with an MIB-1 of 38%, and equivocal HER-2 amplification, the signals ratio being 1.57, the number per cell 4.25.  On 12/27/2014 the patient underwent bilateral breast MRI. The mass in question at the 2:00 location of the left breast measured 2.5 cm. It directly abuts the left pectoralis major. There is no enhancement in the muscle itself. There were no abnormal appearing lymph nodes. There was an incidentally noted 1 cm hepatic cyst at the dome of the right liver lobe.  Her subsequent history is as detailed above.   PAST MEDICAL HISTORY: Past Medical History:  Diagnosis Date  . Allergy 2006   chemical cauterization in spring 2006, and 2nd treatment with good results.(Dr.Shoemaker)  . Bipolar disorder ([place])   . Bleeding ulcer   .  Blood transfusion without reported diagnosis 07/2017   Bleeding ulcer  . Breast cancer ([place]) 12/2014   invasive ductal carcinoma (LEFT)  . Breast cancer of upper-outer quadrant of left female breast ([place])  12/22/2014  . Breast hematoma 12/19/2015   had left breast aspiration of 4cm hematoma-path indicates benign hematoma  . C. difficile colitis   . C. difficile diarrhea   . Depression   . Follicular lymphoma ([place]) 10/2018   of duodenum, treated at [facility] (radiation)  . Osteoporosis 06/20/2016   T-2.5'@spine'  on Dexa ([facility])  . Plantar fasciitis   . PONV (postoperative nausea and vomiting)   . Symptomatic anemia 07/16/2017    PAST SURGICAL HISTORY: Past Surgical History:  Procedure Laterality Date  . BIOPSY  07/03/2018   Procedure: BIOPSY;  Surgeon: Gatha Mayer, MD;  Location: [facility];  Service: Endoscopy;;  . BIOPSY  09/02/2018   Procedure: BIOPSY;  Surgeon: Gatha Mayer, MD;  Location: [facility];  Service: Endoscopy;;  . BIOPSY  10/22/2018   Procedure: BIOPSY;  Surgeon: Milus Banister, MD;  Location: [facility];  Service: Endoscopy;;  . BREAST BIOPSY Bilateral    benign and a right stereotatic breast biopsy.  . COLONOSCOPY    . ESOPHAGOGASTRODUODENOSCOPY N/A 07/16/2017   Procedure: ESOPHAGOGASTRODUODENOSCOPY (EGD);  Surgeon: Ladene Artist, MD;  [facility];  Service: Endoscopy;  Laterality: N/A;  . ESOPHAGOGASTRODUODENOSCOPY N/A 10/22/2018   Procedure: ESOPHAGOGASTRODUODENOSCOPY (EGD);  Surgeon: Milus Banister, MD;  Location: [facility];  Service: Endoscopy;  Laterality: N/A;  . ESOPHAGOGASTRODUODENOSCOPY (EGD) WITH PROPOFOL N/A 07/03/2018   Procedure: ESOPHAGOGASTRODUODENOSCOPY (EGD) WITH PROPOFOL;  Surgeon: Gatha Mayer, MD;  Location: [facility];  Service: Endoscopy;  Laterality: N/A;  . ESOPHAGOGASTRODUODENOSCOPY (EGD) WITH PROPOFOL N/A 09/02/2018   Procedure: ESOPHAGOGASTRODUODENOSCOPY (EGD) WITH PROPOFOL;  Surgeon: Gatha Mayer, MD;  Location: [facility];  Service: Endoscopy;  Laterality: N/A;  . EUS N/A 10/22/2018   Procedure: UPPER ENDOSCOPIC ULTRASOUND (EUS) RADIAL;  Surgeon: Milus Banister, MD;  Location: [facility]OS[facility];  Service:  Endoscopy;  Laterality: N/A;  . fibroid excision  age 37   prolapsed fibroid removed  . FINE NEEDLE ASPIRATION  10/22/2018   Procedure: FINE NEEDLE ASPIRATION;  Surgeon: Milus Banister, MD;  Location: [facility]OS[facility];  Service: Endoscopy;;  . FOOT SURGERY Left 2014   Dr. Paulla Dolly  . FOOT SURGERY Right 08/2016   Dr. Paulla Dolly --Shortening Osteotomy with Fixation 1st Metatarsal   . MOUTH SURGERY  06/2017   [facility]   . PORT-A-CATH REMOVAL N/A 02/15/2016   Procedure: REMOVAL PORT-A-CATH;  Surgeon: Autumn Messing III, MD;  Location: [facility];  Service: General;  Laterality: N/A;  . RADIOACTIVE SEED GUIDED PARTIAL MASTECTOMY WITH AXILLARY SENTINEL LYMPH NODE BIOPSY Left 05/25/2015   Procedure: LEFT BREAST LUMPECTOMY WITH RADIOACTIVE SEED AND SENTINEL LYMPH NODE BIOPSY;  Surgeon: Autumn Messing III, MD;  Location: [facility];  Service: General;  Laterality: Left;  . UPPER GASTROINTESTINAL ENDOSCOPY      FAMILY HISTORY Family History  Problem Relation Age of Onset  . Heart disease Father   . Skin cancer Father        non melanoma - farmer  . Arthritis Mother        osteoarthritis  . Macular degeneration Mother        wet and dry  . Colon cancer Mother 75       colon cancer at age 66  . Hypertension Mother  resolved with weight loss  . Osteoporosis Sister        osteopenia (states it resolved)  . Lung cancer Maternal Aunt        lung cancer  . Cancer Paternal Aunt 98       ? stomach cancer  . Mental retardation Sister   . Blindness Sister        secondary to retrolental fibroplasia  . Osteoporosis Sister   . Goiter Paternal Aunt   . Diabetes Paternal Aunt   . Esophageal cancer Neg Hx   . Rectal cancer Neg Hx   . Stomach cancer Neg Hx    the patient's father lived to be 95. He had a history of nonmelanoma skin cancers. [name] mother is living at age 30. She was diagnosed with colon cancer in her 35s. A paternal aunt may have had stomach cancer. Maternal aunt  developed lung cancer in her 64s. The patient has one brother, 2 sisters. One of her sisters is blind and mentally retarded. There is no history of breast or ovarian cancer in the family.   GYNECOLOGIC HISTORY:  No LMP recorded. Patient is postmenopausal. Menarche age 40. The patient is GX P0. She stopped having periods in her early 65s. She did not take hormone replacement. She took oral contraceptives remotely for over 5 years with no complications.   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 Her husband [name] worked in IT originally but now runs a family firm. It's just the 2 of them at home, plus a chocolate lab.    ADVANCED DIRECTIVES: In place   HEALTH MAINTENANCE: Social History   Tobacco Use  . Smoking status: Never Smoker  . Smokeless tobacco: Never Used  Substance Use Topics  . Alcohol use: Yes    Alcohol/week: 0.0 standard drinks    Comment: 1-2 glasses of wine a month  . Drug use: No     Colonoscopy: In [place] under Dr. Lavina Hamman, date not entirely clear  PAP: February 2016  Bone density: Under [facility], results not currently available  Lipid panel:  Allergies  Allergen Reactions  . Clindamycin/Lincomycin Diarrhea    Resulted in C-diff  . Nsaids Other (See Comments)    History of bleeding ulcers   . Other Nausea Only    Stronger pain meds  . Adhesive [Tape] Rash    Pt is sensitive to any kind of adhesive tape products.    Current Outpatient Medications  Medication Sig Dispense Refill  . acetaminophen (TYLENOL) 325 MG tablet Take 650 mg by mouth every 6 (six) hours as needed (for pain.).    Marland Kitchen carboxymethylcellulose (REFRESH PLUS) 0.5 % SOLN Place 1 drop into both eyes 3 (three) times daily as needed (dry eyes.).    Marland Kitchen Cholecalciferol (VITAMIN D-3) 125 MCG (5000 UT) TABS Take 5,000 Units by mouth daily.    . ferrous sulfate 325 (65 FE) MG EC tablet Take 1 tablet (325 mg total) by mouth 2 (two) times daily. (Patient not taking: Reported  on 04/14/2019) 60 tablet 3  . fluticasone (FLONASE) 50 MCG/ACT nasal spray Place 1 spray into both nostrils daily.     Marland Kitchen lamoTRIgine (LAMICTAL) 200 MG tablet TAKE 1/2 TABLET(100 MG) BY MOUTH DAILY 45 tablet 2  . Lutein-Zeaxanthin 25-5 MG CAPS Take 1 capsule by mouth daily.    Marland Kitchen MAGNESIUM CITRATE PO Take 420 mg by mouth daily.    . ondansetron (ZOFRAN ODT) 4 MG disintegrating tablet Take 1 tablet (4 mg  total) by mouth every 8 (eight) hours as needed for nausea. (Patient not taking: Reported on 04/14/2019) 10 tablet 0  . pantoprazole (PROTONIX) 40 MG tablet Take 1 tablet (40 mg total) by mouth 2 (two) times daily. 60 tablet 2  . promethazine (PHENERGAN) 25 MG tablet Take 1 tablet (25 mg total) by mouth every 6 (six) hours as needed for nausea or vomiting. (Patient not taking: Reported on 04/14/2019) 30 tablet 0  . Zoledronic Acid (ZOMETA IV) Inject 1 Dose into the vein every 6 (six) months.      No current facility-administered medications for this visit.    OBJECTIVE: white woman   There were no vitals filed for this visit.   There is no height or weight on file to calculate BMI.          LAB RESULTS:  CMP     Component Value Date/Time   NA 142 11/02/2019 1205   NA 140 09/12/2017 1045   K 4.0 11/02/2019 1205   K 4.3 09/12/2017 1045   CL 105 11/02/2019 1205   CO2 26 11/02/2019 1205   CO2 24 09/12/2017 1045   GLUCOSE 82 11/02/2019 1205   GLUCOSE 85 09/12/2017 1045   BUN 17 11/02/2019 1205   BUN 16.3 09/12/2017 1045   CREATININE 0.84 11/02/2019 1205   CREATININE 0.8 09/12/2017 1045   CALCIUM 8.8 (L) 11/02/2019 1205   CALCIUM 9.5 09/12/2017 1045   PROT 7.0 11/02/2019 1205   PROT 7.6 09/12/2017 1045   ALBUMIN 4.6 11/02/2019 1205   ALBUMIN 4.4 09/12/2017 1045   AST 25 11/02/2019 1205   AST 21 09/12/2017 1045   ALT 24 11/02/2019 1205   ALT 17 09/12/2017 1045   ALKPHOS 51 11/02/2019 1205   ALKPHOS 67 09/12/2017 1045   BILITOT 0.4 11/02/2019 1205   BILITOT 0.26 09/12/2017 1045     GFRNONAA >60 11/02/2019 1205   GFRAA >60 11/02/2019 1205    INo results found for: SPEP, UPEP  Lab Results  Component Value Date   WBC 5.7 11/02/2019   NEUTROABS 4.5 11/02/2019   HGB 13.2 11/02/2019   HCT 39.6 11/02/2019   MCV 98.0 11/02/2019   PLT 193 11/02/2019      Chemistry      Component Value Date/Time   NA 142 11/02/2019 1205   NA 140 09/12/2017 1045   K 4.0 11/02/2019 1205   K 4.3 09/12/2017 1045   CL 105 11/02/2019 1205   CO2 26 11/02/2019 1205   CO2 24 09/12/2017 1045   BUN 17 11/02/2019 1205   BUN 16.3 09/12/2017 1045   CREATININE 0.84 11/02/2019 1205   CREATININE 0.8 09/12/2017 1045      Component Value Date/Time   CALCIUM 8.8 (L) 11/02/2019 1205   CALCIUM 9.5 09/12/2017 1045   ALKPHOS 51 11/02/2019 1205   ALKPHOS 67 09/12/2017 1045   AST 25 11/02/2019 1205   AST 21 09/12/2017 1045   ALT 24 11/02/2019 1205   ALT 17 09/12/2017 1045   BILITOT 0.4 11/02/2019 1205   BILITOT 0.26 09/12/2017 1045       No results found for: LABCA2  No components found for: LABCA125  No results for input(s): INR in the last 168 hours.  Urinalysis    Component Value Date/Time   COLORURINE YELLOW 06/29/2018 1933   APPEARANCEUR CLEAR 06/29/2018 1933   LABSPEC 1.000 04/14/2019 1007   PHURINE 7.0 06/29/2018 1933   GLUCOSEU NEGATIVE 06/29/2018 1933   GLUCOSEU Negative 04/03/2015 1451   HGBUR  NEGATIVE 06/29/2018 1933   BILIRUBINUR negative 04/14/2019 1007   BILIRUBINUR neg 04/10/2017 0858   BILIRUBINUR Negative 04/03/2015 1451   KETONESUR trace (5) (A) 04/14/2019 1007   KETONESUR 5 (A) 06/29/2018 1933   PROTEINUR negative 04/14/2019 1007   PROTEINUR NEGATIVE 06/29/2018 1933   UROBILINOGEN negative (A) 04/10/2017 0858   UROBILINOGEN 0.2 04/03/2015 1451   NITRITE Negative 04/14/2019 1007   NITRITE NEGATIVE 06/29/2018 1933   LEUKOCYTESUR Trace (A) 04/14/2019 1007   LEUKOCYTESUR Small 04/03/2015 1451    STUDIES: No results found.   ASSESSMENT: 64 y.o.  [place] woman s/p left breast upper outer quadrant biopsy 12/19/2014 for a clinical T2 N0, stage IIA invasive ductal carcinoma, grade 2 or 3, estrogen and progesterone receptor negative, with an MIB-1 of 38%, HER-2 equivocal initially but positive on further testing with a ratio of 3.4  (1) neoadjuvant chemotherapy consisted of carboplatin, docetaxel, trastuzumab and pertuzumab given every 3 weeks for 6 cycles with Neulasta support--started 01/16/2015, completed 05/01/2015  (a) docetaxel switched for gemcitabine for final 2 cycles because of persistent neuropathy.  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  Left breast boost/ 16 Gy at 2 Gy per fraction x 8 fractions  (4) genetics counselor felt the risk of a familial mutation was sufficiently low testing could be omitted  (5) continued trastuzumab to complete a year, last dose 01/08/2016  (a) final echocardiogram 10/11/2015 shows an ejection fraction in the 65%-70% range  (6) left eye visual disturbance with negative brain MRI with and without contrast 10/12/2015  (7) bone density at [facility] 06/20/2016 osteoporosis with a T score of -2.5.   (a) zoledronate every 6 months started November 0258  (8) follicular non-Hodgkin's lymphoma.  (a) duodenal biopsy 52/77/8242 showed follicular lymphoma grade 1 or 2, CD20 and CD10 positive, CD5 negative and lambda restricted with an MIB-1 of less than 10%  (b) bone marrow biopsy 11/11/2018 showed 2 very minute lymphoid aggregates with negative flow cytometry  (c) PET/CT shows scattered subcentimeter mesenteric lymph nodes.  (d) radiation therapy at [facility]: Duodenum, 3D- 2520cGy 18 180cGy 12/14/18-12/31/18   PLAN: Rhonda Gardner did not show for her visit February 08, 2020.  Letter sent.   Aurea Graff    02/07/2020 4:21 PM  Medical Oncology and Hematology [facility] [place], [place] [address] Tel. [phone]    Fax. [phone]   I, Wilburn Mylar, am acting as scribe for Dr. Virgie Dad. Khale Nigh.  I, Lurline Del MD, have reviewed the above documentation for accuracy and completeness, and I agree with the above.   *Total Encounter Time as defined by the Centers for Medicare and Medicaid Services includes, in addition to the face-to-face time of a patient visit (documented in the note above) non-face-to-face time: obtaining and reviewing outside history, ordering and reviewing medications, tests or procedures, care coordination (communications with other health care professionals or caregivers) and documentation in the medical record.

## 2020-02-08 ENCOUNTER — Encounter: Payer: Self-pay | Admitting: Oncology

## 2020-02-08 ENCOUNTER — Inpatient Hospital Stay: Payer: BLUE CROSS/BLUE SHIELD | Attending: Family Medicine

## 2020-02-08 ENCOUNTER — Inpatient Hospital Stay (HOSPITAL_BASED_OUTPATIENT_CLINIC_OR_DEPARTMENT_OTHER): Payer: BLUE CROSS/BLUE SHIELD | Admitting: Oncology

## 2020-02-08 DIAGNOSIS — C50412 Malignant neoplasm of upper-outer quadrant of left female breast: Secondary | ICD-10-CM

## 2020-02-08 DIAGNOSIS — Z171 Estrogen receptor negative status [ER-]: Secondary | ICD-10-CM

## 2020-02-08 DIAGNOSIS — C8298 Follicular lymphoma, unspecified, lymph nodes of multiple sites: Secondary | ICD-10-CM

## 2020-02-11 ENCOUNTER — Encounter: Payer: Self-pay | Admitting: Family Medicine

## 2020-02-16 ENCOUNTER — Encounter: Payer: Self-pay | Admitting: Physical Medicine & Rehabilitation

## 2020-03-02 ENCOUNTER — Encounter: Payer: Medicare Other | Attending: Physical Medicine & Rehabilitation | Admitting: Physical Medicine & Rehabilitation

## 2020-03-02 ENCOUNTER — Other Ambulatory Visit: Payer: Self-pay

## 2020-03-02 ENCOUNTER — Encounter: Payer: Self-pay | Admitting: Physical Medicine & Rehabilitation

## 2020-03-02 VITALS — BP 96/64 | HR 76 | Temp 97.5°F | Ht 67.0 in | Wt 129.8 lb

## 2020-03-02 DIAGNOSIS — R42 Dizziness and giddiness: Secondary | ICD-10-CM

## 2020-03-02 DIAGNOSIS — R269 Unspecified abnormalities of gait and mobility: Secondary | ICD-10-CM

## 2020-03-02 DIAGNOSIS — R4189 Other symptoms and signs involving cognitive functions and awareness: Secondary | ICD-10-CM

## 2020-03-02 DIAGNOSIS — T451X5A Adverse effect of antineoplastic and immunosuppressive drugs, initial encounter: Secondary | ICD-10-CM

## 2020-03-02 DIAGNOSIS — F0781 Postconcussional syndrome: Secondary | ICD-10-CM

## 2020-03-02 DIAGNOSIS — F319 Bipolar disorder, unspecified: Secondary | ICD-10-CM | POA: Diagnosis present

## 2020-03-02 DIAGNOSIS — G620 Drug-induced polyneuropathy: Secondary | ICD-10-CM

## 2020-03-02 DIAGNOSIS — C8298 Follicular lymphoma, unspecified, lymph nodes of multiple sites: Secondary | ICD-10-CM

## 2020-03-02 DIAGNOSIS — G479 Sleep disorder, unspecified: Secondary | ICD-10-CM | POA: Diagnosis present

## 2020-03-02 NOTE — Progress Notes (Addendum)
Subjective:    Patient ID: Rhonda Gardner, female    DOB: Mar 19, 1956, 64 y.o.   MRN: [id number]  HPI Female with pmh/psh of foot surgery of lymphoma s/p chemo/radiation, breast CA, depression, bipolar disorder, fall presents with post concussive syndrome. Husband supplements history. Main complaints, dizziness, balance deficits, and aphasia.  Started in 03/03/19 after a fall.  She had head CT showing left frontal skull fracture into the left frontal sinus with posterior scalp hematoma.  Symptoms have been stable. Lack of sleep exacerbates symptoms.  No alleviating factors identified.  She denies time.  Symptoms seem to be worse in evenings/night. Associated changes in taste and smell have had some improvement. She tried sniff sticks with ?benefit. She is on Lamictal for ~20 years. She does note balance and visual deficits started after Chemo.  Husband also notes alcohol use with consumption of a couple of drinks a day, however patient states she drinks a couple of drinks a week.  Denies falls since trauma. Issues limit ADLs.   Pain Inventory Average Pain 0 Pain Right Now 0 My pain is na  In the last 24 hours, has pain interfered with the following? General activity 0 Relation with others 0 Enjoyment of life 0 What TIME of day is your pain at its worst? na Sleep (in general) Good  Pain is worse with: na Pain improves with: na Relief from Meds: na  Mobility how many minutes can you walk? indefintely ability to climb steps?  yes do you drive?  yes  Function retired  Neuro/Psych trouble walking loss of taste or smell  Prior Studies Any changes since last visit?  yes bone scan  Physicians involved in your care Any changes since last visit?  yes Neurosurgeon Dr. Saintclair Halsted   Family History  Problem Relation Age of Onset  . Heart disease Father   . Skin cancer Father        non melanoma - farmer  . Arthritis Mother        osteoarthritis  . Macular degeneration Mother          wet and dry  . Colon cancer Mother 82       colon cancer at age 83  . Hypertension Mother        resolved with weight loss  . Osteoporosis Sister        osteopenia (states it resolved)  . Lung cancer Maternal Aunt        lung cancer  . Cancer Paternal Aunt 81       ? stomach cancer  . Mental retardation Sister   . Blindness Sister        secondary to retrolental fibroplasia  . Osteoporosis Sister   . Goiter Paternal Aunt   . Diabetes Paternal Aunt   . Esophageal cancer Neg Hx   . Rectal cancer Neg Hx   . Stomach cancer Neg Hx    Social History   Socioeconomic History  . Marital status: Married    Spouse name: Not on file  . Number of children: Not on file  . Years of education: Not on file  . Highest education level: Not on file  Occupational History  . Not on file  Tobacco Use  . Smoking status: Never Smoker  . Smokeless tobacco: Never Used  Substance and Sexual Activity  . Alcohol use: Yes    Alcohol/week: 0.0 standard drinks    Comment: 1-2 glasses of wine a month  . Drug use: No  .  Sexual activity: Yes    Partners: Male    Comment: postmenopausal  Other Topics Concern  . Not on file  Social History Narrative   Married [name]), 1 dog (chocolate lab). Homemaker   Rare wine   No drugs, tobacco   Social Determinants of Health   Financial Resource Strain:   . Difficulty of Paying Living Expenses:   Food Insecurity:   . Worried About Charity fundraiser in the Last Year:   . Arboriculturist in the Last Year:   Transportation Needs:   . Film/video editor (Medical):   Marland Kitchen Lack of Transportation (Non-Medical):   Physical Activity:   . Days of Exercise per Week:   . Minutes of Exercise per Session:   Stress:   . Feeling of Stress :   Social Connections:   . Frequency of Communication with Friends and Family:   . Frequency of Social Gatherings with Friends and Family:   . Attends Religious Services:   . Active Member of Clubs or Organizations:   .  Attends Archivist Meetings:   Marland Kitchen Marital Status:    Past Surgical History:  Procedure Laterality Date  . BIOPSY  07/03/2018   Procedure: BIOPSY;  Surgeon: Gatha Mayer, MD;  Location: [facility];  Service: Endoscopy;;  . BIOPSY  09/02/2018   Procedure: BIOPSY;  Surgeon: Gatha Mayer, MD;  Location: [facility];  Service: Endoscopy;;  . BIOPSY  10/22/2018   Procedure: BIOPSY;  Surgeon: Milus Banister, MD;  Location: [facility];  Service: Endoscopy;;  . BREAST BIOPSY Bilateral    benign and a right stereotatic breast biopsy.  . COLONOSCOPY    . ESOPHAGOGASTRODUODENOSCOPY N/A 07/16/2017   Procedure: ESOPHAGOGASTRODUODENOSCOPY (EGD);  Surgeon: Ladene Artist, MD;  Location[facility];  Service: Endoscopy;  Laterality: N/A;  . ESOPHAGOGASTRODUODENOSCOPY N/A 10/22/2018   Procedure: ESOPHAGOGASTRODUODENOSCOPY (EGD);  Surgeon: Milus Banister, MD;  Location: [facility];  Service: Endoscopy;  Laterality: N/A;  . ESOPHAGOGASTRODUODENOSCOPY (EGD) WITH PROPOFOL N/A 07/03/2018   Procedure: ESOPHAGOGASTRODUODENOSCOPY (EGD) WITH PROPOFOL;  Surgeon: Gatha Mayer, MD;  Location: [facility];  Service: Endoscopy;  Laterality: N/A;  . ESOPHAGOGASTRODUODENOSCOPY (EGD) WITH PROPOFOL N/A 09/02/2018   Procedure: ESOPHAGOGASTRODUODENOSCOPY (EGD) WITH PROPOFOL;  Surgeon: Gatha Mayer, MD;  Location: [facility];  Service: Endoscopy;  Laterality: N/A;  . EUS N/A 10/22/2018   Procedure: UPPER ENDOSCOPIC ULTRASOUND (EUS) RADIAL;  Surgeon: Milus Banister, MD;  Location: [facility];  Service: Endoscopy;  Laterality: N/A;  . fibroid excision  age 77   prolapsed fibroid removed  . FINE NEEDLE ASPIRATION  10/22/2018   Procedure: FINE NEEDLE ASPIRATION;  Surgeon: Milus Banister, MD;  Location: [facility];  Service: Endoscopy;;  . FOOT SURGERY Left 2014   Dr. Paulla Dolly  . FOOT SURGERY Right 08/2016   Dr. Paulla Dolly --Shortening Osteotomy with Fixation 1st Metatarsal   . MOUTH SURGERY  06/2017    [facility]   . PORT-A-CATH REMOVAL N/A 02/15/2016   Procedure: REMOVAL PORT-A-CATH;  Surgeon: Autumn Messing III, MD;  Location: [facility];  Service: General;  Laterality: N/A;  . RADIOACTIVE SEED GUIDED PARTIAL MASTECTOMY WITH AXILLARY SENTINEL LYMPH NODE BIOPSY Left 05/25/2015   Procedure: LEFT BREAST LUMPECTOMY WITH RADIOACTIVE SEED AND SENTINEL LYMPH NODE BIOPSY;  Surgeon: Autumn Messing III, MD;  Location: [facility];  Service: General;  Laterality: Left;  . UPPER GASTROINTESTINAL ENDOSCOPY     Past Medical History:  Diagnosis Date  .  Allergy 2006   chemical cauterization in spring 2006, and 2nd treatment with good results.(Dr.Shoemaker)  . Bipolar disorder ([place])   . Bleeding ulcer   . Blood transfusion without reported diagnosis 07/2017   Bleeding ulcer  . Breast cancer ([place]) 12/2014   invasive ductal carcinoma (LEFT)  . Breast cancer of upper-outer quadrant of left female breast ([place]) 12/22/2014  . Breast hematoma 12/19/2015   had left breast aspiration of 4cm hematoma-path indicates benign hematoma  . C. difficile colitis   . C. difficile diarrhea   . Depression   . 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 Osteoporosis 06/20/2016   T-2.5@spine  on Dexa ([facility])  . Plantar fasciitis   . PONV (postoperative nausea and vomiting)   . Symptomatic anemia 07/16/2017   BP 96/64   Pulse 76   Temp (!) 97.5 F (36.4 C)   Ht 5\' 7"  (1.702 m)   Wt 129 lb 12.8 oz (58.9 kg)   SpO2 98%   BMI 20.33 kg/m   Opioid Risk Score:   Fall Risk Score:  `1  Depression screen PHQ 2/9  Depression screen [facility] 2/9 04/14/2019 04/13/2018 04/10/2017 04/08/2016 07/04/2015  Decreased Interest 0 0 0 0 0  Down, Depressed, Hopeless 0 0 0 0 0  PHQ - 2 Score 0 0 0 0 0  Some recent data might be hidden    Review of Systems  Constitutional: Positive for unexpected weight change.  HENT:       Changes in taste and smell  Musculoskeletal: Positive for gait  problem.  Neurological: Positive for dizziness and speech difficulty. Negative for weakness, numbness and headaches.  Psychiatric/Behavioral:       Deficits in processing  All other systems reviewed and are negative.     Objective:   Physical Exam Constitutional: No distress . Vital signs reviewed. HENT: Normocephalic.  Atraumatic. Eyes: EOMI. No discharge. Cardiovascular: No JVD. Respiratory: Normal effort.  No stridor. GI: Non-distended. Skin: Warm and dry.  Intact. Psych: Normal mood.  Normal behavior. Musc: No edema in extremities.  No tenderness in extremities. Gait: WNL including toe/heel/tandem Neuro: Alert and oriented Motor: B/l UE 5/5 Sensation intact to light b/l UE Attention/concentration intact Recall 5/5 after 5 minutes    Assessment & Plan:  Female with pmh/psh of foot surgery of lymphoma s/p chemo/radiation, breast CA, depression, bipolar disorder, fall presents with post concussive syndrome.   1.  Cognitive deficits   Multifactorial post concussive syndrome +/- sleep disturbance +/- lamictal +/- chemo induced peripheral neuroapthy +/- Etoh use +/- age related changes  Main deficits of dizziness, balance deficits, aphasia, visual deficits.    Started in 03/03/19 after a fall  CT head posterior scalp hematoma and frontal skull and sinus fracture  Labs reviewed  Referral information reviewed - post concussive sydome  Will refer to PT  Patient states main goal is improve symptoms  Attempted to order Vit B12, D, however blocked, encourage patient to follow-up with PCP  Will consider Neurooptometrist  Will consider change in lamictal   2. Gait abnormality  Will order PT  Will consider cane if necessary  3. Sleep disturbance  Will consider Melatonin 1.5mg  (groggy with higher doses)  Encouraged follow up with PCP for sleep study  Encouraged sleep hygiene - bedtime routines, decreasing fluid intake  4.  Alcohol use  Will encourage decreasing alcohol  consumption  >60 minutes spent in total reviewing records from ED, and neurosurgeon as well as counseling  patient regarding multifactorial source of deficits as well as potential treatment options, obtaining history from husband and patient.

## 2020-03-27 ENCOUNTER — Other Ambulatory Visit: Payer: Self-pay

## 2020-03-27 ENCOUNTER — Ambulatory Visit: Payer: Medicare Other | Attending: Physical Medicine & Rehabilitation

## 2020-03-27 DIAGNOSIS — R42 Dizziness and giddiness: Secondary | ICD-10-CM | POA: Insufficient documentation

## 2020-03-27 DIAGNOSIS — R208 Other disturbances of skin sensation: Secondary | ICD-10-CM

## 2020-03-27 DIAGNOSIS — R2689 Other abnormalities of gait and mobility: Secondary | ICD-10-CM

## 2020-03-27 DIAGNOSIS — R209 Unspecified disturbances of skin sensation: Secondary | ICD-10-CM | POA: Diagnosis present

## 2020-03-27 NOTE — Patient Instructions (Signed)
Gaze Stabilization: Tip Card  1.Target must remain in focus, not blurry, and appear stationary while head is in motion. 2.Perform exercises with small head movements (45 to either side of midline). 3.Increase speed of head motion so long as target is in focus. 4.If you wear eyeglasses, be sure you can see target through lens (therapist will give specific instructions for bifocal / progressive lenses). 5.These exercises may provoke dizziness or nausea. Work through these symptoms. If too dizzy, slow head movement slightly. Rest between each exercise. 6.Exercises demand concentration; avoid distractions. 7.For safety, perform standing exercises close to a counter, wall, corner, or next to someone.  Copyright  VHI. All rights reserved.   Gaze Stabilization: Sitting    Keeping eyes on target on wall 5 feet away, tilt head down 15-30 and move head side to side for _30___ seconds. Repeat while moving head up and down for __30__ seconds. Do __2-3__ sessions per day. Copyright  VHI. All rights reserved.

## 2020-03-27 NOTE — Therapy (Signed)
[facility] [address] [place], [place], [address] Phone: [phone]   Fax:  [phone]  Physical Therapy Evaluation  Patient Details  Name: Rhonda Gardner MRN: [id number] Date of Birth: 1956/05/12 Referring Provider (PT): Delice Lesch, MD   Encounter Date: 03/27/2020   PT End of Session - 03/27/20 1815    Visit Number 1    Number of Visits 17    Date for PT Re-Evaluation 06/25/20    PT Start Time 4650    PT Stop Time 3546    PT Time Calculation (min) 58 min    Equipment Utilized During Treatment Other (comment)   S prn   Activity Tolerance Patient tolerated treatment well    Behavior During Therapy [facility] for tasks assessed/performed           Past Medical History:  Diagnosis Date   Allergy 2006   chemical cauterization in spring 2006, and 2nd treatment with good results.(Dr.Shoemaker)   Bipolar disorder ([place])    Bleeding ulcer    Blood transfusion without reported diagnosis 07/2017   Bleeding ulcer   Breast cancer ([place]) 12/2014   invasive ductal carcinoma (LEFT)   Breast cancer of upper-outer quadrant of left female breast ([place]) 12/22/2014   Breast hematoma 12/19/2015   had left breast aspiration of 4cm hematoma-path indicates benign hematoma   C. difficile colitis    C. 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, treated at [facility] (radiation)   Osteoporosis 06/20/2016   T-2.5@spine  on Dexa ([place])   Plantar fasciitis    PONV (postoperative nausea and vomiting)    Symptomatic anemia 07/16/2017    Past Surgical History:  Procedure Laterality Date   BIOPSY  07/03/2018   Procedure: BIOPSY;  Surgeon: Gatha Mayer, MD;  Location: [facility];  Service: Endoscopy;;   BIOPSY  09/02/2018   Procedure: BIOPSY;  Surgeon: Gatha Mayer, MD;  Location: [facility];  Service: Endoscopy;;   BIOPSY  10/22/2018   Procedure: BIOPSY;  Surgeon:  Milus Banister, MD;  Location: [facility];  Service: Endoscopy;;   BREAST BIOPSY Bilateral    benign and a right stereotatic breast biopsy.   COLONOSCOPY     ESOPHAGOGASTRODUODENOSCOPY N/A 07/16/2017   Procedure: ESOPHAGOGASTRODUODENOSCOPY (EGD);  Surgeon: Ladene Artist, MD;  Location: [facility] END[facility]COPY;  Service: Endoscopy;  Laterality: N/A;   ESOPHAGOGASTRODUODENOSCOPY N/A 10/22/2018   Procedure: ESOPHAGOGASTRODUODENOSCOPY (EGD);  Surgeon: Milus Banister, MD;  Location: [facility];  Service: Endoscopy;  Laterality: N/A;   ESOPHAGOGASTRODUODENOSCOPY (EGD) WITH PROPOFOL N/A 07/03/2018   Procedure: ESOPHAGOGASTRODUODENOSCOPY (EGD) WITH PROPOFOL;  Surgeon: Gatha Mayer, MD;  Location: [facility];  Service: Endoscopy;  Laterality: N/A;   ESOPHAGOGASTRODUODENOSCOPY (EGD) WITH PROPOFOL N/A 09/02/2018   Procedure: ESOPHAGOGASTRODUODENOSCOPY (EGD) WITH PROPOFOL;  Surgeon: Gatha Mayer, MD;  Location: [facility];  Service: Endoscopy;  Laterality: N/A;   EUS N/A 10/22/2018   Procedure: UPPER ENDOSCOPIC ULTRASOUND (EUS) RADIAL;  Surgeon: Milus Banister, MD;  Location: [facility];  Service: Endoscopy;  Laterality: N/A;   fibroid excision  age 61   prolapsed fibroid removed   FINE NEEDLE ASPIRATION  10/22/2018   Procedure: FINE NEEDLE ASPIRATION;  Surgeon: Milus Banister, MD;  Location: [facility];  Service: Endoscopy;;   FOOT SURGERY Left 2014   Dr. Paulla Dolly   FOOT SURGERY Right 08/2016   Dr. Paulla Dolly --Shortening Osteotomy with Fixation 1st Metatarsal    MOUTH SURGERY  06/2017   [facility]    PORT-A-CATH REMOVAL N/A 02/15/2016   Procedure: REMOVAL PORT-A-CATH;  Surgeon: Autumn Messing III, MD;  Location: [facility];  Service: General;  Laterality: N/A;   RADIOACTIVE SEED GUIDED PARTIAL MASTECTOMY WITH AXILLARY SENTINEL LYMPH NODE BIOPSY Left 05/25/2015   Procedure: LEFT BREAST LUMPECTOMY WITH RADIOACTIVE SEED AND SENTINEL LYMPH NODE BIOPSY;  Surgeon: Autumn Messing III, MD;   Location: [facility];  Service: General;  Laterality: Left;   UPPER GASTROINTESTINAL ENDOSCOPY      There were no vitals filed for this visit.    Subjective Assessment - 03/27/20 1724    Subjective Pt fell on 03/03/19 which resulted in L frontal skull fx with L frontal sinus and posterior scalp hematoma. Pt's sense of smell and taste is slowly returning. Pt states her balance and vision is worse since fall (double vision with reading for prolonged periods of time that started after chemo). Feels like she has a "head full of sawdust" all the time. Pt has fallen 3-4 times in the last six months due to impaired balance. Pt had one episode of very impaired balance after walking with a neighbor and performing yardwork. Walking fast and more activity incr. "full feeling in head". Pt denied HA, tinnitus, sudden weakness or severe dizziness. Pt describes dizziness as more wooziness/lightheadedness. Pt reported 1/10 wooziness but balance is very impaired.    Pertinent History Hx of L breast CA (**no BP in LUE) s/p chemo/radiation, hx of lymphoma, foot surgery (bunions), depression, bipolar disorder, osteoporosis, plantar fasciitis, anemia    Patient Stated Goals For my head to clear up and for my balance to get where it was before.    Currently in Pain? No/denies              [facility] PT Assessment - 03/27/20 1732      Assessment   Medical Diagnosis Postconcussion syndrome    Referring Provider (PT) Delice Lesch, MD    Onset Date/Surgical Date 03/03/19    Hand Dominance Right    Next MD Visit 04/04/20    Prior Therapy none      Precautions   Precautions Fall    Precaution Comments osteoporosis      Restrictions   Weight Bearing Restrictions No      Balance Screen   Has the patient fallen in the past 6 months Yes    How many times? 3-4    Has the patient had a decrease in activity level because of a fear of falling?  No    Is the patient reluctant to leave their home because of  a fear of falling?  No      Home Ecologist residence    Living Arrangements Spouse/significant other    Available Help at Discharge Family    Type of [place] to enter    Entrance Stairs-Number of Steps 4-5    Entrance Stairs-Rails None   none at front entrance   [place] Two level    Alternate Level Stairs-Number of Steps 8-10 and then a landing plus 8-10 more steps    Alternate Level Stairs-Rails Right;Left   but able to hold wall on other side   Home Equipment None      Prior Function   Level of Independence Independent    Vocation Retired    Corporate investment banker, get together with friends, go for walks with friends, yardwork  Cognition   Overall Cognitive Status Impaired/Different from baseline    Area of Impairment Memory    Memory Decreased short-term memory    Memory Comments Is not sure if this is from concussion or chemo/radiation treatment.      Sensation   Light Touch Impaired by gross assessment   Decr. light touch in B LE s/p chemo   Additional Comments Pt states she believes she has Carpal Tunnel Syndrome but will get that taken care of after concussion is resolved.       Ambulation/Gait   Ambulation/Gait Yes    Ambulation/Gait Assistance 7: Independent;5: Supervision    Ambulation/Gait Assistance Details S for safety    Ambulation Distance (Feet) 100 Feet    Assistive device None    Gait Pattern Step-through pattern;Decreased trunk rotation    Ambulation Surface Indoor;Level    Gait velocity 4.9ft/sec. no AD                  Vestibular Assessment - 03/27/20 1745      Symptom Behavior   Subjective history of current problem see subjective assessment    Type of Dizziness  Lightheadedness;"Funny feeling in head"    Frequency of Dizziness Constant    Duration of Dizziness Constant    Symptom Nature Constant    Aggravating Factors Turning head quickly;Looking up to the ceiling;Comment   eyes  closed   Relieving Factors Slow movements    Progression of Symptoms No change since onset      Oculomotor Exam   Oculomotor Alignment Normal    Spontaneous Absent    Gaze-induced  Absent    Smooth Pursuits Intact   with pt reporting mild dizziness during R and L saccades.    Saccades Intact    Comment Convergence: blurry vision reported 2 feet from nose with double vision reported at 12 inches from nose.       Oculomotor Exam-Fixation Suppressed    Left Head Impulse (+)1-2 beats of corrective saccade with pt reporting blurred vision    Right Head Impulse negative      Vestibulo-Ocular Reflex   VOR 1 Head Only (x 1 viewing) Pt reported dizziness during testing: 4/10.     VOR Cancellation Normal      Positional Testing   Dix-Hallpike Dix-Hallpike Right;Dix-Hallpike Left    Horizontal Canal Testing Horizontal Canal Right;Horizontal Canal Left      Dix-Hallpike Right   Dix-Hallpike Right Duration 0    Dix-Hallpike Right Symptoms No nystagmus      Dix-Hallpike Left   Dix-Hallpike Left Duration 0    Dix-Hallpike Left Symptoms No nystagmus      Horizontal Canal Right   Horizontal Canal Right Duration 0    Horizontal Canal Right Symptoms Normal      Horizontal Canal Left   Horizontal Canal Left Duration 0    Horizontal Canal Left Symptoms Normal      Positional Sensitivities   Up from Right Hallpike Lightheadedness    Up from Left Hallpike Lightheadedness              Objective measurements completed on examination: See above findings.        Vestibular Treatment/Exercise - 03/27/20 1759      Vestibular Treatment/Exercise   Vestibular Treatment Provided Gaze    Gaze Exercises X1 Viewing Horizontal;X1 Viewing Vertical      X1 Viewing Horizontal   Foot Position seated    Time --   30 sec.   Reps 2  Comments Cues and demo for proper technique. Please see HEP for details.       X1 Viewing Vertical   Foot Position seated     Time --   30 sec.    Reps 2      Comments Cues and demo for proper technique. Please see HEP for details.                  PT Education - 03/27/20 1814    Education Details PT educated pt on outcome measures, PT POC, frequency and duration. PT provided pt with x1 viewing HEP.    Person(s) Educated Patient    Methods Explanation;Demonstration;Verbal cues;Handout    Comprehension Returned demonstration;Verbalized understanding            PT Short Term Goals - 03/27/20 1825      PT SHORT TERM GOAL #1   Title Pt will be IND in initial HEP to improve dizziness and balance. TARGET DATE FOR ALL STGS:04/24/20    Time 4    Period Weeks    Status New      PT SHORT TERM GOAL #2   Title Perform SOT and FGA and write STG/LTG as indicated.    Time 4    Period Weeks    Status New      PT SHORT TERM GOAL #3   Title Pt will amb. 500' over even terrain, while performing head turns/nods, IND to improve functional mobility.    Time 4    Period Weeks    Status New             PT Long Term Goals - 03/27/20 1829      PT LONG TERM GOAL #1   Title Pt will report no falls in the last four weeks to improve safety during functional mobility. TARGET DATE FOR LTGS: 05/22/20    Time 8    Period Weeks    Status New      PT LONG TERM GOAL #2   Title Pt will amb. 1000' over paved and uneven terrain, while performing head turns/nods, IND to improve safety during functional mobility.    Time 8    Period Weeks    Status New      PT LONG TERM GOAL #3   Title Pt will be IND in progressed HEP to improve dizziness and balance.    Time 8    Period Weeks    Status New                  Plan - 03/27/20 1817    Clinical Impression Statement Pt is a pleasant 63y/o female presenting to [facility] neuro s/p fall on 03/03/19 resulting in post-concussion syndrome. Pt's PMH is significant for the following: Hx of L breast CA (**no BP in LUE) s/p chemo/radiation, hx of lymphoma, foot surgery (bunions), depression, bipolar disorder,  osteoporosis, plantar fasciitis, anemia. Positioning testing was negative. Pt experieced concordant dizziness during VOR and L head impulse testing, indicating vestibular hypofunction. Dizziness is likely multifactorial 2/2 pt also has vision and sensation impairments likely 2/2 chemo/radiation. Pt will f/u with Dr. Posey Pronto re: referral to neuroophthalmology. Pt's gait speed indicated pt is at low falls risk, however, pt has fallen 3-4 times in last six months and is fearful of falling. The following impairments were noted upon exam: dizziness, gait deviations, balance not formally assessed 2/2 time constraints but will perform FGA next session, decr. activity tolerance, impaired short-term memory, impaired sensation. Pt would  benefit from skilled PT to improve safety during functional mobility.    Personal Factors and Comorbidities Age;Comorbidity 3+;Past/Current Experience;Time since onset of injury/illness/exacerbation    Comorbidities Hx of L breast CA (**no BP in LUE) s/p chemo/radiation, hx of lymphoma, foot surgery (bunions), depression, bipolar disorder, osteoporosis, plantar fasciitis, anemia    Examination-Activity Limitations Bathing;Locomotion Level;Stairs;Transfers;Caring for Others   pt's sister will visit pt soon and she has to assist her sister to the bathroom and she's fearful of the steps   Examination-Participation Restrictions Cleaning;Yard Work;Interpersonal Relationship    Stability/Clinical Decision Making Evolving/Moderate complexity    Clinical Decision Making Moderate    Rehab Potential Good    PT Frequency 2x / week    PT Duration 8 weeks    PT Treatment/Interventions ADLs/Self Care Home Management;Aquatic Therapy;Biofeedback;Canalith Repostioning;DME Instruction;Balance training;Neuromuscular re-education;Manual techniques;Therapeutic exercise;Therapeutic activities;Functional mobility training;Patient/family education;Stair training;Gait training;Cognitive remediation;Vestibular     PT Next Visit Plan Perform FGA and SOT and write goals as indicated. Review x1 viewing and progress as tolerated. Provide pt with balance HEP. Pt's sister (mentally disabled) will be visiting/staying with pt soon and pt is fearful of falling while traversing stairs to assist her sister toilet at night.    PT Home Exercise Plan x1 viewing    Consulted and Agree with Plan of Care Patient           Patient will benefit from skilled therapeutic intervention in order to improve the following deficits and impairments:  Abnormal gait, Dizziness, Decreased activity tolerance, Decreased knowledge of use of DME, Decreased balance, Decreased cognition, Decreased mobility, Decreased strength, Impaired sensation, Impaired flexibility  Visit Diagnosis: Dizziness and giddiness - Plan: PT plan of care cert/re-cert  Other abnormalities of gait and mobility - Plan: PT plan of care cert/re-cert  Other disturbances of skin sensation - Plan: PT plan of care cert/re-cert     Problem List Patient Active Problem List   Diagnosis Date Noted   Post concussive syndrome 03/02/2020   Dizziness and giddiness 83/15/1761   Follicular lymphoma ([place]) 11/16/2018   Duodenum disorder    Upper gastrointestinal bleed 07/02/2018   Abdominal pain, epigastric 07/01/2018   Elevated lipase 07/01/2018   Elevated LFTs 07/01/2018   Iron deficiency anemia 08/17/2017   C. 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 [place], [place], [address] Phone: [phone]   Fax:  [phone]  Name: Maila Dukes Gardner MRN: [id number] Date of Birth: [birth year]  Geoffry Paradise, PT,DPT 03/27/20 6:34 PM Phone: [phone] Fax: [phone]

## 2020-03-30 ENCOUNTER — Encounter: Payer: Medicare Other | Admitting: Physical Medicine & Rehabilitation

## 2020-04-04 ENCOUNTER — Encounter: Payer: Medicare Other | Attending: Physical Medicine & Rehabilitation | Admitting: Physical Medicine & Rehabilitation

## 2020-04-04 ENCOUNTER — Other Ambulatory Visit: Payer: Self-pay

## 2020-04-04 ENCOUNTER — Encounter: Payer: Self-pay | Admitting: Physical Medicine & Rehabilitation

## 2020-04-04 VITALS — BP 103/63 | HR 64 | Temp 97.3°F | Ht 67.0 in | Wt 128.2 lb

## 2020-04-04 DIAGNOSIS — T451X5A Adverse effect of antineoplastic and immunosuppressive drugs, initial encounter: Secondary | ICD-10-CM | POA: Diagnosis present

## 2020-04-04 DIAGNOSIS — F0781 Postconcussional syndrome: Secondary | ICD-10-CM | POA: Diagnosis present

## 2020-04-04 DIAGNOSIS — G479 Sleep disorder, unspecified: Secondary | ICD-10-CM | POA: Insufficient documentation

## 2020-04-04 DIAGNOSIS — R42 Dizziness and giddiness: Secondary | ICD-10-CM

## 2020-04-04 DIAGNOSIS — H539 Unspecified visual disturbance: Secondary | ICD-10-CM

## 2020-04-04 DIAGNOSIS — R4189 Other symptoms and signs involving cognitive functions and awareness: Secondary | ICD-10-CM | POA: Diagnosis present

## 2020-04-04 DIAGNOSIS — R269 Unspecified abnormalities of gait and mobility: Secondary | ICD-10-CM | POA: Insufficient documentation

## 2020-04-04 DIAGNOSIS — F319 Bipolar disorder, unspecified: Secondary | ICD-10-CM | POA: Diagnosis present

## 2020-04-04 DIAGNOSIS — C8298 Follicular lymphoma, unspecified, lymph nodes of multiple sites: Secondary | ICD-10-CM | POA: Diagnosis present

## 2020-04-04 DIAGNOSIS — G620 Drug-induced polyneuropathy: Secondary | ICD-10-CM | POA: Diagnosis present

## 2020-04-04 NOTE — Patient Instructions (Signed)
Lauretta Grill, O[employer]

## 2020-04-04 NOTE — Progress Notes (Signed)
Subjective:    Patient ID: Rhonda Gardner, female    DOB: 10-11-1956, 64 y.o.   MRN: [id number]  HPI Female with pmh/psh of foot surgery of lymphoma s/p chemo/radiation, breast CA, depression, bipolar disorder, fall presents with post concussive syndrome.  Initially stated: Husband supplements history. Main complaints, dizziness, balance deficits, and aphasia.  Started in 03/03/19 after a fall.  She had head CT showing left frontal skull fracture into the left frontal sinus with posterior scalp hematoma.  Symptoms have been stable. Lack of sleep exacerbates symptoms.  No alleviating factors identified.  She denies time.  Symptoms seem to be worse in evenings/night. Associated changes in taste and smell have had some improvement. She tried sniff sticks with ?benefit. She is on Lamictal for ~20 years. She does note balance and visual deficits started after Chemo.  Husband also notes alcohol use with consumption of a couple of drinks a day, however patient states she drinks a couple of drinks a week.  Denies falls since trauma. Issues limit ADLs.   Last clinic 03/02/20.  Husband supplements history. Since that time, pt states dizziness, balance deficits, vision is the same. She has only had 1 session of PT.  She has not followed up with PCP regarding Vitamin levels. Denies falls. Sleep is intermittent. She is not sure if she needs sleep study or if she is going to follow up with her PCP regarding such. She has decreased fluid intake prior to sleep. She has decreased her alcohol intake to ~1/day. Husband states cognition is the same vs slight improvement.   Pain Inventory Average Pain 0 Pain Right Now 0 My pain is No pain.  In the last 24 hours, has pain interfered with the following? General activity 0 Relation with others 0 Enjoyment of life 0 What TIME of day is your pain at its worst? No pain. Sleep (in general) Good  Pain is worse with: na Pain improves with: na Relief from Meds:  na  Mobility how many minutes can you walk? indefintely ability to climb steps?  yes do you drive?  yes  Function retired  Neuro/Psych trouble walking loss of taste or smell  Prior Studies Any changes since last visit?  no  Physicians involved in your care Any changes since last visit?  no   Family History  Problem Relation Age of Onset  . Heart disease Father   . Skin cancer Father        non melanoma - farmer  . Arthritis Mother        osteoarthritis  . Macular degeneration Mother        wet and dry  . Colon cancer Mother 35       colon cancer at age 61  . Hypertension Mother        resolved with weight loss  . Osteoporosis Sister        osteopenia (states it resolved)  . Lung cancer Maternal Aunt        lung cancer  . Cancer Paternal Aunt 40       ? stomach cancer  . Mental retardation Sister   . Blindness Sister        secondary to retrolental fibroplasia  . Osteoporosis Sister   . Goiter Paternal Aunt   . Diabetes Paternal Aunt   . Esophageal cancer Neg Hx   . Rectal cancer Neg Hx   . Stomach cancer Neg Hx    Social History   Socioeconomic History  .  Marital status: Married    Spouse name: Not on file  . Number of children: Not on file  . Years of education: Not on file  . Highest education level: Not on file  Occupational History  . Not on file  Tobacco Use  . Smoking status: Never Smoker  . Smokeless tobacco: Never Used  Vaping Use  . Vaping Use: Never used  Substance and Sexual Activity  . Alcohol use: Yes    Alcohol/week: 0.0 standard drinks    Comment: 1-2 glasses of wine a month  . Drug use: No  . Sexual activity: Yes    Partners: Male    Comment: postmenopausal  Other Topics Concern  . Not on file  Social History Narrative   Married [name]), 1 dog (chocolate lab). Homemaker   Rare wine   No drugs, tobacco   Social Determinants of Health   Financial Resource Strain:   . Difficulty of Paying Living Expenses:   Food  Insecurity:   . Worried About Charity fundraiser in the Last Year:   . Arboriculturist in the Last Year:   Transportation Needs:   . Film/video editor (Medical):   Marland Kitchen Lack of Transportation (Non-Medical):   Physical Activity:   . Days of Exercise per Week:   . Minutes of Exercise per Session:   Stress:   . Feeling of Stress :   Social Connections:   . Frequency of Communication with Friends and Family:   . Frequency of Social Gatherings with Friends and Family:   . Attends Religious Services:   . Active Member of Clubs or Organizations:   . Attends Archivist Meetings:   Marland Kitchen Marital Status:    Past Surgical History:  Procedure Laterality Date  . BIOPSY  07/03/2018   Procedure: BIOPSY;  Surgeon: Gatha Mayer, MD;  Location: [facility];  Service: Endoscopy;;  . BIOPSY  09/02/2018   Procedure: BIOPSY;  Surgeon: Gatha Mayer, MD;  Location: [facility];  Service: Endoscopy;;  . BIOPSY  10/22/2018   Procedure: BIOPSY;  Surgeon: Milus Banister, MD;  Location: [facility];  Service: Endoscopy;;  . BREAST BIOPSY Bilateral    benign and a right stereotatic breast biopsy.  . COLONOSCOPY    . ESOPHAGOGASTRODUODENOSCOPY N/A 07/16/2017   Procedure: ESOPHAGOGASTRODUODENOSCOPY (EGD);  Surgeon: Ladene Artist, MD;  Location: [facility];  Service: Endoscopy;  Laterality: N/A;  . ESOPHAGOGASTRODUODENOSCOPY N/A 10/22/2018   Procedure: ESOPHAGOGASTRODUODENOSCOPY (EGD);  Surgeon: Milus Banister, MD;  Location: [facility];  Service: Endoscopy;  Laterality: N/A;  . ESOPHAGOGASTRODUODENOSCOPY (EGD) WITH PROPOFOL N/A 07/03/2018   Procedure: ESOPHAGOGASTRODUODENOSCOPY (EGD) WITH PROPOFOL;  Surgeon: Gatha Mayer, MD;  Location: [facility];  Service: Endoscopy;  Laterality: N/A;  . ESOPHAGOGASTRODUODENOSCOPY (EGD) WITH PROPOFOL N/A 09/02/2018   Procedure: ESOPHAGOGASTRODUODENOSCOPY (EGD) WITH PROPOFOL;  Surgeon: Gatha Mayer, MD;  Location: [facility];  Service:  Endoscopy;  Laterality: N/A;  . EUS N/A 10/22/2018   Procedure: UPPER ENDOSCOPIC ULTRASOUND (EUS) RADIAL;  Surgeon: Milus Banister, MD;  Location: [facility];  Service: Endoscopy;  Laterality: N/A;  . fibroid excision  age 63   prolapsed fibroid removed  . FINE NEEDLE ASPIRATION  10/22/2018   Procedure: FINE NEEDLE ASPIRATION;  Surgeon: Milus Banister, MD;  Location: [facility];  Service: Endoscopy;;  . FOOT SURGERY Left 2014   Dr. Paulla Dolly  . FOOT SURGERY Right 08/2016   Dr. Paulla Dolly --Shortening Osteotomy with Fixation 1st Metatarsal   .  MOUTH SURGERY  06/2017   [facility]   . PORT-A-CATH REMOVAL N/A 02/15/2016   Procedure: REMOVAL PORT-A-CATH;  Surgeon: Autumn Messing III, MD;  Location: [facility];  Service: General;  Laterality: N/A;  . RADIOACTIVE SEED GUIDED PARTIAL MASTECTOMY WITH AXILLARY SENTINEL LYMPH NODE BIOPSY Left 05/25/2015   Procedure: LEFT BREAST LUMPECTOMY WITH RADIOACTIVE SEED AND SENTINEL LYMPH NODE BIOPSY;  Surgeon: Autumn Messing III, MD;  Location: [facility];  Service: General;  Laterality: Left;  . UPPER GASTROINTESTINAL ENDOSCOPY     Past Medical History:  Diagnosis Date  . Allergy 2006   chemical cauterization in spring 2006, and 2nd treatment with good results.(Dr.Shoemaker)  . Bipolar disorder ([place])   . Bleeding ulcer   . Blood transfusion without reported diagnosis 07/2017   Bleeding ulcer  . Breast cancer ([place]) 12/2014   invasive ductal carcinoma (LEFT)  . Breast cancer of upper-outer quadrant of left female breast ([place]) 12/22/2014  . Breast hematoma 12/19/2015   had left breast aspiration of 4cm hematoma-path indicates benign hematoma  . C. difficile colitis   . C. difficile diarrhea   . Depression   . 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 Osteoporosis 06/20/2016   T-2.5@spine  on Dexa ([facility])  . Plantar fasciitis   . PONV (postoperative nausea and vomiting)   . Symptomatic anemia 07/16/2017    BP 103/63   Pulse 64   Temp (!) 97.3 F (36.3 C)   Ht 5\' 7"  (1.702 m)   Wt 128 lb 3.2 oz (58.2 kg)   SpO2 98%   BMI 20.08 kg/m   Opioid Risk Score:   Fall Risk Score:  `1  Depression screen PHQ 2/9  Depression screen [facility] 2/9 04/04/2020 04/14/2019 04/13/2018 04/10/2017 04/08/2016 07/04/2015  Decreased Interest 0 0 0 0 0 0  Down, Depressed, Hopeless 0 0 0 0 0 0  PHQ - 2 Score 0 0 0 0 0 0  Altered sleeping 0 - - - - -  Tired, decreased energy 0 - - - - -  Change in appetite 0 - - - - -  Feeling bad or failure about yourself  0 - - - - -  Trouble concentrating 0 - - - - -  Moving slowly or fidgety/restless 0 - - - - -  Suicidal thoughts 0 - - - - -  PHQ-9 Score 0 - - - - -  Some recent data might be hidden    Review of Systems  Constitutional: Positive for unexpected weight change.  HENT: Negative.        Changes in taste and smell  Eyes:       Double vision  Respiratory: Negative.   Cardiovascular: Negative.   Gastrointestinal: Negative.   Genitourinary: Negative.   Musculoskeletal: Positive for gait problem.  Skin: Negative.   Allergic/Immunologic: Negative.   Neurological: Positive for dizziness and speech difficulty. Negative for numbness.  Hematological: Negative.   Psychiatric/Behavioral: Negative.        Deficits in processing  All other systems reviewed and are negative.     Objective:   Physical Exam Constitutional: NAD. Vital signs reviewed. HENT: Normocephalic. Atraumatic. Eyes: EOMI. No discharge. Cardiovascular: No JVD. Respiratory: Normal effort.  No stridor. GI: Non-distended. Skin: Warm and dry.  Intact. Psych: Normal mood.  Normal behavior. Musc: No edema in extremities.  No tenderness in extremities. Gait: WNL. Neuro: Alert and oriented Motor: B/l UE 5/5     Assessment &  Plan:  Female with pmh/psh of foot surgery of lymphoma s/p chemo/radiation, breast CA, depression, bipolar disorder, fall presents with post concussive syndrome.   1.   Higher level cognitive deficits              Multifactorial post concussive syndrome +/- sleep disturbance +/- lamictal +/- chemo induced peripheral neuroapthy +/- Etoh use +/- age related changes             Main deficits of dizziness, balance deficits, aphasia, visual deficits  - stable               Started in 03/03/19 after a fall             CT head posterior scalp hematoma and frontal skull and sinus fracture             Follow up with PT - notes reviewed, multifactorial gait disturbance             Patient states main goal is improve symptoms             Patient to follow up with PCP regarding Vit B12, D             Referral to Neurooptometrist made             Will consider change in lamictal, if necessary  Discussed prognosis with husband and patient  2. Gait abnormality             Follow up with PT             Will consider cane if necessary  3. Sleep disturbance             Will consider Melatonin 1.5mg  (groggy with higher doses)             Encouraged follow up with PCP for sleep study, pt is going to consider             Encouraged sleep hygiene - she is decreasing fluid intake at night  4.  Alcohol use             Continue to decrease alcohol consumption  >30 minutes spent in total reviewed notes as well as discussing treatment options

## 2020-04-11 ENCOUNTER — Other Ambulatory Visit: Payer: Self-pay

## 2020-04-11 ENCOUNTER — Ambulatory Visit: Payer: Medicare Other

## 2020-04-11 DIAGNOSIS — R42 Dizziness and giddiness: Secondary | ICD-10-CM

## 2020-04-11 DIAGNOSIS — R2689 Other abnormalities of gait and mobility: Secondary | ICD-10-CM

## 2020-04-11 NOTE — Therapy (Signed)
[facility] [address] [place], [place], [address] Phone: [phone]   Fax:  [phone]  Physical Therapy Treatment  Patient Details  Name: Rhonda Gardner MRN: [id number] Date of Birth: 10/26/1955 Referring Provider (PT): Delice Lesch, MD   Encounter Date: 04/11/2020   PT End of Session - 04/11/20 1215    Visit Number 2    Number of Visits 17    Date for PT Re-Evaluation 06/25/20    Authorization Type Medicare: PN every 10th visit.    PT Start Time 1020   pt arrived late   PT Stop Time 1100    PT Time Calculation (min) 40 min    Equipment Utilized During Treatment Other (comment)   min guard to S prn   Activity Tolerance Patient tolerated treatment well    Behavior During Therapy WFL for tasks assessed/performed           Past Medical History:  Diagnosis Date  . Allergy 2006   chemical cauterization in spring 2006, and 2nd treatment with good results.(Dr.Shoemaker)  . Bipolar disorder ([place])   . Bleeding ulcer   . Blood transfusion without reported diagnosis 07/2017   Bleeding ulcer  . Breast cancer ([place]) 12/2014   invasive ductal carcinoma (LEFT)  . Breast cancer of upper-outer quadrant of left female breast ([place]) 12/22/2014  . Breast hematoma 12/19/2015   had left breast aspiration of 4cm hematoma-path indicates benign hematoma  . C. difficile colitis   . C. difficile diarrhea   . Depression   . 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 Osteoporosis 06/20/2016   T-2.5@spine  on Dexa ([facility])  . Plantar fasciitis   . PONV (postoperative nausea and vomiting)   . Symptomatic anemia 07/16/2017    Past Surgical History:  Procedure Laterality Date  . BIOPSY  07/03/2018   Procedure: BIOPSY;  Surgeon: Gatha Mayer, MD;  Location: [facility];  Service: Endoscopy;;  . BIOPSY  09/02/2018   Procedure: BIOPSY;  Surgeon: Gatha Mayer, MD;  Location: [facility]  ENDOSCOPY;  Service: Endoscopy;;  . BIOPSY  10/22/2018   Procedure: BIOPSY;  Surgeon: Milus Banister, MD;  Location: [facility] ENDOSCOPY;  Service: Endoscopy;;  . BREAST BIOPSY Bilateral    benign and a right stereotatic breast biopsy.  . COLONOSCOPY    . ESOPHAGOGASTRODUODENOSCOPY N/A 07/16/2017   Procedure: ESOPHAGOGASTRODUODENOSCOPY (EGD);  Surgeon: Ladene Artist, MD;  Location: [facility] ENDOSCOPY;  Service: Endoscopy;  Laterality: N/A;  . ESOPHAGOGASTRODUODENOSCOPY N/A 10/22/2018   Procedure: ESOPHAGOGASTRODUODENOSCOPY (EGD);  Surgeon: Milus Banister, MD;  Location: [facility];  Service: Endoscopy;  Laterality: N/A;  . ESOPHAGOGASTRODUODENOSCOPY (EGD) WITH PROPOFOL N/A 07/03/2018   Procedure: ESOPHAGOGASTRODUODENOSCOPY (EGD) WITH PROPOFOL;  Surgeon: Gatha Mayer, MD;  Location: [facility];  Service: Endoscopy;  Laterality: N/A;  . ESOPHAGOGASTRODUODENOSCOPY (EGD) WITH PROPOFOL N/A 09/02/2018   Procedure: ESOPHAGOGASTRODUODENOSCOPY (EGD) WITH PROPOFOL;  Surgeon: Gatha Mayer, MD;  Location: [facility] ENDOSCOPY;  Service: Endoscopy;  Laterality: N/A;  . EUS N/A 10/22/2018   Procedure: UPPER ENDOSCOPIC ULTRASOUND (EUS) RADIAL;  Surgeon: Milus Banister, MD;  Location: [facility] ENDOSCOPY;  Service: Endoscopy;  Laterality: N/A;  . fibroid excision  age 67   prolapsed fibroid removed  . FINE NEEDLE ASPIRATION  10/22/2018   Procedure: FINE NEEDLE ASPIRATION;  Surgeon: Milus Banister, MD;  Location: [facility] ENDOSCOPY;  Service: Endoscopy;;  . FOOT SURGERY Left 2014   Dr. Paulla Dolly  . FOOT SURGERY Right  08/2016   Dr. Paulla Dolly --Shortening Osteotomy with Fixation 1st Metatarsal   . MOUTH SURGERY  06/2017   [facility]   . PORT-A-CATH REMOVAL N/A 02/15/2016   Procedure: REMOVAL PORT-A-CATH;  Surgeon: Autumn Messing III, MD;  Location: [facility];  Service: General;  Laterality: N/A;  . RADIOACTIVE SEED GUIDED PARTIAL MASTECTOMY WITH AXILLARY SENTINEL LYMPH NODE BIOPSY Left 05/25/2015   Procedure: LEFT BREAST LUMPECTOMY  WITH RADIOACTIVE SEED AND SENTINEL LYMPH NODE BIOPSY;  Surgeon: Autumn Messing III, MD;  Location: [facility];  Service: General;  Laterality: Left;  . UPPER GASTROINTESTINAL ENDOSCOPY      There were no vitals filed for this visit.   Subjective Assessment - 04/11/20 1022    Subjective Pt reported a few "really bad" dizzy episodes (two-three), one resulted in a fall when pt was in the shower. Pt feels more dizzy when she does not take Lamictal in the morning. Pt has an appt with neuro-ophthamology on 05/11/20. Pt reported HEP has been going well and dizziness has improved during exercise.    Pertinent History Hx of L breast CA (**no BP in LUE) s/p chemo/radiation, hx of lymphoma, foot surgery (bunions), depression, bipolar disorder, osteoporosis, plantar fasciitis, anemia    Patient Stated Goals For my head to clear up and for my balance to get where it was before.    Currently in Pain? No/denies              [facility] PT Assessment - 04/11/20 1032      Functional Gait  Assessment   Gait assessed  Yes    Gait Level Surface Walks 20 ft in less than 5.5 sec, no assistive devices, good speed, no evidence for imbalance, normal gait pattern, deviates no more than 6 in outside of the 12 in walkway width.    Change in Gait Speed Able to smoothly change walking speed without loss of balance or gait deviation. Deviate no more than 6 in outside of the 12 in walkway width.    Gait with Horizontal Head Turns Performs head turns smoothly with slight change in gait velocity (eg, minor disruption to smooth gait path), deviates 6-10 in outside 12 in walkway width, or uses an assistive device.    Gait with Vertical Head Turns Performs task with slight change in gait velocity (eg, minor disruption to smooth gait path), deviates 6 - 10 in outside 12 in walkway width or uses assistive device    Gait and Pivot Turn Pivot turns safely within 3 sec and stops quickly with no loss of balance.    Step Over  Obstacle Is able to step over 2 stacked shoe boxes taped together (9 in total height) without changing gait speed. No evidence of imbalance.    Gait with Narrow Base of Support Is able to ambulate for 10 steps heel to toe with no staggering.    Gait with Eyes Closed Walks 20 ft, uses assistive device, slower speed, mild gait deviations, deviates 6-10 in outside 12 in walkway width. Ambulates 20 ft in less than 9 sec but greater than 7 sec.    Ambulating Backwards Walks 20 ft, no assistive devices, good speed, no evidence for imbalance, normal gait    Steps Alternating feet, no rail.    Total Score 27    FGA comment: 27/30: low falls risk             JTT:[id number] in corner or at counter with chair in front of pt for  safety. Cues and demo for proper technique. Balance activities performed with feet apart and together. See HEP for details. Pt reported mild unsteadiness during activities but tolerable. Incr. Postural sway noted during activities which require incr. Vestibular input.  Access Code: [email or web address] URL: [email or web address] Date: 04/11/2020 Prepared by: Geoffry Paradise  Exercises Walking with Head Rotation - 1 x daily - 5 x weekly - 1 sets - 4 reps Walking with Eyes Closed and Counter Support - 1 x daily - 5 x weekly - 1 sets - 4 reps Romberg Stance Eyes Closed on Foam Pad - 1 x daily - 5 x weekly - 1 sets - 3 reps - 10-30 hold Romberg Stance with Head Nods on Foam Pad - 1 x daily - 5 x weekly - 1 sets - 10 reps              Vestibular Treatment/Exercise - 04/11/20 1030      Vestibular Treatment/Exercise   Vestibular Treatment Provided Gaze    Gaze Exercises X1 Viewing Horizontal;X1 Viewing Vertical      X1 Viewing Horizontal   Reps 2    Comments Cues and demo for proper technique (chin tuck and slow speed to decr. target from moving). Reviewed HEP 30 sec. time      X1 Viewing Vertical   Reps 2    Comments Cues and demo for proper technique (chin  tuck and slow speed to decr. target from moving). Reviewed HEP 30 sec. time. 5/10 dizziness but tolerable per pt.                 PT Education - 04/11/20 1214    Education Details PT reviewed x1 viewing HEP with pt and added vestibular/balance to HEP.    Person(s) Educated Patient    Methods Explanation;Demonstration;Verbal cues;Handout    Comprehension Returned demonstration;Verbalized understanding;Need further instruction            PT Short Term Goals - 03/27/20 1825      PT SHORT TERM GOAL #1   Title Pt will be IND in initial HEP to improve dizziness and balance. TARGET DATE FOR ALL STGS:04/24/20    Time 4    Period Weeks    Status New      PT SHORT TERM GOAL #2   Title Perform SOT and FGA and write STG/LTG as indicated.    Time 4    Period Weeks    Status New      PT SHORT TERM GOAL #3   Title Pt will amb. 500' over even terrain, while performing head turns/nods, IND to improve functional mobility.    Time 4    Period Weeks    Status New             PT Long Term Goals - 04/11/20 1220      PT LONG TERM GOAL #1   Title Pt will report no falls in the last four weeks to improve safety during functional mobility. TARGET DATE FOR LTGS: 05/22/20    Time 8    Period Weeks    Status New      PT LONG TERM GOAL #2   Title Pt will amb. 1000' over paved and uneven terrain, while performing head turns/nods, IND to improve safety during functional mobility.    Time 8    Period Weeks    Status New      PT LONG TERM GOAL #3   Title Pt will be IND in progressed HEP to improve  dizziness and balance.    Time 8    Period Weeks    Status New      PT LONG TERM GOAL #4   Title Pt will improve FGA score to 30/30 to decr. falls risk.    Baseline 27/30    Time 8    Period Weeks    Status New                 Plan - 04/11/20 1216    Clinical Impression Statement Pt's FGA score indicated pt is at low risk for falls. Pt demonstrated progress with HEP, as she  reported dizziness has decr. during HEP, however, pt did require cues for proper technique during HEP. Incr. postural sway noted during FGA and balance activities which require vestibular system input. Therefore, PT added activities to HEP to incr. vestibular input. Pt would continue to benefit from skilled PT to improve safety during functional mobility.    Personal Factors and Comorbidities Age;Comorbidity 3+;Past/Current Experience;Time since onset of injury/illness/exacerbation    Comorbidities Hx of L breast CA (**no BP in LUE) s/p chemo/radiation, hx of lymphoma, foot surgery (bunions), depression, bipolar disorder, osteoporosis, plantar fasciitis, anemia    Examination-Activity Limitations Bathing;Locomotion Level;Stairs;Transfers;Caring for Others   pt's sister will visit pt soon and she has to assist her sister to the bathroom and she's fearful of the steps   Examination-Participation Restrictions Cleaning;Yard Work;Interpersonal Relationship    Stability/Clinical Decision Making Evolving/Moderate complexity    Rehab Potential Good    PT Frequency 2x / week    PT Duration 8 weeks    PT Treatment/Interventions ADLs/Self Care Home Management;Aquatic Therapy;Biofeedback;Canalith Repostioning;DME Instruction;Balance training;Neuromuscular re-education;Manual techniques;Therapeutic exercise;Therapeutic activities;Functional mobility training;Patient/family education;Stair training;Gait training;Cognitive remediation;Vestibular    PT Next Visit Plan **NO BP IN LUE** Perform SOT and write goals as indicated. Review x1 viewing and balance HEP-progress as tolerated.    PT Home Exercise Plan x1 viewing and corner/counter balance [email or web address] in [employer]    Consulted and Agree with Plan of Care Patient           Patient will benefit from skilled therapeutic intervention in order to improve the following deficits and impairments:  Abnormal gait, Dizziness, Decreased activity tolerance, Decreased  knowledge of use of DME, Decreased balance, Decreased cognition, Decreased mobility, Decreased strength, Impaired sensation, Impaired flexibility  Visit Diagnosis: Dizziness and giddiness  Other abnormalities of gait and mobility     Problem List Patient Active Problem List   Diagnosis Date Noted  . Sleep disturbance 04/04/2020  . Post concussive syndrome 03/02/2020  . Dizziness and giddiness 03/02/2020  . Follicular lymphoma ([place]) 11/16/2018  . Duodenum disorder   . Upper gastrointestinal bleed 07/02/2018  . Abdominal pain, epigastric 07/01/2018  . Elevated lipase 07/01/2018  . Elevated LFTs 07/01/2018  . Iron deficiency anemia 08/17/2017  . C. difficile colitis 08/17/2017  . Dehydration 08/17/2017  . Hypokalemia 08/17/2017  . Symptomatic anemia 07/16/2017  . Bipolar 1 disorder ([place]) 07/16/2017  . Hypotension due to hypovolemia 07/16/2017  . Melena   . Heme positive stool   . Chronic duodenal ulcer with hemorrhage   . Blood transfusion without reported diagnosis 07/14/2017  . Osteoporosis 07/29/2016  . Genetic counseling and testing 01/29/2016  . Pain in joint, pelvic region and thigh 06/12/2015  . 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 Allergy to adhesive tape 01/25/2015  . Malignant neoplasm of upper-outer quadrant of left breast in female, estrogen receptor negative ([place]) 12/22/2014    Davionne Mastrangelo L 04/11/2020, 12:23  PM  [facility] [address] [place], [place], [address] Phone: [phone]   Fax:  [phone]  Name: Rhonda Gardner MRN: [id number] Date of Birth: 07/31/1956  Geoffry Paradise, PT,DPT 04/11/20 12:23 PM Phone: [phone] Fax: [phone]

## 2020-04-20 ENCOUNTER — Ambulatory Visit: Payer: Medicare Other | Admitting: Physical Therapy

## 2020-04-24 ENCOUNTER — Ambulatory Visit: Payer: Medicare Other | Attending: Physical Medicine & Rehabilitation | Admitting: Physical Therapy

## 2020-04-24 ENCOUNTER — Other Ambulatory Visit: Payer: Self-pay

## 2020-04-24 ENCOUNTER — Encounter: Payer: Self-pay | Admitting: Physical Therapy

## 2020-04-24 DIAGNOSIS — R2689 Other abnormalities of gait and mobility: Secondary | ICD-10-CM | POA: Diagnosis present

## 2020-04-24 DIAGNOSIS — R209 Unspecified disturbances of skin sensation: Secondary | ICD-10-CM | POA: Diagnosis present

## 2020-04-24 DIAGNOSIS — R208 Other disturbances of skin sensation: Secondary | ICD-10-CM

## 2020-04-24 DIAGNOSIS — R42 Dizziness and giddiness: Secondary | ICD-10-CM | POA: Diagnosis not present

## 2020-04-24 NOTE — Therapy (Signed)
[facility] [address] [place], [place], [address] Phone: [phone]   Fax:  [phone]  Physical Therapy Treatment  Patient Details  Name: Rhonda Gardner MRN: [id number] Date of Birth: [birth year] Referring Provider (PT): Delice Lesch, MD   Encounter Date: 04/24/2020   PT End of Session - 04/24/20 1101    Visit Number 3    Number of Visits 17    Date for PT Re-Evaluation 06/25/20    Authorization Type Medicare: PN every 10th visit.    Progress Note Due on Visit 10    PT Start Time 1017    PT Stop Time 1100    PT Time Calculation (min) 43 min    Activity Tolerance Patient tolerated treatment well    Behavior During Therapy WFL for tasks assessed/performed           Past Medical History:  Diagnosis Date  . Allergy 2006   chemical cauterization in spring 2006, and 2nd treatment with good results.(Dr.Shoemaker)  . Bipolar disorder ([place])   . Bleeding ulcer   . Blood transfusion without reported diagnosis 07/2017   Bleeding ulcer  . Breast cancer ([place]) 12/2014   invasive ductal carcinoma (LEFT)  . Breast cancer of upper-outer quadrant of left female breast ([place]) 12/22/2014  . Breast hematoma 12/19/2015   had left breast aspiration of 4cm hematoma-path indicates benign hematoma  . C. difficile colitis   . C. difficile diarrhea   . Depression   . 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 Osteoporosis 06/20/2016   T-2.5@spine  on Dexa ([facility])  . Plantar fasciitis   . PONV (postoperative nausea and vomiting)   . Symptomatic anemia 07/16/2017    Past Surgical History:  Procedure Laterality Date  . BIOPSY  07/03/2018   Procedure: BIOPSY;  Surgeon: Gatha Mayer, MD;  Location: [facility];  Service: Endoscopy;;  . BIOPSY  09/02/2018   Procedure: BIOPSY;  Surgeon: Gatha Mayer, MD;  Location: [facility];  Service: Endoscopy;;  . BIOPSY  10/22/2018    Procedure: BIOPSY;  Surgeon: Milus Banister, MD;  Location: [facility];  Service: Endoscopy;;  . BREAST BIOPSY Bilateral    benign and a right stereotatic breast biopsy.  . COLONOSCOPY    . ESOPHAGOGASTRODUODENOSCOPY N/A 07/16/2017   Procedure: ESOPHAGOGASTRODUODENOSCOPY (EGD);  Surgeon: Ladene Artist, MD;  Location[facility];  Service: Endoscopy;  Laterality: N/A;  . ESOPHAGOGASTRODUODENOSCOPY N/A 10/22/2018   Procedure: ESOPHAGOGASTRODUODENOSCOPY (EGD);  Surgeon: Milus Banister, MD;  Location: [facility];  Service: Endoscopy;  Laterality: N/A;  . ESOPHAGOGASTRODUODENOSCOPY (EGD) WITH PROPOFOL N/A 07/03/2018   Procedure: ESOPHAGOGASTRODUODENOSCOPY (EGD) WITH PROPOFOL;  Surgeon: Gatha Mayer, MD;  Location: [facility];  Service: Endoscopy;  Laterality: N/A;  . ESOPHAGOGASTRODUODENOSCOPY (EGD) WITH PROPOFOL N/A 09/02/2018   Procedure: ESOPHAGOGASTRODUODENOSCOPY (EGD) WITH PROPOFOL;  Surgeon: Gatha Mayer, MD;  Location: [facility];  Service: Endoscopy;  Laterality: N/A;  . EUS N/A 10/22/2018   Procedure: UPPER ENDOSCOPIC ULTRASOUND (EUS) RADIAL;  Surgeon: Milus Banister, MD;  Location: [facility];  Service: Endoscopy;  Laterality: N/A;  . fibroid excision  age 64   prolapsed fibroid removed  . FINE NEEDLE ASPIRATION  10/22/2018   Procedure: FINE NEEDLE ASPIRATION;  Surgeon: Milus Banister, MD;  Location: [facility];  Service: Endoscopy;;  . FOOT SURGERY Left 2014   Dr. Paulla Dolly  . FOOT SURGERY Right 08/2016   Dr. Paulla Dolly --Shortening Osteotomy with Fixation 1st  Metatarsal   . MOUTH SURGERY  06/2017   [facility]   . PORT-A-CATH REMOVAL N/A 02/15/2016   Procedure: REMOVAL PORT-A-CATH;  Surgeon: Autumn Messing III, MD;  Location: [facility];  Service: General;  Laterality: N/A;  . RADIOACTIVE SEED GUIDED PARTIAL MASTECTOMY WITH AXILLARY SENTINEL LYMPH NODE BIOPSY Left 05/25/2015   Procedure: LEFT BREAST LUMPECTOMY WITH RADIOACTIVE SEED AND SENTINEL LYMPH NODE BIOPSY;   Surgeon: Autumn Messing III, MD;  Location: [facility];  Service: General;  Laterality: Left;  . UPPER GASTROINTESTINAL ENDOSCOPY      There were no vitals filed for this visit.   Subjective Assessment - 04/24/20 1021    Subjective No falls since last visit but still loses balance.  No further episodes of significant dizziness; seems stable, no better and no worse.  HEP is going well - letter exercise is helping, eyes closed on soft surface is still difficult, needs more space to perform walking with head turns.    Pertinent History Hx of L breast CA (**no BP in LUE) s/p chemo/radiation, hx of lymphoma, foot surgery (bunions), depression, bipolar disorder, osteoporosis, plantar fasciitis, anemia    Patient Stated Goals For my head to clear up and for my balance to get where it was before.    Currently in Pain? No/denies   return of plantar fasciitis.  Rates dizziness 5-6/10.          Gaze Stabilization: Tip Card  1.Target must remain in focus, not blurry, and appear stationary while head is in motion. 2.Perform exercises with small head movements (45 to either side of midline). 3.Increase speed of head motion so long as target is in focus. 4.If you wear eyeglasses, be sure you can see target through lens (therapist will give specific instructions for bifocal / progressive lenses). 5.These exercises may provoke dizziness or nausea. Work through these symptoms. If too dizzy, slow head movement slightly. Rest between each exercise. 6.Exercises demand concentration; avoid distractions. 7.For safety, perform standing exercises close to a counter, wall, corner, or next to someone.  Copyright  VHI. All rights reserved.   Gaze Stabilization: Sitting    Keeping eyes on target on wall 5 feet away, tilt head down 15-30 and move head side to side for __60_ seconds (like you are saying "no"). Repeat while moving head up and down (like you are saying "yes") for __60__ seconds.  You  should feel mild symptoms but they should settle quickly.  Do __2-3__ sessions per day. **Educated pt on speed and ROM of head movement and increased time to perform with pt reporting mild symptoms**.  Access Code: [email or web address] URL: [email or web address] Date: 04/24/2020 Prepared by: Misty Stanley  Exercises Walking with Head Rotation - 1 x daily - 5 x weekly - 1 sets - 4 reps - recommended and practiced using wall in hallway to perform to give pt more length and recommended pt slow down head turns and to increased step/stride length due to pt shortening steps and turning head too rapidly with multiple LOB. Walking with Eyes Closed and Counter Support - 1 x daily - 5 x weekly - 1 sets - 4 reps - also recommended pt use hand wall in hallway to give pt more length to perform Romberg Stance Eyes Closed on Foam Pad - 1 x daily - 5 x weekly - 1 sets - 3 reps - 30 hold - pt performed; still moderate sway so no changes Romberg Stance with Head Nods on Foam Pad - 1 x  daily - 5 x weekly - 2 sets - 10 reps - Reviewed and clarified eyes open for this exercise    PT Education - 04/24/20 1101    Education Details reviewed and updated HEP; plan for next few visits    Person(s) Educated Patient    Methods Explanation;Demonstration;Handout    Comprehension Verbalized understanding;Returned demonstration            PT Short Term Goals - 03/27/20 1825      PT SHORT TERM GOAL #1   Title Pt will be IND in initial HEP to improve dizziness and balance. TARGET DATE FOR ALL STGS:04/24/20    Time 4    Period Weeks    Status New      PT SHORT TERM GOAL #2   Title Perform SOT and FGA and write STG/LTG as indicated.    Time 4    Period Weeks    Status New      PT SHORT TERM GOAL #3   Title Pt will amb. 500' over even terrain, while performing head turns/nods, IND to improve functional mobility.    Time 4    Period Weeks    Status New             PT Long Term Goals - 04/11/20 1220        PT LONG TERM GOAL #1   Title Pt will report no falls in the last four weeks to improve safety during functional mobility. TARGET DATE FOR LTGS: 05/22/20    Time 8    Period Weeks    Status New      PT LONG TERM GOAL #2   Title Pt will amb. 1000' over paved and uneven terrain, while performing head turns/nods, IND to improve safety during functional mobility.    Time 8    Period Weeks    Status New      PT LONG TERM GOAL #3   Title Pt will be IND in progressed HEP to improve dizziness and balance.    Time 8    Period Weeks    Status New      PT LONG TERM GOAL #4   Title Pt will improve FGA score to 30/30 to decr. falls risk.    Baseline 27/30    Time 8    Period Weeks    Status New                 Plan - 04/24/20 1057    Clinical Impression Statement Pt returns with questions about HEP; reviewed and adjusted walking with EC and with head movements to improve effectiveness.  Continued to educate pt on correct technique for x1 viewing with pt return demonstrating all safely.  Held on checking STG due to this being patient's second visit since eval due to scheduling constraints and pt having to cancel last week.  Will assess STG next week.    Personal Factors and Comorbidities Age;Comorbidity 3+;Past/Current Experience;Time since onset of injury/illness/exacerbation    Comorbidities Hx of L breast CA (**no BP in LUE) s/p chemo/radiation, hx of lymphoma, foot surgery (bunions), depression, bipolar disorder, osteoporosis, plantar fasciitis, anemia    Examination-Activity Limitations Bathing;Locomotion Level;Stairs;Transfers;Caring for Others   pt's sister will visit pt soon and she has to assist her sister to the bathroom and she's fearful of the steps   Examination-Participation Restrictions Cleaning;Yard Work;Interpersonal Relationship    Stability/Clinical Decision Making Evolving/Moderate complexity    Rehab Potential Good    PT Frequency  2x / week    PT Duration 8 weeks     PT Treatment/Interventions ADLs/Self Care Home Management;Aquatic Therapy;Biofeedback;Canalith Repostioning;DME Instruction;Balance training;Neuromuscular re-education;Manual techniques;Therapeutic exercise;Therapeutic activities;Functional mobility training;Patient/family education;Stair training;Gait training;Cognitive remediation;Vestibular    PT Next Visit Plan **NO BP IN LUE** Perform SOT and write goals as indicated. 7/20 check STG.  Review x1 viewing and balance HEP-progress as tolerated.    PT Home Exercise Plan x1 viewing and corner/counter balance [email or web address] in [employer]    Consulted and Agree with Plan of Care Patient           Patient will benefit from skilled therapeutic intervention in order to improve the following deficits and impairments:  Abnormal gait, Dizziness, Decreased activity tolerance, Decreased knowledge of use of DME, Decreased balance, Decreased cognition, Decreased mobility, Decreased strength, Impaired sensation, Impaired flexibility  Visit Diagnosis: Dizziness and giddiness  Other abnormalities of gait and mobility  Other disturbances of skin sensation     Problem List Patient Active Problem List   Diagnosis Date Noted  . Sleep disturbance 04/04/2020  . Post concussive syndrome 03/02/2020  . Dizziness and giddiness 03/02/2020  . Follicular lymphoma ([place]) 11/16/2018  . Duodenum disorder   . Upper gastrointestinal bleed 07/02/2018  . Abdominal pain, epigastric 07/01/2018  . Elevated lipase 07/01/2018  . Elevated LFTs 07/01/2018  . Iron deficiency anemia 08/17/2017  . C. difficile colitis 08/17/2017  . Dehydration 08/17/2017  . Hypokalemia 08/17/2017  . Symptomatic anemia 07/16/2017  . Bipolar 1 disorder ([place]) 07/16/2017  . Hypotension due to hypovolemia 07/16/2017  . Melena   . Heme positive stool   . Chronic duodenal ulcer with hemorrhage   . Blood transfusion without reported diagnosis 07/14/2017  . Osteoporosis 07/29/2016  . Genetic  counseling and testing 01/29/2016  . Pain in joint, pelvic region and thigh 06/12/2015  . 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 Allergy to adhesive tape 01/25/2015  . Malignant neoplasm of upper-outer quadrant of left breast in female, estrogen receptor negative ([place]) 12/22/2014   Rico Junker, PT, DPT 04/25/20    3:55 PM    [facility] [address] [place], [place], [address] Phone: [phone]   Fax:  [phone]  Name: Mersadies Petree Gardner MRN: [id number] Date of Birth: 03/13/1956

## 2020-04-24 NOTE — Patient Instructions (Addendum)
Gaze Stabilization: Tip Card  1.Target must remain in focus, not blurry, and appear stationary while head is in motion. 2.Perform exercises with small head movements (45 to either side of midline). 3.Increase speed of head motion so long as target is in focus. 4.If you wear eyeglasses, be sure you can see target through lens (therapist will give specific instructions for bifocal / progressive lenses). 5.These exercises may provoke dizziness or nausea. Work through these symptoms. If too dizzy, slow head movement slightly. Rest between each exercise. 6.Exercises demand concentration; avoid distractions. 7.For safety, perform standing exercises close to a counter, wall, corner, or next to someone.  Copyright  VHI. All rights reserved.   Gaze Stabilization: Sitting    Keeping eyes on target on wall 5 feet away, tilt head down 15-30 and move head side to side for __60_ seconds (like you are saying "no"). Repeat while moving head up and down (like you are saying "yes") for __60__ seconds.  You should feel mild symptoms but they should settle quickly.  Do __2-3__ sessions per day. Copyright  VHI. All rights reserved.    Access Code: [email or web address] URL: [email or web address] Date: 04/24/2020 Prepared by: Misty Stanley  Exercises Walking with Head Rotation - 1 x daily - 5 x weekly - 1 sets - 4 reps Walking with Eyes Closed and Counter Support - 1 x daily - 5 x weekly - 1 sets - 4 reps Romberg Stance Eyes Closed on Foam Pad - 1 x daily - 5 x weekly - 1 sets - 3 reps - 30 hold Romberg Stance with Head Nods on Foam Pad - 1 x daily - 5 x weekly - 2 sets - 10 reps

## 2020-04-26 ENCOUNTER — Encounter: Payer: Self-pay | Admitting: Physical Therapy

## 2020-04-26 ENCOUNTER — Ambulatory Visit: Payer: Medicare Other | Admitting: Physical Therapy

## 2020-04-26 ENCOUNTER — Other Ambulatory Visit: Payer: Self-pay

## 2020-04-26 DIAGNOSIS — R208 Other disturbances of skin sensation: Secondary | ICD-10-CM

## 2020-04-26 DIAGNOSIS — R42 Dizziness and giddiness: Secondary | ICD-10-CM | POA: Diagnosis not present

## 2020-04-26 DIAGNOSIS — R2689 Other abnormalities of gait and mobility: Secondary | ICD-10-CM

## 2020-04-26 NOTE — Therapy (Signed)
[facility] [address] [place], [place], [address] Phone: [phone]   Fax:  [phone]  Physical Therapy Treatment  Patient Details  Name: Rhonda Gardner MRN: [id number] Date of Birth: Jun 30, 1956 Referring Provider (PT): Delice Lesch, MD   Encounter Date: 04/26/2020   PT End of Session - 04/26/20 0948    Visit Number 4    Number of Visits 17    Date for PT Re-Evaluation 06/25/20    Authorization Type Medicare: PN every 10th visit.    Progress Note Due on Visit 10    PT Start Time [phone]    PT Stop Time 0930    PT Time Calculation (min) 38 min    Activity Tolerance Patient tolerated treatment well    Behavior During Therapy [facility] for tasks assessed/performed           Past Medical History:  Diagnosis Date  . Allergy 2006   chemical cauterization in spring 2006, and 2nd treatment with good results.(Dr.Shoemaker)  . Bipolar disorder ([place])   . Bleeding ulcer   . Blood transfusion without reported diagnosis 07/2017   Bleeding ulcer  . Breast cancer ([place]) 12/2014   invasive ductal carcinoma (LEFT)  . Breast cancer of upper-outer quadrant of left female breast ([place]) 12/22/2014  . Breast hematoma 12/19/2015   had left breast aspiration of 4cm hematoma-path indicates benign hematoma  . C. difficile colitis   . C. difficile diarrhea   . Depression   . 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 Osteoporosis 06/20/2016   T-2.5@spine  on Dexa ([facility])  . Plantar fasciitis   . PONV (postoperative nausea and vomiting)   . Symptomatic anemia 07/16/2017    Past Surgical History:  Procedure Laterality Date  . BIOPSY  07/03/2018   Procedure: BIOPSY;  Surgeon: Gatha Mayer, MD;  Location: [facility];  Service: Endoscopy;;  . BIOPSY  09/02/2018   Procedure: BIOPSY;  Surgeon: Gatha Mayer, MD;  Location: [facility];  Service: Endoscopy;;  . BIOPSY  10/22/2018    Procedure: BIOPSY;  Surgeon: Milus Banister, MD;  Location: [facility];  Service: Endoscopy;;  . BREAST BIOPSY Bilateral    benign and a right stereotatic breast biopsy.  . COLONOSCOPY    . ESOPHAGOGASTRODUODENOSCOPY N/A 07/16/2017   Procedure: ESOPHAGOGASTRODUODENOSCOPY (EGD);  Surgeon: Ladene Artist, MD;  Location: [facility] ENDOSCOPY;  Service: Endoscopy;  Laterality: N/A;  . ESOPHAGOGASTRODUODENOSCOPY N/A 10/22/2018   Procedure: ESOPHAGOGASTRODUODENOSCOPY (EGD);  Surgeon: Milus Banister, MD;  Location: [facility];  Service: Endoscopy;  Laterality: N/A;  . ESOPHAGOGASTRODUODENOSCOPY (EGD) WITH PROPOFOL N/A 07/03/2018   Procedure: ESOPHAGOGASTRODUODENOSCOPY (EGD) WITH PROPOFOL;  Surgeon: Gatha Mayer, MD;  Location: [facility];  Service: Endoscopy;  Laterality: N/A;  . ESOPHAGOGASTRODUODENOSCOPY (EGD) WITH PROPOFOL N/A 09/02/2018   Procedure: ESOPHAGOGASTRODUODENOSCOPY (EGD) WITH PROPOFOL;  Surgeon: Gatha Mayer, MD;  Location: [facility];  Service: Endoscopy;  Laterality: N/A;  . EUS N/A 10/22/2018   Procedure: UPPER ENDOSCOPIC ULTRASOUND (EUS) RADIAL;  Surgeon: Milus Banister, MD;  Location: [facility];  Service: Endoscopy;  Laterality: N/A;  . fibroid excision  age 20   prolapsed fibroid removed  . FINE NEEDLE ASPIRATION  10/22/2018   Procedure: FINE NEEDLE ASPIRATION;  Surgeon: Milus Banister, MD;  Location: [facility];  Service: Endoscopy;;  . FOOT SURGERY Left 2014   Dr. Paulla Dolly  . FOOT SURGERY Right 08/2016   Dr. Paulla Dolly --Shortening Osteotomy with Fixation 1st  Metatarsal   . MOUTH SURGERY  06/2017   [facility]   . PORT-A-CATH REMOVAL N/A 02/15/2016   Procedure: REMOVAL PORT-A-CATH;  Surgeon: Autumn Messing III, MD;  Location: [facility];  Service: General;  Laterality: N/A;  . RADIOACTIVE SEED GUIDED PARTIAL MASTECTOMY WITH AXILLARY SENTINEL LYMPH NODE BIOPSY Left 05/25/2015   Procedure: LEFT BREAST LUMPECTOMY WITH RADIOACTIVE SEED AND SENTINEL LYMPH NODE BIOPSY;   Surgeon: Autumn Messing III, MD;  Location: [facility];  Service: General;  Laterality: Left;  . UPPER GASTROINTESTINAL ENDOSCOPY      There were no vitals filed for this visit.   Subjective Assessment - 04/26/20 0856    Subjective Found a better place for walking exercises - front deck. Shaded.  Felt slightly dizzy after last session.    Pertinent History Hx of L breast CA (**no BP in LUE) s/p chemo/radiation, hx of lymphoma, foot surgery (bunions), depression, bipolar disorder, osteoporosis, plantar fasciitis, anemia    Patient Stated Goals For my head to clear up and for my balance to get where it was before.    Currently in Pain? No/denies                   Vestibular Assessment - 04/26/20 0859      Balancemaster   Balancemaster Sensory organization test    Balancemaster Comment mild dizziness starting. Composite score 77 - WNL.  See full results below.  Pt educated on results and reduced somatosensory function which is consistent with patient's peripheral neuropathy diagnosis.           Conditions: 1: 2 trials WNL; one trial below 2: 3 trials below normal limits 3:  1st trial below normal, 2 trials WNL 4: WNL 5: WNL 6: WNL Composite score: 77 Sensory Analysis Som: Below normal Vis: WNL Vest: WNL Pref: WNL Strategy analysis: Condition 6 pt demonstrated only 50% hip/ankle strategy COG alignment: Pt with slightly posterior COG                Balance Exercises - 04/26/20 0947      Balance Exercises: Standing   Standing Eyes Closed Narrow base of support (BOS);Wide (BOA);Solid surface;Other reps (comment)   10 reps limits of stability ant/post, lateral L/R         Gaze Stabilization: Tip Card  1.Target must remain in focus, not blurry, and appear stationary while head is in motion. 2.Perform exercises with small head movements (45 to either side of midline). 3.Increase speed of head motion so long as target is in focus. 4.If you  wear eyeglasses, be sure you can see target through lens (therapist will give specific instructions for bifocal / progressive lenses). 5.These exercises may provoke dizziness or nausea. Work through these symptoms. If too dizzy, slow head movement slightly. Rest between each exercise. 6.Exercises demand concentration; avoid distractions. 7.For safety, perform standing exercises close to a counter, wall, corner, or next to someone.  Copyright  VHI. All rights reserved.   Gaze Stabilization: Sitting    Keeping eyes on target on wall 5 feet away, tilt head down 15-30 and move head side to side for __60_ seconds (like you are saying "no"). Repeat while moving head up and down (like you are saying "yes") for __60__ seconds.  You should feel mild symptoms but they should settle quickly.  Do __2-3__ sessions per day. Copyright  VHI. All rights reserved.    Access Code: [email or web address] URL: [email or web address] Date: 04/24/2020 Prepared by: Misty Stanley  Exercises Walking  with Head Rotation - 1 x daily - 5 x weekly - 1 sets - 4 reps Walking with Eyes Closed and Counter Support - 1 x daily - 5 x weekly - 1 sets - 4 reps Romberg Stance Eyes Closed on Foam Pad - 1 x daily - 5 x weekly - 1 sets - 3 reps - 30 hold - To this exercise - added limits of stability training with controlled weight shifting anterior/posterior, laterally left and right.  Will also add diagonals. Romberg Stance with Head Nods on Foam Pad - 1 x daily - 5 x weekly - 2 sets - 10 reps      PT Education - 04/26/20 0948    Education Details reviewed results of SOT and updated corner balance HEP to include limits of stability    Person(s) Educated Patient    Methods Explanation;Demonstration    Comprehension Verbalized understanding;Returned demonstration            PT Short Term Goals - 04/26/20 0949      PT SHORT TERM GOAL #1   Title Pt will be IND in initial HEP to improve dizziness and balance. TARGET  DATE FOR ALL STGS:04/24/20    Time 4    Period Weeks    Status New      PT SHORT TERM GOAL #2   Title Perform SOT and FGA and write STG/LTG as indicated.    Time 4    Period Weeks    Status Achieved      PT SHORT TERM GOAL #3   Title Pt will amb. 500' over even terrain, while performing head turns/nods, IND to improve functional mobility.    Time 4    Period Weeks    Status New             PT Long Term Goals - 04/26/20 0949      PT LONG TERM GOAL #1   Title Pt will report no falls in the last four weeks to improve safety during functional mobility. TARGET DATE FOR LTGS: 05/22/20    Time 8    Period Weeks    Status New      PT LONG TERM GOAL #2   Title Pt will amb. 1000' over paved and uneven terrain, while performing head turns/nods, IND to improve safety during functional mobility.    Time 8    Period Weeks    Status New      PT LONG TERM GOAL #3   Title Pt will be IND in progressed HEP to improve dizziness and balance.    Time 8    Period Weeks    Status New      PT LONG TERM GOAL #4   Title Pt will improve FGA score to 30/30 to decr. falls risk.    Baseline 27/30    Time 8    Period Weeks    Status New      PT LONG TERM GOAL #5   Title Pt will demonstrate improved use of somatosensory information for standing balance as indicated on SOT by increase in [facility] to >95% on sensory analysis (will maintain composite score of >77)    Baseline Sensory analysis for [facility]: 90%    Status New    Target Date 05/22/20                 Plan - 04/26/20 6213    Clinical Impression Statement Performed assessment of sensory integration with SOT with pt demonstrating WNL  overall composite score and WNL use of visual and vestibular systems but decreased use of somatosensory system which is consistent with PN.  Updated corner balance to begin to work on limits of stability.  Pt reported slightly increased dizziness after SOT but returned to baseline quickly.    Personal Factors  and Comorbidities Age;Comorbidity 3+;Past/Current Experience;Time since onset of injury/illness/exacerbation    Comorbidities Hx of L breast CA (**no BP in LUE) s/p chemo/radiation, hx of lymphoma, foot surgery (bunions), depression, bipolar disorder, osteoporosis, plantar fasciitis, anemia    Examination-Activity Limitations Bathing;Locomotion Level;Stairs;Transfers;Caring for Others   pt's sister will visit pt soon and she has to assist her sister to the bathroom and she's fearful of the steps   Examination-Participation Restrictions Cleaning;Yard Work;Interpersonal Relationship    Stability/Clinical Decision Making Evolving/Moderate complexity    Rehab Potential Good    PT Frequency 2x / week    PT Duration 8 weeks    PT Treatment/Interventions ADLs/Self Care Home Management;Aquatic Therapy;Biofeedback;Canalith Repostioning;DME Instruction;Balance training;Neuromuscular re-education;Manual techniques;Therapeutic exercise;Therapeutic activities;Functional mobility training;Patient/family education;Stair training;Gait training;Cognitive remediation;Vestibular    PT Next Visit Plan **NO BP IN LUE** 7/20 check STG.  Review x1 viewing and balance HEP-progress as tolerated.  Work on balance with EC - decreased use of somatosensory due to PN.  Limits of stability training.    PT Home Exercise Plan x1 viewing and corner/counter balance [email or web address] in [employer]    Consulted and Agree with Plan of Care Patient           Patient will benefit from skilled therapeutic intervention in order to improve the following deficits and impairments:  Abnormal gait, Dizziness, Decreased activity tolerance, Decreased knowledge of use of DME, Decreased balance, Decreased cognition, Decreased mobility, Decreased strength, Impaired sensation, Impaired flexibility  Visit Diagnosis: Dizziness and giddiness  Other abnormalities of gait and mobility  Other disturbances of skin sensation     Problem List Patient  Active Problem List   Diagnosis Date Noted  . Sleep disturbance 04/04/2020  . Post concussive syndrome 03/02/2020  . Dizziness and giddiness 03/02/2020  . Follicular lymphoma ([place]) 11/16/2018  . Duodenum disorder   . Upper gastrointestinal bleed 07/02/2018  . Abdominal pain, epigastric 07/01/2018  . Elevated lipase 07/01/2018  . Elevated LFTs 07/01/2018  . Iron deficiency anemia 08/17/2017  . C. difficile colitis 08/17/2017  . Dehydration 08/17/2017  . Hypokalemia 08/17/2017  . Symptomatic anemia 07/16/2017  . Bipolar 1 disorder ([place]) 07/16/2017  . Hypotension due to hypovolemia 07/16/2017  . Melena   . Heme positive stool   . Chronic duodenal ulcer with hemorrhage   . Blood transfusion without reported diagnosis 07/14/2017  . Osteoporosis 07/29/2016  . Genetic counseling and testing 01/29/2016  . Pain in joint, pelvic region and thigh 06/12/2015  . 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 Allergy to adhesive tape 01/25/2015  . Malignant neoplasm of upper-outer quadrant of left breast in female, estrogen receptor negative ([place]) 12/22/2014   Rico Junker, PT, DPT 04/26/20    9:59 AM    [facility] [address] [place], [place], [address] Phone: [phone]   Fax:  [phone]  Name: Rhonda Gardner MRN: [id number] Date of Birth: 1956-06-27

## 2020-04-26 NOTE — Patient Instructions (Signed)
Gaze Stabilization: Tip Card  1.Target must remain in focus, not blurry, and appear stationary while head is in motion. 2.Perform exercises with small head movements (45 to either side of midline). 3.Increase speed of head motion so long as target is in focus. 4.If you wear eyeglasses, be sure you can see target through lens (therapist will give specific instructions for bifocal / progressive lenses). 5.These exercises may provoke dizziness or nausea. Work through these symptoms. If too dizzy, slow head movement slightly. Rest between each exercise. 6.Exercises demand concentration; avoid distractions. 7.For safety, perform standing exercises close to a counter, wall, corner, or next to someone.  Copyright  VHI. All rights reserved.   Gaze Stabilization: Sitting    Keeping eyes on target on wall 5 feet away, tilt head down 15-30 and move head side to side for __60_ seconds (like you are saying "no"). Repeat while moving head up and down (like you are saying "yes") for __60__ seconds.  You should feel mild symptoms but they should settle quickly.  Do __2-3__ sessions per day. Copyright  VHI. All rights reserved.    Access Code: [email or web address] URL: [email or web address] Date: 04/24/2020 Prepared by: Misty Stanley  Exercises Walking with Head Rotation - 1 x daily - 5 x weekly - 1 sets - 4 reps Walking with Eyes Closed and Counter Support - 1 x daily - 5 x weekly - 1 sets - 4 reps Romberg Stance Eyes Closed on Foam Pad - 1 x daily - 5 x weekly - 1 sets - 3 reps - 30 hold - To this exercise - added limits of stability training with controlled weight shifting anterior/posterior, laterally left and right.  Will also add diagonals. Romberg Stance with Head Nods on Foam Pad - 1 x daily - 5 x weekly - 2 sets - 10 reps

## 2020-04-26 NOTE — Patient Instructions (Addendum)
HEALTH MAINTENANCE RECOMMENDATIONS:  It is recommended that you get at least 30 minutes of aerobic exercise at least 5 days/week (for weight loss, you may need as much as 60-90 minutes). This can be any activity that gets your heart rate up. This can be divided in 10-15 minute intervals if needed, but try and build up your endurance at least once a week.  Weight bearing exercise is also recommended twice weekly.  Eat a healthy diet with lots of vegetables, fruits and fiber.  "Colorful" foods have a lot of vitamins (ie green vegetables, tomatoes, red peppers, etc).  Limit sweet tea, regular sodas and alcoholic beverages, all of which has a lot of calories and sugar.  Up to 1 alcoholic drink daily may be beneficial for women (unless trying to lose weight, watch sugars).  Drink a lot of water.  Calcium recommendations are 1200-1500 mg daily (1500 mg for postmenopausal women or women without ovaries), and vitamin D 1000 IU daily.  This should be obtained from diet and/or supplements (vitamins), and calcium should not be taken all at once, but in divided doses.  Monthly self breast exams and yearly mammograms for women over the age of 110 is recommended.  Sunscreen of at least SPF 30 should be used on all sun-exposed parts of the skin when outside between the hours of 10 am and 4 pm (not just when at beach or pool, but even with exercise, golf, tennis, and yard work!)  Use a sunscreen that says "broad spectrum" so it covers both UVA and UVB rays, and make sure to reapply every 1-2 hours.  Remember to change the batteries in your smoke detectors when changing your clock times in the spring and fall. Carbon monoxide detectors are recommended for you home.  Use your seat belt every time you are in a car, and please drive safely and not be distracted with cell phones and texting while driving.   [name] , Thank you for taking time to come for your Medicare Wellness Visit. I appreciate your ongoing  commitment to your health goals. Please review the following plan we discussed and let me know if I can assist you in the future.   This is a list of the screening recommended for you and due dates:  Health Maintenance  Topic Date Due  . Flu Shot  05/14/2020  . Mammogram  01/31/2021  . Pap Smear  04/13/2022  . Tetanus Vaccine  01/21/2023  . Colon Cancer Screening  07/08/2026  . DEXA scan (bone density measurement)  Completed  . COVID-19 Vaccine  Completed  .  Hepatitis C: One time screening is recommended by Center for Disease Control  (CDC) for  adults born from [birth year] through 1967.   Completed  . HIV Screening  Completed  . Pneumonia vaccines  Completed    I will need to check with Dr. Jana Hakim to see if you should get another Zometa infusion for osteoporosis, vs wait until your next bone density test, which is due in September.  Check with [name] to see if you have frequent long pauses in your breathing, or if it is just snoring that he notices.  If you tend to have pauses, and then snort/startle/gasp and wake up and continue to sleep, then I do think you need a sleep study. Not just for snoring.  Please get a copy of your living will and healthcare power of attorney at your convenience so that it can be scanned into your [facility] medical chart.

## 2020-04-26 NOTE — Progress Notes (Signed)
Chief Complaint  Patient presents with  . other    welcome to medicare, covid vaccine dates added    Rhonda Gardner is a 65 y.o. female who presents for Welcome to Medicare physical and follow-up on chronic medical conditions.   She has the following concerns:  H/o duodenal, low-grade follicular lymphoma, diagnosed 10/2018.  She was treated with radiation.  Post-treatment endoscopy and biopsy showed no evidence of residual lymphoma. She last saw Dr. Merleen Nicely in follow up in 03/2020.    H/o breast Cancer: She has been under the care of Dr. Jana Hakim.  She missed her last appointment, scheduled for 01/2020, but per letter sent, she was due to "graduate" after 5 years, so was released back to PCP.   Denies any breast concerns.  Osteoporosis:  This has been managed by Dr. Jana Hakim with zoledronate infusions, every 6 months. Per chart, appears to have been started 08/2016.  Last dose was 10/2019. Last DEXA was 06/2018, T-1.8 at spine.  Unsure of how long treatment was recommended, next DEXA due 06/2020, but dose would be due now.  She fell 02/2019 (slipped while walking her dog, fell hitting the back of her head) and suffered left frontal nondisplaced skull fracture extending to the roof of the left frontal sinus and extending through the anterior wall of the right frontal sinus. Posterior scalp hematoma also noted. She saw Dr. Saintclair Halsted in follow-up. No treatment was needed (per pt, no report was received). She lost her sense of taste and smell related to the fall (immediately after), saw Dr. Benjamine Mola about this. Still has altered taste, but is gradually improving.  She also reports today that she had slight CSF leak from the fracture, which resolved. She has had persistent problems with dizziness, balance deficits, and aphasia, exacerbated by lack of sleep. She finds that the aphasia has improved a lot. She has been under the care of PMR, Dr. Posey Pronto, diagnosed with post-concussive syndrome, and has been getting  rehab. She last saw Dr. Posey Pronto 04/04/20.  She hasn't noticed significant improvement yet, was told that because her symptoms are mild, it might take a long time. She was referred to a neuro-ophthalmologist, has appt next week.  His notes were reviewed--it was recommended to have B12 and D levels checked.  Also mentioned possible sleep study.  Apparently husband had mentioned to him that she snored a lot. She gets up frequently to void (3-4x, drinks water during the night, so expects this). She feels refreshed in the morning, and denies any daytime somnolence.  Bipolar disorder: Diagnosed age 25 or 86. She has been stable on her current regimen of lamictal. Moods are well controlled, no side effects.Occasionally feels just a little "discouraged" or frustrated, related to the dizziness.  She previously was under the care of Dr.Parrish Caprice Beaver and Lynne Leader retired).  We have been managing these medications, and she continues to do well. Hadn't tolerated generic lamictal in the [place] in the past (myalgias with generics here); currently is on a generic and is doing well, without side effects.  Moods are good.  Immunization History  Administered Date(s) Administered  . DTaP 10/14/1954, 10/15/1955, 10/15/1959  . Fluad Quad(high Dose 65+) 07/08/2019  . Gamma Globulin 08/23/1988  . Hepatitis A 11/02/1996, 09/29/2002  . Hepatitis A, Adult 11/02/1996, 09/29/2002  . Hepatitis B 11/11/2007  . Hepatitis B, adult 11/11/2007  . IPV 08/15/1988  . Influenza Split 11/11/2007, 07/13/2013  . Influenza,inj,Quad PF,6+ Mos 05/30/2016, 07/17/2017, 07/04/2018  . Influenza-Unspecified 07/31/2015, 05/30/2016, 07/17/2017  .  MMR 04/29/2003  . Measles 07/25/1988  . Meningococcal Polysaccharide 07/08/1988  . PFIZER SARS-COV-2 Vaccination 11/06/2019, 11/27/2019  . Pneumococcal Conjugate-13 04/08/2016  . Rubella 07/25/1988  . Td 10/15/1987, 11/02/1996, 01/23/1998  . Tdap 11/11/2007, 01/20/2013  . Typhoid  Live 11/11/2007  . Typhoid Parenteral 07/25/1988, 08/23/1988  . Zoster 12/01/2014  . Zoster Recombinat (Shingrix) 12/04/2017, 02/19/2018   Last Pap smear:04/2019--normal, no high risk HPV detected Last mammogram:01/2020 Last colonoscopy:06/2016--normal, internal hemorrhoids Last DEXA:06/2018 (T-1.8 at spine, improved from 06/2016 T-2.5) Dentist:every 6 months Ophtho:yearly, though past due Exercise: Walks slowly x 30-40 minutes 5 days/week, in her driveway, with hills. Hasn't been doing weight-bearing exercise, has at home.   Vitamin D-OH 61 on 12/01/14 Lipids: Lab Results  Component Value Date   CHOL 208 (H) 04/10/2017   HDL 102 04/10/2017   LDLCALC 88 04/10/2017   TRIG 89 04/10/2017   CHOLHDL 2.0 04/10/2017   Thyroid screen: Lab Results  Component Value Date   TSH 2.37 10/03/2017    Other doctors caring for patient include:  Dr. Velora Mediate at [facility] (lymphoma) Dr. Donell Beers oncology at [facility] Dr. Rhys Martini at [facility] (did EGD) Dr. Harriet Pho (breast CA); had also seen Dr. Irene Limbo for lymphoma prior to going to [facility] Dr. Thea Gist (saw for loss of taste after fall); has also seen Dr. Janace Hoard in the past (for cerumen impaction) Dr. Cram--neurosurgeon Dr. Patel--PMR Dr. Edison Nasuti, Dr. Delrae Rend at [facility]  Dr. Regal--podiatrist Dr. Tommi Rumps Dr. Satira Sark, Dr. Shelia Media Dr. Warren Lacy Jordan--derm Dr. Cher Nakai (scheduled to see)  Depression screen: negative Fall screen: slipped in the shower, no injury.  Her major fall (with injury) was 02/2019. Functional status survey: notable for difficulty walking (balance) Mini-Cog screen: normal  See epic for full screens  End of Life Discussion:  Patient has a living will and medical power of attorney  Outpatient Encounter Medications as of 04/27/2020  Medication Sig Note  . Alpha-Lipoic Acid 600 MG TABS Take by mouth.   Marland Kitchen ascorbic acid (VITAMIN C) 500 MG tablet Take by mouth.   .  Calcium-Magnesium (CALCIUM MAGNESIUM 750) 300-300 MG TABS Take by mouth. Bid   . carboxymethylcellulose (REFRESH PLUS) 0.5 % SOLN Place 1 drop into both eyes 3 (three) times daily as needed (dry eyes.).   Marland Kitchen Cholecalciferol (VITAMIN D-3) 125 MCG (5000 UT) TABS Take 5,000 Units by mouth daily. 04/14/2019: 3000-5000 daily  . fluticasone (FLONASE) 50 MCG/ACT nasal spray Place 1 spray into both nostrils daily.    Marland Kitchen ibuprofen (ADVIL) 200 MG tablet Take 200 mg by mouth every 6 (six) hours as needed. 04/27/2020: Using currently for PF flare  . lamoTRIgine (LAMICTAL) 200 MG tablet TAKE 1/2 TABLET(100 MG) BY MOUTH DAILY   . Lutein-Zeaxanthin 25-5 MG CAPS Take 1 capsule by mouth daily.   Marland Kitchen acetaminophen (TYLENOL) 325 MG tablet Take 650 mg by mouth every 6 (six) hours as needed (for pain.). (Patient not taking: Reported on 04/27/2020)   . ondansetron (ZOFRAN ODT) 4 MG disintegrating tablet Take 1 tablet (4 mg total) by mouth every 8 (eight) hours as needed for nausea. (Patient not taking: Reported on 04/27/2020)   . promethazine (PHENERGAN) 25 MG tablet Take 1 tablet (25 mg total) by mouth every 6 (six) hours as needed for nausea or vomiting. (Patient not taking: Reported on 04/27/2020) 03/03/2019: Took 1/2 tablet   . Zoledronic Acid (ZOMETA IV) Inject 1 Dose into the vein every 6 (six) months.  (Patient not taking: Reported on 04/27/2020) 04/27/2020: Last received 10/2019  . [DISCONTINUED] ferrous sulfate 325 (  65 FE) MG EC tablet Take 1 tablet (325 mg total) by mouth 2 (two) times daily. (Patient not taking: Reported on 04/14/2019)   . [DISCONTINUED] pantoprazole (PROTONIX) 40 MG tablet Take 1 tablet (40 mg total) by mouth 2 (two) times daily. (Patient not taking: Reported on 03/27/2020)    No facility-administered encounter medications on file as of 04/27/2020.   Allergies  Allergen Reactions  . Clindamycin/Lincomycin Diarrhea    Resulted in C-diff  . Other Nausea Only    Stronger pain meds  . Adhesive [Tape] Rash     Pt is sensitive to any kind of adhesive tape products.    ROS: The patient denies anorexia, fever, headaches, decreased hearing, ear pain, sore throat, breast concerns, chest pain, palpitations, dizziness, syncope, dyspnea on exertion, cough, swelling, nausea, vomiting, diarrhea, constipation, abdominal pain, melena, hematochezia (rare), indigestion/heartburn, hematuria, incontinence, dysuria, vaginal bleeding, discharge, odor or itch, genital lesions, joint pains, numbness, tingling), weakness, tremor, suspicious skin lesions, depression, anxiety, abnormal bleeding/bruising, or enlarged lymph nodes. No further GI problems. Vaginal dryness with intercourse, controlled with lubricants Some balance problems since her fall and skull fracture, gradually improving. No headaches.  Starting to smell things again, and taste is slowly improving. 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  Uses orthotics. Nocturia 3-4x (related to fluids at night).   PHYSICAL EXAM:  BP 104/62   Pulse 65   Temp (!) 97.2 F (36.2 C)   Ht 5' 6.5" (1.689 m)   Wt 129 lb 3.2 oz (58.6 kg)   SpO2 96%   BMI 20.54 kg/m   Wt Readings from Last 3 Encounters:  04/27/20 129 lb 3.2 oz (58.6 kg)  04/04/20 128 lb 3.2 oz (58.2 kg)  03/02/20 129 lb 12.8 oz (58.9 kg)    General Appearance:   Alert, cooperative, no distress, appears stated age, in good spirits.  Head:   Normocephalic, without obvious abnormality, atraumatic  Eyes:   PERRL, conjunctiva/corneas clear, EOM's intact, fundi benign  Ears:   Normal TM's and external ear canals.   Nose:  Not examined (wearing mask due to COVID-19 pandemic)  Throat:  Not examined (wearing mask due to COVID-19 pandemic)  Neck:  Supple, no lymphadenopathy; thyroid: noenlargement/ tenderness/nodules; no carotid bruit or JVD  Back:  Spine nontender, no curvature, ROM normal, no CVA tenderness  Lungs:   Clear to auscultation bilaterally without  wheezes, rales orronchi; respirations unlabored  Chest Wall:   No tenderness or deformity  Heart:   Regular rate and rhythm, S1 and S2 normal, no murmur, rub  or gallop  Breast Exam:   No tenderness, nipple discharge or inversion. No axillary lymphadenopathy. WHSS at left lateral breast. No masses.  Abdomen:   Soft, non-tender, nondistended, normoactive bowel sounds,   no masses, no hepatosplenomegaly  Genitalia:   Normal external genitalia without lesions, mild atrophic changes. BUS and vagina normal; no cervical motion tenderness. No abnormal vaginal discharge. Pap not obtained. Uterus and adnexa not enlarged, nontender, no masses.  Rectal:   Normal tone, no masses or tenderness; heme negative stool  Extremities:  No clubbing, cyanosis or edema  Pulses:  2+ and symmetric all extremities  Skin:  Skin color, texture, turgor normal, no rashes or lesions.  Lymph nodes:  Cervical, supraclavicular, and axillary nodes normal  Neurologic:  CNII-XII intact, normal strength, sensation and gait; reflexes 2+ and symmetric throughout  Psych: Normal mood, affect, hygiene and grooming    ASSESSMENT/PLAN:  Welcome to Medicare preventive visit -  Plan: Lipid panel  Need for pneumococcal vaccination - Plan: Pneumococcal polysaccharide vaccine 23-valent greater than or equal to 2yo subcutaneous/IM  Bipolar disorder in full remission, most recent episode unspecified type [facility])  Other osteoporosis without current pathological fracture - Will check with Dr. Jana Hakim whether she is due for treatment now (and should get from his office), vs waiting for Sept DEXA - Plan: VITAMIN D 25 Hydroxy (Vit-D Deficiency, Fractures)  Postconcussive syndrome - cont with PT, neuro-ophtho referral as scheduled, and f/u with PMR as scheduled. slowly improving  History of breast cancer - Plan: CBC with Differential/Platelet, Comprehensive metabolic panel  History of  lymphoma - Plan: CBC with Differential/Platelet, Comprehensive metabolic panel  Screening for lipid disorders - Plan: Lipid panel  Balance problem - cont PT - Plan: TSH, VITAMIN D 25 Hydroxy (Vit-D Deficiency, Fractures), Vitamin B12  Medication monitoring encounter - Plan: CBC with Differential/Platelet, Comprehensive metabolic panel, Lipid panel, TSH, VITAMIN D 25 Hydroxy (Vit-D Deficiency, Fractures), Vitamin B12   CBC, C-met, B12, D (B12 and D rec by neuro) Lipids and TSH (last done 3 years ago)   Send Dr. Jana Hakim message asking about Zometa--due for dose now? Versus wait until 06/2020 DEXA (He discharged her back to PCP care.)   Discussed monthly self breast exams and yearly mammograms; at least 30 minutes of aerobic activity at least 5 days/week, weight-bearing exercise at least 2x/week; proper sunscreen use reviewed; healthy diet, including goals of calcium and vitamin D intake and alcohol recommendations (less than or equal to 1 drink/day) reviewed; regular seatbelt use; changing batteries in smoke detectors. Immunization recommendations discussed--continue yearly flu shots. Pneumovax given today. Colonoscopy recommendations reviewed--UTD.  Living Will and healthcare POA--asked to get [place] a copy for chart.  MOST form reviewed and completed, Full Code, Full Care.   Check with [name] to see if you have frequent long pauses in your breathing, or if it is just snoring that he notices.  If you tend to have pauses, and then snort/startle/gasp and wake up and continue to sleep, then I do think you need a sleep study. Not just for snoring.  Medicare Attestation I have personally reviewed: [name] medical and social history Their use of alcohol, tobacco or illicit drugs Their current medications and supplements [name] functional ability including ADLs,fall risks, home safety risks, cognitive, and hearing and visual impairment Diet and physical activities Evidence for  depression or mood disorders  [name] weight, height, BMI have been recorded in the chart.  I have made referrals, counseling, and provided education to the patient based on review of the above and I have provided the patient with a written personalized care plan for preventive services.

## 2020-04-27 ENCOUNTER — Encounter: Payer: Self-pay | Admitting: Family Medicine

## 2020-04-27 ENCOUNTER — Ambulatory Visit (INDEPENDENT_AMBULATORY_CARE_PROVIDER_SITE_OTHER): Payer: Medicare Other | Admitting: Family Medicine

## 2020-04-27 VITALS — BP 104/62 | HR 65 | Temp 97.2°F | Ht 66.5 in | Wt 129.2 lb

## 2020-04-27 DIAGNOSIS — Z0000 Encounter for general adult medical examination without abnormal findings: Secondary | ICD-10-CM

## 2020-04-27 DIAGNOSIS — F3170 Bipolar disorder, currently in remission, most recent episode unspecified: Secondary | ICD-10-CM

## 2020-04-27 DIAGNOSIS — F0781 Postconcussional syndrome: Secondary | ICD-10-CM | POA: Diagnosis not present

## 2020-04-27 DIAGNOSIS — Z23 Encounter for immunization: Secondary | ICD-10-CM

## 2020-04-27 DIAGNOSIS — Z853 Personal history of malignant neoplasm of breast: Secondary | ICD-10-CM | POA: Diagnosis not present

## 2020-04-27 DIAGNOSIS — R2689 Other abnormalities of gait and mobility: Secondary | ICD-10-CM

## 2020-04-27 DIAGNOSIS — Z8579 Personal history of other malignant neoplasms of lymphoid, hematopoietic and related tissues: Secondary | ICD-10-CM

## 2020-04-27 DIAGNOSIS — M818 Other osteoporosis without current pathological fracture: Secondary | ICD-10-CM

## 2020-04-27 DIAGNOSIS — Z5181 Encounter for therapeutic drug level monitoring: Secondary | ICD-10-CM

## 2020-04-27 DIAGNOSIS — Z13220 Encounter for screening for lipoid disorders: Secondary | ICD-10-CM

## 2020-04-28 LAB — CBC WITH DIFFERENTIAL/PLATELET
Basophils Absolute: 0 10*3/uL (ref 0.0–0.2)
Basos: 1 %
EOS (ABSOLUTE): 0.1 10*3/uL (ref 0.0–0.4)
Eos: 3 %
Hematocrit: 42.3 % (ref 34.0–46.6)
Hemoglobin: 13.4 g/dL (ref 11.1–15.9)
Immature Grans (Abs): 0 10*3/uL (ref 0.0–0.1)
Immature Granulocytes: 0 %
Lymphocytes Absolute: 0.5 10*3/uL — ABNORMAL LOW (ref 0.7–3.1)
Lymphs: 11 %
MCH: 31 pg (ref 26.6–33.0)
MCHC: 31.7 g/dL (ref 31.5–35.7)
MCV: 98 fL — ABNORMAL HIGH (ref 79–97)
Monocytes Absolute: 0.4 10*3/uL (ref 0.1–0.9)
Monocytes: 8 %
Neutrophils Absolute: 3.7 10*3/uL (ref 1.4–7.0)
Neutrophils: 77 %
Platelets: 181 10*3/uL (ref 150–450)
RBC: 4.32 x10E6/uL (ref 3.77–5.28)
RDW: 12.4 % (ref 11.7–15.4)
WBC: 4.7 10*3/uL (ref 3.4–10.8)

## 2020-04-28 LAB — COMPREHENSIVE METABOLIC PANEL
ALT: 20 IU/L (ref 0–32)
AST: 28 IU/L (ref 0–40)
Albumin/Globulin Ratio: 2.3 — ABNORMAL HIGH (ref 1.2–2.2)
Albumin: 5 g/dL — ABNORMAL HIGH (ref 3.8–4.8)
Alkaline Phosphatase: 54 IU/L (ref 48–121)
BUN/Creatinine Ratio: 17 (ref 12–28)
BUN: 15 mg/dL (ref 8–27)
Bilirubin Total: 0.6 mg/dL (ref 0.0–1.2)
CO2: 24 mmol/L (ref 20–29)
Calcium: 9.3 mg/dL (ref 8.7–10.3)
Chloride: 103 mmol/L (ref 96–106)
Creatinine, Ser: 0.9 mg/dL (ref 0.57–1.00)
GFR calc Af Amer: 78 mL/min/{1.73_m2} (ref >59)
GFR calc non Af Amer: 67 mL/min/{1.73_m2} (ref >59)
Globulin, Total: 2.2 g/dL (ref 1.5–4.5)
Glucose: 90 mg/dL (ref 65–99)
Potassium: 4.7 mmol/L (ref 3.5–5.2)
Sodium: 141 mmol/L (ref 134–144)
Total Protein: 7.2 g/dL (ref 6.0–8.5)

## 2020-04-28 LAB — LIPID PANEL
Chol/HDL Ratio: 2 ratio (ref 0.0–4.4)
Cholesterol, Total: 212 mg/dL — ABNORMAL HIGH (ref 100–199)
HDL: 106 mg/dL (ref >39)
LDL Chol Calc (NIH): 89 mg/dL (ref 0–99)
Triglycerides: 99 mg/dL (ref 0–149)
VLDL Cholesterol Cal: 17 mg/dL (ref 5–40)

## 2020-04-28 LAB — VITAMIN D 25 HYDROXY (VIT D DEFICIENCY, FRACTURES): Vit D, 25-Hydroxy: 48.8 ng/mL (ref 30.0–100.0)

## 2020-04-28 LAB — TSH: TSH: 5.27 u[IU]/mL — ABNORMAL HIGH (ref 0.450–4.500)

## 2020-04-28 LAB — VITAMIN B12: Vitamin B-12: 381 pg/mL (ref 232–1245)

## 2020-05-01 ENCOUNTER — Encounter: Payer: Self-pay | Admitting: Family Medicine

## 2020-05-02 ENCOUNTER — Encounter: Payer: Self-pay | Admitting: Physical Therapy

## 2020-05-02 ENCOUNTER — Ambulatory Visit: Payer: Medicare Other | Admitting: Physical Therapy

## 2020-05-02 ENCOUNTER — Other Ambulatory Visit: Payer: Self-pay

## 2020-05-02 DIAGNOSIS — R208 Other disturbances of skin sensation: Secondary | ICD-10-CM

## 2020-05-02 DIAGNOSIS — R42 Dizziness and giddiness: Secondary | ICD-10-CM | POA: Diagnosis not present

## 2020-05-02 DIAGNOSIS — R2689 Other abnormalities of gait and mobility: Secondary | ICD-10-CM

## 2020-05-03 NOTE — Therapy (Signed)
[facility] [address] [place], [place], [address] Phone: [phone]   Fax:  [phone]  Physical Therapy Treatment  Patient Details  Name: Rhonda Gardner MRN: [id number] Date of Birth: 1956-07-15 Referring Provider (PT): Delice Lesch, MD   Encounter Date: 05/02/2020   05/02/20 1319  PT Visits / Re-Eval  Visit Number 5  Number of Visits 17  Date for PT Re-Evaluation 06/25/20  Authorization  Authorization Type Medicare: PN every 10th visit.  Progress Note Due on Visit 10  PT Time Calculation  PT Start Time 1317  PT Stop Time 1400  PT Time Calculation (min) 43 min  PT - End of Session  Equipment Utilized During Treatment Gait belt  Activity Tolerance Patient tolerated treatment well  Behavior During Therapy WFL for tasks assessed/performed     Past Medical History:  Diagnosis Date  . Allergy 2006   chemical cauterization in spring 2006, and 2nd treatment with good results.(Dr.Shoemaker)  . Bipolar disorder ([place])   . Bleeding ulcer   . Blood transfusion without reported diagnosis 07/2017   Bleeding ulcer  . Breast cancer ([place]) 12/2014   invasive ductal carcinoma (LEFT)  . Breast cancer of upper-outer quadrant of left female breast ([place]) 12/22/2014  . Breast hematoma 12/19/2015   had left breast aspiration of 4cm hematoma-path indicates benign hematoma  . C. difficile colitis   . C. difficile diarrhea   . Depression   . 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 Osteoporosis 06/20/2016   T-2.5'@spine'  on Dexa ([facility])  . Plantar fasciitis   . PONV (postoperative nausea and vomiting)   . Symptomatic anemia 07/16/2017    Past Surgical History:  Procedure Laterality Date  . BIOPSY  07/03/2018   Procedure: BIOPSY;  Surgeon: Gatha Mayer, MD;  Location: [facility];  Service: Endoscopy;;  . BIOPSY  09/02/2018   Procedure: BIOPSY;  Surgeon: Gatha Mayer,  MD;  Location: [facility];  Service: Endoscopy;;  . BIOPSY  10/22/2018   Procedure: BIOPSY;  Surgeon: Milus Banister, MD;  Location: [facility];  Service: Endoscopy;;  . BREAST BIOPSY Bilateral    benign and a right stereotatic breast biopsy.  . COLONOSCOPY    . ESOPHAGOGASTRODUODENOSCOPY N/A 07/16/2017   Procedure: ESOPHAGOGASTRODUODENOSCOPY (EGD);  Surgeon: Ladene Artist, MD;  Location: [facility];  Service: Endoscopy;  Laterality: N/A;  . ESOPHAGOGASTRODUODENOSCOPY N/A 10/22/2018   Procedure: ESOPHAGOGASTRODUODENOSCOPY (EGD);  Surgeon: Milus Banister, MD;  Location: [facility];  Service: Endoscopy;  Laterality: N/A;  . ESOPHAGOGASTRODUODENOSCOPY (EGD) WITH PROPOFOL N/A 07/03/2018   Procedure: ESOPHAGOGASTRODUODENOSCOPY (EGD) WITH PROPOFOL;  Surgeon: Gatha Mayer, MD;  Location: [facility];  Service: Endoscopy;  Laterality: N/A;  . ESOPHAGOGASTRODUODENOSCOPY (EGD) WITH PROPOFOL N/A 09/02/2018   Procedure: ESOPHAGOGASTRODUODENOSCOPY (EGD) WITH PROPOFOL;  Surgeon: Gatha Mayer, MD;  Location: [facility];  Service: Endoscopy;  Laterality: N/A;  . EUS N/A 10/22/2018   Procedure: UPPER ENDOSCOPIC ULTRASOUND (EUS) RADIAL;  Surgeon: Milus Banister, MD;  Location: [facility];  Service: Endoscopy;  Laterality: N/A;  . fibroid excision  age 44   prolapsed fibroid removed  . FINE NEEDLE ASPIRATION  10/22/2018   Procedure: FINE NEEDLE ASPIRATION;  Surgeon: Milus Banister, MD;  Location: [facility];  Service: Endoscopy;;  . FOOT SURGERY Left 2014   Dr. Paulla Dolly  . FOOT SURGERY Right 08/2016   Dr. Paulla Dolly --Shortening Osteotomy with Fixation 1st Metatarsal   . MOUTH SURGERY  06/2017   [facility]   . PORT-A-CATH REMOVAL N/A 02/15/2016   Procedure: REMOVAL PORT-A-CATH;  Surgeon: Autumn Messing III, MD;  Location: [facility];  Service: General;  Laterality: N/A;  . RADIOACTIVE SEED GUIDED PARTIAL MASTECTOMY WITH AXILLARY SENTINEL LYMPH NODE BIOPSY Left 05/25/2015   Procedure: LEFT  BREAST LUMPECTOMY WITH RADIOACTIVE SEED AND SENTINEL LYMPH NODE BIOPSY;  Surgeon: Autumn Messing III, MD;  Location: [facility];  Service: General;  Laterality: Left;  . UPPER GASTROINTESTINAL ENDOSCOPY      There were no vitals filed for this visit.     05/02/20 1318  Symptoms/Limitations  Subjective No new complaints. Reports HEP is going well.  Pertinent History Hx of L breast CA (**no BP in LUE) s/p chemo/radiation, hx of lymphoma, foot surgery (bunions), depression, bipolar disorder, osteoporosis, plantar fasciitis, anemia  Patient Stated Goals For my head to clear up and for my balance to get where it was before.  Pain Assessment  Currently in Pain? No/denies      05/02/20 1321  Ambulation/Gait  Ambulation/Gait Yes  Ambulation/Gait Assistance 7: Independent  Ambulation/Gait Assistance Details 4/10 dizziness, 5/10 dikzziness with gait while scanning the enviroment and having conversation  Ambulation Distance (Feet) 1000 Feet  Assistive device None  Gait Pattern Step-through pattern;Decreased trunk rotation  Ambulation Surface Level;Unlevel;Indoor;Outdoor;Paved  Self-Care  Self-Care Other Self-Care Comments  Other Self-Care Comments  discussed and reviewed HEP issued to date. Pt reports no issues and that current program is still challenging.          05/02/20 1345  Balance Exercises: Standing  Wall Bumps Hip;Eyes opened;Eyes closed;5 reps;10 reps;Limitations  Wall Bumps Limitations standing across red foam beam: performed for 10 reps with EO, then with EC for 10 more reps. cues needed on ex form/technique and weight shifting.   Rockerboard Anterior/posterior;Lateral;Head turns;EO;EC;30 seconds;10 reps;Limitations  Rockerboard Limitations performed both ways on the balance board: rocking the board with emphasis on tall posture, movements at ankle and weight shifting with EO, progressing to EC; then holding the board steady with arms at sides with EC for 30 sec  intervals, then progressed to [facility] for head movements left<>right, up<>down. min guard to min assist for balance with UE touch to bars as needed for balance.    Balance Beam standing across red foam beam: alternating fwd stepping to floor/back onto beam, then alternating bwd stepping to floor/back onto beam for 10 reps each. Emphasis on step length, step height and weight shifting. min guard to min assist for balance.         PT Short Term Goals - 05/02/20 1320      PT SHORT TERM GOAL #1   Title Pt will be IND in initial HEP to improve dizziness and balance. TARGET DATE FOR ALL STGS:04/24/20    Baseline 05/02/20: met with current program    Status Achieved      PT SHORT TERM GOAL #2   Title Perform SOT and FGA and write STG/LTG as indicated.    Status Achieved      PT SHORT TERM GOAL #3   Title Pt will amb. 500' over even terrain, while performing head turns/nods, IND to improve functional mobility.    Time 4    Period Weeks    Status New             PT Long Term Goals - 04/26/20 0949      PT LONG TERM GOAL #1   Title Pt will report no falls  in the last four weeks to improve safety during functional mobility. TARGET DATE FOR LTGS: 05/22/20    Time 8    Period Weeks    Status New      PT LONG TERM GOAL #2   Title Pt will amb. 1000' over paved and uneven terrain, while performing head turns/nods, IND to improve safety during functional mobility.    Time 8    Period Weeks    Status New      PT LONG TERM GOAL #3   Title Pt will be IND in progressed HEP to improve dizziness and balance.    Time 8    Period Weeks    Status New      PT LONG TERM GOAL #4   Title Pt will improve FGA score to 30/30 to decr. falls risk.    Baseline 27/30    Time 8    Period Weeks    Status New      PT LONG TERM GOAL #5   Title Pt will demonstrate improved use of somatosensory information for standing balance as indicated on SOT by increase in [facility] to >95% on sensory analysis (will maintain  composite score of >77)    Baseline Sensory analysis for [facility]: 90%    Status New    Target Date 05/22/20             05/02/20 1320  Plan  Clinical Impression Statement Today's skilled session focused on progress toward remaining STGs with both goals met. Remainder of the session continued to focus on balance reactions with no issues noted or reported in session.  Personal Factors and Comorbidities Age;Comorbidity 3+;Past/Current Experience;Time since onset of injury/illness/exacerbation  Comorbidities Hx of L breast CA (**no BP in LUE) s/p chemo/radiation, hx of lymphoma, foot surgery (bunions), depression, bipolar disorder, osteoporosis, plantar fasciitis, anemia  Examination-Activity Limitations Bathing;Locomotion Level;Stairs;Transfers;Caring for Others (pt's sister will visit pt soon and she has to assist her sister to the bathroom and she's fearful of the steps)  Examination-Participation Restrictions Cleaning;Yard Work;Interpersonal Relationship  Pt will benefit from skilled therapeutic intervention in order to improve on the following deficits Abnormal gait;Dizziness;Decreased activity tolerance;Decreased knowledge of use of DME;Decreased balance;Decreased cognition;Decreased mobility;Decreased strength;Impaired sensation;Impaired flexibility  Stability/Clinical Decision Making Evolving/Moderate complexity  Rehab Potential Good  PT Frequency 2x / week  PT Duration 8 weeks  PT Treatment/Interventions ADLs/Self Care Home Management;Aquatic Therapy;Biofeedback;Canalith Repostioning;DME Instruction;Balance training;Neuromuscular re-education;Manual techniques;Therapeutic exercise;Therapeutic activities;Functional mobility training;Patient/family education;Stair training;Gait training;Cognitive remediation;Vestibular  PT Next Visit Plan **NO BP IN LUE**  Progress HEP as indicated.  Work on balance with EC - decreased use of somatosensory due to PN.  Limits of stability training.  PT Home  Exercise Plan x1 viewing and corner/counter balance [email or web address] in [employer]  Consulted and Agree with Plan of Care Patient          Patient will benefit from skilled therapeutic intervention in order to improve the following deficits and impairments:  Abnormal gait, Dizziness, Decreased activity tolerance, Decreased knowledge of use of DME, Decreased balance, Decreased cognition, Decreased mobility, Decreased strength, Impaired sensation, Impaired flexibility  Visit Diagnosis: Dizziness and giddiness  Other abnormalities of gait and mobility  Other disturbances of skin sensation     Problem List Patient Active Problem List   Diagnosis Date Noted  . Sleep disturbance 04/04/2020  . Post concussive syndrome 03/02/2020  . Dizziness and giddiness 03/02/2020  . Follicular lymphoma ([place]) 11/16/2018  . Duodenum disorder   . Upper gastrointestinal bleed 07/02/2018  .  Abdominal pain, epigastric 07/01/2018  . Elevated lipase 07/01/2018  . Elevated LFTs 07/01/2018  . Iron deficiency anemia 08/17/2017  . C. difficile colitis 08/17/2017  . Dehydration 08/17/2017  . Hypokalemia 08/17/2017  . Symptomatic anemia 07/16/2017  . Bipolar 1 disorder ([place]) 07/16/2017  . Hypotension due to hypovolemia 07/16/2017  . Melena   . Heme positive stool   . Chronic duodenal ulcer with hemorrhage   . Blood transfusion without reported diagnosis 07/14/2017  . Osteoporosis 07/29/2016  . Genetic counseling and testing 01/29/2016  . Pain in joint, pelvic region and thigh 06/12/2015  . 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 Allergy to adhesive tape 01/25/2015  . Malignant neoplasm of upper-outer quadrant of left breast in female, estrogen receptor negative ([place]) 12/22/2014    Willow Ora, PTA, [facility] [address] [place], [place] [address] [phone] 05/03/20, 10:04 PM    Name: Rhonda Gardner MRN: [id number] Date of Birth:  Aug 03, 1956

## 2020-05-04 ENCOUNTER — Encounter: Payer: BLUE CROSS/BLUE SHIELD | Admitting: Physical Therapy

## 2020-05-08 ENCOUNTER — Ambulatory Visit: Payer: Medicare Other | Admitting: Physical Therapy

## 2020-05-08 ENCOUNTER — Other Ambulatory Visit: Payer: Self-pay | Admitting: Family Medicine

## 2020-05-08 DIAGNOSIS — R7989 Other specified abnormal findings of blood chemistry: Secondary | ICD-10-CM

## 2020-05-08 DIAGNOSIS — E038 Other specified hypothyroidism: Secondary | ICD-10-CM

## 2020-05-08 NOTE — Progress Notes (Signed)
th

## 2020-05-09 ENCOUNTER — Ambulatory Visit: Payer: Medicare Other | Admitting: Physical Therapy

## 2020-05-11 ENCOUNTER — Encounter: Payer: BLUE CROSS/BLUE SHIELD | Admitting: Physical Therapy

## 2020-05-12 ENCOUNTER — Ambulatory Visit: Payer: Medicare Other | Admitting: Physical Therapy

## 2020-05-12 ENCOUNTER — Encounter: Payer: Self-pay | Admitting: Physical Therapy

## 2020-05-12 ENCOUNTER — Other Ambulatory Visit: Payer: Self-pay

## 2020-05-12 DIAGNOSIS — R2689 Other abnormalities of gait and mobility: Secondary | ICD-10-CM

## 2020-05-12 DIAGNOSIS — R42 Dizziness and giddiness: Secondary | ICD-10-CM | POA: Diagnosis not present

## 2020-05-12 DIAGNOSIS — R208 Other disturbances of skin sensation: Secondary | ICD-10-CM

## 2020-05-12 NOTE — Patient Instructions (Signed)
Access Code: [email or web address] URL: [email or web address] Date: 05/12/2020 Prepared by: Willow Ora  Exercises Walking with Head Rotation - 1 x daily - 5 x weekly - 1 sets - 4 reps Walking with Eyes Closed and Counter Support - 1 x daily - 5 x weekly - 1 sets - 4 reps Romberg Stance Eyes Closed on Foam Pad - 1 x daily - 5 x weekly - 1 sets - 3 reps - 30 hold Romberg Stance with Head Nods on Foam Pad - 1 x daily - 5 x weekly - 2 sets - 10 reps  Added the following to HEP today:  Wide Tandem Stance on Foam Pad with Eyes Closed - 1 x daily - 5 x weekly - 1 sets - 3 reps - 30 hold

## 2020-05-12 NOTE — Therapy (Signed)
[facility] [address] [place], [place], [address] Phone: [phone]   Fax:  [phone]  Physical Therapy Treatment  Patient Details  Name: Rhonda Gardner MRN: [id number] Date of Birth: Jan 14, 1956 Referring Provider (PT): Delice Lesch, MD   Encounter Date: 05/12/2020   PT End of Session - 05/12/20 1452    Visit Number 6    Number of Visits 17    Date for PT Re-Evaluation 06/25/20    Authorization Type Medicare: PN every 10th visit.    Progress Note Due on Visit 10    PT Start Time 1448    PT Stop Time 1532    PT Time Calculation (min) 44 min    Equipment Utilized During Treatment Gait belt    Activity Tolerance Patient tolerated treatment well    Behavior During Therapy WFL for tasks assessed/performed           Past Medical History:  Diagnosis Date  . Allergy 2006   chemical cauterization in spring 2006, and 2nd treatment with good results.(Dr.Shoemaker)  . Bipolar disorder ([place])   . Bleeding ulcer   . Blood transfusion without reported diagnosis 07/2017   Bleeding ulcer  . Breast cancer ([place]) 12/2014   invasive ductal carcinoma (LEFT)  . Breast cancer of upper-outer quadrant of left female breast ([place]) 12/22/2014  . Breast hematoma 12/19/2015   had left breast aspiration of 4cm hematoma-path indicates benign hematoma  . C. difficile colitis   . C. difficile diarrhea   . Depression   . 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 Osteoporosis 06/20/2016   T-2.5_0  on Dexa ([facility])  . Plantar fasciitis   . PONV (postoperative nausea and vomiting)   . Symptomatic anemia 07/16/2017    Past Surgical History:  Procedure Laterality Date  . BIOPSY  07/03/2018   Procedure: BIOPSY;  Surgeon: Gatha Mayer, MD;  Location: [facility];  Service: Endoscopy;;  . BIOPSY  09/02/2018   Procedure: BIOPSY;  Surgeon: Gatha Mayer, MD;  [facility]: [facility];   Service: Endoscopy;;  . BIOPSY  10/22/2018   Procedure: BIOPSY;  Surgeon: Milus Banister, MD;  [facility]: [facility];  Service: Endoscopy;;  . BREAST BIOPSY Bilateral    benign and a right stereotatic breast biopsy.  . COLONOSCOPY    . ESOPHAGOGASTRODUODENOSCOPY N/A 07/16/2017   Procedure: ESOPHAGOGASTRODUODENOSCOPY (EGD);  Surgeon: Ladene Artist, MD;  Location: [facility]OSCOPY;  Service: Endoscopy;  Laterality: N/A;  . ESOPHAGOGASTRODUODENOSCOPY N/A 10/22/2018   Procedure: ESOPHAGOGASTRODUODENOSCOPY (EGD);  Surgeon: Milus Banister, MD;  Location: [facility];  Service: Endoscopy;  Laterality: N/A;  . ESOPHAGOGASTRODUODENOSCOPY (EGD) WITH PROPOFOL N/A 07/03/2018   Procedure: ESOPHAGOGASTRODUODENOSCOPY (EGD) WITH PROPOFOL;  Surgeon: Gatha Mayer, MD;  [facility];  Service: Endoscopy;  Laterality: N/A;  . ESOPHAGOGASTRODUODENOSCOPY (EGD) WITH PROPOFOL N/A 09/02/2018   Procedure: ESOPHAGOGASTRODUODENOSCOPY (EGD) WITH PROPOFOL;  Surgeon: Gatha Mayer, MD;  [facility];  Service: Endoscopy;  Laterality: N/A;  . EUS N/A 10/22/2018   Procedure: UPPER ENDOSCOPIC ULTRASOUND (EUS) RADIAL;  Surgeon: Milus Banister, MD;  [facility];  Service: Endoscopy;  Laterality: N/A;  . fibroid excision  age 35   prolapsed fibroid removed  . FINE NEEDLE ASPIRATION  10/22/2018   Procedure: FINE NEEDLE ASPIRATION;  Surgeon: Milus Banister, MD;  [facility];  Service: Endoscopy;;  . FOOT SURGERY Left 2014   Dr. Paulla Dolly  . FOOT SURGERY Right 08/2016  Dr. Paulla Dolly --Shortening Osteotomy with Fixation 1st Metatarsal   . MOUTH SURGERY  06/2017   [facility]   . PORT-A-CATH REMOVAL N/A 02/15/2016   Procedure: REMOVAL PORT-A-CATH;  Surgeon: Autumn Messing III, MD;  Location: [facility];  Service: General;  Laterality: N/A;  . RADIOACTIVE SEED GUIDED PARTIAL MASTECTOMY WITH AXILLARY SENTINEL LYMPH NODE BIOPSY Left 05/25/2015   Procedure: LEFT BREAST LUMPECTOMY WITH  RADIOACTIVE SEED AND SENTINEL LYMPH NODE BIOPSY;  Surgeon: Autumn Messing III, MD;  Location: [facility]  Service: General;  Laterality: Left;  . UPPER GASTROINTESTINAL ENDOSCOPY      There were no vitals filed for this visit.   Subjective Assessment - 05/12/20 1450    Subjective Still with dizziness, 5/10. Worse when she closes her eyes, especially on the foam pad. No falls. Near fall with making the bed today while trying to tucking in mattress pad/sheets. Lost her footing and almost fell backwards. Was able to catch herself because she was holding onto the sheets.    Pertinent History Hx of L breast CA (**no BP in LUE) s/p chemo/radiation, hx of lymphoma, foot surgery (bunions), depression, bipolar disorder, osteoporosis, plantar fasciitis, anemia    Patient Stated Goals For my head to clear up and for my balance to get where it was before.    Currently in Pain? No/denies                 [facility] Adult PT Treatment/Exercise - 05/12/20 1454      Ambulation/Gait   Ambulation/Gait Yes    Ambulation/Gait Assistance 7: Independent    Ambulation/Gait Assistance Details around gym with session    Assistive device None    Gait Pattern Step-through pattern;Decreased trunk rotation    Ambulation Surface Level;Indoor      Neuro Re-ed    Neuro Re-ed Details  for balance/muscle re-ed: gait around track with busy ball while tracking the ball with head/eyes for 115 feet with the following movements- left<>right, up<>down, circles both ways and "t". no increase in dizziness reported with any of these laps.; blue mat over ramp, performed facing both up, then down the ramp: wide BOS for EC no head movements, progressing to EC head movements left<>right, then up<>donw with no symptoms, min guard assist. Progressed to narrow base of support for EC 30 sec's x 3 reps with min assist needed, increased sway noted. No increase in dizziness reported.                Balance Exercises -  05/12/20 1532      Balance Exercises: Standing   Standing Eyes Closed Wide (BOA);Head turns;Foam/compliant surface;30 secs;Limitations;Other reps (comment)    Standing Eyes Closed Limitations on open dense blue foam with no UE support with feet hip width apart- EC no head movements, progressing to EC head movements left<>right, up<>down. min guard to min assist due to increased ant/post sway.     SLS with Vectors Foam/compliant surface;Other reps (comment);Limitations    SLS with Vectors Limitations on balance board with 2 tall cones in front- alternating fwd foot taps, then alternating cross foot taps. cues on stance position and weight shifitng with no UE support, min guard assist.     Rockerboard Anterior/posterior;EO;EC;10 reps;Limitations    Rockerboard Limitations rocking the board with EO, progressing to EC with min guard to min assist, cues on posture.     Partial Tandem Stance Eyes closed;Foam/compliant surface;3 reps;20 secs;Limitations    Partial Tandem Stance Limitations on open dense blue  foam for 3 reps each foot forward, no UE support with min assist needed due to sway, cues on posture/weight shifitng.                PT Short Term Goals - 05/02/20 1320      PT SHORT TERM GOAL #1   Title Pt will be IND in initial HEP to improve dizziness and balance. TARGET DATE FOR ALL STGS:04/24/20    Baseline 05/02/20: met with current program    Status Achieved      PT SHORT TERM GOAL #2   Title Perform SOT and FGA and write STG/LTG as indicated.    Status Achieved      PT SHORT TERM GOAL #3   Title Pt will amb. 500' over even terrain, while performing head turns/nods, IND to improve functional mobility.    Baseline 05/02/20: met in session today    Time --    Period --    Status Achieved             PT Long Term Goals - 04/26/20 0949      PT LONG TERM GOAL #1   Title Pt will report no falls in the last four weeks to improve safety during functional mobility. TARGET DATE  FOR LTGS: 05/22/20    Time 8    Period Weeks    Status New      PT LONG TERM GOAL #2   Title Pt will amb. 1000' over paved and uneven terrain, while performing head turns/nods, IND to improve safety during functional mobility.    Time 8    Period Weeks    Status New      PT LONG TERM GOAL #3   Title Pt will be IND in progressed HEP to improve dizziness and balance.    Time 8    Period Weeks    Status New      PT LONG TERM GOAL #4   Title Pt will improve FGA score to 30/30 to decr. falls risk.    Baseline 27/30    Time 8    Period Weeks    Status New      PT LONG TERM GOAL #5   Title Pt will demonstrate improved use of somatosensory information for standing balance as indicated on SOT by increase in [facility] to >95% on sensory analysis (will maintain composite score of >77)    Baseline Sensory analysis for [facility]: 90%    Status New    Target Date 05/22/20                 Plan - 05/12/20 1453    Clinical Impression Statement Today's skilled session continued to focus on increased vestibular imput and high level balance activities. Pt continues to be most challenged with complaint surface with vision removed. No issues reported with dynamic gait ex's today. The pt is progressing and should benefit from continued PT to progress toward unmet goals.    Personal Factors and Comorbidities Age;Comorbidity 3+;Past/Current Experience;Time since onset of injury/illness/exacerbation    Comorbidities Hx of L breast CA (**no BP in LUE) s/p chemo/radiation, hx of lymphoma, foot surgery (bunions), depression, bipolar disorder, osteoporosis, plantar fasciitis, anemia    Examination-Activity Limitations Bathing;Locomotion Level;Stairs;Transfers;Caring for Others   pt's sister will visit pt soon and she has to assist her sister to the bathroom and she's fearful of the steps   Examination-Participation Restrictions Cleaning;Yard Work;Interpersonal Relationship    Stability/Clinical Decision Making  Evolving/Moderate complexity  Rehab Potential Good    PT Frequency 2x / week    PT Duration 8 weeks    PT Treatment/Interventions ADLs/Self Care Home Management;Aquatic Therapy;Biofeedback;Canalith Repostioning;DME Instruction;Balance training;Neuromuscular re-education;Manual techniques;Therapeutic exercise;Therapeutic activities;Functional mobility training;Patient/family education;Stair training;Gait training;Cognitive remediation;Vestibular    PT Next Visit Plan **NO BP IN LUE**  Progress HEP as indicated.  Work on balance with EC - decreased use of somatosensory due to PN.  Limits of stability training.    PT Home Exercise Plan x1 viewing and corner/counter balance [id number] in [employer]    Consulted and Agree with Plan of Care Patient           Patient will benefit from skilled therapeutic intervention in order to improve the following deficits and impairments:  Abnormal gait, Dizziness, Decreased activity tolerance, Decreased knowledge of use of DME, Decreased balance, Decreased cognition, Decreased mobility, Decreased strength, Impaired sensation, Impaired flexibility  Visit Diagnosis: Other abnormalities of gait and mobility  Dizziness and giddiness  Other disturbances of skin sensation     Problem List Patient Active Problem List   Diagnosis Date Noted  . Sleep disturbance 04/04/2020  . Post concussive syndrome 03/02/2020  . Dizziness and giddiness 03/02/2020  . Follicular lymphoma ([place]) 11/16/2018  . Duodenum disorder   . Upper gastrointestinal bleed 07/02/2018  . Abdominal pain, epigastric 07/01/2018  . Elevated lipase 07/01/2018  . Elevated LFTs 07/01/2018  . Iron deficiency anemia 08/17/2017  . C. difficile colitis 08/17/2017  . Dehydration 08/17/2017  . Hypokalemia 08/17/2017  . Symptomatic anemia 07/16/2017  . Bipolar 1 disorder ([place]) 07/16/2017  . Hypotension due to hypovolemia 07/16/2017  . Melena   . Heme positive stool   . Chronic duodenal ulcer  with hemorrhage   . Blood transfusion without reported diagnosis 07/14/2017  . Osteoporosis 07/29/2016  . Genetic counseling and testing 01/29/2016  . Pain in joint, pelvic region and thigh 06/12/2015  . 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 Allergy to adhesive tape 01/25/2015  . Malignant neoplasm of upper-outer quadrant of left breast in female, estrogen receptor negative ([place]) 12/22/2014   Willow Ora, PTA, [facility] [address], [place], [place] [address] [phone] 05/12/20, 4:59 PM   Name: Rhonda Gardner MRN: [id number] Date of Birth: 1956/07/22

## 2020-05-13 ENCOUNTER — Other Ambulatory Visit: Payer: Self-pay | Admitting: Family Medicine

## 2020-05-13 DIAGNOSIS — F3170 Bipolar disorder, currently in remission, most recent episode unspecified: Secondary | ICD-10-CM

## 2020-05-15 ENCOUNTER — Encounter: Payer: Self-pay | Admitting: Physical Therapy

## 2020-05-15 ENCOUNTER — Other Ambulatory Visit: Payer: Self-pay

## 2020-05-15 ENCOUNTER — Ambulatory Visit: Payer: Medicare Other | Attending: Physical Medicine & Rehabilitation | Admitting: Physical Therapy

## 2020-05-15 VITALS — BP 110/74

## 2020-05-15 DIAGNOSIS — R42 Dizziness and giddiness: Secondary | ICD-10-CM | POA: Insufficient documentation

## 2020-05-15 DIAGNOSIS — R209 Unspecified disturbances of skin sensation: Secondary | ICD-10-CM | POA: Diagnosis present

## 2020-05-15 DIAGNOSIS — R2689 Other abnormalities of gait and mobility: Secondary | ICD-10-CM | POA: Insufficient documentation

## 2020-05-15 DIAGNOSIS — R2681 Unsteadiness on feet: Secondary | ICD-10-CM | POA: Insufficient documentation

## 2020-05-15 DIAGNOSIS — R208 Other disturbances of skin sensation: Secondary | ICD-10-CM

## 2020-05-15 NOTE — Telephone Encounter (Signed)
[facility] is requesting to fill pt lamictal. Please advise [facility]

## 2020-05-16 ENCOUNTER — Encounter: Payer: Self-pay | Admitting: Family Medicine

## 2020-05-16 ENCOUNTER — Encounter: Payer: Medicare Other | Attending: Physical Medicine & Rehabilitation | Admitting: Physical Medicine & Rehabilitation

## 2020-05-16 ENCOUNTER — Other Ambulatory Visit: Payer: Self-pay

## 2020-05-16 ENCOUNTER — Encounter: Payer: Self-pay | Admitting: Physical Medicine & Rehabilitation

## 2020-05-16 VITALS — BP 103/65 | HR 82 | Temp 98.6°F | Ht 66.5 in | Wt 131.0 lb

## 2020-05-16 DIAGNOSIS — R42 Dizziness and giddiness: Secondary | ICD-10-CM | POA: Diagnosis present

## 2020-05-16 DIAGNOSIS — C8298 Follicular lymphoma, unspecified, lymph nodes of multiple sites: Secondary | ICD-10-CM | POA: Insufficient documentation

## 2020-05-16 DIAGNOSIS — G620 Drug-induced polyneuropathy: Secondary | ICD-10-CM | POA: Insufficient documentation

## 2020-05-16 DIAGNOSIS — G479 Sleep disorder, unspecified: Secondary | ICD-10-CM | POA: Insufficient documentation

## 2020-05-16 DIAGNOSIS — R269 Unspecified abnormalities of gait and mobility: Secondary | ICD-10-CM | POA: Diagnosis present

## 2020-05-16 DIAGNOSIS — F319 Bipolar disorder, unspecified: Secondary | ICD-10-CM | POA: Diagnosis present

## 2020-05-16 DIAGNOSIS — H539 Unspecified visual disturbance: Secondary | ICD-10-CM | POA: Diagnosis present

## 2020-05-16 DIAGNOSIS — R4189 Other symptoms and signs involving cognitive functions and awareness: Secondary | ICD-10-CM | POA: Diagnosis not present

## 2020-05-16 DIAGNOSIS — T451X5A Adverse effect of antineoplastic and immunosuppressive drugs, initial encounter: Secondary | ICD-10-CM | POA: Insufficient documentation

## 2020-05-16 DIAGNOSIS — F0781 Postconcussional syndrome: Secondary | ICD-10-CM

## 2020-05-16 DIAGNOSIS — F1010 Alcohol abuse, uncomplicated: Secondary | ICD-10-CM

## 2020-05-16 NOTE — Progress Notes (Addendum)
Subjective:    Patient ID: Rhonda Gardner, female    DOB: 1956-03-15, 64 y.o.   MRN: [id number]  HPI Female with pmh/psh of foot surgery of lymphoma s/p chemo/radiation, breast CA, depression, bipolar disorder, fall presents with post concussive syndrome.  Initially stated: Husband supplements history. Main complaints, dizziness, balance deficits, and aphasia.  Started in 03/03/19 after a fall.  She had head CT showing left frontal skull fracture into the left frontal sinus with posterior scalp hematoma.  Symptoms have been stable. Lack of sleep exacerbates symptoms.  No alleviating factors identified.  She denies time.  Symptoms seem to be worse in evenings/night. Associated changes in taste and smell have had some improvement. She tried sniff sticks with ?benefit. She is on Lamictal for ~20 years. She does note balance and visual deficits started after Chemo.  Husband also notes alcohol use with consumption of a couple of drinks a day, however patient states she drinks a couple of drinks a week.  Denies falls since trauma. Issues limit ADLs.   Last clinic 04/04/20.  Since that time, pt states she saw her PCP regarding lab work, but does not recall. She saw Neurooptometry, states only abnormality was astigmatism, later noted Neuroophto.  She is still in PT with some improvement. She did not try Melatonin. She has not followed up on sleep study.  She is drinking less fluids at nights.  She has decreased Etoh intake. Sleep is "fine".  Balance deficits mainly with eyes closed.   Pain Inventory Average Pain 0 Pain Right Now 0 My pain is No pain.  In the last 24 hours, has pain interfered with the following? General activity 0 Relation with others 0 Enjoyment of life 0 What TIME of day is your pain at its worst? No pain. Sleep (in general) Good  Pain is worse with: na Pain improves with: na Relief from Meds: na  Mobility how many minutes can you walk? indefintely ability to climb  steps?  yes do you drive?  yes  Function retired  Neuro/Psych trouble walking dizziness  Prior Studies Any changes since last visit?  no  Physicians involved in your care Any changes since last visit?  no PT   Family History  Problem Relation Age of Onset  . Heart disease Father   . Skin cancer Father        non melanoma - farmer  . Arthritis Mother        osteoarthritis  . Macular degeneration Mother        wet and dry  . Colon cancer Mother 16       colon cancer at age 70  . Hypertension Mother        resolved with weight loss  . Osteoporosis Sister        osteopenia (states it resolved)  . Lung cancer Maternal Aunt        lung cancer  . Cancer Paternal Aunt 34       ? stomach cancer  . Mental retardation Sister   . Blindness Sister        secondary to retrolental fibroplasia  . Osteoporosis Sister   . Goiter Paternal Aunt   . Diabetes Paternal Aunt   . Esophageal cancer Neg Hx   . Rectal cancer Neg Hx   . Stomach cancer Neg Hx    Social History   Socioeconomic History  . Marital status: Married    Spouse name: Not on file  . Number of children:  Not on file  . Years of education: Not on file  . Highest education level: Not on file  Occupational History  . Not on file  Tobacco Use  . Smoking status: Never Smoker  . Smokeless tobacco: Never Used  Vaping Use  . Vaping Use: Never used  Substance and Sexual Activity  . Alcohol use: Yes    Alcohol/week: 0.0 standard drinks    Comment: 3 glasses of wine/week; went up to 1-2/d during COVID, cut back  . Drug use: No  . Sexual activity: Yes    Partners: Male    Comment: postmenopausal  Other Topics Concern  . Not on file  Social History Narrative   Married Doctor, general practice) (1 dog (chocolate lab) passed away in 01/12/19)    Homemaker      Updated 04/2020   Social Determinants of Health   Financial Resource Strain:   . Difficulty of Paying Living Expenses:   Food Insecurity:   . Worried About Sales executive in the Last Year:   . Arboriculturist in the Last Year:   Transportation Needs:   . Film/video editor (Medical):   Marland Kitchen Lack of Transportation (Non-Medical):   Physical Activity:   . Days of Exercise per Week:   . Minutes of Exercise per Session:   Stress:   . Feeling of Stress :   Social Connections:   . Frequency of Communication with Friends and Family:   . Frequency of Social Gatherings with Friends and Family:   . Attends Religious Services:   . Active Member of Clubs or Organizations:   . Attends Archivist Meetings:   Marland Kitchen Marital Status:    Past Surgical History:  Procedure Laterality Date  . BIOPSY  07/03/2018   Procedure: BIOPSY;  Surgeon: Gatha Mayer, MD;  Location: [facility];  Service: Endoscopy;;  . BIOPSY  09/02/2018   Procedure: BIOPSY;  Surgeon: Gatha Mayer, MD;  [facility];  Service: Endoscopy;;  . BIOPSY  10/22/2018   Procedure: BIOPSY;  Surgeon: Milus Banister, MD;  [facility];  Service: Endoscopy;;  . BREAST BIOPSY Bilateral    benign and a right stereotatic breast biopsy.  . COLONOSCOPY    . ESOPHAGOGASTRODUODENOSCOPY N/A 07/16/2017   Procedure: ESOPHAGOGASTRODUODENOSCOPY (EGD);  Surgeon: Ladene Artist, MD;  Location: [facility] ENDOSCOPY;  Service: Endoscopy;  Laterality: N/A;  . ESOPHAGOGASTRODUODENOSCOPY N/A 10/22/2018   Procedure: ESOPHAGOGASTRODUODENOSCOPY (EGD);  Surgeon: Milus Banister, MD;  Location: [facility];  Service: Endoscopy;  Laterality: N/A;  . ESOPHAGOGASTRODUODENOSCOPY (EGD) WITH PROPOFOL N/A 07/03/2018   Procedure: ESOPHAGOGASTRODUODENOSCOPY (EGD) WITH PROPOFOL;  Surgeon: Gatha Mayer, MD;  [facility];  Service: Endoscopy;  Laterality: N/A;  . ESOPHAGOGASTRODUODENOSCOPY (EGD) WITH PROPOFOL N/A 09/02/2018   Procedure: ESOPHAGOGASTRODUODENOSCOPY (EGD) WITH PROPOFOL;  Surgeon: Gatha Mayer, MD;  [facility];  Service: Endoscopy;  Laterality: N/A;  . EUS N/A 10/22/2018    Procedure: UPPER ENDOSCOPIC ULTRASOUND (EUS) RADIAL;  Surgeon: Milus Banister, MD;  [facility];  Service: Endoscopy;  Laterality: N/A;  . fibroid excision  age 64   prolapsed fibroid removed  . FINE NEEDLE ASPIRATION  10/22/2018   Procedure: FINE NEEDLE ASPIRATION;  Surgeon: Milus Banister, MD;  [facility];  Service: Endoscopy;;  . FOOT SURGERY Left 2013-01-11   Dr. Paulla Dolly  . FOOT SURGERY Right 08/2016   Dr. Paulla Dolly --Shortening Osteotomy with Fixation 1st Metatarsal   . MOUTH SURGERY  06/2017  [facility]   . PORT-A-CATH REMOVAL N/A 02/15/2016   Procedure: REMOVAL PORT-A-CATH;  Surgeon: Autumn Messing III, MD;  Location: [facility];  Service: General;  Laterality: N/A;  . RADIOACTIVE SEED GUIDED PARTIAL MASTECTOMY WITH AXILLARY SENTINEL LYMPH NODE BIOPSY Left 05/25/2015   Procedure: LEFT BREAST LUMPECTOMY WITH RADIOACTIVE SEED AND SENTINEL LYMPH NODE BIOPSY;  Surgeon: Autumn Messing III, MD;  Location: [facility];  Service: General;  Laterality: Left;  . UPPER GASTROINTESTINAL ENDOSCOPY     Past Medical History:  Diagnosis Date  . Allergy 2006   chemical cauterization in spring 2006, and 2nd treatment with good results.(Dr.Shoemaker)  . Bipolar disorder ([place])   . Bleeding ulcer   . Blood transfusion without reported diagnosis 07/2017   Bleeding ulcer  . Breast cancer ([place]) 12/2014   invasive ductal carcinoma (LEFT)  . Breast cancer of upper-outer quadrant of left female breast ([place]) 12/22/2014  . Breast hematoma 12/19/2015   had left breast aspiration of 4cm hematoma-path indicates benign hematoma  . C. difficile colitis   . C. difficile diarrhea   . Depression   . 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 Osteoporosis 06/20/2016   T-2.5@spine  on Dexa ([facility])  . Plantar fasciitis   . PONV (postoperative nausea and vomiting)   . Symptomatic anemia 07/16/2017   BP 103/65   Pulse 82   Temp 98.6 F (37 C)   Ht  5' 6.5" (1.689 m)   Wt 131 lb (59.4 kg)   SpO2 98%   BMI 20.83 kg/m   Opioid Risk Score:   Fall Risk Score:  `1  Depression screen PHQ 2/9  Depression screen [facility] 2/9 04/27/2020 04/04/2020 04/14/2019 04/13/2018 04/10/2017 04/08/2016 07/04/2015  Decreased Interest 0 0 0 0 0 0 0  Down, Depressed, Hopeless 0 0 0 0 0 0 0  PHQ - 2 Score 0 0 0 0 0 0 0  Altered sleeping - 0 - - - - -  Tired, decreased energy - 0 - - - - -  Change in appetite - 0 - - - - -  Feeling bad or failure about yourself  - 0 - - - - -  Trouble concentrating - 0 - - - - -  Moving slowly or fidgety/restless - 0 - - - - -  Suicidal thoughts - 0 - - - - -  PHQ-9 Score - 0 - - - - -  Some recent data might be hidden    Review of Systems  Constitutional: Positive for unexpected weight change.  HENT: Negative.        Changes in taste and smell  Eyes:       Double vision  Respiratory: Negative.   Cardiovascular: Negative.   Gastrointestinal: Negative.   Genitourinary: Negative.   Musculoskeletal: Positive for gait problem.  Skin: Negative.   Allergic/Immunologic: Negative.   Neurological: Positive for dizziness. Negative for speech difficulty and numbness.  Hematological: Negative.   Psychiatric/Behavioral: Negative.        Deficits in processing  All other systems reviewed and are negative.     Objective:   Physical Exam Constitutional: NAD. Vital signs reviewed. HENT: Normocephalic. Atraumatic. Eyes: EOMI. No discharge. Cardiovascular: No JVD. Respiratory: Normal effort.  No stridor. GI: Non-distended. Skin: Warm and dry.  Intact.Marland Kitchen Psych: Normal mood.  Normal behavior. Musc: No edema in extremities.  No tenderness in extremities. Gait: WNL Neuro: Alert and oriented Motor: B/l UE 5/5  Assessment & Plan:  Female with pmh/psh of foot surgery of lymphoma s/p chemo/radiation, breast CA, depression, bipolar disorder, fall presents with post concussive syndrome.   1.  Higher level cognitive deficits               Multifactorial post concussive syndrome +/- sleep disturbance +/- lamictal +/- chemo induced peripheral neuroapthy +/- Etoh use +/- age related changes             Main deficits of dizziness, balance deficits, aphasia, visual deficits  - stable               Started in 03/03/19 after a fall             CT head posterior scalp hematoma and frontal skull and sinus fracture             Continue PT              Patient states main goal is improve symptoms             PCP performed Vit B12, D labs, pt to obtain results             Referral to Neurooptometrist, she saw Ophto, awaiting follow upw with optometry              Will consider change in lamictal, if necessary  Discussed prognosis with husband and patient previously   2. Gait abnormality             Continue follow up with PT             Will consider cane if necessary  3. Sleep disturbance             Will consider Melatonin 1.5mg  (groggy with higher doses), patient does not feel she needs at present             Encouraged follow up with PCP for sleep study, pt is going to consider, not considering at present             Encouraged sleep hygiene - she is decreasing fluid intake at night, improving  4.  Alcohol use             Continue to decrease alcohol consumption, improving

## 2020-05-16 NOTE — Therapy (Signed)
[facility] [address] [place], [place], [address] Phone: [phone]   Fax:  [phone]  Physical Therapy Treatment  Patient Details  Name: Rhonda Gardner MRN: [id number] Date of Birth: 1956-08-10 Referring Provider (PT): Delice Lesch, MD   Encounter Date: 05/15/2020   PT End of Session - 05/15/20 1022    Visit Number 7    Number of Visits 17    Date for PT Re-Evaluation 06/25/20    Authorization Type Medicare: PN every 10th visit.    Progress Note Due on Visit 10    PT Start Time 1018    PT Stop Time 1100    PT Time Calculation (min) 42 min    Equipment Utilized During Treatment Gait belt    Activity Tolerance Patient tolerated treatment well    Behavior During Therapy WFL for tasks assessed/performed           Past Medical History:  Diagnosis Date  . Allergy 2006   chemical cauterization in spring 2006, and 2nd treatment with good results.(Dr.Shoemaker)  . Bipolar disorder ([place])   . Bleeding ulcer   . Blood transfusion without reported diagnosis 07/2017   Bleeding ulcer  . Breast cancer ([place]) 12/2014   invasive ductal carcinoma (LEFT)  . Breast cancer of upper-outer quadrant of left female breast ([place]) 12/22/2014  . Breast hematoma 12/19/2015   had left breast aspiration of 4cm hematoma-path indicates benign hematoma  . C. difficile colitis   . C. difficile diarrhea   . Depression   . 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 Osteoporosis 06/20/2016   T-2.5'@spine'  on Dexa ([facility])  . Plantar fasciitis   . PONV (postoperative nausea and vomiting)   . Symptomatic anemia 07/16/2017    Past Surgical History:  Procedure Laterality Date  . BIOPSY  07/03/2018   Procedure: BIOPSY;  Surgeon: Gatha Mayer, MD;  Location: [facility];  Service: Endoscopy;;  . BIOPSY  09/02/2018   Procedure: BIOPSY;  Surgeon: Gatha Mayer, MD;  Location: [facility];   Service: Endoscopy;;  . BIOPSY  10/22/2018   Procedure: BIOPSY;  Surgeon: Milus Banister, MD;  [facility]: [facility];  Service: Endoscopy;;  . BREAST BIOPSY Bilateral    benign and a right stereotatic breast biopsy.  . COLONOSCOPY    . ESOPHAGOGASTRODUODENOSCOPY N/A 07/16/2017   Procedure: ESOPHAGOGASTRODUODENOSCOPY (EGD);  Surgeon: Ladene Artist, MD;  Location: [facility] ENDOSCOPY;  Service: Endoscopy;  Laterality: N/A;  . ESOPHAGOGASTRODUODENOSCOPY N/A 10/22/2018   Procedure: ESOPHAGOGASTRODUODENOSCOPY (EGD);  Surgeon: Milus Banister, MD;  Location: [facility];  Service: Endoscopy;  Laterality: N/A;  . ESOPHAGOGASTRODUODENOSCOPY (EGD) WITH PROPOFOL N/A 07/03/2018   Procedure: ESOPHAGOGASTRODUODENOSCOPY (EGD) WITH PROPOFOL;  Surgeon: Gatha Mayer, MD;  [facility];  Service: Endoscopy;  Laterality: N/A;  . ESOPHAGOGASTRODUODENOSCOPY (EGD) WITH PROPOFOL N/A 09/02/2018   Procedure: ESOPHAGOGASTRODUODENOSCOPY (EGD) WITH PROPOFOL;  Surgeon: Gatha Mayer, MD;  [facility];  Service: Endoscopy;  Laterality: N/A;  . EUS N/A 10/22/2018   Procedure: UPPER ENDOSCOPIC ULTRASOUND (EUS) RADIAL;  Surgeon: Milus Banister, MD;  [facility];  Service: Endoscopy;  Laterality: N/A;  . fibroid excision  age 64   prolapsed fibroid removed  . FINE NEEDLE ASPIRATION  10/22/2018   Procedure: FINE NEEDLE ASPIRATION;  Surgeon: Milus Banister, MD;  [facility];  Service: Endoscopy;;  . FOOT SURGERY Left 2014   Dr. Paulla Dolly  . FOOT SURGERY Right 08/2016  Dr. Paulla Dolly --Shortening Osteotomy with Fixation 1st Metatarsal   . MOUTH SURGERY  06/2017   [facility]   . PORT-A-CATH REMOVAL N/A 02/15/2016   Procedure: REMOVAL PORT-A-CATH;  Surgeon: Autumn Messing III, MD;  Location: [facility];  Service: General;  Laterality: N/A;  . RADIOACTIVE SEED GUIDED PARTIAL MASTECTOMY WITH AXILLARY SENTINEL LYMPH NODE BIOPSY Left 05/25/2015   Procedure: LEFT BREAST LUMPECTOMY WITH  RADIOACTIVE SEED AND SENTINEL LYMPH NODE BIOPSY;  Surgeon: Autumn Messing III, MD;  Location: [facility];  Service: General;  Laterality: Left;  . UPPER GASTROINTESTINAL ENDOSCOPY      Vitals:   05/15/20 1022  BP: 110/74     Subjective Assessment - 05/15/20 1020    Subjective No new complaints. Went to mountains over the weekend, dizziness did well. Having some dizziness today, 7/10 today.    Pertinent History Hx of L breast CA (**no BP in LUE) s/p chemo/radiation, hx of lymphoma, foot surgery (bunions), depression, bipolar disorder, osteoporosis, plantar fasciitis, anemia    Patient Stated Goals For my head to clear up and for my balance to get where it was before.    Currently in Pain? No/denies                [facility] Adult PT Treatment/Exercise - 05/15/20 1024      Ambulation/Gait   Ambulation/Gait Yes    Ambulation/Gait Assistance 6: Modified independent (Device/Increase time)    Ambulation/Gait Assistance Details scanning left or right randomly for a few feet each. once episode of increased dizziness with scannign to right on 1st one, no other issues with  remaining head movements    Ambulation Distance (Feet) 1000 Feet    Assistive device None    Gait Pattern Step-through pattern;Decreased trunk rotation    Ambulation Surface Level;Indoor;Unlevel;Outdoor               Balance Exercises - 05/15/20 1631      Balance Exercises: Standing   Wall Bumps Hip;Eyes opened;Eyes closed;10 reps;Limitations    Wall Bumps Limitations on blue foam beam with EO, progressing to [facility] for 10 reps each, min guard to min assist for balance.     Partial Tandem Stance Eyes closed;Foam/compliant surface;2 reps;30 secs;Limitations    Partial Tandem Stance Limitations on blue mat on level surface- 2 reps each foot forward with light to no UE support (pt lifting hands off surface as able). min guard assist for balance.     Other Standing Exercises blue mat over ramp: performed facing  both up, then down the ramp. alternating fwd stepping/back to stance position for 10 reps each with min guard to min assist. then with feet together for EC no head movements for 30 sec's for 3 reps, min guard to min assist for balance.                PT Short Term Goals - 05/02/20 1320      PT SHORT TERM GOAL #1   Title Pt will be IND in initial HEP to improve dizziness and balance. TARGET DATE FOR ALL STGS:04/24/20    Baseline 05/02/20: met with current program    Status Achieved      PT SHORT TERM GOAL #2   Title Perform SOT and FGA and write STG/LTG as indicated.    Status Achieved      PT SHORT TERM GOAL #3   Title Pt will amb. 500' over even terrain, while performing head turns/nods, IND to improve functional mobility.  Baseline 05/02/20: met in session today    Time --    Period --    Status Achieved             PT Long Term Goals - 04/26/20 0949      PT LONG TERM GOAL #1   Title Pt will report no falls in the last four weeks to improve safety during functional mobility. TARGET DATE FOR LTGS: 05/22/20    Time 8    Period Weeks    Status New      PT LONG TERM GOAL #2   Title Pt will amb. 1000' over paved and uneven terrain, while performing head turns/nods, IND to improve safety during functional mobility.    Time 8    Period Weeks    Status New      PT LONG TERM GOAL #3   Title Pt will be IND in progressed HEP to improve dizziness and balance.    Time 8    Period Weeks    Status New      PT LONG TERM GOAL #4   Title Pt will improve FGA score to 30/30 to decr. falls risk.    Baseline 27/30    Time 8    Period Weeks    Status New      PT LONG TERM GOAL #5   Title Pt will demonstrate improved use of somatosensory information for standing balance as indicated on SOT by increase in [facility] to >95% on sensory analysis (will maintain composite score of >77)    Baseline Sensory analysis for SOM: 90%    Status New    Target Date 05/22/20                  Plan - 05/15/20 1022    Clinical Impression Statement Today's skilled sesison continued to focus on tasks needing increased vestibular system imput for balance. Pt did report an occaional increase in dizziness to 7-8/10 that decreased to her baseline of 7/10 (as reported at start of session). The pt is progressing toward goals and should benefit from continued PT to progress toward unmet goals.    Personal Factors and Comorbidities Age;Comorbidity 3+;Past/Current Experience;Time since onset of injury/illness/exacerbation    Comorbidities Hx of L breast CA (**no BP in LUE) s/p chemo/radiation, hx of lymphoma, foot surgery (bunions), depression, bipolar disorder, osteoporosis, plantar fasciitis, anemia    Examination-Activity Limitations Bathing;Locomotion Level;Stairs;Transfers;Caring for Others   pt's sister will visit pt soon and she has to assist her sister to the bathroom and she's fearful of the steps   Examination-Participation Restrictions Cleaning;Yard Work;Interpersonal Relationship    Stability/Clinical Decision Making Evolving/Moderate complexity    Rehab Potential Good    PT Frequency 2x / week    PT Duration 8 weeks    PT Treatment/Interventions ADLs/Self Care Home Management;Aquatic Therapy;Biofeedback;Canalith Repostioning;DME Instruction;Balance training;Neuromuscular re-education;Manual techniques;Therapeutic exercise;Therapeutic activities;Functional mobility training;Patient/family education;Stair training;Gait training;Cognitive remediation;Vestibular    PT Next Visit Plan **NO BP IN LUE**  Progress HEP as indicated.  Work on balance with EC - decreased use of somatosensory due to neuropathy.  Limits of stability training.    PT Home Exercise Plan x1 viewing and corner/counter balance [id number] in Medbridge    Consulted and Agree with Plan of Care Patient           Patient will benefit from skilled therapeutic intervention in order to improve the following deficits and  impairments:  Abnormal gait, Dizziness, Decreased activity tolerance, Decreased knowledge of use of  DME, Decreased balance, Decreased cognition, Decreased mobility, Decreased strength, Impaired sensation, Impaired flexibility  Visit Diagnosis: Other abnormalities of gait and mobility  Dizziness and giddiness  Other disturbances of skin sensation     Problem List Patient Active Problem List   Diagnosis Date Noted  . Cognitive deficits 05/16/2020  . Visual disturbance 05/16/2020  . Sleep disturbance 04/04/2020  . Post concussive syndrome 03/02/2020  . Dizziness and giddiness 03/02/2020  . Follicular lymphoma ([place]) 11/16/2018  . Duodenum disorder   . Upper gastrointestinal bleed 07/02/2018  . Abdominal pain, epigastric 07/01/2018  . Elevated lipase 07/01/2018  . Elevated LFTs 07/01/2018  . Iron deficiency anemia 08/17/2017  . C. difficile colitis 08/17/2017  . Dehydration 08/17/2017  . Hypokalemia 08/17/2017  . Symptomatic anemia 07/16/2017  . Bipolar 1 disorder ([place]) 07/16/2017  . Hypotension due to hypovolemia 07/16/2017  . Melena   . Heme positive stool   . Chronic duodenal ulcer with hemorrhage   . Blood transfusion without reported diagnosis 07/14/2017  . Osteoporosis 07/29/2016  . Genetic counseling and testing 01/29/2016  . Pain in joint, pelvic region and thigh 06/12/2015  . 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 Allergy to adhesive tape 01/25/2015  . Malignant neoplasm of upper-outer quadrant of left breast in female, estrogen receptor negative ([place]) 12/22/2014    Willow Ora, PTA, [employer] [address] [place], [place] [address] [phone] 05/16/20, 4:36 PM    Name: Malini Flemings Gardner MRN: [id number] Date of Birth: September 11, 1956

## 2020-05-17 ENCOUNTER — Ambulatory Visit: Payer: Medicare Other | Admitting: Physical Therapy

## 2020-05-17 DIAGNOSIS — R2689 Other abnormalities of gait and mobility: Secondary | ICD-10-CM | POA: Diagnosis not present

## 2020-05-17 DIAGNOSIS — R208 Other disturbances of skin sensation: Secondary | ICD-10-CM

## 2020-05-17 DIAGNOSIS — R42 Dizziness and giddiness: Secondary | ICD-10-CM

## 2020-05-17 NOTE — Therapy (Signed)
[facility] [address] [place], [place], [address] Phone: [phone]   Fax:  [phone]  Physical Therapy Treatment  Patient Details  Name: Rhonda Gardner MRN: [id number] Date of Birth: 1956/07/15 Referring Provider (PT): Delice Lesch, MD   Encounter Date: 05/17/2020   PT End of Session - 05/17/20 1023    Visit Number 8    Number of Visits 17    Date for PT Re-Evaluation 05/25/20   date corrected due to 8 week POC   Authorization Type Medicare: PN every 10th visit.    Progress Note Due on Visit 10    PT Start Time [phone]    PT Stop Time 1020    PT Time Calculation (min) 39 min    Activity Tolerance Patient tolerated treatment well    Behavior During Therapy [facility] for tasks assessed/performed           Past Medical History:  Diagnosis Date   Allergy 2006   chemical cauterization in spring 2006, and 2nd treatment with good results.(Dr.Shoemaker)   Bipolar disorder ([place])    Bleeding ulcer    Blood transfusion without reported diagnosis 07/2017   Bleeding ulcer   Breast cancer ([place]) 12/2014   invasive ductal carcinoma (LEFT)   Breast cancer of upper-outer quadrant of left female breast ([place]) 12/22/2014   Breast hematoma 12/19/2015   had left breast aspiration of 4cm hematoma-path indicates benign hematoma   C. difficile colitis    C. 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, treated at [facility] (radiation)   Osteoporosis 06/20/2016   T-2.5'@spine'  on Dexa ([facility])   Plantar fasciitis    PONV (postoperative nausea and vomiting)    Symptomatic anemia 07/16/2017    Past Surgical History:  Procedure Laterality Date   BIOPSY  07/03/2018   Procedure: BIOPSY;  Surgeon: Gatha Mayer, MD;  [facility];  Service: Endoscopy;;   BIOPSY  09/02/2018   Procedure: BIOPSY;  Surgeon: Gatha Mayer, MD;  [facility];  Service: Endoscopy;;    BIOPSY  10/22/2018   Procedure: BIOPSY;  Surgeon: Milus Banister, MD;  [facility];  Service: Endoscopy;;   BREAST BIOPSY Bilateral    benign and a right stereotatic breast biopsy.   COLONOSCOPY     ESOPHAGOGASTRODUODENOSCOPY N/A 07/16/2017   Procedure: ESOPHAGOGASTRODUODENOSCOPY (EGD);  Surgeon: Ladene Artist, MD;  Location: [facility] ENDOSCOPY;  Service: Endoscopy;  Laterality: N/A;   ESOPHAGOGASTRODUODENOSCOPY N/A 10/22/2018   Procedure: ESOPHAGOGASTRODUODENOSCOPY (EGD);  Surgeon: Milus Banister, MD;  Location: [facility];  Service: Endoscopy;  Laterality: N/A;   ESOPHAGOGASTRODUODENOSCOPY (EGD) WITH PROPOFOL N/A 07/03/2018   Procedure: ESOPHAGOGASTRODUODENOSCOPY (EGD) WITH PROPOFOL;  Surgeon: Gatha Mayer, MD;  [facility];  Service: Endoscopy;  Laterality: N/A;   ESOPHAGOGASTRODUODENOSCOPY (EGD) WITH PROPOFOL N/A 09/02/2018   Procedure: ESOPHAGOGASTRODUODENOSCOPY (EGD) WITH PROPOFOL;  Surgeon: Gatha Mayer, MD;  [facility];  Service: Endoscopy;  Laterality: N/A;   EUS N/A 10/22/2018   Procedure: UPPER ENDOSCOPIC ULTRASOUND (EUS) RADIAL;  Surgeon: Milus Banister, MD;  [facility];  Service: Endoscopy;  Laterality: N/A;   fibroid excision  age 73   prolapsed fibroid removed   FINE NEEDLE ASPIRATION  10/22/2018   Procedure: FINE NEEDLE ASPIRATION;  Surgeon: Milus Banister, MD;  [facility];  Service: Endoscopy;;   FOOT SURGERY Left 2014   Dr. Paulla Dolly   FOOT SURGERY Right 08/2016  Dr. Paulla Dolly --Shortening Osteotomy with Fixation 1st Metatarsal    MOUTH SURGERY  06/2017   [facility]    PORT-A-CATH REMOVAL N/A 02/15/2016   Procedure: REMOVAL PORT-A-CATH;  Surgeon: Autumn Messing III, MD;  Location: [facility];  Service: General;  Laterality: N/A;   RADIOACTIVE SEED GUIDED PARTIAL MASTECTOMY WITH AXILLARY SENTINEL LYMPH NODE BIOPSY Left 05/25/2015   Procedure: LEFT BREAST LUMPECTOMY WITH RADIOACTIVE SEED AND SENTINEL  LYMPH NODE BIOPSY;  Surgeon: Autumn Messing III, MD;  Location: [facility];  Service: General;  Laterality: Left;   UPPER GASTROINTESTINAL ENDOSCOPY      There were no vitals filed for this visit.   Subjective Assessment - 05/17/20 0948    Subjective 3/10 dizziness today, feeling much better today.  Went to see Dr. Posey Pronto who recommended neuro-optometrist for continued visual exam.    Pertinent History Hx of L breast CA (**no BP in LUE) s/p chemo/radiation, hx of lymphoma, foot surgery (bunions), depression, bipolar disorder, osteoporosis, plantar fasciitis, anemia    Patient Stated Goals For my head to clear up and for my balance to get where it was before.    Currently in Pain? No/denies                   Vestibular Assessment - 05/17/20 1011      Positional Sensitivities   Nose to Right Knee Lightheadedness    Right Knee to Sitting Lightedness    Nose to Left Knee Lightheadedness    Left Knee to Sitting Lightheadedness    Head Turning x 5 Mild dizziness    Head Nodding x 5 Mild dizziness    Pivot Right in Standing Mild dizziness    Pivot Left in Standing Mild dizziness                     Vestibular Treatment/Exercise - 05/17/20 0951      Vestibular Treatment/Exercise   Vestibular Treatment Provided Gaze    Habituation Exercises 180 degree Turns    Gaze Exercises X1 Viewing Horizontal;X1 Viewing Vertical      180 degree Turns   Number of Reps  4    Symptom Description  standing in corner performed with eyes closed 90 deg turns to L and R x 4 reps each side focusing on 90 deg turns with feet instead of multiple small steps, moderate dizziness afterwards; did not provide for home yet.      X1 Viewing Horizontal   Foot Position standing feet apart and then feet together on solid surface    Reps 2    Comments 60 seconds, no symptoms      X1 Viewing Vertical   Foot Position standing feet apart and then feet together on solid surface    Reps 2     Comments 60 seconds, mild symptoms              Balance Exercises - 05/17/20 1025      Balance Exercises: Standing   Retro Gait Upper extremity support;4 reps   walking forwards/retro with eyes closed, hand on wall   Turning Right;Left;3 reps   90 deg in corner with EC            PT Education - 05/17/20 1022    Education Details updated HEP and assessed MSQ, will check LTG next session    Person(s) Educated Patient    Methods Explanation;Demonstration;Handout    Comprehension Verbalized understanding;Returned demonstration  PT Short Term Goals - 05/02/20 1320      PT SHORT TERM GOAL #1   Title Pt will be IND in initial HEP to improve dizziness and balance. TARGET DATE FOR ALL STGS:04/24/20    Baseline 05/02/20: met with current program    Status Achieved      PT SHORT TERM GOAL #2   Title Perform SOT and FGA and write STG/LTG as indicated.    Status Achieved      PT SHORT TERM GOAL #3   Title Pt will amb. 500' over even terrain, while performing head turns/nods, IND to improve functional mobility.    Baseline 05/02/20: met in session today    Time --    Period --    Status Achieved             PT Long Term Goals - 04/26/20 0949      PT LONG TERM GOAL #1   Title Pt will report no falls in the last four weeks to improve safety during functional mobility. TARGET DATE FOR LTGS: 05/22/20    Time 8    Period Weeks    Status New      PT LONG TERM GOAL #2   Title Pt will amb. 1000' over paved and uneven terrain, while performing head turns/nods, IND to improve safety during functional mobility.    Time 8    Period Weeks    Status New      PT LONG TERM GOAL #3   Title Pt will be IND in progressed HEP to improve dizziness and balance.    Time 8    Period Weeks    Status New      PT LONG TERM GOAL #4   Title Pt will improve FGA score to 30/30 to decr. falls risk.    Baseline 27/30    Time 8    Period Weeks    Status New      PT LONG TERM  GOAL #5   Title Pt will demonstrate improved use of somatosensory information for standing balance as indicated on SOT by increase in [facility] to >95% on sensory analysis (will maintain composite score of >77)    Baseline Sensory analysis for [facility]: 90%    Status New    Target Date 05/22/20                 Plan - 05/17/20 1030    Clinical Impression Statement Pt is demonstrating progress and reports decreased baseline dizziness today.  Able to progress x1 viewing to standing with narrow BOS and progress gait with eyes closed to include retro gait.  Based on MSQ, initiated 90 deg turns in corner with EC for habituation but unable to perform without supervision or verbal cues.  Will continue to address.    Personal Factors and Comorbidities Age;Comorbidity 3+;Past/Current Experience;Time since onset of injury/illness/exacerbation    Comorbidities Hx of L breast CA (**no BP in LUE) s/p chemo/radiation, hx of lymphoma, foot surgery (bunions), depression, bipolar disorder, osteoporosis, plantar fasciitis, anemia    Examination-Activity Limitations Bathing;Locomotion Level;Stairs;Transfers;Caring for Others   pt's sister will visit pt soon and she has to assist her sister to the bathroom and she's fearful of the steps   Examination-Participation Restrictions Cleaning;Yard Work;Interpersonal Relationship    Stability/Clinical Decision Making Evolving/Moderate complexity    Rehab Potential Good    PT Frequency 2x / week    PT Duration 8 weeks    PT Treatment/Interventions ADLs/Self Care Home Management;Aquatic  Therapy;Biofeedback;Canalith Repostioning;DME Instruction;Balance training;Neuromuscular re-education;Manual techniques;Therapeutic exercise;Therapeutic activities;Functional mobility training;Patient/family education;Stair training;Gait training;Cognitive remediation;Vestibular    PT Next Visit Plan **NO BP IN LUE**  Progress HEP as indicated.  Check LTG and recert as needed.  Work on turns in  [place] with EC, progress x1 viewing.  Balance with eyes closed    PT Home Exercise Plan x1 viewing and corner/counter balance [email or web address] in [employer]    Consulted and Agree with Plan of Care Patient           Patient will benefit from skilled therapeutic intervention in order to improve the following deficits and impairments:  Abnormal gait, Dizziness, Decreased activity tolerance, Decreased knowledge of use of DME, Decreased balance, Decreased cognition, Decreased mobility, Decreased strength, Impaired sensation, Impaired flexibility  Visit Diagnosis: Other abnormalities of gait and mobility  Dizziness and giddiness  Other disturbances of skin sensation     Problem List Patient Active Problem List   Diagnosis Date Noted   Cognitive deficits 05/16/2020   Visual disturbance 05/16/2020   Sleep disturbance 04/04/2020   Post concussive syndrome 03/02/2020   Dizziness and giddiness 19/16/6060   Follicular lymphoma ([place]) 11/16/2018   Duodenum disorder    Upper gastrointestinal bleed 07/02/2018   Abdominal pain, epigastric 07/01/2018   Elevated lipase 07/01/2018   Elevated LFTs 07/01/2018   Iron deficiency anemia 08/17/2017   C. 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 [place], [place], [address] Phone: [phone]   Fax:  [phone]  Name: Rhonda Gardner MRN: [id number] Date of Birth: 02-10-1956

## 2020-05-17 NOTE — Patient Instructions (Addendum)
Access Code: [email or web address] URL: [email or web address] Date: 05/12/2020 Prepared by: Willow Ora  Exercises Walking with Head Rotation - 1 x daily - 5 x weekly - 1 sets - 4 reps Walking with Eyes Closed and Counter Support - 1 x daily - 5 x weekly - 1 sets - 4 reps Romberg Stance Eyes Closed on Foam Pad - 1 x daily - 5 x weekly - 1 sets - 3 reps - 30 hold Romberg Stance with Head Nods on Foam Pad - 1 x daily - 5 x weekly - 2 sets - 10 reps  Wide Tandem Stance on Foam Pad with Eyes Closed - 1 x daily - 5 x weekly - 1 sets - 3 reps - 30 hold   Gaze Stabilization: Standing Feet Together    Feet together, keeping eyes on target on wall __3__ feet away, tilt head down 15-30 and move head side to side for __60__ seconds. Repeat while moving head up and down for __60__ seconds. Do __2__ sessions per day.

## 2020-05-22 ENCOUNTER — Ambulatory Visit: Payer: Medicare Other | Admitting: Physical Therapy

## 2020-05-22 ENCOUNTER — Other Ambulatory Visit: Payer: Self-pay

## 2020-05-22 ENCOUNTER — Encounter: Payer: Self-pay | Admitting: Physical Therapy

## 2020-05-22 DIAGNOSIS — R208 Other disturbances of skin sensation: Secondary | ICD-10-CM

## 2020-05-22 DIAGNOSIS — R2689 Other abnormalities of gait and mobility: Secondary | ICD-10-CM | POA: Diagnosis not present

## 2020-05-22 DIAGNOSIS — R42 Dizziness and giddiness: Secondary | ICD-10-CM

## 2020-05-22 NOTE — Therapy (Signed)
[facility] [address] [place], [place], [address] Phone: [phone]   Fax:  [phone]  Physical Therapy Treatment  Patient Details  Name: Rhonda Gardner MRN: [id number] Date of Birth: August 14, 1956 Referring Provider (PT): Delice Lesch, MD   Encounter Date: 05/22/2020   PT End of Session - 05/22/20 1646    Visit Number 9    Number of Visits 17    Date for PT Re-Evaluation 05/25/20   date corrected due to 8 week POC   Authorization Type Medicare: PN every 10th visit.    Progress Note Due on Visit 10    PT Start Time 1020    PT Stop Time 1104    PT Time Calculation (min) 44 min    Activity Tolerance Patient tolerated treatment well    Behavior During Therapy [facility] for tasks assessed/performed           Past Medical History:  Diagnosis Date   Allergy 2006   chemical cauterization in spring 2006, and 2nd treatment with good results.(Dr.Shoemaker)   Bipolar disorder ([place])    Bleeding ulcer    Blood transfusion without reported diagnosis 07/2017   Bleeding ulcer   Breast cancer ([place]) 12/2014   invasive ductal carcinoma (LEFT)   Breast cancer of upper-outer quadrant of left female breast ([place]) 12/22/2014   Breast hematoma 12/19/2015   had left breast aspiration of 4cm hematoma-path indicates benign hematoma   C. difficile colitis    C. 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)   Osteoporosis 06/20/2016   T-2.5'@spine'  on Dexa ([place])   Plantar fasciitis    PONV (postoperative nausea and vomiting)    Symptomatic anemia 07/16/2017    Past Surgical History:  Procedure Laterality Date   BIOPSY  07/03/2018   Procedure: BIOPSY;  Surgeon: Gatha Mayer, MD;  Location: [facility] ENDOSCOPY;  Service: Endoscopy;;   BIOPSY  09/02/2018   Procedure: BIOPSY;  Surgeon: Gatha Mayer, MD;  Location: [facility];  Service: Endoscopy;;    BIOPSY  10/22/2018   Procedure: BIOPSY;  Surgeon: Milus Banister, MD;  Location: [facility];  Service: Endoscopy;;   BREAST BIOPSY Bilateral    benign and a right stereotatic breast biopsy.   COLONOSCOPY     ESOPHAGOGASTRODUODENOSCOPY N/A 07/16/2017   Procedure: ESOPHAGOGASTRODUODENOSCOPY (EGD);  Surgeon: Ladene Artist, MD;  Location[facility] ENDOSCOPY;  Service: Endoscopy;  Laterality: N/A;   ESOPHAGOGASTRODUODENOSCOPY N/A 10/22/2018   Procedure: ESOPHAGOGASTRODUODENOSCOPY (EGD);  Surgeon: Milus Banister, MD;  Location: [facility];  Service: Endoscopy;  Laterality: N/A;   ESOPHAGOGASTRODUODENOSCOPY (EGD) WITH PROPOFOL N/A 07/03/2018   Procedure: ESOPHAGOGASTRODUODENOSCOPY (EGD) WITH PROPOFOL;  Surgeon: Gatha Mayer, MD;  Location: [facility];  Service: Endoscopy;  Laterality: N/A;   ESOPHAGOGASTRODUODENOSCOPY (EGD) WITH PROPOFOL N/A 09/02/2018   Procedure: ESOPHAGOGASTRODUODENOSCOPY (EGD) WITH PROPOFOL;  Surgeon: Gatha Mayer, MD;  Location: [facility];  Service: Endoscopy;  Laterality: N/A;   EUS N/A 10/22/2018   Procedure: UPPER ENDOSCOPIC ULTRASOUND (EUS) RADIAL;  Surgeon: Milus Banister, MD;  Location: [facility] ENDOSCOPY;  Service: Endoscopy;  Laterality: N/A;   fibroid excision  age 37   prolapsed fibroid removed   FINE NEEDLE ASPIRATION  10/22/2018   Procedure: FINE NEEDLE ASPIRATION;  Surgeon: Milus Banister, MD;  Location: [facility]OS[facility];  Service: Endoscopy;;   FOOT SURGERY Left 2014   Dr. Paulla Dolly   FOOT SURGERY Right 08/2016  Dr. Paulla Dolly --Shortening Osteotomy with Fixation 1st Metatarsal    MOUTH SURGERY  06/2017   [facility]    PORT-A-CATH REMOVAL N/A 02/15/2016   Procedure: REMOVAL PORT-A-CATH;  Surgeon: Autumn Messing III, MD;  Location: [facility];  Service: General;  Laterality: N/A;   RADIOACTIVE SEED GUIDED PARTIAL MASTECTOMY WITH AXILLARY SENTINEL LYMPH NODE BIOPSY Left 05/25/2015   Procedure: LEFT BREAST LUMPECTOMY WITH RADIOACTIVE SEED AND SENTINEL  LYMPH NODE BIOPSY;  Surgeon: Autumn Messing III, MD;  Location: [facility];  Service: General;  Laterality: Left;   UPPER GASTROINTESTINAL ENDOSCOPY      There were no vitals filed for this visit.   Subjective Assessment - 05/22/20 1022    Subjective Had a good weekend, no significant changes.  Letter exercise is much more difficult in standing.    Pertinent History Hx of L breast CA (**no BP in LUE) s/p chemo/radiation, hx of lymphoma, foot surgery (bunions), depression, bipolar disorder, osteoporosis, plantar fasciitis, anemia    Patient Stated Goals For my head to clear up and for my balance to get where it was before.    Currently in Pain? No/denies              [facility] PT Assessment - 05/22/20 1027      Assessment   Medical Diagnosis Postconcussion syndrome    Referring Provider (PT) Delice Lesch, MD    Onset Date/Surgical Date 03/03/19    Hand Dominance Right    Next MD Visit 04/04/20    Prior Therapy none      Precautions   Precautions Fall    Precaution Comments osteoporosis      Prior Function   Level of Independence Independent      Functional Gait  Assessment   Gait assessed  Yes    Gait Level Surface Walks 20 ft in less than 5.5 sec, no assistive devices, good speed, no evidence for imbalance, normal gait pattern, deviates no more than 6 in outside of the 12 in walkway width.    Change in Gait Speed Able to smoothly change walking speed without loss of balance or gait deviation. Deviate no more than 6 in outside of the 12 in walkway width.    Gait with Horizontal Head Turns Performs head turns smoothly with slight change in gait velocity (eg, minor disruption to smooth gait path), deviates 6-10 in outside 12 in walkway width, or uses an assistive device.    Gait with Vertical Head Turns Performs head turns with no change in gait. Deviates no more than 6 in outside 12 in walkway width.    Gait and Pivot Turn Pivot turns safely within 3 sec and stops quickly  with no loss of balance.    Step Over Obstacle Is able to step over 2 stacked shoe boxes taped together (9 in total height) without changing gait speed. No evidence of imbalance.    Gait with Narrow Base of Support Is able to ambulate for 10 steps heel to toe with no staggering.    Gait with Eyes Closed Walks 20 ft, uses assistive device, slower speed, mild gait deviations, deviates 6-10 in outside 12 in walkway width. Ambulates 20 ft in less than 9 sec but greater than 7 sec.    Ambulating Backwards Walks 20 ft, no assistive devices, good speed, no evidence for imbalance, normal gait    Steps Alternating feet, no rail.    Total Score 28    FGA comment: 28/30  Vestibular Assessment - 05/22/20 1043      Balancemaster   Engineer, materials Comment mild dizziness at the end; composite score of 76; see full explanation below           Conditions: 1: 3 trials WFL 2: 2 trials below normal, last trial [facility] 3:  3 trials WFL 4: 3 trials WFL 5: 2 trials WFL, one trial counted as a fall due to touching wall 6: 3 trials WFL Composite score: 76 Sensory Analysis Som: Below normal 90% Vis: WNL Vest: WNL Pref: WNL Strategy analysis: 75-100% hip/ankle strategy COG alignment: Pt with shift slightly to the R but more midline today.         [facility] Adult PT Treatment/Exercise - 05/22/20 1643      Therapeutic Activites    Therapeutic Activities Other Therapeutic Activities    Other Therapeutic Activities Patient how long it takes to realize you have a concussion after a fall.  Pt feels her diagnosis and care came too late.  Discussed that there is no "test" that proves a person has experienced a concussion - not seen on imaging and do not have to have LOC to have concussion - so physicians diagnose it based on cluster of symptoms.  Discussed that some concussions heal without intervention and some have protracted recovery and require  rehabilitation.  Attempted to encourage pt that a delayed diagnosis does not mean she will not experience improvement.                    PT Education - 05/22/20 1645    Education Details see TA; progress towards goals, goals to be assessed at next session and areas to continue to address with therapy    Person(s) Educated Patient    Methods Explanation    Comprehension Verbalized understanding            PT Short Term Goals - 05/02/20 1320      PT SHORT TERM GOAL #1   Title Pt will be IND in initial HEP to improve dizziness and balance. TARGET DATE FOR ALL STGS:04/24/20    Baseline 05/02/20: met with current program    Status Achieved      PT SHORT TERM GOAL #2   Title Perform SOT and FGA and write STG/LTG as indicated.    Status Achieved      PT SHORT TERM GOAL #3   Title Pt will amb. 500' over even terrain, while performing head turns/nods, IND to improve functional mobility.    Baseline 05/02/20: met in session today    Time --    Period --    Status Achieved             PT Long Term Goals - 05/22/20 1023      PT LONG TERM GOAL #1   Title Pt will report no falls in the last four weeks to improve safety during functional mobility. TARGET DATE FOR LTGS: 05/22/20    Baseline 1 fall in the shower in the past two months    Time 8    Period Weeks    Status Partially Met      PT LONG TERM GOAL #2   Title Pt will amb. 1000' over paved and uneven terrain, while performing head turns/nods, IND to improve safety during functional mobility.    Time 8    Period Weeks    Status On-going      PT LONG TERM GOAL #  3   Title Pt will be IND in progressed HEP to improve dizziness and balance.    Time 8    Period Weeks    Status On-going      PT LONG TERM GOAL #4   Title Pt will improve FGA score to 30/30 to decr. falls risk.    Baseline 27/30  >> 28/30    Time 8    Period Weeks    Status Partially Met      PT LONG TERM GOAL #5   Title Pt will demonstrate improved  use of somatosensory information for standing balance as indicated on SOT by increase in [facility] to >95% on sensory analysis (will maintain composite score of >77)    Baseline Sensory analysis for SOM: 90% - no change in somatosensory    Status Not Met                 Plan - 05/22/20 1647    Clinical Impression Statement Performed re-assessment of patient's balance while during dynamic gait and assessment of sensory integration with SOT.  Pt demonstrates improved balance and decreased dizziness with vertical head movements but continues to experience dizziness and imbalance with horizontal head turns and when vision is removed.  Pt's SOT score remained the same with decreased use of somatosensory information.  Will complete assessment of LTG next session and plan to recertify for 4 more weeks.    Personal Factors and Comorbidities Age;Comorbidity 3+;Past/Current Experience;Time since onset of injury/illness/exacerbation    Comorbidities Hx of L breast CA (**no BP in LUE) s/p chemo/radiation, hx of lymphoma, foot surgery (bunions), depression, bipolar disorder, osteoporosis, plantar fasciitis, anemia    Examination-Activity Limitations Bathing;Locomotion Level;Stairs;Transfers;Caring for Others   pt's sister will visit pt soon and she has to assist her sister to the bathroom and she's fearful of the steps   Examination-Participation Restrictions Cleaning;Yard Work;Interpersonal Relationship    Stability/Clinical Decision Making Evolving/Moderate complexity    Rehab Potential Good    PT Frequency 2x / week    PT Duration 8 weeks    PT Treatment/Interventions ADLs/Self Care Home Management;Aquatic Therapy;Biofeedback;Canalith Repostioning;DME Instruction;Balance training;Neuromuscular re-education;Manual techniques;Therapeutic exercise;Therapeutic activities;Functional mobility training;Patient/family education;Stair training;Gait training;Cognitive remediation;Vestibular    PT Next Visit Plan **NO  BP IN LUE**  Finish checking LTG, 10th visit PN and recert for 4 more weeks.  Work on turns in [place] with EC, progress x1 viewing.  Balance with eyes closed and walking with head turns.    PT Home Exercise Plan x1 viewing and corner/counter balance [email or web address] in [employer]    Consulted and Agree with Plan of Care Patient           Patient will benefit from skilled therapeutic intervention in order to improve the following deficits and impairments:  Abnormal gait, Dizziness, Decreased activity tolerance, Decreased knowledge of use of DME, Decreased balance, Decreased cognition, Decreased mobility, Decreased strength, Impaired sensation, Impaired flexibility  Visit Diagnosis: Other abnormalities of gait and mobility  Dizziness and giddiness  Other disturbances of skin sensation     Problem List Patient Active Problem List   Diagnosis Date Noted   Cognitive deficits 05/16/2020   Visual disturbance 05/16/2020   Sleep disturbance 04/04/2020   Post concussive syndrome 03/02/2020   Dizziness and giddiness 77/08/6578   Follicular lymphoma ([place]) 11/16/2018   Duodenum disorder    Upper gastrointestinal bleed 07/02/2018   Abdominal pain, epigastric 07/01/2018   Elevated lipase 07/01/2018   Elevated LFTs 07/01/2018   Iron deficiency anemia 08/17/2017  C. 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) 12/22/2014    Rico Junker, PT, DPT 05/22/20    5:01 PM    [facility] [address] [place], [place], [address] Phone: [phone]   Fax:  [phone]  Name: Rhonda Gardner MRN: [id number] Date of Birth: 27-Jun-1956

## 2020-05-24 ENCOUNTER — Other Ambulatory Visit: Payer: Self-pay

## 2020-05-24 ENCOUNTER — Encounter: Payer: Self-pay | Admitting: Physical Therapy

## 2020-05-24 ENCOUNTER — Ambulatory Visit: Payer: Medicare Other | Admitting: Physical Therapy

## 2020-05-24 DIAGNOSIS — R2681 Unsteadiness on feet: Secondary | ICD-10-CM

## 2020-05-24 DIAGNOSIS — R42 Dizziness and giddiness: Secondary | ICD-10-CM

## 2020-05-24 DIAGNOSIS — R2689 Other abnormalities of gait and mobility: Secondary | ICD-10-CM | POA: Diagnosis not present

## 2020-05-24 DIAGNOSIS — R208 Other disturbances of skin sensation: Secondary | ICD-10-CM

## 2020-05-24 NOTE — Patient Instructions (Addendum)
Walking Program: - Slower pace - 20-30 minutes is your current baseline - 2x/week  - 1 times a week try to walk at a slightly faster pace for 15 minutes  (this is a challenge but I can keep going; mild dizziness)   Access Code: [email or web address] URL: [email or web address] Date: 05/12/2020 Prepared by: Willow Ora  Exercises Walking with Head Rotation - 1 x daily - 5 x weekly - 1 sets - 4 reps Walking with Eyes Closed and Counter Support - 1 x daily - 5 x weekly - 1 sets - 4 reps Romberg Stance Eyes Closed on Foam Pad - 1 x daily - 5 x weekly - 1 sets - 3 reps - 30 second hold Romberg Stance with Head Nods on Foam Pad - 1 x daily - 5 x weekly - 2 sets - 10 reps  Wide Tandem Stance on Foam Pad with Eyes Closed - 1 x daily - 5 x weekly - 1 sets - 3 reps - 30 hold   Gaze Stabilization: Standing Feet Together    Feet together, keeping eyes on target on wall __3__ feet away, tilt head down 15-30 and move head side to side for __60__ seconds. Repeat while moving head up and down for __60__ seconds. Do __2__ sessions per day.

## 2020-05-24 NOTE — Therapy (Signed)
[facility] [address] [place], [place], [address] Phone: [phone]   Fax:  [phone]  Physical Therapy Treatment and 10th Visit Progress Note/Recert  Patient Details  Name: Rhonda Gardner MRN: [id number] Date of Birth: January 12, 1956 Referring Provider (PT): Delice Lesch, MD   Encounter Date: 05/24/2020   Progress Note Reporting Period 03/27/20 to 05/24/2020  See note below for Objective Data and Assessment of Progress/Goals.     PT End of Session - 05/24/20 0935    Visit Number 10    Number of Visits 18    Date for PT Re-Evaluation 06/23/20    Authorization Type Medicare: PN every 10th visit.    Progress Note Due on Visit 10    PT Start Time [phone]    PT Stop Time 0930    PT Time Calculation (min) 41 min    Activity Tolerance Patient tolerated treatment well    Behavior During Therapy [facility] for tasks assessed/performed           Past Medical History:  Diagnosis Date  . Allergy 2006   chemical cauterization in spring 2006, and 2nd treatment with good results.(Dr.Shoemaker)  . Bipolar disorder ([place])   . Bleeding ulcer   . Blood transfusion without reported diagnosis 07/2017   Bleeding ulcer  . Breast cancer ([place]) 12/2014   invasive ductal carcinoma (LEFT)  . Breast cancer of upper-outer quadrant of left female breast ([place]) 12/22/2014  . Breast hematoma 12/19/2015   had left breast aspiration of 4cm hematoma-path indicates benign hematoma  . C. difficile colitis   . C. difficile diarrhea   . Depression   . 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 Osteoporosis 06/20/2016   T-2.5'@spine'  on Dexa ([place])  . Plantar fasciitis   . PONV (postoperative nausea and vomiting)   . Symptomatic anemia 07/16/2017    Past Surgical History:  Procedure Laterality Date  . BIOPSY  07/03/2018   Procedure: BIOPSY;  Surgeon: Gatha Mayer, MD;  Location: [facility];  Service:  Endoscopy;;  . BIOPSY  09/02/2018   Procedure: BIOPSY;  Surgeon: Gatha Mayer, MD;  Location: [facility]OSCOPY;  Service: Endoscopy;;  . BIOPSY  10/22/2018   Procedure: BIOPSY;  Surgeon: Milus Banister, MD;  Location: [facility]OS[facility];  Service: Endoscopy;;  . BREAST BIOPSY Bilateral    benign and a right stereotatic breast biopsy.  . COLONOSCOPY    . ESOPHAGOGASTRODUODENOSCOPY N/A 07/16/2017   Procedure: ESOPHAGOGASTRODUODENOSCOPY (EGD);  Surgeon: Ladene Artist, MD;  Location: [facility] ENDOSCOPY;  Service: Endoscopy;  Laterality: N/A;  . ESOPHAGOGASTRODUODENOSCOPY N/A 10/22/2018   Procedure: ESOPHAGOGASTRODUODENOSCOPY (EGD);  Surgeon: Milus Banister, MD;  Location: [facility];  Service: Endoscopy;  Laterality: N/A;  . ESOPHAGOGASTRODUODENOSCOPY (EGD) WITH PROPOFOL N/A 07/03/2018   Procedure: ESOPHAGOGASTRODUODENOSCOPY (EGD) WITH PROPOFOL;  Surgeon: Gatha Mayer, MD;  Location: [facility];  Service: Endoscopy;  Laterality: N/A;  . ESOPHAGOGASTRODUODENOSCOPY (EGD) WITH PROPOFOL N/A 09/02/2018   Procedure: ESOPHAGOGASTRODUODENOSCOPY (EGD) WITH PROPOFOL;  Surgeon: Gatha Mayer, MD;  Location: [facility]OS[facility];  Service: Endoscopy;  Laterality: N/A;  . EUS N/A 10/22/2018   Procedure: UPPER ENDOSCOPIC ULTRASOUND (EUS) RADIAL;  Surgeon: Milus Banister, MD;  Location: [facility] ENDOSCOPY;  Service: Endoscopy;  Laterality: N/A;  . fibroid excision  age 37   prolapsed fibroid removed  . FINE NEEDLE ASPIRATION  10/22/2018   Procedure: FINE NEEDLE ASPIRATION;  Surgeon: Milus Banister, MD;  Location: [facility] ENDOSCOPY;  Service: Endoscopy;;  . FOOT SURGERY Left 2014   Dr. Paulla Dolly  . FOOT SURGERY Right 08/2016   Dr. Paulla Dolly --Shortening Osteotomy with Fixation 1st Metatarsal   . MOUTH SURGERY  06/2017   [facility]   . PORT-A-CATH REMOVAL N/A 02/15/2016   Procedure: REMOVAL PORT-A-CATH;  Surgeon: Autumn Messing III, MD;  Location: [facility];  Service: General;  Laterality: N/A;  . RADIOACTIVE SEED GUIDED PARTIAL  MASTECTOMY WITH AXILLARY SENTINEL LYMPH NODE BIOPSY Left 05/25/2015   Procedure: LEFT BREAST LUMPECTOMY WITH RADIOACTIVE SEED AND SENTINEL LYMPH NODE BIOPSY;  Surgeon: Autumn Messing III, MD;  Location: [facility];  Service: General;  Laterality: Left;  . UPPER GASTROINTESTINAL ENDOSCOPY      There were no vitals filed for this visit.   Subjective Assessment - 05/24/20 0851    Subjective Not feeling as good today; went for a walk with friends yesterday and they were walking much faster than her.  Feeling more lightheaded and dizzy today.  Took a long time to recover yesterday.  Previously pt was walking up/down her driveway but slowly.    Pertinent History Hx of L breast CA (**no BP in LUE) s/p chemo/radiation, hx of lymphoma, foot surgery (bunions), depression, bipolar disorder, osteoporosis, plantar fasciitis, anemia    Patient Stated Goals For my head to clear up and for my balance to get where it was before.    Currently in Pain? No/denies              [facility] PT Assessment - 05/24/20 0315      Assessment   Medical Diagnosis Postconcussion syndrome    Referring Provider (PT) Delice Lesch, MD    Onset Date/Surgical Date 03/03/19    Hand Dominance Right    Next MD Visit 04/04/20    Prior Therapy none      Precautions   Precautions Fall    Precaution Comments osteoporosis      Prior Function   Level of Independence Independent    Vocation Retired    Public house manager Requirements 0      Observation/Other Assessments   Focus on Therapeutic Outcomes (FOTO)  Not assessed      Ambulation/Gait   Ambulation/Gait Yes    Ambulation/Gait Assistance 6: Modified independent (Device/Increase time)    Ambulation/Gait Assistance Details MOD I focusing on increasing pace of walking outside over pavement; first lap performed pt's self selected pace: 28 minute mile.  Second lap performed slightly faster at 23 minute mile with mild dizziness symptoms    Ambulation Distance (Feet) 2000 Feet     Assistive device None    Gait Pattern Step-through pattern    Ambulation Surface Unlevel;Outdoor;Paved                         [facility] Adult PT Treatment/Exercise - 05/24/20 0918      Exercises   Exercises Other Exercises    Other Exercises  reviewed HEP below with pt reporting mild-moderate symptoms with each exercise.  No changes to exercises except removed wide staggered stance with EC.  All other exercises the same.  Added progressive walking program to focus on exertional symptoms.             Walking Program: - Slower pace - 20-30 minutes is your current baseline - 2x/week  - 1 times a week try to walk at a slightly faster pace for 15 minutes  (this is a challenge but I can keep going;  mild dizziness)   Access Code: [email or web address] URL: [email or web address] Date: 05/12/2020 Prepared by: Willow Ora  Exercises Walking with Head Rotation - 1 x daily - 5 x weekly - 1 sets - 4 reps Walking with Eyes Closed and Counter Support - 1 x daily - 5 x weekly - 1 sets - 4 reps Romberg Stance Eyes Closed on Foam Pad - 1 x daily - 5 x weekly - 1 sets - 3 reps - 30 second hold Romberg Stance with Head Nods on Foam Pad - 1 x daily - 5 x weekly - 2 sets - 10 reps  Wide Tandem Stance on Foam Pad with Eyes Closed - 1 x daily - 5 x weekly - 1 sets - 3 reps - 30 hold   Gaze Stabilization: Standing Feet Together    Feet together, keeping eyes on target on wall __3__ feet away, tilt head down 15-30 and move head side to side for __60__ seconds. Repeat while moving head up and down for __60__ seconds. Do __2__ sessions per day.         PT Education - 05/24/20 0934    Education Details added walking program; recerted for more visits    Person(s) Educated Patient    Methods Explanation;Demonstration;Handout    Comprehension Verbalized understanding;Returned demonstration            PT Short Term Goals - 05/02/20 1320      PT SHORT TERM GOAL #1   Title Pt  will be IND in initial HEP to improve dizziness and balance. TARGET DATE FOR ALL STGS:04/24/20    Baseline 05/02/20: met with current program    Status Achieved      PT SHORT TERM GOAL #2   Title Perform SOT and FGA and write STG/LTG as indicated.    Status Achieved      PT SHORT TERM GOAL #3   Title Pt will amb. 500' over even terrain, while performing head turns/nods, IND to improve functional mobility.    Baseline 05/02/20: met in session today    Time --    Period --    Status Achieved             PT Long Term Goals - 05/24/20 1027      PT LONG TERM GOAL #1   Title Pt will report no falls in the last four weeks to improve safety during functional mobility. TARGET DATE FOR LTGS: 05/22/20    Baseline 1 fall in the shower in the past two months    Time 8    Period Weeks    Status Partially Met      PT LONG TERM GOAL #2   Title Pt will amb. 1000' over paved and uneven terrain, while performing head turns/nods, IND to improve safety during functional mobility.    Time 8    Period Weeks    Status Achieved      PT LONG TERM GOAL #3   Title Pt will be IND in progressed HEP to improve dizziness and balance.    Time 8    Period Weeks    Status Achieved      PT LONG TERM GOAL #4   Title Pt will improve FGA score to 30/30 to decr. falls risk.    Baseline 27/30  >> 28/30    Time 8    Period Weeks    Status Partially Met      PT LONG TERM GOAL #5   Title Pt will demonstrate  improved use of somatosensory information for standing balance as indicated on SOT by increase in [facility] to >95% on sensory analysis (will maintain composite score of >77)    Baseline Sensory analysis for SOM: 90% - no change in somatosensory    Status Not Met            New goals for recertification:  PT Short Term Goals - 05/24/20 1044      PT SHORT TERM GOAL #1   Title = LTG for 4 week POC           PT Long Term Goals - 05/24/20 1045      PT LONG TERM GOAL #1   Title Pt will progress with  walking program to address symptoms with exertion by reporting ability to ambulate x 30 minutes outside on uneven pavement at moderate pace (20-23 minute mile)    Time 4    Period Weeks    Status New    Target Date 06/23/20      PT LONG TERM GOAL #2   Title Pt will demonstrate independence with progressed HEP    Time 4    Period Weeks    Status Revised    Target Date 06/23/20      PT LONG TERM GOAL #3   Title Pt will report 1/5 dizziness overall with all movements on MSQ to indicate decreased motion sensitivity    Time 4    Period Weeks    Status New    Target Date 06/23/20      PT LONG TERM GOAL #4   Title Pt will improve FGA score to 29/30 to decr. falls risk.    Baseline 27/30  >> 28/30    Time 4    Period Weeks    Status Revised    Target Date 06/23/20      PT LONG TERM GOAL #5   Title Pt will demonstrate improved use of somatosensory information for standing balance as indicated on SOT by increase in SOM to >95% on sensory analysis (will maintain composite score of >77)    Baseline Sensory analysis for SOM: 90% - no change in somatosensory    Time 4    Period Weeks    Status Revised    Target Date 06/23/20                 Plan - 05/24/20 1032    Clinical Impression Statement Completed assessment of LTG; pt is making steady progress and has met 2/5 LTG, partially met 2 goals and did not meet 1 goal.  Pt is independent with progressive HEP and is performing walking outside on pavement without LOB at home and today at the clinic but when pace of walking is increased pt experiences a flare in symptoms indicating ongoing vestibular and exertional impairments.  Pt has made progress with FGA but not to goal level and continues to experience balance impairments when vision removed.  SOT remains the same as previous assessment with ongoing impairments in use of somatosensory information.  Pt will benefit from continued skilled PT services to address these ongoing impairments  and functional limitations in order to minimize falls risk and maximize functional mobility independence.    Personal Factors and Comorbidities Age;Comorbidity 3+;Past/Current Experience;Time since onset of injury/illness/exacerbation    Comorbidities Hx of L breast CA (**no BP in LUE) s/p chemo/radiation, hx of lymphoma, foot surgery (bunions), depression, bipolar disorder, osteoporosis, plantar fasciitis, anemia    Examination-Activity Limitations Bathing;Locomotion Level;Stairs;Transfers;Caring for  Others   pt's sister will visit pt soon and she has to assist her sister to the bathroom and she's fearful of the steps   Examination-Participation Restrictions Cleaning;Yard Work;Interpersonal Relationship    Stability/Clinical Decision Making Evolving/Moderate complexity    Rehab Potential Good    PT Frequency 2x / week    PT Duration 4 weeks    PT Treatment/Interventions ADLs/Self Care Home Management;Aquatic Therapy;Biofeedback;Canalith Repostioning;DME Instruction;Balance training;Neuromuscular re-education;Manual techniques;Therapeutic exercise;Therapeutic activities;Functional mobility training;Patient/family education;Stair training;Gait training;Cognitive remediation;Vestibular;Cryotherapy;Moist Heat;Passive range of motion    PT Next Visit Plan **NO BP IN LUE**   Progress walking program.  Work on turns in corner with EC, progress x1 viewing adding more compliant surface.  Balance with eyes closed and walking with head turns.    PT Home Exercise Plan x1 viewing and corner/counter balance [email or web address] in [employer]    Consulted and Agree with Plan of Care Patient           Patient will benefit from skilled therapeutic intervention in order to improve the following deficits and impairments:  Abnormal gait, Dizziness, Decreased activity tolerance, Decreased knowledge of use of DME, Decreased balance, Decreased cognition, Decreased mobility, Decreased strength, Impaired sensation, Impaired  flexibility  Visit Diagnosis: Other abnormalities of gait and mobility  Dizziness and giddiness  Other disturbances of skin sensation  Unsteadiness on feet     Problem List Patient Active Problem List   Diagnosis Date Noted  . Cognitive deficits 05/16/2020  . Visual disturbance 05/16/2020  . Sleep disturbance 04/04/2020  . Post concussive syndrome 03/02/2020  . Dizziness and giddiness 03/02/2020  . Follicular lymphoma ([place]) 11/16/2018  . Duodenum disorder   . Upper gastrointestinal bleed 07/02/2018  . Abdominal pain, epigastric 07/01/2018  . Elevated lipase 07/01/2018  . Elevated LFTs 07/01/2018  . Iron deficiency anemia 08/17/2017  . C. difficile colitis 08/17/2017  . Dehydration 08/17/2017  . Hypokalemia 08/17/2017  . Symptomatic anemia 07/16/2017  . Bipolar 1 disorder ([place]) 07/16/2017  . Hypotension due to hypovolemia 07/16/2017  . Melena   . Heme positive stool   . Chronic duodenal ulcer with hemorrhage   . Blood transfusion without reported diagnosis 07/14/2017  . Osteoporosis 07/29/2016  . Genetic counseling and testing 01/29/2016  . Pain in joint, pelvic region and thigh 06/12/2015  . 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 Allergy to adhesive tape 01/25/2015  . Malignant neoplasm of upper-outer quadrant of left breast in female, estrogen receptor negative ([place]) 12/22/2014    Rico Junker, PT, DPT 05/24/20    10:38 AM    [facility] [address] [place], [place], [address] Phone: [phone]   Fax:  [phone]  Name: Rhonda Gardner MRN: [id number] Date of Birth: 03-07-1956

## 2020-05-29 ENCOUNTER — Other Ambulatory Visit: Payer: Self-pay

## 2020-05-29 ENCOUNTER — Encounter: Payer: Self-pay | Admitting: Physical Therapy

## 2020-05-29 ENCOUNTER — Ambulatory Visit: Payer: Medicare Other | Admitting: Physical Therapy

## 2020-05-29 DIAGNOSIS — R208 Other disturbances of skin sensation: Secondary | ICD-10-CM

## 2020-05-29 DIAGNOSIS — R2689 Other abnormalities of gait and mobility: Secondary | ICD-10-CM | POA: Diagnosis not present

## 2020-05-29 DIAGNOSIS — R42 Dizziness and giddiness: Secondary | ICD-10-CM

## 2020-05-29 DIAGNOSIS — R2681 Unsteadiness on feet: Secondary | ICD-10-CM

## 2020-05-29 NOTE — Therapy (Signed)
[facility] [address] [place], [place], [address] Phone: [phone]   Fax:  [phone]  Physical Therapy Treatment  Patient Details  Name: Rhonda Gardner MRN: [id number] Date of Birth: 1956-06-15 Referring Provider (PT): Delice Lesch, MD   Encounter Date: 05/29/2020   PT End of Session - 05/29/20 1102    Visit Number 11    Number of Visits 18    Date for PT Re-Evaluation 06/23/20    Authorization Type Medicare: PN every 10th visit.    Progress Note Due on Visit 20    PT Start Time 1020    PT Stop Time 1102    PT Time Calculation (min) 42 min    Activity Tolerance Patient tolerated treatment well    Behavior During Therapy [facility] for tasks assessed/performed           Past Medical History:  Diagnosis Date   Allergy 2006   chemical cauterization in spring 2006, and 2nd treatment with good results.(Dr.Shoemaker)   Bipolar disorder ([place])    Bleeding ulcer    Blood transfusion without reported diagnosis 07/2017   Bleeding ulcer   Breast cancer ([place]) 12/2014   invasive ductal carcinoma (LEFT)   Breast cancer of upper-outer quadrant of left female breast ([place]) 12/22/2014   Breast hematoma 12/19/2015   had left breast aspiration of 4cm hematoma-path indicates benign hematoma   C. difficile colitis    C. 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, treated at [place] (radiation)   Osteoporosis 06/20/2016   T-2.5@spine  on Dexa ([place])   Plantar fasciitis    PONV (postoperative nausea and vomiting)    Symptomatic anemia 07/16/2017    Past Surgical History:  Procedure Laterality Date   BIOPSY  07/03/2018   Procedure: BIOPSY;  Surgeon: Gatha Mayer, MD;  Location: [facility] ENDOSCOPY;  Service: Endoscopy;;   BIOPSY  09/02/2018   Procedure: BIOPSY;  Surgeon: Gatha Mayer, MD;  Location: [facility];  Service: Endoscopy;;   BIOPSY  10/22/2018    Procedure: BIOPSY;  Surgeon: Milus Banister, MD;  [facility]: [facility];  Service: Endoscopy;;   BREAST BIOPSY Bilateral    benign and a right stereotatic breast biopsy.   COLONOSCOPY     ESOPHAGOGASTRODUODENOSCOPY N/A 07/16/2017   Procedure: ESOPHAGOGASTRODUODENOSCOPY (EGD);  Surgeon: Ladene Artist, MD;  Location: [facility] ENDOSCOPY;  Service: Endoscopy;  Laterality: N/A;   ESOPHAGOGASTRODUODENOSCOPY N/A 10/22/2018   Procedure: ESOPHAGOGASTRODUODENOSCOPY (EGD);  Surgeon: Milus Banister, MD;  Location: [facility];  Service: Endoscopy;  Laterality: N/A;   ESOPHAGOGASTRODUODENOSCOPY (EGD) WITH PROPOFOL N/A 07/03/2018   Procedure: ESOPHAGOGASTRODUODENOSCOPY (EGD) WITH PROPOFOL;  Surgeon: Gatha Mayer, MD;  [facility];  Service: Endoscopy;  Laterality: N/A;   ESOPHAGOGASTRODUODENOSCOPY (EGD) WITH PROPOFOL N/A 09/02/2018   Procedure: ESOPHAGOGASTRODUODENOSCOPY (EGD) WITH PROPOFOL;  Surgeon: Gatha Mayer, MD;  [facility];  Service: Endoscopy;  Laterality: N/A;   EUS N/A 10/22/2018   Procedure: UPPER ENDOSCOPIC ULTRASOUND (EUS) RADIAL;  Surgeon: Milus Banister, MD;  [facility];  Service: Endoscopy;  Laterality: N/A;   fibroid excision  age 78   prolapsed fibroid removed   FINE NEEDLE ASPIRATION  10/22/2018   Procedure: FINE NEEDLE ASPIRATION;  Surgeon: Milus Banister, MD;  [facility]: [facility];  Service: Endoscopy;;   FOOT SURGERY Left 2014   Dr. Paulla Dolly   FOOT SURGERY Right 08/2016   Dr. Paulla Dolly --Shortening Osteotomy with Fixation 1st  Metatarsal    MOUTH SURGERY  06/2017   CYST    PORT-A-CATH REMOVAL N/A 02/15/2016   Procedure: REMOVAL PORT-A-CATH;  Surgeon: Autumn Messing III, MD;  Location: [facility];  Service: General;  Laterality: N/A;   RADIOACTIVE SEED GUIDED PARTIAL MASTECTOMY WITH AXILLARY SENTINEL LYMPH NODE BIOPSY Left 05/25/2015   Procedure: LEFT BREAST LUMPECTOMY WITH RADIOACTIVE SEED AND SENTINEL LYMPH NODE BIOPSY;   Surgeon: Autumn Messing III, MD;  Location: [facility];  Service: General;  Laterality: Left;   UPPER GASTROINTESTINAL ENDOSCOPY      There were no vitals filed for this visit.   Subjective Assessment - 05/29/20 1025    Subjective Has been working on walking a little bit faster but has a hard time when going uphill.  Still having the hardest time with eyes closed.    Pertinent History Hx of L breast CA (**no BP in LUE) s/p chemo/radiation, hx of lymphoma, foot surgery (bunions), depression, bipolar disorder, osteoporosis, plantar fasciitis, anemia    Patient Stated Goals For my head to clear up and for my balance to get where it was before.                              Vestibular Treatment/Exercise - 05/29/20 1033      Vestibular Treatment/Exercise   Vestibular Treatment Provided Habituation    Habituation Exercises Standing Horizontal Head Turns;Standing Vertical Head Turns;Comment      Standing Horizontal Head Turns   Number of Reps  10    Symptom Description  On compliant surface - 2 sets.  EO, marching in place, no UE support.   EC marching in place with finger tip touch due to veering/turning - mild symptoms with EC      Standing Vertical Head Turns   Number of Reps  10    Symptom Description  On compliant surface - 2 sets.  EO, marching in place, no UE support.   EC marching in place with finger tip touch due to veering/turning - mild symptoms with EC      180 degree Turns   Number of Reps  10    Symptom Description  standing on foam, marching in place performed 10 total 180 turns alternating between right and left.  Then performed walking forwards with 180 turns on compliant mat x 10 alternating R and L turns.  Repeated with walking backwards on compliant mat.  Final set included walking forwards and bending down to ground to retrieve cone x 5 reps and then place on ground, performed walking forwards with turn and then walking backwards with turn.   Alternated between L and R turns with bending.  Symptoms stayed mild throughout.                 PT Education - 05/29/20 1102    Education Details keep HEP the same based on symptoms.  Added more visits through end of cert    Person(s) Educated Patient    Methods Explanation    Comprehension Verbalized understanding            PT Short Term Goals - 05/24/20 1044      PT SHORT TERM GOAL #1   Title = LTG for 4 week POC             PT Long Term Goals - 05/24/20 1045      PT LONG TERM GOAL #1   Title  Pt will progress with walking program to address symptoms with exertion by reporting ability to ambulate x 30 minutes outside on uneven pavement at moderate pace (20-23 minute mile)    Time 4    Period Weeks    Status New    Target Date 06/23/20      PT LONG TERM GOAL #2   Title Pt will demonstrate independence with progressed HEP    Time 4    Period Weeks    Status Revised    Target Date 06/23/20      PT LONG TERM GOAL #3   Title Pt will report 1/5 dizziness overall with all movements on MSQ to indicate decreased motion sensitivity    Time 4    Period Weeks    Status New    Target Date 06/23/20      PT LONG TERM GOAL #4   Title Pt will improve FGA score to 29/30 to decr. falls risk.    Baseline 27/30  >> 28/30    Time 4    Period Weeks    Status Revised    Target Date 06/23/20      PT LONG TERM GOAL #5   Title Pt will demonstrate improved use of somatosensory information for standing balance as indicated on SOT by increase in [facility] to >95% on sensory analysis (will maintain composite score of >77)    Baseline Sensory analysis for SOM: 90% - no change in somatosensory    Time 4    Period Weeks    Status Revised    Target Date 06/23/20                 Plan - 05/29/20 1333    Clinical Impression Statement Pt is making progress and was able to tolerate habituation training today on compliant surface with a combination of constant marching in place  with head movements, eyes closed as well as walking forwards/backwards with 180 turns and bending down to floor.  Pt tolerated well with only minimal dizziness that settled quickly.  Pt requested to keep HEP the same for now due to adequate challenge.  More visits added through end of certification period.    Personal Factors and Comorbidities Age;Comorbidity 3+;Past/Current Experience;Time since onset of injury/illness/exacerbation    Comorbidities Hx of L breast CA (**no BP in LUE) s/p chemo/radiation, hx of lymphoma, foot surgery (bunions), depression, bipolar disorder, osteoporosis, plantar fasciitis, anemia    Examination-Activity Limitations Bathing;Locomotion Level;Stairs;Transfers;Caring for Others   pt's sister will visit pt soon and she has to assist her sister to the bathroom and she's fearful of the steps   Examination-Participation Restrictions Cleaning;Yard Work;Interpersonal Relationship    Stability/Clinical Decision Making Evolving/Moderate complexity    Rehab Potential Good    PT Frequency 2x / week    PT Duration 4 weeks    PT Treatment/Interventions ADLs/Self Care Home Management;Aquatic Therapy;Biofeedback;Canalith Repostioning;DME Instruction;Balance training;Neuromuscular re-education;Manual techniques;Therapeutic exercise;Therapeutic activities;Functional mobility training;Patient/family education;Stair training;Gait training;Cognitive remediation;Vestibular;Cryotherapy;Moist Heat;Passive range of motion    PT Next Visit Plan **NO BP IN LUE**   Progress walking program?  Work on faster walking speeds, turns with EC, progress x1 viewing adding more compliant surface.  Balance with eyes closed and walking with head turns.    PT Home Exercise Plan x1 viewing and corner/counter balance [email or web address] in [facility] and Agree with Plan of Care Patient           Patient will benefit from skilled therapeutic intervention in order to  improve the following deficits and  impairments:  Abnormal gait, Dizziness, Decreased activity tolerance, Decreased knowledge of use of DME, Decreased balance, Decreased cognition, Decreased mobility, Decreased strength, Impaired sensation, Impaired flexibility  Visit Diagnosis: Other abnormalities of gait and mobility  Dizziness and giddiness  Other disturbances of skin sensation  Unsteadiness on feet     Problem List Patient Active Problem List   Diagnosis Date Noted   Cognitive deficits 05/16/2020   Visual disturbance 05/16/2020   Sleep disturbance 04/04/2020   Post concussive syndrome 03/02/2020   Dizziness and giddiness 33/83/2919   Follicular lymphoma ([place]) 11/16/2018   Duodenum disorder    Upper gastrointestinal bleed 07/02/2018   Abdominal pain, epigastric 07/01/2018   Elevated lipase 07/01/2018   Elevated LFTs 07/01/2018   Iron deficiency anemia 08/17/2017   C. 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

## 2020-05-29 NOTE — Patient Instructions (Signed)
Walking Program: - Slower pace - 20-30 minutes is your current baseline - 2x/week  - 1 times a week try to walk at a slightly faster pace for 15 minutes  (this is a challenge but I can keep going; mild dizziness)   Access Code: [email or web address] URL: [email or web address] Date: 05/12/2020 Prepared by: Willow Ora  Exercises Walking with Head Rotation - 1 x daily - 5 x weekly - 1 sets - 4 reps Walking with Eyes Closed and Counter Support - 1 x daily - 5 x weekly - 1 sets - 4 reps Romberg Stance Eyes Closed on Foam Pad - 1 x daily - 5 x weekly - 1 sets - 3 reps - 30 second hold Romberg Stance with Head Nods on Foam Pad - 1 x daily - 5 x weekly - 2 sets - 10 reps  Wide Tandem Stance on Foam Pad with Eyes Closed - 1 x daily - 5 x weekly - 1 sets - 3 reps - 30 hold   Gaze Stabilization: Standing Feet Together    Feet together, keeping eyes on target on wall __3__ feet away, tilt head down 15-30 and move head side to side for __60__ seconds. Repeat while moving head up and down for __60__ seconds. Do __2__ sessions per day.

## 2020-05-31 ENCOUNTER — Other Ambulatory Visit: Payer: Self-pay

## 2020-05-31 ENCOUNTER — Ambulatory Visit: Payer: Medicare Other | Admitting: Physical Therapy

## 2020-05-31 ENCOUNTER — Encounter: Payer: Self-pay | Admitting: Physical Therapy

## 2020-05-31 DIAGNOSIS — R208 Other disturbances of skin sensation: Secondary | ICD-10-CM

## 2020-05-31 DIAGNOSIS — R2681 Unsteadiness on feet: Secondary | ICD-10-CM

## 2020-05-31 DIAGNOSIS — R2689 Other abnormalities of gait and mobility: Secondary | ICD-10-CM

## 2020-05-31 DIAGNOSIS — R42 Dizziness and giddiness: Secondary | ICD-10-CM

## 2020-05-31 NOTE — Therapy (Addendum)
[facility] [address] [place], [place], [address] Phone: [phone]   Fax:  [phone]  Physical Therapy Treatment  Patient Details  Name: Rhonda Gardner MRN: [id number] Date of Birth: March 22, 1956 Referring Provider (PT): Delice Lesch, MD   Encounter Date: 05/31/2020   PT End of Session - 05/31/20 1018    Visit Number 12    Number of Visits 18    Date for PT Re-Evaluation 06/23/20    Authorization Type Medicare: PN every 10th visit.    Progress Note Due on Visit 20    PT Start Time [phone]    PT Stop Time 1015    PT Time Calculation (min) 41 min    Activity Tolerance Patient tolerated treatment well    Behavior During Therapy [facility] for tasks assessed/performed           Past Medical History:  Diagnosis Date  . Allergy 2006   chemical cauterization in spring 2006, and 2nd treatment with good results.(Dr.Shoemaker)  . Bipolar disorder ([place])   . Bleeding ulcer   . Blood transfusion without reported diagnosis 07/2017   Bleeding ulcer  . Breast cancer ([place]) 12/2014   invasive ductal carcinoma (LEFT)  . Breast cancer of upper-outer quadrant of left female breast ([place]) 12/22/2014  . Breast hematoma 12/19/2015   had left breast aspiration of 4cm hematoma-path indicates benign hematoma  . C. difficile colitis   . C. difficile diarrhea   . Depression   . 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 Osteoporosis 06/20/2016   T-2.5@spine  on Dexa ([facility])  . Plantar fasciitis   . PONV (postoperative nausea and vomiting)   . Symptomatic anemia 07/16/2017    Past Surgical History:  Procedure Laterality Date  . BIOPSY  07/03/2018   Procedure: BIOPSY;  Surgeon: Gatha Mayer, MD;  Location: [facility];  Service: Endoscopy;;  . BIOPSY  09/02/2018   Procedure: BIOPSY;  Surgeon: Gatha Mayer, MD;  [facility];  Service: Endoscopy;;  . BIOPSY  10/22/2018    Procedure: BIOPSY;  Surgeon: Milus Banister, MD;  Location: [facility];  Service: Endoscopy;;  . BREAST BIOPSY Bilateral    benign and a right stereotatic breast biopsy.  . COLONOSCOPY    . ESOPHAGOGASTRODUODENOSCOPY N/A 07/16/2017   Procedure: ESOPHAGOGASTRODUODENOSCOPY (EGD);  Surgeon: Ladene Artist, MD;  Location[facility] ENDOSCOPY;  Service: Endoscopy;  Laterality: N/A;  . ESOPHAGOGASTRODUODENOSCOPY N/A 10/22/2018   Procedure: ESOPHAGOGASTRODUODENOSCOPY (EGD);  Surgeon: Milus Banister, MD;  Location: [facility];  Service: Endoscopy;  Laterality: N/A;  . ESOPHAGOGASTRODUODENOSCOPY (EGD) WITH PROPOFOL N/A 07/03/2018   Procedure: ESOPHAGOGASTRODUODENOSCOPY (EGD) WITH PROPOFOL;  Surgeon: Gatha Mayer, MD;  [facility];  Service: Endoscopy;  Laterality: N/A;  . ESOPHAGOGASTRODUODENOSCOPY (EGD) WITH PROPOFOL N/A 09/02/2018   Procedure: ESOPHAGOGASTRODUODENOSCOPY (EGD) WITH PROPOFOL;  Surgeon: Gatha Mayer, MD;  [facility];  Service: Endoscopy;  Laterality: N/A;  . EUS N/A 10/22/2018   Procedure: UPPER ENDOSCOPIC ULTRASOUND (EUS) RADIAL;  Surgeon: Milus Banister, MD;  [facility];  Service: Endoscopy;  Laterality: N/A;  . fibroid excision  age 62   prolapsed fibroid removed  . FINE NEEDLE ASPIRATION  10/22/2018   Procedure: FINE NEEDLE ASPIRATION;  Surgeon: Milus Banister, MD;  Location: [facility];  Service: Endoscopy;;  . FOOT SURGERY Left 2014   Dr. Paulla Dolly  . FOOT SURGERY Right 08/2016   Dr. Paulla Dolly --Shortening Osteotomy with Fixation 1st  Metatarsal   . MOUTH SURGERY  06/2017   CYST   . PORT-A-CATH REMOVAL N/A 02/15/2016   Procedure: REMOVAL PORT-A-CATH;  Surgeon: Autumn Messing III, MD;  Location: [facility];  Service: General;  Laterality: N/A;  . RADIOACTIVE SEED GUIDED PARTIAL MASTECTOMY WITH AXILLARY SENTINEL LYMPH NODE BIOPSY Left 05/25/2015   Procedure: LEFT BREAST LUMPECTOMY WITH RADIOACTIVE SEED AND SENTINEL LYMPH NODE BIOPSY;   Surgeon: Autumn Messing III, MD;  Location: [facility];  Service: General;  Laterality: Left;  . UPPER GASTROINTESTINAL ENDOSCOPY      There were no vitals filed for this visit.   Subjective Assessment - 05/31/20 0936    Subjective Felt very woozy after last session but is feeling really good today.  Still would like to keep exercises where they are.    Pertinent History Hx of L breast CA (**no BP in LUE) s/p chemo/radiation, hx of lymphoma, foot surgery (bunions), depression, bipolar disorder, osteoporosis, plantar fasciitis, anemia    Patient Stated Goals For my head to clear up and for my balance to get where it was before.    Currently in Pain? No/denies                              Vestibular Treatment/Exercise - 05/31/20 0938      Vestibular Treatment/Exercise   Vestibular Treatment Provided Gaze    Habituation Exercises Standing Horizontal Head Turns    Gaze Exercises X1 Viewing Horizontal;X1 Viewing Vertical      Standing Horizontal Head Turns   Number of Reps  4    Symptom Description  first bouncing on trampoline x 30 seconds x 2 with head stable.  Then added in slow head turns to L and R; required seated rest breaks after horizontal head turns due to moderate dizziness      X1 Viewing Horizontal   Foot Position standing feet apart    Reps 2    Comments 30 > 60 seconds focusing on faster speeds, 120 beats per min      X1 Viewing Vertical   Foot Position standing feet apart    Reps 2    Comments 30 > 60 seconds focusing on faster speeds, 120 beats per min              Balance Exercises - 05/31/20 1002      Balance Exercises: Standing   Turning Right;Left;5 reps   90, 180, 360 with EC, mild-mod dizziness   Marching Foam/compliant surface;Static   30 seconds x 2 on trampoline, EO            PT Education - 05/31/20 1018    Education Details keep HEP the same except slightly increase speed of head movement with VOR    Person(s)  Educated Patient    Methods Explanation;Demonstration    Comprehension Verbalized understanding;Returned demonstration            PT Short Term Goals - 05/24/20 1044      PT SHORT TERM GOAL #1   Title = LTG for 4 week POC             PT Long Term Goals - 05/24/20 1045      PT LONG TERM GOAL #1   Title Pt will progress with walking program to address symptoms with exertion by reporting ability to ambulate x 30 minutes outside on uneven pavement at moderate pace (20-23 minute mile)  Time 4    Period Weeks    Status New    Target Date 06/23/20      PT LONG TERM GOAL #2   Title Pt will demonstrate independence with progressed HEP    Time 4    Period Weeks    Status Revised    Target Date 06/23/20      PT LONG TERM GOAL #3   Title Pt will report 1/5 dizziness overall with all movements on MSQ to indicate decreased motion sensitivity    Time 4    Period Weeks    Status New    Target Date 06/23/20      PT LONG TERM GOAL #4   Title Pt will improve FGA score to 29/30 to decr. falls risk.    Baseline 27/30  >> 28/30    Time 4    Period Weeks    Status Revised    Target Date 06/23/20      PT LONG TERM GOAL #5   Title Pt will demonstrate improved use of somatosensory information for standing balance as indicated on SOT by increase in SOM to >95% on sensory analysis (will maintain composite score of >77)    Baseline Sensory analysis for SOM: 90% - no change in somatosensory    Time 4    Period Weeks    Status Revised    Target Date 06/23/20                 Plan - 05/31/20 1019    Clinical Impression Statement Pt continues to make progress and was able to tolerate performing VOR at faster speeds, 180 and 360 turns in the corner with EC and bouncing/marching on more compliant trampoline today.  Pt reported moderate-severe dizziness when head turns added to bouncing so ceased performing head turns.  Will continue to address and progress towards LTG.    Personal  Factors and Comorbidities Age;Comorbidity 3+;Past/Current Experience;Time since onset of injury/illness/exacerbation    Comorbidities Hx of L breast CA (**no BP in LUE) s/p chemo/radiation, hx of lymphoma, foot surgery (bunions), depression, bipolar disorder, osteoporosis, plantar fasciitis, anemia    Examination-Activity Limitations Bathing;Locomotion Level;Stairs;Transfers;Caring for Others   pt's sister will visit pt soon and she has to assist her sister to the bathroom and she's fearful of the steps   Examination-Participation Restrictions Cleaning;Yard Work;Interpersonal Relationship    Stability/Clinical Decision Making Evolving/Moderate complexity    Rehab Potential Good    PT Frequency 2x / week    PT Duration 4 weeks    PT Treatment/Interventions ADLs/Self Care Home Management;Aquatic Therapy;Biofeedback;Canalith Repostioning;DME Instruction;Balance training;Neuromuscular re-education;Manual techniques;Therapeutic exercise;Therapeutic activities;Functional mobility training;Patient/family education;Stair training;Gait training;Cognitive remediation;Vestibular;Cryotherapy;Moist Heat;Passive range of motion    PT Next Visit Plan **NO BP IN LUE**   Work on faster walking speeds, turns with EC, progress x1 viewing adding more compliant surface.  Balance with eyes closed and walking with head turns.    PT Home Exercise Plan x1 viewing and corner/counter balance [email or web address] in [facility]    Consulted and Agree with Plan of Care Patient           Patient will benefit from skilled therapeutic intervention in order to improve the following deficits and impairments:  Abnormal gait, Dizziness, Decreased activity tolerance, Decreased knowledge of use of DME, Decreased balance, Decreased cognition, Decreased mobility, Decreased strength, Impaired sensation, Impaired flexibility  Visit Diagnosis: Other abnormalities of gait and mobility  Dizziness and giddiness  Other disturbances of skin  sensation  Unsteadiness on feet  Problem List Patient Active Problem List   Diagnosis Date Noted  . Cognitive deficits 05/16/2020  . Visual disturbance 05/16/2020  . Sleep disturbance 04/04/2020  . Post concussive syndrome 03/02/2020  . Dizziness and giddiness 03/02/2020  . Follicular lymphoma ([place]) 11/16/2018  . Duodenum disorder   . Upper gastrointestinal bleed 07/02/2018  . Abdominal pain, epigastric 07/01/2018  . Elevated lipase 07/01/2018  . Elevated LFTs 07/01/2018  . Iron deficiency anemia 08/17/2017  . C. difficile colitis 08/17/2017  . Dehydration 08/17/2017  . Hypokalemia 08/17/2017  . Symptomatic anemia 07/16/2017  . Bipolar 1 disorder ([place]) 07/16/2017  . Hypotension due to hypovolemia 07/16/2017  . Melena   . Heme positive stool   . Chronic duodenal ulcer with hemorrhage   . Blood transfusion without reported diagnosis 07/14/2017  . Osteoporosis 07/29/2016  . Genetic counseling and testing 01/29/2016  . Pain in joint, pelvic region and thigh 06/12/2015  . Chemotherapy-induced neuropathy ([place]) 03/06/2015  . Allergy to adhesive tape 01/25/2015  . Malignant neoplasm of upper-outer quadrant of left breast in female, estrogen receptor negative ([place]) 12/22/2014    Rico Junker, PT, DPT 05/31/20    4:57 PM    [facility] [address] [place], [place], [address] Phone: [phone]   Fax:  [phone]  Name: Rhonda Gardner MRN: [id number] Date of Birth: 1956-06-20

## 2020-06-05 ENCOUNTER — Other Ambulatory Visit: Payer: Self-pay

## 2020-06-05 ENCOUNTER — Encounter: Payer: Self-pay | Admitting: Physical Therapy

## 2020-06-05 ENCOUNTER — Ambulatory Visit: Payer: Medicare Other | Admitting: Physical Therapy

## 2020-06-05 DIAGNOSIS — R2681 Unsteadiness on feet: Secondary | ICD-10-CM

## 2020-06-05 DIAGNOSIS — R42 Dizziness and giddiness: Secondary | ICD-10-CM

## 2020-06-05 DIAGNOSIS — R2689 Other abnormalities of gait and mobility: Secondary | ICD-10-CM | POA: Diagnosis not present

## 2020-06-05 NOTE — Therapy (Signed)
[facility] [address] [place], [place], [address] Phone: [phone]   Fax:  [phone]  Physical Therapy Treatment  Patient Details  Name: Rhonda Gardner MRN: [id number] Date of Birth: 07-Jan-1956 Referring Provider (PT): Delice Lesch, MD   Encounter Date: 06/05/2020   PT End of Session - 06/05/20 1916    Visit Number 13    Number of Visits 18    Date for PT Re-Evaluation 06/23/20    Authorization Type Medicare: PN every 10th visit.    Progress Note Due on Visit 20    PT Start Time 0933    PT Stop Time 1015    PT Time Calculation (min) 42 min    Activity Tolerance Patient tolerated treatment well    Behavior During Therapy WFL for tasks assessed/performed           Past Medical History:  Diagnosis Date  . Allergy 2006   chemical cauterization in spring 2006, and 2nd treatment with good results.(Dr.Shoemaker)  . Bipolar disorder ([place])   . Bleeding ulcer   . Blood transfusion without reported diagnosis 07/2017   Bleeding ulcer  . Breast cancer ([place]) 12/2014   invasive ductal carcinoma (LEFT)  . Breast cancer of upper-outer quadrant of left female breast ([place]) 12/22/2014  . Breast hematoma 12/19/2015   had left breast aspiration of 4cm hematoma-path indicates benign hematoma  . C. difficile colitis   . C. difficile diarrhea   . Depression   . 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 Osteoporosis 06/20/2016   T-2.5@spine  on Dexa ([facility])  . Plantar fasciitis   . PONV (postoperative nausea and vomiting)   . Symptomatic anemia 07/16/2017    Past Surgical History:  Procedure Laterality Date  . BIOPSY  07/03/2018   Procedure: BIOPSY;  Surgeon: Gatha Mayer, MD;  Location: [facility];  Service: Endoscopy;;  . BIOPSY  09/02/2018   Procedure: BIOPSY;  Surgeon: Gatha Mayer, MD;  Location: [facility];  Service: Endoscopy;;  . BIOPSY  10/22/2018    Procedure: BIOPSY;  Surgeon: Milus Banister, MD;  Location: [facility];  Service: Endoscopy;;  . BREAST BIOPSY Bilateral    benign and a right stereotatic breast biopsy.  . COLONOSCOPY    . ESOPHAGOGASTRODUODENOSCOPY N/A 07/16/2017   Procedure: ESOPHAGOGASTRODUODENOSCOPY (EGD);  Surgeon: Ladene Artist, MD;  Location: [facility] ENDOSCOPY;  Service: Endoscopy;  Laterality: N/A;  . ESOPHAGOGASTRODUODENOSCOPY N/A 10/22/2018   Procedure: ESOPHAGOGASTRODUODENOSCOPY (EGD);  Surgeon: Milus Banister, MD;  Location: [facility];  Service: Endoscopy;  Laterality: N/A;  . ESOPHAGOGASTRODUODENOSCOPY (EGD) WITH PROPOFOL N/A 07/03/2018   Procedure: ESOPHAGOGASTRODUODENOSCOPY (EGD) WITH PROPOFOL;  Surgeon: Gatha Mayer, MD;  Location: [facility];  Service: Endoscopy;  Laterality: N/A;  . ESOPHAGOGASTRODUODENOSCOPY (EGD) WITH PROPOFOL N/A 09/02/2018   Procedure: ESOPHAGOGASTRODUODENOSCOPY (EGD) WITH PROPOFOL;  Surgeon: Gatha Mayer, MD;  Location: [facility];  Service: Endoscopy;  Laterality: N/A;  . EUS N/A 10/22/2018   Procedure: UPPER ENDOSCOPIC ULTRASOUND (EUS) RADIAL;  Surgeon: Milus Banister, MD;  Location: [facility];  Service: Endoscopy;  Laterality: N/A;  . fibroid excision  age 17   prolapsed fibroid removed  . FINE NEEDLE ASPIRATION  10/22/2018   Procedure: FINE NEEDLE ASPIRATION;  Surgeon: Milus Banister, MD;  Location: [facility];  Service: Endoscopy;;  . FOOT SURGERY Left 2014   Dr. Paulla Dolly  . FOOT SURGERY Right 08/2016   Dr. Paulla Dolly --Shortening Osteotomy with Fixation 1st  Metatarsal   . MOUTH SURGERY  06/2017   [facility]   . PORT-A-CATH REMOVAL N/A 02/15/2016   Procedure: REMOVAL PORT-A-CATH;  Surgeon: Autumn Messing III, MD;  Location: [facility];  Service: General;  Laterality: N/A;  . RADIOACTIVE SEED GUIDED PARTIAL MASTECTOMY WITH AXILLARY SENTINEL LYMPH NODE BIOPSY Left 05/25/2015   Procedure: LEFT BREAST LUMPECTOMY WITH RADIOACTIVE SEED AND SENTINEL LYMPH NODE BIOPSY;   Surgeon: Autumn Messing III, MD;  Location: [facility];  Service: General;  Laterality: Left;  . UPPER GASTROINTESTINAL ENDOSCOPY      There were no vitals filed for this visit.   Subjective Assessment - 06/05/20 1858    Subjective Pt states she is doing better; reports she is doing letter exercise (x1 viewing on a glass door to the outdoors so she has objects in the background); is able to do for 1 minute with slight increase in dizziness    Pertinent History Hx of L breast CA (**no BP in LUE) s/p chemo/radiation, hx of lymphoma, foot surgery (bunions), depression, bipolar disorder, osteoporosis, plantar fasciitis, anemia    Patient Stated Goals For my head to clear up and for my balance to get where it was before.    Currently in Pain? No/denies                 NeuroRe-ed; pt performed trunk rotations standing in corner on Airex - touching each wall straight across 5 reps each with EO  And then with EC; diagonals  "X" with EO and then with EC 5 reps each  Marching on incline with EO, then EC; EO with horizontal head turns, vertical head turns; EC with horizontal head turns then With vertical head turns 5 reps each with CGA             [facility] Adult PT Treatment/Exercise - 06/05/20 0001      Ambulation/Gait   Ambulation/Gait Yes    Ambulation/Gait Assistance 5: Supervision    Ambulation Distance (Feet) 40 Feet    Assistive device None    Gait Pattern Within Functional Limits    Ambulation Surface Level;Indoor    Gait Comments amb. with abrupt stop and turn and with abrupt turn and stop      High Level Balance   High Level Balance Activities Backward walking;Turns;Marching forwards;Marching backwards   performed inside // bars but no UE support used          Vestibular Treatment/Exercise - 06/05/20 0952      Vestibular Treatment/Exercise   Vestibular Treatment Provided Gaze    Gaze Exercises X1 Viewing Horizontal;X1 Viewing Vertical      Standing  Horizontal Head Turns   Number of Reps  1    Symptom Description  slight unsteadiness noted as pt performed x1 viewing on plain background standing on Airex with chair in front for UE support  prn      Standing Vertical Head Turns   Number of Reps  1    Symptom Description  > unsteadiness noted with vertical head turns with perfoming this ex. standing on Airex pad- chair in front for support       X1 Viewing Horizontal   Foot Position bil. stance     Time --   60 secs   Reps 1      X1 Viewing Vertical   Foot Position bil. stance    Time --   60 secs   Reps 1  Balance Exercises - 06/05/20 0001      Balance Exercises: Standing   Standing Eyes Closed Narrow base of support (BOS);Wide (BOA);Head turns;Foam/compliant surface;5 reps    Rockerboard Anterior/posterior;Head turns;EO;EC;10 reps;Intermittent UE support   10 reps - horizontal and vertical head turns   Balance Beam standing perpendicular on blue foam balance beam with head turns horizontal and vertical with UE support on // bars prn; standing in partial tandem stance on foam balance beam with UE support - with slow head turns side to side and up/dwon 5 reps each     Marching Solid surface;Head turns;Dynamic;Static;Forwards;Retro   4 reps forward/back inside // bars             PT Education - 06/05/20 1915    Education Details progressed x1 viewing exercise from standing on floor to standing on pillows for compliant surface training    Person(s) Educated Patient    Methods Explanation;Demonstration    Comprehension Verbalized understanding;Returned demonstration            PT Short Term Goals - 06/05/20 1930      PT SHORT TERM GOAL #1   Title = LTG for 4 week POC             PT Long Term Goals - 06/05/20 1931      PT LONG TERM GOAL #1   Title Pt will progress with walking program to address symptoms with exertion by reporting ability to ambulate x 30 minutes outside on uneven pavement at  moderate pace (20-23 minute mile)    Time 4    Period Weeks    Status New      PT LONG TERM GOAL #2   Title Pt will demonstrate independence with progressed HEP    Time 4    Period Weeks    Status Revised      PT LONG TERM GOAL #3   Title Pt will report 1/5 dizziness overall with all movements on MSQ to indicate decreased motion sensitivity    Time 4    Period Weeks    Status New      PT LONG TERM GOAL #4   Title Pt will improve FGA score to 29/30 to decr. falls risk.    Baseline 27/30  >> 28/30    Time 4    Period Weeks    Status Revised      PT LONG TERM GOAL #5   Title Pt will demonstrate improved use of somatosensory information for standing balance as indicated on SOT by increase in [facility] to >95% on sensory analysis (will maintain composite score of >77)    Baseline Sensory analysis for SOM: 90% - no change in somatosensory    Time 4    Period Weeks    Status Revised                 Plan - 06/05/20 1916    Clinical Impression Statement Pt tolerated progression of x1 viewing exercise from standing on floor to standing on compliant surface with minimal unsteadiness with only intermittent UE support needed; pt reported more dizziness with vertical head turns than with horizontal head turns. Pt is progressing well towards goals. Pt reported only slight increase in dizziness at end of session compared to start of session.    Personal Factors and Comorbidities Age;Comorbidity 3+;Past/Current Experience;Time since onset of injury/illness/exacerbation    Comorbidities Hx of L breast CA (**no BP in LUE) s/p chemo/radiation, hx of lymphoma, foot surgery (bunions),  depression, bipolar disorder, osteoporosis, plantar fasciitis, anemia    Examination-Activity Limitations Bathing;Locomotion Level;Stairs;Transfers;Caring for Others   pt's sister will visit pt soon and she has to assist her sister to the bathroom and she's fearful of the steps   Examination-Participation Restrictions  Cleaning;Yard Work;Interpersonal Relationship    Stability/Clinical Decision Making Evolving/Moderate complexity    Rehab Potential Good    PT Frequency 2x / week    PT Duration 4 weeks    PT Treatment/Interventions ADLs/Self Care Home Management;Aquatic Therapy;Biofeedback;Canalith Repostioning;DME Instruction;Balance training;Neuromuscular re-education;Manual techniques;Therapeutic exercise;Therapeutic activities;Functional mobility training;Patient/family education;Stair training;Gait training;Cognitive remediation;Vestibular;Cryotherapy;Moist Heat;Passive range of motion    PT Next Visit Plan **NO BP IN LUE**   Check x1 viewing ex on compliant surface as progression given for HEP:  cont Balance with eyes closed and walking with head turns.    PT Home Exercise Plan x1 viewing and corner/counter balance [email or web address] in [employer]    Consulted and Agree with Plan of Care Patient           Patient will benefit from skilled therapeutic intervention in order to improve the following deficits and impairments:  Abnormal gait, Dizziness, Decreased activity tolerance, Decreased knowledge of use of DME, Decreased balance, Decreased cognition, Decreased mobility, Decreased strength, Impaired sensation, Impaired flexibility  Visit Diagnosis: Unsteadiness on feet  Dizziness and giddiness     Problem List Patient Active Problem List   Diagnosis Date Noted  . Cognitive deficits 05/16/2020  . Visual disturbance 05/16/2020  . Sleep disturbance 04/04/2020  . Post concussive syndrome 03/02/2020  . Dizziness and giddiness 03/02/2020  . Follicular lymphoma ([place]) 11/16/2018  . Duodenum disorder   . Upper gastrointestinal bleed 07/02/2018  . Abdominal pain, epigastric 07/01/2018  . Elevated lipase 07/01/2018  . Elevated LFTs 07/01/2018  . Iron deficiency anemia 08/17/2017  . C. difficile colitis 08/17/2017  . Dehydration 08/17/2017  . Hypokalemia 08/17/2017  . Symptomatic anemia 07/16/2017  .  Bipolar 1 disorder ([place]) 07/16/2017  . Hypotension due to hypovolemia 07/16/2017  . Melena   . Heme positive stool   . Chronic duodenal ulcer with hemorrhage   . Blood transfusion without reported diagnosis 07/14/2017  . Osteoporosis 07/29/2016  . Genetic counseling and testing 01/29/2016  . Pain in joint, pelvic region and thigh 06/12/2015  . 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 Allergy to adhesive tape 01/25/2015  . Malignant neoplasm of upper-outer quadrant of left breast in female, estrogen receptor negative ([place]) 12/22/2014    Tonya Carlile, [place], PT 06/05/2020, 7:32 PM  [facility] [address] [place], [place], [address] Phone: [phone]   Fax:  [phone]  Name: Rhonda Gardner MRN: [id number] Date of Birth: 1956/09/04

## 2020-06-07 ENCOUNTER — Ambulatory Visit: Payer: Medicare Other | Admitting: Physical Therapy

## 2020-06-07 ENCOUNTER — Encounter: Payer: Self-pay | Admitting: Physical Therapy

## 2020-06-07 ENCOUNTER — Other Ambulatory Visit: Payer: Self-pay

## 2020-06-07 DIAGNOSIS — R2689 Other abnormalities of gait and mobility: Secondary | ICD-10-CM

## 2020-06-07 DIAGNOSIS — R42 Dizziness and giddiness: Secondary | ICD-10-CM

## 2020-06-07 DIAGNOSIS — R208 Other disturbances of skin sensation: Secondary | ICD-10-CM

## 2020-06-07 DIAGNOSIS — R2681 Unsteadiness on feet: Secondary | ICD-10-CM

## 2020-06-07 NOTE — Patient Instructions (Addendum)
Walking Program: - Slower pace - 20-30 minutes is your current baseline - 2x/week  - 2 times a week try to walk at a slightly faster pace for 15 minutes  (this is a challenge but I can keep going; mild dizziness)  Gaze Stabilization: Standing Feet Together (Compliant Surface)    Letter on busy background.  Feet together on pillow, keeping eyes on target on wall __3__ feet away, tilt head down 15-30 and move head side to side for __30__ seconds. Repeat while moving head up and down for __60__ seconds. Do __2__ sessions per day.   Access Code: [email or web address] URL: [email or web address] Date: 06/07/2020 Prepared by: Misty Stanley  Exercises Walking with Head Rotation - 1 x daily - 5 x weekly - 1 sets - 4 reps Walking with Eyes Closed and Counter Support - 1 x daily - 5 x weekly - 1 sets - 4 reps Romberg Stance Eyes Closed on Foam Pad - 1 x daily - 5 x weekly - 1 sets - 3 reps - 30 second hold Romberg Stance with Head Nods on Foam Pad - 1 x daily - 5 x weekly - 2 sets - 10 reps

## 2020-06-07 NOTE — Therapy (Signed)
[facility] [address] [place], [place], [address] Phone: [phone]   Fax:  [phone]  Physical Therapy Treatment  Patient Details  Name: Rhonda Gardner MRN: [id number] Date of Birth: Jul 17, 1956 Referring Provider (PT): Delice Lesch, MD   Encounter Date: 06/07/2020   PT End of Session - 06/07/20 1551    Visit Number 14    Number of Visits 18    Date for PT Re-Evaluation 06/23/20    Authorization Type Medicare: PN every 10th visit.    Progress Note Due on Visit 20    PT Start Time 0935    PT Stop Time 1019    PT Time Calculation (min) 44 min    Activity Tolerance Patient tolerated treatment well    Behavior During Therapy [facility] for tasks assessed/performed           Past Medical History:  Diagnosis Date   Allergy 2006   chemical cauterization in spring 2006, and 2nd treatment with good results.(Dr.Shoemaker)   Bipolar disorder ([place])    Bleeding ulcer    Blood transfusion without reported diagnosis 07/2017   Bleeding ulcer   Breast cancer ([place]) 12/2014   invasive ductal carcinoma (LEFT)   Breast cancer of upper-outer quadrant of left female breast ([place]) 12/22/2014   Breast hematoma 12/19/2015   had left breast aspiration of 4cm hematoma-path indicates benign hematoma   C. difficile colitis    C. 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, treated at [facility] (radiation)   Osteoporosis 06/20/2016   T-2.5@spine  on Dexa ([facility])   Plantar fasciitis    PONV (postoperative nausea and vomiting)    Symptomatic anemia 07/16/2017    Past Surgical History:  Procedure Laterality Date   BIOPSY  07/03/2018   Procedure: BIOPSY;  Surgeon: Gatha Mayer, MD;  Location: [facility];  Service: Endoscopy;;   BIOPSY  09/02/2018   Procedure: BIOPSY;  Surgeon: Gatha Mayer, MD;  [facility]: [facility];  Service: Endoscopy;;   BIOPSY  10/22/2018    Procedure: BIOPSY;  Surgeon: Milus Banister, MD;  [facility]: [facility];  Service: Endoscopy;;   BREAST BIOPSY Bilateral    benign and a right stereotatic breast biopsy.   COLONOSCOPY     ESOPHAGOGASTRODUODENOSCOPY N/A 07/16/2017   Procedure: ESOPHAGOGASTRODUODENOSCOPY (EGD);  Surgeon: Ladene Artist, MD;  Location: [facility] ENDOSCOPY;  Service: Endoscopy;  Laterality: N/A;   ESOPHAGOGASTRODUODENOSCOPY N/A 10/22/2018   Procedure: ESOPHAGOGASTRODUODENOSCOPY (EGD);  Surgeon: Milus Banister, MD;  Location: [facility];  Service: Endoscopy;  Laterality: N/A;   ESOPHAGOGASTRODUODENOSCOPY (EGD) WITH PROPOFOL N/A 07/03/2018   Procedure: ESOPHAGOGASTRODUODENOSCOPY (EGD) WITH PROPOFOL;  Surgeon: Gatha Mayer, MD;  [facility];  Service: Endoscopy;  Laterality: N/A;   ESOPHAGOGASTRODUODENOSCOPY (EGD) WITH PROPOFOL N/A 09/02/2018   Procedure: ESOPHAGOGASTRODUODENOSCOPY (EGD) WITH PROPOFOL;  Surgeon: Gatha Mayer, MD;  [facility];  Service: Endoscopy;  Laterality: N/A;   EUS N/A 10/22/2018   Procedure: UPPER ENDOSCOPIC ULTRASOUND (EUS) RADIAL;  Surgeon: Milus Banister, MD;  [facility];  Service: Endoscopy;  Laterality: N/A;   fibroid excision  age 66   prolapsed fibroid removed   FINE NEEDLE ASPIRATION  10/22/2018   Procedure: FINE NEEDLE ASPIRATION;  Surgeon: Milus Banister, MD;  [facility];  Service: Endoscopy;;   FOOT SURGERY Left 2014   Dr. Paulla Dolly   FOOT SURGERY Right 08/2016   Dr. Paulla Dolly --Shortening Osteotomy with Fixation 1st  Metatarsal    MOUTH SURGERY  06/2017   CYST    PORT-A-CATH REMOVAL N/A 02/15/2016   Procedure: REMOVAL PORT-A-CATH;  Surgeon: Autumn Messing III, MD;  Location: [facility];  Service: General;  Laterality: N/A;   RADIOACTIVE SEED GUIDED PARTIAL MASTECTOMY WITH AXILLARY SENTINEL LYMPH NODE BIOPSY Left 05/25/2015   Procedure: LEFT BREAST LUMPECTOMY WITH RADIOACTIVE SEED AND SENTINEL LYMPH NODE BIOPSY;   Surgeon: Autumn Messing III, MD;  Location: [facility];  Service: General;  Laterality: Left;   UPPER GASTROINTESTINAL ENDOSCOPY      There were no vitals filed for this visit.   Subjective Assessment - 06/07/20 0938    Subjective Pt reports feeling bad after Monday's session and felt bad the rest of the day; felt a little better Tuesday and then a little better today.  Feels like exercises are too easy now. Still feels like head is in a fog.    Pertinent History Hx of L breast CA (**no BP in LUE) s/p chemo/radiation, hx of lymphoma, foot surgery (bunions), depression, bipolar disorder, osteoporosis, plantar fasciitis, anemia    Patient Stated Goals For my head to clear up and for my balance to get where it was before.                 Vestibular Treatment/Exercise - 06/07/20 0947      Vestibular Treatment/Exercise   Vestibular Treatment Provided Gaze    Gaze Exercises X1 Viewing Horizontal;X1 Viewing Vertical      X1 Viewing Horizontal   Foot Position feet together on a pillow    Reps 2    Comments 30 seconds, mild dizziness, one episode of LOB when stopping      X1 Viewing Vertical   Foot Position feet together on a pillow    Reps 2    Comments 30 seconds > 60 seconds due to no symptoms with 30 seconds.  Mild symptoms with 60 seconds           Walking Program: - Slower pace - 20-30 minutes is your current baseline - 2x/week  - 2 times a week try to walk at a slightly faster pace for 15 minutes  (this is a challenge but I can keep going; mild dizziness)  Gaze Stabilization: Standing Feet Together (Compliant Surface)    Letter on busy background.  Feet together on pillow, keeping eyes on target on wall __3__ feet away, tilt head down 15-30 and move head side to side for __30__ seconds. Repeat while moving head up and down for __60__ seconds. Do __2__ sessions per day.   Access Code: [email or web address] URL: [email or web address] Date:  06/07/2020 Prepared by: Misty Stanley  Exercises Walking with Head Rotation - 1 x daily - 5 x weekly - 1 sets - 4 reps - FORWARDS AND BACKWARDS Walking with Eyes Closed and Counter Support - 1 x daily - 5 x weekly - 1 sets - 4 reps - FORWARDS AND BACKWARDS SLOWLY Romberg Stance Eyes Closed on Foam Pad - 1 x daily - 5 x weekly - 1 sets - 3 reps - 30 second hold Romberg Stance with Head Nods on Foam Pad - 1 x daily - 5 x weekly - 2 sets - 10 reps       PT Education - 06/07/20 1551    Education Details only minor changes to HEP due to continued challenge and symptoms    Person(s) Educated Patient    Methods Explanation;Demonstration;Handout    Comprehension Verbalized understanding;Returned  demonstration            PT Short Term Goals - 06/05/20 1930      PT SHORT TERM GOAL #1   Title = LTG for 4 week POC             PT Long Term Goals - 06/07/20 1600      PT LONG TERM GOAL #1   Title Pt will progress with walking program to address symptoms with exertion by reporting ability to ambulate x 30 minutes outside on uneven pavement at moderate pace (20-23 minute mile)    Time 4    Period Weeks    Status New    Target Date 06/23/20      PT LONG TERM GOAL #2   Title Pt will demonstrate independence with progressed HEP    Time 4    Period Weeks    Status Revised    Target Date 06/23/20      PT LONG TERM GOAL #3   Title Pt will report 1/5 dizziness overall with all movements on MSQ to indicate decreased motion sensitivity    Time 4    Period Weeks    Status New    Target Date 06/23/20      PT LONG TERM GOAL #4   Title Pt will improve FGA score to 29/30 to decr. falls risk.    Baseline 27/30  >> 28/30    Time 4    Period Weeks    Status Revised    Target Date 06/23/20      PT LONG TERM GOAL #5   Title Pt will demonstrate improved use of somatosensory information for standing balance as indicated on SOT by increase in SOM to >95% on sensory analysis (will maintain  composite score of >77)    Baseline Sensory analysis for SOM: 90% - no change in somatosensory    Time 4    Period Weeks    Status Revised    Target Date 06/23/20                 Plan - 06/07/20 1552    Clinical Impression Statement Patient reporting HEP is getting easier; when reviewing HEP today pt continues to demonstrate mild-moderate symptoms and imbalance with x1 viewing, corner balance, and walking with eyes closed.  Kept x1 viewing and corner balance the same.  For eyes closed focused on slowing down patient's forwards and backwards velocity to improve control and use of balance strategies.  Pt has made progress with walking with head turns; able to add backwards walking.  Will continue to progress towards LTG.    Personal Factors and Comorbidities Age;Comorbidity 3+;Past/Current Experience;Time since onset of injury/illness/exacerbation    Comorbidities Hx of L breast CA (**no BP in LUE) s/p chemo/radiation, hx of lymphoma, foot surgery (bunions), depression, bipolar disorder, osteoporosis, plantar fasciitis, anemia    Examination-Activity Limitations Bathing;Locomotion Level;Stairs;Transfers;Caring for Others   pt's sister will visit pt soon and she has to assist her sister to the bathroom and she's fearful of the steps   Examination-Participation Restrictions Cleaning;Yard Work;Interpersonal Relationship    Stability/Clinical Decision Making Evolving/Moderate complexity    Rehab Potential Good    PT Frequency 2x / week    PT Duration 4 weeks    PT Treatment/Interventions ADLs/Self Care Home Management;Aquatic Therapy;Biofeedback;Canalith Repostioning;DME Instruction;Balance training;Neuromuscular re-education;Manual techniques;Therapeutic exercise;Therapeutic activities;Functional mobility training;Patient/family education;Stair training;Gait training;Cognitive remediation;Vestibular;Cryotherapy;Moist Heat;Passive range of motion    PT Next Visit Plan **NO BP IN LUE**  Progress  time with x1 viewing; focus on faster walking speeds.  Balance on compliant surfaces.    PT Home Exercise Plan x1 viewing and corner/counter balance [email or web address] in Medbridge    Consulted and Agree with Plan of Care Patient           Patient will benefit from skilled therapeutic intervention in order to improve the following deficits and impairments:  Abnormal gait, Dizziness, Decreased activity tolerance, Decreased knowledge of use of DME, Decreased balance, Decreased cognition, Decreased mobility, Decreased strength, Impaired sensation, Impaired flexibility  Visit Diagnosis: Unsteadiness on feet  Dizziness and giddiness  Other abnormalities of gait and mobility  Other disturbances of skin sensation     Problem List Patient Active Problem List   Diagnosis Date Noted   Cognitive deficits 05/16/2020   Visual disturbance 05/16/2020   Sleep disturbance 04/04/2020   Post concussive syndrome 03/02/2020   Dizziness and giddiness 14/07/3012   Follicular lymphoma ([place]) 11/16/2018   Duodenum disorder    Upper gastrointestinal bleed 07/02/2018   Abdominal pain, epigastric 07/01/2018   Elevated lipase 07/01/2018   Elevated LFTs 07/01/2018   Iron deficiency anemia 08/17/2017   C. 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 [place], [place], [address] Phone: [phone]   Fax:  [phone]  Name: Rhonda Gardner MRN: [id number] Date of Birth: [birth year]

## 2020-06-12 ENCOUNTER — Ambulatory Visit: Payer: Medicare Other | Admitting: Physical Therapy

## 2020-06-12 ENCOUNTER — Encounter: Payer: Self-pay | Admitting: Physical Therapy

## 2020-06-12 ENCOUNTER — Other Ambulatory Visit: Payer: Self-pay

## 2020-06-12 DIAGNOSIS — R2689 Other abnormalities of gait and mobility: Secondary | ICD-10-CM | POA: Diagnosis not present

## 2020-06-12 DIAGNOSIS — R2681 Unsteadiness on feet: Secondary | ICD-10-CM

## 2020-06-12 DIAGNOSIS — R42 Dizziness and giddiness: Secondary | ICD-10-CM

## 2020-06-12 NOTE — Therapy (Signed)
[facility] [address] [place], [place], [address] Phone: [phone]   Fax:  [phone]  Physical Therapy Treatment  Patient Details  Name: Rhonda Gardner MRN: [id number] Date of Birth: 02/16/1956 Referring Provider (PT): Delice Lesch, MD   Encounter Date: 06/12/2020   PT End of Session - 06/12/20 2035    Visit Number 15    Number of Visits 18    Date for PT Re-Evaluation 06/23/20    Authorization Type Medicare: PN every 10th visit.    Progress Note Due on Visit 20    PT Start Time 0932    PT Stop Time 1015    PT Time Calculation (min) 43 min    Activity Tolerance Patient tolerated treatment well    Behavior During Therapy [facility] for tasks assessed/performed           Past Medical History:  Diagnosis Date  . Allergy 2006   chemical cauterization in spring 2006, and 2nd treatment with good results.(Dr.Shoemaker)  . Bipolar disorder ([place])   . Bleeding ulcer   . Blood transfusion without reported diagnosis 07/2017   Bleeding ulcer  . Breast cancer ([place]) 12/2014   invasive ductal carcinoma (LEFT)  . Breast cancer of upper-outer quadrant of left female breast ([place]) 12/22/2014  . Breast hematoma 12/19/2015   had left breast aspiration of 4cm hematoma-path indicates benign hematoma  . C. difficile colitis   . C. difficile diarrhea   . Depression   . 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 Osteoporosis 06/20/2016   T-2.5@spine  on Dexa ([facility])  . Plantar fasciitis   . PONV (postoperative nausea and vomiting)   . Symptomatic anemia 07/16/2017    Past Surgical History:  Procedure Laterality Date  . BIOPSY  07/03/2018   Procedure: BIOPSY;  Surgeon: Gatha Mayer, MD;  Location: [facility];  Service: Endoscopy;;  . BIOPSY  09/02/2018   Procedure: BIOPSY;  Surgeon: Gatha Mayer, MD;  [facility];  Service: Endoscopy;;  . BIOPSY  10/22/2018    Procedure: BIOPSY;  Surgeon: Milus Banister, MD;  [facility];  Service: Endoscopy;;  . BREAST BIOPSY Bilateral    benign and a right stereotatic breast biopsy.  . COLONOSCOPY    . ESOPHAGOGASTRODUODENOSCOPY N/A 07/16/2017   Procedure: ESOPHAGOGASTRODUODENOSCOPY (EGD);  Surgeon: Ladene Artist, MD;  Location[facility] END[facility]COPY;  Service: Endoscopy;  Laterality: N/A;  . ESOPHAGOGASTRODUODENOSCOPY N/A 10/22/2018   Procedure: ESOPHAGOGASTRODUODENOSCOPY (EGD);  Surgeon: Milus Banister, MD;  Location: [facility];  Service: Endoscopy;  Laterality: N/A;  . ESOPHAGOGASTRODUODENOSCOPY (EGD) WITH PROPOFOL N/A 07/03/2018   Procedure: ESOPHAGOGASTRODUODENOSCOPY (EGD) WITH PROPOFOL;  Surgeon: Gatha Mayer, MD;  [facility];  Service: Endoscopy;  Laterality: N/A;  . ESOPHAGOGASTRODUODENOSCOPY (EGD) WITH PROPOFOL N/A 09/02/2018   Procedure: ESOPHAGOGASTRODUODENOSCOPY (EGD) WITH PROPOFOL;  Surgeon: Gatha Mayer, MD;  [facility];  Service: Endoscopy;  Laterality: N/A;  . EUS N/A 10/22/2018   Procedure: UPPER ENDOSCOPIC ULTRASOUND (EUS) RADIAL;  Surgeon: Milus Banister, MD;  [facility];  Service: Endoscopy;  Laterality: N/A;  . fibroid excision  age 80   prolapsed fibroid removed  . FINE NEEDLE ASPIRATION  10/22/2018   Procedure: FINE NEEDLE ASPIRATION;  Surgeon: Milus Banister, MD;  [facility];  Service: Endoscopy;;  . FOOT SURGERY Left 2014   Dr. Paulla Dolly  . FOOT SURGERY Right 08/2016   Dr. Paulla Dolly --Shortening Osteotomy with Fixation 1st  Metatarsal   . MOUTH SURGERY  06/2017   CYST   . PORT-A-CATH REMOVAL N/A 02/15/2016   Procedure: REMOVAL PORT-A-CATH;  Surgeon: Autumn Messing III, MD;  Location: [facility];  Service: General;  Laterality: N/A;  . RADIOACTIVE SEED GUIDED PARTIAL MASTECTOMY WITH AXILLARY SENTINEL LYMPH NODE BIOPSY Left 05/25/2015   Procedure: LEFT BREAST LUMPECTOMY WITH RADIOACTIVE SEED AND SENTINEL LYMPH NODE BIOPSY;   Surgeon: Autumn Messing III, MD;  Location: [facility];  Service: General;  Laterality: Left;  . UPPER GASTROINTESTINAL ENDOSCOPY      There were no vitals filed for this visit.   Subjective Assessment - 06/12/20 2021    Subjective Pt reports she feels a little "woozy" today; reports she is doing the gaze stabilization exercise standing on pillows for 30 secs with letter on transparent glass door to her backyard, so she is getting the patterned effect    Pertinent History Hx of L breast CA (**no BP in LUE) s/p chemo/radiation, hx of lymphoma, foot surgery (bunions), depression, bipolar disorder, osteoporosis, plantar fasciitis, anemia    Patient Stated Goals For my head to clear up and for my balance to get where it was before.    Currently in Pain? No/denies                   NeuroRe-ed:  Pt amb. 62' making circles clockwise with ball - 2 reps; counterclockwise with ball 2 reps (visuall tracking ball with  Eyes and head moving)  Pt performed marching in place on ramp (solid surface) on incline and then on decline; head turns with EO horizontal and vertical 5 reps each; marching with EC without head turns ; then marching with head turns EC horizontal and vertical 5 reps each with CGA To min assist for recovery of LOB   Pt amb. Around track 115' x 1 rep between activities prn  to allow time for symptoms to reduce/subside         Vestibular Treatment/Exercise - 06/12/20 0953      Vestibular Treatment/Exercise   Vestibular Treatment Provided Gaze    Gaze Exercises X1 Viewing Horizontal;X1 Viewing Vertical      X1 Viewing Horizontal   Foot Position feet approx. 6" apart on Airex - letter was placed on posture grid for patterned background;      Time --   30 secs   Reps 2    Comments no LOB occurred - pt reported feeling woozy after stopping but able to continue ex.      X1 Viewing Vertical   Foot Position feet approx. 6" apart    Time --   40 secs   Reps 2      Comments letter on posture grid for patterned background - pt stood on Airex; c/o wooziness upon completion of ex. but pt took short standing rest break and able to continue               Balance Exercises - 06/12/20 0001      Balance Exercises: Standing   Rockerboard Anterior/posterior;Head turns;EO;EC;5 reps   5 reps horizontal and vertical EO and EC   Other Standing Exercises Pt amb. approx. 40' x 2 reps tossing ball and catching (straight up in front) ; progressed to tossing ball on Rt/Lt sides 40' x 2 reps for habituation with turning and for improved gaze stabilization during gait              PT Education - 06/12/20 2033  Education Details pt instructed to increase time from 30 secs to 40 secs (as tolerated) for x1 viewing ex. with target on her transparent glass door, providing patterned background    Person(s) Educated Patient    Methods Explanation    Comprehension Verbalized understanding;Returned demonstration            PT Short Term Goals - 06/12/20 2040      PT SHORT TERM GOAL #1   Title = LTG for 4 week POC             PT Long Term Goals - 06/12/20 2041      PT LONG TERM GOAL #1   Title Pt will progress with walking program to address symptoms with exertion by reporting ability to ambulate x 30 minutes outside on uneven pavement at moderate pace (20-23 minute mile)    Time 4    Period Weeks    Status New      PT LONG TERM GOAL #2   Title Pt will demonstrate independence with progressed HEP    Time 4    Period Weeks    Status Revised      PT LONG TERM GOAL #3   Title Pt will report 1/5 dizziness overall with all movements on MSQ to indicate decreased motion sensitivity    Time 4    Period Weeks    Status New      PT LONG TERM GOAL #4   Title Pt will improve FGA score to 29/30 to decr. falls risk.    Baseline 27/30  >> 28/30    Time 4    Period Weeks    Status Revised      PT LONG TERM GOAL #5   Title Pt will demonstrate improved  use of somatosensory information for standing balance as indicated on SOT by increase in SOM to >95% on sensory analysis (will maintain composite score of >77)    Baseline Sensory analysis for SOM: 90% - no change in somatosensory    Time 4    Period Weeks    Status Revised                 Plan - 06/12/20 8546    Clinical Impression Statement Pt has most difficulty maintaining balance with activities with EC, however, she reports feeling woozy with oculomotor and gaze stabilization exercises but recovers quickly.  Pt required no seated rest breaks during PT session - only short standing rest breaks needed.  Pt able to amb. 115' around track focusing straight ahead to allow wooziness and c/o nausea to settle.  Pt is progressing towards goals.    Personal Factors and Comorbidities Age;Comorbidity 3+;Past/Current Experience;Time since onset of injury/illness/exacerbation    Comorbidities Hx of L breast CA (**no BP in LUE) s/p chemo/radiation, hx of lymphoma, foot surgery (bunions), depression, bipolar disorder, osteoporosis, plantar fasciitis, anemia    Examination-Activity Limitations Bathing;Locomotion Level;Stairs;Transfers;Caring for Others   pt's sister will visit pt soon and she has to assist her sister to the bathroom and she's fearful of the steps   Examination-Participation Restrictions Cleaning;Yard Work;Interpersonal Relationship    Stability/Clinical Decision Making Evolving/Moderate complexity    Rehab Potential Good    PT Frequency 2x / week    PT Duration 4 weeks    PT Treatment/Interventions ADLs/Self Care Home Management;Aquatic Therapy;Biofeedback;Canalith Repostioning;DME Instruction;Balance training;Neuromuscular re-education;Manual techniques;Therapeutic exercise;Therapeutic activities;Functional mobility training;Patient/family education;Stair training;Gait training;Cognitive remediation;Vestibular;Cryotherapy;Moist Heat;Passive range of motion    PT Next Visit Plan **NO  BP IN  LUE**  Balance on compliant surfaces, visual/vestibular exercises.    PT Home Exercise Plan x1 viewing and corner/counter balance [email or web address] in [employer]    Consulted and Agree with Plan of Care Patient           Patient will benefit from skilled therapeutic intervention in order to improve the following deficits and impairments:  Abnormal gait, Dizziness, Decreased activity tolerance, Decreased knowledge of use of DME, Decreased balance, Decreased cognition, Decreased mobility, Decreased strength, Impaired sensation, Impaired flexibility  Visit Diagnosis: Unsteadiness on feet  Dizziness and giddiness     Problem List Patient Active Problem List   Diagnosis Date Noted  . Cognitive deficits 05/16/2020  . Visual disturbance 05/16/2020  . Sleep disturbance 04/04/2020  . Post concussive syndrome 03/02/2020  . Dizziness and giddiness 03/02/2020  . Follicular lymphoma ([place]) 11/16/2018  . Duodenum disorder   . Upper gastrointestinal bleed 07/02/2018  . Abdominal pain, epigastric 07/01/2018  . Elevated lipase 07/01/2018  . Elevated LFTs 07/01/2018  . Iron deficiency anemia 08/17/2017  . C. difficile colitis 08/17/2017  . Dehydration 08/17/2017  . Hypokalemia 08/17/2017  . Symptomatic anemia 07/16/2017  . Bipolar 1 disorder ([place]) 07/16/2017  . Hypotension due to hypovolemia 07/16/2017  . Melena   . Heme positive stool   . Chronic duodenal ulcer with hemorrhage   . Blood transfusion without reported diagnosis 07/14/2017  . Osteoporosis 07/29/2016  . Genetic counseling and testing 01/29/2016  . Pain in joint, pelvic region and thigh 06/12/2015  . 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 Allergy to adhesive tape 01/25/2015  . Malignant neoplasm of upper-outer quadrant of left breast in female, estrogen receptor negative ([place]) 12/22/2014    Chryl Holten, [place], PT 06/12/2020, 8:42 PM  [facility] [address] [place], [place], [address] Phone: [phone]   Fax:  [phone]  Name: Rhonda Gardner MRN: [id number] Date of Birth: [birth year]

## 2020-06-14 ENCOUNTER — Ambulatory Visit: Payer: Medicare Other | Attending: Physical Medicine & Rehabilitation | Admitting: Physical Therapy

## 2020-06-14 ENCOUNTER — Encounter: Payer: Self-pay | Admitting: Physical Therapy

## 2020-06-14 ENCOUNTER — Other Ambulatory Visit: Payer: Self-pay

## 2020-06-14 DIAGNOSIS — R2681 Unsteadiness on feet: Secondary | ICD-10-CM | POA: Diagnosis present

## 2020-06-14 DIAGNOSIS — R2689 Other abnormalities of gait and mobility: Secondary | ICD-10-CM

## 2020-06-14 DIAGNOSIS — R209 Unspecified disturbances of skin sensation: Secondary | ICD-10-CM | POA: Diagnosis present

## 2020-06-14 DIAGNOSIS — R208 Other disturbances of skin sensation: Secondary | ICD-10-CM

## 2020-06-14 DIAGNOSIS — R42 Dizziness and giddiness: Secondary | ICD-10-CM | POA: Insufficient documentation

## 2020-06-14 NOTE — Therapy (Signed)
[facility] [address] [place], [place], [address] Phone: [phone]   Fax:  [phone]  Physical Therapy Treatment  Patient Details  Name: Rhonda Gardner MRN: [id number] Date of Birth: [birth year] Referring Provider (PT): Delice Lesch, MD   Encounter Date: 06/14/2020   PT End of Session - 06/14/20 1113    Visit Number 16    Number of Visits 18    Date for PT Re-Evaluation 06/23/20    Authorization Type Medicare: PN every 10th visit.    Progress Note Due on Visit 20    PT Start Time 1022    PT Stop Time 1102    PT Time Calculation (min) 40 min    Activity Tolerance Patient tolerated treatment well    Behavior During Therapy WFL for tasks assessed/performed           Past Medical History:  Diagnosis Date  . Allergy 2006   chemical cauterization in spring 2006, and 2nd treatment with good results.(Dr.Shoemaker)  . Bipolar disorder ([place])   . Bleeding ulcer   . Blood transfusion without reported diagnosis 07/2017   Bleeding ulcer  . Breast cancer ([place]) 12/2014   invasive ductal carcinoma (LEFT)  . Breast cancer of upper-outer quadrant of left female breast ([place]) 12/22/2014  . Breast hematoma 12/19/2015   had left breast aspiration of 4cm hematoma-path indicates benign hematoma  . C. difficile colitis   . C. difficile diarrhea   . Depression   . 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 Osteoporosis 06/20/2016   T-2.5@spine  on Dexa ([facility])  . Plantar fasciitis   . PONV (postoperative nausea and vomiting)   . Symptomatic anemia 07/16/2017    Past Surgical History:  Procedure Laterality Date  . BIOPSY  07/03/2018   Procedure: BIOPSY;  Surgeon: Gatha Mayer, MD;  Location: [facility];  Service: Endoscopy;;  . BIOPSY  09/02/2018   Procedure: BIOPSY;  Surgeon: Gatha Mayer, MD;  Location: [facility];  Service: Endoscopy;;  . BIOPSY  10/22/2018    Procedure: BIOPSY;  Surgeon: Milus Banister, MD;  Location: [facility];  Service: Endoscopy;;  . BREAST BIOPSY Bilateral    benign and a right stereotatic breast biopsy.  . COLONOSCOPY    . ESOPHAGOGASTRODUODENOSCOPY N/A 07/16/2017   Procedure: ESOPHAGOGASTRODUODENOSCOPY (EGD);  Surgeon: Ladene Artist, MD;  Location: [facility] ENDOSCOPY;  Service: Endoscopy;  Laterality: N/A;  . ESOPHAGOGASTRODUODENOSCOPY N/A 10/22/2018   Procedure: ESOPHAGOGASTRODUODENOSCOPY (EGD);  Surgeon: Milus Banister, MD;  Location: [facility];  Service: Endoscopy;  Laterality: N/A;  . ESOPHAGOGASTRODUODENOSCOPY (EGD) WITH PROPOFOL N/A 07/03/2018   Procedure: ESOPHAGOGASTRODUODENOSCOPY (EGD) WITH PROPOFOL;  Surgeon: Gatha Mayer, MD;  Location: [facility];  Service: Endoscopy;  Laterality: N/A;  . ESOPHAGOGASTRODUODENOSCOPY (EGD) WITH PROPOFOL N/A 09/02/2018   Procedure: ESOPHAGOGASTRODUODENOSCOPY (EGD) WITH PROPOFOL;  Surgeon: Gatha Mayer, MD;  Location: [facility];  Service: Endoscopy;  Laterality: N/A;  . EUS N/A 10/22/2018   Procedure: UPPER ENDOSCOPIC ULTRASOUND (EUS) RADIAL;  Surgeon: Milus Banister, MD;  Location: [facility];  Service: Endoscopy;  Laterality: N/A;  . fibroid excision  age 12   prolapsed fibroid removed  . FINE NEEDLE ASPIRATION  10/22/2018   Procedure: FINE NEEDLE ASPIRATION;  Surgeon: Milus Banister, MD;  Location: [facility];  Service: Endoscopy;;  . FOOT SURGERY Left 2014   Dr. Paulla Dolly  . FOOT SURGERY Right 08/2016   Dr. Paulla Dolly --Shortening Osteotomy with Fixation 1st  Metatarsal   . MOUTH SURGERY  06/2017   [facility]   . PORT-A-CATH REMOVAL N/A 02/15/2016   Procedure: REMOVAL PORT-A-CATH;  Surgeon: Autumn Messing III, MD;  Location: [facility];  Service: General;  Laterality: N/A;  . RADIOACTIVE SEED GUIDED PARTIAL MASTECTOMY WITH AXILLARY SENTINEL LYMPH NODE BIOPSY Left 05/25/2015   Procedure: LEFT BREAST LUMPECTOMY WITH RADIOACTIVE SEED AND SENTINEL LYMPH NODE BIOPSY;   Surgeon: Autumn Messing III, MD;  Location: [facility];  Service: General;  Laterality: Left;  . UPPER GASTROINTESTINAL ENDOSCOPY      There were no vitals filed for this visit.   Subjective Assessment - 06/14/20 1024    Subjective Wooziness is no more than usual today.  No nausea today, had nausea on Monday but just slight.  Not able to sustain increased pace for full amount of time - is able to sustain 30-40% of the time she is walking.  Walking backwards with eyes closed is hard, compliant surfaces are hard.    Pertinent History Hx of L breast CA (**no BP in LUE) s/p chemo/radiation, hx of lymphoma, foot surgery (bunions), depression, bipolar disorder, osteoporosis, plantar fasciitis, anemia    Patient Stated Goals For my head to clear up and for my balance to get where it was before.    Currently in Pain? No/denies                             [facility] Adult PT Treatment/Exercise - 06/14/20 1029      Exercises   Exercises Other Exercises    Other Exercises  Treadmill -  1.4 mph (comfortable speed) forwards, 0 elevation while performing head turns in various directions with supervision-min A due to intermittent LOB x 6 minutes without UE support.  Increased speed to 2.5 mph and gradually increased incline to 5% but performed without head movements x 6 minutes without UE support.  Reported a little wooziness after walking faster.  Then performed walking backwards on treadmill at 0.4 mph focusing on taking longer strides with walking backwards.                 Balance Exercises - 06/14/20 1058      Balance Exercises: Standing   Retro Gait 4 reps;Upper extremity support;Limitations    Retro Gait Limitations with EC, cues to slow speed and increase step length and to allow pt to self monitor and self correct veering.  Mild wooziness.             PT Education - 06/14/20 1112    Education Details encouraged to continue to increase gait velocity with walking  program and use of walking poles to increase stability and gait speed, cues to adjust retro gait for HEP    Person(s) Educated Patient    Methods Explanation;Demonstration    Comprehension Verbalized understanding;Returned demonstration            PT Short Term Goals - 06/12/20 2040      PT SHORT TERM GOAL #1   Title = LTG for 4 week POC             PT Long Term Goals - 06/12/20 2041      PT LONG TERM GOAL #1   Title Pt will progress with walking program to address symptoms with exertion by reporting ability to ambulate x 30 minutes outside on uneven pavement at moderate pace (20-23 minute mile)    Time 4  Period Weeks    Status New      PT LONG TERM GOAL #2   Title Pt will demonstrate independence with progressed HEP    Time 4    Period Weeks    Status Revised      PT LONG TERM GOAL #3   Title Pt will report 1/5 dizziness overall with all movements on MSQ to indicate decreased motion sensitivity    Time 4    Period Weeks    Status New      PT LONG TERM GOAL #4   Title Pt will improve FGA score to 29/30 to decr. falls risk.    Baseline 27/30  >> 28/30    Time 4    Period Weeks    Status Revised      PT LONG TERM GOAL #5   Title Pt will demonstrate improved use of somatosensory information for standing balance as indicated on SOT by increase in [facility] to >95% on sensory analysis (will maintain composite score of >77)    Baseline Sensory analysis for SOM: 90% - no change in somatosensory    Time 4    Period Weeks    Status Revised                 Plan - 06/14/20 1113    Clinical Impression Statement Treatment session today focused on walking at continuous speeds with use of treadmill changing from comfortable speed with head movements, faster gait speed adding in inclines and then walking backwards with increased strides.  Pt reporting increased disequilibrium with faster walking speeds and retro gait.  Encouraged pt to continue to progress with HEP.     Personal Factors and Comorbidities Age;Comorbidity 3+;Past/Current Experience;Time since onset of injury/illness/exacerbation    Comorbidities Hx of L breast CA (**no BP in LUE) s/p chemo/radiation, hx of lymphoma, foot surgery (bunions), depression, bipolar disorder, osteoporosis, plantar fasciitis, anemia    Examination-Activity Limitations Bathing;Locomotion Level;Stairs;Transfers;Caring for Others   pt's sister will visit pt soon and she has to assist her sister to the bathroom and she's fearful of the steps   Examination-Participation Restrictions Cleaning;Yard Work;Interpersonal Relationship    Stability/Clinical Decision Making Evolving/Moderate complexity    Rehab Potential Good    PT Frequency 2x / week    PT Duration 4 weeks    PT Treatment/Interventions ADLs/Self Care Home Management;Aquatic Therapy;Biofeedback;Canalith Repostioning;DME Instruction;Balance training;Neuromuscular re-education;Manual techniques;Therapeutic exercise;Therapeutic activities;Functional mobility training;Patient/family education;Stair training;Gait training;Cognitive remediation;Vestibular;Cryotherapy;Moist Heat;Passive range of motion    PT Next Visit Plan **NO BP IN LUE**  Check LTG: recert or ready for D/C??   Balance on compliant surfaces, visual/vestibular exercises, faster walking speeds - on inclines, grass; retro gait    PT Home Exercise Plan x1 viewing and corner/counter balance [id number] in [employer]    Consulted and Agree with Plan of Care Patient           Patient will benefit from skilled therapeutic intervention in order to improve the following deficits and impairments:  Abnormal gait, Dizziness, Decreased activity tolerance, Decreased knowledge of use of DME, Decreased balance, Decreased cognition, Decreased mobility, Decreased strength, Impaired sensation, Impaired flexibility  Visit Diagnosis: Unsteadiness on feet  Dizziness and giddiness  Other abnormalities of gait and  mobility  Other disturbances of skin sensation     Problem List Patient Active Problem List   Diagnosis Date Noted  . Cognitive deficits 05/16/2020  . Visual disturbance 05/16/2020  . Sleep disturbance 04/04/2020  . Post concussive syndrome 03/02/2020  . Dizziness  and giddiness 03/02/2020  . Follicular lymphoma ([place]) 11/16/2018  . Duodenum disorder   . Upper gastrointestinal bleed 07/02/2018  . Abdominal pain, epigastric 07/01/2018  . Elevated lipase 07/01/2018  . Elevated LFTs 07/01/2018  . Iron deficiency anemia 08/17/2017  . C. difficile colitis 08/17/2017  . Dehydration 08/17/2017  . Hypokalemia 08/17/2017  . Symptomatic anemia 07/16/2017  . Bipolar 1 disorder ([place]) 07/16/2017  . Hypotension due to hypovolemia 07/16/2017  . Melena   . Heme positive stool   . Chronic duodenal ulcer with hemorrhage   . Blood transfusion without reported diagnosis 07/14/2017  . Osteoporosis 07/29/2016  . Genetic counseling and testing 01/29/2016  . Pain in joint, pelvic region and thigh 06/12/2015  . 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 Allergy to adhesive tape 01/25/2015  . Malignant neoplasm of upper-outer quadrant of left breast in female, estrogen receptor negative ([place]) 12/22/2014    Rico Junker, PT, DPT 06/14/20    11:19 AM    [facility] [address] [place], [place], [address] Phone: [phone]   Fax:  [phone]  Name: Amisadai Woodford Gardner MRN: [id number] Date of Birth: 1956-02-15

## 2020-06-20 ENCOUNTER — Other Ambulatory Visit: Payer: Self-pay

## 2020-06-20 ENCOUNTER — Encounter: Payer: Self-pay | Admitting: Physical Therapy

## 2020-06-20 ENCOUNTER — Ambulatory Visit: Payer: Medicare Other | Admitting: Physical Therapy

## 2020-06-20 DIAGNOSIS — R2681 Unsteadiness on feet: Secondary | ICD-10-CM

## 2020-06-20 DIAGNOSIS — R42 Dizziness and giddiness: Secondary | ICD-10-CM

## 2020-06-20 DIAGNOSIS — R2689 Other abnormalities of gait and mobility: Secondary | ICD-10-CM

## 2020-06-20 NOTE — Patient Instructions (Signed)
Gaze Stabilization: Standing Feet Apart (Compliant Surface)    Feet apart on pillow, keeping eyes on target on wall _6___ feet away, tilt head down 15-30 and move head side to side for _60___ seconds. Repeat while moving head up and down for _60___ seconds. Do _2___ sessions per day.  USE PATTERNED BACKGROUND - WRAPPING PAPER  DO EXERCISE STANDING ON FLOOR FIRST (60 SECS) - THEN PROGRESS TO PILLOW     WALK BACKWARDS EYES CLOSED - HEAD TURNS SIDE TO SIDE AND UP/DOWN (CAREFULLY)    MARCHING ON PILLOWS - HEAD TURNS SIDE TO SIDE AND UP/DOWN WITH EYES OPEN, THEN PROGRESS TO EYES CLOSED (SLOW MARCHING) FOR INCREASED DIFFICULTY

## 2020-06-20 NOTE — Therapy (Signed)
[facility] [address] [place], [place], [address] Phone: [phone]   Fax:  [phone]  Physical Therapy Treatment  Patient Details  Name: Rhonda Gardner MRN: [id number] Date of Birth: [birth year] Referring Provider (PT): Delice Lesch, MD   Encounter Date: 06/20/2020   PT End of Session - 06/20/20 2225    Visit Number 17    Number of Visits 18    Date for PT Re-Evaluation 06/23/20    Authorization Type Medicare: PN every 10th visit.    Progress Note Due on Visit 20    PT Start Time 0805    PT Stop Time 0850    PT Time Calculation (min) 45 min    Activity Tolerance Patient tolerated treatment well    Behavior During Therapy [facility] for tasks assessed/performed           Past Medical History:  Diagnosis Date  . Allergy 2006   chemical cauterization in spring 2006, and 2nd treatment with good results.(Dr.Shoemaker)  . Bipolar disorder ([place])   . Bleeding ulcer   . Blood transfusion without reported diagnosis 07/2017   Bleeding ulcer  . Breast cancer ([place]) 12/2014   invasive ductal carcinoma (LEFT)  . Breast cancer of upper-outer quadrant of left female breast ([place]) 12/22/2014  . Breast hematoma 12/19/2015   had left breast aspiration of 4cm hematoma-path indicates benign hematoma  . C. difficile colitis   . C. difficile diarrhea   . Depression   . 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 Osteoporosis 06/20/2016   T-2.5@spine  on Dexa ([facility])  . Plantar fasciitis   . PONV (postoperative nausea and vomiting)   . Symptomatic anemia 07/16/2017    Past Surgical History:  Procedure Laterality Date  . BIOPSY  07/03/2018   Procedure: BIOPSY;  Surgeon: Gatha Mayer, MD;  Location: [facility];  Service: Endoscopy;;  . BIOPSY  09/02/2018   Procedure: BIOPSY;  Surgeon: Gatha Mayer, MD;  Location: [facility];  Service: Endoscopy;;  . BIOPSY  10/22/2018    Procedure: BIOPSY;  Surgeon: Milus Banister, MD;  Location: [facility]OS[facility];  Service: Endoscopy;;  . BREAST BIOPSY Bilateral    benign and a right stereotatic breast biopsy.  . COLONOSCOPY    . ESOPHAGOGASTRODUODENOSCOPY N/A 07/16/2017   Procedure: ESOPHAGOGASTRODUODENOSCOPY (EGD);  Surgeon: Ladene Artist, MD;  Location: [facility] ENDOSCOPY;  Service: Endoscopy;  Laterality: N/A;  . ESOPHAGOGASTRODUODENOSCOPY N/A 10/22/2018   Procedure: ESOPHAGOGASTRODUODENOSCOPY (EGD);  Surgeon: Milus Banister, MD;  Location: [facility];  Service: Endoscopy;  Laterality: N/A;  . ESOPHAGOGASTRODUODENOSCOPY (EGD) WITH PROPOFOL N/A 07/03/2018   Procedure: ESOPHAGOGASTRODUODENOSCOPY (EGD) WITH PROPOFOL;  Surgeon: Gatha Mayer, MD;  Location: [facility];  Service: Endoscopy;  Laterality: N/A;  . ESOPHAGOGASTRODUODENOSCOPY (EGD) WITH PROPOFOL N/A 09/02/2018   Procedure: ESOPHAGOGASTRODUODENOSCOPY (EGD) WITH PROPOFOL;  Surgeon: Gatha Mayer, MD;  Location: [facility];  Service: Endoscopy;  Laterality: N/A;  . EUS N/A 10/22/2018   Procedure: UPPER ENDOSCOPIC ULTRASOUND (EUS) RADIAL;  Surgeon: Milus Banister, MD;  Location: [facility]OS[facility];  Service: Endoscopy;  Laterality: N/A;  . fibroid excision  age 43   prolapsed fibroid removed  . FINE NEEDLE ASPIRATION  10/22/2018   Procedure: FINE NEEDLE ASPIRATION;  Surgeon: Milus Banister, MD;  Location: [facility]OSCOPY;  Service: Endoscopy;;  . FOOT SURGERY Left 2014   Dr. Paulla Dolly  . FOOT SURGERY Right 08/2016   Dr. Paulla Dolly --Shortening Osteotomy with Fixation 1st  Metatarsal   . MOUTH SURGERY  06/2017   [facility]   . PORT-A-CATH REMOVAL N/A 02/15/2016   Procedure: REMOVAL PORT-A-CATH;  Surgeon: Autumn Messing III, MD;  Location: [facility];  Service: General;  Laterality: N/A;  . RADIOACTIVE SEED GUIDED PARTIAL MASTECTOMY WITH AXILLARY SENTINEL LYMPH NODE BIOPSY Left 05/25/2015   Procedure: LEFT BREAST LUMPECTOMY WITH RADIOACTIVE SEED AND SENTINEL LYMPH NODE BIOPSY;   Surgeon: Autumn Messing III, MD;  Location: [facility];  Service: General;  Laterality: Left;  . UPPER GASTROINTESTINAL ENDOSCOPY      There were no vitals filed for this visit.   Subjective Assessment - 06/20/20 2206    Subjective Pt states she is walking up/down driveway at home but is unable to keep up the fast pace, however, part of driveway is a little hilly which makes it more difficult; Cont to work on walking backwards with head turns and with eyes closed.  Feels she is continuing to get better    Pertinent History Hx of L breast CA (**no BP in LUE) s/p chemo/radiation, hx of lymphoma, foot surgery (bunions), depression, bipolar disorder, osteoporosis, plantar fasciitis, anemia    Patient Stated Goals For my head to clear up and for my balance to get where it was before.    Currently in Pain? No/denies              [facility] PT Assessment - 06/20/20 0826      Functional Gait  Assessment   Gait assessed  Yes    Gait Level Surface Walks 20 ft in less than 5.5 sec, no assistive devices, good speed, no evidence for imbalance, normal gait pattern, deviates no more than 6 in outside of the 12 in walkway width.    Change in Gait Speed Able to smoothly change walking speed without loss of balance or gait deviation. Deviate no more than 6 in outside of the 12 in walkway width.    Gait with Horizontal Head Turns Performs head turns smoothly with slight change in gait velocity (eg, minor disruption to smooth gait path), deviates 6-10 in outside 12 in walkway width, or uses an assistive device.    Gait with Vertical Head Turns Performs head turns with no change in gait. Deviates no more than 6 in outside 12 in walkway width.    Gait and Pivot Turn Pivot turns safely within 3 sec and stops quickly with no loss of balance.    Step Over Obstacle Is able to step over 2 stacked shoe boxes taped together (9 in total height) without changing gait speed. No evidence of imbalance.    Gait with  Narrow Base of Support Is able to ambulate for 10 steps heel to toe with no staggering.    Gait with Eyes Closed Walks 20 ft, uses assistive device, slower speed, mild gait deviations, deviates 6-10 in outside 12 in walkway width. Ambulates 20 ft in less than 9 sec but greater than 7 sec.    Ambulating Backwards Walks 20 ft, no assistive devices, good speed, no evidence for imbalance, normal gait    Steps Alternating feet, no rail.    Total Score 28    FGA comment: 28/30: low fall risk                         [facility] Adult PT Treatment/Exercise - 06/20/20 1610      Ambulation/Gait   Ambulation/Gait Yes    Ambulation Distance (Feet)  230 Feet    Assistive device None    Gait Pattern Within Functional Limits    Ambulation Surface Level;Indoor      Self-Care   Self-Care Other Self-Care Comments    Other Self-Care Comments  DHI completed - score 30%               Balance Exercises - 06/20/20 0001      Balance Exercises: Standing   Standing Eyes Closed Foam/compliant surface;5 reps;Narrow base of support (BOS)    Rockerboard Anterior/posterior;Head turns;EO;EC;5 reps   5 reps horizontal and vertical EO and EC   Retro Gait Head turns;2 reps   30'   Marching Foam/compliant surface;Head turns;Static;5 reps   5 reps horizontal & vertical with EO and EC   Other Standing Exercises Pt performed marching on incline - EO - with head turns horizontal and vertical; EC with no head turns, then adding horizontal and vertical head turns 5 reps each; alternate stepping forward/back EO, adding horizontal head turns 5 reps ;     Other Standing Exercises Comments Pt performed amb. making circles clockwise and counterclockwise with ball 35' x 2 reps each; pt reported feeling woozy with this activity              PT Education - 06/20/20 2222    Education Details updated HEP - see pt instructions; [place] completed - score 30%    Person(s) Educated Patient    Methods  Explanation;Demonstration;Handout    Comprehension Verbalized understanding;Returned demonstration            PT Short Term Goals - 06/20/20 2227      PT SHORT TERM GOAL #1   Title = LTG for 4 week POC             PT Long Term Goals - 06/20/20 2227      PT LONG TERM GOAL #1   Title Pt will progress with walking program to address symptoms with exertion by reporting ability to ambulate x 30 minutes outside on uneven pavement at moderate pace (20-23 minute mile)    Baseline walking 30"-45" - unsure if 20-23' mile    Time 4    Period Weeks    Status On-going    Target Date 07/21/20      PT LONG TERM GOAL #2   Title Pt will demonstrate independence with progressed HEP    Time 4    Period Weeks    Status Achieved      PT LONG TERM GOAL #3   Title Pt will report 1/5 dizziness overall with all movements on MSQ to indicate decreased motion sensitivity    Time 4    Period Weeks    Status On-going    Target Date 07/21/20      PT LONG TERM GOAL #4   Title Pt will improve FGA score to 29/30 to decr. falls risk.    Baseline 27/30  >> 28/30    Time 4    Period Weeks    Status On-going    Target Date 07/21/20      PT LONG TERM GOAL #5   Title Pt will demonstrate improved use of somatosensory information for standing balance as indicated on SOT by increase in SOM to >95% on sensory analysis (will maintain composite score of >77)    Baseline Sensory analysis for SOM: 90% - no change in somatosensory    Time 4    Period Weeks    Status On-going  Target Date 07/21/20      Additional Long Term Goals   Additional Long Term Goals Yes      PT LONG TERM GOAL #6   Title Improve DHI score to </= 20% to demo improvement in dizziness.    Baseline 30% on 06-20-20    Time 4    Period Weeks    Status New    Target Date 07/21/20                 Plan - 06/20/20 0807    Personal Factors and Comorbidities Age;Comorbidity 3+;Past/Current Experience;Time since onset of  injury/illness/exacerbation    Comorbidities Hx of L breast CA (**no BP in LUE) s/p chemo/radiation, hx of lymphoma, foot surgery (bunions), depression, bipolar disorder, osteoporosis, plantar fasciitis, anemia    Examination-Activity Limitations Bathing;Locomotion Level;Stairs;Transfers;Caring for Others   pt's sister will visit pt soon and she has to assist her sister to the bathroom and she's fearful of the steps   Examination-Participation Restrictions Cleaning;Yard Work;Interpersonal Relationship    Stability/Clinical Decision Making Evolving/Moderate complexity    Rehab Potential Good    PT Frequency 2x / week    PT Duration 4 weeks    PT Treatment/Interventions ADLs/Self Care Home Management;Aquatic Therapy;Biofeedback;Canalith Repostioning;DME Instruction;Balance training;Neuromuscular re-education;Manual techniques;Therapeutic exercise;Therapeutic activities;Functional mobility training;Patient/family education;Stair training;Gait training;Cognitive remediation;Vestibular;Cryotherapy;Moist Heat;Passive range of motion    PT Next Visit Plan **NO BP IN LUE**  Check LTG: recert or ready for D/C??   Balance on compliant surfaces, visual/vestibular exercises, faster walking speeds - on inclines, grass; retro gait    PT Home Exercise Plan x1 viewing and corner/counter balance [email or web address] in [employer]    Consulted and Agree with Plan of Care Patient           Patient will benefit from skilled therapeutic intervention in order to improve the following deficits and impairments:  Abnormal gait, Dizziness, Decreased activity tolerance, Decreased knowledge of use of DME, Decreased balance, Decreased cognition, Decreased mobility, Decreased strength, Impaired sensation, Impaired flexibility  Visit Diagnosis: Unsteadiness on feet - Plan: PT plan of care cert/re-cert  Dizziness and giddiness - Plan: PT plan of care cert/re-cert  Other abnormalities of gait and mobility - Plan: PT plan of care  cert/re-cert     Problem List Patient Active Problem List   Diagnosis Date Noted  . Cognitive deficits 05/16/2020  . Visual disturbance 05/16/2020  . Sleep disturbance 04/04/2020  . Post concussive syndrome 03/02/2020  . Dizziness and giddiness 03/02/2020  . Follicular lymphoma ([place]) 11/16/2018  . Duodenum disorder   . Upper gastrointestinal bleed 07/02/2018  . Abdominal pain, epigastric 07/01/2018  . Elevated lipase 07/01/2018  . Elevated LFTs 07/01/2018  . Iron deficiency anemia 08/17/2017  . C. difficile colitis 08/17/2017  . Dehydration 08/17/2017  . Hypokalemia 08/17/2017  . Symptomatic anemia 07/16/2017  . Bipolar 1 disorder ([place]) 07/16/2017  . Hypotension due to hypovolemia 07/16/2017  . Melena   . Heme positive stool   . Chronic duodenal ulcer with hemorrhage   . Blood transfusion without reported diagnosis 07/14/2017  . Osteoporosis 07/29/2016  . Genetic counseling and testing 01/29/2016  . Pain in joint, pelvic region and thigh 06/12/2015  . 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 Allergy to adhesive tape 01/25/2015  . Malignant neoplasm of upper-outer quadrant of left breast in female, estrogen receptor negative ([place]) 12/22/2014    Garan Frappier, [place], PT 06/20/2020, 10:38 PM  [facility] [address] [place], [place], [address] Phone: [phone]  Fax:  [phone]  Name: Rhonda Gardner MRN: [id number] Date of Birth: Feb 27, 1956

## 2020-06-26 ENCOUNTER — Encounter: Payer: BLUE CROSS/BLUE SHIELD | Admitting: Physical Therapy

## 2020-06-29 ENCOUNTER — Ambulatory Visit: Payer: BLUE CROSS/BLUE SHIELD | Admitting: Physical Medicine & Rehabilitation

## 2020-06-30 ENCOUNTER — Ambulatory Visit: Payer: Medicare Other | Admitting: Physical Therapy

## 2020-06-30 ENCOUNTER — Encounter: Payer: Self-pay | Admitting: Physical Therapy

## 2020-06-30 ENCOUNTER — Other Ambulatory Visit: Payer: Self-pay

## 2020-06-30 DIAGNOSIS — R2681 Unsteadiness on feet: Secondary | ICD-10-CM | POA: Diagnosis not present

## 2020-06-30 DIAGNOSIS — R208 Other disturbances of skin sensation: Secondary | ICD-10-CM

## 2020-06-30 DIAGNOSIS — R42 Dizziness and giddiness: Secondary | ICD-10-CM

## 2020-06-30 DIAGNOSIS — R2689 Other abnormalities of gait and mobility: Secondary | ICD-10-CM

## 2020-06-30 NOTE — Patient Instructions (Signed)
Walking Program: - Slower pace - 20-30 minutes is your current baseline - 2x/week  - 2 times a week try to walk at a slightly faster pace for 15 minutes  (this is a challenge but I can keep going; mild dizziness)  Gaze Stabilization: Standing Feet Together (Compliant Surface)    Letter on busy background.  Feet together on pillow, keeping eyes on target on wall __3__ feet away, tilt head down 15-30 and move head side to side for __30__ seconds. Repeat while moving head up and down for __60__ seconds. Do __2__ sessions per day.   Access Code: [email or web address] URL: [email or web address] Date: 06/30/2020 Prepared by: Misty Stanley  Exercises Walking with Eyes Closed and Counter Support - 1 x daily - 5 x weekly - 1 sets - 4 reps Romberg Stance Eyes Closed on Foam Pad - 1 x daily - 5 x weekly - 2 sets - 10 reps Backward Walking, EYES OPEN, Head turns/nods - 1 x daily - 7 x weekly - 3 sets Walking Tandem Stance - 1 x daily - 7 x weekly - 2 sets

## 2020-06-30 NOTE — Therapy (Signed)
[facility] [address] [place], [place], [address] Phone: [phone]   Fax:  [phone]  Physical Therapy Treatment and 10th visit Progress Note  Patient Details  Name: Rhonda Gardner MRN: [id number] Date of Birth: 09-30-1956 Referring Provider (PT): Delice Lesch, MD   Encounter Date: 06/30/2020   Progress Note Reporting Period 05/24/2020 to 06/30/2020  See note below for Objective Data and Assessment of Progress/Goals.     PT End of Session - 06/30/20 1149    Visit Number 18   10th visit note performed today on visit 18   Number of Visits 22    Date for PT Re-Evaluation 07/30/20    Authorization Type Medicare: PN every 10th visit.    Progress Note Due on Visit 28   performed on visit 18   PT Start Time 1105    PT Stop Time 1149    PT Time Calculation (min) 44 min    Activity Tolerance Patient tolerated treatment well    Behavior During Therapy WFL for tasks assessed/performed           Past Medical History:  Diagnosis Date  . Allergy 2006   chemical cauterization in spring 2006, and 2nd treatment with good results.(Dr.Shoemaker)  . Bipolar disorder ([place])   . Bleeding ulcer   . Blood transfusion without reported diagnosis 07/2017   Bleeding ulcer  . Breast cancer ([place]) 12/2014   invasive ductal carcinoma (LEFT)  . Breast cancer of upper-outer quadrant of left female breast ([place]) 12/22/2014  . Breast hematoma 12/19/2015   had left breast aspiration of 4cm hematoma-path indicates benign hematoma  . C. difficile colitis   . C. difficile diarrhea   . Depression   . 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 Osteoporosis 06/20/2016   T-2.5'@spine'  on Dexa ([facility])  . Plantar fasciitis   . PONV (postoperative nausea and vomiting)   . Symptomatic anemia 07/16/2017    Past Surgical History:  Procedure Laterality Date  . BIOPSY  07/03/2018   Procedure: BIOPSY;   Surgeon: Gatha Mayer, MD;  Location: [facility];  Service: Endoscopy;;  . BIOPSY  09/02/2018   Procedure: BIOPSY;  Surgeon: Gatha Mayer, MD;  Location: [facility];  Service: Endoscopy;;  . BIOPSY  10/22/2018   Procedure: BIOPSY;  Surgeon: Milus Banister, MD;  Location: [facility];  Service: Endoscopy;;  . BREAST BIOPSY Bilateral    benign and a right stereotatic breast biopsy.  . COLONOSCOPY    . ESOPHAGOGASTRODUODENOSCOPY N/A 07/16/2017   Procedure: ESOPHAGOGASTRODUODENOSCOPY (EGD);  Surgeon: Ladene Artist, MD;  Location: [facility] ENDOSCOPY;  Service: Endoscopy;  Laterality: N/A;  . ESOPHAGOGASTRODUODENOSCOPY N/A 10/22/2018   Procedure: ESOPHAGOGASTRODUODENOSCOPY (EGD);  Surgeon: Milus Banister, MD;  Location: [facility];  Service: Endoscopy;  Laterality: N/A;  . ESOPHAGOGASTRODUODENOSCOPY (EGD) WITH PROPOFOL N/A 07/03/2018   Procedure: ESOPHAGOGASTRODUODENOSCOPY (EGD) WITH PROPOFOL;  Surgeon: Gatha Mayer, MD;  Location: [facility];  Service: Endoscopy;  Laterality: N/A;  . ESOPHAGOGASTRODUODENOSCOPY (EGD) WITH PROPOFOL N/A 09/02/2018   Procedure: ESOPHAGOGASTRODUODENOSCOPY (EGD) WITH PROPOFOL;  Surgeon: Gatha Mayer, MD;  Location: [facility];  Service: Endoscopy;  Laterality: N/A;  . EUS N/A 10/22/2018   Procedure: UPPER ENDOSCOPIC ULTRASOUND (EUS) RADIAL;  Surgeon: Milus Banister, MD;  Location: [facility];  Service: Endoscopy;  Laterality: N/A;  . fibroid excision  age 65   prolapsed fibroid removed  . FINE NEEDLE ASPIRATION  10/22/2018   Procedure:  FINE NEEDLE ASPIRATION;  Surgeon: Milus Banister, MD;  Location: [facility];  Service: Endoscopy;;  . FOOT SURGERY Left 2014   Dr. Paulla Dolly  . FOOT SURGERY Right 08/2016   Dr. Paulla Dolly --Shortening Osteotomy with Fixation 1st Metatarsal   . MOUTH SURGERY  06/2017   [facility]   . PORT-A-CATH REMOVAL N/A 02/15/2016   Procedure: REMOVAL PORT-A-CATH;  Surgeon: Autumn Messing III, MD;  Location: [facility];  Service:  General;  Laterality: N/A;  . RADIOACTIVE SEED GUIDED PARTIAL MASTECTOMY WITH AXILLARY SENTINEL LYMPH NODE BIOPSY Left 05/25/2015   Procedure: LEFT BREAST LUMPECTOMY WITH RADIOACTIVE SEED AND SENTINEL LYMPH NODE BIOPSY;  Surgeon: Autumn Messing III, MD;  Location: [facility];  Service: General;  Laterality: Left;  . UPPER GASTROINTESTINAL ENDOSCOPY      There were no vitals filed for this visit.   Subjective Assessment - 06/30/20 1111    Subjective Has started carrying 2lb weights when walking and has worked up to full route with weights.    Pertinent History Hx of L breast CA (**no BP in LUE) s/p chemo/radiation, hx of lymphoma, foot surgery (bunions), depression, bipolar disorder, osteoporosis, plantar fasciitis, anemia    Patient Stated Goals For my head to clear up and for my balance to get where it was before.    Currently in Pain? No/denies              [facility] PT Assessment - 06/30/20 1117      Assessment   Medical Diagnosis Postconcussion syndrome    Referring Provider (PT) Delice Lesch, MD    Onset Date/Surgical Date 03/03/19    Hand Dominance Right    Next MD Visit 04/04/20    Prior Therapy none      Precautions   Precautions Fall    Precaution Comments osteoporosis      Observation/Other Assessments   Focus on Therapeutic Outcomes (FOTO)  Not assessed    Other Surveys  Dizziness Handicap Inventory [facility])    Dizziness Handicap Inventory [facility])  18      Functional Gait  Assessment   Gait assessed  Yes    Gait Level Surface Walks 20 ft in less than 5.5 sec, no assistive devices, good speed, no evidence for imbalance, normal gait pattern, deviates no more than 6 in outside of the 12 in walkway width.    Change in Gait Speed Able to smoothly change walking speed without loss of balance or gait deviation. Deviate no more than 6 in outside of the 12 in walkway width.    Gait with Horizontal Head Turns Performs head turns smoothly with no change in gait. Deviates no  more than 6 in outside 12 in walkway width    Gait with Vertical Head Turns Performs head turns with no change in gait. Deviates no more than 6 in outside 12 in walkway width.    Gait and Pivot Turn Pivot turns safely within 3 sec and stops quickly with no loss of balance.    Step Over Obstacle Is able to step over 2 stacked shoe boxes taped together (9 in total height) without changing gait speed. No evidence of imbalance.    Gait with Narrow Base of Support Is able to ambulate for 10 steps heel to toe with no staggering.    Gait with Eyes Closed Walks 20 ft, uses assistive device, slower speed, mild gait deviations, deviates 6-10 in outside 12 in walkway width. Ambulates 20 ft in less than 9 sec  but greater than 7 sec.    Ambulating Backwards Walks 20 ft, no assistive devices, good speed, no evidence for imbalance, normal gait    Steps Alternating feet, no rail.    Total Score 29    FGA comment: 29/30 low falls risk.  Greatest difficulty with eyes closed               Vestibular Assessment - 06/30/20 1113      Balancemaster   Engineer, materials Comment Not assessed today      Positional Sensitivities   Nose to Right Knee No dizziness    Right Knee to Sitting No dizziness    Nose to Left Knee No dizziness    Left Knee to Sitting No dizziness    Head Turning x 5 Lightheadedness    Head Nodding x 5 No dizziness    Pivot Right in Standing Mild dizziness    Pivot Left in Standing Mild dizziness                    [facility] Adult PT Treatment/Exercise - 06/30/20 1200      Exercises   Exercises Other Exercises    Other Exercises  Reviewed and condensed HEP - removed standing on foam with EO with head turns but added head turns to standing on foam with EC.  Added tandem gait.  Added head turns to walking with eyes closed.  Removed walking forwards with head movements, kept backwards walking with EO and EC with head movements.  Provided pt  with handout of condensed HEP.            Walking Program: - Slower pace - 20-30 minutes is your current baseline - 2x/week  - 2 times a week try to walk at a slightly faster pace for 15 minutes  (this is a challenge but I can keep going; mild dizziness)  Gaze Stabilization: Standing Feet Together (Compliant Surface)    Letter on busy background.  Feet together on pillow, keeping eyes on target on wall __3__ feet away, tilt head down 15-30 and move head side to side for __30__ seconds. Repeat while moving head up and down for __60__ seconds. Do __2__ sessions per day.   Access Code: [email or web address] URL: [email or web address] Date: 06/30/2020 Prepared by: Misty Stanley  Exercises Walking with Eyes Closed and Counter Support - 1 x daily - 5 x weekly - 1 sets - 4 reps Romberg Stance Eyes Closed on Foam Pad - 1 x daily - 5 x weekly - 2 sets - 10 reps Backward Walking, EYES OPEN, Head turns/nods - 1 x daily - 7 x weekly - 3 sets Walking Tandem Stance - 1 x daily - 7 x weekly - 2 sets      PT Education - 06/30/20 1148    Education Details progress towards goals, recert for 3 more visits, condensed and reviewed HEP    Person(s) Educated Patient    Methods Explanation    Comprehension Verbalized understanding            PT Short Term Goals - 06/20/20 2227      PT SHORT TERM GOAL #1   Title = LTG for 4 week POC             PT Long Term Goals - 06/30/20 1110      PT LONG TERM GOAL #1   Title Pt will progress with walking program to address symptoms with exertion by reporting  ability to ambulate x 30 minutes outside on uneven pavement at moderate pace (20-23 minute mile)    Baseline walking 30"-45" - unsure if 20-23' mile, has added 2lb weights, pace is not consistent    Time 4    Period Weeks    Status Partially Met      PT LONG TERM GOAL #2   Title Pt will demonstrate independence with progressed HEP    Time 4    Period Weeks    Status Achieved      PT  LONG TERM GOAL #3   Title Pt will report 1/5 dizziness overall with all movements on MSQ to indicate decreased motion sensitivity    Baseline 2/5 for body turns now; rest of movements 0 or 1/5    Time 4    Period Weeks    Status Partially Met      PT LONG TERM GOAL #4   Title Pt will improve FGA score to 29/30 to decr. falls risk.    Baseline 29/30    Time 4    Period Weeks    Status Achieved      PT LONG TERM GOAL #5   Title Pt will demonstrate improved use of somatosensory information for standing balance as indicated on SOT by increase in SOM to >95% on sensory analysis (will maintain composite score of >77)    Baseline Sensory analysis for SOM: 90% - no change in somatosensory    Time 4    Period Weeks    Status On-going      PT LONG TERM GOAL #6   Title Improve DHI score to </= 20% to demo improvement in dizziness.    Baseline 18    Time 4    Period Weeks    Status Achieved          New LTG for recertification:   PT Long Term Goals - 06/30/20 1211      PT LONG TERM GOAL #1   Title Pt will progress with walking program to address symptoms with exertion by reporting ability to ambulate x 30 minutes outside on uneven pavement maintaining moderate pace (20-23 minute mile)    Baseline walking 30"-45" - unsure if 20-23' mile, has added 2lb weights, pace is not consistent    Time 4    Period Weeks    Status Revised    Target Date 07/30/20      PT LONG TERM GOAL #2   Title Pt will demonstrate independence with progressed HEP    Time 4    Period Weeks    Status Revised    Target Date 07/30/20      PT LONG TERM GOAL #3   Title Pt will report 1/5 dizziness overall with turns to R and turns to L on MSQ to indicate decreased motion sensitivity    Baseline 2/5 for body turns now; rest of movements 0 or 1/5    Time 4    Period Weeks    Status Revised    Target Date 07/30/20      PT LONG TERM GOAL #4   Title Pt will improve FGA score to 30/30 to decr. falls risk.     Baseline 29/30    Time 4    Period Weeks    Status Revised    Target Date 07/30/20      PT LONG TERM GOAL #5   Title Pt will demonstrate improved use of somatosensory information for standing balance as indicated on  SOT by increase in [facility] to >95% on sensory analysis (will maintain composite score of >77)    Baseline Sensory analysis for SOM: 90% - no change in somatosensory    Time 4    Period Weeks    Status Revised    Target Date 07/30/20      PT LONG TERM GOAL #6   Title Improve DHI score to </= 10 to demo improvement in dizziness.    Baseline 18    Time 4    Period Weeks    Status Revised    Target Date 07/30/20                Plan - 06/30/20 1203    Clinical Impression Statement Assessed patient progress towards LTG.  Pt continues to make steady progress and has met 3/6 LTG.  Pt demonstrates improved safety and decreased falls risk as indicated on FGA but continues to demonstrate decreased safety when vision removed (in dark).  Pt also reporting increased function and decreased handicap as indicated by decrease in [place] to 18 (mild).  Pt also demonstrates independence with HEP which was condensed and upgraded today.  Pt demonstrates continued decrease in motion sensitivity and no longer experiences dizziness with bending down to the floor; continues to experience dizziness when on compliant surface and with body turns.  Pt would benefit from continued skilled PT services to continue to address these impairments to maximize functional mobility independence and decrease falls risk.    Personal Factors and Comorbidities Age;Comorbidity 3+;Past/Current Experience;Time since onset of injury/illness/exacerbation    Comorbidities Hx of L breast CA (**no BP in LUE) s/p chemo/radiation, hx of lymphoma, foot surgery (bunions), depression, bipolar disorder, osteoporosis, plantar fasciitis, anemia    Examination-Activity Limitations Bathing;Locomotion Level;Stairs;Transfers;Caring for Others    pt's sister will visit pt soon and she has to assist her sister to the bathroom and she's fearful of the steps   Examination-Participation Restrictions Cleaning;Yard Work;Interpersonal Relationship    Rehab Potential Good    PT Frequency 1x / week    PT Duration 4 weeks    PT Treatment/Interventions ADLs/Self Care Home Management;Aquatic Therapy;Biofeedback;Canalith Repostioning;DME Instruction;Balance training;Neuromuscular re-education;Manual techniques;Therapeutic exercise;Therapeutic activities;Functional mobility training;Patient/family education;Stair training;Gait training;Cognitive remediation;Vestibular;Cryotherapy;Moist Heat;Passive range of motion    PT Next Visit Plan **NO BP IN LUE**   Recerted for final 3 goals to continue to address Balance on compliant surfaces, eyes closed, balance reactions, faster walking speeds - on inclines, grass; retro gait    PT Home Exercise Plan HEP condensed and reviewed on 9/17.  [email or web address] in [place] and Agree with Plan of Care Patient           Patient will benefit from skilled therapeutic intervention in order to improve the following deficits and impairments:  Abnormal gait, Dizziness, Decreased activity tolerance, Decreased knowledge of use of DME, Decreased balance, Decreased cognition, Decreased mobility, Decreased strength, Impaired sensation, Impaired flexibility  Visit Diagnosis: Dizziness and giddiness  Unsteadiness on feet  Other abnormalities of gait and mobility  Other disturbances of skin sensation     Problem List Patient Active Problem List   Diagnosis Date Noted  . Cognitive deficits 05/16/2020  . Visual disturbance 05/16/2020  . Sleep disturbance 04/04/2020  . Post concussive syndrome 03/02/2020  . Dizziness and giddiness 03/02/2020  . Follicular lymphoma ([place]) 11/16/2018  . Duodenum disorder   . Upper gastrointestinal bleed 07/02/2018  . Abdominal pain, epigastric 07/01/2018  . Elevated lipase  07/01/2018  . Elevated LFTs  07/01/2018  . Iron deficiency anemia 08/17/2017  . C. difficile colitis 08/17/2017  . Dehydration 08/17/2017  . Hypokalemia 08/17/2017  . Symptomatic anemia 07/16/2017  . Bipolar 1 disorder ([place]) 07/16/2017  . Hypotension due to hypovolemia 07/16/2017  . Melena   . Heme positive stool   . Chronic duodenal ulcer with hemorrhage   . Blood transfusion without reported diagnosis 07/14/2017  . Osteoporosis 07/29/2016  . Genetic counseling and testing 01/29/2016  . Pain in joint, pelvic region and thigh 06/12/2015  . 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 Allergy to adhesive tape 01/25/2015  . Malignant neoplasm of upper-outer quadrant of left breast in female, estrogen receptor negative ([place]) 12/22/2014    Rico Junker, PT, DPT 06/30/20    12:11 PM    [facility] [address] [place], [place], [address] Phone: [phone]   Fax:  [phone]  Name: Rhonda Gardner MRN: [id number] Date of Birth: 1956/07/01

## 2020-07-03 ENCOUNTER — Encounter: Payer: BLUE CROSS/BLUE SHIELD | Admitting: Physical Therapy

## 2020-07-06 ENCOUNTER — Ambulatory Visit: Payer: Medicare Other | Admitting: Physical Therapy

## 2020-07-06 ENCOUNTER — Other Ambulatory Visit: Payer: Self-pay

## 2020-07-06 DIAGNOSIS — R2681 Unsteadiness on feet: Secondary | ICD-10-CM | POA: Diagnosis not present

## 2020-07-06 DIAGNOSIS — R42 Dizziness and giddiness: Secondary | ICD-10-CM

## 2020-07-06 DIAGNOSIS — R2689 Other abnormalities of gait and mobility: Secondary | ICD-10-CM

## 2020-07-07 ENCOUNTER — Telehealth: Payer: Self-pay | Admitting: Family Medicine

## 2020-07-07 ENCOUNTER — Encounter: Payer: Self-pay | Admitting: Physical Therapy

## 2020-07-07 NOTE — Telephone Encounter (Signed)
Pt called & request Booster Pfizer, had 2nd shot 11/27/19.  Is this ok?

## 2020-07-07 NOTE — Telephone Encounter (Signed)
Called & scheduled flu and Covid booster

## 2020-07-07 NOTE — Therapy (Signed)
[facility] [address] [place], [place], [address] Phone: [phone]   Fax:  [phone]  Physical Therapy Treatment  Patient Details  Name: Rhonda Gardner MRN: [id number] Date of Birth: 04/25/1956 Referring Provider (PT): Delice Lesch, MD   Encounter Date: 07/06/2020   PT End of Session - 07/07/20 1558    Visit Number 19    Number of Visits 22    Date for PT Re-Evaluation 07/30/20    Authorization Type Medicare: PN every 10th visit.    Progress Note Due on Visit 28   performed on visit 18   PT Start Time 1620    PT Stop Time 1700    PT Time Calculation (min) 40 min    Activity Tolerance Patient tolerated treatment well    Behavior During Therapy WFL for tasks assessed/performed           Past Medical History:  Diagnosis Date  . Allergy 2006   chemical cauterization in spring 2006, and 2nd treatment with good results.(Dr.Shoemaker)  . Bipolar disorder ([place])   . Bleeding ulcer   . Blood transfusion without reported diagnosis 07/2017   Bleeding ulcer  . Breast cancer ([place]) 12/2014   invasive ductal carcinoma (LEFT)  . Breast cancer of upper-outer quadrant of left female breast ([place]) 12/22/2014  . Breast hematoma 12/19/2015   had left breast aspiration of 4cm hematoma-path indicates benign hematoma  . C. difficile colitis   . C. difficile diarrhea   . Depression   . 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 Osteoporosis 06/20/2016   T-2.5@spine  on Dexa ([facility])  . Plantar fasciitis   . PONV (postoperative nausea and vomiting)   . Symptomatic anemia 07/16/2017    Past Surgical History:  Procedure Laterality Date  . BIOPSY  07/03/2018   Procedure: BIOPSY;  Surgeon: Gatha Mayer, MD;  Location: [facility];  Service: Endoscopy;;  . BIOPSY  09/02/2018   Procedure: BIOPSY;  Surgeon: Gatha Mayer, MD;  Location: [facility];  Service: Endoscopy;;  .  BIOPSY  10/22/2018   Procedure: BIOPSY;  Surgeon: Milus Banister, MD;  Location: [facility];  Service: Endoscopy;;  . BREAST BIOPSY Bilateral    benign and a right stereotatic breast biopsy.  . COLONOSCOPY    . ESOPHAGOGASTRODUODENOSCOPY N/A 07/16/2017   Procedure: ESOPHAGOGASTRODUODENOSCOPY (EGD);  Surgeon: Ladene Artist, MD;  Location[facility] END[facility]COPY;  Service: Endoscopy;  Laterality: N/A;  . ESOPHAGOGASTRODUODENOSCOPY N/A 10/22/2018   Procedure: ESOPHAGOGASTRODUODENOSCOPY (EGD);  Surgeon: Milus Banister, MD;  Location: [facility];  Service: Endoscopy;  Laterality: N/A;  . ESOPHAGOGASTRODUODENOSCOPY (EGD) WITH PROPOFOL N/A 07/03/2018   Procedure: ESOPHAGOGASTRODUODENOSCOPY (EGD) WITH PROPOFOL;  Surgeon: Gatha Mayer, MD;  [facility];  Service: Endoscopy;  Laterality: N/A;  . ESOPHAGOGASTRODUODENOSCOPY (EGD) WITH PROPOFOL N/A 09/02/2018   Procedure: ESOPHAGOGASTRODUODENOSCOPY (EGD) WITH PROPOFOL;  Surgeon: Gatha Mayer, MD;  [facility];  Service: Endoscopy;  Laterality: N/A;  . EUS N/A 10/22/2018   Procedure: UPPER ENDOSCOPIC ULTRASOUND (EUS) RADIAL;  Surgeon: Milus Banister, MD;  [facility];  Service: Endoscopy;  Laterality: N/A;  . fibroid excision  age 70   prolapsed fibroid removed  . FINE NEEDLE ASPIRATION  10/22/2018   Procedure: FINE NEEDLE ASPIRATION;  Surgeon: Milus Banister, MD;  Location: [facility];  Service: Endoscopy;;  . FOOT SURGERY Left 2014   Dr. Paulla Dolly  . FOOT SURGERY Right 08/2016   Dr. Paulla Dolly --  Shortening Osteotomy with Fixation 1st Metatarsal   . MOUTH SURGERY  06/2017   [facility]   . PORT-A-CATH REMOVAL N/A 02/15/2016   Procedure: REMOVAL PORT-A-CATH;  Surgeon: Autumn Messing III, MD;  Location: [facility];  Service: General;  Laterality: N/A;  . RADIOACTIVE SEED GUIDED PARTIAL MASTECTOMY WITH AXILLARY SENTINEL LYMPH NODE BIOPSY Left 05/25/2015   Procedure: LEFT BREAST LUMPECTOMY WITH RADIOACTIVE SEED AND SENTINEL  LYMPH NODE BIOPSY;  Surgeon: Autumn Messing III, MD;  Location: [facility];  Service: General;  Laterality: Left;  . UPPER GASTROINTESTINAL ENDOSCOPY      There were no vitals filed for this visit.   Subjective Assessment - 07/06/20 1622    Subjective Exercises going well - she says the exercise of eyes closed walking backwards is still hardest; pt states she is doing better    Pertinent History Hx of L breast CA (**no BP in LUE) s/p chemo/radiation, hx of lymphoma, foot surgery (bunions), depression, bipolar disorder, osteoporosis, plantar fasciitis, anemia    Patient Stated Goals For my head to clear up and for my balance to get where it was before.    Currently in Pain? No/denies                             [facility] Adult PT Treatment/Exercise - 07/07/20 0001      Ambulation/Gait   Ambulation/Gait Yes    Ambulation Distance (Feet) 115 Feet    Assistive device None    Gait Pattern Within Functional Limits    Ambulation Surface Level;Indoor   at end of session for resolution of symptoms              Balance Exercises - 07/07/20 0001      Balance Exercises: Standing   Standing Eyes Closed Foam/compliant surface;5 reps;Narrow base of support (BOS)    Rockerboard Anterior/posterior;Head turns;EO;EC;5 reps   5 reps horizontal and vertical EO and EC   Tandem Gait Foam/compliant surface;2 reps   15 secs with head turns side to side   Retro Gait Head turns;2 reps   30'   Marching Foam/compliant surface;Head turns;Static;5 reps   5 reps horizontal & vertical with EO and EC   Other Standing Exercises Pt performed marching on incline - EO - with head turns horizontal and vertical; EC with no head turns, then adding horizontal and vertical head turns 5 reps each; alternate stepping forward/back EO, adding horizontal head turns 5 reps ;     Other Standing Exercises Comments Pt performed amb. making circles clockwise and counterclockwise with ball 35' x 2 reps  each; pt reported feeling woozy with this activity              PT Education - 07/07/20 1556    Education Details pt instructed to prioritize balance on foam and reduce x1 viewing exercise as she states she is performing this ex. on patterned background and standing on foam for 1" without dfficulty    Person(s) Educated Patient    Methods Explanation    Comprehension Verbalized understanding            PT Short Term Goals - 06/20/20 2227      PT SHORT TERM GOAL #1   Title = LTG for 4 week POC             PT Long Term Goals - 07/07/20 1601      PT LONG TERM GOAL #1  Title Pt will progress with walking program to address symptoms with exertion by reporting ability to ambulate x 30 minutes outside on uneven pavement maintaining moderate pace (20-23 minute mile)    Baseline walking 30"-45" - unsure if 20-23' mile, has added 2lb weights, pace is not consistent    Time 4    Period Weeks    Status Revised      PT LONG TERM GOAL #2   Title Pt will demonstrate independence with progressed HEP    Time 4    Period Weeks    Status Revised      PT LONG TERM GOAL #3   Title Pt will report 1/5 dizziness overall with turns to R and turns to L on MSQ to indicate decreased motion sensitivity    Baseline 2/5 for body turns now; rest of movements 0 or 1/5    Time 4    Period Weeks    Status Revised      PT LONG TERM GOAL #4   Title Pt will improve FGA score to 30/30 to decr. falls risk.    Baseline 29/30    Time 4    Period Weeks    Status Revised      PT LONG TERM GOAL #5   Title Pt will demonstrate improved use of somatosensory information for standing balance as indicated on SOT by increase in SOM to >95% on sensory analysis (will maintain composite score of >77)    Baseline Sensory analysis for SOM: 90% - no change in somatosensory    Time 4    Period Weeks    Status Revised      PT LONG TERM GOAL #6   Title Improve DHI score to </= 10 to demo improvement in  dizziness.    Baseline 18    Time 4    Period Weeks    Status Revised                 Plan - 07/07/20 1559    Clinical Impression Statement Pt is progressing well towards goals.  Pt cont to report feelings of "wooziness" with vestibular activities but pt did not need to take a break and "walk it off" today as she has in previous sessions; increased tolerance noted.  Cont. with POC.    Personal Factors and Comorbidities Age;Comorbidity 3+;Past/Current Experience;Time since onset of injury/illness/exacerbation    Comorbidities Hx of L breast CA (**no BP in LUE) s/p chemo/radiation, hx of lymphoma, foot surgery (bunions), depression, bipolar disorder, osteoporosis, plantar fasciitis, anemia    Examination-Activity Limitations Bathing;Locomotion Level;Stairs;Transfers;Caring for Others   pt's sister will visit pt soon and she has to assist her sister to the bathroom and she's fearful of the steps   Examination-Participation Restrictions Cleaning;Yard Work;Interpersonal Relationship    Rehab Potential Good    PT Frequency 1x / week    PT Duration 4 weeks    PT Treatment/Interventions ADLs/Self Care Home Management;Aquatic Therapy;Biofeedback;Canalith Repostioning;DME Instruction;Balance training;Neuromuscular re-education;Manual techniques;Therapeutic exercise;Therapeutic activities;Functional mobility training;Patient/family education;Stair training;Gait training;Cognitive remediation;Vestibular;Cryotherapy;Moist Heat;Passive range of motion    PT Next Visit Plan **NO BP IN LUE**   Recerted for final 3 goals to continue to address Balance on compliant surfaces, eyes closed, balance reactions, faster walking speeds - on inclines, grass; retro gait    PT Home Exercise Plan HEP condensed and reviewed on 9/17.  [id number] in [place] and Agree with Plan of Care Patient  Patient will benefit from skilled therapeutic intervention in order to improve the following deficits  and impairments:  Abnormal gait, Dizziness, Decreased activity tolerance, Decreased knowledge of use of DME, Decreased balance, Decreased cognition, Decreased mobility, Decreased strength, Impaired sensation, Impaired flexibility  Visit Diagnosis: Dizziness and giddiness  Unsteadiness on feet  Other abnormalities of gait and mobility     Problem List Patient Active Problem List   Diagnosis Date Noted  . Cognitive deficits 05/16/2020  . Visual disturbance 05/16/2020  . Sleep disturbance 04/04/2020  . Post concussive syndrome 03/02/2020  . Dizziness and giddiness 03/02/2020  . Follicular lymphoma ([place]) 11/16/2018  . Duodenum disorder   . Upper gastrointestinal bleed 07/02/2018  . Abdominal pain, epigastric 07/01/2018  . Elevated lipase 07/01/2018  . Elevated LFTs 07/01/2018  . Iron deficiency anemia 08/17/2017  . C. difficile colitis 08/17/2017  . Dehydration 08/17/2017  . Hypokalemia 08/17/2017  . Symptomatic anemia 07/16/2017  . Bipolar 1 disorder ([place]) 07/16/2017  . Hypotension due to hypovolemia 07/16/2017  . Melena   . Heme positive stool   . Chronic duodenal ulcer with hemorrhage   . Blood transfusion without reported diagnosis 07/14/2017  . Osteoporosis 07/29/2016  . Genetic counseling and testing 01/29/2016  . Pain in joint, pelvic region and thigh 06/12/2015  . 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 Allergy to adhesive tape 01/25/2015  . Malignant neoplasm of upper-outer quadrant of left breast in female, estrogen receptor negative ([place]) 12/22/2014    Anyela Napierkowski, [place], PT 07/07/2020, 4:02 PM  [facility] [address] [place], [place], [address] Phone: [phone]   Fax:  [phone]  Name: Rhonda Gardner MRN: [id number] Date of Birth: 1955-11-03

## 2020-07-07 NOTE — Telephone Encounter (Signed)
Yes. Over 65 and >6-8 months from 2nd shot per your message. As long as no other vaccine within 2 weeks. Can give flu shot same day if she hasn't had yet.

## 2020-07-10 ENCOUNTER — Ambulatory Visit (INDEPENDENT_AMBULATORY_CARE_PROVIDER_SITE_OTHER): Payer: Medicare Other

## 2020-07-10 ENCOUNTER — Other Ambulatory Visit: Payer: Medicare Other

## 2020-07-10 ENCOUNTER — Ambulatory Visit: Payer: Medicare Other | Admitting: Physical Therapy

## 2020-07-10 ENCOUNTER — Other Ambulatory Visit: Payer: Self-pay

## 2020-07-10 DIAGNOSIS — Z23 Encounter for immunization: Secondary | ICD-10-CM

## 2020-07-17 ENCOUNTER — Encounter: Payer: BLUE CROSS/BLUE SHIELD | Admitting: Physical Therapy

## 2020-07-20 ENCOUNTER — Ambulatory Visit: Payer: Medicare Other | Attending: Physical Medicine & Rehabilitation | Admitting: Physical Therapy

## 2020-07-20 ENCOUNTER — Other Ambulatory Visit: Payer: Self-pay

## 2020-07-20 DIAGNOSIS — R42 Dizziness and giddiness: Secondary | ICD-10-CM | POA: Diagnosis present

## 2020-07-20 DIAGNOSIS — R2689 Other abnormalities of gait and mobility: Secondary | ICD-10-CM

## 2020-07-20 DIAGNOSIS — R2681 Unsteadiness on feet: Secondary | ICD-10-CM | POA: Diagnosis present

## 2020-07-20 DIAGNOSIS — R208 Other disturbances of skin sensation: Secondary | ICD-10-CM | POA: Insufficient documentation

## 2020-07-21 NOTE — Therapy (Signed)
[facility] [address] [place], [place], [address] Phone: [phone]   Fax:  [phone]  Physical Therapy Treatment  Patient Details  Name: Rhonda Gardner MRN: [id number] Date of Birth: Jan 13, 1956 Referring Provider (PT): Delice Lesch, MD   Encounter Date: 07/20/2020   PT End of Session - 07/21/20 1134    Visit Number 20   10th visit note was done on visit 18   Number of Visits 22    Date for PT Re-Evaluation 07/30/20    Authorization Type Medicare: PN every 10th visit.    Progress Note Due on Visit 28   performed on visit 18   PT Start Time 0803    PT Stop Time 0846    PT Time Calculation (min) 43 min    Activity Tolerance Patient tolerated treatment well    Behavior During Therapy [facility] for tasks assessed/performed           Past Medical History:  Diagnosis Date  . Allergy 2006   chemical cauterization in spring 2006, and 2nd treatment with good results.(Dr.Shoemaker)  . Bipolar disorder ([place])   . Bleeding ulcer   . Blood transfusion without reported diagnosis 07/2017   Bleeding ulcer  . Breast cancer ([place]) 12/2014   invasive ductal carcinoma (LEFT)  . Breast cancer of upper-outer quadrant of left female breast ([place]) 12/22/2014  . Breast hematoma 12/19/2015   had left breast aspiration of 4cm hematoma-path indicates benign hematoma  . C. difficile colitis   . C. difficile diarrhea   . Depression   . 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 Osteoporosis 06/20/2016   T-2.5@spine  on Dexa ([facility])  . Plantar fasciitis   . PONV (postoperative nausea and vomiting)   . Symptomatic anemia 07/16/2017    Past Surgical History:  Procedure Laterality Date  . BIOPSY  07/03/2018   Procedure: BIOPSY;  Surgeon: Gatha Mayer, MD;  Location: [facility];  Service: Endoscopy;;  . BIOPSY  09/02/2018   Procedure: BIOPSY;  Surgeon: Gatha Mayer, MD;  [facility];  Service: Endoscopy;;  . BIOPSY  10/22/2018   Procedure: BIOPSY;  Surgeon: Milus Banister, MD;  [facility];  Service: Endoscopy;;  . BREAST BIOPSY Bilateral    benign and a right stereotatic breast biopsy.  . COLONOSCOPY    . ESOPHAGOGASTRODUODENOSCOPY N/A 07/16/2017   Procedure: ESOPHAGOGASTRODUODENOSCOPY (EGD);  Surgeon: Ladene Artist, MD;  Location: [facility] ENDOSCOPY;  Service: Endoscopy;  Laterality: N/A;  . ESOPHAGOGASTRODUODENOSCOPY N/A 10/22/2018   Procedure: ESOPHAGOGASTRODUODENOSCOPY (EGD);  Surgeon: Milus Banister, MD;  Location: [facility];  Service: Endoscopy;  Laterality: N/A;  . ESOPHAGOGASTRODUODENOSCOPY (EGD) WITH PROPOFOL N/A 07/03/2018   Procedure: ESOPHAGOGASTRODUODENOSCOPY (EGD) WITH PROPOFOL;  Surgeon: Gatha Mayer, MD;  [facility];  Service: Endoscopy;  Laterality: N/A;  . ESOPHAGOGASTRODUODENOSCOPY (EGD) WITH PROPOFOL N/A 09/02/2018   Procedure: ESOPHAGOGASTRODUODENOSCOPY (EGD) WITH PROPOFOL;  Surgeon: Gatha Mayer, MD;  [facility];  Service: Endoscopy;  Laterality: N/A;  . EUS N/A 10/22/2018   Procedure: UPPER ENDOSCOPIC ULTRASOUND (EUS) RADIAL;  Surgeon: Milus Banister, MD;  [facility];  Service: Endoscopy;  Laterality: N/A;  . fibroid excision  age 64   prolapsed fibroid removed  . FINE NEEDLE ASPIRATION  10/22/2018   Procedure: FINE NEEDLE ASPIRATION;  Surgeon: Milus Banister, MD;  [facility];  Service: Endoscopy;;  . FOOT SURGERY Left 2014   Dr. Paulla Dolly  .  FOOT SURGERY Right 08/2016   Dr. Paulla Dolly --Shortening Osteotomy with Fixation 1st Metatarsal   . MOUTH SURGERY  06/2017   [facility]   . PORT-A-CATH REMOVAL N/A 02/15/2016   Procedure: REMOVAL PORT-A-CATH;  Surgeon: Autumn Messing III, MD;  Location: [facility];  Service: General;  Laterality: N/A;  . RADIOACTIVE SEED GUIDED PARTIAL MASTECTOMY WITH AXILLARY SENTINEL LYMPH NODE BIOPSY Left 05/25/2015   Procedure: LEFT BREAST LUMPECTOMY  WITH RADIOACTIVE SEED AND SENTINEL LYMPH NODE BIOPSY;  Surgeon: Autumn Messing III, MD;  Location: [facility];  Service: General;  Laterality: Left;  . UPPER GASTROINTESTINAL ENDOSCOPY      There were no vitals filed for this visit.   Subjective Assessment - 07/21/20 1128    Subjective Pt states she is doing much better; has not had much time to do exercises lately due to having gone to [place] to visit her father-in-law -- but states she will resume doing HEP    Pertinent History Hx of L breast CA (**no BP in LUE) s/p chemo/radiation, hx of lymphoma, foot surgery (bunions), depression, bipolar disorder, osteoporosis, plantar fasciitis, anemia    Patient Stated Goals For my head to clear up and for my balance to get where it was before.    Currently in Pain? No/denies                 Pt performed standing on blue mat - reaching down toward each foot, then reaching up in diagonal pattern to each side , with 180 degree Turns on mat 3 reps; then addd 360 degree turns for 4 reps; pt reported mild increase in dizziness and feelings of wooziness but was Able to continue with exercises - no rest break needed                 Balance Exercises - 07/21/20 0001      Balance Exercises: Standing   Standing Eyes Opened Narrow base of support (BOS);Head turns;Foam/compliant surface;5 reps   horizontal & vertical head turns on blue mat   Standing Eyes Closed Narrow base of support (BOS);Wide (BOA);Head turns;Foam/compliant surface;5 reps   horizontal and vertical head turns on blue mat   Tandem Gait Forward;1 rep   10' on floor   Retro Gait Head turns;2 reps;Foam/compliant surface   on blue and red mats on floor   Marching Foam/compliant surface;Static;Head turns   on incline and on decline with EO & EC   Other Standing Exercises Pt performed marching on incline - EO - with head turns horizontal and vertical; EC with no head turns, then adding horizontal and vertical head  turns 5 reps each; alternate stepping forward/back EO, adding horizontal head turns 5 reps ;     Other Standing Exercises Comments amb. making circles clockwise and counterclockwise with patterned ball 40' x 1 rep each direction for improved gaze stabilization                PT Short Term Goals - 07/21/20 1201      PT SHORT TERM GOAL #1   Title = LTG for 4 week POC             PT Long Term Goals - 07/21/20 1201      PT LONG TERM GOAL #1   Title Pt will progress with walking program to address symptoms with exertion by reporting ability to ambulate x 30 minutes outside on uneven pavement maintaining moderate pace (20-23 minute mile)    Baseline walking  30"-45" - unsure if 20-23' mile, has added 2lb weights, pace is not consistent    Time 4    Period Weeks    Status Revised      PT LONG TERM GOAL #2   Title Pt will demonstrate independence with progressed HEP    Time 4    Period Weeks    Status Revised      PT LONG TERM GOAL #3   Title Pt will report 1/5 dizziness overall with turns to R and turns to L on MSQ to indicate decreased motion sensitivity    Baseline 2/5 for body turns now; rest of movements 0 or 1/5    Time 4    Period Weeks    Status Revised      PT LONG TERM GOAL #4   Title Pt will improve FGA score to 30/30 to decr. falls risk.    Baseline 29/30    Time 4    Period Weeks    Status Revised      PT LONG TERM GOAL #5   Title Pt will demonstrate improved use of somatosensory information for standing balance as indicated on SOT by increase in SOM to >95% on sensory analysis (will maintain composite score of >77)    Baseline Sensory analysis for SOM: 90% - no change in somatosensory    Time 4    Period Weeks    Status Revised      PT LONG TERM GOAL #6   Title Improve DHI score to </= 10 to demo improvement in dizziness.    Baseline 18    Time 4    Period Weeks    Status Revised                 Plan - 07/21/20 1158    Clinical Impression  Statement Pt continues to progress well towards goals; plan D/C next session.  Pt reports she has most difficulty with walking backwards and with activities with EC, however, pt able to perform these activities and exercises with minimal LOB (able to recover LOB independently).    Personal Factors and Comorbidities Age;Comorbidity 3+;Past/Current Experience;Time since onset of injury/illness/exacerbation    Comorbidities Hx of L breast CA (**no BP in LUE) s/p chemo/radiation, hx of lymphoma, foot surgery (bunions), depression, bipolar disorder, osteoporosis, plantar fasciitis, anemia    Examination-Activity Limitations Bathing;Locomotion Level;Stairs;Transfers;Caring for Others   pt's sister will visit pt soon and she has to assist her sister to the bathroom and she's fearful of the steps   Examination-Participation Restrictions Cleaning;Yard Work;Interpersonal Relationship    Rehab Potential Good    PT Frequency 1x / week    PT Duration 4 weeks    PT Treatment/Interventions ADLs/Self Care Home Management;Aquatic Therapy;Biofeedback;Canalith Repostioning;DME Instruction;Balance training;Neuromuscular re-education;Manual techniques;Therapeutic exercise;Therapeutic activities;Functional mobility training;Patient/family education;Stair training;Gait training;Cognitive remediation;Vestibular;Cryotherapy;Moist Heat;Passive range of motion    PT Next Visit Plan D/C next session -- **NO BP IN LUE**   Recerted for final 3 goals to continue to address Balance on compliant surfaces, eyes closed, balance reactions, faster walking speeds - on inclines, grass; retro gait    PT Home Exercise Plan HEP condensed and reviewed on 9/17.  [id number] in [place] and Agree with Plan of Care Patient           Patient will benefit from skilled therapeutic intervention in order to improve the following deficits and impairments:  Abnormal gait, Dizziness, Decreased activity tolerance, Decreased knowledge of use  of DME, Decreased  balance, Decreased cognition, Decreased mobility, Decreased strength, Impaired sensation, Impaired flexibility  Visit Diagnosis: Unsteadiness on feet  Other abnormalities of gait and mobility  Dizziness and giddiness     Problem List Patient Active Problem List   Diagnosis Date Noted  . Cognitive deficits 05/16/2020  . Visual disturbance 05/16/2020  . Sleep disturbance 04/04/2020  . Post concussive syndrome 03/02/2020  . Dizziness and giddiness 03/02/2020  . Follicular lymphoma ([place]) 11/16/2018  . Duodenum disorder   . Upper gastrointestinal bleed 07/02/2018  . Abdominal pain, epigastric 07/01/2018  . Elevated lipase 07/01/2018  . Elevated LFTs 07/01/2018  . Iron deficiency anemia 08/17/2017  . C. difficile colitis 08/17/2017  . Dehydration 08/17/2017  . Hypokalemia 08/17/2017  . Symptomatic anemia 07/16/2017  . Bipolar 1 disorder ([place]) 07/16/2017  . Hypotension due to hypovolemia 07/16/2017  . Melena   . Heme positive stool   . Chronic duodenal ulcer with hemorrhage   . Blood transfusion without reported diagnosis 07/14/2017  . Osteoporosis 07/29/2016  . Genetic counseling and testing 01/29/2016  . Pain in joint, pelvic region and thigh 06/12/2015  . 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 Allergy to adhesive tape 01/25/2015  . Malignant neoplasm of upper-outer quadrant of left breast in female, estrogen receptor negative ([place]) 12/22/2014    Lasean Gorniak, [place], PT 07/21/2020, 12:03 PM  [facility] [address] [place], [place], [address] Phone: [phone]   Fax:  [phone]  Name: Rhonda Gardner MRN: [id number] Date of Birth: 21-Jul-1956

## 2020-07-24 ENCOUNTER — Encounter: Payer: Self-pay | Admitting: Physical Therapy

## 2020-07-24 ENCOUNTER — Other Ambulatory Visit: Payer: Self-pay

## 2020-07-24 ENCOUNTER — Ambulatory Visit: Payer: Medicare Other | Admitting: Physical Therapy

## 2020-07-24 DIAGNOSIS — R2681 Unsteadiness on feet: Secondary | ICD-10-CM

## 2020-07-24 DIAGNOSIS — R208 Other disturbances of skin sensation: Secondary | ICD-10-CM

## 2020-07-24 DIAGNOSIS — R42 Dizziness and giddiness: Secondary | ICD-10-CM

## 2020-07-24 DIAGNOSIS — R2689 Other abnormalities of gait and mobility: Secondary | ICD-10-CM

## 2020-07-24 NOTE — Therapy (Addendum)
[facility] [address] [place], [place], [address] Phone: [phone]   Fax:  [phone]  Physical Therapy Treatment and Discharge Summary  Patient Details  Name: Rhonda Gardner MRN: [id number] Date of Birth: 12/03/1955 Referring Provider (PT): Delice Lesch, MD   Encounter Date: 07/24/2020   PT End of Session - 07/24/20 1413    Visit Number 21    Number of Visits 22    Date for PT Re-Evaluation 07/30/20    Authorization Type Medicare: PN every 10th visit.    Progress Note Due on Visit 28   performed on visit 18   PT Start Time 0934    PT Stop Time 1020    PT Time Calculation (min) 46 min    Activity Tolerance Patient tolerated treatment well    Behavior During Therapy WFL for tasks assessed/performed           Past Medical History:  Diagnosis Date  . Allergy 2006   chemical cauterization in spring 2006, and 2nd treatment with good results.(Dr.Shoemaker)  . Bipolar disorder ([place])   . Bleeding ulcer   . Blood transfusion without reported diagnosis 07/2017   Bleeding ulcer  . Breast cancer ([place]) 12/2014   invasive ductal carcinoma (LEFT)  . Breast cancer of upper-outer quadrant of left female breast ([place]) 12/22/2014  . Breast hematoma 12/19/2015   had left breast aspiration of 4cm hematoma-path indicates benign hematoma  . C. difficile colitis   . C. difficile diarrhea   . Depression   . 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 Osteoporosis 06/20/2016   T-2.5_0  on Dexa ([facility])  . Plantar fasciitis   . PONV (postoperative nausea and vomiting)   . Symptomatic anemia 07/16/2017    Past Surgical History:  Procedure Laterality Date  . BIOPSY  07/03/2018   Procedure: BIOPSY;  Surgeon: Gatha Mayer, MD;  Location: [facility];  Service: Endoscopy;;  . BIOPSY  09/02/2018   Procedure: BIOPSY;  Surgeon: Gatha Mayer, MD;  Location: [facility];   Service: Endoscopy;;  . BIOPSY  10/22/2018   Procedure: BIOPSY;  Surgeon: Milus Banister, MD;  Location: [facility];  Service: Endoscopy;;  . BREAST BIOPSY Bilateral    benign and a right stereotatic breast biopsy.  . COLONOSCOPY    . ESOPHAGOGASTRODUODENOSCOPY N/A 07/16/2017   Procedure: ESOPHAGOGASTRODUODENOSCOPY (EGD);  Surgeon: Ladene Artist, MD;  Location: [facility] ENDOSCOPY;  Service: Endoscopy;  Laterality: N/A;  . ESOPHAGOGASTRODUODENOSCOPY N/A 10/22/2018   Procedure: ESOPHAGOGASTRODUODENOSCOPY (EGD);  Surgeon: Milus Banister, MD;  Location: [facility];  Service: Endoscopy;  Laterality: N/A;  . ESOPHAGOGASTRODUODENOSCOPY (EGD) WITH PROPOFOL N/A 07/03/2018   Procedure: ESOPHAGOGASTRODUODENOSCOPY (EGD) WITH PROPOFOL;  Surgeon: Gatha Mayer, MD;  Location: [facility];  Service: Endoscopy;  Laterality: N/A;  . ESOPHAGOGASTRODUODENOSCOPY (EGD) WITH PROPOFOL N/A 09/02/2018   Procedure: ESOPHAGOGASTRODUODENOSCOPY (EGD) WITH PROPOFOL;  Surgeon: Gatha Mayer, MD;  Location: [facility];  Service: Endoscopy;  Laterality: N/A;  . EUS N/A 10/22/2018   Procedure: UPPER ENDOSCOPIC ULTRASOUND (EUS) RADIAL;  Surgeon: Milus Banister, MD;  Location: [facility];  Service: Endoscopy;  Laterality: N/A;  . fibroid excision  age 82   prolapsed fibroid removed  . FINE NEEDLE ASPIRATION  10/22/2018   Procedure: FINE NEEDLE ASPIRATION;  Surgeon: Milus Banister, MD;  Location: [facility];  Service: Endoscopy;;  . FOOT SURGERY Left 2014   Dr. Paulla Dolly  . FOOT SURGERY Right 08/2016  Dr. Paulla Dolly --Shortening Osteotomy with Fixation 1st Metatarsal   . MOUTH SURGERY  06/2017   [facility]   . PORT-A-CATH REMOVAL N/A 02/15/2016   Procedure: REMOVAL PORT-A-CATH;  Surgeon: Autumn Messing III, MD;  Location: [facility];  Service: General;  Laterality: N/A;  . RADIOACTIVE SEED GUIDED PARTIAL MASTECTOMY WITH AXILLARY SENTINEL LYMPH NODE BIOPSY Left 05/25/2015   Procedure: LEFT BREAST LUMPECTOMY WITH  RADIOACTIVE SEED AND SENTINEL LYMPH NODE BIOPSY;  Surgeon: Autumn Messing III, MD;  Location: [facility];  Service: General;  Laterality: Left;  . UPPER GASTROINTESTINAL ENDOSCOPY      There were no vitals filed for this visit.   Subjective Assessment - 07/24/20 0940    Subjective Pt continues to do very well.  Still having a hard time with walking backwards and eyes closed.    Pertinent History Hx of L breast CA (**no BP in LUE) s/p chemo/radiation, hx of lymphoma, foot surgery (bunions), depression, bipolar disorder, osteoporosis, plantar fasciitis, anemia    Patient Stated Goals For my head to clear up and for my balance to get where it was before.    Currently in Pain? No/denies              [facility] PT Assessment - 07/24/20 0950      Assessment   Medical Diagnosis Postconcussion syndrome    Referring Provider (PT) Delice Lesch, MD    Onset Date/Surgical Date 03/03/19    Hand Dominance Right    Next MD Visit 04/04/20    Prior Therapy none      Precautions   Precautions Fall    Precaution Comments osteoporosis      Prior Function   Level of Independence Independent      Observation/Other Assessments   Focus on Therapeutic Outcomes (FOTO)  Not assessed    Other Surveys  Dizziness Handicap Inventory (DHI)    Dizziness Handicap Inventory [facility])  4      Functional Gait  Assessment   Gait assessed  Yes    Gait Level Surface Walks 20 ft in less than 5.5 sec, no assistive devices, good speed, no evidence for imbalance, normal gait pattern, deviates no more than 6 in outside of the 12 in walkway width.    Change in Gait Speed Able to smoothly change walking speed without loss of balance or gait deviation. Deviate no more than 6 in outside of the 12 in walkway width.    Gait with Horizontal Head Turns Performs head turns smoothly with no change in gait. Deviates no more than 6 in outside 12 in walkway width    Gait with Vertical Head Turns Performs head turns with no change  in gait. Deviates no more than 6 in outside 12 in walkway width.    Gait and Pivot Turn Pivot turns safely within 3 sec and stops quickly with no loss of balance.    Step Over Obstacle Is able to step over 2 stacked shoe boxes taped together (9 in total height) without changing gait speed. No evidence of imbalance.    Gait with Narrow Base of Support Is able to ambulate for 10 steps heel to toe with no staggering.    Gait with Eyes Closed Walks 20 ft, uses assistive device, slower speed, mild gait deviations, deviates 6-10 in outside 12 in walkway width. Ambulates 20 ft in less than 9 sec but greater than 7 sec.    Ambulating Backwards Walks 20 ft, no assistive devices, good speed, no  evidence for imbalance, normal gait    Steps Alternating feet, no rail.    Total Score 29    FGA comment: 29/30               Vestibular Assessment - 07/24/20 0946      Balancemaster   Balancemaster Sensory organization test    Balancemaster Comment Composite score of 81; all systems WNL      Positional Sensitivities   Nose to Right Knee Lightheadedness    Right Knee to Sitting Lightedness    Nose to Left Knee Lightheadedness    Left Knee to Sitting Lightheadedness    Head Turning x 5 No dizziness    Head Nodding x 5 No dizziness    Pivot Right in Standing No dizziness    Pivot Left in Standing No dizziness            Conditions: 1: 3 trials WNL 2: 3 trials WNL 3:  3 trials WNL 4: 3 trials WNL 5: 3 trials WNL 6: 3 trials WNL Composite score: 81 Sensory Analysis Som: WNL Vis: WNL Vest: WNL Pref: WNL Strategy analysis: 75-100% hip/ankle strategy COG alignment: Pt with COG more forward in midline but slightly to the R.     PT Education - 07/24/20 1412    Education Details progress towards goals and D/C today    Person(s) Educated Patient    Methods Explanation    Comprehension Verbalized understanding            PT Short Term Goals - 07/21/20 1201      PT SHORT TERM GOAL  #1   Title = LTG for 4 week POC             PT Long Term Goals - 07/24/20 0944      PT LONG TERM GOAL #1   Title Pt will progress with walking program to address symptoms with exertion by reporting ability to ambulate x 30 minutes outside on uneven pavement maintaining moderate pace (20-23 minute mile)    Baseline 30-45 minutes at faster pace, every day.    Time 4    Period Weeks    Status Achieved      PT LONG TERM GOAL #2   Title Pt will demonstrate independence with progressed HEP    Time 4    Period Weeks    Status Achieved      PT LONG TERM GOAL #3   Title Pt will report 1/5 dizziness overall with turns to R and turns to L on MSQ to indicate decreased motion sensitivity    Baseline no dizziness with turns, mild symptoms with bending down to ground    Time 4    Period Weeks    Status Partially Met      PT LONG TERM GOAL #4   Title Pt will improve FGA score to 30/30 to decr. falls risk.    Baseline 29/30 no change since STG due to ongoing difficulty with EC    Time 4    Period Weeks    Status Partially Met      PT LONG TERM GOAL #5   Title Pt will demonstrate improved use of somatosensory information for standing balance as indicated on SOT by increase in SOM to >95% on sensory analysis (will maintain composite score of >77)    Baseline --    Time 4    Period Weeks    Status Achieved      PT LONG TERM GOAL #  6   Title Improve DHI score to </= 10 to demo improvement in dizziness.    Baseline 4    Time 4    Period Weeks    Status Achieved                 Plan - 07/24/20 1416    Clinical Impression Statement Assessed patient's progress towards LTG.  Pt has made good progress and has met 4/6 LTG, partially meeting other two.  Pt demonstrates improved motion sensitivity but continues to experience very faint symptoms with bending down to ground.  Pt demonstrates good adaptation to walking at faster speeds and is now able to maintain for 30-45 minutes.  Pt's  overall DHI has decreased to 4% and patient is now demonstrating WNL sensory integration with significant improvement in use of somatosensory information.  Pt's FGA score did not change since STG check due to increased time to walk with EC but pt demonstrated improved balance with little to no veering with EC.  Educated pt on ways to manage symptoms as she increases activity level and monitoring symptom intensity.  Pt is safe and ready for D/C and has demonstrated independence with HEP and walking program.    Personal Factors and Comorbidities Age;Comorbidity 3+;Past/Current Experience;Time since onset of injury/illness/exacerbation    Comorbidities Hx of L breast CA (**no BP in LUE) s/p chemo/radiation, hx of lymphoma, foot surgery (bunions), depression, bipolar disorder, osteoporosis, plantar fasciitis, anemia    Examination-Activity Limitations Bathing;Locomotion Level;Stairs;Transfers;Caring for Others   pt's sister will visit pt soon and she has to assist her sister to the bathroom and she's fearful of the steps   Examination-Participation Restrictions Cleaning;Yard Work;Interpersonal Relationship    Rehab Potential Good    PT Frequency 1x / week    PT Duration 4 weeks    PT Treatment/Interventions ADLs/Self Care Home Management;Aquatic Therapy;Biofeedback;Canalith Repostioning;DME Instruction;Balance training;Neuromuscular re-education;Manual techniques;Therapeutic exercise;Therapeutic activities;Functional mobility training;Patient/family education;Stair training;Gait training;Cognitive remediation;Vestibular;Cryotherapy;Moist Heat;Passive range of motion    PT Next Visit Plan D/C    PT Home Exercise Plan HEP condensed and reviewed on 9/17.  [id number] in [place] and Agree with Plan of Care Patient           Patient will benefit from skilled therapeutic intervention in order to improve the following deficits and impairments:  Abnormal gait, Dizziness, Decreased activity  tolerance, Decreased knowledge of use of DME, Decreased balance, Decreased cognition, Decreased mobility, Decreased strength, Impaired sensation, Impaired flexibility  Visit Diagnosis: Unsteadiness on feet  Other abnormalities of gait and mobility  Dizziness and giddiness  Other disturbances of skin sensation     Problem List Patient Active Problem List   Diagnosis Date Noted  . Cognitive deficits 05/16/2020  . Visual disturbance 05/16/2020  . Sleep disturbance 04/04/2020  . Post concussive syndrome 03/02/2020  . Dizziness and giddiness 03/02/2020  . Follicular lymphoma ([place]) 11/16/2018  . Duodenum disorder   . Upper gastrointestinal bleed 07/02/2018  . Abdominal pain, epigastric 07/01/2018  . Elevated lipase 07/01/2018  . Elevated LFTs 07/01/2018  . Iron deficiency anemia 08/17/2017  . C. difficile colitis 08/17/2017  . Dehydration 08/17/2017  . Hypokalemia 08/17/2017  . Symptomatic anemia 07/16/2017  . Bipolar 1 disorder ([place]) 07/16/2017  . Hypotension due to hypovolemia 07/16/2017  . Melena   . Heme positive stool   . Chronic duodenal ulcer with hemorrhage   . Blood transfusion without reported diagnosis 07/14/2017  . Osteoporosis 07/29/2016  . Genetic counseling and testing 01/29/2016  .  Pain in joint, pelvic region and thigh 06/12/2015  . 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 Allergy to adhesive tape 01/25/2015  . Malignant neoplasm of upper-outer quadrant of left breast in female, estrogen receptor negative ([place]) 12/22/2014   PHYSICAL THERAPY DISCHARGE SUMMARY  Visits from Start of Care: 21  Current functional level related to goals / functional outcomes: Please see impression statement and LTG achievement above.   Remaining deficits: Mild motion sensitivity and imbalance   Education / Equipment: HEP and walking program   Plan: Patient agrees to discharge.  Patient goals were met. Patient is being discharged due to meeting the stated  rehab goals.  ?????     Rico Junker, PT, DPT 07/24/20    2:28 PM    [facility] [address] [place], [place], [address] Phone: [phone]   Fax:  [phone]  Name: Caydence Koenig Gardner MRN: [id number] Date of Birth: Oct 01, 1956

## 2020-08-07 ENCOUNTER — Other Ambulatory Visit: Payer: Medicare Other

## 2020-08-07 ENCOUNTER — Other Ambulatory Visit: Payer: Self-pay

## 2020-08-07 DIAGNOSIS — R7989 Other specified abnormal findings of blood chemistry: Secondary | ICD-10-CM

## 2020-08-07 DIAGNOSIS — E038 Other specified hypothyroidism: Secondary | ICD-10-CM

## 2020-08-08 LAB — TSH: TSH: 3.82 u[IU]/mL (ref 0.450–4.500)

## 2020-08-08 LAB — THYROID PEROXIDASE ANTIBODY: Thyroperoxidase Ab SerPl-aCnc: 10 IU/mL (ref 0–34)

## 2020-08-14 ENCOUNTER — Other Ambulatory Visit: Payer: Self-pay | Admitting: Medical

## 2020-08-14 DIAGNOSIS — F3170 Bipolar disorder, currently in remission, most recent episode unspecified: Secondary | ICD-10-CM

## 2020-12-25 ENCOUNTER — Encounter: Payer: Self-pay | Admitting: Oncology

## 2021-02-05 LAB — HM MAMMOGRAPHY

## 2021-02-05 LAB — HM DEXA SCAN

## 2021-02-07 ENCOUNTER — Encounter: Payer: Self-pay | Admitting: *Deleted

## 2021-02-20 ENCOUNTER — Encounter: Payer: Self-pay | Admitting: Family Medicine

## 2021-04-27 NOTE — Patient Instructions (Addendum)
  HEALTH MAINTENANCE RECOMMENDATIONS:  It is recommended that you get at least 30 minutes of aerobic exercise at least 5 days/week (for weight loss, you may need as much as 60-90 minutes). This can be any activity that gets your heart rate up. This can be divided in 10-15 minute intervals if needed, but try and build up your endurance at least once a week.  Weight bearing exercise is also recommended twice weekly.  Eat a healthy diet with lots of vegetables, fruits and fiber.  "Colorful" foods have a lot of vitamins (ie green vegetables, tomatoes, red peppers, etc).  Limit sweet tea, regular sodas and alcoholic beverages, all of which has a lot of calories and sugar.  Up to 1 alcoholic drink daily may be beneficial for women (unless trying to lose weight, watch sugars).  Drink a lot of water.  Calcium recommendations are 1200-1500 mg daily (1500 mg for postmenopausal women or women without ovaries), and vitamin D 1000 IU daily.  This should be obtained from diet and/or supplements (vitamins), and calcium should not be taken all at once, but in divided doses.  Monthly self breast exams and yearly mammograms for women over the age of 20 is recommended.  Sunscreen of at least SPF 30 should be used on all sun-exposed parts of the skin when outside between the hours of 10 am and 4 pm (not just when at beach or pool, but even with exercise, golf, tennis, and yard work!)  Use a sunscreen that says "broad spectrum" so it covers both UVA and UVB rays, and make sure to reapply every 1-2 hours.  Remember to change the batteries in your smoke detectors when changing your clock times in the spring and fall. Carbon monoxide detectors are recommended for your home.  Use your seat belt every time you are in a car, and please drive safely and not be distracted with cell phones and texting while driving.   Rhonda Gardner , Thank you for taking time to come for your Medicare Wellness Visit. I appreciate your ongoing  commitment to your health goals. Please review the following plan we discussed and let me know if I can assist you in the future.   This is a list of the screening recommended for you and due dates:  Health Maintenance  Topic Date Due   COVID-19 Vaccine (4 - Booster for Pfizer series) 10/09/2020   Flu Shot  05/14/2021   Tetanus Vaccine  01/21/2023   Mammogram  02/05/2022   Colon Cancer Screening  07/08/2026   DEXA scan (bone density measurement)  Completed   Hepatitis C Screening: USPSTF Recommendation to screen - Ages 39-79 yo.  Completed   Pneumonia vaccines  Completed   Zoster (Shingles) Vaccine  Completed   HPV Vaccine  Aged Out   Next bone density test will be due in 01/2023 (every 2 years)  At your convenience, please get [place] copies of your living will and healthcare power of attorney to be scanned into your chart.  Please schedule a routine eye exam.

## 2021-04-27 NOTE — Progress Notes (Signed)
Chief Complaint  Patient presents with   Medicare Wellness    Fasting annual wellness with pelvic. No concerns. Has not heard from Dr. Jana Hakim as of yet. Tested positive for covid on 04/19/21, so does not want 2nd booster today.     Rhonda Gardner is a 65 y.o. female who presents for Annual Wellness visit and follow-up on chronic medical conditions.    She and her husband had COVID earlier this month.  She describes it as flu-like symptoms, had full recovery.  H/o duodenal, low-grade follicular lymphoma, diagnosed 10/2018.  She was treated with radiation.  Post-treatment endoscopy and biopsy showed no evidence of residual lymphoma. She last saw Dr. Merleen Nicely in follow up in 09/2020, and 1 year f/u was recommended.    H/o breast Cancer: She had been under the care of Dr. Jana Hakim, now released to PCP care.  Denies any breast concerns.   Osteoporosis:  This has been managed by Dr. Jana Hakim with zoledronate infusions, every 6 months. Per chart, appears to have been started 08/2016.  DEXA in 06/2018 showed T-1.8 at spine.  Dr. Jana Hakim had said to hold off on next dose until next DEXA, which was done 01/2021. This showed slight decline in spine, T-2.1 (up from T-1.8 in 2019, had been T-2.5 in 06/2016).  We forwarded copy to Dr. Jana Hakim, pt didn't hear anything re: recommendations. She takes Calcium tablet twice daily, drinks milk daily, and eats kale most days.  Takes 3000 IU of vitamin D3 currently (sometimes takes 5000 IU, depending what she buys).  H/o skull fracture in 02/2019 related to a fall.  She lost her sense of smell and taste, but this is still improving, still not back to normal. Can no longer have chocolate or coffee as these taste bad.  She had persistent issues with balance, dizziness, aphasia, saw neuro, diagnosed with post-concussive syndrome, and got rehab, which she completed in the Fall. Balance is much better, but thinks recently noting some balance problems again, plans to  restart her home exercises.  Bipolar disorder:  Diagnosed age 33 or 57.  She has been stable on her current regimen of lamictal.  Moods are well controlled, no side effects.  She previously was under the care of Dr. Letta Moynahan and Gala Murdoch (who retired).  We have been managing these medications, and she continues to do well. Hadn't tolerated generic lamictal in the [place] in the past (myalgias with generics here); currently is on a particular generic and is doing well, without side effects.  Moods are good.  Subclinical hypothyroidism:  TSH was mildly elevated last year,  at 5.270.  Recheck 3 months later was normal, and TPO was negative. She denies changes in hair/skin/bowels/moods/energy/weight.  Lab Results  Component Value Date   TSH 3.820 08/07/2020     Immunization History  Administered Date(s) Administered   DTaP 10/14/1954, 10/15/1955, 10/15/1959   Fluad Quad(high Dose 65+) 07/08/2019, 07/10/2020   Gamma Globulin 08/23/1988   Hepatitis A 11/02/1996, 09/29/2002   Hepatitis A, Adult 11/02/1996, 09/29/2002   Hepatitis B 11/11/2007   Hepatitis B, adult 11/11/2007   IPV 08/15/1988   Influenza Split 11/11/2007, 07/13/2013   Influenza,inj,Quad PF,6+ Mos 05/30/2016, 07/17/2017, 07/04/2018   Influenza-Unspecified 07/31/2015, 05/30/2016, 07/17/2017   MMR 04/29/2003   Measles 07/25/1988   Meningococcal Polysaccharide 07/08/1988   PFIZER(Purple Top)SARS-COV-2 Vaccination 11/06/2019, 11/27/2019, 07/10/2020   Pneumococcal Conjugate-13 04/08/2016   Pneumococcal Polysaccharide-23 04/27/2020   Rubella 07/25/1988   Td 10/15/1987, 11/02/1996, 01/23/1998   Tdap 11/11/2007, 01/20/2013  Typhoid Live 11/11/2007   Typhoid Parenteral 07/25/1988, 08/23/1988   Zoster Recombinat (Shingrix) 12/04/2017, 02/19/2018   Zoster, Live 12/01/2014   Last Pap smear:04/2019--normal, no high risk HPV detected Last mammogram: 01/2021 Last colonoscopy:06/2016--normal, internal hemorrhoids Last  DEXA: 01/2021 T-2.1 spine (06/2018 T-1.8 at spine, improved from 06/2016 T-2.5) Dentist: every 6 months Ophtho: yearly, but admits she is overdue Exercise:  Walks 5x/week with a neighbor, x 1 hr (starts out brisk, but slows due to friend). Walks with weights 2x/week. Vitamin D-OH 48.8 in 04/2020 Lipids: Lab Results  Component Value Date   CHOL 212 (H) 04/27/2020   HDL 106 04/27/2020   LDLCALC 89 04/27/2020   TRIG 99 04/27/2020   CHOLHDL 2.0 04/27/2020    Other doctors caring for patient include:  Dr. Velora Mediate at [facility] (lymphoma) Dr. Donell Beers oncology at [facility] Dr. Rhys Martini at [facility] (did EGD) Dr. Harriet Pho (breast CA); had also seen Dr. Irene Limbo for lymphoma prior to going to [facility]. No longer followed by oncology at [facility] Dr. Thea Gist (saw for loss of taste after fall); has also seen Dr. Janace Hoard in the past (for cerumen impaction) Dr. Carlos American Dr. Patel--PMR (no longer sees) Dr. Edison Nasuti, Dr. Delrae Rend at [facility]  Dr. Regal--podiatrist Dr. Tommi Rumps Dr. Satira Sark, Dr. Shelia Media Dr. Iona Hansen  Depression screen: negative Fall screen: none Functional status survey: notable for some slight decrease in hearing. Slight balance issues, no falls. Mini-Cog screen: normal  See epic for full screens  End of Life Discussion:  Patient has a living will and medical power of attorney  PMH, PSH, SH and FH were reviewed and updated  Outpatient Encounter Medications as of 04/30/2021  Medication Sig Note   Alpha-Lipoic Acid 600 MG TABS Take by mouth.    Calcium-Magnesium (CALCIUM MAGNESIUM 750) 300-300 MG TABS Take by mouth. Bid    carboxymethylcellulose (REFRESH PLUS) 0.5 % SOLN Place 1 drop into both eyes 3 (three) times daily as needed (dry eyes.).    Cholecalciferol (VITAMIN D-3) 125 MCG (5000 UT) TABS Take 5,000 Units by mouth daily. 04/14/2019: 3000-5000 daily   lamoTRIgine (LAMICTAL) 200 MG tablet Take 1/2 (one-half) tablet by mouth once daily     Lutein-Zeaxanthin 25-5 MG CAPS Take 1 capsule by mouth daily.    ibuprofen (ADVIL) 200 MG tablet Take 200 mg by mouth every 6 (six) hours as needed. (Patient not taking: Reported on 04/30/2021)    [DISCONTINUED] acetaminophen (TYLENOL) 325 MG tablet Take 650 mg by mouth every 6 (six) hours as needed (for pain.).  (Patient not taking: Reported on 04/30/2021)    [DISCONTINUED] ascorbic acid (VITAMIN C) 500 MG tablet Take by mouth.    [DISCONTINUED] fluticasone (FLONASE) 50 MCG/ACT nasal spray Place 1 spray into both nostrils daily.     [DISCONTINUED] ondansetron (ZOFRAN ODT) 4 MG disintegrating tablet Take 1 tablet (4 mg total) by mouth every 8 (eight) hours as needed for nausea.    [DISCONTINUED] promethazine (PHENERGAN) 25 MG tablet Take 1 tablet (25 mg total) by mouth every 6 (six) hours as needed for nausea or vomiting. 03/03/2019: Took 1/2 tablet    [DISCONTINUED] Zoledronic Acid (ZOMETA IV) Inject 1 Dose into the vein every 6 (six) months.  04/27/2020: Last received 10/2019   No facility-administered encounter medications on file as of 04/30/2021.   Allergies  Allergen Reactions   Clindamycin/Lincomycin Diarrhea    Resulted in C-diff   Other Nausea Only    Stronger pain meds   Adhesive [Tape] Rash    Pt is sensitive to any kind  of adhesive tape products.    ROS:  The patient denies anorexia, fever, headaches,  decreased hearing, ear pain, sore throat, breast concerns, chest pain, palpitations, dizziness, syncope, dyspnea on exertion, cough, swelling, nausea, vomiting, diarrhea, constipation, abdominal pain, melena, hematochezia (rare), indigestion/heartburn, hematuria, incontinence, dysuria, vaginal bleeding, discharge, odor or itch, genital lesions, joint pains, numbness, tingling), weakness, tremor, suspicious skin lesions, depression, anxiety, abnormal bleeding/bruising, or enlarged lymph nodes. Vaginal dryness with intercourse, controlled with lubricants Some balance problems starting to  recur, plans to restart home exercises. Smell and taste still improving. Nocturia 3-4x (related to fluids at night), unchanged.   PHYSICAL EXAM:  BP 110/72   Pulse 84   Ht 5' 6.5" (1.689 m)   Wt 135 lb 9.6 oz (61.5 kg)   BMI 21.56 kg/m   Wt Readings from Last 3 Encounters:  04/30/21 135 lb 9.6 oz (61.5 kg)  05/16/20 131 lb (59.4 kg)  04/27/20 129 lb 3.2 oz (58.6 kg)    General Appearance:     Alert, cooperative, no distress, appears stated age, in good spirits.  Head:     Normocephalic, without obvious abnormality, atraumatic   Eyes:     PERRL, conjunctiva/corneas clear, EOM's intact, fundi  benign   Ears:     Normal TM's and external ear canals.   Nose:    Not examined (wearing mask due to COVID-19 pandemic)  Throat:    Not examined (wearing mask due to COVID-19 pandemic)  Neck:    Supple, no lymphadenopathy;  thyroid:  no enlargement/ tenderness/nodules; no carotid bruit or JVD   Back:     Spine nontender, no curvature, ROM normal, no CVA tenderness   Lungs:      Clear to auscultation bilaterally without wheezes, rales or ronchi; respirations unlabored   Chest Wall:     No tenderness or deformity   Heart:     Regular rate and rhythm, S1 and S2 normal, no murmur, rub   or gallop   Breast Exam:     No tenderness, nipple discharge or inversion. No axillary lymphadenopathy. WHSS at left lateral breast. No masses.  Abdomen:      Soft, non-tender, nondistended, normoactive bowel sounds,     no masses, no hepatosplenomegaly   Genitalia:     Normal external genitalia without lesions, mild atrophic changes.  BUS and vagina normal; no cervical motion tenderness. No abnormal vaginal discharge. Pap not obtained. Uterus and adnexa not enlarged, nontender, no masses.  Rectal:     Normal tone, no masses or tenderness; heme negative stool  Extremities:    No clubbing, cyanosis or edema   Pulses:    2+ and symmetric all extremities   Skin:    Skin color, texture, turgor normal, no rashes or  lesions.  Lymph nodes:    Cervical, supraclavicular, and axillary nodes normal   Neurologic:    Normal strength, sensation and gait; reflexes 2+ and symmetric throughout                 Psych:   Normal mood, affect, hygiene and grooming    ASSESSMENT/PLAN:  Medicare annual wellness visit, initial  Bipolar disorder in full remission, most recent episode unspecified type ([place]) - controlled with lamictal, continue  Other osteoporosis without current pathological fracture - s/p treatment with zoledronate x 2 years with Dr. Jana Hakim.  Some slight decline in bone density, osteopenic  History of breast cancer - mammo UTD, no breast concerns - Plan: Comprehensive metabolic panel, CBC with Differential/Platelet  History of lymphoma - s/p radiation, doing well.   - Plan: Comprehensive metabolic panel, CBC with Differential/Platelet  Subclinical hypothyroidism - TSH elevated last year.  Repeat was normal, and TPO negative.  - Plan: TSH  Medication monitoring encounter - Plan: Comprehensive metabolic panel, CBC with Differential/Platelet  CBC, C-met, TSH  Discussed monthly self breast exams and yearly mammograms; at least 30 minutes of aerobic activity at least 5 days/week, weight-bearing exercise at least 2x/week; proper sunscreen use reviewed; healthy diet, including goals of calcium and vitamin D intake and alcohol recommendations (less than or equal to 1 drink/day) reviewed; regular seatbelt use; changing batteries in smoke detectors.  Immunization recommendations discussed--continue yearly flu shots. 2nd COVID booster recommended, can postpone de to recent COVID. Colonoscopy recommendations reviewed--UTD.  Living Will and healthcare POA--asked to get [place] a copy for chart.  MOST form reviewed and completed, Full Code, Full Care.    Medicare Attestation I have personally reviewed: [name] medical and social history Their use of alcohol, tobacco or illicit drugs Their current  medications and supplements [name] functional ability including ADLs,fall risks, home safety risks, cognitive, and hearing and visual impairment Diet and physical activities Evidence for depression or mood disorders  [name] weight, height, BMI have been recorded in the chart.  I have made referrals, counseling, and provided education to the patient based on review of the above and I have provided the patient with a written personalized care plan for preventive services.

## 2021-04-30 ENCOUNTER — Encounter: Payer: Self-pay | Admitting: Family Medicine

## 2021-04-30 ENCOUNTER — Other Ambulatory Visit: Payer: Self-pay

## 2021-04-30 ENCOUNTER — Ambulatory Visit (INDEPENDENT_AMBULATORY_CARE_PROVIDER_SITE_OTHER): Payer: Medicare Other | Admitting: Family Medicine

## 2021-04-30 VITALS — BP 110/72 | HR 84 | Ht 66.5 in | Wt 135.6 lb

## 2021-04-30 DIAGNOSIS — Z0000 Encounter for general adult medical examination without abnormal findings: Secondary | ICD-10-CM | POA: Diagnosis not present

## 2021-04-30 DIAGNOSIS — E038 Other specified hypothyroidism: Secondary | ICD-10-CM

## 2021-04-30 DIAGNOSIS — Z8579 Personal history of other malignant neoplasms of lymphoid, hematopoietic and related tissues: Secondary | ICD-10-CM

## 2021-04-30 DIAGNOSIS — Z853 Personal history of malignant neoplasm of breast: Secondary | ICD-10-CM

## 2021-04-30 DIAGNOSIS — M818 Other osteoporosis without current pathological fracture: Secondary | ICD-10-CM

## 2021-04-30 DIAGNOSIS — F3170 Bipolar disorder, currently in remission, most recent episode unspecified: Secondary | ICD-10-CM | POA: Diagnosis not present

## 2021-04-30 DIAGNOSIS — Z5181 Encounter for therapeutic drug level monitoring: Secondary | ICD-10-CM

## 2021-05-01 ENCOUNTER — Encounter: Payer: Self-pay | Admitting: Hematology

## 2021-05-01 LAB — COMPREHENSIVE METABOLIC PANEL
ALT: 30 IU/L (ref 0–32)
AST: 31 IU/L (ref 0–40)
Albumin/Globulin Ratio: 2.2 (ref 1.2–2.2)
Albumin: 4.6 g/dL (ref 3.8–4.8)
Alkaline Phosphatase: 52 IU/L (ref 44–121)
BUN/Creatinine Ratio: 16 (ref 12–28)
BUN: 12 mg/dL (ref 8–27)
Bilirubin Total: 0.2 mg/dL (ref 0.0–1.2)
CO2: 25 mmol/L (ref 20–29)
Calcium: 9.2 mg/dL (ref 8.7–10.3)
Chloride: 105 mmol/L (ref 96–106)
Creatinine, Ser: 0.76 mg/dL (ref 0.57–1.00)
Globulin, Total: 2.1 g/dL (ref 1.5–4.5)
Glucose: 83 mg/dL (ref 65–99)
Potassium: 4.9 mmol/L (ref 3.5–5.2)
Sodium: 145 mmol/L — ABNORMAL HIGH (ref 134–144)
Total Protein: 6.7 g/dL (ref 6.0–8.5)
eGFR: 86 mL/min/{1.73_m2} (ref >59)

## 2021-05-01 LAB — CBC WITH DIFFERENTIAL/PLATELET
Basophils Absolute: 0 10*3/uL (ref 0.0–0.2)
Basos: 1 %
EOS (ABSOLUTE): 0.1 10*3/uL (ref 0.0–0.4)
Eos: 2 %
Hematocrit: 40.3 % (ref 34.0–46.6)
Hemoglobin: 13.2 g/dL (ref 11.1–15.9)
Immature Grans (Abs): 0 10*3/uL (ref 0.0–0.1)
Immature Granulocytes: 0 %
Lymphocytes Absolute: 0.6 10*3/uL — ABNORMAL LOW (ref 0.7–3.1)
Lymphs: 13 %
MCH: 31.2 pg (ref 26.6–33.0)
MCHC: 32.8 g/dL (ref 31.5–35.7)
MCV: 95 fL (ref 79–97)
Monocytes Absolute: 0.5 10*3/uL (ref 0.1–0.9)
Monocytes: 10 %
Neutrophils Absolute: 3.4 10*3/uL (ref 1.4–7.0)
Neutrophils: 74 %
Platelets: 228 10*3/uL (ref 150–450)
RBC: 4.23 x10E6/uL (ref 3.77–5.28)
RDW: 12.2 % (ref 11.7–15.4)
WBC: 4.7 10*3/uL (ref 3.4–10.8)

## 2021-05-01 LAB — TSH: TSH: 3.84 u[IU]/mL (ref 0.450–4.500)

## 2021-05-07 ENCOUNTER — Other Ambulatory Visit: Payer: Self-pay | Admitting: Oncology

## 2021-05-26 ENCOUNTER — Other Ambulatory Visit: Payer: Self-pay | Admitting: Family Medicine

## 2021-05-26 DIAGNOSIS — F3170 Bipolar disorder, currently in remission, most recent episode unspecified: Secondary | ICD-10-CM

## 2021-05-28 NOTE — Telephone Encounter (Signed)
Pt. Last apt was 04/30/21 and next apt is 05/06/22.

## 2021-05-28 NOTE — Telephone Encounter (Signed)
Received a request for a refill on the pts. Lamictal pt.

## 2021-10-29 ENCOUNTER — Encounter: Payer: Self-pay | Admitting: Family Medicine

## 2021-11-21 ENCOUNTER — Ambulatory Visit (INDEPENDENT_AMBULATORY_CARE_PROVIDER_SITE_OTHER): Payer: Medicare Other

## 2021-11-21 ENCOUNTER — Ambulatory Visit (INDEPENDENT_AMBULATORY_CARE_PROVIDER_SITE_OTHER): Payer: Medicare Other | Admitting: Podiatry

## 2021-11-21 ENCOUNTER — Other Ambulatory Visit: Payer: Self-pay

## 2021-11-21 DIAGNOSIS — L84 Corns and callosities: Secondary | ICD-10-CM

## 2021-11-21 DIAGNOSIS — M2041 Other hammer toe(s) (acquired), right foot: Secondary | ICD-10-CM

## 2021-11-21 NOTE — Progress Notes (Signed)
Subjective:   Patient ID: Rhonda Gardner, female   DOB: 66 y.o.   MRN: [id number]   HPI Patient presents stating she has a bad corn on the inside of the fourth toe right has been very sore.  Its been there for a long time and has worsened recently and makes it hard for her to wear shoe gear comfortably.  Patient states that this has been an ongoing issue for her and she feels like the toe is rotated.  Patient does not smoke likes to be active   Review of Systems  All other systems reviewed and are negative.      Objective:  Physical Exam Vitals and nursing note reviewed.  Constitutional:      Appearance: She is well-developed.  Pulmonary:     Effort: Pulmonary effort is normal.  Musculoskeletal:        General: Normal range of motion.  Skin:    General: Skin is warm.  Neurological:     Mental Status: She is alert.    Neurovascular status intact muscle strength found to be adequate range of motion within normal limits.  Patient is found to have rotation of the fourth digit right with distal medial exostotic lesion secondary to the structural abnormality of the toe.  Patient's found to have good digital perfusion well oriented x3     Assessment:  Digital hammertoe fourth digit right with keratotic lesion inside the toe secondary to the structural malalignment of the toe     Plan:  H&P reviewed condition and recommended derotational arthroplasty digit for right to take pressure off the toe and patient wants surgery.  I allowed her to read consent form for correction explained procedure risk and recovery of approximate 3 to 6 months.  She is went except risk signs consent form scheduled for outpatient surgery with all questions answered today  X-rays indicate there is significant rotation digit 4 right pressing against the third toe

## 2021-12-03 MED ORDER — HYDROCODONE-ACETAMINOPHEN 10-325 MG PO TABS: 1.0000 | ORAL_TABLET | Freq: Three times a day (TID) | ORAL | 0 refills | Status: AC | PRN

## 2021-12-03 NOTE — Addendum Note (Signed)
Addended by: Wallene Huh on: 12/03/2021 02:48 PM   Modules accepted: Orders

## 2021-12-04 ENCOUNTER — Encounter: Payer: Self-pay | Admitting: Podiatry

## 2021-12-04 DIAGNOSIS — M2041 Other hammer toe(s) (acquired), right foot: Secondary | ICD-10-CM | POA: Diagnosis not present

## 2021-12-04 HISTORY — PX: HAMMER TOE SURGERY: SHX385

## 2021-12-10 ENCOUNTER — Other Ambulatory Visit: Payer: Self-pay

## 2021-12-10 ENCOUNTER — Ambulatory Visit (INDEPENDENT_AMBULATORY_CARE_PROVIDER_SITE_OTHER): Payer: Medicare Other | Admitting: Podiatry

## 2021-12-10 DIAGNOSIS — M2041 Other hammer toe(s) (acquired), right foot: Secondary | ICD-10-CM

## 2021-12-11 NOTE — Progress Notes (Signed)
Subjective:   Patient ID: Rhonda Gardner, female   DOB: 66 y.o.   MRN: [id number]   HPI Patient presents stating that she is doing very well with surgery   ROS      Objective:  Physical Exam  Neurovascular status intact negative [name]' sign noted incision site fourth digit right healing well wound edges well coapted toe in good alignment     Assessment:  Doing well post arthroplasty digit 4 right     Plan:  Sterile dressing reapplied continue elevation compression reappoint to recheck 2 weeks suture removal earlier if needed

## 2021-12-19 ENCOUNTER — Ambulatory Visit: Payer: Medicare Other | Admitting: Podiatry

## 2021-12-24 ENCOUNTER — Other Ambulatory Visit: Payer: Self-pay

## 2021-12-24 ENCOUNTER — Ambulatory Visit (INDEPENDENT_AMBULATORY_CARE_PROVIDER_SITE_OTHER): Payer: Medicare Other

## 2021-12-24 ENCOUNTER — Encounter: Payer: Self-pay | Admitting: Podiatry

## 2021-12-24 ENCOUNTER — Ambulatory Visit (INDEPENDENT_AMBULATORY_CARE_PROVIDER_SITE_OTHER): Payer: Medicare Other | Admitting: Podiatry

## 2021-12-24 DIAGNOSIS — M2041 Other hammer toe(s) (acquired), right foot: Secondary | ICD-10-CM

## 2021-12-24 DIAGNOSIS — Z9889 Other specified postprocedural states: Secondary | ICD-10-CM

## 2021-12-24 NOTE — Progress Notes (Signed)
Subjective:  ? ?Patient ID: Rhonda Gardner, female   DOB: 66 y.o.   MRN: [id number]  ? ?HPI ?Patient presents stating doing great with surgery very pleased ? ? ?ROS ? ? ?   ?Objective:  ?Physical Exam  ?Neurovascular status intact negative [name]' sign noted fourth digit right excellent alignment stitches intact wound edges coapted well ? ?   ?Assessment:  ?Doing well post distal arthroplasty digit 4 right ? ?   ?Plan:  ?Stitches are removed wound edges coapted well patient is allowed to return to normal shoe gear discharged and encouraged to call questions concerns ? ?X-rays indicate satisfactory resection head of middle phalanx fourth digit right with good alignment of the digit ?   ? ? ?

## 2022-02-06 LAB — HM MAMMOGRAPHY

## 2022-02-14 ENCOUNTER — Encounter: Payer: Self-pay | Admitting: *Deleted

## 2022-02-15 ENCOUNTER — Encounter: Payer: Self-pay | Admitting: Family Medicine

## 2022-05-03 ENCOUNTER — Encounter: Payer: Self-pay | Admitting: Family Medicine

## 2022-05-05 ENCOUNTER — Encounter: Payer: Self-pay | Admitting: Family Medicine

## 2022-05-05 NOTE — Progress Notes (Unsigned)
Chief Complaint  Patient presents with   Medicare Wellness    Fasting AWV. She did call Dr Carlean Purl and they said 10 years, she is questioning it though.     Rhonda Gardner is a 66 y.o. female who presents for Medicare Annual Wellness visit and follow-up on chronic medical conditions.    H/o duodenal, low-grade follicular lymphoma, diagnosed 10/2018.  She was treated with radiation.  Post-treatment endoscopy and biopsy showed no evidence of residual lymphoma. She last saw Dr. Merleen Nicely in follow up in 09/2021. She had mentioned at that time ongoing issues since skull fracture (02/2019)--reported feeling like she has cotton in her head, aphasia and fatigue, getting a lot worse over time; there is ataxia and fear of falling.  She was offered neurology referral (through [facility]), declined.  At her AWV last year (04/2021), she reported that her smell and taste were improving (not back to normal; could not longer have chocolate or coffee, tasted bad). She states this persists (also peanut butter tastes bad, and some other desserts), but reports ongoing improvement. She had previously seen neurosurgeon, diagnosed with post-concussive syndrome, and completed rehab.  At her visit last year she stated that her balance was much better after rehab, but had recently started noticing some balance problems again, planned to restart her home exercises. She has been doing home exercises, they are harder, and don't seem to be helping. Last MRI was 09/2019, through neurosurgeon. Her husband sent a memo just prior to the appointment, and excerpts are presented here.  His "observations, from the way she appears and what she tells me, include lightheadedness, dizziness, balance, fatigue, and on rare occasion nausea."  "Since the accident, Rhonda Gardner sometimes struggles to complete basic activities such as shopping, entertaining, housekeeping etc. She usually manages to complete the tasks but upon completion she is totally  consumed - lightheaded, dizzy, fatigued and generally must lay down and rest. She has tried to resume her walking regimen with her neighbor friend but had to give it up after a few attempts because she just felt so bad after the walks. She is basically getting no physical activity now for the first time since we met around 35 years ago." "While we hoped that the passage of time would help to reduce the symptoms, it is becoming clear that they are unfortunately getting worse."  She states that the nausea is very rare.  Main issue is her unsteadiness.  She is worse at the end of the day.  She is exhausted after a busy day, sometimes even into the next day or two. She has been walking some in her driveway.  She sometimes notes dizziness. Doesn't feel as bad as when she had chemo, but has similar feeling of fatigue, feeling wiped out (not sleepy, feels "non-functional".)  H/o breast Cancer: She had been under the care of Dr. Jana Hakim, now released to PCP care.  She still sees Dr. Marlou Starks yearly, last seen 08/2021. Last mammogram was 01/2022. Denies any breast concerns.   Osteoporosis:  This had been managed by Dr. Jana Hakim with zoledronate infusions, every 6 months. Per chart, appears to have been started 08/2016, last received 04/2020.  DEXA in 06/2018 showed T-1.8 at spine.  Dr. Jana Hakim had said to hold off on next dose until next DEXA, which was done 01/2021. This showed slight decline in spine, T-2.1 (up from T-1.8 in 2019, had been T-2.5 in 06/2016).  We had forwarded copy to Dr. Jana Hakim, pt reported last year not hearing anything  re: recommendations. He has retired. She takes Calcium tablet twice daily, drinks milk daily, and eats kale most days.  Takes 3000 IU of vitamin D3 currently (sometimes takes 5000 IU, depending what she buys).  Bipolar disorder:  Diagnosed age 30 or 66.  She has been stable on her current regimen of lamictal.  Moods are well controlled, no side effects.  She previously was under  the care of Dr. Letta Moynahan and Gala Murdoch (who retired).  We have been managing these medications, and she continues to do well. She had some issues with certain generic formulations in the past (had myalgias), but has been doing well. She reports her moods are good. Sometimes feels discouraged about her ongoing symptoms since the concussion.  Subclinical hypothyroidism:  TSH was mildly elevated at 5.270 in the past.  Recheck was normal, and TPO was negative. She denies changes in hair/skin/bowels/moods/weight. Fatigue as above (easily wiped out)  Lab Results  Component Value Date   TSH 3.840 04/30/2021     Immunization History  Administered Date(s) Administered   DTaP 10/14/1954, 10/15/1955, 10/15/1959   Fluad Quad(high Dose 65+) 07/08/2019, 07/10/2020   Gamma Globulin 08/23/1988   Hepatitis A 11/02/1996, 09/29/2002   Hepatitis A, Adult 11/02/1996, 09/29/2002   Hepatitis B 11/11/2007   Hepatitis B, adult 11/11/2007   IPV 08/15/1988   Influenza Split 11/11/2007, 07/13/2013   Influenza,inj,Quad PF,6+ Mos 05/30/2016, 07/17/2017, 07/04/2018   Influenza-Unspecified 07/31/2015, 05/30/2016, 07/17/2017   MMR 04/29/2003   Measles 07/25/1988   Meningococcal Polysaccharide 07/08/1988   PFIZER(Purple Top)SARS-COV-2 Vaccination 11/06/2019, 11/27/2019, 07/10/2020   Pneumococcal Conjugate-13 04/08/2016   Pneumococcal Polysaccharide-23 04/27/2020   Rubella 07/25/1988   Td 10/15/1987, 11/02/1996, 01/23/1998   Tdap 11/11/2007, 01/20/2013   Typhoid Live 11/11/2007   Typhoid Parenteral 07/25/1988, 08/23/1988   Unspecified SARS-COV-2 Vaccination 07/10/2020   Zoster Recombinat (Shingrix) 12/04/2017, 02/19/2018   Zoster, Live 12/01/2014   Didn't get a flu shot this past year. Last Pap smear:04/2019--normal, no high risk HPV detected Last mammogram: 01/2022 Last colonoscopy:06/2016--normal, internal hemorrhoids. 5 year f/u recommended. Last DEXA: 01/2021 T-2.1 spine (06/2018 T-1.8 at spine,  improved from 06/2016 T-2.5) Dentist: every 6 months Ophtho: overdue, has been a few years. Exercise:  Walks up and down the driveway (had a period with no walking), 20 minutes 3x/week. (Last year she reported walking 5x/week with a neighbor, x 1 hr, unable to do this now. Also used to walk with weights 2x/week.) Vitamin D-OH 48.8 in 04/2020 Lipids: Lab Results  Component Value Date   CHOL 212 (H) 04/27/2020   HDL 106 04/27/2020   LDLCALC 89 04/27/2020   TRIG 99 04/27/2020   CHOLHDL 2.0 04/27/2020   Patient Care Team: Rita Ohara, MD as PCP - General (Family Medicine) Magrinat, Virgie Dad, MD (Inactive) as Consulting Physician (Oncology) Mauro Kaufmann, RN as Registered Nurse Rockwell Germany, RN as Registered Nurse Jovita Kussmaul, MD as Consulting Physician (General Surgery) Festus Aloe, MD as Referring Physician (Hematology and Oncology) Berneice Gandy, MD as Physician Assistant (Hematology and Oncology)  Dr. Rhys Martini at [facility] (did EGD) Dr. Thea Gist (saw for loss of taste after fall); has also seen Dr. Janace Hoard in the past (for cerumen impaction) Dr. Carlos American Dr. Patel--PMR (no longer sees) Dr. Fuller Plan, Dr. Delrae Rend at [facility]  Dr. Regal--podiatrist Dr. Tommi Rumps Dr. Satira Sark, Dr. Shelia Media Dr. Warren Lacy Jordan--derm  Depression Screening:     05/06/2022    9:52 AM 04/30/2021    9:48 AM 05/16/2020   10:43 AM 04/27/2020  8:44 AM 04/04/2020    9:59 AM  Depression screen PHQ 2/9  Decreased Interest 0 0 0 0 0  Down, Depressed, Hopeless 1 0 0 0 0  PHQ - 2 Score 1 0 0 0 0  Altered sleeping 0    0  Tired, decreased energy 1    0  Change in appetite 2    0  Feeling bad or failure about yourself  0    0  Trouble concentrating 1    0  Moving slowly or fidgety/restless 0    0  Suicidal thoughts 0    0  PHQ-9 Score 5    0  Difficult doing work/chores Somewhat difficult          05/06/2022    9:54 AM  GAD 7 : Generalized Anxiety Score   Nervous, Anxious, on Edge 0  Control/stop worrying 0  Worry too much - different things 1  Trouble relaxing 0  Restless 0  Easily annoyed or irritable 1  Afraid - awful might happen 0  Total GAD 7 Score 2  Anxiety Difficulty Not difficult at all    Falls screen:     05/06/2022    9:50 AM 04/30/2021    9:48 AM 05/16/2020   10:42 AM 04/27/2020    8:59 AM 04/27/2020    8:44 AM  Fall Risk   Falls in the past year? 0 0 0 1 1  Number falls in past yr: 0 0  0 0  Comment    1 fall, slipped in the shower, no injury   Injury with Fall? 0 0  0 1  Risk for fall due to : No Fall Risks No Fall Risks     Follow up Falls evaluation completed Falls evaluation completed       Functional Status Survey: Is the patient deaf or have difficulty hearing?: Yes (not hearing as well as she used to) Does the patient have difficulty seeing, even when wearing glasses/contacts?: Yes (some blurred and double vision with a lot of reading) Does the patient have difficulty concentrating, remembering, or making decisions?: No Does the patient have difficulty walking or climbing stairs?: Yes (balance issues) Does the patient have difficulty dressing or bathing?: No Does the patient have difficulty doing errands alone such as visiting a doctor's office or shopping?: No  Mini-Cog Scoring: 5      05/06/2022   10:01 AM  MMSE - Mini Mental State Exam  Orientation to time 5  Orientation to Place 5  Registration 3  Attention/ Calculation 5  Recall 3  Language- name 2 objects 2  Language- repeat 1  Language- follow 3 step command 3  Language- read & follow direction 1  Write a sentence 1  Copy design 1  Total score 30    End of Life Discussion:  Patient has a living will and medical power of attorney, in chart.   PMH, PSH, SH and FH were reviewed and updated  Outpatient Encounter Medications as of 05/06/2022  Medication Sig Note   Alpha-Lipoic Acid 600 MG TABS Take by mouth.    Calcium-Magnesium (CALCIUM  MAGNESIUM 750) 300-300 MG TABS Take by mouth. Bid    carboxymethylcellulose (REFRESH PLUS) 0.5 % SOLN Place 1 drop into both eyes 3 (three) times daily as needed (dry eyes.).    Cholecalciferol (VITAMIN D-3) 125 MCG (5000 UT) TABS Take 5,000 Units by mouth daily. 04/14/2019: 3000-5000 daily   lamoTRIgine (LAMICTAL) 200 MG tablet Take 1/2 (one-half) tablet  by mouth once daily    Lutein-Zeaxanthin 25-5 MG CAPS Take 1 capsule by mouth daily.    ibuprofen (ADVIL) 200 MG tablet Take 200 mg by mouth every 6 (six) hours as needed. (Patient not taking: Reported on 04/30/2021)    No facility-administered encounter medications on file as of 05/06/2022.   Takes Flonase daily.  Allergies  Allergen Reactions   Clindamycin/Lincomycin Diarrhea    Resulted in C-diff   Other Nausea Only    Stronger pain meds   Adhesive [Tape] Rash    Pt is sensitive to any kind of adhesive tape products.   ROS:  The patient denies anorexia, fever, headaches,  decreased hearing (not as good as when younger, no recent change), ear pain, sore throat, breast concerns, chest pain, palpitations, syncope, dyspnea on exertion, cough, swelling, nausea, vomiting, diarrhea, constipation, abdominal pain, melena, hematochezia, indigestion/heartburn, hematuria, incontinence, dysuria, vaginal bleeding, discharge, odor or itch, genital lesions, joint pains, numbness, tingling, weakness, tremor, suspicious skin lesions, depression, anxiety, abnormal bleeding/bruising, or enlarged lymph nodes. Vaginal dryness with intercourse, controlled with lubricants Smell and taste still improving. Nocturia 3-4x (related to fluids at night), unchanged. Blurred and double vision when reading a lot (has known astygmatism). Fatigue and dizziness per HPI.  Feels like equilibrium is off.  "Head feels clogged". Denies vertigo or lightheadedness.   PHYSICAL EXAM:  BP 120/80   Pulse 72   Ht '5\' 6"'  (1.676 m)   Wt 136 lb (61.7 kg)   BMI 21.95 kg/m   Wt  Readings from Last 3 Encounters:  05/06/22 136 lb (61.7 kg)  04/30/21 135 lb 9.6 oz (61.5 kg)  05/16/20 131 lb (59.4 kg)    General Appearance:     Alert, cooperative, no distress, appears stated age, in good spirits.  Head:     Normocephalic, without obvious abnormality, atraumatic   Eyes:     PERRL, conjunctiva/corneas clear, EOM's intact, fundi  benign   Ears:     Normal TM's and external ear canals.   Nose:    Mod edema of turbinates bilaterally, some clear mucus noted; no sinus tenderness  Throat:    Normal mucosa  Neck:    Supple, no lymphadenopathy;  thyroid:  no enlargement/ tenderness/nodules; no carotid bruit or JVD   Back:     Spine nontender, no curvature, ROM normal, no CVA tenderness   Lungs:      Clear to auscultation bilaterally without wheezes, rales or ronchi; respirations unlabored   Chest Wall:     No tenderness or deformity. WHSS R upper chest.   Heart:     Regular rate and rhythm, S1 and S2 normal, no murmur, rub   or gallop   Breast Exam:     No tenderness, nipple discharge or inversion. No axillary lymphadenopathy. WHSS at left lateral breast. No masses.  Abdomen:      Soft, non-tender, nondistended, normoactive bowel sounds, no masses, no hepatosplenomegaly   Genitalia:     Normal external genitalia without lesions, mild atrophic changes.  BUS and vagina normal; no cervical motion tenderness. No abnormal vaginal discharge. Pap not obtained. Uterus and adnexa not enlarged, nontender, no masses.  Rectal:     Normal tone, no masses or tenderness; heme negative stool  Extremities:    No clubbing, cyanosis or edema   Pulses:    2+ and symmetric all extremities   Skin:    Skin color, texture, turgor normal, no rashes or lesions.  Lymph nodes:    Cervical, supraclavicular,  inguinal and axillary nodes normal   Neurologic:    Normal strength, sensation and gait; reflexes 2+ and symmetric throughout. Cranial nerves grossly intact.  Normal finger to nose, rapid alternating  movement.  Limited gait assessment is normal.                Psych:   Normal mood, affect, hygiene and grooming   ASSESSMENT/PLAN:  Medicare annual wellness visit, subsequent  Bipolar disorder in full remission, most recent episode unspecified type ([place]) - cont lamictal  Other osteoporosis without current pathological fracture - Discussed Ca, D, weight-bearing exercise. Repeat DEXA 01/2023 [facility]) - Plan: VITAMIN D 25 Hydroxy (Vit-D Deficiency, Fractures)  History of breast cancer - cont yearly mammo's in April.  Has been seeing Dr. Marlou Starks yearly. Normal exam  History of lymphoma - no e/o recurrence, sees Dr. Merleen Nicely yearly in December  Cognitive deficits - some mild aphasia (not noticeable in visit, reports sx worse at end of day). No e/o dementia--normal MMSE and mini-Cog today - Plan: TSH, Vitamin B12, Ambulatory Referral to Neuro Rehab  History of skull fracture - 02/2019; ongoing issues with balance, dizziness, fatigue. Will refer back to neuro and to neuro-rehab - Plan: Ambulatory Referral to Neuro Rehab  Medication monitoring encounter - Plan: Comprehensive metabolic panel  Fatigue, unspecified type - Energy fluctuates; if busy, can feel wiped out for extended periods.  Recheck labs to look for underlying cause - Plan: Comprehensive metabolic panel, CBC with Differential/Platelet, VITAMIN D 25 Hydroxy (Vit-D Deficiency, Fractures), TSH, Vitamin B12  Balance problem - since skull fracture 02/2019. Prev benefited from neuro-rehab.  Refer back - Plan: Ambulatory Referral to Neuro Rehab  Head congestion - on exam, there is e/o allergies; encouraged her to increase flonase to 2 sprays/d, and reminded to take gentle sniffs  Refer to neurology after labs back.   Discussed monthly self breast exams and yearly mammograms; at least 30 minutes of aerobic activity at least 5 days/week, weight-bearing exercise at least 2x/week; proper sunscreen use reviewed; healthy diet, including goals of  calcium and vitamin D intake and alcohol recommendations (less than or equal to 1 drink/day) reviewed; regular seatbelt use; changing batteries in smoke detectors.  Immunization recommendations discussed--continue yearly flu shots. Updated bivalent COVID booster is recommended when available in the Fall.  Consider new RSV vaccine.  Colonoscopy recommendations reviewed-- UPDATE--prev supposed to be due 06/2021, changed to 2027. DEXA 01/2023  MOST form reviewed and completed, Full Code, Full Care.    Medicare Attestation I have personally reviewed: [name] medical and social history Their use of alcohol, tobacco or illicit drugs Their current medications and supplements [name] functional ability including ADLs,fall risks, home safety risks, cognitive, and hearing and visual impairment Diet and physical activities Evidence for depression or mood disorders  [name] weight, height, BMI have been recorded in the chart.  I have made referrals, counseling, and provided education to the patient based on review of the above and I have provided the patient with a written personalized care plan for preventive services.

## 2022-05-05 NOTE — Patient Instructions (Addendum)
HEALTH MAINTENANCE RECOMMENDATIONS:  It is recommended that you get at least 30 minutes of aerobic exercise at least 5 days/week (for weight loss, you may need as much as 60-90 minutes). This can be any activity that gets your heart rate up. This can be divided in 10-15 minute intervals if needed, but try and build up your endurance at least once a week.  Weight bearing exercise is also recommended twice weekly.  Eat a healthy diet with lots of vegetables, fruits and fiber.  "Colorful" foods have a lot of vitamins (ie green vegetables, tomatoes, red peppers, etc).  Limit sweet tea, regular sodas and alcoholic beverages, all of which has a lot of calories and sugar.  Up to 1 alcoholic drink daily may be beneficial for women (unless trying to lose weight, watch sugars).  Drink a lot of water.  Calcium recommendations are 1200-1500 mg daily (1500 mg for postmenopausal women or women without ovaries), and vitamin D 1000 IU daily.  This should be obtained from diet and/or supplements (vitamins), and calcium should not be taken all at once, but in divided doses.  Monthly self breast exams and yearly mammograms for women over the age of 43 is recommended.  Sunscreen of at least SPF 30 should be used on all sun-exposed parts of the skin when outside between the hours of 10 am and 4 pm (not just when at beach or pool, but even with exercise, golf, tennis, and yard work!)  Use a sunscreen that says "broad spectrum" so it covers both UVA and UVB rays, and make sure to reapply every 1-2 hours.  Remember to change the batteries in your smoke detectors when changing your clock times in the spring and fall. Carbon monoxide detectors are recommended for your home.  Use your seat belt every time you are in a car, and please drive safely and not be distracted with cell phones and texting while driving.   Rhonda Gardner , Thank you for taking time to come for your Medicare Wellness Visit. I appreciate your ongoing  commitment to your health goals. Please review the following plan we discussed and let me know if I can assist you in the future.   This is a list of the screening recommended for you and due dates:  Health Maintenance  Topic Date Due   COVID-19 Vaccine (5 - Booster for Pfizer series) 09/04/2020   Flu Shot  05/14/2022   Tetanus Vaccine  01/21/2023   Mammogram  02/07/2023   Colon Cancer Screening  07/08/2026   Pneumonia Vaccine  Completed   DEXA scan (bone density measurement)  Completed   Hepatitis C Screening: USPSTF Recommendation to screen - Ages 27-79 yo.  Completed   Zoster (Shingles) Vaccine  Completed   HPV Vaccine  Aged Out   Consider new RSV vaccine when available. Please get high dose flu shots every year.  Updated bivalent COVID booster is recommended, when it becomes available in the Fall.  You are due for another bone density test in 01/2023. You can schedule this along with your mammogram ([facility] should send me the other to fax when you have schedule).  Please schedule a routine eye exam.  Try and get 150 minutes of aerobic exercise weekly (trying to get heart rate to 90-100, doesn't need to be more vigorous).   Try and use weights 2x/week.  We are referring you to neurology for evaluation of your ongoing balance issues and other concerns since the skull fracture.  We are also  referring you back to neuro-rehab (physical therapy)--I suspect therapy will start sooner than an appointment with the neurologist.  Increase your Flonase to 2 sprays into each nostril, once daily. Be sure to take gentle sniffs.

## 2022-05-06 ENCOUNTER — Encounter: Payer: Self-pay | Admitting: Family Medicine

## 2022-05-06 ENCOUNTER — Ambulatory Visit (INDEPENDENT_AMBULATORY_CARE_PROVIDER_SITE_OTHER): Payer: Medicare Other | Admitting: Family Medicine

## 2022-05-06 VITALS — BP 120/80 | HR 72 | Ht 66.0 in | Wt 136.0 lb

## 2022-05-06 DIAGNOSIS — R2689 Other abnormalities of gait and mobility: Secondary | ICD-10-CM

## 2022-05-06 DIAGNOSIS — R5383 Other fatigue: Secondary | ICD-10-CM

## 2022-05-06 DIAGNOSIS — R4189 Other symptoms and signs involving cognitive functions and awareness: Secondary | ICD-10-CM

## 2022-05-06 DIAGNOSIS — Z8781 Personal history of (healed) traumatic fracture: Secondary | ICD-10-CM

## 2022-05-06 DIAGNOSIS — M818 Other osteoporosis without current pathological fracture: Secondary | ICD-10-CM | POA: Diagnosis not present

## 2022-05-06 DIAGNOSIS — Z0000 Encounter for general adult medical examination without abnormal findings: Secondary | ICD-10-CM | POA: Diagnosis not present

## 2022-05-06 DIAGNOSIS — R0981 Nasal congestion: Secondary | ICD-10-CM

## 2022-05-06 DIAGNOSIS — Z5181 Encounter for therapeutic drug level monitoring: Secondary | ICD-10-CM

## 2022-05-06 DIAGNOSIS — Z8572 Personal history of non-Hodgkin lymphomas: Secondary | ICD-10-CM | POA: Diagnosis not present

## 2022-05-06 DIAGNOSIS — F3170 Bipolar disorder, currently in remission, most recent episode unspecified: Secondary | ICD-10-CM

## 2022-05-06 DIAGNOSIS — Z853 Personal history of malignant neoplasm of breast: Secondary | ICD-10-CM

## 2022-05-07 ENCOUNTER — Telehealth: Payer: Self-pay | Admitting: Family Medicine

## 2022-05-07 ENCOUNTER — Other Ambulatory Visit: Payer: Self-pay

## 2022-05-07 ENCOUNTER — Encounter: Payer: Self-pay | Admitting: Hematology

## 2022-05-07 LAB — COMPREHENSIVE METABOLIC PANEL
ALT: 15 IU/L (ref 0–32)
AST: 17 IU/L (ref 0–40)
Albumin/Globulin Ratio: 2.5 — ABNORMAL HIGH (ref 1.2–2.2)
Albumin: 5 g/dL — ABNORMAL HIGH (ref 3.9–4.9)
Alkaline Phosphatase: 61 IU/L (ref 44–121)
BUN/Creatinine Ratio: 10 — ABNORMAL LOW (ref 12–28)
BUN: 10 mg/dL (ref 8–27)
Bilirubin Total: 0.4 mg/dL (ref 0.0–1.2)
CO2: 25 mmol/L (ref 20–29)
Calcium: 10.1 mg/dL (ref 8.7–10.3)
Chloride: 103 mmol/L (ref 96–106)
Creatinine, Ser: 0.97 mg/dL (ref 0.57–1.00)
Globulin, Total: 2 g/dL (ref 1.5–4.5)
Glucose: 93 mg/dL (ref 70–99)
Potassium: 4.2 mmol/L (ref 3.5–5.2)
Sodium: 142 mmol/L (ref 134–144)
Total Protein: 7 g/dL (ref 6.0–8.5)
eGFR: 64 mL/min/{1.73_m2} (ref >59)

## 2022-05-07 LAB — CBC WITH DIFFERENTIAL/PLATELET
Basophils Absolute: 0.1 10*3/uL (ref 0.0–0.2)
Basos: 1 %
EOS (ABSOLUTE): 0.2 10*3/uL (ref 0.0–0.4)
Eos: 3 %
Hematocrit: 42.4 % (ref 34.0–46.6)
Hemoglobin: 14 g/dL (ref 11.1–15.9)
Immature Grans (Abs): 0 10*3/uL (ref 0.0–0.1)
Immature Granulocytes: 0 %
Lymphocytes Absolute: 0.8 10*3/uL (ref 0.7–3.1)
Lymphs: 15 %
MCH: 30.7 pg (ref 26.6–33.0)
MCHC: 33 g/dL (ref 31.5–35.7)
MCV: 93 fL (ref 79–97)
Monocytes Absolute: 0.4 10*3/uL (ref 0.1–0.9)
Monocytes: 8 %
Neutrophils Absolute: 3.9 10*3/uL (ref 1.4–7.0)
Neutrophils: 73 %
Platelets: 205 10*3/uL (ref 150–450)
RBC: 4.56 x10E6/uL (ref 3.77–5.28)
RDW: 12.3 % (ref 11.7–15.4)
WBC: 5.4 10*3/uL (ref 3.4–10.8)

## 2022-05-07 LAB — TSH: TSH: 4.21 u[IU]/mL (ref 0.450–4.500)

## 2022-05-07 LAB — VITAMIN D 25 HYDROXY (VIT D DEFICIENCY, FRACTURES): Vit D, 25-Hydroxy: 34.2 ng/mL (ref 30.0–100.0)

## 2022-05-07 LAB — VITAMIN B12: Vitamin B-12: 367 pg/mL (ref 232–1245)

## 2022-05-07 NOTE — Telephone Encounter (Signed)
John called [facility] [phone] and states they are able to treat her for the balance and dizziness issues but to treat her for the cognitive issues such as word finding they need a speech referral.

## 2022-05-08 NOTE — Telephone Encounter (Signed)
Called neuro rehab and let them know.

## 2022-05-08 NOTE — Telephone Encounter (Signed)
I would start with balance and dizziness.  Await neuro eval for speech (I didn't notice anything significant during visit, fine to wait for further discussion/eval by neuro).

## 2022-05-13 ENCOUNTER — Ambulatory Visit: Payer: Medicare Other | Attending: Physical Therapy | Admitting: Physical Therapy

## 2022-05-14 ENCOUNTER — Other Ambulatory Visit: Payer: Self-pay

## 2022-05-15 ENCOUNTER — Other Ambulatory Visit: Payer: Self-pay | Admitting: Family Medicine

## 2022-05-15 DIAGNOSIS — F3170 Bipolar disorder, currently in remission, most recent episode unspecified: Secondary | ICD-10-CM

## 2022-06-03 ENCOUNTER — Encounter: Payer: Self-pay | Admitting: Physical Therapy

## 2022-06-03 ENCOUNTER — Ambulatory Visit: Payer: Medicare Other | Attending: Family Medicine | Admitting: Physical Therapy

## 2022-06-03 DIAGNOSIS — R2681 Unsteadiness on feet: Secondary | ICD-10-CM | POA: Diagnosis present

## 2022-06-03 DIAGNOSIS — R2689 Other abnormalities of gait and mobility: Secondary | ICD-10-CM | POA: Diagnosis present

## 2022-06-03 DIAGNOSIS — R42 Dizziness and giddiness: Secondary | ICD-10-CM | POA: Diagnosis present

## 2022-06-03 NOTE — Therapy (Signed)
OUTPATIENT PHYSICAL THERAPY VESTIBULAR EVALUATION     Patient Name: Rhonda Gardner Cox-Fishman MRN: [id number] DOB:May 10, 1956, 66 y.o., female Today's Date: 06/03/2022  PCP: Rita Ohara, MD  REFERRING PROVIDER: Rita Ohara, MD    PT End of Session - 06/03/22 1448     Visit Number 1    Number of Visits 13   with eval   Date for PT Re-Evaluation 07/29/22   to allow for scheduling conflicts   Authorization Type Medicare    Progress Note Due on Visit 10    PT Start Time 1447    PT Stop Time 1523   evaluation   PT Time Calculation (min) 36 min    Activity Tolerance Patient tolerated treatment well    Behavior During Therapy [facility] for tasks assessed/performed             Past Medical History:  Diagnosis Date   Allergy 2006   chemical cauterization in spring 2006, and 2nd treatment with good results.(Dr.Shoemaker)   Bipolar disorder ([place])    Bleeding ulcer    Blood transfusion without reported diagnosis 07/2017   Bleeding ulcer   Breast cancer ([place]) 12/2014   invasive ductal carcinoma (LEFT)   Breast cancer of upper-outer quadrant of left female breast ([place]) 12/22/2014   Breast hematoma 12/19/2015   had left breast aspiration of 4cm hematoma-path indicates benign hematoma   C. difficile colitis    C. 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)   Osteoporosis 06/20/2016   T-2.5'@spine'$  on Dexa ([place])   Plantar fasciitis    PONV (postoperative nausea and vomiting)    Symptomatic anemia 07/16/2017   Past Surgical History:  Procedure Laterality Date   BIOPSY  07/03/2018   Procedure: BIOPSY;  Surgeon: Gatha Mayer, MD;  Location: [facility];  Service: Endoscopy;;   BIOPSY  09/02/2018   Procedure: BIOPSY;  Surgeon: Gatha Mayer, MD;  Location: [facility];  Service: Endoscopy;;   BIOPSY  10/22/2018   Procedure: BIOPSY;  Surgeon: Milus Banister, MD;  Location: [facility];  Service: Endoscopy;;    BREAST BIOPSY Bilateral    benign and a right stereotatic breast biopsy.   COLONOSCOPY     ESOPHAGOGASTRODUODENOSCOPY N/A 07/16/2017   Procedure: ESOPHAGOGASTRODUODENOSCOPY (EGD);  Surgeon: Ladene Artist, MD;  Location: [facility] ENDOSCOPY;  Service: Endoscopy;  Laterality: N/A;   ESOPHAGOGASTRODUODENOSCOPY N/A 10/22/2018   Procedure: ESOPHAGOGASTRODUODENOSCOPY (EGD);  Surgeon: Milus Banister, MD;  Location: [facility];  Service: Endoscopy;  Laterality: N/A;   ESOPHAGOGASTRODUODENOSCOPY (EGD) WITH PROPOFOL N/A 07/03/2018   Procedure: ESOPHAGOGASTRODUODENOSCOPY (EGD) WITH PROPOFOL;  Surgeon: Gatha Mayer, MD;  Location: [facility];  Service: Endoscopy;  Laterality: N/A;   ESOPHAGOGASTRODUODENOSCOPY (EGD) WITH PROPOFOL N/A 09/02/2018   Procedure: ESOPHAGOGASTRODUODENOSCOPY (EGD) WITH PROPOFOL;  Surgeon: Gatha Mayer, MD;  Location: [facility];  Service: Endoscopy;  Laterality: N/A;   EUS N/A 10/22/2018   Procedure: UPPER ENDOSCOPIC ULTRASOUND (EUS) RADIAL;  Surgeon: Milus Banister, MD;  Location: [facility];  Service: Endoscopy;  Laterality: N/A;   fibroid excision  age 38   prolapsed fibroid removed   FINE NEEDLE ASPIRATION  10/22/2018   Procedure: FINE NEEDLE ASPIRATION;  Surgeon: Milus Banister, MD;  Location: [facility];  Service: Endoscopy;;   FOOT SURGERY Left 2014   Dr. Paulla Dolly   FOOT SURGERY Right 08/2016   Dr. Paulla Dolly --Shortening Osteotomy with Fixation 1st Metatarsal    HAMMER TOE  SURGERY Right 12/04/2021   4th toe; Dr. Paulla Dolly   MOUTH SURGERY  06/2017   CYST    PORT-A-CATH REMOVAL N/A 02/15/2016   Procedure: REMOVAL PORT-A-CATH;  Surgeon: Autumn Messing III, MD;  Location: [place];  Service: General;  Laterality: N/A;   RADIOACTIVE SEED GUIDED PARTIAL MASTECTOMY WITH AXILLARY SENTINEL LYMPH NODE BIOPSY Left 05/25/2015   Procedure: LEFT BREAST LUMPECTOMY WITH RADIOACTIVE SEED AND SENTINEL LYMPH NODE BIOPSY;  Surgeon: Autumn Messing III, MD;  Location: [place];  Service: General;  Laterality: Left;   UPPER GASTROINTESTINAL ENDOSCOPY     Patient Active Problem List   Diagnosis Date Noted   Cognitive deficits 05/16/2020   Visual disturbance 05/16/2020   Sleep disturbance 04/04/2020   Post concussive syndrome 03/02/2020   Dizziness and giddiness 56/31/4970   Follicular lymphoma ([place]) 11/16/2018   Duodenum disorder    Upper gastrointestinal bleed 07/02/2018   Abdominal pain, epigastric 07/01/2018   Elevated lipase 07/01/2018   Elevated LFTs 07/01/2018   Iron deficiency anemia 08/17/2017   C. 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   Allergy to adhesive tape 01/25/2015   Malignant neoplasm of upper-outer quadrant of left breast in female, estrogen receptor negative ([place]) 12/22/2014    ONSET DATE: 05/06/2022   REFERRING DIAG: R41.89 (ICD-10-CM) - Cognitive deficits Z87.81 (ICD-10-CM) - History of skull fracture R26.89 (ICD-10-CM) - Balance problem   THERAPY DIAG:  Dizziness and giddiness  Other abnormalities of gait and mobility  Unsteadiness on feet  Rationale for Evaluation and Treatment Rehabilitation  SUBJECTIVE:   SUBJECTIVE STATEMENT: Pt reports she fell in 2020 and hit her head, had PT afterwards to address balance and dizziness and her symptoms improved. Pt reports for the past year she has had an onset of symptoms again that have been gradually worsening. Pt reports her dizziness gets worse with fatigue and is more noticeable in the evenings. Pt  describes dizziness as feeling like "my head is full of mud". Pt reports when her dizziness gets really bad she is non-functional. Pt also reports double or even triple vision in her L eye when reading for an extended period of time, but may be a side effect of her chemo treatment. Pt also reports increased difficulty with balance when her eyes are closed.  Current dizziness: 8/10  Pt accompanied by: self  PERTINENT HISTORY:  Bipolar disorder, osteoporosis, history of breast cancer, history of lymphoma, history of skull fracture  PAIN:  Are you having pain? No  PRECAUTIONS: Fall  WEIGHT BEARING RESTRICTIONS No  FALLS: Has patient fallen in last 6 months? No  LIVING ENVIRONMENT: Lives with: lives with their spouse Lives in: House/apartment Stairs: Yes: Internal: 18 steps; on left going up then on both sides about halfway up Has following equipment at home: None  PLOF: Independent with gait and Independent with transfers  PATIENT GOALS "be less dizzy and improve my balance" "get my life back"  OBJECTIVE:   DIAGNOSTIC FINDINGS: None relevant  COGNITION: Overall cognitive status: Within functional limits for tasks assessed  SENSATION: Pt reports peripheral neuropathy in both feet due to chemo.   LOWER EXTREMITY MMT:   MMT Right eval Left eval  Hip flexion 5 5  Hip abduction    Hip adduction    Hip internal rotation    Hip external rotation    Knee flexion 5 5  Knee extension 5 5  Ankle dorsiflexion 5 5  Ankle plantarflexion    Ankle inversion    Ankle eversion    (Blank rows = not tested)  BED MOBILITY:  Independent  TRANSFERS: Assistive device utilized: None  Sit to stand: Complete Independence Stand to sit: Complete Independence Chair to chair: Complete Independence Floor:  not assessed   FUNCTIONAL TESTs:  MCTSIB: Condition 1: Avg of 3 trials: 30 sec, Condition 2: Avg of 3 trials: 30 sec, Condition 3: Avg of 3 trials: 30 sec, Condition 4: Avg of 3  trials: 6 sec, and Total Score: 96/120  PATIENT SURVEYS:  DHI 46 points (moderate handicap)    VESTIBULAR ASSESSMENT   GENERAL OBSERVATION: ***    SYMPTOM BEHAVIOR:   Subjective history: See subjective above   Non-Vestibular symptoms: diplopia   Type of dizziness: Diplopia, Imbalance (Disequilibrium), Unsteady with head/body turns, and "heavy feeling like mud"   Frequency: daily, some days for only part of the day (evening), some days it's all day long; severity of episodes varies   Duration: episodes last anywhere from part of the day to all day long   Aggravating factors: Worse with fatigue   Relieving factors:  "not that I have found"   Progression of symptoms: worse   OCULOMOTOR EXAM:   Ocular Alignment: {Ocular Alignment:25262}   Ocular ROM: {RANGE OF MOTION:21649}   Spontaneous Nystagmus: {Spontaneous nystagmus:25263}   Gaze-Induced Nystagmus: {gaze-induced nystagmus:25264}   Smooth Pursuits: {smooth pursuit:25265}   Saccades: {saccades:25266}   Convergence/Divergence: *** cm     PATIENT EDUCATION: Education details: Eval findings, POC Person educated: Patient Education method: Explanation Education comprehension: verbalized understanding   GOALS: Goals reviewed with patient? Yes  SHORT TERM GOALS: Target date: 06/24/2022   Initial HEP Baseline: Goal status: INITIAL  2.  Pt will improve score on DHI by 5 points to demonstrate decreased disability level Baseline: 46 points (8/21) Goal status: INITIAL  3.  Pt will improve score on m-CTSIB by 5 sec to demonstrate improved balance Baseline: 96/120 (8/21) Goal status: INITIAL  4.  Vestibular goal to be set pending further evaluation Baseline:  Goal status: INITIAL   LONG TERM GOALS: Target date: 07/15/2022    Pt will be independent with final HEP for improved strength, balance, transfers and gait.  Baseline:  Goal status: INITIAL  2.  Pt will improve score on DHI by 10 points to demonstrate decreased  disability level Baseline: 46 points Goal status: INITIAL  3.  Pt will improve score on m-CTSIB by 10 points to demonstrate improved balance Baseline: 96/120 (8/21) Goal status: INITIAL  4.  Vestibular goal to be set pending further evaluation Baseline:  Goal status: INITIAL   ASSESSMENT:  CLINICAL IMPRESSION: Patient is a 66 year old female referred to Neuro OPPT for balance impairments due to history of a skull fracture.   Pt's PMH is significant for: Bipolar disorder, osteoporosis, history of breast cancer, history of lymphoma, history of skull fracture The following deficits were present during the exam: increased disability/handicap level based on DHI score of 46 points (moderate handicap), impaired balance based on m-CTSIB score of 96/120, and pt's history of symptoms. Based on  previously stated functional outcome measures and patient survey answers as well as history of falls, pt is an increased risk for falls. Pt would benefit from skilled PT to address these impairments and functional limitations to maximize functional mobility independence.   OBJECTIVE IMPAIRMENTS Abnormal gait, decreased balance, decreased knowledge of condition, decreased mobility, dizziness, impaired perceived functional ability, and impaired vision/preception.   ACTIVITY LIMITATIONS carrying, lifting, bending, squatting, stairs, transfers, bathing, and hygiene/grooming  PARTICIPATION LIMITATIONS: meal prep, cleaning, laundry, medication management, personal finances, interpersonal relationship, driving, shopping, and community activity  PERSONAL FACTORS Past/current experiences, Time since onset of injury/illness/exacerbation, and 1-2 comorbidities:    Bipolar disorder, osteoporosis, history of breast cancer, history of lymphoma, history of skull fractureare also affecting patient's functional outcome.   REHAB POTENTIAL: Good  CLINICAL DECISION MAKING: Stable/uncomplicated  EVALUATION COMPLEXITY:  Moderate   PLAN: PT FREQUENCY: 2x/week  PT DURATION: 6 weeks  PLANNED INTERVENTIONS: Therapeutic exercises, Therapeutic activity, Neuromuscular re-education, Balance training, Gait training, Patient/Family education, Self Care, Joint mobilization, Stair training, Vestibular training, Canalith repositioning, Visual/preceptual remediation/compensation, DME instructions, Cryotherapy, Moist heat, Manual therapy, and Re-evaluation  PLAN FOR NEXT SESSION: further assess vision, VOR, and vestibular function ***   Excell Seltzer, PT, DPT, CSRS 06/03/2022, 3:25 PM

## 2022-06-10 ENCOUNTER — Ambulatory Visit: Payer: Medicare Other | Admitting: Physical Therapy

## 2022-06-10 ENCOUNTER — Encounter: Payer: Self-pay | Admitting: Physical Therapy

## 2022-06-10 ENCOUNTER — Telehealth: Payer: Self-pay | Admitting: Diagnostic Neuroimaging

## 2022-06-10 VITALS — BP 107/70 | HR 80

## 2022-06-10 DIAGNOSIS — R42 Dizziness and giddiness: Secondary | ICD-10-CM

## 2022-06-10 DIAGNOSIS — R2689 Other abnormalities of gait and mobility: Secondary | ICD-10-CM

## 2022-06-10 NOTE — Telephone Encounter (Signed)
LVM and sent mychart msg asking pt to call back to reschedule 9/19 appointment - MD out

## 2022-06-10 NOTE — Patient Instructions (Signed)
  Gaze Stabilization: Sitting    Keeping eyes on target on wall a few feet away, tilt head down 15-30 and move head side to side for ___30_ seconds. Repeat while moving head up and down for __30__ seconds.  Perform 2-3 times each.  Do __2__ sessions per day.   Gaze Stabilization: Tip Card  1.Target must remain in focus, not blurry, and appear stationary while head is in motion. 2.Perform exercises with small head movements (45 to either side of midline). 3.Increase speed of head motion so long as target is in focus. 4.If you wear eyeglasses, be sure you can see target through lens (therapist will give specific instructions for bifocal / progressive lenses). 5.These exercises may provoke dizziness or nausea. Work through these symptoms. If too dizzy, slow head movement slightly. Rest between each exercise. 6.Exercises demand concentration; avoid distractions. 7.For safety, perform standing exercises close to a counter, wall, corner, or next to someone.

## 2022-06-10 NOTE — Therapy (Addendum)
OUTPATIENT PHYSICAL THERAPY VESTIBULAR TREATMENT     Patient Name: Rhonda Gardner MRN: [id number] DOB:May 13, 1956, 66 y.o., female Today's Date: 06/10/2022  PCP: Rita Ohara, MD  REFERRING PROVIDER: Rita Ohara, MD    PT End of Session - 06/10/22 1452     Visit Number 2    Number of Visits 13   with eval   Date for PT Re-Evaluation 07/29/22   to allow for scheduling conflicts   Authorization Type Medicare    Progress Note Due on Visit 10    PT Start Time 1451   pt arrived late   PT Stop Time 1530    PT Time Calculation (min) 39 min    Activity Tolerance Patient tolerated treatment well    Behavior During Therapy [facility] for tasks assessed/performed             Past Medical History:  Diagnosis Date   Allergy 2006   chemical cauterization in spring 2006, and 2nd treatment with good results.(Dr.Shoemaker)   Bipolar disorder ([place])    Bleeding ulcer    Blood transfusion without reported diagnosis 07/2017   Bleeding ulcer   Breast cancer ([place]) 12/2014   invasive ductal carcinoma (LEFT)   Breast cancer of upper-outer quadrant of left female breast ([place]) 12/22/2014   Breast hematoma 12/19/2015   had left breast aspiration of 4cm hematoma-path indicates benign hematoma   C. difficile colitis    C. 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)   Osteoporosis 06/20/2016   T-2.5'@spine'$  on Dexa ([place])   Plantar fasciitis    PONV (postoperative nausea and vomiting)    Symptomatic anemia 07/16/2017   Past Surgical History:  Procedure Laterality Date   BIOPSY  07/03/2018   Procedure: BIOPSY;  Surgeon: Gatha Mayer, MD;  Location: [facility];  Service: Endoscopy;;   BIOPSY  09/02/2018   Procedure: BIOPSY;  Surgeon: Gatha Mayer, MD;  Location: [facility];  Service: Endoscopy;;   BIOPSY  10/22/2018   Procedure: BIOPSY;  Surgeon: Milus Banister, MD;  Location: [facility];  Service: Endoscopy;;    BREAST BIOPSY Bilateral    benign and a right stereotatic breast biopsy.   COLONOSCOPY     ESOPHAGOGASTRODUODENOSCOPY N/A 07/16/2017   Procedure: ESOPHAGOGASTRODUODENOSCOPY (EGD);  Surgeon: Ladene Artist, MD;  Location: [facility] ENDOSCOPY;  Service: Endoscopy;  Laterality: N/A;   ESOPHAGOGASTRODUODENOSCOPY N/A 10/22/2018   Procedure: ESOPHAGOGASTRODUODENOSCOPY (EGD);  Surgeon: Milus Banister, MD;  Location: [facility];  Service: Endoscopy;  Laterality: N/A;   ESOPHAGOGASTRODUODENOSCOPY (EGD) WITH PROPOFOL N/A 07/03/2018   Procedure: ESOPHAGOGASTRODUODENOSCOPY (EGD) WITH PROPOFOL;  Surgeon: Gatha Mayer, MD;  Location: [facility];  Service: Endoscopy;  Laterality: N/A;   ESOPHAGOGASTRODUODENOSCOPY (EGD) WITH PROPOFOL N/A 09/02/2018   Procedure: ESOPHAGOGASTRODUODENOSCOPY (EGD) WITH PROPOFOL;  Surgeon: Gatha Mayer, MD;  Location: [facility];  Service: Endoscopy;  Laterality: N/A;   EUS N/A 10/22/2018   Procedure: UPPER ENDOSCOPIC ULTRASOUND (EUS) RADIAL;  Surgeon: Milus Banister, MD;  Location: [facility];  Service: Endoscopy;  Laterality: N/A;   fibroid excision  age 80   prolapsed fibroid removed   FINE NEEDLE ASPIRATION  10/22/2018   Procedure: FINE NEEDLE ASPIRATION;  Surgeon: Milus Banister, MD;  Location: [facility];  Service: Endoscopy;;   FOOT SURGERY Left 2014   Dr. Paulla Dolly   FOOT SURGERY Right 08/2016   Dr. Paulla Dolly --Shortening Osteotomy with Fixation 1st Metatarsal  HAMMER TOE SURGERY Right 12/04/2021   4th toe; Dr. Paulla Dolly   MOUTH SURGERY  06/2017   [facility]    PORT-A-CATH REMOVAL N/A 02/15/2016   Procedure: REMOVAL PORT-A-CATH;  Surgeon: Autumn Messing III, MD;  Location: [facility];  Service: General;  Laterality: N/A;   RADIOACTIVE SEED GUIDED PARTIAL MASTECTOMY WITH AXILLARY SENTINEL LYMPH NODE BIOPSY Left 05/25/2015   Procedure: LEFT BREAST LUMPECTOMY WITH RADIOACTIVE SEED AND SENTINEL LYMPH NODE BIOPSY;  Surgeon: Autumn Messing III, MD;  Location: [facility];  Service: General;  Laterality: Left;   UPPER GASTROINTESTINAL ENDOSCOPY     Patient Active Problem List   Diagnosis Date Noted   Cognitive deficits 05/16/2020   Visual disturbance 05/16/2020   Sleep disturbance 04/04/2020   Post concussive syndrome 03/02/2020   Dizziness and giddiness 34/74/2595   Follicular lymphoma ([place]) 11/16/2018   Duodenum disorder    Upper gastrointestinal bleed 07/02/2018   Abdominal pain, epigastric 07/01/2018   Elevated lipase 07/01/2018   Elevated LFTs 07/01/2018   Iron deficiency anemia 08/17/2017   C. 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 Has more dizziness and balance is worse. Also having more fatigue. Has had a lot of near falls. Sometimes reports some dizziness getting in and out of the bed.   Current dizziness: 8/10  Pt accompanied by: self  PERTINENT HISTORY:  Bipolar disorder,  osteoporosis, history of breast cancer, history of lymphoma, history of skull fracture  PAIN:  Are you having pain? No  Vitals:   06/10/22 1508 06/10/22 1514  BP: 110/71 107/70  Pulse: 75 80   Sitting and standing.   PRECAUTIONS: Fall   PATIENT GOALS "be less dizzy and improve my balance" "get my life back"   OBJECTIVE:   VESTIBULAR ASSESSMENT   VESTIBULAR - OCULAR REFLEX:    Slow VOR: Normal, mild dizziness    VOR Cancellation: Normal, mild dizziness    Head-Impulse Test: HIT Right: negative HIT Left: positive, reports 4/10 dizziness.     Dynamic Visual Acuity: Static: Line 9 Dynamic: Line 4 5 line difference, mild dizziness.     POSITIONAL TESTING: Right Dix-Hallpike: no nystagmus Left Dix-Hallpike: no nystagmus Right Roll Test: no nystagmus Left Roll Test: no nystagmus Right Sidelying: no nystagmus and reports feeling dizzy and thick headed. Left Sidelying: no nystagmus and reports feeling dizzy and thick headed    MOTION SENSITIVITY:    Motion Sensitivity Quotient  Intensity: 0 = none, 1 = Lightheaded, 2 = Mild, 3 =  Moderate, 4 = Severe, 5 = Vomiting  Intensity  1. Sitting to supine   2. Supine to L side 0  3. Supine to R side 0  4. Supine to sitting 3  5. L Hallpike-Dix 2  6. Up from L  2  7. R Hallpike-Dix 2  8. Up from R  2  9. Sitting, head  tipped to L knee   10. Head up from L  knee   11. Sitting, head  tipped to R knee   12. Head up from R  knee   13. Sitting head turns x5 2  14.Sitting head nods x5 2  15. In stance, 180  turn to L  2  16. In stance, 180  turn to R 2     VESTIBULAR TREATMENT:   Gaze Adaptation:   x1 Viewing Horizontal: Position: Seated, Time: 30 seconds x 2 reps, Reps: 2, and Comment: Mild sx and x1 Viewing Vertical:  Position: Seated, Time: 30 seconds x2 reps, Reps: 2, and Comment: Mild/mod sx, more symptomatic than side to side. Cued for slowed pace. Added to HEP   Cues for technique.       PATIENT  EDUCATION: Education details: Further vestibular assessment findings, initial HEP for seated VOR x1.  Person educated: Patient Education method: Explanation, Demonstration, Verbal cues, and Handouts Education comprehension: verbalized understanding and returned demonstration  HEP:  Seated VOR x1 in vertical/horizontal directions x30 seconds    GOALS: Goals reviewed with patient? Yes  SHORT TERM GOALS: Target date: 06/24/2022   Initial HEP Baseline: Goal status: INITIAL  2.  Pt will improve score on DHI by 5 points to demonstrate decreased disability level Baseline: 46 points (8/21) Goal status: INITIAL  3.  Pt will improve score on m-CTSIB by 5 sec to demonstrate improved balance Baseline: 96/120 (8/21) Goal status: INITIAL  4.  Pt will undergo further assessment of FGA with LTG written. Baseline:  Goal status: INITIAL   LONG TERM GOALS: Target date: 07/15/2022    Pt will be independent with final HEP for improved strength, balance, transfers and gait.  Baseline:  Goal status: INITIAL  2.  Pt will improve score on DHI by 10 points to demonstrate decreased disability level Baseline: 46 points Goal status: INITIAL  3.  Pt will improve score on m-CTSIB by 10 points to demonstrate improved balance Baseline: 96/120 (8/21) Goal status: INITIAL  4.  Pt will improve DVA to a 3 line or less difference in order to demo improved VOR.  Baseline: 5 line difference  Goal status: INITIAL  5.  Pt will perform MSQ items and rate 0-1/5 dizziness in order to demo improved motion sensitivity.  Baseline: 2-3 dizziness  Goal status: INITIAL  6.  FGA goal to be written.  Baseline:  Goal status: INITIAL  ASSESSMENT:  CLINICAL IMPRESSION: Performed further vestibular assessment today with pt demonstrating a positive HIT test to the L and had a 5 line difference on the DVA indicating impaired VOR and vestibular hypofunction. Pt demonstrating mild-mod dizziness on items on MSQ,  indicating motion sensitivities with movements. Pt reporting feeling dizzy and heavy headed in positional testing, but no nystagmus was noted. Assessed BP in sitting and standing and pt was not orthostatic and BP remained stable. LTGs updated as appropriate after further vestibular assessment. Initiated VOR x1 in sitting with pt having mild sx. Will continue to progress towards LTGs.    OBJECTIVE IMPAIRMENTS Abnormal gait, decreased balance, decreased knowledge of condition, decreased mobility, dizziness,  impaired perceived functional ability, and impaired vision/preception.   ACTIVITY LIMITATIONS carrying, lifting, bending, squatting, stairs, transfers, bathing, and hygiene/grooming  PARTICIPATION LIMITATIONS: meal prep, cleaning, laundry, medication management, personal finances, interpersonal relationship, driving, shopping, and community activity  PERSONAL FACTORS Past/current experiences, Time since onset of injury/illness/exacerbation, and 1-2 comorbidities:    Bipolar disorder, osteoporosis, history of breast cancer, history of lymphoma, history of skull fractureare also affecting patient's functional outcome.   REHAB POTENTIAL: Good  CLINICAL DECISION MAKING: Stable/uncomplicated  EVALUATION COMPLEXITY: Moderate   PLAN: PT FREQUENCY: 2x/week  PT DURATION: 6 weeks  PLANNED INTERVENTIONS: Therapeutic exercises, Therapeutic activity, Neuromuscular re-education, Balance training, Gait training, Patient/Family education, Self Care, Joint mobilization, Stair training, Vestibular training, Canalith repositioning, Visual/preceptual remediation/compensation, DME instructions, Cryotherapy, Moist heat, Manual therapy, and Re-evaluation  PLAN FOR NEXT SESSION: perform FGA and write goal. Add to HEP for balance/vestibular deficits and motion sensitivities    Arliss Journey, PT, DPT 06/10/2022, 3:30 PM

## 2022-06-12 ENCOUNTER — Ambulatory Visit: Payer: Medicare Other | Admitting: Physical Therapy

## 2022-06-12 ENCOUNTER — Encounter: Payer: Self-pay | Admitting: Physical Therapy

## 2022-06-12 DIAGNOSIS — R42 Dizziness and giddiness: Secondary | ICD-10-CM | POA: Diagnosis not present

## 2022-06-12 DIAGNOSIS — R2681 Unsteadiness on feet: Secondary | ICD-10-CM

## 2022-06-12 DIAGNOSIS — R2689 Other abnormalities of gait and mobility: Secondary | ICD-10-CM

## 2022-06-12 NOTE — Therapy (Signed)
OUTPATIENT PHYSICAL THERAPY VESTIBULAR TREATMENT     Patient Name: Rhonda Gardner MRN: [id number] DOB:12-Nov-1955, 66 y.o., female Today's Date: 06/12/2022  PCP: Rita Ohara, MD  REFERRING PROVIDER: Rita Ohara, MD    PT End of Session - 06/12/22 0935     Visit Number 3    Number of Visits 13   with eval   Date for PT Re-Evaluation 07/29/22   to allow for scheduling conflicts   Authorization Type Medicare    Progress Note Due on Visit 10    PT Start Time 0933    PT Stop Time 1014    PT Time Calculation (min) 41 min    Equipment Utilized During Treatment Gait belt    Activity Tolerance Patient tolerated treatment well    Behavior During Therapy [facility] for tasks assessed/performed             Past Medical History:  Diagnosis Date   Allergy 2006   chemical cauterization in spring 2006, and 2nd treatment with good results.(Dr.Shoemaker)   Bipolar disorder ([place])    Bleeding ulcer    Blood transfusion without reported diagnosis 07/2017   Bleeding ulcer   Breast cancer ([place]) 12/2014   invasive ductal carcinoma (LEFT)   Breast cancer of upper-outer quadrant of left female breast ([place]) 12/22/2014   Breast hematoma 12/19/2015   had left breast aspiration of 4cm hematoma-path indicates benign hematoma   C. difficile colitis    C. 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)   Osteoporosis 06/20/2016   T-2.5'@spine'  on Dexa ([place])   Plantar fasciitis    PONV (postoperative nausea and vomiting)    Symptomatic anemia 07/16/2017   Past Surgical History:  Procedure Laterality Date   BIOPSY  07/03/2018   Procedure: BIOPSY;  Surgeon: Gatha Mayer, MD;  [facility]: [facility];  Service: Endoscopy;;   BIOPSY  09/02/2018   Procedure: BIOPSY;  Surgeon: Gatha Mayer, MD;  [facility];  Service: Endoscopy;;   BIOPSY  10/22/2018   Procedure: BIOPSY;  Surgeon: Milus Banister, MD;  [facility];  Service: Endoscopy;;   BREAST BIOPSY Bilateral    benign and a right stereotatic breast biopsy.   COLONOSCOPY     ESOPHAGOGASTRODUODENOSCOPY N/A 07/16/2017   Procedure: ESOPHAGOGASTRODUODENOSCOPY (EGD);  Surgeon: Ladene Artist, MD;  Location: [facility] ENDOSCOPY;  Service: Endoscopy;  Laterality: N/A;   ESOPHAGOGASTRODUODENOSCOPY N/A 10/22/2018   Procedure: ESOPHAGOGASTRODUODENOSCOPY (EGD);  Surgeon: Milus Banister, MD;  Location: [facility];  Service: Endoscopy;  Laterality: N/A;   ESOPHAGOGASTRODUODENOSCOPY (EGD) WITH PROPOFOL N/A 07/03/2018   Procedure: ESOPHAGOGASTRODUODENOSCOPY (EGD) WITH PROPOFOL;  Surgeon: Gatha Mayer, MD;  [facility];  Service: Endoscopy;  Laterality: N/A;   ESOPHAGOGASTRODUODENOSCOPY (EGD) WITH PROPOFOL N/A 09/02/2018   Procedure: ESOPHAGOGASTRODUODENOSCOPY (EGD) WITH PROPOFOL;  Surgeon: Gatha Mayer, MD;  [facility];  Service: Endoscopy;  Laterality: N/A;   EUS N/A 10/22/2018   Procedure: UPPER ENDOSCOPIC ULTRASOUND (EUS) RADIAL;  Surgeon: Milus Banister, MD;  [facility];  Service: Endoscopy;  Laterality: N/A;   fibroid excision  age 25   prolapsed fibroid removed   FINE NEEDLE ASPIRATION  10/22/2018   Procedure: FINE NEEDLE ASPIRATION;  Surgeon: Milus Banister, MD;  [facility];  Service: Endoscopy;;   FOOT SURGERY Left 2014   Dr. Paulla Dolly   FOOT SURGERY Right 08/2016   Dr. Paulla Dolly --Shortening Osteotomy with Fixation  1st Metatarsal    HAMMER TOE SURGERY Right 12/04/2021   4th toe; Dr. Paulla Dolly   MOUTH SURGERY  06/2017   CYST    PORT-A-CATH REMOVAL N/A 02/15/2016   Procedure: REMOVAL PORT-A-CATH;  Surgeon: Autumn Messing III, MD;  Location: [place];  Service: General;  Laterality: N/A;   RADIOACTIVE SEED GUIDED PARTIAL MASTECTOMY WITH AXILLARY SENTINEL LYMPH NODE BIOPSY Left 05/25/2015   Procedure: LEFT BREAST LUMPECTOMY WITH RADIOACTIVE SEED AND SENTINEL LYMPH NODE BIOPSY;  Surgeon:  Autumn Messing III, MD;  Location: [place];  Service: General;  Laterality: Left;   UPPER GASTROINTESTINAL ENDOSCOPY     Patient Active Problem List   Diagnosis Date Noted   Cognitive deficits 05/16/2020   Visual disturbance 05/16/2020   Sleep disturbance 04/04/2020   Post concussive syndrome 03/02/2020   Dizziness and giddiness 42/70/6237   Follicular lymphoma ([place]) 11/16/2018   Duodenum disorder    Upper gastrointestinal bleed 07/02/2018   Abdominal pain, epigastric 07/01/2018   Elevated lipase 07/01/2018   Elevated LFTs 07/01/2018   Iron deficiency anemia 08/17/2017   C. 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   Current dizziness: 5/10  Pt accompanied by: self  PERTINENT HISTORY:  Bipolar disorder, osteoporosis, history of breast cancer, history of lymphoma, history of skull fracture  PAIN:  Are you having pain?  No  There were no vitals filed for this visit.  Sitting and standing.   PRECAUTIONS: Fall   PATIENT GOALS "be less dizzy and improve my balance" "get my life back"   OBJECTIVE:   VESTIBULAR ASSESSMENT   [facility] PT Assessment - 06/12/22 0936       Functional Gait  Assessment   Gait assessed  Yes    Gait Level Surface Walks 20 ft in less than 5.5 sec, no assistive devices, good speed, no evidence for imbalance, normal gait pattern, deviates no more than 6 in outside of the 12 in walkway width.   5.4   Change in Gait Speed Able to smoothly change walking speed without loss of balance or gait deviation. Deviate no more than 6 in outside of the 12 in walkway width.    Gait with Horizontal Head Turns Performs head turns smoothly with slight change in gait velocity (eg, minor disruption to smooth gait path), deviates 6-10 in outside 12 in walkway width, or uses an assistive device.   mild wooziness   Gait with Vertical Head Turns Performs task with slight change in gait velocity (  eg, minor disruption to smooth gait path), deviates 6 - 10 in outside 12 in walkway width or uses assistive device   mild wooziness   Gait and Pivot Turn Pivot turns safely in greater than 3 sec and stops with no loss of balance, or pivot turns safely within 3 sec and stops with mild imbalance, requires small steps to catch balance.   mild wooziness   Step Over Obstacle Is able to step over 2 stacked shoe boxes taped together (9 in total height) without changing gait speed. No evidence of imbalance.    Gait with Narrow Base of Support Ambulates 7-9 steps.    Gait with Eyes Closed Walks 20 ft, slow speed, abnormal gait pattern, evidence for imbalance, deviates 10-15 in outside 12 in walkway width. Requires more than 9 sec to ambulate 20 ft.   11.75 seconds   Ambulating Backwards Walks 20 ft, slow speed, abnormal gait pattern, evidence for imbalance, deviates 10-15 in outside 12 in walkway width.   32.69 seconds   Steps  Alternating feet, no rail.    Total Score 22    FGA comment: 22/30 = medium fall risk             VESTIBULAR TREATMENT: Habituation: 180 deg Turns: comment: Performed from static stance x5 reps to each direction. Pt with mild postural sway, but did not report any dizziness. Progressed to walking with 180 degree turns (taking a few steps at countertop) and performed x3 reps each direction, pt with more unsteadiness and needing to use UE support at times at countertop and pt reporting dizziness incr to an 8/10 (from baseline 5/10).  Tandem Stance:  Surface: Floor Completed with: Eyes Open;   Time: x30 seconds bilat, incr postural sway, needing intermittent UE support    Access Code: [email or web address] URL: [email or web address] Date: 06/12/2022 Prepared by: Janann August  Added to HEP for balance. See MedBridge for further details.   Exercises - Wide Stance with Eyes Closed on Foam Pad  - 1-2 x daily - 5 x weekly - 3 sets - 30 hold - pt with mild postural sway.  - Wide Stance with Head Nods on Foam Pad  - 1-2 x daily - 5 x weekly - 2 sets - 10 reps - performed with EO 2 sets of 10 reps head nods, 2 sets of 10 reps head turns.  - Tandem Walking with Counter Support  - 1-2 x daily - 5 x weekly - 3 sets - performed slowly.          PATIENT EDUCATION: Education details: Further vestibular assessment findings, initial HEP for seated VOR x1.  Person educated: Patient Education method: Explanation, Demonstration, Verbal cues, and Handouts Education comprehension: verbalized understanding and returned demonstration  HEP:  Seated VOR x1 in vertical/horizontal directions x30 seconds  [id number]   GOALS: Goals reviewed with patient? Yes  SHORT TERM GOALS: Target date: 06/24/2022   Initial HEP Baseline: Goal status: INITIAL  2.  Pt will improve score on DHI by 5 points to demonstrate decreased disability level Baseline: 46 points (8/21) Goal status: INITIAL  3.  Pt  will improve score on m-CTSIB by 5 sec to demonstrate improved balance Baseline: 96/120 (8/21) Goal status: INITIAL  4.  Pt will undergo further assessment of FGA with LTG written. Baseline: 22/30  Goal status: MET   LONG TERM GOALS: Target date: 07/15/2022    Pt will be independent with final HEP for improved strength, balance, transfers and gait.  Baseline:  Goal  status: INITIAL  2.  Pt will improve score on DHI by 10 points to demonstrate decreased disability level Baseline: 46 points Goal status: INITIAL  3.  Pt will improve score on m-CTSIB by 10 points to demonstrate improved balance Baseline: 96/120 (8/21) Goal status: INITIAL  4.  Pt will improve DVA to a 3 line or less difference in order to demo improved VOR.  Baseline: 5 line difference  Goal status: INITIAL  5.  Pt will perform MSQ items and rate 0-1/5 dizziness in order to demo improved motion sensitivity.  Baseline: 2-3 dizziness  Goal status: INITIAL  6.  Pt will improve FGA to at least a 26/30 in order to demo decr fall risk.  Baseline:  Goal status: INITIAL  ASSESSMENT:  CLINICAL IMPRESSION: Performed the FGA with pt scoring a 22/30 indicating a medium fall risk. LTG updated as appropriate. Remainder of session focused on adding balance to HEP with pt tolerating well. Pt challenged by [facility] and head motion tasks on a single pillow. Worked on turning with gait, with pt needing UE support at times for balance and rating dizziness as an 8/10 (from baseline 5/10). Will continue to progress towards LTGs.    OBJECTIVE IMPAIRMENTS Abnormal gait, decreased balance, decreased knowledge of condition, decreased mobility, dizziness, impaired perceived functional ability, and impaired vision/preception.   ACTIVITY LIMITATIONS carrying, lifting, bending, squatting, stairs, transfers, bathing, and hygiene/grooming  PARTICIPATION LIMITATIONS: meal prep, cleaning, laundry, medication management, personal finances,  interpersonal relationship, driving, shopping, and community activity  PERSONAL FACTORS Past/current experiences, Time since onset of injury/illness/exacerbation, and 1-2 comorbidities:    Bipolar disorder, osteoporosis, history of breast cancer, history of lymphoma, history of skull fractureare also affecting patient's functional outcome.   REHAB POTENTIAL: Good  CLINICAL DECISION MAKING: Stable/uncomplicated  EVALUATION COMPLEXITY: Moderate   PLAN: PT FREQUENCY: 2x/week  PT DURATION: 6 weeks  PLANNED INTERVENTIONS: Therapeutic exercises, Therapeutic activity, Neuromuscular re-education, Balance training, Gait training, Patient/Family education, Self Care, Joint mobilization, Stair training, Vestibular training, Canalith repositioning, Visual/preceptual remediation/compensation, DME instructions, Cryotherapy, Moist heat, Manual therapy, and Re-evaluation  PLAN FOR NEXT SESSION:  how is HEP? Work on motion sensitivities, progress VOR, balance with head motions, EC, unlevel surfaces, narrow BOS, turning.    Arliss Journey, PT, DPT 06/12/2022, 12:31 PM

## 2022-06-18 ENCOUNTER — Ambulatory Visit: Payer: Medicare Other | Admitting: Diagnostic Neuroimaging

## 2022-06-19 ENCOUNTER — Ambulatory Visit: Payer: Medicare Other | Attending: Family Medicine | Admitting: Physical Therapy

## 2022-06-19 ENCOUNTER — Encounter: Payer: Self-pay | Admitting: Internal Medicine

## 2022-06-19 ENCOUNTER — Encounter: Payer: Self-pay | Admitting: Physical Therapy

## 2022-06-19 VITALS — BP 102/65 | HR 73

## 2022-06-19 DIAGNOSIS — R42 Dizziness and giddiness: Secondary | ICD-10-CM | POA: Insufficient documentation

## 2022-06-19 DIAGNOSIS — R2689 Other abnormalities of gait and mobility: Secondary | ICD-10-CM | POA: Diagnosis present

## 2022-06-19 DIAGNOSIS — R2681 Unsteadiness on feet: Secondary | ICD-10-CM | POA: Diagnosis present

## 2022-06-19 NOTE — Therapy (Signed)
OUTPATIENT PHYSICAL THERAPY VESTIBULAR TREATMENT     Patient Name: Rhonda Gardner MRN: [id number] DOB:[birth year], 66 y.o., female Today's Date: 06/19/2022  PCP: Rita Ohara, MD  REFERRING PROVIDER: Rita Ohara, MD    PT End of Session - 06/19/22 [phone]     Visit Number 4    Number of Visits 13   with eval   Date for PT Re-Evaluation 07/29/22   to allow for scheduling conflicts   Authorization Type Medicare    Progress Note Due on Visit 10    PT Start Time 0933    PT Stop Time 1014    PT Time Calculation (min) 41 min    Equipment Utilized During Treatment Gait belt    Activity Tolerance Patient tolerated treatment well    Behavior During Therapy [facility] for tasks assessed/performed             Past Medical History:  Diagnosis Date   Allergy 2006   chemical cauterization in spring 2006, and 2nd treatment with good results.(Dr.Shoemaker)   Bipolar disorder ([place])    Bleeding ulcer    Blood transfusion without reported diagnosis 07/2017   Bleeding ulcer   Breast cancer ([place]) 12/2014   invasive ductal carcinoma (LEFT)   Breast cancer of upper-outer quadrant of left female breast ([place]) 12/22/2014   Breast hematoma 12/19/2015   had left breast aspiration of 4cm hematoma-path indicates benign hematoma   C. difficile colitis    C. 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)   Osteoporosis 06/20/2016   T-2.5_0  on Dexa ([place])   Plantar fasciitis    PONV (postoperative nausea and vomiting)    Symptomatic anemia 07/16/2017   Past Surgical History:  Procedure Laterality Date   BIOPSY  07/03/2018   Procedure: BIOPSY;  Surgeon: Gatha Mayer, MD;  [facility];  Service: Endoscopy;;   BIOPSY  09/02/2018   Procedure: BIOPSY;  Surgeon: Gatha Mayer, MD;  [facility];  Service: Endoscopy;;   BIOPSY  10/22/2018   Procedure: BIOPSY;  Surgeon: Milus Banister, MD;  [facility];  Service: Endoscopy;;   BREAST BIOPSY Bilateral    benign and a right stereotatic breast biopsy.   COLONOSCOPY     ESOPHAGOGASTRODUODENOSCOPY N/A 07/16/2017   Procedure: ESOPHAGOGASTRODUODENOSCOPY (EGD);  Surgeon: Ladene Artist, MD;  Location: [facility] ENDOSCOPY;  Service: Endoscopy;  Laterality: N/A;   ESOPHAGOGASTRODUODENOSCOPY N/A 10/22/2018   Procedure: ESOPHAGOGASTRODUODENOSCOPY (EGD);  Surgeon: Milus Banister, MD;  Location: [facility];  Service: Endoscopy;  Laterality: N/A;   ESOPHAGOGASTRODUODENOSCOPY (EGD) WITH PROPOFOL N/A 07/03/2018   Procedure: ESOPHAGOGASTRODUODENOSCOPY (EGD) WITH PROPOFOL;  Surgeon: Gatha Mayer, MD;  [facility];  Service: Endoscopy;  Laterality: N/A;   ESOPHAGOGASTRODUODENOSCOPY (EGD) WITH PROPOFOL N/A 09/02/2018   Procedure: ESOPHAGOGASTRODUODENOSCOPY (EGD) WITH PROPOFOL;  Surgeon: Gatha Mayer, MD;  [facility];  Service: Endoscopy;  Laterality: N/A;   EUS N/A 10/22/2018   Procedure: UPPER ENDOSCOPIC ULTRASOUND (EUS) RADIAL;  Surgeon: Milus Banister, MD;  [facility];  Service: Endoscopy;  Laterality: N/A;   fibroid excision  age 22   prolapsed fibroid removed   FINE NEEDLE ASPIRATION  10/22/2018   Procedure: FINE NEEDLE ASPIRATION;  Surgeon: Milus Banister, MD;  [facility];  Service: Endoscopy;;   FOOT SURGERY Left 2014   Dr. Paulla Dolly   FOOT SURGERY Right 08/2016   Dr. Paulla Dolly --Shortening Osteotomy with Fixation  1st Metatarsal    HAMMER TOE SURGERY Right 12/04/2021   4th toe; Dr. Paulla Dolly   MOUTH SURGERY  06/2017   CYST    PORT-A-CATH REMOVAL N/A 02/15/2016   Procedure: REMOVAL PORT-A-CATH;  Surgeon: Autumn Messing III, MD;  Location: [place];  Service: General;  Laterality: N/A;   RADIOACTIVE SEED GUIDED PARTIAL MASTECTOMY WITH AXILLARY SENTINEL LYMPH NODE BIOPSY Left 05/25/2015   Procedure: LEFT BREAST LUMPECTOMY WITH RADIOACTIVE SEED AND SENTINEL LYMPH NODE BIOPSY;  Surgeon:  Autumn Messing III, MD;  Location: [place];  Service: General;  Laterality: Left;   UPPER GASTROINTESTINAL ENDOSCOPY     Patient Active Problem List   Diagnosis Date Noted   Cognitive deficits 05/16/2020   Visual disturbance 05/16/2020   Sleep disturbance 04/04/2020   Post concussive syndrome 03/02/2020   Dizziness and giddiness 19/41/7408   Follicular lymphoma ([place]) 11/16/2018   Duodenum disorder    Upper gastrointestinal bleed 07/02/2018   Abdominal pain, epigastric 07/01/2018   Elevated lipase 07/01/2018   Elevated LFTs 07/01/2018   Iron deficiency anemia 08/17/2017   C. 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 Came in less woozy today than last week. 5/10 dizziness coming in (previously in sessions was 8/10).   Pt accompanied by: self  PERTINENT HISTORY:  Bipolar disorder, osteoporosis, history of breast cancer, history of lymphoma, history of  skull fracture  PAIN:  Are you having pain? No  Vitals:   06/19/22 0944  BP: 102/65  Pulse: 73    Sitting and standing.   PRECAUTIONS: Fall   PATIENT GOALS "be less dizzy and improve my balance" "get my life back"   OBJECTIVE:   VESTIBULAR TREATMENT: Gaze Adaptation:                 x1 Viewing Horizontal: Position: Seated, Time: 30 seconds, 2 x 60 seconds and Comment: No sx with 30 seconds, mod sx with 60 seconds. Discussed can try progressing seated horizontal to 60 seconds at home.   x1 Viewing Vertical:  Position: Seated, Time: 2 x 30 seconds, and Comment: mod sx, more symptomatic than side to side.   Standing Balance: Surface: Floor Position: Narrow Base of Support Completed with: Eyes Closed; 3 x 30 seconds, mild postural sway  With feet apart EC: 2 sets of 10 reps head turns, 2 sets of 10 reps head nods    needing to tap to walls intermittently for balance.   Tandem Stance:  Surface: Floor Completed with: Eyes Open;   Time: 3/4  tandem x30 seconds bilat, full tandem 2 x 30 seconds bilat. Cues for core activation for balance. Pt needing to intermittently touch the wall/chair for balance.    Gait down hallway over 30', performed head turns x2 reps, head nods x2 reps, pt needing min guard and needing to touch wall at times for balance. More challenged by head turns.Pt reporting incr in dizziness.      Pt asking about walking program at home and is currently just walking in her driveway, discussed trial of a walking/trekking pole for improved stability for outdoor surfaces. Pt would like to trial at start of next session before she gets symptomatic with exercises.    PATIENT EDUCATION: Education details: Continue HEP, progressing seated horizontal VOR to 60 seconds.  Person educated: Patient Education method: Explanation, Demonstration, Verbal cues, and Handouts Education comprehension: verbalized understanding and returned demonstration  HEP:  Seated VOR x1 in  vertical (30 seconds)l/horizontal direction  (60 seconds) in seated.  [id number]   GOALS: Goals reviewed with patient? Yes  SHORT TERM GOALS: Target date: 06/24/2022   Initial HEP Baseline: Goal status: INITIAL  2.  Pt will improve score on DHI by 5 points to demonstrate decreased disability level Baseline: 46 points (8/21) Goal status: INITIAL  3.  Pt will improve score on m-CTSIB by 5 sec to demonstrate improved balance Baseline: 96/120 (8/21) Goal status: INITIAL  4.  Pt will undergo further assessment of FGA with LTG written. Baseline: 22/30  Goal status: MET   LONG TERM GOALS: Target date: 07/15/2022    Pt will be independent with final HEP for improved strength, balance, transfers and gait.  Baseline:  Goal status: INITIAL  2.  Pt will improve score on DHI by 10 points to demonstrate decreased disability level Baseline: 46 points Goal status: INITIAL  3.  Pt will improve score on m-CTSIB by 10 points to demonstrate improved balance Baseline: 96/120 (8/21) Goal status: INITIAL  4.  Pt will improve DVA to a 3 line or less difference in order to demo improved VOR.  Baseline: 5 line difference  Goal status: INITIAL  5.  Pt will perform MSQ items and rate 0-1/5 dizziness in order to demo improved motion sensitivity.  Baseline: 2-3 dizziness  Goal status: INITIAL  6.  Pt will improve FGA to at least a 26/30 in order to demo decr fall risk.  Baseline:  Goal status: INITIAL  ASSESSMENT:  CLINICAL IMPRESSION: Today's skilled session continued to focus on VOR exercises and balance strategies for incr vestibular input/narrow BOS. Pt challenged by narrow BOS tasks. Unable to progress seated VOR to 60 seconds with vertical head movements due to pt being more symptomatic. Pt reporting baseline dizziness/wooziness today at 5/10 and incr to 8/10 after exercises. Pt overall tolerated session well. Will continue to progress towards LTGs.    OBJECTIVE IMPAIRMENTS Abnormal  gait, decreased balance, decreased knowledge of condition, decreased mobility, dizziness, impaired perceived functional ability, and impaired vision/preception.   ACTIVITY LIMITATIONS carrying, lifting, bending, squatting, stairs, transfers, bathing, and hygiene/grooming  PARTICIPATION LIMITATIONS: meal prep, cleaning, laundry, medication management, personal finances, interpersonal relationship, driving, shopping, and community activity  PERSONAL FACTORS Past/current experiences, Time since onset of injury/illness/exacerbation, and 1-2 comorbidities:    Bipolar disorder, osteoporosis, history of breast cancer, history of lymphoma, history of skull fractureare also affecting patient's functional outcome.   REHAB POTENTIAL: Good  CLINICAL DECISION MAKING: Stable/uncomplicated  EVALUATION COMPLEXITY: Moderate   PLAN: PT FREQUENCY: 2x/week  PT DURATION: 6 weeks  PLANNED INTERVENTIONS:  Therapeutic exercises, Therapeutic activity, Neuromuscular re-education, Balance training, Gait training, Patient/Family education, Self Care, Joint mobilization, Stair training, Vestibular training, Canalith repositioning, Visual/preceptual remediation/compensation, DME instructions, Cryotherapy, Moist heat, Manual therapy, and Re-evaluation  PLAN FOR NEXT SESSION:  Try walking outdoors on unlevel surfaces with a  walking stick for improved stability (pt wants to get into walking). Work on motion sensitivities, progress VOR, balance with head motions, EC, unlevel surfaces, narrow BOS, turning.    Arliss Journey, PT, DPT 06/19/2022, 10:14 AM

## 2022-06-20 ENCOUNTER — Encounter: Payer: Self-pay | Admitting: Physical Therapy

## 2022-06-20 ENCOUNTER — Ambulatory Visit: Payer: Medicare Other | Admitting: Physical Therapy

## 2022-06-20 DIAGNOSIS — R42 Dizziness and giddiness: Secondary | ICD-10-CM | POA: Diagnosis not present

## 2022-06-20 DIAGNOSIS — R2681 Unsteadiness on feet: Secondary | ICD-10-CM

## 2022-06-20 NOTE — Therapy (Signed)
OUTPATIENT PHYSICAL THERAPY VESTIBULAR TREATMENT     Patient Name: Rhonda Gardner MRN: [id number] DOB:08-08-1956, 66 y.o., female Today's Date: 06/20/2022  PCP: Rita Ohara, MD  REFERRING PROVIDER: Rita Ohara, MD    PT End of Session - 06/20/22 1808     Visit Number 5    Number of Visits 13   with eval   Date for PT Re-Evaluation 07/29/22   to allow for scheduling conflicts   Authorization Type Medicare    Progress Note Due on Visit 10    PT Start Time 0934    PT Stop Time 1020    PT Time Calculation (min) 46 min    Equipment Utilized During Treatment Gait belt    Activity Tolerance Patient tolerated treatment well    Behavior During Therapy [facility] for tasks assessed/performed              Past Medical History:  Diagnosis Date   Allergy 2006   chemical cauterization in spring 2006, and 2nd treatment with good results.(Dr.Shoemaker)   Bipolar disorder ([place])    Bleeding ulcer    Blood transfusion without reported diagnosis 07/2017   Bleeding ulcer   Breast cancer ([place]) 12/2014   invasive ductal carcinoma (LEFT)   Breast cancer of upper-outer quadrant of left female breast ([place]) 12/22/2014   Breast hematoma 12/19/2015   had left breast aspiration of 4cm hematoma-path indicates benign hematoma   C. difficile colitis    C. 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, treated at [facility] (radiation)   Osteoporosis 06/20/2016   T-2.5'@spine'  on Dexa ([place])   Plantar fasciitis    PONV (postoperative nausea and vomiting)    Symptomatic anemia 07/16/2017   Past Surgical History:  Procedure Laterality Date   BIOPSY  07/03/2018   Procedure: BIOPSY;  Surgeon: Gatha Mayer, MD;  Location: [facility] ENDOSCOPY;  Service: Endoscopy;;   BIOPSY  09/02/2018   Procedure: BIOPSY;  Surgeon: Gatha Mayer, MD;  Location: [facility];  Service: Endoscopy;;   BIOPSY  10/22/2018   Procedure: BIOPSY;  Surgeon: Milus Banister, MD;  Location: [facility]  ENDOSCOPY;  Service: Endoscopy;;   BREAST BIOPSY Bilateral    benign and a right stereotatic breast biopsy.   COLONOSCOPY     ESOPHAGOGASTRODUODENOSCOPY N/A 07/16/2017   Procedure: ESOPHAGOGASTRODUODENOSCOPY (EGD);  Surgeon: Ladene Artist, MD;  Location: [facility] ENDOSCOPY;  Service: Endoscopy;  Laterality: N/A;   ESOPHAGOGASTRODUODENOSCOPY N/A 10/22/2018   Procedure: ESOPHAGOGASTRODUODENOSCOPY (EGD);  Surgeon: Milus Banister, MD;  Location: [facility];  Service: Endoscopy;  Laterality: N/A;   ESOPHAGOGASTRODUODENOSCOPY (EGD) WITH PROPOFOL N/A 07/03/2018   Procedure: ESOPHAGOGASTRODUODENOSCOPY (EGD) WITH PROPOFOL;  Surgeon: Gatha Mayer, MD;  Location: [facility];  Service: Endoscopy;  Laterality: N/A;   ESOPHAGOGASTRODUODENOSCOPY (EGD) WITH PROPOFOL N/A 09/02/2018   Procedure: ESOPHAGOGASTRODUODENOSCOPY (EGD) WITH PROPOFOL;  Surgeon: Gatha Mayer, MD;  Location: [facility];  Service: Endoscopy;  Laterality: N/A;   EUS N/A 10/22/2018   Procedure: UPPER ENDOSCOPIC ULTRASOUND (EUS) RADIAL;  Surgeon: Milus Banister, MD;  Location: [facility] ENDOS[facility];  Service: Endoscopy;  Laterality: N/A;   fibroid excision  age 73   prolapsed fibroid removed   FINE NEEDLE ASPIRATION  10/22/2018   Procedure: FINE NEEDLE ASPIRATION;  Surgeon: Milus Banister, MD;  Location: [facility] ENDOS[facility];  Service: Endoscopy;;   FOOT SURGERY Left 2014   Dr. Paulla Dolly   FOOT SURGERY Right 08/2016   Dr. Paulla Dolly --Shortening Osteotomy with  Fixation 1st Metatarsal    HAMMER TOE SURGERY Right 12/04/2021   4th toe; Dr. Paulla Dolly   MOUTH SURGERY  06/2017   CYST    PORT-A-CATH REMOVAL N/A 02/15/2016   Procedure: REMOVAL PORT-A-CATH;  Surgeon: Autumn Messing III, MD;  Location: [place];  Service: General;  Laterality: N/A;   RADIOACTIVE SEED GUIDED PARTIAL MASTECTOMY WITH AXILLARY SENTINEL LYMPH NODE BIOPSY Left 05/25/2015   Procedure: LEFT BREAST LUMPECTOMY WITH RADIOACTIVE SEED AND SENTINEL LYMPH NODE BIOPSY;  Surgeon:  Autumn Messing III, MD;  Location: [facility];  Service: General;  Laterality: Left;   UPPER GASTROINTESTINAL ENDOSCOPY     Patient Active Problem List   Diagnosis Date Noted   Cognitive deficits 05/16/2020   Visual disturbance 05/16/2020   Sleep disturbance 04/04/2020   Post concussive syndrome 03/02/2020   Dizziness and giddiness 81/27/5170   Follicular lymphoma ([place]) 11/16/2018   Duodenum disorder    Upper gastrointestinal bleed 07/02/2018   Abdominal pain, epigastric 07/01/2018   Elevated lipase 07/01/2018   Elevated LFTs 07/01/2018   Iron deficiency anemia 08/17/2017   C. 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  Pt reports doing exercises at home.    Pt accompanied by: self  PERTINENT HISTORY:  Bipolar disorder, osteoporosis, history of breast cancer, history of lymphoma, history of skull fracture  PAIN:  Are you  having pain? No  There were no vitals filed for this visit.   Sitting and standing.   PRECAUTIONS: Fall   PATIENT GOALS "be less dizzy and improve my balance" "get my life back"   OBJECTIVE:   VESTIBULAR TREATMENT: Gaze Adaptation:                 x1 Viewing Horizontal: Position: Standing Time: 60 seconds 1 rep - target on plain background - pt stood approx. 4' away; pt reported mild increase in symptoms after 1" but able to take standing rest break and continue with vertical x1 viewing ex.     x1 Viewing Vertical:  Position: Standing - target on plain background - 4' away:  60 secs x 1 rep  Standing Balance: Surface: 1 thin pillow Position: feet hip width apart Completed with: eyes open; head turns horizontal 5 reps;  vertical 5 reps;   With feet apart EC: standing 10 secs with no head movement; added horizontal head turns with EC 5 reps, vertical EC 5 reps; used walls for recovery of LOB  Marching on pillows - EO  10 reps:  EC 10 reps;  head turns with marching on pillow - horizontal 5 reps and then vertical 5 reps  Stepping down to floor with 1 foot with head turn to opposite side with CGA - 5 reps each LE    Standing on the 1 pillow in corner - pt performed trunk rotations 5 reps straight across; diagonal "X" pattern with EO 5 reps all 3 directions; progressed to performing all 3 directions with EC 5 reps with CGA to SBA; difficulty maintaining balance with EC with these exercises   Pt performed amb. 35' x 4 reps with horizontal head turns; 35' x 2 reps with vertical head turns  Pt amb. 30' x 1 rep with EC with SBA; pt veered toward Lt side   Rockerboard - anterior/posteriorly EO 10 reps; with UE support EC 10 reps: with 1 finger support on each hand 10 reps with EC     PATIENT EDUCATION: No changes made to HEP on 06-20-22  Education details: Continue HEP, progressing seated horizontal VOR to 60 seconds.  Person educated: Patient Education method: Explanation,  Demonstration, Verbal cues, and Handouts Education comprehension: verbalized understanding and returned demonstration  HEP:  Seated VOR x1 in vertical (30 seconds)l/horizontal direction  (60 seconds) in seated.  [id number]   GOALS: Goals reviewed with patient? Yes  SHORT TERM GOALS: Target date: 06/24/2022   Initial HEP Baseline: Goal status: INITIAL  2.  Pt will improve score on DHI by 5 points to demonstrate decreased disability level Baseline: 46 points (8/21) Goal status: INITIAL  3.  Pt will improve score on m-CTSIB by 5 sec to demonstrate improved balance Baseline: 96/120 (8/21) Goal status: INITIAL  4.  Pt will undergo further assessment of FGA with LTG written. Baseline: 22/30  Goal status: MET   LONG TERM GOALS: Target date: 07/15/2022    Pt will be independent with final HEP for improved strength, balance, transfers and gait.  Baseline:  Goal status: INITIAL  2.  Pt will improve score on DHI by 10 points to demonstrate decreased disability level Baseline: 46 points Goal status: INITIAL  3.  Pt will improve score on m-CTSIB by 10 points to demonstrate improved balance Baseline: 96/120 (8/21) Goal status: INITIAL  4.  Pt will improve DVA to a 3 line or less difference in order to demo improved VOR.  Baseline: 5 line difference  Goal status: INITIAL  5.  Pt will perform MSQ items and rate 0-1/5 dizziness in order to demo improved motion sensitivity.  Baseline: 2-3 dizziness  Goal status: INITIAL  6.  Pt will improve FGA to at least a 26/30 in order to demo decr fall risk.  Baseline:  Goal status: INITIAL  ASSESSMENT:  CLINICAL IMPRESSION: PT session focused on balance/vestibular exercises standing on compliant surface (1 thin pillow) with EO and EC and with head turns incorporated to increase vestibular input and function in maintaining balance.  Pt is most challenged by activities with EC - had significant postural instability with exercises with EC.   Pt was able to progress with x1 viewing exercise in today's session, performing ex. In standing for 60 secs both horizontal and vertically.  Cont with POC.   OBJECTIVE IMPAIRMENTS Abnormal gait, decreased balance, decreased knowledge of condition, decreased mobility, dizziness, impaired perceived functional ability, and impaired vision/preception.   ACTIVITY LIMITATIONS carrying, lifting, bending, squatting, stairs, transfers, bathing, and hygiene/grooming  PARTICIPATION LIMITATIONS: meal prep, cleaning, laundry, medication management, personal finances, interpersonal relationship, driving, shopping, and community activity  PERSONAL FACTORS Past/current experiences, Time since onset of injury/illness/exacerbation, and 1-2 comorbidities:    Bipolar disorder, osteoporosis, history of breast cancer, history of lymphoma, history of skull fractureare also affecting patient's functional outcome.   REHAB POTENTIAL: Good  CLINICAL DECISION MAKING: Stable/uncomplicated  EVALUATION COMPLEXITY: Moderate   PLAN: PT FREQUENCY: 2x/week  PT DURATION: 6 weeks  PLANNED INTERVENTIONS: Therapeutic exercises, Therapeutic activity, Neuromuscular re-education, Balance training, Gait training, Patient/Family education, Self Care, Joint mobilization, Stair training, Vestibular training, Canalith repositioning, Visual/preceptual remediation/compensation, DME instructions, Cryotherapy, Moist heat, Manual therapy, and Re-evaluation  PLAN FOR NEXT SESSION:  Check STG's:  Try walking outdoors on unlevel surfaces with a  walking stick for improved stability (pt wants to get into walking). Work on motion sensitivities, progress VOR, balance with head motions, EC, unlevel surfaces, narrow BOS, turning.    Alda Lea, PT 06/20/2022, 6:10 PM

## 2022-06-24 ENCOUNTER — Encounter: Payer: Self-pay | Admitting: Physical Therapy

## 2022-06-24 ENCOUNTER — Ambulatory Visit: Payer: Medicare Other | Admitting: Physical Therapy

## 2022-06-24 DIAGNOSIS — R42 Dizziness and giddiness: Secondary | ICD-10-CM

## 2022-06-24 DIAGNOSIS — R2689 Other abnormalities of gait and mobility: Secondary | ICD-10-CM

## 2022-06-24 DIAGNOSIS — R2681 Unsteadiness on feet: Secondary | ICD-10-CM

## 2022-06-24 NOTE — Therapy (Signed)
OUTPATIENT PHYSICAL THERAPY VESTIBULAR TREATMENT     Patient Name: Rhonda Gardner MRN: [id number] DOB:[birth year], 66 y.o., female Today's Date: 06/24/2022  PCP: Rita Ohara, MD  REFERRING PROVIDER: Rita Ohara, MD    PT End of Session - 06/24/22 1044     Visit Number 6    Number of Visits 13   with eval   Date for PT Re-Evaluation 07/29/22   to allow for scheduling conflicts   Authorization Type Medicare    Progress Note Due on Visit 10    PT Start Time 0932    PT Stop Time 1016    PT Time Calculation (min) 44 min    Equipment Utilized During Treatment Other (comment)   trekking pole   Activity Tolerance Patient tolerated treatment well    Behavior During Therapy [facility] for tasks assessed/performed               Past Medical History:  Diagnosis Date   Allergy 2006   chemical cauterization in spring 2006, and 2nd treatment with good results.(Dr.Shoemaker)   Bipolar disorder ([place])    Bleeding ulcer    Blood transfusion without reported diagnosis 07/2017   Bleeding ulcer   Breast cancer ([place]) 12/2014   invasive ductal carcinoma (LEFT)   Breast cancer of upper-outer quadrant of left female breast ([place]) 12/22/2014   Breast hematoma 12/19/2015   had left breast aspiration of 4cm hematoma-path indicates benign hematoma   C. difficile colitis    C. 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, treated at [facility] (radiation)   Osteoporosis 06/20/2016   T-2.5'@spine'  on Dexa ([place])   Plantar fasciitis    PONV (postoperative nausea and vomiting)    Symptomatic anemia 07/16/2017   Past Surgical History:  Procedure Laterality Date   BIOPSY  07/03/2018   Procedure: BIOPSY;  Surgeon: Gatha Mayer, MD;  Location: [facility];  Service: Endoscopy;;   BIOPSY  09/02/2018   Procedure: BIOPSY;  Surgeon: Gatha Mayer, MD;  Location: [facility];  Service: Endoscopy;;   BIOPSY  10/22/2018   Procedure: BIOPSY;  Surgeon: Milus Banister, MD;  Location: [facility];  Service: Endoscopy;;   BREAST BIOPSY Bilateral    benign and a right stereotatic breast biopsy.   COLONOSCOPY     ESOPHAGOGASTRODUODENOSCOPY N/A 07/16/2017   Procedure: ESOPHAGOGASTRODUODENOSCOPY (EGD);  Surgeon: Ladene Artist, MD;  Location[facility] END[facility]COPY;  Service: Endoscopy;  Laterality: N/A;   ESOPHAGOGASTRODUODENOSCOPY N/A 10/22/2018   Procedure: ESOPHAGOGASTRODUODENOSCOPY (EGD);  Surgeon: Milus Banister, MD;  Location: [facility];  Service: Endoscopy;  Laterality: N/A;   ESOPHAGOGASTRODUODENOSCOPY (EGD) WITH PROPOFOL N/A 07/03/2018   Procedure: ESOPHAGOGASTRODUODENOSCOPY (EGD) WITH PROPOFOL;  Surgeon: Gatha Mayer, MD;  Location: [facility];  Service: Endoscopy;  Laterality: N/A;   ESOPHAGOGASTRODUODENOSCOPY (EGD) WITH PROPOFOL N/A 09/02/2018   Procedure: ESOPHAGOGASTRODUODENOSCOPY (EGD) WITH PROPOFOL;  Surgeon: Gatha Mayer, MD;  Location: [facility];  Service: Endoscopy;  Laterality: N/A;   EUS N/A 10/22/2018   Procedure: UPPER ENDOSCOPIC ULTRASOUND (EUS) RADIAL;  Surgeon: Milus Banister, MD;  Location: [facility];  Service: Endoscopy;  Laterality: N/A;   fibroid excision  age 7   prolapsed fibroid removed   FINE NEEDLE ASPIRATION  10/22/2018   Procedure: FINE NEEDLE ASPIRATION;  Surgeon: Milus Banister, MD;  Location: [facility];  Service: Endoscopy;;   FOOT SURGERY Left 2014   Dr. Paulla Dolly   FOOT SURGERY Right 08/2016   Dr.  Regal --Shortening Osteotomy with Fixation 1st Metatarsal    HAMMER TOE SURGERY Right 12/04/2021   4th toe; Dr. Paulla Dolly   MOUTH SURGERY  06/2017   CYST    PORT-A-CATH REMOVAL N/A 02/15/2016   Procedure: REMOVAL PORT-A-CATH;  Surgeon: Autumn Messing III, MD;  Location: [facility];  Service: General;  Laterality: N/A;   RADIOACTIVE SEED GUIDED PARTIAL MASTECTOMY WITH AXILLARY SENTINEL LYMPH NODE BIOPSY Left 05/25/2015   Procedure: LEFT BREAST LUMPECTOMY WITH RADIOACTIVE SEED AND SENTINEL  LYMPH NODE BIOPSY;  Surgeon: Autumn Messing III, MD;  Location: [facility];  Service: General;  Laterality: Left;   UPPER GASTROINTESTINAL ENDOSCOPY     Patient Active Problem List   Diagnosis Date Noted   Cognitive deficits 05/16/2020   Visual disturbance 05/16/2020   Sleep disturbance 04/04/2020   Post concussive syndrome 03/02/2020   Dizziness and giddiness 68/12/2120   Follicular lymphoma ([place] 11/16/2018   Duodenum disorder    Upper gastrointestinal bleed 07/02/2018   Abdominal pain, epigastric 07/01/2018   Elevated lipase 07/01/2018   Elevated LFTs 07/01/2018   Iron deficiency anemia 08/17/2017   C. 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   Allergy to adhesive tape 01/25/2015   Malignant neoplasm of upper-outer quadrant of left breast in female, estrogen receptor negative ([place]) 12/22/2014    ONSET DATE: 05/06/2022   REFERRING DIAG: R41.89 (ICD-10-CM) - Cognitive deficits Z87.81 (ICD-10-CM) - History of skull fracture R26.89 (ICD-10-CM) - Balance problem   THERAPY DIAG:  Unsteadiness on feet  Dizziness and giddiness  Other abnormalities of gait and mobility  Rationale for Evaluation and Treatment Rehabilitation  SUBJECTIVE:   SUBJECTIVE STATEMENT: Pt reports she is feeling better today than she was last Thursday; reports she did have a lot of dizziness over the weekend; pt asks if her HEP needs to be updated - states she has started doing x1 viewing exercise in standing (not in seated position) since  last session   Pt accompanied by: self  PERTINENT HISTORY:  Bipolar disorder, osteoporosis, history of breast cancer, history of lymphoma, history of skull fracture  PAIN:  Are you having pain? No  There were no vitals filed for this visit.   Sitting and standing.   PRECAUTIONS: Fall   PATIENT GOALS "be less dizzy and improve my balance" "get my life back"   OBJECTIVE:   Gait:    [facility] PT Assessment - 06/24/22 0001       Functional Gait  Assessment   Gait Level Surface Walks 20 ft in less than 5.5 sec, no assistive devices, good speed, no evidence for imbalance, normal gait pattern, deviates no more than 6 in outside of the 12 in walkway width.    Change in Gait Speed Able to smoothly change walking speed without loss of balance or gait deviation. Deviate no more than 6 in outside of the 12 in walkway width.    Gait with Horizontal Head Turns Performs head turns smoothly with slight change in gait velocity (eg, minor disruption to smooth gait path), deviates 6-10  in outside 12 in walkway width, or uses an assistive device.    Gait with Vertical Head Turns Performs head turns with no change in gait. Deviates no more than 6 in outside 12 in walkway width.    Gait and Pivot Turn Pivot turns safely within 3 sec and stops quickly with no loss of balance.    Step Over Obstacle Is able to step over 2 stacked shoe boxes taped together (9 in total height) without changing gait speed. No evidence of imbalance.    Gait with Narrow Base of Support Ambulates 4-7 steps.   4 steps   Gait with Eyes Closed Walks 20 ft, slow speed, abnormal gait pattern, evidence for imbalance, deviates 10-15 in outside 12 in walkway width. Requires more than 9 sec to ambulate 20 ft.    Ambulating Backwards Walks 20 ft, uses assistive device, slower speed, mild gait deviations, deviates 6-10 in outside 12 in walkway width.    Steps Alternating feet, no rail.    Total Score 24            Gait training:  pt  used 1 trekking pole (in RUE) for assistance with outdoor ambulation - pt amb. On paved surfaces outdoors approx. 500' with SBA; pt needed no cues for correct sequence with use of trekking pole;  reported dizziness 8/10 intensity after amb. Approx. 300' - able to continue with ambulation and complete additional 200' distance back into clinic; pt took short seated rest break (approx. 2") and able to resume treatment; pt reported there were about 3 occurrences during this distance (500') that she felt somewhat unstable and relied on trekking pole for assistance with balance  VESTIBULAR TREATMENT:   Standing Balance: Surface: 2 pillows Position: feet hip width apart Completed with: eyes open; head turns horizontal 5 reps;  vertical 5 reps;   MCTSIB; Condition 1 - 30 secs:  Condition 2 - 30 secs:  Condition 3 - 24.6 secs:  Condition 4 - 7.19 secs = 91/120  Marching on pillows - EO 10 reps:  pt performed marching with horizontal head turns 5 reps with EO:  marching with vertical head turns 5 reps with EO - added these exercises to HEP  UPDATED HEP:   Medbridge Access Code: [email or web address] URL: [email or web address] Date: 06/24/2022 Prepared by: Ethelene Browns  Exercises - Backward Walking with Counter Support  - 1 x daily - 7 x weekly - 1 sets - 3-4 reps - Standing March  - 1 x daily - 7 x weekly - 3 sets - 10 reps      PATIENT EDUCATION:   HEP updated on 06-24-22:  Medbridge [email or web address]  Education details: added above exercises and x1 viewing in standing was added on 06-20-22 Person educated: Patient Education method: Explanation, Demonstration, Verbal cues, and Handouts Education comprehension: verbalized understanding and returned demonstration  HEP:  Seated VOR x1 in vertical (30 seconds)l/horizontal direction  (60 seconds) in seated.  [id number]   GOALS: Goals reviewed with patient? Yes  SHORT TERM GOALS: Target date: 06/24/2022   Initial HEP Baseline: Goal status: Goal met  06-24-22  2.  Pt will improve score on DHI by 5 points to demonstrate decreased disability level Baseline: 46 points (8/21) Goal status: Ongoing - 06-24-22 - will reassess at D/C  3.  Pt will improve score on m-CTSIB by 5 sec to demonstrate improved balance Baseline: 96/120 (8/21) :   91/120 on 06-24-22 Goal status: Goal not met 06-24-22:    4.  Pt will undergo  further assessment of FGA with LTG written. Baseline: 22/30 ; score 24/30 on 06-24-22 Goal status: MET   LONG TERM GOALS: Target date: 07/15/2022    Pt will be independent with final HEP for improved strength, balance, transfers and gait.  Baseline:  Goal status: INITIAL  2.  Pt will improve score on DHI by 10 points to demonstrate decreased disability level Baseline: 46 points Goal status: INITIAL  3.  Pt will improve score on m-CTSIB by 10 points to demonstrate improved balance Baseline: 96/120 (8/21) Goal status: INITIAL  4.  Pt will improve DVA to a 3 line or less difference in order to demo improved VOR.  Baseline: 5 line difference  Goal status: INITIAL  5.  Pt will perform MSQ items and rate 0-1/5 dizziness in order to demo improved motion sensitivity.  Baseline: 2-3 dizziness  Goal status: INITIAL  6.  Pt will improve FGA to at least a 26/30 in order to demo decr fall risk.  Baseline:  Goal status: INITIAL  ASSESSMENT:  CLINICAL IMPRESSION: Pt has met STG's #1 and 4:  DHI was not assessed in today's session due to time constraint and also due to pt having completed this only 3 weeks ago.  MCTSIB score decreased from 96/120 to 91/120, therefore, STG #3 not met in today's session.  FGA score has increased from 22/30 to 24/30.  Pt's performance appears to fluctuate depending on intensity of dizziness.   Cont with POC.   OBJECTIVE IMPAIRMENTS Abnormal gait, decreased balance, decreased knowledge of condition, decreased mobility, dizziness, impaired perceived functional ability, and impaired vision/preception.    ACTIVITY LIMITATIONS carrying, lifting, bending, squatting, stairs, transfers, bathing, and hygiene/grooming  PARTICIPATION LIMITATIONS: meal prep, cleaning, laundry, medication management, personal finances, interpersonal relationship, driving, shopping, and community activity  PERSONAL FACTORS Past/current experiences, Time since onset of injury/illness/exacerbation, and 1-2 comorbidities:    Bipolar disorder, osteoporosis, history of breast cancer, history of lymphoma, history of skull fractureare also affecting patient's functional outcome.   REHAB POTENTIAL: Good  CLINICAL DECISION MAKING: Stable/uncomplicated  EVALUATION COMPLEXITY: Moderate   PLAN: PT FREQUENCY: 2x/week  PT DURATION: 6 weeks  PLANNED INTERVENTIONS: Therapeutic exercises, Therapeutic activity, Neuromuscular re-education, Balance training, Gait training, Patient/Family education, Self Care, Joint mobilization, Stair training, Vestibular training, Canalith repositioning, Visual/preceptual remediation/compensation, DME instructions, Cryotherapy, Moist heat, Manual therapy, and Re-evaluation  PLAN FOR NEXT SESSION:    Check updated HEP for any questions or problems; cont to Work on motion sensitivities, progress VOR, balance with head motions, EC, unlevel surfaces, narrow BOS, turning.  Pt had no difficulty with amb. Outside with trekking pole - did report increased dizziness with this activity.    Alda Lea, PT 06/24/2022, 6:08 PM

## 2022-06-26 ENCOUNTER — Encounter: Payer: Self-pay | Admitting: Physical Therapy

## 2022-06-26 ENCOUNTER — Ambulatory Visit: Payer: Medicare Other | Admitting: Physical Therapy

## 2022-06-26 DIAGNOSIS — R42 Dizziness and giddiness: Secondary | ICD-10-CM

## 2022-06-26 DIAGNOSIS — R2689 Other abnormalities of gait and mobility: Secondary | ICD-10-CM

## 2022-06-26 DIAGNOSIS — R2681 Unsteadiness on feet: Secondary | ICD-10-CM

## 2022-06-26 NOTE — Therapy (Signed)
OUTPATIENT PHYSICAL THERAPY VESTIBULAR TREATMENT     Patient Name: Rhonda Gardner MRN: [id number] DOB:January 27, 1956, 66 y.o., female Today's Date: 06/26/2022  PCP: Rita Ohara, MD  REFERRING PROVIDER: Rita Ohara, MD    PT End of Session - 06/26/22 0933     Visit Number 7    Number of Visits 13   with eval   Date for PT Re-Evaluation 07/29/22   to allow for scheduling conflicts   Authorization Type Medicare    Progress Note Due on Visit 10    PT Start Time 0933    PT Stop Time 1015    PT Time Calculation (min) 42 min    Equipment Utilized During Treatment --    Activity Tolerance Patient tolerated treatment well    Behavior During Therapy [facility] for tasks assessed/performed               Past Medical History:  Diagnosis Date   Allergy 2006   chemical cauterization in spring 2006, and 2nd treatment with good results.(Dr.Shoemaker)   Bipolar disorder ([place])    Bleeding ulcer    Blood transfusion without reported diagnosis 07/2017   Bleeding ulcer   Breast cancer ([place]) 12/2014   invasive ductal carcinoma (LEFT)   Breast cancer of upper-outer quadrant of left female breast ([place]) 12/22/2014   Breast hematoma 12/19/2015   had left breast aspiration of 4cm hematoma-path indicates benign hematoma   C. difficile colitis    C. 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)   Osteoporosis 06/20/2016   T-2.5'@spine'  on Dexa ([place])   Plantar fasciitis    PONV (postoperative nausea and vomiting)    Symptomatic anemia 07/16/2017   Past Surgical History:  Procedure Laterality Date   BIOPSY  07/03/2018   Procedure: BIOPSY;  Surgeon: Gatha Mayer, MD;  [facility]: [facility];  Service: Endoscopy;;   BIOPSY  09/02/2018   Procedure: BIOPSY;  Surgeon: Gatha Mayer, MD;  [facility];  Service: Endoscopy;;   BIOPSY  10/22/2018   Procedure: BIOPSY;  Surgeon: Milus Banister, MD;  [facility];  Service: Endoscopy;;   BREAST BIOPSY Bilateral    benign and a right stereotatic breast biopsy.   COLONOSCOPY     ESOPHAGOGASTRODUODENOSCOPY N/A 07/16/2017   Procedure: ESOPHAGOGASTRODUODENOSCOPY (EGD);  Surgeon: Ladene Artist, MD;  Location: [facility] ENDOSCOPY;  Service: Endoscopy;  Laterality: N/A;   ESOPHAGOGASTRODUODENOSCOPY N/A 10/22/2018   Procedure: ESOPHAGOGASTRODUODENOSCOPY (EGD);  Surgeon: Milus Banister, MD;  Location: [facility];  Service: Endoscopy;  Laterality: N/A;   ESOPHAGOGASTRODUODENOSCOPY (EGD) WITH PROPOFOL N/A 07/03/2018   Procedure: ESOPHAGOGASTRODUODENOSCOPY (EGD) WITH PROPOFOL;  Surgeon: Gatha Mayer, MD;  [facility];  Service: Endoscopy;  Laterality: N/A;   ESOPHAGOGASTRODUODENOSCOPY (EGD) WITH PROPOFOL N/A 09/02/2018   Procedure: ESOPHAGOGASTRODUODENOSCOPY (EGD) WITH PROPOFOL;  Surgeon: Gatha Mayer, MD;  [facility];  Service: Endoscopy;  Laterality: N/A;   EUS N/A 10/22/2018   Procedure: UPPER ENDOSCOPIC ULTRASOUND (EUS) RADIAL;  Surgeon: Milus Banister, MD;  [facility];  Service: Endoscopy;  Laterality: N/A;   fibroid excision  age 73   prolapsed fibroid removed   FINE NEEDLE ASPIRATION  10/22/2018   Procedure: FINE NEEDLE ASPIRATION;  Surgeon: Milus Banister, MD;  [facility];  Service: Endoscopy;;   FOOT SURGERY Left 2014   Dr. Paulla Dolly   FOOT SURGERY Right 08/2016   Dr. Paulla Dolly --Shortening Osteotomy with  Fixation 1st Metatarsal    HAMMER TOE SURGERY Right 12/04/2021   4th toe; Dr. Paulla Dolly   MOUTH SURGERY  06/2017   CYST    PORT-A-CATH REMOVAL N/A 02/15/2016   Procedure: REMOVAL PORT-A-CATH;  Surgeon: Autumn Messing III, MD;  Location: [place];  Service: General;  Laterality: N/A;   RADIOACTIVE SEED GUIDED PARTIAL MASTECTOMY WITH AXILLARY SENTINEL LYMPH NODE BIOPSY Left 05/25/2015   Procedure: LEFT BREAST LUMPECTOMY WITH RADIOACTIVE SEED AND SENTINEL LYMPH NODE BIOPSY;  Surgeon:  Autumn Messing III, MD;  Location: [place];  Service: General;  Laterality: Left;   UPPER GASTROINTESTINAL ENDOSCOPY     Patient Active Problem List   Diagnosis Date Noted   Cognitive deficits 05/16/2020   Visual disturbance 05/16/2020   Sleep disturbance 04/04/2020   Post concussive syndrome 03/02/2020   Dizziness and giddiness 40/07/2724   Follicular lymphoma ([place]) 11/16/2018   Duodenum disorder    Upper gastrointestinal bleed 07/02/2018   Abdominal pain, epigastric 07/01/2018   Elevated lipase 07/01/2018   Elevated LFTs 07/01/2018   Iron deficiency anemia 08/17/2017   C. 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 Doing well this week. Always more unsteady in the evenings.    Pt accompanied by: self  PERTINENT HISTORY:  Bipolar disorder, osteoporosis, history of breast cancer, history of lymphoma, history of skull fracture  PAIN:  Are you having pain?  No  There were no vitals filed for this visit.   Sitting and standing.   PRECAUTIONS: Fall   PATIENT GOALS "be less dizzy and improve my balance" "get my life back"   OBJECTIVE:   VESTIBULAR TREATMENT:  Gaze Adaptation:   x1 Viewing Horizontal: Position: Standing Feet Apart, Time: 60, Reps: 2, and Comment: pt reporting feeling pretty woozy.  and x1 Viewing Vertical:  Position: Standing Feet Apart, Time: 60, Reps: 2, and Comment: Pt reporting less sx with up and down compared to side to side    Standing Balance:  Tandem Walking: on blue mat, down and back x3 reps, cues for slowed pace, intermittent UE support.   Rockerboard: In A/P direction: EO head turns x10 reps, head nods x10 reps. Performed EC and holding board steady 2 x 30 seconds. Repeated same activity in M/L direction. Pt more unsteady in the M/L direction, needing intermittent support for balance  Walking with EC in // bars, forwards and backwards down and back x3 reps, pt more challenged going backwards, tendency to lose to the L. Needing to use UE support for balance as needed.  Standing Balance: Surface: Airex Position: Feet Hip Width Apart Completed with: Eyes Closed; Head Turns x 10 Reps and Head Nods x 10 Reps  Performed x10 reps diagonal reps in each direction.    With EC: with feet apart, x5 reps weight shifting onto toes then heels.    Pt losing balance posteriorly at times and need the wall to maintain balance.     PATIENT EDUCATION  Education details: Answered pt's questions (reviewed results of outcome measures at last session and how they are scored, how to safely perform EC gait at home at the countertop as pt wants to perform for HEP).  Person educated: Patient Education method: Explanation, Demonstration, and Verbal cues Education comprehension: verbalized understanding and returned demonstration  HEP:  Standing VOR x1 60 seconds, walking forwards with EC at countertop [id number]  HEP updated  on 06-24-22:  [employer] [email or web address]   GOALS: Goals reviewed with patient? Yes  SHORT TERM GOALS: Target date: 06/24/2022   Initial HEP Baseline: Goal status: Goal met 06-24-22  2.  Pt will improve score on DHI by 5 points to demonstrate decreased disability level Baseline: 46 points (8/21) Goal status: Ongoing - 06-24-22 - will reassess at D/C  3.  Pt will improve score on m-CTSIB by 5 sec to demonstrate improved balance Baseline: 96/120 (8/21) :   91/120 on 06-24-22 Goal status: Goal not met 06-24-22:    4.  Pt will undergo further assessment of FGA with LTG written. Baseline: 22/30 ; score 24/30 on 06-24-22 Goal status: MET   LONG TERM GOALS: Target date: 07/15/2022    Pt will be independent with final HEP for improved strength, balance, transfers and gait.  Baseline:  Goal status: INITIAL  2.  Pt will improve score on DHI by 10 points to demonstrate decreased disability level Baseline: 46 points Goal status: INITIAL  3.  Pt will improve score on m-CTSIB by 10 points to demonstrate improved balance Baseline: 96/120 (8/21) Goal status: INITIAL  4.  Pt will improve DVA to a 3 line or less difference in order to demo improved VOR.  Baseline: 5 line difference  Goal status: INITIAL  5.  Pt will perform MSQ items and rate 0-1/5 dizziness in order to demo improved motion sensitivity.  Baseline: 2-3 dizziness  Goal status: INITIAL  6.  Pt will improve FGA to at least a 26/30 in order to demo decr fall risk.  Baseline:  Goal status: INITIAL  ASSESSMENT:  CLINICAL IMPRESSION: Today's skilled session continued to focus on VOR and progressing balance with incr vestibular input, compliant surfaces, and narrow BOS. Pt more symptomatic with VOR in standing in the horizontal direction. Pt needing a seated rest break after 2 reps. Will continue to progress towards LTGs.    OBJECTIVE IMPAIRMENTS Abnormal gait, decreased balance, decreased knowledge of condition, decreased mobility,  dizziness, impaired perceived functional ability, and impaired vision/preception.   ACTIVITY LIMITATIONS carrying, lifting, bending, squatting, stairs, transfers, bathing, and hygiene/grooming  PARTICIPATION LIMITATIONS: meal prep, cleaning, laundry, medication management, personal finances, interpersonal relationship, driving, shopping, and community activity  PERSONAL FACTORS Past/current experiences, Time since onset of injury/illness/exacerbation, and 1-2 comorbidities:    Bipolar disorder, osteoporosis, history of breast cancer, history of lymphoma, history of skull fractureare also affecting patient's functional outcome.   REHAB POTENTIAL: Good  CLINICAL DECISION MAKING: Stable/uncomplicated  EVALUATION COMPLEXITY: Moderate   PLAN: PT FREQUENCY: 2x/week  PT DURATION: 6 weeks  PLANNED INTERVENTIONS: Therapeutic exercises, Therapeutic activity, Neuromuscular re-education, Balance training, Gait training, Patient/Family education,  Self Care, Joint mobilization, Stair training, Vestibular training, Canalith repositioning, Visual/preceptual remediation/compensation, DME instructions, Cryotherapy, Moist heat, Manual therapy, and Re-evaluation  PLAN FOR NEXT SESSION:    Cont to Work on motion sensitivities, progress VOR, balance with head motions, EC, unlevel surfaces, narrow BOS, turning.  Pt had no difficulty with amb. Outside with trekking pole - did report increased dizziness with this activity.    Arliss Journey, PT 06/26/2022, 12:18 PM

## 2022-07-01 ENCOUNTER — Encounter: Payer: Self-pay | Admitting: Physical Therapy

## 2022-07-01 ENCOUNTER — Ambulatory Visit: Payer: Medicare Other | Admitting: Physical Therapy

## 2022-07-01 DIAGNOSIS — R2681 Unsteadiness on feet: Secondary | ICD-10-CM

## 2022-07-01 DIAGNOSIS — R42 Dizziness and giddiness: Secondary | ICD-10-CM | POA: Diagnosis not present

## 2022-07-01 NOTE — Therapy (Signed)
OUTPATIENT PHYSICAL THERAPY VESTIBULAR TREATMENT     Patient Name: Rhonda Gardner MRN: [id number] DOB:[birth year], 66 y.o., female Today's Date: 07/01/2022  PCP: Rita Ohara, MD  REFERRING PROVIDER: Rita Ohara, MD    PT End of Session - 07/01/22 1834     Visit Number 8    Number of Visits 13   with eval   Date for PT Re-Evaluation 07/29/22   to allow for scheduling conflicts   Authorization Type Medicare    Progress Note Due on Visit 10    PT Start Time 0932    PT Stop Time 1016    PT Time Calculation (min) 44 min    Activity Tolerance Patient tolerated treatment well    Behavior During Therapy [facility] for tasks assessed/performed                Past Medical History:  Diagnosis Date   Allergy 2006   chemical cauterization in spring 2006, and 2nd treatment with good results.(Dr.Shoemaker)   Bipolar disorder ([place])    Bleeding ulcer    Blood transfusion without reported diagnosis 07/2017   Bleeding ulcer   Breast cancer ([place]) 12/2014   invasive ductal carcinoma (LEFT)   Breast cancer of upper-outer quadrant of left female breast ([place]) 12/22/2014   Breast hematoma 12/19/2015   had left breast aspiration of 4cm hematoma-path indicates benign hematoma   C. difficile colitis    C. 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, treated at [facility] (radiation)   Osteoporosis 06/20/2016   T-2.5'@spine'  on Dexa ([place])   Plantar fasciitis    PONV (postoperative nausea and vomiting)    Symptomatic anemia 07/16/2017   Past Surgical History:  Procedure Laterality Date   BIOPSY  07/03/2018   Procedure: BIOPSY;  Surgeon: Gatha Mayer, MD;  [facility];  Service: Endoscopy;;   BIOPSY  09/02/2018   Procedure: BIOPSY;  Surgeon: Gatha Mayer, MD;  [facility];  Service: Endoscopy;;   BIOPSY  10/22/2018   Procedure: BIOPSY;  Surgeon: Milus Banister, MD;  [facility];  Service: Endoscopy;;   BREAST  BIOPSY Bilateral    benign and a right stereotatic breast biopsy.   COLONOSCOPY     ESOPHAGOGASTRODUODENOSCOPY N/A 07/16/2017   Procedure: ESOPHAGOGASTRODUODENOSCOPY (EGD);  Surgeon: Ladene Artist, MD;  Location: [facility] ENDOSCOPY;  Service: Endoscopy;  Laterality: N/A;   ESOPHAGOGASTRODUODENOSCOPY N/A 10/22/2018   Procedure: ESOPHAGOGASTRODUODENOSCOPY (EGD);  Surgeon: Milus Banister, MD;  Location: [facility];  Service: Endoscopy;  Laterality: N/A;   ESOPHAGOGASTRODUODENOSCOPY (EGD) WITH PROPOFOL N/A 07/03/2018   Procedure: ESOPHAGOGASTRODUODENOSCOPY (EGD) WITH PROPOFOL;  Surgeon: Gatha Mayer, MD;  [facility];  Service: Endoscopy;  Laterality: N/A;   ESOPHAGOGASTRODUODENOSCOPY (EGD) WITH PROPOFOL N/A 09/02/2018   Procedure: ESOPHAGOGASTRODUODENOSCOPY (EGD) WITH PROPOFOL;  Surgeon: Gatha Mayer, MD;  [facility];  Service: Endoscopy;  Laterality: N/A;   EUS N/A 10/22/2018   Procedure: UPPER ENDOSCOPIC ULTRASOUND (EUS) RADIAL;  Surgeon: Milus Banister, MD;  [facility];  Service: Endoscopy;  Laterality: N/A;   fibroid excision  age 46   prolapsed fibroid removed   FINE NEEDLE ASPIRATION  10/22/2018   Procedure: FINE NEEDLE ASPIRATION;  Surgeon: Milus Banister, MD;  [facility];  Service: Endoscopy;;   FOOT SURGERY Left 2014   Dr. Paulla Dolly   FOOT SURGERY Right 08/2016   Dr. Paulla Dolly --Shortening Osteotomy with Fixation 1st Metatarsal    HAMMER  TOE SURGERY Right 12/04/2021   4th toe; Dr. Paulla Dolly   MOUTH SURGERY  06/2017   CYST    PORT-A-CATH REMOVAL N/A 02/15/2016   Procedure: REMOVAL PORT-A-CATH;  Surgeon: Autumn Messing III, MD;  Location: [place];  Service: General;  Laterality: N/A;   RADIOACTIVE SEED GUIDED PARTIAL MASTECTOMY WITH AXILLARY SENTINEL LYMPH NODE BIOPSY Left 05/25/2015   Procedure: LEFT BREAST LUMPECTOMY WITH RADIOACTIVE SEED AND SENTINEL LYMPH NODE BIOPSY;  Surgeon: Autumn Messing III, MD;  Location: [place];  Service: General;  Laterality: Left;   UPPER GASTROINTESTINAL ENDOSCOPY     Patient Active Problem List   Diagnosis Date Noted   Cognitive deficits 05/16/2020   Visual disturbance 05/16/2020   Sleep disturbance 04/04/2020   Post concussive syndrome 03/02/2020   Dizziness and giddiness 05/27/4817   Follicular lymphoma ([place]) 11/16/2018   Duodenum disorder    Upper gastrointestinal bleed 07/02/2018   Abdominal pain, epigastric 07/01/2018   Elevated lipase 07/01/2018   Elevated LFTs 07/01/2018   Iron deficiency anemia 08/17/2017   C. 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   Allergy to adhesive tape 01/25/2015   Malignant neoplasm of upper-outer quadrant of left breast in female, estrogen receptor negative ([place]) 12/22/2014    ONSET DATE: 05/06/2022   REFERRING DIAG: R41.89 (ICD-10-CM) - Cognitive deficits Z87.81 (ICD-10-CM) - History of skull fracture R26.89 (ICD-10-CM) - Balance problem   THERAPY DIAG:  Unsteadiness on feet  Rationale for Evaluation and Treatment Rehabilitation  SUBJECTIVE:   SUBJECTIVE STATEMENT: Pt reports her balance is improving - states the therapy is really helping; pt reports she went to her class reunion this past weekend and was able to do some dancing with her husband   Pt accompanied by: self  PERTINENT HISTORY:  Bipolar disorder, osteoporosis, history of breast cancer, history of lymphoma, history of skull fracture  PAIN:  Are you having pain? No  There were no  vitals filed for this visit.   Sitting and standing.   PRECAUTIONS: Fall   PATIENT GOALS "be less dizzy and improve my balance" "get my life back"   OBJECTIVE:   VESTIBULAR TREATMENT:  Gaze Adaptation:   X1 viewing in standing 5' away from target - pt stood on Airex for increased challenge with balance; target on plain background - horizontal 30 secs x 1 rep; vertical 30 secs x 1 rep;  then performed 2nd rep horizontally 60 secs x 1 rep and then vertically 60 secs x 1 rep;  pt reported feeling mild "wooziness" after 2nd rep of 60 secs vertical direction but able to continue with exercises with no seated rest break needed   Standing Balance: Marching on Airex and also on blue mat on floor - EO 10 reps:  pt performed marching with horizontal head turns 5 reps with EO:  marching with vertical head turns 5 reps with EO  Pt amb. 30' x 1 rep with EC with SBA   Rockerboard - anterior/posteriorly EO 10 reps; with UE support EC 10 reps: with 1 finger support  on each hand 10 reps with EC; pt held board steady with no intentional rocking - performed head turns horizontally 5 reps and then 5 reps vertical with targets on either side for improved gaze stabilization  Pt performed marching forward/backward on blue mat on floor    Pt performed amb. Approx. 35' x 2 reps making circles clockwise with ball with visual tracking and then counterclockwise 35' x 2 reps with visual tracking for improved gaze stabilization  Pt performed activity of reaching down to floor to pick up 3# kettlebell (standing on blue mat for compliant surface training) - picking up and turning 180 degrees 3 reps - no dizziness reported with this activity Pt then performed 360 degree turn on blue mat after bending down to retrieve kettlebell 3 reps with CGA to SBA for safety, however, pt had no LOB and only mild dizziness with this activity  Standing Balance: Surface: Airex inside // bars to have UE support prn Position: Feet  Hip Width Apart Completed with: Eyes Closed; Head Turns x 10 Reps and Head Nods x 10 Reps          PATIENT EDUCATION  Education details: 07-01-22:  progressed x1 viewing to standing on compliant surface and also progressed standing on foam exercises at home from feet apart to feet CLOSER together, but not necessarily touching:  discussed that trekking poles will probably NOT be needed due to progress achieved with balance as of current time - pt agrees Person educated: Patient Education method: Explanation, Demonstration, and Verbal cues Education comprehension: verbalized understanding and returned demonstration  HEP:  Standing VOR x1 60 seconds, walking forwards with EC at [place] [id number]  HEP updated on 06-24-22:  [employer] [id number]   GOALS: Goals reviewed with patient? Yes  SHORT TERM GOALS: Target date: 06/24/2022   Initial HEP Baseline: Goal status: Goal met 06-24-22  2.  Pt will improve score on DHI by 5 points to demonstrate decreased disability level Baseline: 46 points (8/21) Goal status: Ongoing - 06-24-22 - will reassess at D/C  3.  Pt will improve score on m-CTSIB by 5 sec to demonstrate improved balance Baseline: 96/120 (8/21) :   91/120 on 06-24-22 Goal status: Goal not met 06-24-22:    4.  Pt will undergo further assessment of FGA with LTG written. Baseline: 22/30 ; score 24/30 on 06-24-22 Goal status: MET   LONG TERM GOALS: Target date: 07/15/2022    Pt will be independent with final HEP for improved strength, balance, transfers and gait.  Baseline:  Goal status: INITIAL  2.  Pt will improve score on DHI by 10 points to demonstrate decreased disability level Baseline: 46 points Goal status: INITIAL  3.  Pt will improve score on m-CTSIB by 10 points to demonstrate improved balance Baseline: 96/120 (8/21) Goal status: INITIAL  4.  Pt will improve DVA to a 3 line or less difference in order to demo improved VOR.  Baseline: 5 line difference  Goal  status: INITIAL  5.  Pt will perform MSQ items and rate 0-1/5 dizziness in order to demo improved motion sensitivity.  Baseline: 2-3 dizziness  Goal status: INITIAL  6.  Pt will improve FGA to at least a 26/30 in order to demo decr fall risk.  Baseline:  Goal status: INITIAL  ASSESSMENT:  CLINICAL IMPRESSION: PT session focused on balance/vestibular exercises with progression of gaze stabilization exercise to standing on compliant surface.  Pt reported slight feeling of wooziness after performing vertical x 1 viewing for 60  secs but able to continue with exercises without need for seated rest break.  Pt tolerated exercises well with no c/o increased dizziness during session.  Pt reported feeling that her balance is improving and feels she is doing much better.   Will continue to progress towards LTGs.    OBJECTIVE IMPAIRMENTS Abnormal gait, decreased balance, decreased knowledge of condition, decreased mobility, dizziness, impaired perceived functional ability, and impaired vision/preception.   ACTIVITY LIMITATIONS carrying, lifting, bending, squatting, stairs, transfers, bathing, and hygiene/grooming  PARTICIPATION LIMITATIONS: meal prep, cleaning, laundry, medication management, personal finances, interpersonal relationship, driving, shopping, and community activity  PERSONAL FACTORS Past/current experiences, Time since onset of injury/illness/exacerbation, and 1-2 comorbidities:    Bipolar disorder, osteoporosis, history of breast cancer, history of lymphoma, history of skull fractureare also affecting patient's functional outcome.   REHAB POTENTIAL: Good  CLINICAL DECISION MAKING: Stable/uncomplicated  EVALUATION COMPLEXITY: Moderate   PLAN: PT FREQUENCY: 2x/week  PT DURATION: 6 weeks  PLANNED INTERVENTIONS: Therapeutic exercises, Therapeutic activity, Neuromuscular re-education, Balance training, Gait training, Patient/Family education, Self Care, Joint mobilization, Stair  training, Vestibular training, Canalith repositioning, Visual/preceptual remediation/compensation, DME instructions, Cryotherapy, Moist heat, Manual therapy, and Re-evaluation  PLAN FOR NEXT SESSION:    Cont to Work on motion sensitivities, progress VOR, balance with head motions, EC, unlevel surfaces, narrow BOS, turning.     Alda Lea, PT 07/01/2022, 6:36 PM

## 2022-07-02 ENCOUNTER — Ambulatory Visit: Payer: Medicare Other | Admitting: Diagnostic Neuroimaging

## 2022-07-04 ENCOUNTER — Encounter: Payer: Self-pay | Admitting: Physical Therapy

## 2022-07-04 ENCOUNTER — Ambulatory Visit: Payer: Medicare Other | Admitting: Physical Therapy

## 2022-07-04 DIAGNOSIS — R42 Dizziness and giddiness: Secondary | ICD-10-CM

## 2022-07-04 DIAGNOSIS — R2689 Other abnormalities of gait and mobility: Secondary | ICD-10-CM

## 2022-07-04 DIAGNOSIS — R2681 Unsteadiness on feet: Secondary | ICD-10-CM

## 2022-07-04 NOTE — Therapy (Signed)
OUTPATIENT PHYSICAL THERAPY VESTIBULAR TREATMENT     Patient Name: Rhonda Gardner MRN: [id number] DOB:1956/03/04, 66 y.o., female Today's Date: 07/04/2022  PCP: Rita Ohara, MD  REFERRING PROVIDER: Rita Ohara, MD    PT End of Session - 07/04/22 0940     Visit Number 9    Number of Visits 13   with eval   Date for PT Re-Evaluation 07/29/22   to allow for scheduling conflicts   Authorization Type Medicare    Progress Note Due on Visit 10    PT Start Time [phone]   pt late to session   PT Stop Time 1015    PT Time Calculation (min) 37 min    Equipment Utilized During Treatment Gait belt    Activity Tolerance Patient tolerated treatment well    Behavior During Therapy [facility] for tasks assessed/performed                Past Medical History:  Diagnosis Date   Allergy 2006   chemical cauterization in spring 2006, and 2nd treatment with good results.(Dr.Shoemaker)   Bipolar disorder ([place])    Bleeding ulcer    Blood transfusion without reported diagnosis 07/2017   Bleeding ulcer   Breast cancer ([place]) 12/2014   invasive ductal carcinoma (LEFT)   Breast cancer of upper-outer quadrant of left female breast ([place]) 12/22/2014   Breast hematoma 12/19/2015   had left breast aspiration of 4cm hematoma-path indicates benign hematoma   C. difficile colitis    C. 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)   Osteoporosis 06/20/2016   T-2.5_0  on Dexa ([place])   Plantar fasciitis    PONV (postoperative nausea and vomiting)    Symptomatic anemia 07/16/2017   Past Surgical History:  Procedure Laterality Date   BIOPSY  07/03/2018   Procedure: BIOPSY;  Surgeon: Gatha Mayer, MD;  Location: [facility];  Service: Endoscopy;;   BIOPSY  09/02/2018   Procedure: BIOPSY;  Surgeon: Gatha Mayer, MD;  [facility];  Service: Endoscopy;;   BIOPSY  10/22/2018   Procedure: BIOPSY;  Surgeon: Milus Banister, MD;  [facility];  Service: Endoscopy;;   BREAST BIOPSY Bilateral    benign and a right stereotatic breast biopsy.   COLONOSCOPY     ESOPHAGOGASTRODUODENOSCOPY N/A 07/16/2017   Procedure: ESOPHAGOGASTRODUODENOSCOPY (EGD);  Surgeon: Ladene Artist, MD;  Location: [facility]OSCOPY;  Service: Endoscopy;  Laterality: N/A;   ESOPHAGOGASTRODUODENOSCOPY N/A 10/22/2018   Procedure: ESOPHAGOGASTRODUODENOSCOPY (EGD);  Surgeon: Milus Banister, MD;  Location: [facility];  Service: Endoscopy;  Laterality: N/A;   ESOPHAGOGASTRODUODENOSCOPY (EGD) WITH PROPOFOL N/A 07/03/2018   Procedure: ESOPHAGOGASTRODUODENOSCOPY (EGD) WITH PROPOFOL;  Surgeon: Gatha Mayer, MD;  [facility];  Service: Endoscopy;  Laterality: N/A;   ESOPHAGOGASTRODUODENOSCOPY (EGD) WITH PROPOFOL N/A 09/02/2018   Procedure: ESOPHAGOGASTRODUODENOSCOPY (EGD) WITH PROPOFOL;  Surgeon: Gatha Mayer, MD;  [facility];  Service: Endoscopy;  Laterality: N/A;   EUS N/A 10/22/2018   Procedure: UPPER ENDOSCOPIC ULTRASOUND (EUS) RADIAL;  Surgeon: Milus Banister, MD;  [facility];  Service: Endoscopy;  Laterality: N/A;   fibroid excision  age 71   prolapsed fibroid removed   FINE NEEDLE ASPIRATION  10/22/2018   Procedure: FINE NEEDLE ASPIRATION;  Surgeon: Milus Banister, MD;  [facility];  Service: Endoscopy;;   FOOT SURGERY Left 2014   Dr. Paulla Dolly   FOOT SURGERY Right 08/2016  Dr. Regal --Shortening Osteotomy with Fixation 1st Metatarsal    HAMMER TOE SURGERY Right 12/04/2021   4th toe; Dr. Regal   MOUTH SURGERY  06/2017   CYST    PORT-A-CATH REMOVAL N/A 02/15/2016   Procedure: REMOVAL PORT-A-CATH;  Surgeon: Paul Toth III, MD;  Location: [facility];  Service: General;  Laterality: N/A;   RADIOACTIVE SEED GUIDED PARTIAL MASTECTOMY WITH AXILLARY SENTINEL LYMPH NODE BIOPSY Left 05/25/2015   Procedure: LEFT BREAST LUMPECTOMY WITH RADIOACTIVE SEED AND SENTINEL  LYMPH NODE BIOPSY;  Surgeon: Paul Toth III, MD;  Location: [facility];  Service: General;  Laterality: Left;   UPPER GASTROINTESTINAL ENDOSCOPY     Patient Active Problem List   Diagnosis Date Noted   Cognitive deficits 05/16/2020   Visual disturbance 05/16/2020   Sleep disturbance 04/04/2020   Post concussive syndrome 03/02/2020   Dizziness and giddiness 03/02/2020   Follicular lymphoma (HCC) 11/16/2018   Duodenum disorder    Upper gastrointestinal bleed 07/02/2018   Abdominal pain, epigastric 07/01/2018   Elevated lipase 07/01/2018   Elevated LFTs 07/01/2018   Iron deficiency anemia 08/17/2017   C. 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 01/25/2015   Malignant neoplasm of upper-outer quadrant of left breast in female, estrogen receptor negative (HCC) 12/22/2014    ONSET DATE: 05/06/2022   REFERRING DIAG: R41.89 (ICD-10-CM) - Cognitive deficits Z87.81 (ICD-10-CM) - History of skull fracture R26.89 (ICD-10-CM) - Balance problem   THERAPY DIAG:  Unsteadiness on feet  Dizziness and giddiness  Other abnormalities of gait and mobility  Rationale for Evaluation and Treatment Rehabilitation  SUBJECTIVE:   SUBJECTIVE STATEMENT: Pt reports her balance is continuing to improve and she is doing better. Even her husband has noticed she is doing better. Tried to walk down her driveway and turn her head but had some unsteadiness, so she stopped doing it.    Pt accompanied by:  self  PERTINENT HISTORY:  Bipolar disorder, osteoporosis, history of breast cancer, history of lymphoma, history of skull fracture  PAIN:  Are you having pain? No  There were no vitals filed for this visit.   Sitting and standing.   PRECAUTIONS: Fall   PATIENT GOALS "be less dizzy and improve my balance" "get my life back"   OBJECTIVE:   VESTIBULAR TREATMENT: Gaze Adaptation:                       x1 Viewing Horizontal: Position: Standing Feet Apart > together with busy background, Time: 60, Reps: 2, and Comment: pt reporting mild dizziness and slightly more dizziness with feet together  and x1 Viewing Vertical:  Position: Standing Feet Apart > together with busy background, Time: 60, Reps: 2, and Comment: pt reporting mild dizziness and slightly more dizziness with feet together. And slightly wobbly.    Standing Balance:  Forward gait outdoors over grass 400' with mild unsteadiness. Then performed gait with head turns 2 x 25' and gait with head nods 2 x 25' with close min   guard for balance. Pt more unsteady with head turns. Pt reporting 8/10 dizziness afterwards (baseline 5/10).  Rockerboard - in M/L direction, EC 3 x 30 seconds trying to keep board steady with intermittent taps to wall for balance. Pt improving with incr reps. Keeping board steady EO 2 x 10 reps head turns, 2 x 10 reps head nods. Intermittent taps for balance. Pt more challenged by horizontal head motions.  3/4 tandem > tandem stance on level ground, performing ball toss with 2nd PT for visual tracking/balance and primary PT providing min guard for balance, performed x15 reps ball toss each leg, pt with mild postural sway. Min guard for balance and intermittent taps to walls.        PATIENT EDUCATION  Education details: Continue with HEP. Could try standing VOR with a busy background on level ground for addition to HEP.  Person educated: Patient Education method: Explanation Education comprehension:  verbalized understanding and returned demonstration  HEP:  Standing VOR x1 60 seconds, walking forwards with EC at countertop [id number]  HEP updated on 06-24-22:  [employer] [email or web address]   GOALS: Goals reviewed with patient? Yes  SHORT TERM GOALS: Target date: 06/24/2022   Initial HEP Baseline: Goal status: Goal met 06-24-22  2.  Pt will improve score on DHI by 5 points to demonstrate decreased disability level Baseline: 46 points (8/21) Goal status: Ongoing - 06-24-22 - will reassess at D/C  3.  Pt will improve score on m-CTSIB by 5 sec to demonstrate improved balance Baseline: 96/120 (8/21) :   91/120 on 06-24-22 Goal status: Goal not met 06-24-22:    4.  Pt will undergo further assessment of FGA with LTG written. Baseline: 22/30 ; score 24/30 on 06-24-22 Goal status: MET   LONG TERM GOALS: Target date: 07/15/2022    Pt will be independent with final HEP for improved strength, balance, transfers and gait.  Baseline:  Goal status: INITIAL  2.  Pt will improve score on DHI by 10 points to demonstrate decreased disability level Baseline: 46 points Goal status: INITIAL  3.  Pt will improve score on m-CTSIB by 10 points to demonstrate improved balance Baseline: 96/120 (8/21) Goal status: INITIAL  4.  Pt will improve DVA to a 3 line or less difference in order to demo improved VOR.  Baseline: 5 line difference  Goal status: INITIAL  5.  Pt will perform MSQ items and rate 0-1/5 dizziness in order to demo improved motion sensitivity.  Baseline: 2-3 dizziness  Goal status: INITIAL  6.  Pt will improve FGA to at least a 26/30 in order to demo decr fall risk.  Baseline:  Goal status: INITIAL  ASSESSMENT:  CLINICAL IMPRESSION: Pt late to session today, so session was limited. Today's skilled session continued to focus on progression of VOR exercises and vestibular exercises for balance.  Able to progress VOR on level ground with a busy background with pt reporting mild/moderate  dizziness afterwards. Worked on gait with head motions outdoors on grass with pt needing close min guard due to being off balance. Pt more challenged with head turns. Will continue to progress towards LTGs.    OBJECTIVE IMPAIRMENTS Abnormal gait, decreased balance, decreased knowledge of condition, decreased mobility, dizziness, impaired perceived functional ability, and impaired vision/preception.   ACTIVITY LIMITATIONS carrying, lifting, bending, squatting, stairs, transfers, bathing, and hygiene/grooming  PARTICIPATION LIMITATIONS: meal prep, cleaning, laundry, medication management, personal finances, interpersonal relationship, driving, shopping, and community activity  PERSONAL FACTORS Past/current experiences, Time since onset of injury/illness/exacerbation,   and 1-2 comorbidities:    Bipolar disorder, osteoporosis, history of breast cancer, history of lymphoma, history of skull fractureare also affecting patient's functional outcome.   REHAB POTENTIAL: Good  CLINICAL DECISION MAKING: Stable/uncomplicated  EVALUATION COMPLEXITY: Moderate   PLAN: PT FREQUENCY: 2x/week  PT DURATION: 6 weeks  PLANNED INTERVENTIONS: Therapeutic exercises, Therapeutic activity, Neuromuscular re-education, Balance training, Gait training, Patient/Family education, Self Care, Joint mobilization, Stair training, Vestibular training, Canalith repositioning, Visual/preceptual remediation/compensation, DME instructions, Cryotherapy, Moist heat, Manual therapy, and Re-evaluation  PLAN FOR NEXT SESSION:    will need 10th visit PN. Cont to Work on motion sensitivities, progress VOR, balance with head motions, EC, unlevel surfaces, narrow BOS, turning.     Arliss Journey, PT 07/04/2022, 10:18 AM

## 2022-07-08 ENCOUNTER — Ambulatory Visit: Payer: Medicare Other | Admitting: Physical Therapy

## 2022-07-08 ENCOUNTER — Encounter: Payer: Self-pay | Admitting: Physical Therapy

## 2022-07-08 DIAGNOSIS — R42 Dizziness and giddiness: Secondary | ICD-10-CM

## 2022-07-08 DIAGNOSIS — R2681 Unsteadiness on feet: Secondary | ICD-10-CM

## 2022-07-08 NOTE — Therapy (Signed)
OUTPATIENT PHYSICAL THERAPY VESTIBULAR TREATMENT/10th VISIT PROGRESS NOTE    Progress Note   Reporting Period 06-03-22 to 07-08-22  See note below for Objective Data and Assessment of Progress/Goals.      Patient Name: Rhonda Gardner MRN: [id number] DOB:[birth year], 66 y.o., female Today's Date: 07/08/2022  PCP: Rita Ohara, MD  REFERRING PROVIDER: Rita Ohara, MD    PT End of Session - 07/08/22 1136     Visit Number 10    Number of Visits 13   with eval   Date for PT Re-Evaluation 07/29/22   to allow for scheduling conflicts   Authorization Type Medicare    Progress Note Due on Visit 10    PT Start Time 0936    PT Stop Time 1018    PT Time Calculation (min) 42 min    Equipment Utilized During Treatment Gait belt    Activity Tolerance Patient tolerated treatment well    Behavior During Therapy [facility] for tasks assessed/performed                 Past Medical History:  Diagnosis Date   Allergy 2006   chemical cauterization in spring 2006, and 2nd treatment with good results.(Dr.Shoemaker)   Bipolar disorder ([place])    Bleeding ulcer    Blood transfusion without reported diagnosis 07/2017   Bleeding ulcer   Breast cancer ([place]) 12/2014   invasive ductal carcinoma (LEFT)   Breast cancer of upper-outer quadrant of left female breast ([place]) 12/22/2014   Breast hematoma 12/19/2015   had left breast aspiration of 4cm hematoma-path indicates benign hematoma   C. difficile colitis    C. 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, treated at [facility] (radiation)   Osteoporosis 06/20/2016   T-2.5'@spine'  on Dexa ([place])   Plantar fasciitis    PONV (postoperative nausea and vomiting)    Symptomatic anemia 07/16/2017   Past Surgical History:  Procedure Laterality Date   BIOPSY  07/03/2018   Procedure: BIOPSY;  Surgeon: Gatha Mayer, MD;  [facility];  Service: Endoscopy;;   BIOPSY  09/02/2018   Procedure: BIOPSY;   Surgeon: Gatha Mayer, MD;  [facility];  Service: Endoscopy;;   BIOPSY  10/22/2018   Procedure: BIOPSY;  Surgeon: Milus Banister, MD;  [facility];  Service: Endoscopy;;   BREAST BIOPSY Bilateral    benign and a right stereotatic breast biopsy.   COLONOSCOPY     ESOPHAGOGASTRODUODENOSCOPY N/A 07/16/2017   Procedure: ESOPHAGOGASTRODUODENOSCOPY (EGD);  Surgeon: Ladene Artist, MD;  [facility] END[facility];  Service: Endoscopy;  Laterality: N/A;   ESOPHAGOGASTRODUODENOSCOPY N/A 10/22/2018   Procedure: ESOPHAGOGASTRODUODENOSCOPY (EGD);  Surgeon: Milus Banister, MD;  Location: [facility];  Service: Endoscopy;  Laterality: N/A;   ESOPHAGOGASTRODUODENOSCOPY (EGD) WITH PROPOFOL N/A 07/03/2018   Procedure: ESOPHAGOGASTRODUODENOSCOPY (EGD) WITH PROPOFOL;  Surgeon: Gatha Mayer, MD;  [facility];  Service: Endoscopy;  Laterality: N/A;   ESOPHAGOGASTRODUODENOSCOPY (EGD) WITH PROPOFOL N/A 09/02/2018   Procedure: ESOPHAGOGASTRODUODENOSCOPY (EGD) WITH PROPOFOL;  Surgeon: Gatha Mayer, MD;  [facility];  Service: Endoscopy;  Laterality: N/A;   EUS N/A 10/22/2018   Procedure: UPPER ENDOSCOPIC ULTRASOUND (EUS) RADIAL;  Surgeon: Milus Banister, MD;  [facility];  Service: Endoscopy;  Laterality: N/A;   fibroid excision  age 45   prolapsed fibroid removed   FINE NEEDLE ASPIRATION  10/22/2018   Procedure: FINE NEEDLE ASPIRATION;  Surgeon: Milus Banister, MD;  [facility];  Service: Endoscopy;;   FOOT SURGERY Left 2014   Dr. Paulla Dolly   FOOT SURGERY Right 08/2016   Dr. Paulla Dolly --Shortening Osteotomy with Fixation 1st Metatarsal    HAMMER TOE SURGERY Right 12/04/2021   4th toe; Dr. Paulla Dolly   MOUTH SURGERY  06/2017   CYST    PORT-A-CATH REMOVAL N/A 02/15/2016   Procedure: REMOVAL PORT-A-CATH;  Surgeon: Autumn Messing III, MD;  Location: [facility];  Service: General;  Laterality: N/A;   RADIOACTIVE SEED GUIDED PARTIAL  MASTECTOMY WITH AXILLARY SENTINEL LYMPH NODE BIOPSY Left 05/25/2015   Procedure: LEFT BREAST LUMPECTOMY WITH RADIOACTIVE SEED AND SENTINEL LYMPH NODE BIOPSY;  Surgeon: Autumn Messing III, MD;  Location: [facility];  Service: General;  Laterality: Left;   UPPER GASTROINTESTINAL ENDOSCOPY     Patient Active Problem List   Diagnosis Date Noted   Cognitive deficits 05/16/2020   Visual disturbance 05/16/2020   Sleep disturbance 04/04/2020   Post concussive syndrome 03/02/2020   Dizziness and giddiness 16/07/9603   Follicular lymphoma ([place]) 11/16/2018   Duodenum disorder    Upper gastrointestinal bleed 07/02/2018   Abdominal pain, epigastric 07/01/2018   Elevated lipase 07/01/2018   Elevated LFTs 07/01/2018   Iron deficiency anemia 08/17/2017   C. 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   Allergy to adhesive tape 01/25/2015   Malignant neoplasm of upper-outer quadrant of left breast in female, estrogen receptor negative ([place]) 12/22/2014    ONSET DATE: 05/06/2022   REFERRING DIAG: R41.89 (ICD-10-CM) - Cognitive deficits Z87.81 (ICD-10-CM) - History of skull fracture R26.89 (ICD-10-CM) - Balance problem   THERAPY DIAG:  Unsteadiness on feet  Dizziness and giddiness  Rationale for Evaluation and Treatment Rehabilitation  SUBJECTIVE:   SUBJECTIVE STATEMENT: Pt reports her balance continues to improve - reports she is doing HEP and walking regularly at home; went to [facility] and saw [place] last week; pt agrees  that she will be ready for discharge next week   Pt accompanied by: self  PERTINENT HISTORY:  Bipolar disorder, osteoporosis, history of breast cancer, history of lymphoma, history of skull fracture  PAIN:  Are you having pain? No  There were no vitals filed for this visit.   Sitting and standing.   PRECAUTIONS: Fall   PATIENT GOALS "be less dizzy and improve my balance" "get my life back"   OBJECTIVE:   VESTIBULAR TREATMENT:  Standing Balance: Pt performed standing and marching on blue mat on incline - marching with EO with eyes on specific target outside window 5 reps: marching with EC 5 reps with no head turns; marching with EO head turns horizontally 5 reps and then vertically 5 reps; marching with EC with horizontal head turns 5 reps, vertical head turns EC 5 reps with marching with CGA to min assist for recovery of LOB prn  Performed same sequence of these exercises on the decline on ramp - standing on blue mat for compliant surface training  Pt then performed alternate stepping up and back on incline with head turn  to opposite side 5 reps; then standing on decline down/back with head turns horizontal and vertical 5 reps with EO  Pt amb. On ramp (on blue mat) 2 reps with EC - forwards/backwards on decline/incline Th  Pt amb. Approx. 30' making circles clockwise with ball for improved gaze stabilization, counterclockwise, letter "S" pattern and "+" pattern Pt had no LOB and no dizziness with this activity  Amb. 30' x 2 reps forward with EC;   EO - amb 25' with abrupt stop and turn x 2 reps to Rt side, 2 reps to Lt side with CGA; pt used stepping strategy for recovery of LOB  Standing on rockerboard inside // bars - 5 reps EO; 10 reps with EC without UE support; standing on board  - holding it steady - head turns horizontal 5 reps x 2 sets; vertical 5 reps x 2 sets EO and then EC  Pt stood on 2 pillows in corner; feet together - EO and EC with horizontal and vertical  head turns 5 reps each Standing on pillows - trunk rotation EO straight across, then diagonal turns "X" EO and then EC 5 reps each direction     PATIENT EDUCATION  Education details: Continue with HEP- progressed standing on pillow exercises to feet together Person educated: Patient Education method: Explanation Education comprehension: verbalized understanding and returned demonstration  HEP:  Standing VOR x1 60 seconds, walking forwards with EC at countertop [id number]  HEP updated on 06-24-22:  [employer] [email or web address]   GOALS: Goals reviewed with patient? Yes  SHORT TERM GOALS: Target date: 06/24/2022   Initial HEP Baseline: Goal status: Goal met 06-24-22  2.  Pt will improve score on DHI by 5 points to demonstrate decreased disability level Baseline: 46 points (8/21) Goal status: Ongoing - 06-24-22 - will reassess at D/C  3.  Pt will improve score on m-CTSIB by 5 sec to demonstrate improved balance Baseline: 96/120 (8/21) :   91/120 on 06-24-22 Goal status: Goal not met 06-24-22:    4.  Pt will undergo further assessment of FGA with LTG written. Baseline: 22/30 ; score 24/30 on 06-24-22 Goal status: MET   LONG TERM GOALS: Target date: 07/15/2022    Pt will be independent with final HEP for improved strength, balance, transfers and gait.  Baseline:  Goal status: INITIAL  2.  Pt will improve score on DHI by 10 points to demonstrate decreased disability level Baseline: 46 points Goal status: INITIAL  3.  Pt will improve score on m-CTSIB by 10 points to demonstrate improved balance Baseline: 96/120 (8/21) Goal status: INITIAL  4.  Pt will improve DVA to a 3 line or less difference in order to demo improved VOR.  Baseline: 5 line difference  Goal status: INITIAL  5.  Pt will perform MSQ items and rate 0-1/5 dizziness in order to demo improved motion sensitivity.  Baseline: 2-3 dizziness  Goal status: INITIAL  6.  Pt will improve FGA to at least a 26/30 in order to demo  decr fall risk.  Baseline:  Goal status: INITIAL  ASSESSMENT:  CLINICAL IMPRESSION: This 10th visit progress note covers dates 06-03-22 - 07-08-22; pt has met STG's #1, 3 and 4 with STG #2 ongoing.  Pt is progressing well; she continues to be most challenged by activities with eyes closed, especially combined with standing on compliant surfaces, indicative of decreased vestibular input in maintaining balance.  Pt able to recover LOB during session with occasional min assist provided with activity of standing  on incline on blue mat.  Cont to work toward LTG's with plan for D/C next week.    OBJECTIVE IMPAIRMENTS Abnormal gait, decreased balance, decreased knowledge of condition, decreased mobility, dizziness, impaired perceived functional ability, and impaired vision/preception.   ACTIVITY LIMITATIONS carrying, lifting, bending, squatting, stairs, transfers, bathing, and hygiene/grooming  PARTICIPATION LIMITATIONS: meal prep, cleaning, laundry, medication management, personal finances, interpersonal relationship, driving, shopping, and community activity  PERSONAL FACTORS Past/current experiences, Time since onset of injury/illness/exacerbation, and 1-2 comorbidities:    Bipolar disorder, osteoporosis, history of breast cancer, history of lymphoma, history of skull fractureare also affecting patient's functional outcome.   REHAB POTENTIAL: Good  CLINICAL DECISION MAKING: Stable/uncomplicated  EVALUATION COMPLEXITY: Moderate   PLAN: PT FREQUENCY: 2x/week  PT DURATION: 6 weeks  PLANNED INTERVENTIONS: Therapeutic exercises, Therapeutic activity, Neuromuscular re-education, Balance training, Gait training, Patient/Family education, Self Care, Joint mobilization, Stair training, Vestibular training, Canalith repositioning, Visual/preceptual remediation/compensation, DME instructions, Cryotherapy, Moist heat, Manual therapy, and Re-evaluation  PLAN FOR NEXT SESSION:    try Bosu for compliant  surface training - Cont to Work on motion sensitivities, progress VOR, balance with head motions, EC, unlevel surfaces, narrow BOS, turning.     Alda Lea, PT 07/08/2022, 11:39 AM

## 2022-07-10 ENCOUNTER — Ambulatory Visit: Payer: Medicare Other | Admitting: Physical Therapy

## 2022-07-10 ENCOUNTER — Ambulatory Visit (INDEPENDENT_AMBULATORY_CARE_PROVIDER_SITE_OTHER): Payer: Medicare Other | Admitting: Diagnostic Neuroimaging

## 2022-07-10 ENCOUNTER — Encounter: Payer: Self-pay | Admitting: Physical Therapy

## 2022-07-10 ENCOUNTER — Encounter: Payer: Self-pay | Admitting: Diagnostic Neuroimaging

## 2022-07-10 VITALS — BP 101/61 | HR 75 | Ht 67.0 in | Wt 141.5 lb

## 2022-07-10 DIAGNOSIS — R42 Dizziness and giddiness: Secondary | ICD-10-CM

## 2022-07-10 DIAGNOSIS — R2681 Unsteadiness on feet: Secondary | ICD-10-CM

## 2022-07-10 DIAGNOSIS — R5383 Other fatigue: Secondary | ICD-10-CM

## 2022-07-10 DIAGNOSIS — R2689 Other abnormalities of gait and mobility: Secondary | ICD-10-CM

## 2022-07-10 DIAGNOSIS — R269 Unspecified abnormalities of gait and mobility: Secondary | ICD-10-CM | POA: Diagnosis not present

## 2022-07-10 NOTE — Progress Notes (Signed)
[facility]  PATIENT: Rhonda Gardner DOB: Nov 13, 1955  REFERRING CLINICIAN: Rita Ohara, MD HISTORY FROM: patient REASON FOR VISIT: new consult   HISTORICAL  CHIEF COMPLAINT:  Chief Complaint  Patient presents with   New Patient (Initial Visit)    Pt reports feeling okay.She states she has been experiencing dizziness.  She states physical therapy has been helping her with her condition. Room 6 with husband.    HISTORY OF PRESENT ILLNESS:   66 year old female here for evaluation of dizziness, fatigue, balance difficulty.  Around 2015 patient started having malaise and fatigue, when she was diagnosed and treated for breast cancer.  In 2018 she also had diagnosis of lymphoma which was treated.  This caused significant stress and side effects.  In May 2020 she was on her driveway walking her dog when she was startled, turned around and fell down.  She hit her head on the concrete.  She went to the hospital was diagnosed with concussion and skull fracture.  In 2021 when she saw PM&R clinic for postconcussion syndrome.  Other contributing factors included insomnia, chemotherapy neuropathy, alcohol use and medication side effect.  Patient underwent some physical therapy and had some improvement.  Since that time symptoms have failed to improve.  They have gradually worsened.  They seem to suddenly worsen around 6 months ago.  Has returned to physical therapy and is noticing some improvement again.    REVIEW OF SYSTEMS: Full 14 system review of systems performed and negative with exception of: as per HPI.  ALLERGIES: Allergies  Allergen Reactions   Clindamycin/Lincomycin Diarrhea    Resulted in C-diff   Other Nausea Only    Stronger pain meds   Adhesive [Tape] Rash    Pt is sensitive to any kind of adhesive tape products.    HOME MEDICATIONS: Outpatient Medications Prior to Visit  Medication Sig Dispense Refill   Alpha-Lipoic Acid 600 MG TABS Take  by mouth.     Calcium-Magnesium (CALCIUM MAGNESIUM 750) 300-300 MG TABS Take by mouth. Bid     carboxymethylcellulose (REFRESH PLUS) 0.5 % SOLN Place 1 drop into both eyes 3 (three) times daily as needed (dry eyes.).     Cholecalciferol (VITAMIN D-3) 125 MCG (5000 UT) TABS Take 5,000 Units by mouth daily.     fluticasone (FLONASE) 50 MCG/ACT nasal spray Place 1 spray into both nostrils daily.     ibuprofen (ADVIL) 200 MG tablet Take 200 mg by mouth every 6 (six) hours as needed.     lamoTRIgine (LAMICTAL) 200 MG tablet Take 1/2 (one-half) tablet by mouth once daily 45 tablet 3   Lutein-Zeaxanthin 25-5 MG CAPS Take 1 capsule by mouth daily.     No facility-administered medications prior to visit.    PAST MEDICAL HISTORY: Past Medical History:  Diagnosis Date   Allergy 2006   chemical cauterization in spring 2006, and 2nd treatment with good results.(Dr.Shoemaker)   Bipolar disorder ([place])    Bleeding ulcer    Blood transfusion without reported diagnosis 07/2017   Bleeding ulcer   Breast cancer ([place]) 12/2014   invasive ductal carcinoma (LEFT)   Breast cancer of upper-outer quadrant of left female breast ([place]) 12/22/2014   Breast hematoma 12/19/2015   had left breast aspiration of 4cm hematoma-path indicates benign hematoma   C. difficile colitis    C. 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)   Osteoporosis 06/20/2016  T-2.5'@spine'$  on Dexa ([facility])   Plantar fasciitis    PONV (postoperative nausea and vomiting)    Symptomatic anemia 07/16/2017    PAST SURGICAL HISTORY: Past Surgical History:  Procedure Laterality Date   BIOPSY  07/03/2018   Procedure: BIOPSY;  Surgeon: Gatha Mayer, MD;  Location: [facility];  Service: Endoscopy;;   BIOPSY  09/02/2018   Procedure: BIOPSY;  Surgeon: Gatha Mayer, MD;  Location: [facility];  Service: Endoscopy;;   BIOPSY  10/22/2018   Procedure: BIOPSY;  Surgeon:  Milus Banister, MD;  Location: [facility];  Service: Endoscopy;;   BREAST BIOPSY Bilateral    benign and a right stereotatic breast biopsy.   COLONOSCOPY     ESOPHAGOGASTRODUODENOSCOPY N/A 07/16/2017   Procedure: ESOPHAGOGASTRODUODENOSCOPY (EGD);  Surgeon: Ladene Artist, MD;  Location: [facility];  Service: Endoscopy;  Laterality: N/A;   ESOPHAGOGASTRODUODENOSCOPY N/A 10/22/2018   Procedure: ESOPHAGOGASTRODUODENOSCOPY (EGD);  Surgeon: Milus Banister, MD;  Location: [facility];  Service: Endoscopy;  Laterality: N/A;   ESOPHAGOGASTRODUODENOSCOPY (EGD) WITH PROPOFOL N/A 07/03/2018   Procedure: ESOPHAGOGASTRODUODENOSCOPY (EGD) WITH PROPOFOL;  Surgeon: Gatha Mayer, MD;  Location: [facility];  Service: Endoscopy;  Laterality: N/A;   ESOPHAGOGASTRODUODENOSCOPY (EGD) WITH PROPOFOL N/A 09/02/2018   Procedure: ESOPHAGOGASTRODUODENOSCOPY (EGD) WITH PROPOFOL;  Surgeon: Gatha Mayer, MD;  Location: [facility];  Service: Endoscopy;  Laterality: N/A;   EUS N/A 10/22/2018   Procedure: UPPER ENDOSCOPIC ULTRASOUND (EUS) RADIAL;  Surgeon: Milus Banister, MD;  Location: [facility];  Service: Endoscopy;  Laterality: N/A;   fibroid excision  age 58   prolapsed fibroid removed   FINE NEEDLE ASPIRATION  10/22/2018   Procedure: FINE NEEDLE ASPIRATION;  Surgeon: Milus Banister, MD;  Location: [facility];  Service: Endoscopy;;   FOOT SURGERY Left 2014   Dr. Paulla Dolly   FOOT SURGERY Right 08/2016   Dr. Paulla Dolly --Shortening Osteotomy with Fixation 1st Metatarsal    HAMMER TOE SURGERY Right 12/04/2021   4th toe; Dr. Paulla Dolly   MOUTH SURGERY  06/2017   [facility]    PORT-A-CATH REMOVAL N/A 02/15/2016   Procedure: REMOVAL PORT-A-CATH;  Surgeon: Autumn Messing III, MD;  Location: [place];  Service: General;  Laterality: N/A;   RADIOACTIVE SEED GUIDED PARTIAL MASTECTOMY WITH AXILLARY SENTINEL LYMPH NODE BIOPSY Left 05/25/2015   Procedure: LEFT BREAST LUMPECTOMY WITH RADIOACTIVE SEED AND  SENTINEL LYMPH NODE BIOPSY;  Surgeon: Autumn Messing III, MD;  Location: [place];  Service: General;  Laterality: Left;   UPPER GASTROINTESTINAL ENDOSCOPY      FAMILY HISTORY: Family History  Problem Relation Age of Onset   Arthritis Mother        osteoarthritis   Macular degeneration Mother        wet and dry   Colon cancer Mother 46       colon cancer at age 60   Hypertension Mother        resolved with weight loss   Heart disease Father    Skin cancer Father        non melanoma - farmer   Osteoporosis Sister        osteopenia (states it resolved)   Mental retardation Sister    Blindness Sister        secondary to retrolental fibroplasia   Osteoporosis Sister    Breast cancer Sister 82       Paget's disease   Epilepsy Sister    Lung cancer Maternal Aunt  lung cancer   Cancer Paternal Aunt 71       ? stomach cancer   Goiter Paternal Aunt    Diabetes Paternal Aunt    Esophageal cancer Neg Hx    Rectal cancer Neg Hx    Stomach cancer Neg Hx     SOCIAL HISTORY: Social History   Socioeconomic History   Marital status: Married    Spouse name: Not on file   Number of children: Not on file   Years of education: Not on file   Highest education level: Not on file  Occupational History   Not on file  Tobacco Use   Smoking status: Never   Smokeless tobacco: Never  Vaping Use   Vaping Use: Never used  Substance and Sexual Activity   Alcohol use: Yes    Alcohol/week: 2.0 standard drinks of alcohol    Types: 2 Standard drinks or equivalent per week    Comment: 2 glasses of wine/week   Drug use: No   Sexual activity: Yes    Partners: Male    Comment: postmenopausal  Other Topics Concern   Not on file  Social History Narrative   Married Doctor, general practice) (1 dog (chocolate lab) passed away in Jan 22, 2019)    Homemaker      Updated 04/2022   Social Determinants of Health   Financial Resource Strain: Not on file  Food Insecurity: Not on file  Transportation  Needs: Not on file  Physical Activity: Not on file  Stress: Not on file  Social Connections: Not on file  Intimate Partner Violence: Not on file     PHYSICAL EXAM  GENERAL EXAM/CONSTITUTIONAL: Vitals:  Vitals:   07/10/22 1256  BP: 101/61  Pulse: 75  Weight: 141 lb 8 oz (64.2 kg)  Height: '5\' 7"'$  (1.702 m)   Body mass index is 22.16 kg/m. Wt Readings from Last 3 Encounters:  07/10/22 141 lb 8 oz (64.2 kg)  05/06/22 136 lb (61.7 kg)  04/30/21 135 lb 9.6 oz (61.5 kg)   Patient is in no distress; well developed, nourished and groomed; neck is supple  CARDIOVASCULAR: Examination of carotid arteries is normal; no carotid bruits Regular rate and rhythm, no murmurs Examination of peripheral vascular system by observation and palpation is normal  EYES: Ophthalmoscopic exam of optic discs and posterior segments is normal; no papilledema or hemorrhages No results found.  MUSCULOSKELETAL: Gait, strength, tone, movements noted in Neurologic exam below  NEUROLOGIC: MENTAL STATUS:     05/06/2022   10:01 AM  MMSE - Mini Mental State Exam  Orientation to time 5  Orientation to Place 5  Registration 3  Attention/ Calculation 5  Recall 3  Language- name 2 objects 2  Language- repeat 1  Language- follow 3 step command 3  Language- read & follow direction 1  Write a sentence 1  Copy design 1  Total score 30   awake, alert, oriented to person, place and time recent and remote memory intact normal attention and concentration language fluent, comprehension intact, naming intact fund of knowledge appropriate  CRANIAL NERVE:  2nd - no papilledema on fundoscopic exam 2nd, 3rd, 4th, 6th - pupils equal and reactive to light, visual fields full to confrontation, extraocular muscles intact, no nystagmus 5th - facial sensation symmetric 7th - facial strength symmetric 8th - hearing intact 9th - palate elevates symmetrically, uvula midline 11th - shoulder shrug symmetric 12th -  tongue protrusion midline  MOTOR:  normal bulk and tone, full strength in the BUE,  BLE  SENSORY:  normal and symmetric to light touch, temperature, vibration  COORDINATION:  finger-nose-finger, fine finger movements normal  REFLEXES:  deep tendon reflexes present and symmetric  GAIT/STATION:  narrow based gait     DIAGNOSTIC DATA (LABS, IMAGING, TESTING) - I reviewed patient records, labs, notes, testing and imaging myself where available.  Lab Results  Component Value Date   WBC 5.4 05/06/2022   HGB 14.0 05/06/2022   HCT 42.4 05/06/2022   MCV 93 05/06/2022   PLT 205 05/06/2022      Component Value Date/Time   NA 142 05/06/2022 1101   NA 140 09/12/2017 1045   K 4.2 05/06/2022 1101   K 4.3 09/12/2017 1045   CL 103 05/06/2022 1101   CO2 25 05/06/2022 1101   CO2 24 09/12/2017 1045   GLUCOSE 93 05/06/2022 1101   GLUCOSE 82 11/02/2019 1205   GLUCOSE 85 09/12/2017 1045   BUN 10 05/06/2022 1101   BUN 16.3 09/12/2017 1045   CREATININE 0.97 05/06/2022 1101   CREATININE 0.84 11/02/2019 1205   CREATININE 0.8 09/12/2017 1045   CALCIUM 10.1 05/06/2022 1101   CALCIUM 9.5 09/12/2017 1045   PROT 7.0 05/06/2022 1101   PROT 7.6 09/12/2017 1045   ALBUMIN 5.0 (H) 05/06/2022 1101   ALBUMIN 4.4 09/12/2017 1045   AST 17 05/06/2022 1101   AST 25 11/02/2019 1205   AST 21 09/12/2017 1045   ALT 15 05/06/2022 1101   ALT 24 11/02/2019 1205   ALT 17 09/12/2017 1045   ALKPHOS 61 05/06/2022 1101   ALKPHOS 67 09/12/2017 1045   BILITOT 0.4 05/06/2022 1101   BILITOT 0.4 11/02/2019 1205   BILITOT 0.26 09/12/2017 1045   GFRNONAA 67 04/27/2020 0946   GFRNONAA >60 11/02/2019 1205   GFRAA 78 04/27/2020 0946   GFRAA >60 11/02/2019 1205   Lab Results  Component Value Date   CHOL 212 (H) 04/27/2020   HDL 106 04/27/2020   LDLCALC 89 04/27/2020   TRIG 99 04/27/2020   CHOLHDL 2.0 04/27/2020   No results found for: "HGBA1C" Lab Results  Component Value Date   VITAMINB12 367  05/06/2022   Lab Results  Component Value Date   TSH 4.210 05/06/2022    03/03/2019 CT head and maxillofacial [I reviewed images myself and agree with interpretation. -VRP]  1. No acute intracranial pathology. 2. Left frontal nondisplaced skull fracture extending to the roof of the left frontal sinus and extends through the anterior wall of the right frontal sinus. No fluid in the frontal sinuses. No pneumocephalus. 3.  No acute osseous injury of the maxillofacial bones. 4. Posterior scalp hematoma near the vertex.   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  Can be seen in setting of intracranial hypotension.  Otherwise normal for age MRI brain.     ASSESSMENT AND PLAN  66 y.o. year old female here with chronic dizziness, fatigue, balance issues, starting in 2015, 2016, significantly worsened in May 2020 head injury, and additional worsening in the last 6 months.   Dx:  1. Dizziness   2. Gait difficulty   3. Other fatigue      PLAN:  POST-CONCUSSION SYNDROME (fatigue, dizziness, gait diff; May 2020) - worsening since last 6-12 months - check MRI brain w/wo (worsening fatigue, dizziness, fatigue) - continue PT exercise  Orders Placed This Encounter  Procedures   MR [place]   Return for pending if symptoms worsen or fail to  improve, return to PCP.    Penni Bombard, MD 12/23/5085, 1:99 PM Certified in Neurology, Neurophysiology and Neuroimaging  [facility] [address], [place], [place] [address] [phone]

## 2022-07-10 NOTE — Therapy (Signed)
OUTPATIENT PHYSICAL THERAPY VESTIBULAR TREATMENT    Patient Name: Rhonda Gardner MRN: [id number] DOB:11-25-1955, 66 y.o., female Today's Date: 07/10/2022  PCP: Rita Ohara, MD  REFERRING PROVIDER: Rita Ohara, MD    PT End of Session - 07/10/22 [phone]     Visit Number 11    Number of Visits 13   with eval   Date for PT Re-Evaluation 07/29/22   to allow for scheduling conflicts   Authorization Type Medicare    Progress Note Due on Visit 10    PT Start Time 0935    PT Stop Time 1015    PT Time Calculation (min) 40 min    Equipment Utilized During Treatment Gait belt    Activity Tolerance Patient tolerated treatment well    Behavior During Therapy [facility] for tasks assessed/performed                 Past Medical History:  Diagnosis Date   Allergy 2006   chemical cauterization in spring 2006, and 2nd treatment with good results.(Dr.Shoemaker)   Bipolar disorder ([place])    Bleeding ulcer    Blood transfusion without reported diagnosis 07/2017   Bleeding ulcer   Breast cancer ([place]) 12/2014   invasive ductal carcinoma (LEFT)   Breast cancer of upper-outer quadrant of left female breast ([place]) 12/22/2014   Breast hematoma 12/19/2015   had left breast aspiration of 4cm hematoma-path indicates benign hematoma   C. difficile colitis    C. 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, treated at [facility] (radiation)   Osteoporosis 06/20/2016   T-2.5_0  on Dexa ([place])   Plantar fasciitis    PONV (postoperative nausea and vomiting)    Symptomatic anemia 07/16/2017   Past Surgical History:  Procedure Laterality Date   BIOPSY  07/03/2018   Procedure: BIOPSY;  Surgeon: Gatha Mayer, MD;  Location: [facility];  Service: Endoscopy;;   BIOPSY  09/02/2018   Procedure: BIOPSY;  Surgeon: Gatha Mayer, MD;  Location: [facility];  Service: Endoscopy;;   BIOPSY  10/22/2018   Procedure: BIOPSY;  Surgeon: Milus Banister, MD;   Location: [facility];  Service: Endoscopy;;   BREAST BIOPSY Bilateral    benign and a right stereotatic breast biopsy.   COLONOSCOPY     ESOPHAGOGASTRODUODENOSCOPY N/A 07/16/2017   Procedure: ESOPHAGOGASTRODUODENOSCOPY (EGD);  Surgeon: Ladene Artist, MD;  Location: [facility] ENDOSCOPY;  Service: Endoscopy;  Laterality: N/A;   ESOPHAGOGASTRODUODENOSCOPY N/A 10/22/2018   Procedure: ESOPHAGOGASTRODUODENOSCOPY (EGD);  Surgeon: Milus Banister, MD;  Location: [facility];  Service: Endoscopy;  Laterality: N/A;   ESOPHAGOGASTRODUODENOSCOPY (EGD) WITH PROPOFOL N/A 07/03/2018   Procedure: ESOPHAGOGASTRODUODENOSCOPY (EGD) WITH PROPOFOL;  Surgeon: Gatha Mayer, MD;  Location: [facility];  Service: Endoscopy;  Laterality: N/A;   ESOPHAGOGASTRODUODENOSCOPY (EGD) WITH PROPOFOL N/A 09/02/2018   Procedure: ESOPHAGOGASTRODUODENOSCOPY (EGD) WITH PROPOFOL;  Surgeon: Gatha Mayer, MD;  Location: [facility];  Service: Endoscopy;  Laterality: N/A;   EUS N/A 10/22/2018   Procedure: UPPER ENDOSCOPIC ULTRASOUND (EUS) RADIAL;  Surgeon: Milus Banister, MD;  Location: [facility];  Service: Endoscopy;  Laterality: N/A;   fibroid excision  age 32   prolapsed fibroid removed   FINE NEEDLE ASPIRATION  10/22/2018   Procedure: FINE NEEDLE ASPIRATION;  Surgeon: Milus Banister, MD;  Location: [facility];  Service: Endoscopy;;   FOOT SURGERY Left 2014   Dr. Paulla Dolly   FOOT SURGERY Right 08/2016   Dr. Paulla Dolly --Shortening  Osteotomy with Fixation 1st Metatarsal    HAMMER TOE SURGERY Right 12/04/2021   4th toe; Dr. Paulla Dolly   MOUTH SURGERY  06/2017   CYST    PORT-A-CATH REMOVAL N/A 02/15/2016   Procedure: REMOVAL PORT-A-CATH;  Surgeon: Autumn Messing III, MD;  Location: [facility];  Service: General;  Laterality: N/A;   RADIOACTIVE SEED GUIDED PARTIAL MASTECTOMY WITH AXILLARY SENTINEL LYMPH NODE BIOPSY Left 05/25/2015   Procedure: LEFT BREAST LUMPECTOMY WITH RADIOACTIVE SEED AND SENTINEL LYMPH NODE BIOPSY;   Surgeon: Autumn Messing III, MD;  Location: [facility];  Service: General;  Laterality: Left;   UPPER GASTROINTESTINAL ENDOSCOPY     Patient Active Problem List   Diagnosis Date Noted   Cognitive deficits 05/16/2020   Visual disturbance 05/16/2020   Sleep disturbance 04/04/2020   Post concussive syndrome 03/02/2020   Dizziness and giddiness 07/30/5101   Follicular lymphoma ([place]) 11/16/2018   Duodenum disorder    Upper gastrointestinal bleed 07/02/2018   Abdominal pain, epigastric 07/01/2018   Elevated lipase 07/01/2018   Elevated LFTs 07/01/2018   Iron deficiency anemia 08/17/2017   C. 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 Is pleased with her progress. Reports dizziness is only happening during the evening.   Pt accompanied by: self  PERTINENT HISTORY:  Bipolar disorder, osteoporosis, history of breast cancer, history of lymphoma, history of skull  fracture  PAIN:  Are you having pain? No  There were no vitals filed for this visit.   Sitting and standing.   PRECAUTIONS: Fall   PATIENT GOALS "be less dizzy and improve my balance" "get my life back"   OBJECTIVE:   VESTIBULAR TREATMENT: Gaze Adaptation:                       x1 Viewing Horizontal: Position: Standing on air ex Feet Apart > feet closer together with busy background, Time: 60, Reps: 2, and Comment: pt reporting no dizziness, just more unsteady on air ex (esp with feet closer together)   and x1 Viewing Vertical: Position: Standing on air ex Feet Apart >  feet closer together  with busy background, Time: 60, Reps: 2, and Comment: pt reporting mild wooziness.   Forward gait with VOR x1- horizontal 3 x 20' , vertical 3 x 20', pt more unsteady with initial rep needing min guard, but improved with incr reps.    Pt more challenged with horizontal head motions.   Standing Balance:  Forward gait over 10-15' with quick turns to R/L  x8 reps each direction, pt initially needing to use stepping strategy in order to regain balance and min guard, pt with improved balance and stability with incr reps. Pt overall pleased with her improvements in her walking with turning.  On thicker blue foam: EC with feet hip width 2 x 30 seconds and then slightly close together x30 seconds with intermittent taps for balance, with EO marching for SLS x10 reps each leg, then performed with head turns x10 reps each side.  On air ex x10 reps sit <> stands with EC, pt's balance looked good with this On black side of BOSU: 2 sets of 10 reps head turns, 2 sets of 10 reps head nods. Pt more challenged initially with head nods, but improved with incr reps.      PATIENT EDUCATION  Education details: Continue with HEP, checking goals next week and likely D/C  Person educated: Patient Education method: Explanation Education comprehension: verbalized understanding and returned demonstration  HEP:   Standing VOR x1 60 seconds, walking forwards with EC at countertop [id number]  HEP updated on 06-24-22:  [employer] [id number]   GOALS: Goals reviewed with patient? Yes  SHORT TERM GOALS: Target date: 06/24/2022   Initial HEP Baseline: Goal status: Goal met 06-24-22  2.  Pt will improve score on DHI by 5 points to demonstrate decreased disability level Baseline: 46 points (8/21) Goal status: Ongoing - 06-24-22 - will reassess at D/C  3.  Pt will improve score on m-CTSIB by 5 sec to demonstrate improved balance Baseline: 96/120 (8/21) :   91/120 on 06-24-22 Goal status: Goal not met 06-24-22:    4.  Pt will undergo further assessment of FGA with LTG written. Baseline: 22/30 ; score 24/30 on 06-24-22 Goal status: MET   LONG TERM GOALS: Target date: 07/15/2022    Pt will be independent with final HEP for improved strength, balance, transfers and gait.  Baseline:  Goal status: INITIAL  2.  Pt will improve score on DHI by 10 points to demonstrate decreased disability level Baseline: 46 points Goal status: INITIAL  3.  Pt will improve score on m-CTSIB by 10 points to demonstrate improved balance Baseline: 96/120 (8/21) Goal status: INITIAL  4.  Pt will improve DVA to a 3 line or less difference in order to demo improved VOR.  Baseline: 5 line difference  Goal status: INITIAL  5.  Pt will perform MSQ items and rate 0-1/5 dizziness in order to demo improved motion sensitivity.  Baseline: 2-3 dizziness  Goal status: INITIAL  6.  Pt will improve FGA to at least a 26/30 in order to demo decr fall risk.  Baseline:  Goal status: INITIAL  ASSESSMENT:  CLINICAL IMPRESSION: Today's skilled session continued to focus on balance strategies for incr vestibular input and progressing VOR x1 to a compliant surface with a busy background/with gait. Pt more unsteady with balance tasks with head turns. With gait with quick turns, pt initially needing to maintain balance with using stepping  strategy, but balance improved with incr reps. Pt is progressing well and should be ready for D/C next week. Pt in agreement with plan.    OBJECTIVE IMPAIRMENTS Abnormal gait, decreased balance, decreased knowledge of condition, decreased mobility, dizziness, impaired perceived functional ability, and impaired vision/preception.   ACTIVITY LIMITATIONS carrying, lifting, bending, squatting, stairs, transfers, bathing, and hygiene/grooming  PARTICIPATION LIMITATIONS: meal prep, cleaning, laundry, medication management, personal finances, interpersonal relationship, driving, shopping, and community activity  PERSONAL FACTORS Past/current experiences, Time since onset of  injury/illness/exacerbation, and 1-2 comorbidities:    Bipolar disorder, osteoporosis, history of breast cancer, history of lymphoma, history of skull fractureare also affecting patient's functional outcome.   REHAB POTENTIAL: Good  CLINICAL DECISION MAKING: Stable/uncomplicated  EVALUATION COMPLEXITY: Moderate   PLAN: PT FREQUENCY: 2x/week  PT DURATION: 6 weeks  PLANNED INTERVENTIONS: Therapeutic exercises, Therapeutic activity, Neuromuscular re-education, Balance training, Gait training, Patient/Family education, Self Care, Joint mobilization, Stair training, Vestibular training, Canalith repositioning, Visual/preceptual remediation/compensation, DME instructions, Cryotherapy, Moist heat, Manual therapy, and Re-evaluation  PLAN FOR NEXT SESSION:  Work on beginning to check LTGs and finalizing pt's HEP. Continue higher level balance on compliant surfaces/vestibular input for balance.    Arliss Journey, PT 07/10/2022, 11:49 AM

## 2022-07-11 ENCOUNTER — Telehealth: Payer: Self-pay | Admitting: Diagnostic Neuroimaging

## 2022-07-11 NOTE — Telephone Encounter (Signed)
medicare/BC[employer] sup NPR sent to GI  

## 2022-07-15 ENCOUNTER — Encounter: Payer: BLUE CROSS/BLUE SHIELD | Admitting: Physical Therapy

## 2022-07-16 ENCOUNTER — Ambulatory Visit: Payer: Medicare Other | Admitting: Physical Therapy

## 2022-07-17 ENCOUNTER — Encounter: Payer: BLUE CROSS/BLUE SHIELD | Admitting: Physical Therapy

## 2022-07-18 ENCOUNTER — Ambulatory Visit: Payer: Medicare Other | Admitting: Physical Therapy

## 2022-07-23 ENCOUNTER — Encounter: Payer: Self-pay | Admitting: Internal Medicine

## 2022-07-24 ENCOUNTER — Ambulatory Visit: Payer: Medicare Other | Attending: Family Medicine | Admitting: Physical Therapy

## 2022-07-24 ENCOUNTER — Encounter: Payer: Self-pay | Admitting: Physical Therapy

## 2022-07-24 DIAGNOSIS — R2689 Other abnormalities of gait and mobility: Secondary | ICD-10-CM | POA: Insufficient documentation

## 2022-07-24 DIAGNOSIS — R2681 Unsteadiness on feet: Secondary | ICD-10-CM | POA: Diagnosis present

## 2022-07-24 DIAGNOSIS — R42 Dizziness and giddiness: Secondary | ICD-10-CM | POA: Diagnosis present

## 2022-07-24 NOTE — Therapy (Addendum)
OUTPATIENT PHYSICAL THERAPY VESTIBULAR TREATMENT    Patient Name: Rhonda Gardner MRN: [id number] DOB:[birth year], 66 y.o., female Today's Date: 07/24/2022  PCP: Rita Ohara, MD  REFERRING PROVIDER: Rita Ohara, MD    PT End of Session - 07/24/22 0805     Visit Number 12    Number of Visits 13   with eval   Date for PT Re-Evaluation 07/29/22   to allow for scheduling conflicts   Authorization Type Medicare    Progress Note Due on Visit 10    PT Start Time 0803    PT Stop Time 4098    PT Time Calculation (min) 41 min    Equipment Utilized During Treatment Gait belt    Activity Tolerance Patient tolerated treatment well    Behavior During Therapy [facility] for tasks assessed/performed                 Past Medical History:  Diagnosis Date   Allergy 2006   chemical cauterization in spring 2006, and 2nd treatment with good results.(Dr.Shoemaker)   Bipolar disorder ([place])    Bleeding ulcer    Blood transfusion without reported diagnosis 07/2017   Bleeding ulcer   Breast cancer ([place]) 12/2014   invasive ductal carcinoma (LEFT)   Breast cancer of upper-outer quadrant of left female breast ([place]) 12/22/2014   Breast hematoma 12/19/2015   had left breast aspiration of 4cm hematoma-path indicates benign hematoma   C. difficile colitis    C. 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)   Osteoporosis 06/20/2016   T-2.5'@spine'  on Dexa ([place])   Plantar fasciitis    PONV (postoperative nausea and vomiting)    Symptomatic anemia 07/16/2017   Past Surgical History:  Procedure Laterality Date   BIOPSY  07/03/2018   Procedure: BIOPSY;  Surgeon: Gatha Mayer, MD;  Location: [facility] ENDOSCOPY;  Service: Endoscopy;;   BIOPSY  09/02/2018   Procedure: BIOPSY;  Surgeon: Gatha Mayer, MD;  Location: [facility];  Service: Endoscopy;;   BIOPSY  10/22/2018   Procedure: BIOPSY;  Surgeon: Milus Banister, MD;   Location: [facility];  Service: Endoscopy;;   BREAST BIOPSY Bilateral    benign and a right stereotatic breast biopsy.   COLONOSCOPY     ESOPHAGOGASTRODUODENOSCOPY N/A 07/16/2017   Procedure: ESOPHAGOGASTRODUODENOSCOPY (EGD);  Surgeon: Ladene Artist, MD;  [facility];  Service: Endoscopy;  Laterality: N/A;   ESOPHAGOGASTRODUODENOSCOPY N/A 10/22/2018   Procedure: ESOPHAGOGASTRODUODENOSCOPY (EGD);  Surgeon: Milus Banister, MD;  Location: [facility];  Service: Endoscopy;  Laterality: N/A;   ESOPHAGOGASTRODUODENOSCOPY (EGD) WITH PROPOFOL N/A 07/03/2018   Procedure: ESOPHAGOGASTRODUODENOSCOPY (EGD) WITH PROPOFOL;  Surgeon: Gatha Mayer, MD;  Location: [facility];  Service: Endoscopy;  Laterality: N/A;   ESOPHAGOGASTRODUODENOSCOPY (EGD) WITH PROPOFOL N/A 09/02/2018   Procedure: ESOPHAGOGASTRODUODENOSCOPY (EGD) WITH PROPOFOL;  Surgeon: Gatha Mayer, MD;  Location: [facility];  Service: Endoscopy;  Laterality: N/A;   EUS N/A 10/22/2018   Procedure: UPPER ENDOSCOPIC ULTRASOUND (EUS) RADIAL;  Surgeon: Milus Banister, MD;  Location: [facility];  Service: Endoscopy;  Laterality: N/A;   fibroid excision  age 36   prolapsed fibroid removed   FINE NEEDLE ASPIRATION  10/22/2018   Procedure: FINE NEEDLE ASPIRATION;  Surgeon: Milus Banister, MD;  Location: [facility];  Service: Endoscopy;;   FOOT SURGERY Left 2014   Dr. Paulla Dolly   FOOT SURGERY Right 08/2016   Dr. Paulla Dolly --Shortening  Osteotomy with Fixation 1st Metatarsal    HAMMER TOE SURGERY Right 12/04/2021   4th toe; Dr. Paulla Dolly   MOUTH SURGERY  06/2017   CYST    PORT-A-CATH REMOVAL N/A 02/15/2016   Procedure: REMOVAL PORT-A-CATH;  Surgeon: Autumn Messing III, MD;  Location: [place];  Service: General;  Laterality: N/A;   RADIOACTIVE SEED GUIDED PARTIAL MASTECTOMY WITH AXILLARY SENTINEL LYMPH NODE BIOPSY Left 05/25/2015   Procedure: LEFT BREAST LUMPECTOMY WITH RADIOACTIVE SEED AND SENTINEL LYMPH NODE BIOPSY;   Surgeon: Autumn Messing III, MD;  Location: [place];  Service: General;  Laterality: Left;   UPPER GASTROINTESTINAL ENDOSCOPY     Patient Active Problem List   Diagnosis Date Noted   Cognitive deficits 05/16/2020   Visual disturbance 05/16/2020   Sleep disturbance 04/04/2020   Post concussive syndrome 03/02/2020   Dizziness and giddiness 85/46/2703   Follicular lymphoma ([place]) 11/16/2018   Duodenum disorder    Upper gastrointestinal bleed 07/02/2018   Abdominal pain, epigastric 07/01/2018   Elevated lipase 07/01/2018   Elevated LFTs 07/01/2018   Iron deficiency anemia 08/17/2017   C. 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 Reports that she thinks that therapy has really helped. Not stumbling around as much. Does not feel like she is going to fall. Saw her neurologist recently and they wanted her to get an MRI. Has it scheduled this Friday.   Pt  accompanied by: self  PERTINENT HISTORY:  Bipolar disorder, osteoporosis, history of breast cancer, history of lymphoma, history of skull fracture  PAIN:  Are you having pain? No  There were no vitals filed for this visit.   Sitting and standing.   PRECAUTIONS: Fall   PATIENT GOALS "be less dizzy and improve my balance" "get my life back"   OBJECTIVE:   Goal Assessment: DHI: 38   MOTION SENSITIVITY:                         Motion Sensitivity Quotient   Intensity: 0 = none, 1 = Lightheaded, 2 = Mild, 3 = Moderate, 4 = Severe, 5 = Vomiting   Intensity  1. Sitting to supine  0  2. Supine to L side 0  3. Supine to R side 0  4. Supine to sitting 0  5. L Hallpike-Dix   6. Up from L    7. R Hallpike-Dix   8. Up from R    9. Sitting, head  tipped to L knee  0  10. Head up from L  knee  0  11. Sitting, head  tipped to R knee 0  12. Head up from R  knee 0  13. Sitting head turns x5 3  14.Sitting head nods x5 3  15. In stance, 180  turn to L  3  16. In stance, 180  turn to R 3                [facility] PT Assessment - 07/24/22 0809       Functional Gait  Assessment   Gait assessed  Yes    Gait Level Surface Walks 20 ft in less than 5.5 sec, no assistive devices, good speed, no evidence for imbalance, normal gait pattern, deviates no more than 6 in outside of the 12 in walkway width.    Change in Gait Speed Able to smoothly change walking speed without loss of balance or gait deviation. Deviate no more than 6 in outside of the 12 in walkway width.    Gait with Horizontal Head Turns Performs head turns smoothly with slight change in gait velocity (eg, minor disruption to smooth gait path), deviates 6-10 in outside 12 in walkway width, or uses an assistive device.    Gait with Vertical Head Turns Performs head turns with no change in gait. Deviates no more than 6 in outside 12 in walkway width.    Gait and Pivot Turn Pivot turns safely within 3 sec and stops quickly with  no loss of balance.    Step Over Obstacle Is able to step over 2 stacked shoe boxes taped together (9 in total height) without changing gait speed. No evidence of imbalance.    Gait with Narrow Base of Support Ambulates 4-7 steps.   5 steps   Gait with Eyes Closed Walks 20 ft, slow speed, abnormal gait pattern, evidence for imbalance, deviates 10-15 in outside 12 in walkway width. Requires more than 9 sec to ambulate 20 ft.    Ambulating Backwards Walks 20 ft, no assistive devices, good speed, no evidence for imbalance, normal gait    Steps Alternating feet, no rail.    Total Score 25    FGA comment: 25/30 = low fall risk              VESTIBULAR TREATMENT: Standing Balance: Surface: Pillows - 2 Position: Feet Hip Width Apart > more narrow BOS Completed with: Eyes Closed; Head Turns x 2 sets of 10 Reps and Head Nods x 2 sets of 10 Reps    Verbally reviewed EC with head motions in the corner for HEP as pt had not been performing at home. Discussed will go through HEP at next session and work on finalizing it for D/C.     PATIENT EDUCATION  Education details: Results of LTGs, plan to D/C at next session and finalize HEP. Importance of continuing exercises going forwards.  Person educated: Patient Education method: Explanation Education comprehension: verbalized understanding and returned demonstration  HEP:  Standing VOR x1 60 seconds - with foam pad and busy background walking forwards with EC at [place] [id number]  HEP updated on 06-24-22:  [employer] [id number]   GOALS: Goals reviewed with patient? Yes  SHORT TERM GOALS: Target date: 06/24/2022   Initial HEP Baseline: Goal status: Goal met 06-24-22  2.  Pt will improve score on DHI by 5 points to demonstrate decreased disability level Baseline: 46 points (8/21) Goal status: Ongoing - 06-24-22 - will reassess at D/C  3.  Pt will improve score on m-CTSIB by 5 sec to demonstrate improved balance Baseline: 96/120 (8/21) :    91/120 on 06-24-22 Goal status: Goal not met 06-24-22:    4.  Pt will undergo further assessment of FGA with LTG written. Baseline: 22/30 ; score 24/30 on 06-24-22 Goal status: MET   LONG TERM GOALS: Target date: 07/15/2022    Pt will be independent with final HEP for improved strength, balance, transfers and gait.  Baseline:  Goal status: INITIAL  2.  Pt will improve score on DHI by 10 points to demonstrate decreased disability level Baseline: 46 points; 38 points on 07/24/22 Goal status: PARTIALLY MET  3.  Pt will improve score on m-CTSIB by 10 points to demonstrate improved balance Baseline: 96/120 (8/21) Goal status: INITIAL  4.  Pt will improve DVA to a 3 line or less difference in order to demo improved VOR.  Baseline: 5 line difference  Goal status: INITIAL  5.  Pt will perform MSQ items and rate 0-1/5 dizziness in order to demo improved motion sensitivity.  Baseline: 2-3 dizziness; 3/5 dizziness with head motions/turns, remainder 0/5  Goal status: PARTIALLY MET   6.  Pt will improve FGA to at least a 26/30 in order to demo decr fall risk.  Baseline: 25/30 - improvement from 24/30, but not to goal level  Goal status: PARTIALLY MET   ASSESSMENT:  CLINICAL IMPRESSION: Today's skilled session focused on beginning to assess LTGs for anticipated D/C at the end of this week. Pt has partially/almost met 3 out of 6 LTGs. Pt improved DHI score to a 38 (previously was a 46), indicating less handicap in regards to dizziness, but not quite to goal. Pt scored a 25/30 on the FGA (was a 22/30 at eval), putting pt at a low risk of falls. Pt still most challenged by gait with head turns, tandem stance, and EC gait. Pt able to improve MSQ items for bed mobility, but with turns and head motions rated moderate dizziness today compared to mild at last session. Overall, pt reports great improvement with her balance and dizziness since starting therapy. Will plan to D/C at next session due to  improvement with pt to continue to work on HEP going forwards for vestibular related deficits. Pt in agreement with plan.    OBJECTIVE IMPAIRMENTS Abnormal gait, decreased balance, decreased knowledge of condition, decreased mobility, dizziness, impaired perceived functional ability, and impaired vision/preception.   ACTIVITY LIMITATIONS carrying, lifting, bending, squatting, stairs, transfers, bathing, and hygiene/grooming  PARTICIPATION LIMITATIONS: meal prep, cleaning, laundry, medication management, personal finances, interpersonal relationship, driving, shopping, and community activity  PERSONAL FACTORS Past/current experiences, Time since onset of injury/illness/exacerbation, and 1-2 comorbidities:    Bipolar disorder, osteoporosis, history of breast cancer, history of lymphoma, history of skull fractureare also affecting patient's functional outcome.   REHAB POTENTIAL: Good  CLINICAL DECISION MAKING: Stable/uncomplicated  EVALUATION COMPLEXITY: Moderate   PLAN: PT FREQUENCY: 2x/week  PT DURATION: 6 weeks  PLANNED INTERVENTIONS: Therapeutic exercises, Therapeutic activity, Neuromuscular re-education, Balance training, Gait training, Patient/Family education, Self Care, Joint mobilization, Stair training, Vestibular training, Canalith repositioning, Visual/preceptual remediation/compensation, DME instructions, Cryotherapy, Moist heat, Manual therapy, and Re-evaluation  PLAN FOR NEXT SESSION:  Check remainder of LTGs, finalize pt's HEP. Plan for D/C.   Arliss Journey, PT, DPT  07/24/2022, 11:23 AM

## 2022-07-25 ENCOUNTER — Ambulatory Visit: Payer: Medicare Other | Admitting: Physical Therapy

## 2022-07-25 ENCOUNTER — Encounter: Payer: Self-pay | Admitting: Physical Therapy

## 2022-07-25 DIAGNOSIS — R2681 Unsteadiness on feet: Secondary | ICD-10-CM | POA: Diagnosis not present

## 2022-07-25 DIAGNOSIS — R42 Dizziness and giddiness: Secondary | ICD-10-CM

## 2022-07-25 NOTE — Therapy (Signed)
OUTPATIENT PHYSICAL THERAPY VESTIBULAR TREATMENT    Patient Name: Rhonda Gardner MRN: [id number] DOB:07-05-1956, 66 y.o., female Today's Date: 07/25/2022  PCP: Rita Ohara, MD  REFERRING PROVIDER: Rita Ohara, MD    PT End of Session - 07/25/22 0805     Visit Number 13    Number of Visits 13   with eval   Date for PT Re-Evaluation 07/29/22   to allow for scheduling conflicts   Authorization Type Medicare    Progress Note Due on Visit 10    PT Start Time 0801    PT Stop Time 0846    PT Time Calculation (min) 45 min    Equipment Utilized During Treatment Gait belt    Activity Tolerance Patient tolerated treatment well    Behavior During Therapy [facility] for tasks assessed/performed                 Past Medical History:  Diagnosis Date   Allergy 2006   chemical cauterization in spring 2006, and 2nd treatment with good results.(Dr.Shoemaker)   Bipolar disorder ([place])    Bleeding ulcer    Blood transfusion without reported diagnosis 07/2017   Bleeding ulcer   Breast cancer ([place]) 12/2014   invasive ductal carcinoma (LEFT)   Breast cancer of upper-outer quadrant of left female breast ([place]) 12/22/2014   Breast hematoma 12/19/2015   had left breast aspiration of 4cm hematoma-path indicates benign hematoma   C. difficile colitis    C. 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)   Osteoporosis 06/20/2016   T-2.5_0  on Dexa ([place])   Plantar fasciitis    PONV (postoperative nausea and vomiting)    Symptomatic anemia 07/16/2017   Past Surgical History:  Procedure Laterality Date   BIOPSY  07/03/2018   Procedure: BIOPSY;  Surgeon: Gatha Mayer, MD;  Location: [facility] ENDOSCOPY;  Service: Endoscopy;;   BIOPSY  09/02/2018   Procedure: BIOPSY;  Surgeon: Gatha Mayer, MD;  Location: [facility];  Service: Endoscopy;;   BIOPSY  10/22/2018   Procedure: BIOPSY;  Surgeon: Milus Banister, MD;   Location: [facility];  Service: Endoscopy;;   BREAST BIOPSY Bilateral    benign and a right stereotatic breast biopsy.   COLONOSCOPY     ESOPHAGOGASTRODUODENOSCOPY N/A 07/16/2017   Procedure: ESOPHAGOGASTRODUODENOSCOPY (EGD);  Surgeon: Ladene Artist, MD;  Location: [facility] ENDOSCOPY;  Service: Endoscopy;  Laterality: N/A;   ESOPHAGOGASTRODUODENOSCOPY N/A 10/22/2018   Procedure: ESOPHAGOGASTRODUODENOSCOPY (EGD);  Surgeon: Milus Banister, MD;  Location: [facility];  Service: Endoscopy;  Laterality: N/A;   ESOPHAGOGASTRODUODENOSCOPY (EGD) WITH PROPOFOL N/A 07/03/2018   Procedure: ESOPHAGOGASTRODUODENOSCOPY (EGD) WITH PROPOFOL;  Surgeon: Gatha Mayer, MD;  Location: [facility];  Service: Endoscopy;  Laterality: N/A;   ESOPHAGOGASTRODUODENOSCOPY (EGD) WITH PROPOFOL N/A 09/02/2018   Procedure: ESOPHAGOGASTRODUODENOSCOPY (EGD) WITH PROPOFOL;  Surgeon: Gatha Mayer, MD;  Location: [facility];  Service: Endoscopy;  Laterality: N/A;   EUS N/A 10/22/2018   Procedure: UPPER ENDOSCOPIC ULTRASOUND (EUS) RADIAL;  Surgeon: Milus Banister, MD;  Location: [facility];  Service: Endoscopy;  Laterality: N/A;   fibroid excision  age 27   prolapsed fibroid removed   FINE NEEDLE ASPIRATION  10/22/2018   Procedure: FINE NEEDLE ASPIRATION;  Surgeon: Milus Banister, MD;  Location: [facility];  Service: Endoscopy;;   FOOT SURGERY Left 2014   Dr. Paulla Dolly   FOOT SURGERY Right 08/2016   Dr. Paulla Dolly --Shortening  Osteotomy with Fixation 1st Metatarsal    HAMMER TOE SURGERY Right 12/04/2021   4th toe; Dr. Paulla Dolly   MOUTH SURGERY  06/2017   CYST    PORT-A-CATH REMOVAL N/A 02/15/2016   Procedure: REMOVAL PORT-A-CATH;  Surgeon: Autumn Messing III, MD;  Location: [place];  Service: General;  Laterality: N/A;   RADIOACTIVE SEED GUIDED PARTIAL MASTECTOMY WITH AXILLARY SENTINEL LYMPH NODE BIOPSY Left 05/25/2015   Procedure: LEFT BREAST LUMPECTOMY WITH RADIOACTIVE SEED AND SENTINEL LYMPH NODE BIOPSY;   Surgeon: Autumn Messing III, MD;  Location: [place];  Service: General;  Laterality: Left;   UPPER GASTROINTESTINAL ENDOSCOPY     Patient Active Problem List   Diagnosis Date Noted   Cognitive deficits 05/16/2020   Visual disturbance 05/16/2020   Sleep disturbance 04/04/2020   Post concussive syndrome 03/02/2020   Dizziness and giddiness 76/16/0737   Follicular lymphoma ([place]) 11/16/2018   Duodenum disorder    Upper gastrointestinal bleed 07/02/2018   Abdominal pain, epigastric 07/01/2018   Elevated lipase 07/01/2018   Elevated LFTs 07/01/2018   Iron deficiency anemia 08/17/2017   C. 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  Has MRI scheduled tomorrow   Pt accompanied by: self  PERTINENT HISTORY:  Bipolar disorder, osteoporosis, history of breast cancer, history of lymphoma, history of skull fracture  PAIN:  Are you having pain? No  There were no vitals filed for  this visit.   Sitting and standing.   PRECAUTIONS: Fall   PATIENT GOALS "be less dizzy and improve my balance" "get my life back"   OBJECTIVE:   Goal Assessment: DHI: 38   MOTION SENSITIVITY:                         Motion Sensitivity Quotient   Intensity: 0 = none, 1 = Lightheaded, 2 = Mild, 3 = Moderate, 4 = Severe, 5 = Vomiting   Intensity  1. Sitting to supine  0  2. Supine to L side 0  3. Supine to R side 0  4. Supine to sitting 0  5. L Hallpike-Dix   6. Up from L    7. R Hallpike-Dix   8. Up from R    9. Sitting, head  tipped to L knee  0  10. Head up from L  knee  0  11. Sitting, head  tipped to R knee 0  12. Head up from R  knee 0  13. Sitting head turns x5 3  14.Sitting head  nods x5 3  15. In stance, 180  turn to L  3  16. In stance, 180  turn to R 3                   VESTIBULAR TREATMENT: Standing Balance: Surface: Pillows - 2 Position: Feet Hip Width Apart > more narrow BOS Completed with: Eyes Closed; Head Turns x 2 sets of 10 Reps and Head Nods x 2 sets of 10 Reps    Ankle sways added to HEP - with EC - 10 reps - on floor with pt instructed to perform with cabinet behind her for safety   PATIENT EDUCATION  Education details: Results of LTGs, plan to D/C at next session and finalize HEP. Importance of continuing exercises going forwards.  Person educated: Patient Education method: Explanation Education comprehension: verbalized understanding and returned demonstration  HEP:  Standing VOR x1 60 seconds - with foam pad and busy background walking forwards with EC at [place] [id number]  HEP updated on 06-24-22:  Medbridge [email or web address]   Medbridge - updated HEP on 07-25-22 Access Code: [email or web address] URL: [email or web address] Date: 07/25/2022 Prepared by: Ethelene Browns  Exercises - Standing Anterior Posterior Weight Shift  - 1 x daily - 7 x weekly - 1 sets - 10 reps - Standing Gaze Stabilization with Head Rotation  - 1 x  daily - 7 x weekly - 3 sets - 10 reps - Romberg Stance on Foam Pad with Head Rotation  - 1 x daily - 7 x weekly - 3 sets - 10 reps  GOALS: Goals reviewed with patient? Yes  SHORT TERM GOALS: Target date: 06/24/2022   Initial HEP Baseline: Goal status: Goal met 06-24-22  2.  Pt will improve score on DHI by 5 points to demonstrate decreased disability level Baseline: 46 points (8/21) Goal status: Ongoing - 06-24-22 - will reassess at D/C  3.  Pt will improve score on m-CTSIB by 5 sec to demonstrate improved balance Baseline: 96/120 (8/21) :   91/120 on 06-24-22 Goal status: Goal not met 06-24-22:    4.  Pt will undergo further assessment of FGA with LTG written. Baseline: 22/30 ; score 24/30 on 06-24-22 Goal status: MET   LONG TERM GOALS: Target date: 07/15/2022    Pt will be independent with final HEP for improved strength, balance, transfers and gait.  Baseline:  Goal status: Goal met 07-25-22  2.  Pt will improve score on DHI by 10 points to demonstrate decreased disability level Baseline: 46 points; 38 points on 07/24/22 Goal status: PARTIALLY MET  3.  Pt will improve score on m-CTSIB by 10 points to demonstrate improved balance Baseline: 96/120 (8/21); score 109/120 on 07-25-22 Goal status: Goal met 07-25-22  4.  Pt will improve DVA to a 3 line or less difference in order to demo improved VOR.  Baseline: 5 line difference  Goal status:  PARTIALLY MET - 4 line difference on 07-25-22  5.  Pt will perform MSQ items and rate 0-1/5 dizziness in order to demo improved motion sensitivity.  Baseline: 2-3 dizziness; 3/5 dizziness with head motions/turns, remainder 0/5  Goal status: PARTIALLY MET   6.  Pt will improve FGA to at least a 26/30 in order to demo decr fall risk.  Baseline: 25/30 - improvement from 24/30, but not to goal level  Goal status: PARTIALLY MET   ASSESSMENT:  CLINICAL IMPRESSION:   Pt has met LTG's #1 and 3 with remaining LTG's partially met with LTG #6  closely  approximated as pt's FGA score 25/30,  goal set at 26/30.  HEP has been updated and pt verbalizes & demonstrates understanding of exercises.     OBJECTIVE IMPAIRMENTS Abnormal gait, decreased balance, decreased knowledge of condition, decreased mobility, dizziness, impaired perceived functional ability, and impaired vision/preception.   ACTIVITY LIMITATIONS carrying, lifting, bending, squatting, stairs, transfers, bathing, and hygiene/grooming  PARTICIPATION LIMITATIONS: meal prep, cleaning, laundry, medication management, personal finances, interpersonal relationship, driving, shopping, and community activity  PERSONAL FACTORS Past/current experiences, Time since onset of injury/illness/exacerbation, and 1-2 comorbidities:    Bipolar disorder, osteoporosis, history of breast cancer, history of lymphoma, history of skull fractureare also affecting patient's functional outcome.   REHAB POTENTIAL: Good  CLINICAL DECISION MAKING: Stable/uncomplicated  EVALUATION COMPLEXITY: Moderate   PLAN: PT FREQUENCY: 2x/week  PT DURATION: 6 weeks  PLANNED INTERVENTIONS: Therapeutic exercises, Therapeutic activity, Neuromuscular re-education, Balance training, Gait training, Patient/Family education, Self Care, Joint mobilization, Stair training, Vestibular training, Canalith repositioning, Visual/preceptual remediation/compensation, DME instructions, Cryotherapy, Moist heat, Manual therapy, and Re-evaluation  PLAN FOR NEXT SESSION:   D/C on 07-25-22 due to completion of program   PHYSICAL THERAPY DISCHARGE SUMMARY  Visits from Start of Care: 13  Current functional level related to goals / functional outcomes: See above for progress towards goals   Remaining deficits: Decreased high level balance deficits and cont. C/o dizziness with quick head movements/turns but much improved since initial eval   Education / Equipment: Pt has been instructed in HEP for balance and vestibular exercises     Patient agrees to discharge. Patient goals were partially met. Patient is being discharged due to meeting the stated rehab goals.    Alda Lea, PT 07/25/2022, 8:57 PM

## 2022-07-26 ENCOUNTER — Ambulatory Visit
Admission: RE | Admit: 2022-07-26 | Discharge: 2022-07-26 | Disposition: A | Discharge status: Home / Self Care | Payer: Medicare Other | Source: Ambulatory Visit | Attending: Diagnostic Neuroimaging | Admitting: Diagnostic Neuroimaging

## 2022-07-26 DIAGNOSIS — R42 Dizziness and giddiness: Secondary | ICD-10-CM | POA: Diagnosis not present

## 2022-07-26 DIAGNOSIS — R5383 Other fatigue: Secondary | ICD-10-CM

## 2022-07-26 DIAGNOSIS — R269 Unspecified abnormalities of gait and mobility: Secondary | ICD-10-CM

## 2022-07-26 MED ORDER — GADOPICLENOL 0.5 MMOL/ML IV SOLN
6.0000 mL | Freq: Once | INTRAVENOUS | Status: AC | PRN
  Administered 2022-07-26: 6 mL via INTRAVENOUS

## 2022-07-26 MED ORDER — GADOBENATE DIMEGLUMINE 529 MG/ML IV SOLN: 6.0000 mL | Freq: Once | INTRAVENOUS | Status: DC | PRN

## 2022-08-19 ENCOUNTER — Ambulatory Visit (INDEPENDENT_AMBULATORY_CARE_PROVIDER_SITE_OTHER): Payer: Medicare Other | Admitting: Family Medicine

## 2022-08-19 ENCOUNTER — Encounter: Payer: Self-pay | Admitting: Family Medicine

## 2022-08-19 VITALS — BP 118/72 | HR 68 | Temp 98.2°F | Ht 66.0 in | Wt 140.8 lb

## 2022-08-19 DIAGNOSIS — J309 Allergic rhinitis, unspecified: Secondary | ICD-10-CM

## 2022-08-19 DIAGNOSIS — H9201 Otalgia, right ear: Secondary | ICD-10-CM

## 2022-08-19 DIAGNOSIS — Z23 Encounter for immunization: Secondary | ICD-10-CM

## 2022-08-19 NOTE — Patient Instructions (Addendum)
Consider the new RSV vaccine. You should wait 2 weeks after today's flu shot before getting. You need to get this from the pharmacy.   Your ear is normal---no fluid, infection or wax.  I'm not sure what is making the noise. I'm suspect possible eustachian tube dysfunction, given the congestion on our nasal exam.  You seem to have significant congestion and allergies despite the flonase. Consider trying sinus rinses (sinus rinse kit or a net--pot--be sure to use boiled or distilled water, not tap water), once or twice daily. I recommend starting an antihistamine such as claritin, allegra or zyrtec, and taking this daily.

## 2022-08-19 NOTE — Progress Notes (Signed)
Chief Complaint  Patient presents with   Tinnitus    Periodically she gets a deep thumping in her right ear that comes and goes.    Thumping in right ear started last week. Also described as a tapping, irregular, not corresponding to heartbeat. No change in frequency, severity since it started. Sounds deep in her ear. Comes randomly. No change in hearing.  Denies any change in allergy symptoms--has some chronic nasal congestion. She has been using Flonase for the last 2 months or so. Denies sinus pain  She reports having very slight ringing, background noise, going on for years. No change in this.  PMH, PSH, SH reviewed  Outpatient Encounter Medications as of 08/19/2022  Medication Sig Note   Alpha-Lipoic Acid 600 MG TABS Take by mouth.    Calcium-Magnesium (CALCIUM MAGNESIUM 750) 300-300 MG TABS Take by mouth. Bid    Cholecalciferol (VITAMIN D-3) 125 MCG (5000 UT) TABS Take 5,000 Units by mouth daily. 04/14/2019: 3000-5000 daily   fluticasone (FLONASE) 50 MCG/ACT nasal spray Place 1 spray into both nostrils daily.    lamoTRIgine (LAMICTAL) 200 MG tablet Take 1/2 (one-half) tablet by mouth once daily    Lutein-Zeaxanthin 25-5 MG CAPS Take 1 capsule by mouth daily.    carboxymethylcellulose (REFRESH PLUS) 0.5 % SOLN Place 1 drop into both eyes 3 (three) times daily as needed (dry eyes.). (Patient not taking: Reported on 08/19/2022)    ibuprofen (ADVIL) 200 MG tablet Take 200 mg by mouth every 6 (six) hours as needed. (Patient not taking: Reported on 08/19/2022)    No facility-administered encounter medications on file as of 08/19/2022.   Uses sudafed prn for sinus headaches.  Allergies  Allergen Reactions   Clindamycin/Lincomycin Diarrhea    Resulted in C-diff   Other Nausea Only    Stronger pain meds   Adhesive [Tape] Rash    Pt is sensitive to any kind of adhesive tape products.    ROS: No fever, chills, URI symptoms, cough, shortness of breath, chest pain. No bleeding,  bruising, rashes. GI or GU complaints.   PHYSICAL EXAM:  BP 118/72   Pulse 68   Temp 98.2 F (36.8 C) (Tympanic)   Ht _0  (1.676 m)   Wt 140 lb 12.8 oz (63.9 kg)   BMI 22.73 kg/m   Pleasant, well-appearing female HEENT: conjunctiva and sclera are clear, EOMI.  Nasal mucosa is mild-mod edematous, clear mucus on the right. Some yellow crusting on the left. TM's and EAC's are normal. OP clear Neck: no lymphadenopathy, thyromegaly or mass Heart: regular rate and rhythm Lungs: clear bilaterally Extremities: no edema   ASSESSMENT/PLAN:   Discomfort of right ear - tapping/thumping. Normal exam.  Ddx reviewed--no significant cerumen, not related to HB; given nasal exam, suspect ETD. no infection.  Allergic rhinitis, unspecified seasonality, unspecified trigger - continue flonase.  Add daily nonsedating antihistamine to achieve better control.  Need for influenza vaccination - Plan: Flu Vaccine QUAD High Dose(Fluad)    Your ear is normal---no fluid, infection or wax.  I'm not sure what is making the noise. I'm suspect possible eustachian tube dysfunction, given the congestion on our nasal exam.  You seem to have significant congestion and allergies despite the flonase. Consider trying sinus rinses (sinus rinse kit or a net--pot--be sure to use boiled or distilled water, not tap water), once or twice daily. I recommend starting an antihistamine such as claritin, allegra or zyrtec, and taking this daily.

## 2022-10-11 ENCOUNTER — Encounter: Payer: Self-pay | Admitting: Family Medicine

## 2022-10-13 ENCOUNTER — Telehealth: Payer: Medicare Other | Admitting: Physician Assistant

## 2022-10-13 DIAGNOSIS — U071 COVID-19: Secondary | ICD-10-CM

## 2022-10-13 MED ORDER — BENZONATATE 100 MG PO CAPS: 100.0000 mg | ORAL_CAPSULE | Freq: Three times a day (TID) | ORAL | 0 refills | Status: DC | PRN

## 2022-10-13 MED ORDER — NIRMATRELVIR/RITONAVIR (PAXLOVID)TABLET: 3.0000 | ORAL_TABLET | Freq: Two times a day (BID) | ORAL | 0 refills | Status: AC

## 2022-10-13 NOTE — Patient Instructions (Signed)
Rhonda Gardner, thank you for joining Mar Daring, PA-C for today's virtual visit.  While this provider is not your primary care provider (PCP), if your PCP is located in our provider database this encounter information will be shared with them immediately following your visit.   [facility] account gives you access to today's visit and all your visits, tests, and labs performed at [facility] " click here if you don't have a [facility] account or go to [email or web address]  Consent: (Patient) Rhonda Gardner provided verbal consent for this virtual visit at the beginning of the encounter.  Current Medications:  Current Outpatient Medications:    Alpha-Lipoic Acid 600 MG TABS, Take by mouth., Disp: , Rfl:    benzonatate (TESSALON) 100 MG capsule, Take 1 capsule (100 mg total) by mouth 3 (three) times daily as needed., Disp: 30 capsule, Rfl: 0   Calcium-Magnesium (CALCIUM MAGNESIUM 750) 300-300 MG TABS, Take by mouth. Bid, Disp: , Rfl:    carboxymethylcellulose (REFRESH PLUS) 0.5 % SOLN, Place 1 drop into both eyes 3 (three) times daily as needed (dry eyes.). (Patient not taking: Reported on 08/19/2022), Disp: , Rfl:    Cholecalciferol (VITAMIN D-3) 125 MCG (5000 UT) TABS, Take 5,000 Units by mouth daily., Disp: , Rfl:    fluticasone (FLONASE) 50 MCG/ACT nasal spray, Place 1 spray into both nostrils daily., Disp: , Rfl:    ibuprofen (ADVIL) 200 MG tablet, Take 200 mg by mouth every 6 (six) hours as needed. (Patient not taking: Reported on 08/19/2022), Disp: , Rfl:    lamoTRIgine (LAMICTAL) 200 MG tablet, Take 1/2 (one-half) tablet by mouth once daily, Disp: 45 tablet, Rfl: 3   Lutein-Zeaxanthin 25-5 MG CAPS, Take 1 capsule by mouth daily., Disp: , Rfl:    nirmatrelvir/ritonavir (PAXLOVID) 20 x 150 MG & 10 x '100MG'$  TABS, Take 3 tablets by mouth 2 (two) times daily for 5 days. (Take nirmatrelvir 150 mg two tablets twice daily for 5 days and  ritonavir 100 mg one tablet twice daily for 5 days) Patient GFR is 64, Disp: 30 tablet, Rfl: 0   Medications ordered in this encounter:  Meds ordered this encounter  Medications   nirmatrelvir/ritonavir (PAXLOVID) 20 x 150 MG & 10 x '100MG'$  TABS    Sig: Take 3 tablets by mouth 2 (two) times daily for 5 days. (Take nirmatrelvir 150 mg two tablets twice daily for 5 days and ritonavir 100 mg one tablet twice daily for 5 days) Patient GFR is 64    Dispense:  30 tablet    Refill:  0    Order Specific Question:   Supervising Provider    Answer:   Chase Picket [[id number]]   benzonatate (TESSALON) 100 MG capsule    Sig: Take 1 capsule (100 mg total) by mouth 3 (three) times daily as needed.    Dispense:  30 capsule    Refill:  0    Order Specific Question:   Supervising Provider    Answer:   Chase Picket [id number]     *If you need refills on other medications prior to your next appointment, please contact your pharmacy*  Follow-Up: Call back or seek an in-person evaluation if the symptoms worsen or if the condition fails to improve as anticipated.  [facility] [phone]  Other Instructions  Can take to lessen severity: Vit C '500mg'$  twice daily Quercertin 250-'500mg'$  twice daily Zinc 75-'100mg'$  daily Melatonin 3-6 mg at bedtime Vit D3  1000-2000 IU daily Aspirin 81 mg daily with food Optional: Famotidine '20mg'$  daily Also can add tylenol/ibuprofen as needed for fevers and body aches May add Mucinex or Mucinex DM as needed for cough/congestion  COVID-19 COVID-19, or coronavirus disease 2019, is an infection that is caused by a new (novel) coronavirus called SARS-CoV-2. COVID-19 can cause many symptoms. In some people, the virus may not cause any symptoms. In others, it may cause mild or severe symptoms. Some people with severe infection develop severe disease. What are the causes? This illness is caused by a virus. The virus may be in the air as tiny specks of fluid  (aerosols) or droplets, or it may be on surfaces. You may catch the virus by: Breathing in droplets from an infected person. Droplets can be spread by a person breathing, speaking, singing, coughing, or sneezing. Touching something, like a table or a doorknob, that has virus on it (is contaminated) and then touching your mouth, nose, or eyes. What increases the risk? Risk for infection: You are more likely to get infected with the COVID-19 virus if: You are within 6 ft (1.8 m) of a person with COVID-19 for 15 minutes or longer. You are providing care for a person who is infected with COVID-19. You are in close personal contact with other people. Close personal contact includes hugging, kissing, or sharing eating or drinking utensils. Risk for serious illness caused by COVID-19: You are more likely to get seriously ill from the COVID-19 virus if: You have cancer. You have a long-term (chronic) disease, such as: Chronic lung disease. This includes pulmonary embolism, chronic obstructive pulmonary disease, and cystic fibrosis. Long-term disease that lowers your body's ability to fight infection (immunocompromise). Serious cardiac conditions, such as heart failure, coronary artery disease, or cardiomyopathy. Diabetes. Chronic kidney disease. Liver diseases. These include cirrhosis, nonalcoholic fatty liver disease, alcoholic liver disease, or autoimmune hepatitis. You have obesity. You are pregnant or were recently pregnant. You have sickle cell disease. What are the signs or symptoms? Symptoms of this condition can range from mild to severe. Symptoms may appear any time from 2 to 14 days after being exposed to the virus. They include: Fever or chills. Shortness of breath or trouble breathing. Feeling tired or very tired. Headaches, body aches, or muscle aches. Runny or stuffy nose, sneezing, coughing, or sore throat. New loss of taste or smell. This is rare. Some people may also have  stomach problems, such as nausea, vomiting, or diarrhea. Other people may not have any symptoms of COVID-19. How is this diagnosed? This condition may be diagnosed by testing samples to check for the COVID-19 virus. The most common tests are the PCR test and the antigen test. Tests may be done in the lab or at home. They include: Using a swab to take a sample of fluid from the back of your nose and throat (nasopharyngeal fluid), from your nose, or from your throat. Testing a sample of saliva from your mouth. Testing a sample of coughed-up mucus from your lungs (sputum). How is this treated? Treatment for COVID-19 infection depends on the severity of the condition. Mild symptoms can be managed at home with rest, fluids, and over-the-counter medicines. Serious symptoms may be treated in a hospital intensive care unit (ICU). Treatment in the ICU may include: Supplemental oxygen. Extra oxygen is given through a tube in the nose, a face mask, or a hood. Medicines. These may include: Antivirals, such as monoclonal antibodies. These help your body fight off certain  viruses that can cause disease. Anti-inflammatories, such as corticosteroids. These reduce inflammation and suppress the immune system. Antithrombotics. These prevent or treat blood clots, if they develop. Convalescent plasma. This helps boost your immune system, if you have an underlying immunosuppressive condition or are getting immunosuppressive treatments. Prone positioning. This means you will lie on your stomach. This helps oxygen to get into your lungs. Infection control measures. If you are at risk for more serious illness caused by COVID-19, your health care provider may prescribe two long-acting monoclonal antibodies, given together every 6 months. How is this prevented? To protect yourself: Use preventive medicine (pre-exposure prophylaxis). You may get pre-exposure prophylaxis if you have moderate or severe  immunocompromise. Get vaccinated. Anyone 48 months old or older who meets guidelines can get a COVID-19 vaccine or vaccine series. This includes people who are pregnant or making breast milk (lactating). Get an added dose of COVID-19 vaccine after your first vaccine or vaccine series if you have moderate to severe immunocompromise. This applies if you have had a solid organ transplant or have been diagnosed with an immunocompromising condition. You should get the added dose 4 weeks after you got the first COVID-19 vaccine or vaccine series. If you get an mRNA vaccine, you will need a 3-dose primary series. If you get the J&J/Janssen vaccine, you will need a 2-dose primary series, with the second dose being an mRNA vaccine. Talk to your health care provider about getting experimental monoclonal antibodies. This treatment is approved under emergency use authorization to prevent severe illness before or after being exposed to the COVID-19 virus. You may be given monoclonal antibodies if: You have moderate or severe immunocompromise. This includes treatments that lower your immune response. People with immunocompromise may not develop protection against COVID-19 when they are vaccinated. You cannot be vaccinated. You may not get a vaccine if you have a severe allergic reaction to the vaccine or its components. You are not fully vaccinated. You are in a facility where COVID-19 is present and: Are in close contact with a person who is infected with the COVID-19 virus. Are at high risk of being exposed to the COVID-19 virus. You are at risk of illness from new variants of the COVID-19 virus. To protect others: If you have symptoms of COVID-19, take steps to prevent the virus from spreading to others. Stay home. Leave your house only to get medical care. Do not use public transit, if possible. Do not travel while you are sick. Wash your hands often with soap and water for at least 20 seconds. If soap and  water are not available, use alcohol-based hand sanitizer. Make sure that all people in your household wash their hands well and often. Cough or sneeze into a tissue or your sleeve or elbow. Do not cough or sneeze into your hand or into the air. Where to find more information Centers for Disease Control and Prevention: CharmCourses.be World Health Organization: https://www.castaneda.info/ Get help right away if: You have trouble breathing. You have pain or pressure in your chest. You are confused. You have bluish lips and fingernails. You have trouble waking from sleep. You have symptoms that get worse. These symptoms may be an emergency. Get help right away. Call 911. Do not wait to see if the symptoms will go away. Do not drive yourself to the hospital. Summary COVID-19 is an infection that is caused by a new coronavirus. Sometimes, there are no symptoms. Other times, symptoms range from mild to severe. Some people with  a severe COVID-19 infection develop severe disease. The virus that causes COVID-19 can spread from person to person through droplets or aerosols from breathing, speaking, singing, coughing, or sneezing. Mild symptoms of COVID-19 can be managed at home with rest, fluids, and over-the-counter medicines. This information is not intended to replace advice given to you by your health care provider. Make sure you discuss any questions you have with your health care provider. Document Revised: 09/18/2021 Document Reviewed: 09/20/2021 Elsevier Patient Education  [place].    If you have been instructed to have an in-person evaluation today at a local Urgent Care facility, please use the link below. It will take you to a list of all of our available [facility], including address, phone number and hours of operation. Please do not delay care.  [facility]  If you or a family member do not have a primary care provider, use  the link below to schedule a visit and establish care. When you choose a [facility] primary care physician or advanced practice provider, you gain a long-term partner in health. Find a Primary Care Provider  Learn more about [facility] in-office and virtual care options: [facility]

## 2022-10-13 NOTE — Progress Notes (Signed)
Virtual Visit Consent   [place] Rhonda Gardner, you are scheduled for a virtual visit with a [facility] provider today. Just as with appointments in the office, your consent must be obtained to participate. Your consent will be active for this visit and any virtual visit you may have with one of our providers in the next 365 days. If you have a MyChart account, a copy of this consent can be sent to you electronically.  As this is a virtual visit, video technology does not allow for your provider to perform a traditional examination. This may limit your provider's ability to fully assess your condition. If your provider identifies any concerns that need to be evaluated in person or the need to arrange testing (such as labs, EKG, etc.), we will make arrangements to do so. Although advances in technology are sophisticated, we cannot ensure that it will always work on either your end or our end. If the connection with a video visit is poor, the visit may have to be switched to a telephone visit. With either a video or telephone visit, we are not always able to ensure that we have a secure connection.  By engaging in this virtual visit, you consent to the provision of healthcare and authorize for your insurance to be billed (if applicable) for the services provided during this visit. Depending on your insurance coverage, you may receive a charge related to this service.  I need to obtain your verbal consent now. Are you willing to proceed with your visit today? Rhonda Gardner has provided verbal consent on 10/13/2022 for a virtual visit (video or telephone). Mar Daring, PA-C  Date: 10/13/2022 9:56 AM  Virtual Visit via Video Note   I, Mar Daring, connected with  Rhonda Gardner  ([id number], 01/21/1956) on 10/13/22 at 10:00 AM EST by a video-enabled telemedicine application and verified that I am speaking with the correct person using two  identifiers.  Location: Patient: Virtual Visit Location Patient: Home Provider: Virtual Visit Location Provider: Home Office   I discussed the limitations of evaluation and management by telemedicine and the availability of in person appointments. The patient expressed understanding and agreed to proceed.    History of Present Illness: Rhonda Gardner is a 66 y.o. who identifies as a female who was assigned female at birth, and is being seen today for Covid 73.  HPI: URI  This is a new problem. The current episode started in the past 7 days (Tested positive for Covid 19 on at home test yesterday; symptoms started 2 days ago). The problem has been gradually worsening. Maximum temperature: low grade fever. The fever has been present for Less than 1 day. Associated symptoms include congestion, coughing, headaches, nausea (first night, but improved), a plugged ear sensation, rhinorrhea and a sore throat (scratchy). Pertinent negatives include no diarrhea, ear pain, sinus pain or vomiting. Associated symptoms comments: Fatigue, myalgias. Treatments tried: airborne, zicam, ibuprofen. The treatment provided no relief.     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   Medications:  Current Outpatient Medications:    Alpha-Lipoic Acid 600 MG TABS, Take by mouth., Disp: , Rfl:    benzonatate (TESSALON) 100 MG capsule, Take 1 capsule (100 mg total) by mouth 3 (three) times daily as needed., Disp: 30 capsule, Rfl: 0   Calcium-Magnesium (CALCIUM MAGNESIUM 750) 300-300 MG TABS, Take by mouth. Bid, Disp: , Rfl:    carboxymethylcellulose (REFRESH PLUS) 0.5 % SOLN, Place 1 drop into both eyes 3 (three) times daily as needed (dry eyes.). (Patient not taking: Reported on 08/19/2022), Disp: , Rfl:    Cholecalciferol (VITAMIN D-3) 125 MCG (5000 UT) TABS, Take 5,000 Units by mouth daily., Disp: , Rfl:    fluticasone (FLONASE) 50 MCG/ACT nasal spray, Place 1 spray into both nostrils daily., Disp: , Rfl:    ibuprofen (ADVIL) 200 MG tablet, Take 200 mg by mouth every 6 (six) hours as needed. (Patient not taking: Reported on 08/19/2022), Disp: , Rfl:    lamoTRIgine (LAMICTAL) 200 MG tablet, Take 1/2 (one-half) tablet by mouth once daily, Disp: 45 tablet, Rfl: 3   Lutein-Zeaxanthin 25-5 MG CAPS, Take 1 capsule by mouth daily., Disp: , Rfl:    nirmatrelvir/ritonavir (PAXLOVID) 20 x 150 MG & 10 x '100MG'$  TABS, Take 3 tablets by mouth 2 (two) times daily for 5 days. (Take nirmatrelvir 150 mg two tablets twice daily for 5 days and ritonavir 100 mg one tablet twice daily for 5 days) Patient GFR is 64, Disp:  30 tablet, Rfl: 0  Observations/Objective: Patient is well-developed, well-nourished in no acute distress.  Resting comfortably at home.  Head is normocephalic, atraumatic.  No labored breathing.  Speech is clear and coherent with logical content.  Patient is alert and oriented at baseline.    Assessment and Plan: 1. COVID-19 - nirmatrelvir/ritonavir (PAXLOVID) 20 x 150 MG & 10 x '100MG'$  TABS; Take 3 tablets by mouth 2 (two) times daily for 5 days. (Take nirmatrelvir 150 mg two tablets twice daily for 5 days and ritonavir 100 mg one tablet twice daily for 5 days) Patient GFR is 64  Dispense: 30 tablet; Refill: 0 - benzonatate (TESSALON) 100 MG capsule; Take 1 capsule (100 mg total) by mouth 3 (three) times daily as needed.  Dispense: 30 capsule; Refill: 0  - Continue OTC symptomatic management of choice - Will send OTC vitamins and supplement information through AVS - Paxlovid and tessalon prescribed - Patient enrolled in MyChart symptom monitoring - Push fluids - Rest as needed - Discussed return precautions and when to seek in-person evaluation, sent via AVS as well   Follow Up Instructions: I discussed the assessment and  treatment plan with the patient. The patient was provided an opportunity to ask questions and all were answered. The patient agreed with the plan and demonstrated an understanding of the instructions.  A copy of instructions were sent to the patient via MyChart unless otherwise noted below.    The patient was advised to call back or seek an in-person evaluation if the symptoms worsen or if the condition fails to improve as anticipated.  Time:  I spent 15 minutes with the patient via telehealth technology discussing the above problems/concerns.    Mar Daring, PA-C

## 2023-02-12 LAB — HM MAMMOGRAPHY

## 2023-02-13 ENCOUNTER — Encounter: Payer: Self-pay | Admitting: *Deleted

## 2023-02-20 ENCOUNTER — Ambulatory Visit (INDEPENDENT_AMBULATORY_CARE_PROVIDER_SITE_OTHER): Payer: Medicare Other | Admitting: Family Medicine

## 2023-02-20 ENCOUNTER — Encounter: Payer: Self-pay | Admitting: Physician Assistant

## 2023-02-20 ENCOUNTER — Encounter: Payer: Self-pay | Admitting: Family Medicine

## 2023-02-20 ENCOUNTER — Encounter: Payer: Self-pay | Admitting: Oncology

## 2023-02-20 VITALS — BP 118/72 | HR 80 | Ht 66.0 in | Wt 144.4 lb

## 2023-02-20 DIAGNOSIS — K648 Other hemorrhoids: Secondary | ICD-10-CM

## 2023-02-20 DIAGNOSIS — K625 Hemorrhage of anus and rectum: Secondary | ICD-10-CM

## 2023-02-20 LAB — CBC WITH DIFFERENTIAL/PLATELET
Basophils Absolute: 0.1 10*3/uL (ref 0.0–0.2)
Basos: 1 %
EOS (ABSOLUTE): 0.3 10*3/uL (ref 0.0–0.4)
Eos: 5 %
Hematocrit: 38 % (ref 34.0–46.6)
Hemoglobin: 12.6 g/dL (ref 11.1–15.9)
Immature Grans (Abs): 0 10*3/uL (ref 0.0–0.1)
Immature Granulocytes: 0 %
Lymphocytes Absolute: 1 10*3/uL (ref 0.7–3.1)
Lymphs: 18 %
MCH: 31 pg (ref 26.6–33.0)
MCHC: 33.2 g/dL (ref 31.5–35.7)
MCV: 94 fL (ref 79–97)
Monocytes Absolute: 0.5 10*3/uL (ref 0.1–0.9)
Monocytes: 9 %
Neutrophils Absolute: 3.8 10*3/uL (ref 1.4–7.0)
Neutrophils: 67 %
Platelets: 208 10*3/uL (ref 150–450)
RBC: 4.06 x10E6/uL (ref 3.77–5.28)
RDW: 12.5 % (ref 11.7–15.4)
WBC: 5.7 10*3/uL (ref 3.4–10.8)

## 2023-02-20 MED ORDER — HYDROCORTISONE ACETATE 25 MG RE SUPP: 25.0000 mg | Freq: Two times a day (BID) | RECTAL | 1 refills | Status: DC

## 2023-02-20 NOTE — Progress Notes (Signed)
Chief Complaint  Patient presents with   Blood In Stools    For last 3 weeks, 3 different times she has clearish, watery bright red blood in her stools. Called GI and cannot see a PA until July 22nd.    She noticed some watery/thin, light red which she thinks looked like blood, on the toilet paper after wiping after a bowel movement. She didn't look in the toilet. This occurred 3 times, with normal bowel movements in between. The first time was a small smear. The last time was a larger amount, noted for a couple of wipes.  It looks like a watered down red color, more transparent  Denies straining, burning/stinging/discomfort with bowel movements. No abdominal cramping or pain. She has known internal hemorrhoids. Blood isn't mixed in with stool. Only occurs in the morning (usually only moves bowels in the morning).  Had some tomatoes, but no other red foods, beets, red dyes.  No significant advil taken during this time frame (uses it just prn). No straining, lifting, diarrhea or constipation.  She scheduled with GI PA in 7/22 (the soonest she could get).  Last colonoscopy 2017, internal hemorrhoids, Dr. Noelle [facility]). +Fam Hx colon cancer, diagnosed when she was in her 2's.   PMH, PSH, SH reviewed  Outpatient Encounter Medications as of 02/20/2023  Medication Sig Note   Alpha-Lipoic Acid 600 MG TABS Take by mouth.    Calcium-Magnesium (CALCIUM MAGNESIUM 750) 300-300 MG TABS Take by mouth. Bid    Cholecalciferol (VITAMIN D-3) 125 MCG (5000 UT) TABS Take 5,000 Units by mouth daily. 04/14/2019: 3000-5000 daily   fluticasone (FLONASE) 50 MCG/ACT nasal spray Place 1 spray into both nostrils daily.    hydrocortisone (ANUSOL-HC) 25 MG suppository Place 1 suppository (25 mg total) rectally 2 (two) times daily.    lamoTRIgine (LAMICTAL) 200 MG tablet Take 1/2 (one-half) tablet by mouth once daily    Lutein-Zeaxanthin 25-5 MG CAPS Take 1 capsule by mouth daily.    carboxymethylcellulose  (REFRESH PLUS) 0.5 % SOLN Place 1 drop into both eyes 3 (three) times daily as needed (dry eyes.). (Patient not taking: Reported on 08/19/2022) 02/20/2023: As needed   ibuprofen (ADVIL) 200 MG tablet Take 200 mg by mouth every 6 (six) hours as needed. (Patient not taking: Reported on 08/19/2022) 02/20/2023: As needed   [DISCONTINUED] benzonatate (TESSALON) 100 MG capsule Take 1 capsule (100 mg total) by mouth 3 (three) times daily as needed.    No facility-administered encounter medications on file as of 02/20/2023.   Allergies  Allergen Reactions   Clindamycin/Lincomycin Diarrhea    Resulted in C-diff   Other Nausea Only    Stronger pain meds   Adhesive [Tape] Rash    Pt is sensitive to any kind of adhesive tape products.    No f/c, n/v/, no abdominal pain, urinary complaints. No vaginal bleeding or discharge. No URI or allergy symptoms. No CP, shortness of breath. Normal appetite, no weight changes.   BP 118/72   Pulse 80   Ht 5\' 6"  (1.676 m)   Wt 144 lb 6.4 oz (65.5 kg)   BMI 23.31 kg/m   Wt Readings from Last 3 Encounters:  02/20/23 144 lb 6.4 oz (65.5 kg)  08/19/22 140 lb 12.8 oz (63.9 kg)  07/10/22 141 lb 8 oz (64.2 kg)   Pleasant female, in good spirits HEENT: conjunctiva and sclera are clear, EOMI. OP clear Neck: no lymphadenopathy or thyromegaly Heart: regular rate and rhythm Lungs: clear bilaterally Abdomen: soft, nontender, no  mass, normal bowel sounds. Rectal: no external hemorrhoids or fissures noted.  Stool is light brown, heme negative. Anoscopy: Inflamed internal hemorrhoid, nonbleeding Extremities: no edema Skin: normal turgor no rash   ASSESSMENT/PLAN:  Internal hemorrhoid - Plan: hydrocortisone (ANUSOL-HC) 25 MG suppository  Rectal bleeding - based on exam, likely d/t internal hemorrhoids. Given her hx, will check CBC to r/o anemia from more chronic occult bleeding - Plan: CBC with Differential/Platelet  F/u with GI if persistent issues for more  definitive treatment and further evaluation. If sx worsen (fever, increased bleeding, pain, wt loss), will need sooner evaluation.

## 2023-04-25 ENCOUNTER — Other Ambulatory Visit: Payer: Self-pay | Admitting: Family Medicine

## 2023-04-25 DIAGNOSIS — F3170 Bipolar disorder, currently in remission, most recent episode unspecified: Secondary | ICD-10-CM

## 2023-05-05 ENCOUNTER — Ambulatory Visit: Payer: Medicare Other | Admitting: Physician Assistant

## 2023-05-05 ENCOUNTER — Encounter: Payer: Self-pay | Admitting: Oncology

## 2023-05-05 ENCOUNTER — Encounter: Payer: Self-pay | Admitting: Physician Assistant

## 2023-05-05 DIAGNOSIS — Z1211 Encounter for screening for malignant neoplasm of colon: Secondary | ICD-10-CM | POA: Diagnosis not present

## 2023-05-05 DIAGNOSIS — K921 Melena: Secondary | ICD-10-CM

## 2023-05-05 DIAGNOSIS — Z800 Family history of malignant neoplasm of digestive organs: Secondary | ICD-10-CM | POA: Diagnosis not present

## 2023-05-05 MED ORDER — NA SULFATE-K SULFATE-MG SULF 17.5-3.13-1.6 GM/177ML PO SOLN: 1.0000 | ORAL | 0 refills | Status: DC

## 2023-05-05 NOTE — Patient Instructions (Signed)
_______________________________________________________  If your blood pressure at your visit was 140/90 or greater, please contact your primary care physician to follow up on this. _______________________________________________________  If you are age 69 or older, your body mass index should be between 23-30. Your Body mass index is 22.24 kg/m. If this is out of the aforementioned range listed, please consider follow up with your Primary Care Provider. ________________________________________________________  The [facility] providers would like to encourage you to use [facility] to communicate with providers for non-urgent requests or questions.  Due to long hold times on the telephone, sending your provider a message by [facility] may be a faster and more efficient way to get a response.  Please allow 48 business hours for a response.  Please remember that this is for non-urgent requests.  _______________________________________________________  Rhonda Gardner have been scheduled for a colonoscopy. Please follow written instructions given to you at your visit today.   Please pick up your prep supplies at the pharmacy within the next 1-3 days.  If you use inhalers (even only as needed), please bring them with you on the day of your procedure.  DO NOT TAKE 7 DAYS PRIOR TO TEST- Trulicity (dulaglutide) Ozempic, Wegovy (semaglutide) Mounjaro (tirzepatide) Bydureon Bcise (exanatide extended release)  DO NOT TAKE 1 DAY PRIOR TO YOUR TEST Rybelsus (semaglutide) Adlyxin (lixisenatide) Victoza (liraglutide) Byetta (exanatide) ___________________________________________________________________________  Due to recent changes in healthcare laws, you may see the results of your imaging and laboratory studies on MyChart before your provider has had a chance to review them.  We understand that in some cases there may be results that are confusing or concerning to you. Not all laboratory results come back in the  same time frame and the provider may be waiting for multiple results in order to interpret others.  Please give [place] 48 hours in order for your provider to thoroughly review all the results before contacting the office for clarification of your results.   Thank you for entrusting me with your care and choosing [facility].  Amy Esterwood, PA-C

## 2023-05-05 NOTE — Progress Notes (Signed)
Subjective:    Patient ID: Rhonda Gardner, female    DOB: [birth year], 67 y.o.   MRN: [id number]  HPI  Rhonda "Rhonda Gardner" is a pleasant 67 year old Caucasian female, who had been seen remotely by Dr. Russella Gardner, and last seen here by Dr. Leone Gardner, then Dr. Christella Gardner for EUS. Patient has history of duodenal ulcer, prior history of C. difficile colitis, breast cancer stage II OA, history of chemotherapy-induced neuropathy, prior history of iron deficiency anemia, and follicular lymphoma. She is referred back today by her PCP Dr. Joselyn Gardner after some recent episodes of low-grade hematochezia.  She was seen in May 2024 and diagnosed with internal hemorrhoids.  She says that she has had infrequent sporadic episodes of noticing a small amount of bright red blood on the tissue with bowel movements, she does not think that she has seen any blood mixed in with her bowel movements.  No melena.  No complaints of anorectal pain or discomfort.  The last episode was about a month ago. She last had colonoscopy in 2017 done by Dr. Noelle Gardner in [place] and this was a negative exam other than internal hemorrhoids. She had EGD here in November 2019 per Dr. Leone Gardner for follow-up of a duodenal ulcer and was noted to have oozing at the site of what appeared to be a chronic duodenal ulcer and some oozing of blood at the junction of the papilla and the duodenum.  Biopsies were taken which showed atypical lymphoid infiltrate. She was then set up for EUS which was done by Dr. Christella Gardner on 11/02/2018 with finding of abnormal appearing ampulla which appeared denuded and friable on ultrasound this showed abnormal hypoechoic soft tissue mass measuring about 1.5 cm.  Biopsies were consistent with a follicular lymphoma. She was subsequently seen at [facility] and underwent radiation therapy for eradication.  She had been followed for about 4 years thereafter and has had no evidence of recurrence.  She has no current complaints of  abdominal pain, no changes in bowel habits, no diarrhea or constipation, no melena.  She does have family history of colon cancer in her mother diagnosed in her 27s to 75s.    Review of Systems Pertinent positive and negative review of systems were noted in the above HPI section.  All other review of systems was otherwise negative.   Outpatient Encounter Medications as of 05/05/2023  Medication Sig   Alpha-Lipoic Acid 600 MG TABS Take by mouth.   Calcium-Magnesium (CALCIUM MAGNESIUM 750) 300-300 MG TABS Take by mouth. Bid   carboxymethylcellulose (REFRESH PLUS) 0.5 % SOLN Place 1 drop into both eyes 3 (three) times daily as needed (dry eyes.).   Cholecalciferol (VITAMIN D-3) 125 MCG (5000 UT) TABS Take 5,000 Units by mouth daily.   fluticasone (FLONASE) 50 MCG/ACT nasal spray Place 1 spray into both nostrils daily.   ibuprofen (ADVIL) 200 MG tablet Take 200 mg by mouth every 6 (six) hours as needed.   lamoTRIgine (LAMICTAL) 200 MG tablet Take 1/2 (one-half) tablet by mouth once daily   Lutein-Zeaxanthin 25-5 MG CAPS Take 1 capsule by mouth daily.   Na Sulfate-K Sulfate-Mg Sulf (SUPREP BOWEL PREP KIT) 17.5-3.13-1.6 GM/177ML SOLN Take 1 kit by mouth as directed.   ondansetron (ZOFRAN-ODT) 4 MG disintegrating tablet Take 4 mg by mouth as needed.   promethazine (PHENERGAN) 25 MG tablet Take 25 mg by mouth as needed for nausea.   [DISCONTINUED] hydrocortisone (ANUSOL-HC) 25 MG suppository Place 1 suppository (25 mg total) rectally 2 (two) times daily.  No facility-administered encounter medications on file as of 05/05/2023.   Allergies  Allergen Reactions   Clindamycin/Lincomycin Diarrhea    Resulted in C-diff   Other Nausea Only    Stronger pain meds   Adhesive [Tape] Rash    Pt is sensitive to any kind of adhesive tape products.   Patient Active Problem List   Diagnosis Date Noted   Cognitive deficits 05/16/2020   Visual disturbance 05/16/2020   Sleep disturbance 04/04/2020   Post  concussive syndrome 03/02/2020   Dizziness and giddiness 03/02/2020   Follicular lymphoma (HCC) 11/16/2018   Duodenum disorder    Upper gastrointestinal bleed 07/02/2018   Abdominal pain, epigastric 07/01/2018   Elevated lipase 07/01/2018   Elevated LFTs 07/01/2018   Iron deficiency anemia 08/17/2017   C. 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 01/25/2015   Malignant neoplasm of upper-outer quadrant of left breast in female, estrogen receptor negative (HCC) 12/22/2014   Social History   Socioeconomic History   Marital status: Married    Spouse name: Not on file   Number of children: 0   Years of education: Not on file   Highest education level: Not on file  Occupational History   Occupation: retired  Tobacco Use   Smoking status: Never   Smokeless tobacco: Never  Vaping Use   Vaping status: Never Used  Substance and Sexual Activity   Alcohol use: Yes    Alcohol/week: 2.0 standard drinks of alcohol    Types: 2 Standard drinks or equivalent per week    Comment: 2 glasses of wine/week   Drug use: No   Sexual activity: Yes    Partners: Male    Comment: postmenopausal  Other Topics Concern   Not on file  Social History Narrative   Married Immunologist) (1 dog (chocolate lab) passed away in 05/28/2019)    Homemaker      Updated 04/2022   Social Determinants of Health   Financial Resource Strain: Not on file  Food Insecurity: Not on file  Transportation Needs: Not on file  Physical Activity: Not on  file  Stress: Not on file  Social Connections: Not on file  Intimate Partner Violence: Not on file    Rhonda Gardner's family history includes Arthritis in her mother; Blindness in her sister; Breast cancer (age of onset: 7) in her sister; Cancer (age of onset: 35) in her paternal aunt; Colon cancer (age of onset: 62) in her mother; Diabetes in her paternal aunt; Epilepsy in her sister; Goiter in her paternal aunt; Heart disease in her father; Hypertension in her mother; Lung cancer in her maternal aunt; Macular degeneration in her mother; Mental retardation in her sister; Osteoporosis in her sister and sister; Skin cancer in her father.      Objective:    Vitals:   05/05/23 1031  BP: 100/70  Pulse: 75    Physical Exam Well-developed well-nourished WF in no acute distress.  Height, LKGMWN,027 BMI 22.24  HEENT; nontraumatic normocephalic, EOMI, PE R LA, sclera anicteric. Oropharynx;  not examined Neck; supple, no JVD Cardiovascular; regular rate and rhythm with S1-S2, no murmur rub or gallop Pulmonary; Clear bilaterally Abdomen; soft, nontender, nondistended, no palpable mass or hepatosplenomegaly, bowel sounds are active Rectal;not done Skin; benign exam, no jaundice rash or appreciable lesions Extremities; no clubbing cyanosis or edema skin warm and dry Neuro/Psych; alert and oriented x4, grossly nonfocal mood and affect appropriate        Assessment & Plan:   #81 67 year old white female with recent sporadic low-grade hematochezia noting bright red blood primarily on the tissue with bowel movements.  No associated change in bowel habits, no anorectal discomfort, no abdominal pain. Patient did have internal hemorrhoids documented at the time of last colonoscopy in 2017 done elsewhere.  (Dr. Rennie Plowman)  Her current sporadic episodes may be secondary to internal hemorrhoids however she is indicated for follow-up colonoscopy especially in setting of family history of  colon cancer in her mother.  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  Prior history of C. difficile colitis 5.  Chemotherapy-induced neuropathy 6.  Prior history of iron deficiency anemia  Plan; patient will be scheduled for colonoscopy with Dr. Lavon Paganini  (patient requested -younger MD) -procedure was discussed in detail with the patient including indications risk and benefits and she is agreeable to proceed. Further recommendations pending findings at colonoscopy.  If bleeding is found to be secondary to internal hemorrhoids then hemorrhoidal banding can be considered.  Rhonda Cooper PA-C 05/05/2023   Cc: Rhonda Arrow, MD

## 2023-05-17 NOTE — Progress Notes (Unsigned)
No chief complaint on file.   Rhonda Gardner is a 67 y.o. female who presents for Medicare Annual Wellness visit and follow-up on chronic medical conditions.    Intermittent rectal bleeding, known h/o hemorrhoids. She was seen here in May, and anoscopy revealed inflamed internal hemorrhoid. She was treated with anusol HC. She recently saw GI, and is scheduled for colonoscopy with Dr. Lavon Paganini in September.  Will consider hemorrhoid banding if that is found to be the source of her bleeding.  H/o duodenal, low-grade follicular lymphoma, diagnosed 10/2018.  She was treated with radiation.  Post-treatment endoscopy and biopsy showed no evidence of residual lymphoma. She last saw Dr. Adelina Mings in follow up in 09/2022, and was released from care (to f/u prn).   H/o skull fracture 02/2019. She has had changes to smell and taste, along with aphasia, balance problems. She had seen neurosurgeon, diagnosed with post-concussive syndrome. She completed rehab, balance improved. Last year she had reported some recurrent balance problems, and fatigue, mainly feeling unsteady and exhausted after a lot of activity. She was sent back to PT, and referred to neuro. She saw Dr. Marjory Lies. MRI in 07/2022 was unremarkable. She had reported some ongoing issues with taste, that chocolate, coffee and peanut butter tasted bad to her.  H/o breast Cancer: She had been under the care of Dr. Darnelle Catalan, now released to PCP care.  She last saw Dr. Carolynne Edouard in 08/2021. Last mammogram was 02/2023. Denies any breast concerns.   Osteoporosis:  This had been managed by Dr. Darnelle Catalan with zoledronate infusions, every 6 months. Per chart, appears to have been started 08/2016, last received 04/2020.  DEXA in 06/2018 showed T-1.8 at spine.  Dr. Darnelle Catalan had said to hold off on next dose until next DEXA, which was done 01/2021. This showed slight decline in spine, T-2.1 (up from T-1.8 in 2019, had been T-2.5 in 06/2016).  We had forwarded copy to  Dr. Darnelle Catalan, pt reported last year not hearing anything re: recommendations. He has retired. She was due to have DEXA done this Spring, along with mammo.   She needs to schedule this ***UPDATE  She takes Calcium tablet twice daily, drinks milk daily, and eats kale most days.  Takes 3000 IU of vitamin D3 currently (sometimes takes 5000 IU, depending what she buys).  Bipolar disorder:  Diagnosed age 69 or 1.  She has been stable on her current regimen of lamictal.  Moods are well controlled, no side effects.  She previously was under the care of Dr. Emerson Monte and Valinda Hoar (who retired).  We have been managing these medications, and she continues to do well. She had some issues with certain generic formulations in the past (had myalgias), but has been doing well. She reports her moods are good.  Subclinical hypothyroidism:  TSH was mildly elevated at 5.270 in the past (04/2020).  Recheck was normal, and TPO was negative. She denies changes in hair/skin/bowels/moods/weight. Fatigue as above (easily wiped out)  Lab Results  Component Value Date   TSH 4.210 05/06/2022     Immunization History  Administered Date(s) Administered   DTaP 10/14/1954, 10/15/1955, 10/15/1959   Fluad Quad(high Dose 65+) 07/08/2019, 07/10/2020, 08/19/2022   Gamma Globulin 08/23/1988   Hepatitis A 11/02/1996, 09/29/2002   Hepatitis A, Adult 11/02/1996, 09/29/2002   Hepatitis B 11/11/2007   Hepatitis B, ADULT 11/11/2007   IPV 08/15/1988   Influenza Split 11/11/2007, 07/13/2013   Influenza,inj,Quad PF,6+ Mos 05/30/2016, 07/17/2017, 07/04/2018   Influenza-Unspecified 07/31/2015, 05/30/2016, 07/17/2017  MMR 04/29/2003   Measles 07/25/1988   Meningococcal polysaccharide vaccine (MPSV4) 07/08/1988   PFIZER Comirnaty(Gray Top)Covid-19 Tri-Sucrose Vaccine 11/06/2019, 11/27/2019   PFIZER(Purple Top)SARS-COV-2 Vaccination 11/06/2019, 11/27/2019, 07/10/2020   Pneumococcal Conjugate-13 04/08/2016   Pneumococcal  Polysaccharide-23 04/27/2020   Rubella 07/25/1988   Td 10/15/1987, 11/02/1996, 01/23/1998   Td (Adult), 2 Lf Tetanus Toxid, Preservative Free 10/15/1987, 11/02/1996, 01/23/1998   Tdap 11/11/2007, 01/20/2013   Typhoid Live 11/11/2007   Typhoid Parenteral 07/25/1988, 08/23/1988   Unspecified SARS-COV-2 Vaccination 11/06/2019, 11/27/2019   Zoster Recombinant(Shingrix) 12/04/2017, 02/19/2018   Zoster, Live 12/01/2014   Last Pap smear:04/2019--normal, no high risk HPV detected Last mammogram: 02/2023 Last colonoscopy:06/2016--normal, internal hemorrhoids. Scheduled for September 2024 Last DEXA: 01/2021 T-2.1 spine (06/2018 T-1.8 at spine, improved from 06/2016 T-2.5) Dentist: every 6 months Ophtho: overdue, has been a few years. UPDATE Exercise:   Walks up and down the driveway, 20 minutes 3x/week. (Last year she reported walking 5x/week with a neighbor, x 1 hr, unable to do this now. Also used to walk with weights 2x/week.) UPDATE  Vitamin D-OH 48.8 in 04/2020 Lipids: Lab Results  Component Value Date   CHOL 212 (H) 04/27/2020   HDL 106 04/27/2020   LDLCALC 89 04/27/2020   TRIG 99 04/27/2020   CHOLHDL 2.0 04/27/2020   Patient Care Team: Joselyn Arrow, MD as PCP - General (Family Medicine) Magrinat, Valentino Hue, MD (Inactive) as Consulting Physician (Oncology) Pershing Proud, RN as Registered Nurse Donnelly Angelica, RN as Registered Nurse Griselda Miner, MD as Consulting Physician (General Surgery) Wanita Chamberlain, MD as Referring Physician (Hematology and Oncology) Theodora Blow, MD as Physician Assistant (Hematology and Oncology) Shana Chute, Prudencio Pair, MD as Referring Physician (Internal Medicine) Newman Pies, MD as Consulting Physician (Otolaryngology) Donalee Citrin, MD as Consulting Physician (Neurosurgery) Lenn Sink, DPM as Consulting Physician (Podiatry) Elray Buba, DMD (Dentistry) Janet Berlin, MD as Consulting Physician (Ophthalmology) Sinda Du, MD  as Consulting Physician (Ophthalmology) [place], Amy, MD as Consulting Physician (Dermatology)  Dr. Marjory Lies (neurology, seen 06/2022) Dr. Adelina Mings (oncologist at [facility]) Dr. Jearld Fenton-- in the past for cerumen impaction Dr. Lavon Paganini (scheduled to see in September; previously saw Dr. Russella Dar and Dr. Leone Payor)  Depression Screening:     05/06/2022    9:52 AM 04/30/2021    9:48 AM 05/16/2020   10:43 AM 04/27/2020    8:44 AM 04/04/2020    9:59 AM  Depression screen PHQ 2/9  Decreased Interest 0 0 0 0 0  Down, Depressed, Hopeless 1 0 0 0 0  PHQ - 2 Score 1 0 0 0 0  Altered sleeping 0    0  Tired, decreased energy 1    0  Change in appetite 2    0  Feeling bad or failure about yourself  0    0  Trouble concentrating 1    0  Moving slowly or fidgety/restless 0    0  Suicidal thoughts 0    0  PHQ-9 Score 5    0  Difficult doing work/chores Somewhat difficult        Falls screen:     02/20/2023    2:26 PM 08/19/2022    9:29 AM 05/06/2022    9:50 AM 04/30/2021    9:48 AM 05/16/2020   10:42 AM  Fall Risk   Falls in the past year? 0 0 0 0 0  Number falls in past yr: 0 0 0 0   Injury with Fall? 0 0 0 0  Risk for fall due to : No Fall Risks No Fall Risks No Fall Risks No Fall Risks   Follow up Falls evaluation completed Falls evaluation completed Falls evaluation completed Falls evaluation completed     Functional Status Survey:          End of Life Discussion:  Patient has a living will and medical power of attorney, in chart.   PMH, PSH, SH and FH were reviewed and updated    ROS:  The patient denies anorexia, fever, headaches,  decreased hearing (not as good as when younger, no recent change), ear pain, sore throat, breast concerns, chest pain, palpitations, syncope, dyspnea on exertion, cough, swelling, nausea, vomiting, diarrhea, constipation, abdominal pain, melena, hematochezia, indigestion/heartburn, hematuria, incontinence, dysuria, vaginal bleeding, discharge, odor or itch, genital  lesions, joint pains, numbness, tingling, weakness, tremor, suspicious skin lesions, depression, anxiety, abnormal bleeding/bruising, or enlarged lymph nodes. Vaginal dryness with intercourse, controlled with lubricants Nocturia 3-4x (related to fluids at night), unchanged. Blurred and double vision when reading a lot (has known astygmatism). Denies vertigo or lightheadedness. Fatigue, balance problems? Abnormal taste?   PHYSICAL EXAM:  There were no vitals taken for this visit.  Wt Readings from Last 3 Encounters:  05/05/23 142 lb (64.4 kg)  02/20/23 144 lb 6.4 oz (65.5 kg)  08/19/22 140 lb 12.8 oz (63.9 kg)    General Appearance:     Alert, cooperative, no distress, appears stated age, in good spirits.  Head:     Normocephalic, without obvious abnormality, atraumatic   Eyes:     PERRL, conjunctiva/corneas clear, EOM's intact, fundi  benign   Ears:     Normal TM's and external ear canals.   Nose:    no drainage or sinus tenderness  Throat:    Normal mucosa, no lesions  Neck:    Supple, no lymphadenopathy;  thyroid:  no enlargement/ tenderness/nodules; no carotid bruit or JVD   Back:     Spine nontender, no curvature, ROM normal, no CVA tenderness   Lungs:      Clear to auscultation bilaterally without wheezes, rales or ronchi; respirations unlabored   Chest Wall:     No tenderness or deformity. WHSS R upper chest.   Heart:     Regular rate and rhythm, S1 and S2 normal, no murmur, rub   or gallop   Breast Exam:     No tenderness, nipple discharge or inversion. No axillary lymphadenopathy. WHSS at left lateral breast. No masses.  Abdomen:      Soft, non-tender, nondistended, normoactive bowel sounds, no masses, no hepatosplenomegaly   Genitalia:     Normal external genitalia without lesions, mild atrophic changes.  BUS and vagina normal; no cervical motion tenderness. No abnormal vaginal discharge. Pap not obtained. Uterus and adnexa not enlarged, nontender, no masses.  Rectal:      Normal tone, no masses or tenderness; heme negative stool  Extremities:    No clubbing, cyanosis or edema   Pulses:    2+ and symmetric all extremities   Skin:    Skin color, texture, turgor normal, no rashes or lesions.  Lymph nodes:    Cervical, supraclavicular, inguinal and axillary nodes normal   Neurologic:    Normal strength, sensation and gait; reflexes 2+ and symmetric throughout. Cranial nerves grossly intact.  Normal finger to nose, rapid alternating movement.  Limited gait assessment is normal.                Psych:   Normal mood,  affect, hygiene and grooming   ASSESSMENT/PLAN:  HAPPY BIRTHDAY!  Dexa was due in 01/2023. Did she get this with her mammo from [place]?  Not received. If not done, she should schedule.  Prevnar-20 Did she get RSV? Needs Tdap from pharmacy  Vitamin D only if change in supplements, normal last year (or if worsening bone density--) TSH if sx  ***ENTER CODE/CHARGE FOR PELVIC THIS YEAR  Discussed monthly self breast exams and yearly mammograms; at least 30 minutes of aerobic activity at least 5 days/week, weight-bearing exercise at least 2x/week; proper sunscreen use reviewed; healthy diet, including goals of calcium and vitamin D intake and alcohol recommendations (less than or equal to 1 drink/day) reviewed; regular seatbelt use; changing batteries in smoke detectors.  Immunization recommendations discussed--continue yearly flu shots. Updated bivalent COVID booster is recommended when available in the Fall.   RSV vaccine is recommended, to get from the pharmacy in the Fall.   Prevnar-20  Tdap is due, to get from pharmacy Colonoscopy recommendations reviewed-- scheduled for 06/2023 with Dr. Lavon Paganini. DEXA 01/2023  MOST form reviewed and completed, Full Code, Full Care.    Medicare Attestation I have personally reviewed: [name] medical and social history Their use of alcohol, tobacco or illicit drugs Their current medications and  supplements [name] functional ability including ADLs,fall risks, home safety risks, cognitive, and hearing and visual impairment Diet and physical activities Evidence for depression or mood disorders  [name] weight, height, BMI have been recorded in the chart.  I have made referrals, counseling, and provided education to the patient based on review of the above and I have provided the patient with a written personalized care plan for preventive services.

## 2023-05-17 NOTE — Patient Instructions (Incomplete)
  HEALTH MAINTENANCE RECOMMENDATIONS:  It is recommended that you get at least 30 minutes of aerobic exercise at least 5 days/week (for weight loss, you may need as much as 60-90 minutes). This can be any activity that gets your heart rate up. This can be divided in 10-15 minute intervals if needed, but try and build up your endurance at least once a week.  Weight bearing exercise is also recommended twice weekly.  Eat a healthy diet with lots of vegetables, fruits and fiber.  "Colorful" foods have a lot of vitamins (ie green vegetables, tomatoes, red peppers, etc).  Limit sweet tea, regular sodas and alcoholic beverages, all of which has a lot of calories and sugar.  Up to 1 alcoholic drink daily may be beneficial for women (unless trying to lose weight, watch sugars).  Drink a lot of water.  Calcium recommendations are 1200-1500 mg daily (1500 mg for postmenopausal women or women without ovaries), and vitamin D 1000 IU daily.  This should be obtained from diet and/or supplements (vitamins), and calcium should not be taken all at once, but in divided doses.  Monthly self breast exams and yearly mammograms for women over the age of 46 is recommended.  Sunscreen of at least SPF 30 should be used on all sun-exposed parts of the skin when outside between the hours of 10 am and 4 pm (not just when at beach or pool, but even with exercise, golf, tennis, and yard work!)  Use a sunscreen that says "broad spectrum" so it covers both UVA and UVB rays, and make sure to reapply every 1-2 hours.  Remember to change the batteries in your smoke detectors when changing your clock times in the spring and fall. Carbon monoxide detectors are recommended for your home.  Use your seat belt every time you are in a car, and please drive safely and not be distracted with cell phones and texting while driving.   Rhonda Gardner , Thank you for taking time to come for your Medicare Wellness Visit. I appreciate your ongoing  commitment to your health goals. Please review the following plan we discussed and let me know if I can assist you in the future.   This is a list of the screening recommended for you and due dates:  Health Maintenance  Topic Date Due   COVID-19 Vaccine (8 - 2023-24 season) 06/14/2022   DTaP/Tdap/Td vaccine (12 - Td or Tdap) 01/21/2023   Flu Shot  05/15/2023   Mammogram  02/12/2024   Medicare Annual Wellness Visit  05/18/2024   Colon Cancer Screening  07/08/2026   Pneumonia Vaccine  Completed   DEXA scan (bone density measurement)  Completed   Hepatitis C Screening  Completed   Zoster (Shingles) Vaccine  Completed   HPV Vaccine  Aged Out   Please schedule bone density test with [facility] (they should send me the order to sign, they should be able to schedule it for you).  I recommend getting the updated COVID booster when it becomes available in the Fall. Continue to get yearly high dose flu shots in the Fall.  RSV vaccine is recommended in the Fall. You need to get this from the pharmacy. Continue yearly high dose flu shots in the Fall.  Please get your tetanus booster (TdaP) from the pharmacy. Wait 2 weeks from today's vaccine.  Please remember to get your weight-bearing exercise at least 2x/week.

## 2023-05-19 ENCOUNTER — Ambulatory Visit (INDEPENDENT_AMBULATORY_CARE_PROVIDER_SITE_OTHER): Payer: Medicare Other | Admitting: Family Medicine

## 2023-05-19 ENCOUNTER — Encounter: Payer: Self-pay | Admitting: Family Medicine

## 2023-05-19 VITALS — BP 112/70 | HR 64 | Ht 66.5 in | Wt 140.6 lb

## 2023-05-19 DIAGNOSIS — Z5181 Encounter for therapeutic drug level monitoring: Secondary | ICD-10-CM

## 2023-05-19 DIAGNOSIS — Z8781 Personal history of (healed) traumatic fracture: Secondary | ICD-10-CM

## 2023-05-19 DIAGNOSIS — F3170 Bipolar disorder, currently in remission, most recent episode unspecified: Secondary | ICD-10-CM

## 2023-05-19 DIAGNOSIS — R4189 Other symptoms and signs involving cognitive functions and awareness: Secondary | ICD-10-CM

## 2023-05-19 DIAGNOSIS — Z853 Personal history of malignant neoplasm of breast: Secondary | ICD-10-CM | POA: Diagnosis not present

## 2023-05-19 DIAGNOSIS — M818 Other osteoporosis without current pathological fracture: Secondary | ICD-10-CM

## 2023-05-19 DIAGNOSIS — Z23 Encounter for immunization: Secondary | ICD-10-CM | POA: Diagnosis not present

## 2023-05-19 DIAGNOSIS — J309 Allergic rhinitis, unspecified: Secondary | ICD-10-CM

## 2023-05-19 DIAGNOSIS — Z8572 Personal history of non-Hodgkin lymphomas: Secondary | ICD-10-CM | POA: Diagnosis not present

## 2023-05-19 DIAGNOSIS — Z01419 Encounter for gynecological examination (general) (routine) without abnormal findings: Secondary | ICD-10-CM | POA: Diagnosis not present

## 2023-05-19 DIAGNOSIS — K648 Other hemorrhoids: Secondary | ICD-10-CM

## 2023-05-19 DIAGNOSIS — Z0000 Encounter for general adult medical examination without abnormal findings: Secondary | ICD-10-CM

## 2023-05-19 LAB — CBC WITH DIFFERENTIAL/PLATELET
Basophils Absolute: 0.1 10*3/uL (ref 0.0–0.2)
Basos: 1 %
EOS (ABSOLUTE): 0.1 10*3/uL (ref 0.0–0.4)
Eos: 2 %
Hematocrit: 38.7 % (ref 34.0–46.6)
Hemoglobin: 12.7 g/dL (ref 11.1–15.9)
Immature Grans (Abs): 0 10*3/uL (ref 0.0–0.1)
Immature Granulocytes: 0 %
Lymphocytes Absolute: 0.8 10*3/uL (ref 0.7–3.1)
Lymphs: 15 %
MCH: 30.8 pg (ref 26.6–33.0)
MCHC: 32.8 g/dL (ref 31.5–35.7)
MCV: 94 fL (ref 79–97)
Monocytes Absolute: 0.4 10*3/uL (ref 0.1–0.9)
Monocytes: 8 %
Neutrophils Absolute: 4 10*3/uL (ref 1.4–7.0)
Neutrophils: 74 %
Platelets: 193 10*3/uL (ref 150–450)
RBC: 4.13 x10E6/uL (ref 3.77–5.28)
RDW: 12.3 % (ref 11.7–15.4)
WBC: 5.5 10*3/uL (ref 3.4–10.8)

## 2023-05-19 LAB — COMPREHENSIVE METABOLIC PANEL
ALT: 20 IU/L (ref 0–32)
AST: 21 IU/L (ref 0–40)
Albumin: 4.6 g/dL (ref 3.9–4.9)
Alkaline Phosphatase: 59 IU/L (ref 44–121)
BUN/Creatinine Ratio: 13 (ref 12–28)
BUN: 12 mg/dL (ref 8–27)
Bilirubin Total: 0.3 mg/dL (ref 0.0–1.2)
CO2: 25 mmol/L (ref 20–29)
Calcium: 9.6 mg/dL (ref 8.7–10.3)
Chloride: 106 mmol/L (ref 96–106)
Creatinine, Ser: 0.91 mg/dL (ref 0.57–1.00)
Globulin, Total: 2 g/dL (ref 1.5–4.5)
Glucose: 93 mg/dL (ref 70–99)
Potassium: 4.6 mmol/L (ref 3.5–5.2)
Sodium: 143 mmol/L (ref 134–144)
Total Protein: 6.6 g/dL (ref 6.0–8.5)
eGFR: 68 mL/min/{1.73_m2} (ref >59)

## 2023-06-30 ENCOUNTER — Telehealth (INDEPENDENT_AMBULATORY_CARE_PROVIDER_SITE_OTHER): Payer: Medicare Other | Admitting: Family Medicine

## 2023-06-30 ENCOUNTER — Encounter: Payer: Self-pay | Admitting: Family Medicine

## 2023-06-30 VITALS — Temp 98.6°F | Ht 66.5 in | Wt 140.0 lb

## 2023-06-30 DIAGNOSIS — U071 COVID-19: Secondary | ICD-10-CM | POA: Diagnosis not present

## 2023-06-30 MED ORDER — NIRMATRELVIR/RITONAVIR (PAXLOVID)TABLET: 3.0000 | ORAL_TABLET | Freq: Two times a day (BID) | ORAL | 0 refills | Status: AC

## 2023-06-30 NOTE — Progress Notes (Signed)
Start time: 12:30 End time: 12:50  Virtual Visit via Video Note  I connected with Rhonda Gardner on 06/30/23 by a video enabled telemedicine application and verified that I am speaking with the correct person using two identifiers.  Location: Patient: home Provider: office   I discussed the limitations of evaluation and management by telemedicine and the availability of in person appointments. The patient expressed understanding and agreed to proceed.  History of Present Illness:  Chief Complaint  Patient presents with   Covid Positive    VIRTUAL positive covid today, symptoms started Sat. Would like to get started on Paxlovid. Flying to [place] on Friday to see Ted's father and would like if this is safe.    9/14 she started with some chills (no fever), sneezing, body aches, headache, runny nose, fatigue. Just occasional bouts of coughing, not too bad.  Taking tylenol and ibuprofen Not needing other OTC meds. Nasal drainage is clear, only occasionally thick. Cough is nonproductive for the most-part. Colorless.  Husband had COVID last week, and treated with Paxlovid. Pt previously took paxlovid without adverse effects.  They are supposed to be leaving 9/20 to visit Ted's dad in [facility], wondering if she can go.  PMH, PSH, SH reviewed  Outpatient Encounter Medications as of 06/30/2023  Medication Sig Note   Alpha-Lipoic Acid 600 MG TABS Take by mouth.    Calcium-Magnesium (CALCIUM MAGNESIUM 750) 300-300 MG TABS Take by mouth. Bid    Cholecalciferol (VITAMIN D-3) 125 MCG (5000 UT) TABS Take 5,000 Units by mouth daily. 05/19/2023: Currently taking 5000 IU daily   lamoTRIgine (LAMICTAL) 200 MG tablet Take 1/2 (one-half) tablet by mouth once daily    Lutein-Zeaxanthin 25-5 MG CAPS Take 1 capsule by mouth daily.    nirmatrelvir/ritonavir (PAXLOVID) 20 x 150 MG & 10 x 100MG  TABS Take 3 tablets by mouth 2 (two) times daily for 5 days. (Take nirmatrelvir 150 mg two tablets twice  daily for 5 days and ritonavir 100 mg one tablet twice daily for 5 days) Patient GFR is >60    carboxymethylcellulose (REFRESH PLUS) 0.5 % SOLN Place 1 drop into both eyes 3 (three) times daily as needed (dry eyes.). (Patient not taking: Reported on 05/19/2023) 05/19/2023: As needed   fluticasone (FLONASE) 50 MCG/ACT nasal spray Place 1 spray into both nostrils daily. (Patient not taking: Reported on 06/30/2023) 06/30/2023: As needed   ibuprofen (ADVIL) 200 MG tablet Take 200 mg by mouth every 6 (six) hours as needed. (Patient not taking: Reported on 05/19/2023) 06/30/2023: As needed   Na Sulfate-K Sulfate-Mg Sulf (SUPREP BOWEL PREP KIT) 17.5-3.13-1.6 GM/177ML SOLN Take 1 kit by mouth as directed. (Patient not taking: Reported on 05/19/2023)    [DISCONTINUED] ondansetron (ZOFRAN-ODT) 4 MG disintegrating tablet Take 4 mg by mouth as needed. (Patient not taking: Reported on 05/19/2023) 05/19/2023: As needed   [DISCONTINUED] promethazine (PHENERGAN) 25 MG tablet Take 25 mg by mouth as needed for nausea. (Patient not taking: Reported on 05/19/2023) 05/19/2023: As needed   No facility-administered encounter medications on file as of 06/30/2023.   NOT taking paxlovid prior to today's visit  Allergies  Allergen Reactions   Clindamycin/Lincomycin Diarrhea    Resulted in C-diff   Other Nausea Only    Stronger pain meds   Adhesive [Tape] Rash    Pt is sensitive to any kind of adhesive tape products.    ROS:  no fever, +chills and URI symptoms per HPI.  No n/v/d, rashes, chest pain, shortness of breath. See  HPI    Observations/Objective:  Temp 98.6 F (37 C) (Oral)   Ht 5' 6.5" (1.689 m)   Wt 140 lb (63.5 kg)   BMI 22.26 kg/m   Pleasant, well-appearing female, in good spirits. She is alert and oriented.  Occasional sniffle during visit, no cough. She is speaking easily and comfortably. Exam is limited due to the virtual nature of the visit.  Chart reviewed-- GFR 68  Assessment and Plan:  COVID-19 virus  infection - Plan: nirmatrelvir/ritonavir (PAXLOVID) 20 x 150 MG & 10 x 100MG  TABS Supportive measures reviewed, risks/SE reviewed, potential interaction reviewed, to double check with pharmacy.  Sudafed or afrin Mucinex if needed Sinus rinses if needed  Stay well hydrated. If you remain afebrile, and respiratory symptoms are significantly improved, you can leave isolation, but should wear a mask for 5 more days. If you decide to go to [place], you may want to use Afrin or take sudafed prior to flying (to help prevent ear pain). Take the paxlovid as directed. Consider adding Mucinex (plain guaifenesin) if mucus/secretions are thick, and/or sinus rinses if needed for sinus pressure. You can use decongestants if needed, as well.     Follow Up Instructions:    I discussed the assessment and treatment plan with the patient. The patient was provided an opportunity to ask questions and all were answered. The patient agreed with the plan and demonstrated an understanding of the instructions.   The patient was advised to call back or seek an in-person evaluation if the symptoms worsen or if the condition fails to improve as anticipated.  I spent 23 minutes dedicated to the care of this patient, including pre-visit review of records, face to face time, post-visit ordering of testing and documentation.    Lavonda Jumbo, MD

## 2023-06-30 NOTE — Patient Instructions (Signed)
Stay well hydrated. If you remain afebrile, and respiratory symptoms are significantly improved, you can leave isolation, but should wear a mask for 5 more days. If you decide to go to [place], you may want to use Afrin or take sudafed prior to flying (to help prevent ear pain). Take the paxlovid as directed. Consider adding Mucinex (plain guaifenesin) if mucus/secretions are thick, and/or sinus rinses if needed for sinus pressure. You can use decongestants if needed, as well.

## 2023-07-11 ENCOUNTER — Encounter: Payer: BLUE CROSS/BLUE SHIELD | Admitting: Gastroenterology

## 2023-07-26 ENCOUNTER — Other Ambulatory Visit: Payer: Self-pay | Admitting: Family Medicine

## 2023-07-26 DIAGNOSIS — F3170 Bipolar disorder, currently in remission, most recent episode unspecified: Secondary | ICD-10-CM

## 2023-08-11 ENCOUNTER — Telehealth: Payer: Self-pay | Admitting: Gastroenterology

## 2023-08-11 ENCOUNTER — Encounter: Payer: Self-pay | Admitting: Gastroenterology

## 2023-08-11 ENCOUNTER — Ambulatory Visit: Payer: Medicare Other | Admitting: Gastroenterology

## 2023-08-11 VITALS — BP 113/60 | HR 90 | Temp 96.4°F | Resp 14 | Ht 67.0 in | Wt 142.0 lb

## 2023-08-11 DIAGNOSIS — Z800 Family history of malignant neoplasm of digestive organs: Secondary | ICD-10-CM

## 2023-08-11 DIAGNOSIS — K921 Melena: Secondary | ICD-10-CM | POA: Diagnosis not present

## 2023-08-11 DIAGNOSIS — D122 Benign neoplasm of ascending colon: Secondary | ICD-10-CM | POA: Diagnosis not present

## 2023-08-11 DIAGNOSIS — D125 Benign neoplasm of sigmoid colon: Secondary | ICD-10-CM

## 2023-08-11 MED ORDER — SODIUM CHLORIDE 0.9 % IV SOLN: 500.0000 mL | INTRAVENOUS | Status: DC

## 2023-08-11 NOTE — Patient Instructions (Signed)
Handouts on Polyps and hemorrhoids given.  Resume previous diet and Continue present medications.  Awaiting pathology results.  YOU HAD AN ENDOSCOPIC PROCEDURE TODAY AT THE [facility]:   Refer to the procedure report that was given to you for any specific questions about what was found during the examination.  If the procedure report does not answer your questions, please call your gastroenterologist to clarify.  If you requested that your care partner not be given the details of your procedure findings, then the procedure report has been included in a sealed envelope for you to review at your convenience later.  YOU SHOULD EXPECT: Some feelings of bloating in the abdomen. Passage of more gas than usual.  Walking can help get rid of the air that was put into your GI tract during the procedure and reduce the bloating. If you had a lower endoscopy (such as a colonoscopy or flexible sigmoidoscopy) you may notice spotting of blood in your stool or on the toilet paper. If you underwent a bowel prep for your procedure, you may not have a normal bowel movement for a few days.  Please Note:  You might notice some irritation and congestion in your nose or some drainage.  This is from the oxygen used during your procedure.  There is no need for concern and it should clear up in a day or so.  SYMPTOMS TO REPORT IMMEDIATELY:  Following lower endoscopy (colonoscopy or flexible sigmoidoscopy):  Excessive amounts of blood in the stool  Significant tenderness or worsening of abdominal pains  Swelling of the abdomen that is new, acute  Fever of 100F or higher  For urgent or emergent issues, a gastroenterologist can be reached at any hour by calling [phone]. Do not use MyChart messaging for urgent concerns.    DIET:  We do recommend a small meal at first, but then you may proceed to your regular diet.  Drink plenty of fluids but you should avoid alcoholic beverages for 24  hours.  ACTIVITY:  You should plan to take it easy for the rest of today and you should NOT DRIVE or use heavy machinery until tomorrow (because of the sedation medicines used during the test).    FOLLOW UP: Our staff will call the number listed on your records the next business day following your procedure.  We will call around 7:15- 8:00 am to check on you and address any questions or concerns that you may have regarding the information given to you following your procedure. If we do not reach you, we will leave a message.     If any biopsies were taken you will be contacted by phone or by letter within the next 1-3 weeks.  Please call us at [phone] if you have not heard about the biopsies in 3 weeks.    SIGNATURES/CONFIDENTIALITY: You and/or your care partner have signed paperwork which will be entered into your electronic medical record.  These signatures attest to the fact that that the information above on your After Visit Summary has been reviewed and is understood.  Full responsibility of the confidentiality of this discharge information lies with you and/or your care-partner.

## 2023-08-11 NOTE — Telephone Encounter (Signed)
error 

## 2023-08-11 NOTE — Progress Notes (Unsigned)
Called to room to assist during endoscopic procedure.  Patient ID and intended procedure confirmed with present staff. Received instructions for my participation in the procedure from the performing physician.  

## 2023-08-11 NOTE — Op Note (Signed)
[facility] Patient Name: Rhonda Gardner Procedure Date: 08/11/2023 1:19 PM MRN: [id number] Endoscopist: Napoleon Form , MD, [id number] Age: 67 Referring MD:  Date of Birth: November 08, 1955 Gender: Female Account #: [id number] Procedure:                Colonoscopy Indications:              Evaluation of unexplained GI bleeding presenting                            with Hematochezia Medicines:                Monitored Anesthesia Care Procedure:                Pre-Anesthesia Assessment:                           - Prior to the procedure, a History and Physical                            was performed, and patient medications and                            allergies were reviewed. [name] tolerance of                            previous anesthesia was also reviewed. The risks                            and benefits of the procedure and the sedation                            options and risks were discussed with the patient.                            All questions were answered, and informed consent                            was obtained. Prior Anticoagulants: The patient has                            taken no anticoagulant or antiplatelet agents. ASA                            Grade Assessment: II - A patient with mild systemic                            disease. After reviewing the risks and benefits,                            the patient was deemed in satisfactory condition to                            undergo the procedure.  After obtaining informed consent, the colonoscope                            was passed under direct vision. Throughout the                            procedure, the patient's blood pressure, pulse, and                            oxygen saturations were monitored continuously. The                            Olympus Scope [id number] was introduced through the                            anus and advanced to the the  cecum, identified by                            appendiceal orifice and ileocecal valve. The                            colonoscopy was performed without difficulty. The                            patient tolerated the procedure well. The quality                            of the bowel preparation was good. The ileocecal                            valve, appendiceal orifice, and rectum were                            photographed. Scope In: 1:29:10 PM Scope Out: 1:58:56 PM Scope Withdrawal Time: 0 hours 23 minutes 24 seconds  Total Procedure Duration: 0 hours 29 minutes 46 seconds  Findings:                 The perianal and digital rectal examinations were                            normal.                           Two mucous-capped polyps were found in the                            ascending colon. The polyps were 12 to 15 mm in                            size. These polyps were removed with a piecemeal                            technique using a cold snare. Resection and  retrieval were complete.                           A 1 mm polyp was found in the ascending colon. The                            polyp was sessile. The polyp was removed with a                            cold biopsy forceps. Resection and retrieval were                            complete.                           A 28 mm polyp was found in the sigmoid colon. The                            polyp was semi-pedunculated. The polyp was removed                            with a hot snare. Resection and retrieval were                            complete. Area was tattooed with an injection of 2                            mL of Spot (carbon black).                           Non-bleeding external and internal hemorrhoids were                            found during retroflexion. The hemorrhoids were                            medium-sized. Complications:            No immediate  complications. Estimated Blood Loss:     Estimated blood loss was minimal. Impression:               - Two 12 to 15 mm polyps in the ascending colon,                            removed piecemeal using a cold snare. Resected and                            retrieved.                           - One 1 mm polyp in the ascending colon, removed                            with a cold biopsy forceps. Resected and retrieved.                           -  One 28 mm polyp in the sigmoid colon, removed                            with a hot snare. Resected and retrieved.                           - Non-bleeding external and internal hemorrhoids. Recommendation:           - Patient has a contact number available for                            emergencies. The signs and symptoms of potential                            delayed complications were discussed with the                            patient. Return to normal activities tomorrow.                            Written discharge instructions were provided to the                            patient.                           - Resume previous diet.                           - Continue present medications.                           - Await pathology results.                           - Repeat colonoscopy date to be determined after                            pending pathology results are reviewed for                            surveillance based on pathology results. Napoleon Form, MD 08/11/2023 2:07:52 PM This report has been signed electronically.

## 2023-08-11 NOTE — Progress Notes (Unsigned)
[facility] History and Physical   Primary Care Physician:  Joselyn Arrow, MD   Reason for Procedure:  Family history of colon cancer  Plan:    Surveillance colonoscopy with possible interventions as needed     HPI: Rhonda Gardner is a very pleasant 67 y.o. female here for surveillance colonoscopy. Intermittent small volume BRBPR from hemorrhoids per patient  The risks and benefits as well as alternatives of endoscopic procedure(s) have been discussed and reviewed. All questions answered. The patient agrees to proceed.    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 difficile colitis    C. 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, treated at [facility] (radiation)   Non Hodgkin's lymphoma (HCC)    Osteoporosis 06/20/2016   T-2.5@spine  on Dexa ([facility])   Plantar fasciitis    PONV (postoperative nausea and vomiting)    Symptomatic anemia 07/16/2017    Past Surgical History:  Procedure Laterality Date   BIOPSY  07/03/2018   Procedure: BIOPSY;  Surgeon: Iva Boop, MD;  Location: [facility];  Service: Endoscopy;;   BIOPSY  09/02/2018   Procedure: BIOPSY;  Surgeon: Iva Boop, MD;  Location: [facility];  Service: Endoscopy;;   BIOPSY  10/22/2018   Procedure: BIOPSY;  Surgeon: Rachael Fee, MD;  Location: [facility];  Service: Endoscopy;;   BREAST BIOPSY Bilateral    benign and a right stereotatic breast biopsy.   COLONOSCOPY      ESOPHAGOGASTRODUODENOSCOPY N/A 07/16/2017   Procedure: ESOPHAGOGASTRODUODENOSCOPY (EGD);  Surgeon: Meryl Dare, MD;  Location: [facility];  Service: Endoscopy;  Laterality: N/A;   ESOPHAGOGASTRODUODENOSCOPY N/A 10/22/2018   Procedure: ESOPHAGOGASTRODUODENOSCOPY (EGD);  Surgeon: Rachael Fee, MD;  Location: [facility];  Service: Endoscopy;  Laterality: N/A;   ESOPHAGOGASTRODUODENOSCOPY (EGD) WITH PROPOFOL N/A 07/03/2018   Procedure: ESOPHAGOGASTRODUODENOSCOPY (EGD) WITH PROPOFOL;  Surgeon: Iva Boop, MD;  Location: [facility];  Service: Endoscopy;  Laterality: N/A;   ESOPHAGOGASTRODUODENOSCOPY (EGD) WITH PROPOFOL N/A 09/02/2018   Procedure: ESOPHAGOGASTRODUODENOSCOPY (EGD) WITH PROPOFOL;  Surgeon: Iva Boop, MD;  Location: [facility];  Service: Endoscopy;  Laterality: N/A;   EUS N/A 10/22/2018   Procedure: UPPER ENDOSCOPIC ULTRASOUND (EUS) RADIAL;  Surgeon: Rachael Fee, MD;  Location: [facility];  Service: Endoscopy;  Laterality: N/A;   fibroid excision  age 85   prolapsed fibroid removed   FINE NEEDLE ASPIRATION  10/22/2018   Procedure: FINE NEEDLE ASPIRATION;  Surgeon: Rachael Fee, MD;  Location: [facility];  Service: Endoscopy;;   FOOT SURGERY Left 2014   Dr. Charlsie Merles   FOOT SURGERY Right 08/2016   Dr. Charlsie Merles --Shortening Osteotomy with Fixation 1st Metatarsal    HAMMER TOE SURGERY Right 12/04/2021   4th toe; Dr. Charlsie Merles   MOUTH SURGERY  06/2017   CYST    PORT-A-CATH REMOVAL N/A 02/15/2016   Procedure: REMOVAL PORT-A-CATH;  Surgeon: Chevis Pretty III, MD;  Location: [facility];  Service: General;  Laterality: N/A;   RADIOACTIVE SEED GUIDED PARTIAL MASTECTOMY WITH AXILLARY SENTINEL LYMPH NODE BIOPSY Left 05/25/2015   Procedure: LEFT BREAST LUMPECTOMY WITH RADIOACTIVE SEED AND SENTINEL LYMPH NODE BIOPSY;  Surgeon: Chevis Pretty III, MD;  Location: [facility];  Service: General;  Laterality: Left;   UPPER GASTROINTESTINAL ENDOSCOPY       Prior to Admission medications   Medication Sig Start Date End Date Taking? Authorizing Provider  Alpha-Lipoic Acid 600 MG TABS Take by mouth.   Yes [provider]  Calcium-Magnesium (CALCIUM MAGNESIUM 750) 300-300 MG TABS Take by mouth. Bid   Yes [provider]  carboxymethylcellulose (REFRESH PLUS) 0.5 % SOLN Place 1 drop into both eyes 3 (three) times daily as needed (dry eyes.).   Yes [provider]  Cholecalciferol (VITAMIN D-3) 125 MCG (5000 UT) TABS Take 5,000 Units by mouth daily.   Yes [provider]  lamoTRIgine (LAMICTAL) 200 MG tablet Take 1/2 (one-half) tablet by mouth once daily 07/28/23  Yes Joselyn Arrow, MD  Lutein-Zeaxanthin 25-5 MG CAPS Take 1 capsule by mouth daily.   Yes [provider]  fluticasone (FLONASE) 50 MCG/ACT nasal spray Place 1 spray into both nostrils daily.    [provider]  ibuprofen (ADVIL) 200 MG tablet Take 200 mg by mouth every 6 (six) hours as needed.    [provider]  naproxen sodium (ALEVE) 220 MG tablet Take by mouth. Patient not taking: Reported on 08/11/2023 06/20/16   [provider]    Current Outpatient Medications  Medication Sig Dispense Refill   Alpha-Lipoic Acid 600 MG TABS Take by mouth.     Calcium-Magnesium (CALCIUM MAGNESIUM 750) 300-300 MG TABS Take by mouth. Bid     carboxymethylcellulose (REFRESH PLUS) 0.5 % SOLN Place 1 drop into both eyes 3 (three) times daily as needed (dry eyes.).     Cholecalciferol (VITAMIN D-3) 125 MCG (5000 UT) TABS Take 5,000 Units by mouth daily.     lamoTRIgine (LAMICTAL) 200 MG tablet Take 1/2 (one-half) tablet by mouth once daily 45 tablet 1   Lutein-Zeaxanthin 25-5 MG CAPS Take 1 capsule by mouth daily.     fluticasone (FLONASE) 50 MCG/ACT nasal spray Place 1 spray into both nostrils daily.     ibuprofen (ADVIL) 200 MG tablet Take 200 mg by mouth every 6 (six) hours as needed.     naproxen sodium (ALEVE) 220 MG tablet Take  by mouth. (Patient not taking: Reported on 08/11/2023)     Current Facility-Administered Medications  Medication Dose Route Frequency Provider Last Rate Last Admin   0.9 %  sodium chloride infusion  500 mL Intravenous Continuous Malone Admire, Eleonore Chiquito, MD        Allergies as of 08/11/2023 - Review Complete 08/11/2023  Allergen Reaction Noted   Clindamycin/lincomycin Diarrhea 09/12/2017   Other Nausea Only 06/30/2018   Adhesive [tape] Rash 01/25/2015    Family History  Problem Relation Age of Onset   Arthritis Mother        osteoarthritis   Macular degeneration Mother        wet and dry   Colon cancer Mother 61       colon cancer at age 73   Hypertension Mother        resolved with weight loss   Heart disease Father    Skin cancer Father        non melanoma - farmer   Osteoporosis Sister        osteopenia (states  it resolved)   Mental retardation Sister    Blindness Sister        secondary to retrolental fibroplasia   Osteoporosis Sister    Breast cancer Sister 9       Paget's disease   Epilepsy Sister    Lung cancer Maternal Aunt        lung cancer   Cancer Paternal Aunt 25       ? stomach cancer   Goiter Paternal Aunt    Diabetes Paternal Aunt    Esophageal cancer Neg Hx    Rectal cancer Neg Hx    Stomach cancer Neg Hx     Social History   Socioeconomic History   Marital status: Married    Spouse name: Not on file   Number of children: 0   Years of education: Not on file   Highest education level: Not on file  Occupational History   Occupation: retired  Tobacco Use   Smoking status: Never   Smokeless tobacco: Never  Vaping Use   Vaping status: Never Used  Substance and Sexual Activity   Alcohol use: Yes    Alcohol/week: 2.0 standard drinks of alcohol    Types: 2 Standard drinks or equivalent per week    Comment: 2 glasses of wine/week   Drug use: No   Sexual activity: Yes    Partners: Male    Comment: postmenopausal  Other Topics Concern   Not on  file  Social History Narrative   Married Immunologist) (1 dog (chocolate lab) passed away in 2019/09/15)    Homemaker      Updated 05/2023   Social Determinants of Health   Financial Resource Strain: Patient Declined (05/19/2023)   Overall Financial Resource Strain (CARDIA)    Difficulty of Paying Living Expenses: Patient declined  Food Insecurity: Patient Declined (05/19/2023)   Hunger Vital Sign    Worried About Running Out of Food in the Last Year: Patient declined    Ran Out of Food in the Last Year: Patient declined  Transportation Needs: Patient Declined (05/19/2023)   PRAPARE - Administrator, Civil Service (Medical): Patient declined    Lack of Transportation (Non-Medical): Patient declined  Physical Activity: Patient Declined (05/19/2023)   Exercise Vital Sign    Days of Exercise per Week: Patient declined    Minutes of Exercise per Session: Patient declined  Stress: Patient Declined (05/19/2023)   [employer] of Occupational Health - Occupational Stress Questionnaire    Feeling of Stress : Patient declined  Social Connections: Patient Declined (05/19/2023)   Social Connection and Isolation Panel [NHANES]    Frequency of Communication with Friends and Family: Patient declined    Frequency of Social Gatherings with Friends and Family: Patient declined    Attends Religious Services: Patient declined    Database administrator or Organizations: Patient declined    Attends Banker Meetings: Patient declined    Marital Status: Patient declined  Intimate Partner Violence: Patient Declined (05/19/2023)   Humiliation, Afraid, Rape, and Kick questionnaire    Fear of Current or Ex-Partner: Patient declined    Emotionally Abused: Patient declined    Physically Abused: Patient declined    Sexually Abused: Patient declined    Review of Systems:  All other review of systems negative except as mentioned in the HPI.  Physical Exam: Vital signs in last 24 hours: BP (!) 145/84    Pulse 80   Temp (!) 96.4 F (35.8 C)  Resp 15   Ht 5\' 7"  (1.702 m)   Wt 142 lb (64.4 kg)   SpO2 100%   BMI 22.24 kg/m  General:   Alert, NAD Lungs:  Clear .   Heart:  Regular rate and rhythm Abdomen:  Soft, nontender and nondistended. Neuro/Psych:  Alert and cooperative. Normal mood and affect. A and O x 3  Reviewed labs, radiology imaging, old records and pertinent past GI work up  Patient is appropriate for planned procedure(s) and anesthesia in an ambulatory setting   K. Scherry Ran , MD [phone]

## 2023-08-11 NOTE — Progress Notes (Signed)
Pt resting comfortably. VSS. Airway intact. SBAR complete to RN. All questions answered.   

## 2023-08-12 ENCOUNTER — Telehealth: Payer: Self-pay | Admitting: *Deleted

## 2023-08-12 NOTE — Telephone Encounter (Signed)
  Follow up Call-     08/11/2023   12:41 PM  Call back number  Post procedure Call Back phone  # [phone]  Permission to leave phone message Yes     Patient questions:  Do you have a fever, pain , or abdominal swelling? No. Pain Score  0 *  Have you tolerated food without any problems? Yes.    Have you been able to return to your normal activities? Yes.    Do you have any questions about your discharge instructions: Diet   No. Medications  No. Follow up visit  No.  Do you have questions or concerns about your Care? No.  Actions: * If pain score is 4 or above: No action needed, pain <4.

## 2023-08-14 LAB — SURGICAL PATHOLOGY

## 2023-08-19 ENCOUNTER — Encounter: Payer: Self-pay | Admitting: Gastroenterology

## 2024-01-02 ENCOUNTER — Telehealth (INDEPENDENT_AMBULATORY_CARE_PROVIDER_SITE_OTHER): Payer: Self-pay | Admitting: Otolaryngology

## 2024-01-02 NOTE — Telephone Encounter (Signed)
 Confirmed appt date and location with patient for 01/05/2024.

## 2024-01-05 ENCOUNTER — Ambulatory Visit (INDEPENDENT_AMBULATORY_CARE_PROVIDER_SITE_OTHER): Payer: BLUE CROSS/BLUE SHIELD | Admitting: Otolaryngology

## 2024-01-05 VITALS — BP 99/64 | HR 83 | Ht 67.0 in | Wt 140.0 lb

## 2024-01-05 DIAGNOSIS — J343 Hypertrophy of nasal turbinates: Secondary | ICD-10-CM | POA: Insufficient documentation

## 2024-01-05 DIAGNOSIS — H903 Sensorineural hearing loss, bilateral: Secondary | ICD-10-CM | POA: Insufficient documentation

## 2024-01-05 DIAGNOSIS — R0981 Nasal congestion: Secondary | ICD-10-CM | POA: Diagnosis not present

## 2024-01-05 DIAGNOSIS — H6123 Impacted cerumen, bilateral: Secondary | ICD-10-CM | POA: Diagnosis not present

## 2024-01-05 DIAGNOSIS — J342 Deviated nasal septum: Secondary | ICD-10-CM | POA: Insufficient documentation

## 2024-01-05 DIAGNOSIS — J310 Chronic rhinitis: Secondary | ICD-10-CM | POA: Insufficient documentation

## 2024-01-05 NOTE — Progress Notes (Signed)
 Patient ID: Rhonda Gardner, female   DOB: [birth year], 68 y.o.   MRN: [id number]  Follow-up: Chronic nasal congestion, recurrent cerumen impaction, hearing loss  HPI: The patient is a 68 year old female who returns today for follow-up evaluation.  The patient was previously seen for chronic nasal congestion and hearing loss.  She was noted to have nasal mucosal congestion, nasal septal deviation, and bilateral inferior turbinate hypertrophy.  She was treated with Flonase nasal spray.  In addition, she was also noted to have bilateral symmetric high-frequency sensorineural hearing loss and cerumen impaction at her last visit.  The patient returns today reporting intermittent nasal congestion.  She denies any recent facial pain, fever, or sinus infection.  She also denies any change in her hearing.  She has stopped the use of Flonase.  Exam: General: Communicates without difficulty, well nourished, no acute distress. Head: Normocephalic, no evidence injury, no tenderness, facial buttresses intact without stepoff. Face/sinus: No tenderness to palpation and percussion. Facial movement is normal and symmetric. Eyes: PERRL, EOMI. No scleral icterus, conjunctivae clear. Neuro: CN II exam reveals vision grossly intact.  No nystagmus at any point of gaze. Ears: Auricles well formed without lesions.  Bilateral cerumen impaction.  Nose: External evaluation reveals normal support and skin without lesions.  Dorsum is intact.  Anterior rhinoscopy reveals congested mucosa over anterior aspect of inferior turbinates and deviated septum.  No purulence noted. Oral:  Oral cavity and oropharynx are intact, symmetric, without erythema or edema.  Mucosa is moist without lesions. Neck: Full range of motion without pain.  There is no significant lymphadenopathy.  No masses palpable.  Thyroid bed within normal limits to palpation.  Parotid glands and submandibular glands equal bilaterally without mass.  Trachea is midline.  Neuro:  CN 2-12 grossly intact.   Procedure: Bilateral cerumen disimpaction Anesthesia: None Description: Under the operating microscope, the cerumen is carefully removed with a combination of cerumen currette, alligator forceps, and suction catheters.  After the cerumen is removed, the TMs are noted to be normal.  No mass, erythema, or lesions. The patient tolerated the procedure well.   Assessment: 1.  Chronic rhinitis with nasal mucosal congestion, nasal septal deviation, and bilateral inferior turbinate hypertrophy. 2.  Bilateral recurrent cerumen impaction.  After the disimpaction procedure, both tympanic membranes and middle ear spaces are noted to be normal. 3.  Subjectively stable bilateral high-frequency sensorineural hearing loss.  Plan: 1.  Otomicroscopy with bilateral cerumen disimpaction. 2.  The physical exam findings are reviewed with the patient. 3.  The patient is encouraged to restart the Flonase nasal spray.  Nasal saline irrigation is also encouraged. 4.  The patient will return for reevaluation in 1 year.  We will repeat her hearing test at that time.

## 2024-01-21 ENCOUNTER — Other Ambulatory Visit: Payer: Self-pay | Admitting: Family Medicine

## 2024-01-21 DIAGNOSIS — F3170 Bipolar disorder, currently in remission, most recent episode unspecified: Secondary | ICD-10-CM

## 2024-02-18 LAB — HM MAMMOGRAPHY

## 2024-02-25 LAB — HM DEXA SCAN

## 2024-02-26 ENCOUNTER — Encounter: Payer: Self-pay | Admitting: Family Medicine

## 2024-03-05 ENCOUNTER — Ambulatory Visit: Payer: Self-pay | Admitting: Family Medicine

## 2024-04-07 ENCOUNTER — Other Ambulatory Visit: Payer: Self-pay | Admitting: Family Medicine

## 2024-04-07 DIAGNOSIS — F3170 Bipolar disorder, currently in remission, most recent episode unspecified: Secondary | ICD-10-CM

## 2024-04-12 ENCOUNTER — Telehealth: Payer: Self-pay

## 2024-04-12 NOTE — Telephone Encounter (Signed)
 Copied from CRM [id number]. Topic: Clinical - Prescription Issue >> Apr 12, 2024 11:09 AM Kevelyn M wrote: The pharmacy has the this prescription (lamoTRIgine  (LAMICTAL ) 200 MG tablet) but the pharmacy will not release this prescription because it would be an early release. Patient is leaving at 3am tomorrow and needs this med before going out town. They will not release this med until after July 4th unless there is a request by the doctor. Pharmacy # [id number] .  Sent to provider

## 2024-05-14 ENCOUNTER — Telehealth: Payer: Self-pay | Admitting: Gastroenterology

## 2024-05-14 NOTE — Telephone Encounter (Signed)
 Patient called and stated that she would like to schedule a follow up colonoscopy. Patient has a recall for 08/18/2024 at therefore was advised we did not have November calendar out as of yet and was advised to call back in September to check to see if the November calendar had been released. Patient understood and hung up. Patient then called back moments later to have the nurse please return her call in regards to scheduling for a follow up colonoscopy. Please advise.

## 2024-05-14 NOTE — Telephone Encounter (Signed)
 Patient inquiring about scheduling a colonoscopy for November. Advised patient that we do not have the November schedule out. Patient wanted to know when she could call back to be scheduled for her colonoscopy. Advised patient that she could try calling back on September 1, but there were no guarantees of whether the schedule would be out by then.

## 2024-05-25 NOTE — Patient Instructions (Addendum)
 HEALTH MAINTENANCE RECOMMENDATIONS:  It is recommended that you get at least 30 minutes of aerobic exercise at least 5 days/week (for weight loss, you may need as much as 60-90 minutes). This can be any activity that gets your heart rate up. This can be divided in 10-15 minute intervals if needed, but try and build up your endurance at least once a week.  Weight bearing exercise is also recommended twice weekly.  Eat a healthy diet with lots of vegetables, fruits and fiber.  Colorful foods have a lot of vitamins (ie green vegetables, tomatoes, red peppers, etc).  Limit sweet tea, regular sodas and alcoholic beverages, all of which has a lot of calories and sugar.  Up to 1 alcoholic drink daily may be beneficial for women (unless trying to lose weight, watch sugars).  Drink a lot of water.  Calcium recommendations are 1200-1500 mg daily (1500 mg for postmenopausal women or women without ovaries), and vitamin D  1000 IU daily.  This should be obtained from diet and/or supplements (vitamins), and calcium should not be taken all at once, but in divided doses.  Monthly self breast exams and yearly mammograms for women over the age of 53 is recommended.  Sunscreen of at least SPF 30 should be used on all sun-exposed parts of the skin when outside between the hours of 10 am and 4 pm (not just when at beach or pool, but even with exercise, golf, tennis, and yard work!)  Use a sunscreen that says broad spectrum so it covers both UVA and UVB rays, and make sure to reapply every 1-2 hours.  Remember to change the batteries in your smoke detectors when changing your clock times in the spring and fall. Carbon monoxide detectors are recommended for your home.  Use your seat belt every time you are in a car, and please drive safely and not be distracted with cell phones and texting while driving.   Rhonda Gardner , Thank you for taking time to come for your Medicare Wellness Visit. I appreciate your ongoing  commitment to your health goals. Please review the following plan we discussed and let me know if I can assist you in the future.   This is a list of the screening recommended for you and due dates:  Health Maintenance  Topic Date Due   Hepatitis B Vaccine (2 of 3 - 19+ 3-dose series) 12/09/2007   DTaP/Tdap/Td vaccine (12 - Td or Tdap) 01/21/2023   COVID-19 Vaccine (8 - 2024-25 season) 06/15/2023   Medicare Annual Wellness Visit  05/18/2024   Flu Shot  05/14/2024   Colon Cancer Screening  08/10/2024   Mammogram  02/17/2025   Pneumococcal Vaccine for age over 12  Completed   DEXA scan (bone density measurement)  Completed   Hepatitis C Screening  Completed   Zoster (Shingles) Vaccine  Completed   HPV Vaccine  Aged Out   Meningitis B Vaccine  Aged Out   We discussed your May bone density test. This showed decline from the prior one in 2022.  It still shows osteopenia, not osteoporosis, but is trending down. Options include resuming Reclast  infusions (once a year for 3 years) now, to treat the osteopenia and prevent progression, vs rechecking in 2 years, and starting if further decline occurs. We agreed it is likely best to be proactive and resume Reclast  infusions now, rather than waiting.  Someone should contact you regarding the infusion (done at a [facility] location).  Be sure to continue your  calcium, vitamin D , and get regular weight-bearing exercise. We briefly mentioned [employer]ne[employer] classes (at various rec centers and gyms), and also [facility] on [address].

## 2024-05-25 NOTE — Progress Notes (Signed)
 Chief Complaint  Patient presents with   Medicare well visit    RSV declined/ TDAP done 06/26/2023   Fatigue    Intermittent     Rhonda Gardner is a 68 y.o. female who presents for Medicare Annual Wellness visit and follow-up on chronic medical conditions.    She has episodes of fatigue, where she can barely put one foot in front of the other. She lies down, and often that helps it pass and feel better.  If she is out and about, she will sit in her car for a while until it passes.  She cannot detect a pattern, not related to eating, exercise, not relieved by eating or drinking. Feels similar to the type of fatigue she had with chemo--overall, whole body crash. Frequency varies from 2-3x/week, or some weeks without any.  Adenomatous colon polyps: last colonoscopy was 07/2023, had SSP and tubular adenoma.  1 year f/u was recommended. She has tried to call to schedule this, but they don't have those schedules set up yet. She denies any bowel complaints.  H/o duodenal, low-grade follicular lymphoma, diagnosed 10/2018.  She was treated with radiation.  Post-treatment endoscopy and biopsy showed no evidence of residual lymphoma. She last saw Dr. Larraine in follow up in 09/2022, and was released from care (to f/u prn).  She denies any problems.  H/o skull fracture 02/2019. She has had changes to smell and taste, along with aphasia, balance problems. She had seen neurosurgeon, diagnosed with post-concussive syndrome. She completed rehab, balance improved. Had recurrence of balance problems, fatigue. Saw Dr. Margaret. MRI in 07/2022 was unremarkable. She no longer has any balance concerns (it is not perfect, but it is much better), but is having some fatigue as reported above.. She still has some ongoing issues with taste, she tends to taste savory things better. Chocolate, coffee, vanilla and peanut butter taste terrible to her. She can't eat most desserts (just [place] butter  cookies).  H/o breast Cancer: She had been under the care of Dr. Layla, now released to PCP care.  Last mammogram was 02/2024. Denies any breast concerns.   Osteoporosis:  This had been managed by Dr. Layla with zoledronate infusions, every 6 months. Per chart, appears to have been started 08/2016, last received 04/2020.  DEXA in 06/2018 showed T-1.8 at spine.  01/2021 DEXA showed slight decline in spine, T-2.1 (from T-1.8 in 2019, had been T-2.5 in 06/2016).  In 04/2021 Dr. Layla recommended resuming zometa  yearly (then or could wait 2 years for recheck), stated she had received 7 doses between 2017-2021. She hasn't had further treatments (last in 2021).  Last DEXA 02/2024 had T-2.3 at spine, -1.6 L femur neck.  Was on a different machine, no comparison made. She takes Calcium tablet twice daily, drinks milk daily, and eats kale most days.  Takes 5000 IU of vitamin D3.  Bipolar disorder:  Diagnosed age 50 or 80.  She has been stable on her current regimen of lamictal .  Moods are well controlled, no side effects.  She previously was under the care of Dr. Elodie Anon and Hoy Lee (who retired).  We have been managing these medications, and she continues to do well. She had some issues with certain generic formulations in the past (had myalgias), but has been doing well. She reports her moods are good.  Subclinical hypothyroidism:  TSH was mildly elevated at 5.270 in the past (04/2020).  Recheck was normal, and TPO was negative. She denies changes in hair/skin/bowels/moods/weight. Reports  intermittent fatigue, as discussed above.  Lab Results  Component Value Date   TSH 4.210 05/06/2022     Immunization History  Administered Date(s) Administered   DTaP 10/14/1954, 10/15/1955, 10/15/1959   Fluad Quad(high Dose 65+) 07/08/2019, 07/10/2020, 08/19/2022   Gamma Globulin 08/23/1988   Hepatitis A 11/02/1996, 09/29/2002   Hepatitis A, Adult 11/02/1996, 09/29/2002   Hepatitis B  11/11/2007   Hepatitis B, ADULT 11/11/2007   IPV 08/15/1988   Influenza Split 11/11/2007, 07/13/2013   Influenza,inj,Quad PF,6+ Mos 05/30/2016, 07/17/2017, 07/04/2018   Influenza-Unspecified 07/31/2015, 05/30/2016, 07/17/2017   MMR 04/29/2003   Measles 07/25/1988   Meningococcal polysaccharide vaccine (MPSV4) 07/08/1988   PFIZER Comirnaty(Gray Top)Covid-19 Tri-Sucrose Vaccine 11/06/2019, 11/27/2019   PFIZER(Purple Top)SARS-COV-2 Vaccination 11/06/2019, 11/27/2019, 07/10/2020   PNEUMOCOCCAL CONJUGATE-20 05/19/2023   Pneumococcal Conjugate-13 04/08/2016   Pneumococcal Polysaccharide-23 04/27/2020   Rubella 07/25/1988   Td 10/15/1987, 11/02/1996, 01/23/1998   Td (Adult), 2 Lf Tetanus Toxid, Preservative Free 10/15/1987, 11/02/1996, 01/23/1998   Tdap 11/11/2007, 01/20/2013, 06/26/2023   Typhoid Live 11/11/2007   Typhoid Parenteral 07/25/1988, 08/23/1988   Unspecified SARS-COV-2 Vaccination 11/06/2019, 11/27/2019   Zoster Recombinant(Shingrix ) 12/04/2017, 02/19/2018   Zoster, Live 12/01/2014    Last Pap smear:04/2019--normal, no high risk HPV detected Last mammogram: 02/2024 Last colonoscopy:07/2023, had SSP and tubular adenoma.  1 year f/u was recommended  Last DEXA: 02/2024 T-2.3 at spine, -1.6 L femur neck (01/2021 T-2.1 spine, 06/2018 T-1.8 at spine, improved from 06/2016 T-2.5) Dentist: every 6 months Ophtho: every 2 years Exercise: Walks up and down the driveway, 30 minutes at least 3x/week. Weight-bearing exercise 2x/week (  Vitamin D -OH 34.2 in 04/2022  Lipids: Lab Results  Component Value Date   CHOL 212 (H) 04/27/2020   HDL 106 04/27/2020   LDLCALC 89 04/27/2020   TRIG 99 04/27/2020   CHOLHDL 2.0 04/27/2020    Patient Care Team: Randol Dawes, MD as PCP - General (Family Medicine) Glean Stephane BROCKS, RN (Inactive) as Registered Nurse Tyree Nanetta SAILOR, RN as Registered Nurse Curvin Deward MOULD, MD as Consulting Physician (General Surgery) Blinda Donnice Glean, MD as  Referring Physician (Hematology and Oncology) Larraine Lonni Motto, MD as Physician Assistant (Hematology and Oncology) Queenie, Asberry LABOR, MD as Referring Physician (Internal Medicine) Karis Clunes, MD as Consulting Physician (Otolaryngology) Onetha Kuba, MD as Consulting Physician (Neurosurgery) Magdalen Pasco RAMAN, DPM as Consulting Physician (Podiatry) Michelina Sharper, DMD (Dentistry) Patrcia Sharper, MD as Consulting Physician (Ophthalmology) Waylan Cain, MD as Consulting Physician (Ophthalmology) [place], Amy, MD as Consulting Physician (Dermatology)  Dr. Margaret (neurology, seen 06/2022) Dr. Shila (GI)  Depression Screening:     05/19/2023    9:46 AM 05/06/2022    9:52 AM 04/30/2021    9:48 AM 05/16/2020   10:43 AM 04/27/2020    8:44 AM  Depression screen PHQ 2/9  Decreased Interest 0 0 0 0 0  Down, Depressed, Hopeless 0 1 0 0 0  PHQ - 2 Score 0 1 0 0 0  Altered sleeping  0     Tired, decreased energy  1     Change in appetite  2     Feeling bad or failure about yourself   0     Trouble concentrating  1     Moving slowly or fidgety/restless  0     Suicidal thoughts  0     PHQ-9 Score  5     Difficult doing work/chores  Somewhat difficult       Falls screen:     05/19/2023  9:46 AM 02/20/2023    2:26 PM 08/19/2022    9:29 AM 05/06/2022    9:50 AM 04/30/2021    9:48 AM  Fall Risk   Falls in the past year? 0 0 0 0 0  Number falls in past yr: 0 0 0 0 0  Injury with Fall? 0 0 0 0 0  Risk for fall due to : No Fall Risks No Fall Risks No Fall Risks No Fall Risks No Fall Risks  Follow up Falls evaluation completed Falls evaluation completed Falls evaluation completed  Falls evaluation completed  Falls evaluation completed      Data saved with a previous flowsheet row definition    Functional Status Survey: Is the patient deaf or have difficulty hearing?: Yes (slight hearing loss) Does the patient have difficulty seeing, even when wearing glasses/contacts?: No Does the  patient have difficulty concentrating, remembering, or making decisions?: No Does the patient have difficulty walking or climbing stairs?: No Does the patient have difficulty dressing or bathing?: No Does the patient have difficulty doing errands alone such as visiting a doctor's office or shopping?: No     6 CIT screen was entirely normal (score of 0)   End of Life Discussion:  Patient has a living will and medical power of attorney, in chart.   PMH, PSH, SH and FH were reviewed and updated  Outpatient Encounter Medications as of 05/27/2024  Medication Sig Note   Alpha-Lipoic Acid 600 MG TABS Take by mouth.    Calcium-Magnesium  (CALCIUM MAGNESIUM  750) 300-300 MG TABS Take by mouth. Bid    carboxymethylcellulose (REFRESH PLUS) 0.5 % SOLN Place 1 drop into both eyes 3 (three) times daily as needed (dry eyes.). 05/19/2023: As needed   Cholecalciferol  (VITAMIN D -3) 125 MCG (5000 UT) TABS Take 5,000 Units by mouth daily. 05/19/2023: Currently taking 5000 IU daily   fluticasone  (FLONASE ) 50 MCG/ACT nasal spray Place 1 spray into both nostrils daily. 06/30/2023: As needed   ibuprofen (ADVIL) 200 MG tablet Take 200 mg by mouth every 6 (six) hours as needed. 06/30/2023: As needed   lamoTRIgine  (LAMICTAL ) 200 MG tablet Take 1/2 (one-half) tablet by mouth once daily    Lutein -Zeaxanthin 25-5 MG CAPS Take 1 capsule by mouth daily.    naproxen sodium (ALEVE) 220 MG tablet Take by mouth.    No facility-administered encounter medications on file as of 05/27/2024.   Allergies  Allergen Reactions   Clindamycin/Lincomycin Diarrhea    Resulted in C-diff   Other Nausea Only    Stronger pain meds   Adhesive [Tape] Rash    Pt is sensitive to any kind of adhesive tape products.    ROS:  The patient denies anorexia, fever, headaches,  decreased hearing (mild decline), ear pain, sore throat, breast concerns, chest pain, palpitations, syncope, dyspnea on exertion, cough, swelling, nausea, vomiting, diarrhea,  constipation, abdominal pain, melena, hematochezia, indigestion/heartburn, hematuria, incontinence, dysuria, vaginal bleeding, discharge, odor or itch, genital lesions, joint pains, numbness, tingling, weakness, tremor, suspicious skin lesions, depression, anxiety, abnormal bleeding/bruising, or enlarged lymph nodes.  Vaginal dryness with intercourse, controlled with lubricants Nocturia 2-4x, unchanged. Blurred and double vision when reading a lot (has known astygmatism). Denies vertigo or lightheadedness. Only very slight balance problems. Does HEP from PT. No changes to hair/skin/nails/weight/bowels. Intermittent fatigue per HPI. Abnormal taste persists, and abnormal smell (improved, but not normal). Denies hemorrhoid issues or rectal bleeding.   PHYSICAL EXAM:  BP 98/76 (BP Location: Right Arm, Patient Position: Sitting, Cuff Size: Normal)  Pulse 71   Ht 5' 6 (1.676 m)   Wt 145 lb (65.8 kg)   SpO2 98%   BMI 23.40 kg/m   Wt Readings from Last 3 Encounters:  05/27/24 145 lb (65.8 kg)  01/05/24 140 lb (63.5 kg)  08/11/23 142 lb (64.4 kg)    General Appearance:     Alert, cooperative, no distress, appears stated age, in good spirits.  Head:     Normocephalic, without obvious abnormality, atraumatic   Eyes:     PERRL, conjunctiva/corneas clear, EOM's intact, fundi  benign   Ears:     Normal TM's and external ear canals.   Nose:    no drainage or sinus tenderness  Throat:    Normal mucosa, no lesions  Neck:    Supple, no lymphadenopathy;  thyroid :  no enlargement/ tenderness/nodules; no carotid bruit or JVD   Back:     Spine nontender, no curvature, ROM normal, no CVA tenderness   Lungs:      Clear to auscultation bilaterally without wheezes, rales or ronchi; respirations unlabored   Chest Wall:     No tenderness or deformity. WHSS R upper chest.   Heart:     Regular rate and rhythm, S1 and S2 normal, no murmur, rub   or gallop   Breast Exam:     No tenderness, nipple discharge  or inversion. No axillary lymphadenopathy. WHSS at left lateral breast. No masses.  Abdomen:      Soft, non-tender, nondistended, normoactive bowel sounds, no masses, no hepatosplenomegaly   Genitalia:     Not performed  Rectal:     Not performed  Extremities:    No clubbing, cyanosis or edema   Pulses:    2+ and symmetric all extremities   Skin:    Skin color, texture, turgor normal, no rashes or lesions.  Lymph nodes:    Cervical, supraclavicular, inguinal and axillary nodes normal   Neurologic:    Normal strength, sensation and gait; reflexes 2+ and symmetric throughout. Cranial nerves grossly intact.  Normal gait                Psych:   Normal mood, affect, hygiene and grooming   ASSESSMENT/PLAN:  Medicare annual wellness visit, subsequent  Fatigue, unspecified type - intermittent, severe, unclear etiology. To keep journal. Ddx reviewed. Check labs. ?if related to low BP. Stay hydrated - Plan: CBC with Differential/Platelet, Comprehensive metabolic panel with GFR, VITAMIN D  25 Hydroxy (Vit-D Deficiency, Fractures), TSH  Other osteoporosis without current pathological fracture - prev treated with reclast , gradual decline during drug holiday.  Restart, rec yearly infusion x 3. Disc Ca, D, weight-bearing exercises - Plan: VITAMIN D  25 Hydroxy (Vit-D Deficiency, Fractures)  History of breast cancer - Plan: CBC with Differential/Platelet, Comprehensive metabolic panel with GFR  History of lymphoma - Plan: CBC with Differential/Platelet, Comprehensive metabolic panel with GFR  Bipolar disorder in full remission, most recent episode unspecified type (HCC) - stable on current regimen, continue  Medication monitoring encounter - Plan: Comprehensive metabolic panel with GFR, VITAMIN D  25 Hydroxy (Vit-D Deficiency, Fractures)   Discussed monthly self breast exams and yearly mammograms; at least 30 minutes of aerobic activity at least 5 days/week, weight-bearing exercise at least 2x/week; proper  sunscreen use reviewed; healthy diet, including goals of calcium and vitamin D  intake and alcohol recommendations (less than or equal to 1 drink/day) reviewed; regular seatbelt use; changing batteries in smoke detectors.  Immunization recommendations discussed--continue yearly flu shots. Updated bivalent COVID booster  is recommended when available in the Fall.   RSV vaccine was discussed, she prefers to wait until age 3. Colonoscopy recommendations reviewed-- due end of October for 1 year f/u with Dr. Shila. DEXA in 2 years Lipids next year  MOST form reviewed and completed, Full Code, Full Care.    Medicare Attestation I have personally reviewed: [name] medical and social history Their use of alcohol, tobacco or illicit drugs Their current medications and supplements [name] functional ability including ADLs,fall risks, home safety risks, cognitive, and hearing and visual impairment Diet and physical activities Evidence for depression or mood disorders  [name] weight, height, BMI have been recorded in the chart.  I have made referrals, counseling, and provided education to the patient based on review of the above and I have provided the patient with a written personalized care plan for preventive services.

## 2024-05-27 ENCOUNTER — Ambulatory Visit (INDEPENDENT_AMBULATORY_CARE_PROVIDER_SITE_OTHER): Payer: Medicare Other | Admitting: Family Medicine

## 2024-05-27 ENCOUNTER — Encounter: Payer: Self-pay | Admitting: Family Medicine

## 2024-05-27 VITALS — BP 98/76 | HR 71 | Ht 66.0 in | Wt 145.0 lb

## 2024-05-27 DIAGNOSIS — Z853 Personal history of malignant neoplasm of breast: Secondary | ICD-10-CM | POA: Diagnosis not present

## 2024-05-27 DIAGNOSIS — Z8572 Personal history of non-Hodgkin lymphomas: Secondary | ICD-10-CM | POA: Diagnosis not present

## 2024-05-27 DIAGNOSIS — Z5181 Encounter for therapeutic drug level monitoring: Secondary | ICD-10-CM

## 2024-05-27 DIAGNOSIS — F3170 Bipolar disorder, currently in remission, most recent episode unspecified: Secondary | ICD-10-CM

## 2024-05-27 DIAGNOSIS — Z0000 Encounter for general adult medical examination without abnormal findings: Secondary | ICD-10-CM

## 2024-05-27 DIAGNOSIS — R5383 Other fatigue: Secondary | ICD-10-CM

## 2024-05-27 DIAGNOSIS — M818 Other osteoporosis without current pathological fracture: Secondary | ICD-10-CM

## 2024-05-28 ENCOUNTER — Encounter: Payer: Self-pay | Admitting: Family Medicine

## 2024-05-28 ENCOUNTER — Telehealth: Payer: Self-pay

## 2024-05-28 LAB — COMPREHENSIVE METABOLIC PANEL WITH GFR
ALT: 18 IU/L (ref 0–32)
AST: 22 IU/L (ref 0–40)
Albumin: 4.7 g/dL (ref 3.9–4.9)
Alkaline Phosphatase: 57 IU/L (ref 44–121)
BUN/Creatinine Ratio: 17 (ref 12–28)
BUN: 16 mg/dL (ref 8–27)
Bilirubin Total: 0.4 mg/dL (ref 0.0–1.2)
CO2: 21 mmol/L (ref 20–29)
Calcium: 9.2 mg/dL (ref 8.7–10.3)
Chloride: 103 mmol/L (ref 96–106)
Creatinine, Ser: 0.95 mg/dL (ref 0.57–1.00)
Globulin, Total: 1.7 g/dL (ref 1.5–4.5)
Glucose: 91 mg/dL (ref 70–99)
Potassium: 3.9 mmol/L (ref 3.5–5.2)
Sodium: 140 mmol/L (ref 134–144)
Total Protein: 6.4 g/dL (ref 6.0–8.5)
eGFR: 64 mL/min/1.73 (ref >59)

## 2024-05-28 LAB — CBC WITH DIFFERENTIAL/PLATELET
Basophils Absolute: 0.1 x10E3/uL (ref 0.0–0.2)
Basos: 1 %
EOS (ABSOLUTE): 0.1 x10E3/uL (ref 0.0–0.4)
Eos: 2 %
Hematocrit: 41.7 % (ref 34.0–46.6)
Hemoglobin: 13.4 g/dL (ref 11.1–15.9)
Immature Grans (Abs): 0 x10E3/uL (ref 0.0–0.1)
Immature Granulocytes: 0 %
Lymphocytes Absolute: 0.8 x10E3/uL (ref 0.7–3.1)
Lymphs: 14 %
MCH: 31.5 pg (ref 26.6–33.0)
MCHC: 32.1 g/dL (ref 31.5–35.7)
MCV: 98 fL — ABNORMAL HIGH (ref 79–97)
Monocytes Absolute: 0.4 x10E3/uL (ref 0.1–0.9)
Monocytes: 8 %
Neutrophils Absolute: 3.9 x10E3/uL (ref 1.4–7.0)
Neutrophils: 75 %
Platelets: 209 x10E3/uL (ref 150–450)
RBC: 4.26 x10E6/uL (ref 3.77–5.28)
RDW: 12.6 % (ref 11.7–15.4)
WBC: 5.3 x10E3/uL (ref 3.4–10.8)

## 2024-05-28 LAB — TSH: TSH: 3.93 u[IU]/mL (ref 0.450–4.500)

## 2024-05-28 LAB — VITAMIN D 25 HYDROXY (VIT D DEFICIENCY, FRACTURES): Vit D, 25-Hydroxy: 58.4 ng/mL (ref 30.0–100.0)

## 2024-05-28 NOTE — Addendum Note (Signed)
 Addended by: VICCI HUSBAND A on: 05/28/2024 09:10 AM   Modules accepted: Orders

## 2024-05-28 NOTE — Progress Notes (Signed)
 Placed reclast  for 3 years to Infusion center

## 2024-05-28 NOTE — Telephone Encounter (Signed)
 Dr. Randol, patient will be scheduled as soon as possible.  Auth Submission: NO AUTH NEEDED Site of care: Site of care: [facility] Payer: Medicare A/B with BCBS supplement Medication & CPT/J Code(s) submitted: Reclast  (Zolendronic acid) J3489 Diagnosis Code:  Route of submission (phone, fax, portal):  Phone # Fax # Auth type: Buy/Bill PB Units/visits requested: 5mg  x 1 dose Reference number:  Approval from: 05/28/24 to 09/27/24

## 2024-05-29 ENCOUNTER — Ambulatory Visit: Payer: Self-pay | Admitting: Family Medicine

## 2024-06-08 ENCOUNTER — Ambulatory Visit

## 2024-06-08 VITALS — BP 117/75 | HR 65 | Temp 98.3°F | Resp 14 | Ht 66.0 in | Wt 148.6 lb

## 2024-06-08 DIAGNOSIS — M818 Other osteoporosis without current pathological fracture: Secondary | ICD-10-CM | POA: Diagnosis not present

## 2024-06-08 MED ORDER — ZOLEDRONIC ACID 5 MG/100ML IV SOLN
5.0000 mg | Freq: Once | INTRAVENOUS | Status: AC
  Administered 2024-06-08: 5 mg via INTRAVENOUS
  Filled 2024-06-08: qty 100

## 2024-06-08 MED ORDER — DIPHENHYDRAMINE HCL 25 MG PO CAPS
25.0000 mg | ORAL_CAPSULE | Freq: Once | ORAL | Status: DC
Start: 2024-06-08 — End: 2024-06-08

## 2024-06-08 MED ORDER — ACETAMINOPHEN 325 MG PO TABS
650.0000 mg | ORAL_TABLET | Freq: Once | ORAL | Status: DC
Start: 2024-06-08 — End: 2024-06-08

## 2024-06-08 MED ORDER — SODIUM CHLORIDE 0.9 % IV SOLN: INTRAVENOUS | Status: DC

## 2024-06-08 NOTE — Progress Notes (Signed)
 Diagnosis: Osteoporosis  Provider:  Mannam, Praveen MD  Procedure: IV Infusion  IV Type: Peripheral, IV Location: R Antecubital  Banana Bag, Reclast  (Zolendronic Acid), Dose: 5 mg  Infusion Start Time: 1409  Infusion Stop Time: 1440  Post Infusion IV Care: Observation period completed and Peripheral IV Discontinued  Discharge: Condition: Good, Destination: Home . AVS Declined  Performed by:  Caoimhe Damron E, RN

## 2024-06-15 ENCOUNTER — Encounter: Payer: Self-pay | Admitting: Gastroenterology

## 2024-07-16 ENCOUNTER — Other Ambulatory Visit: Payer: Self-pay | Admitting: Family Medicine

## 2024-07-16 DIAGNOSIS — F3170 Bipolar disorder, currently in remission, most recent episode unspecified: Secondary | ICD-10-CM

## 2024-07-21 ENCOUNTER — Ambulatory Visit: Admitting: Gastroenterology

## 2024-07-22 ENCOUNTER — Encounter: Payer: Self-pay | Admitting: Family Medicine

## 2024-07-22 ENCOUNTER — Encounter: Payer: Self-pay | Admitting: Oncology

## 2024-07-26 ENCOUNTER — Ambulatory Visit (AMBULATORY_SURGERY_CENTER): Admitting: *Deleted

## 2024-07-26 VITALS — Ht 66.0 in | Wt 140.0 lb

## 2024-07-26 DIAGNOSIS — Z860100 Personal history of colon polyps, unspecified: Secondary | ICD-10-CM

## 2024-07-26 DIAGNOSIS — Z800 Family history of malignant neoplasm of digestive organs: Secondary | ICD-10-CM

## 2024-07-26 MED ORDER — NA SULFATE-K SULFATE-MG SULF 17.5-3.13-1.6 GM/177ML PO SOLN: 1.0000 | Freq: Once | ORAL | 0 refills | Status: AC

## 2024-07-26 MED ORDER — ONDANSETRON HCL 4 MG PO TABS: 4.0000 mg | ORAL_TABLET | Freq: Three times a day (TID) | ORAL | 0 refills | Status: AC | PRN

## 2024-07-26 NOTE — Progress Notes (Signed)
 Pt's name and DOB verified at the beginning of the pre-visit with 2 identifiers  Permission given to speak with  Pt denies any difficulty with ambulating,sitting, laying down or rolling side to side  Pt has no issues moving head neck or swallowing  No egg or soy allergy known to patient   HX of PONV  No FH of Malignant Hyperthermia  Pt is not on home 02   Pt is not on blood thinners   Pt denies issues with constipation    Pt is not on dialysis  Pt denise any abnormal heart rhythms   Pt denies any upcoming cardiac testing  Patient's chart reviewed by Norleen Schillings CNRA prior to pre-visit and patient appropriate for the [facility].  Pre-visit completed and red dot placed by patient's name on their procedure day (on provider's schedule).     Visit in person  Pt states weight is 140 lb    Pt given  both [facility] main # and MD on call # prior to instructions.  Informed pt to come in at the time discussed and is shown on PV instructions.  Pt instructed to use Singlecare.com or GoodRx for a price reduction on prep  Instructed pt where to find PV instructions in My Ch. Copy of instructions  to be sent in mail and address read back to pt to verify correct on envelope. Instructed pt on all aspects of written instructions including med holds clothing to wear and foods to eat and not eat as well as after procedure legal restrictions and to call MD on call if needed.. Pt states understanding. Instructed pt to review instructions again prior to procedure and call main # given if has any questions or any issues. Pt states they will.

## 2024-08-09 ENCOUNTER — Ambulatory Visit (AMBULATORY_SURGERY_CENTER): Admitting: Gastroenterology

## 2024-08-09 ENCOUNTER — Encounter: Payer: Self-pay | Admitting: Gastroenterology

## 2024-08-09 VITALS — BP 92/45 | HR 89 | Temp 97.4°F | Resp 19 | Ht 66.0 in | Wt 143.0 lb

## 2024-08-09 DIAGNOSIS — D122 Benign neoplasm of ascending colon: Secondary | ICD-10-CM | POA: Diagnosis not present

## 2024-08-09 DIAGNOSIS — K635 Polyp of colon: Secondary | ICD-10-CM

## 2024-08-09 DIAGNOSIS — Z860100 Personal history of colon polyps, unspecified: Secondary | ICD-10-CM

## 2024-08-09 DIAGNOSIS — Z800 Family history of malignant neoplasm of digestive organs: Secondary | ICD-10-CM

## 2024-08-09 DIAGNOSIS — K5730 Diverticulosis of large intestine without perforation or abscess without bleeding: Secondary | ICD-10-CM | POA: Diagnosis not present

## 2024-08-09 DIAGNOSIS — D125 Benign neoplasm of sigmoid colon: Secondary | ICD-10-CM

## 2024-08-09 DIAGNOSIS — Z1211 Encounter for screening for malignant neoplasm of colon: Secondary | ICD-10-CM | POA: Diagnosis not present

## 2024-08-09 DIAGNOSIS — K648 Other hemorrhoids: Secondary | ICD-10-CM

## 2024-08-09 DIAGNOSIS — K644 Residual hemorrhoidal skin tags: Secondary | ICD-10-CM

## 2024-08-09 DIAGNOSIS — Z860101 Personal history of adenomatous and serrated colon polyps: Secondary | ICD-10-CM

## 2024-08-09 MED ORDER — SODIUM CHLORIDE 0.9 % IV SOLN: 500.0000 mL | Freq: Once | INTRAVENOUS | Status: DC

## 2024-08-09 NOTE — Progress Notes (Unsigned)
 VS by RP   Pt's states no medical or surgical changes since previsit or office visit.

## 2024-08-09 NOTE — Op Note (Signed)
 [facility] Patient Name: Rhonda Gardner Procedure Date: 08/09/2024 12:50 PM MRN: [id number] Endoscopist: Gustav ALONSO Mcgee , MD, [id number] Age: 68 Referring MD:  Date of Birth: 24-Feb-1956 Gender: Female Account #: [id number] Procedure:                Colonoscopy Indications:              Screening in patient at increased risk: Family                            history of 1st-degree relative with colorectal                            cancer, High risk colon cancer surveillance:                            Personal history of adenoma (10 mm or greater in                            size), High risk colon cancer surveillance:                            Personal history of multiple (3 or more) adenomas Medicines:                Monitored Anesthesia Care Procedure:                Pre-Anesthesia Assessment:                           - Prior to the procedure, a History and Physical                            was performed, and patient medications and                            allergies were reviewed. [name] tolerance of                            previous anesthesia was also reviewed. The risks                            and benefits of the procedure and the sedation                            options and risks were discussed with the patient.                            All questions were answered, and informed consent                            was obtained. Prior Anticoagulants: The patient has                            taken no anticoagulant or antiplatelet agents. ASA  Grade Assessment: III - A patient with severe                            systemic disease. After reviewing the risks and                            benefits, the patient was deemed in satisfactory                            condition to undergo the procedure.                           After obtaining informed consent, the colonoscope                            was  passed under direct vision. Throughout the                            procedure, the patient's blood pressure, pulse, and                            oxygen saturations were monitored continuously. The                            Olympus Scope [id number] was introduced through the                            anus and advanced to the the cecum, identified by                            appendiceal orifice and ileocecal valve. The                            colonoscopy was performed without difficulty. The                            patient tolerated the procedure well. The quality                            of the bowel preparation was good. The ileocecal                            valve, appendiceal orifice, and rectum were                            photographed. Scope In: 1:06:55 PM Scope Out: 1:23:23 PM Scope Withdrawal Time: 0 hours 9 minutes 45 seconds  Total Procedure Duration: 0 hours 16 minutes 28 seconds  Findings:                 The perianal and digital rectal examinations were                            normal.  Three sessile polyps were found in the sigmoid                            colon and ascending colon. The polyps were 3 to 5                            mm in size. These polyps were removed with a cold                            snare. Resection and retrieval were complete.                           A few small-mouthed diverticula were found in the                            sigmoid colon.                           Non-bleeding external and internal hemorrhoids were                            found during retroflexion. The hemorrhoids were                            small. Complications:            No immediate complications. Estimated Blood Loss:     Estimated blood loss was minimal. Impression:               - Three 3 to 5 mm polyps in the sigmoid colon and                            in the ascending colon, removed with a cold snare.                             Resected and retrieved.                           - Diverticulosis in the sigmoid colon.                           - Non-bleeding external and internal hemorrhoids. Recommendation:           - Patient has a contact number available for                            emergencies. The signs and symptoms of potential                            delayed complications were discussed with the                            patient. Return to normal activities tomorrow.  Written discharge instructions were provided to the                            patient.                           - Resume previous diet.                           - Continue present medications.                           - Await pathology results.                           - Repeat colonoscopy in 3 - 5 years for                            surveillance based on pathology results. Zachari Alberta V. Latajah Thuman, MD 08/09/2024 1:29:23 PM This report has been signed electronically.

## 2024-08-09 NOTE — Progress Notes (Unsigned)
 Called to room to assist during endoscopic procedure.  Patient ID and intended procedure confirmed with present staff. Received instructions for my participation in the procedure from the performing physician.

## 2024-08-09 NOTE — Progress Notes (Unsigned)
 Sedate, gd SR, tolerated procedure well, VSS, report to RN

## 2024-08-09 NOTE — Progress Notes (Unsigned)
 [facility] History and Physical   Primary Care Physician:  Randol Dawes, MD   Reason for Procedure:  History of adenomatous colon polyps, family h/o colon cancer  Plan:    Surveillance colonoscopy with possible interventions as needed     HPI: Rhonda Gardner is a very pleasant 68 y.o. female here for surveillance colonoscopy. Denies any nausea, vomiting, abdominal pain, melena or bright red blood per rectum  The risks and benefits as well as alternatives of endoscopic procedure(s) have been discussed and reviewed. All questions answered. The patient agrees to proceed.    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 difficile colitis    C. 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)   Non Hodgkin's lymphoma (HCC)    Osteoporosis 06/20/2016   T-2.5@spine  on Dexa ([facility])   Plantar fasciitis    PONV (postoperative nausea and vomiting)    Symptomatic anemia 07/16/2017    Past Surgical History:  Procedure Laterality Date   BIOPSY  07/03/2018   Procedure: BIOPSY;  Surgeon: Avram Lupita BRAVO, MD;  Location: [facility];  Service: Endoscopy;;   BIOPSY  09/02/2018   Procedure: BIOPSY;  Surgeon: Avram Lupita BRAVO, MD;  Location: [facility];  Service: Endoscopy;;   BIOPSY  10/22/2018   Procedure: BIOPSY;  Surgeon: Teressa Toribio SQUIBB, MD;  Location: [facility];  Service: Endoscopy;;   BREAST BIOPSY Bilateral    benign and a  right stereotatic breast biopsy.   COLONOSCOPY     ESOPHAGOGASTRODUODENOSCOPY N/A 07/16/2017   Procedure: ESOPHAGOGASTRODUODENOSCOPY (EGD);  Surgeon: Aneita Gwendlyn DASEN, MD;  Location: [facility];  Service: Endoscopy;  Laterality: N/A;   ESOPHAGOGASTRODUODENOSCOPY N/A 10/22/2018   Procedure: ESOPHAGOGASTRODUODENOSCOPY (EGD);  Surgeon: Teressa Toribio SQUIBB, MD;  Location: [facility];  Service: Endoscopy;  Laterality: N/A;   ESOPHAGOGASTRODUODENOSCOPY (EGD) WITH PROPOFOL  N/A 07/03/2018   Procedure: ESOPHAGOGASTRODUODENOSCOPY (EGD) WITH PROPOFOL ;  Surgeon: Avram Lupita BRAVO, MD;  Location: [facility];  Service: Endoscopy;  Laterality: N/A;   ESOPHAGOGASTRODUODENOSCOPY (EGD) WITH PROPOFOL  N/A 09/02/2018   Procedure: ESOPHAGOGASTRODUODENOSCOPY (EGD) WITH PROPOFOL ;  Surgeon: Avram Lupita BRAVO, MD;  Location: [facility];  Service: Endoscopy;  Laterality: N/A;   EUS N/A 10/22/2018   Procedure: UPPER ENDOSCOPIC ULTRASOUND (EUS) RADIAL;  Surgeon: Teressa Toribio SQUIBB, MD;  Location: [facility];  Service: Endoscopy;  Laterality: N/A;   fibroid excision  age 17   prolapsed fibroid removed   FINE NEEDLE ASPIRATION  10/22/2018   Procedure: FINE NEEDLE ASPIRATION;  Surgeon: Teressa Toribio SQUIBB, MD;  Location: [facility];  Service: Endoscopy;;   FOOT SURGERY Left 2014   Dr. Magdalen   FOOT SURGERY Right 08/2016   Dr. Magdalen --Shortening Osteotomy with Fixation 1st Metatarsal    HAMMER TOE SURGERY Right 12/04/2021   4th toe; Dr. Magdalen   MOUTH SURGERY  06/2017   CYST    PORT-A-CATH REMOVAL N/A 02/15/2016   Procedure: REMOVAL PORT-A-CATH;  Surgeon: Deward Null III, MD;  Location: [facility];  Service: General;  Laterality: N/A;   RADIOACTIVE SEED GUIDED PARTIAL MASTECTOMY WITH AXILLARY SENTINEL LYMPH NODE BIOPSY Left 05/25/2015   Procedure: LEFT BREAST LUMPECTOMY WITH RADIOACTIVE SEED AND SENTINEL LYMPH NODE BIOPSY;  Surgeon: Deward Null III, MD;  Location: [facility];  Service: General;   Laterality: Left;   UPPER GASTROINTESTINAL ENDOSCOPY      Prior to Admission medications   Medication Sig Start Date End Date Taking? Authorizing Provider  Alpha-Lipoic Acid 600 MG TABS Take by mouth.    [provider]  Calcium-Magnesium  (CALCIUM MAGNESIUM  750) 300-300 MG TABS Take by mouth. Bid    [provider]  carboxymethylcellulose (REFRESH PLUS) 0.5 % SOLN Place 1 drop into both eyes 3 (three) times daily as needed (dry eyes.).    [provider]  Cholecalciferol  (VITAMIN D -3) 125 MCG (5000 UT) TABS Take 5,000 Units by mouth daily.    [provider]  fluticasone  (FLONASE ) 50 MCG/ACT nasal spray Place 1 spray into both nostrils daily. Patient taking differently: Place 1 spray into both nostrils as needed.    [provider]  ibuprofen (ADVIL) 200 MG tablet Take 200 mg by mouth every 6 (six) hours as needed.    [provider]  lamoTRIgine  (LAMICTAL ) 200 MG tablet Take 1/2 (one-half) tablet by mouth once daily 07/16/24   Randol Dawes, MD  Lutein -Zeaxanthin 25-5 MG CAPS Take 1 capsule by mouth daily.    [provider]  naproxen sodium (ALEVE) 220 MG tablet Take by mouth. Patient taking differently: Take by mouth as needed. 06/20/16   [provider]  ondansetron  (ZOFRAN ) 4 MG tablet Take 1 tablet (4 mg total) by mouth every 8 (eight) hours as needed for nausea or vomiting. 07/26/24   [name] V, MD    Current Outpatient Medications  Medication Sig Dispense Refill   Alpha-Lipoic Acid 600 MG TABS Take by mouth.     Calcium-Magnesium  (CALCIUM MAGNESIUM  750) 300-300 MG TABS Take by mouth. Bid     carboxymethylcellulose (REFRESH PLUS) 0.5 % SOLN Place 1 drop into both eyes 3 (three) times daily as needed (dry eyes.).     Cholecalciferol  (VITAMIN D -3) 125 MCG (5000 UT) TABS Take 5,000 Units by mouth daily.     fluticasone  (FLONASE ) 50 MCG/ACT nasal spray Place 1 spray into both nostrils daily. (Patient taking  differently: Place 1 spray into both nostrils as needed.)     ibuprofen (ADVIL) 200 MG tablet Take 200 mg by mouth every 6 (six) hours as needed.     lamoTRIgine  (LAMICTAL ) 200 MG tablet Take 1/2 (one-half) tablet by mouth once daily 45 tablet 3   Lutein -Zeaxanthin 25-5 MG CAPS Take 1 capsule by mouth daily.     naproxen sodium (ALEVE) 220 MG tablet Take by mouth. (Patient taking differently: Take by mouth as needed.)     ondansetron  (ZOFRAN ) 4 MG tablet Take 1 tablet (4 mg total) by mouth every 8 (eight) hours as needed for nausea or vomiting. 2 tablet 0   Current Facility-Administered Medications  Medication Dose Route Frequency Provider Last Rate Last Admin   0.9 %  sodium chloride  infusion  500 mL Intravenous Once Shamari Trostel V, MD        Allergies as of 08/09/2024 - Review Complete 08/09/2024  Allergen Reaction Noted   Clindamycin/lincomycin Diarrhea 09/12/2017   Other Nausea Only 06/30/2018   Adhesive [tape] Rash 01/25/2015   Wound dressing adhesive Dermatitis 01/25/2015    Family  History  Problem Relation Age of Onset   Arthritis Mother        osteoarthritis   Macular degeneration Mother        wet and dry   Colon cancer Mother 82       colon cancer at age 28   Hypertension Mother        resolved with weight loss   Heart disease Father    Skin cancer Father        non melanoma - farmer   Osteoporosis Sister        osteopenia (states it resolved)   Mental retardation Sister    Blindness Sister        secondary to retrolental fibroplasia   Osteoporosis Sister    Breast cancer Sister 33       Paget's disease   Epilepsy Sister    Hypercholesterolemia Sister    Lung cancer Maternal Aunt        lung cancer   Cancer Paternal Aunt 25       ? stomach cancer   Goiter Paternal Aunt    Diabetes Paternal Aunt    Esophageal cancer Neg Hx    Rectal cancer Neg Hx    Stomach cancer Neg Hx    Colon polyps Neg Hx     Social History   Socioeconomic History   Marital  status: Married    Spouse name: Not on file   Number of children: 0   Years of education: Not on file   Highest education level: Not on file  Occupational History   Occupation: retired  Tobacco Use   Smoking status: Never   Smokeless tobacco: Never  Vaping Use   Vaping status: Never Used  Substance and Sexual Activity   Alcohol use: Yes    Alcohol/week: 2.0 standard drinks of alcohol    Types: 2 Standard drinks or equivalent per week    Comment: 2 glasses of wine/week   Drug use: No   Sexual activity: Yes    Partners: Male    Comment: postmenopausal  Other Topics Concern   Not on file  Social History Narrative   Married Immunologist) (1 dog (chocolate lab) passed away in 08-24-19)    Has a new dog (husband found a stray)   Homemaker      Updated 05/2024   Social Drivers of Health   Financial Resource Strain: Low Risk  (05/27/2024)   Overall Financial Resource Strain (CARDIA)    Difficulty of Paying Living Expenses: Not hard at all  Food Insecurity: No Food Insecurity (05/27/2024)   Hunger Vital Sign    Worried About Running Out of Food in the Last Year: Never true    Ran Out of Food in the Last Year: Never true  Transportation Needs: No Transportation Needs (05/27/2024)   PRAPARE - Administrator, Civil Service (Medical): No    Lack of Transportation (Non-Medical): No  Physical Activity: Patient Declined (05/19/2023)   Exercise Vital Sign    Days of Exercise per Week: Patient declined    Minutes of Exercise per Session: Patient declined  Stress: No Stress Concern Present (05/27/2024)   [employer] of Occupational Health - Occupational Stress Questionnaire    Feeling of Stress: Not at all  Social Connections: Unknown (05/27/2024)   Social Connection and Isolation Panel    Frequency of Communication with Friends and Family: Patient declined    Frequency of Social Gatherings with Friends and Family: Patient declined  Attends Religious Services: Patient declined     Active Member of Clubs or Organizations: Patient declined    Attends Banker Meetings: Patient declined    Marital Status: Married  Catering Manager Violence: Not At Risk (05/27/2024)   Humiliation, Afraid, Rape, and Kick questionnaire    Fear of Current or Ex-Partner: No    Emotionally Abused: No    Physically Abused: No    Sexually Abused: No    Review of Systems:  All other review of systems negative except as mentioned in the HPI.  Physical Exam: Vital signs in last 24 hours: BP 117/76   Pulse 87   Temp (!) 97.4 F (36.3 C)   Ht 5' 6 (1.676 m)   Wt 143 lb (64.9 kg)   SpO2 98%   BMI 23.08 kg/m  General:   Alert, NAD Lungs:  Clear .   Heart:  Regular rate and rhythm Abdomen:  Soft, nontender and nondistended. Neuro/Psych:  Alert and cooperative. Normal mood and affect. A and O x 3  Reviewed labs, radiology imaging, old records and pertinent past GI work up  Patient is appropriate for planned procedure(s) and anesthesia in an ambulatory setting   K. Veena Jeannelle Wiens , MD [phone]

## 2024-08-09 NOTE — Patient Instructions (Signed)
 Thank you for letting us take care of your healthcare needs today. Please see handouts given to you on Polyps, Diverticulosis and Hemorrhoids.     YOU HAD AN ENDOSCOPIC PROCEDURE TODAY AT THE [facility]:   Refer to the procedure report that was given to you for any specific questions about what was found during the examination.  If the procedure report does not answer your questions, please call your gastroenterologist to clarify.  If you requested that your care partner not be given the details of your procedure findings, then the procedure report has been included in a sealed envelope for you to review at your convenience later.  YOU SHOULD EXPECT: Some feelings of bloating in the abdomen. Passage of more gas than usual.  Walking can help get rid of the air that was put into your GI tract during the procedure and reduce the bloating. If you had a lower endoscopy (such as a colonoscopy or flexible sigmoidoscopy) you may notice spotting of blood in your stool or on the toilet paper. If you underwent a bowel prep for your procedure, you may not have a normal bowel movement for a few days.  Please Note:  You might notice some irritation and congestion in your nose or some drainage.  This is from the oxygen used during your procedure.  There is no need for concern and it should clear up in a day or so.  SYMPTOMS TO REPORT IMMEDIATELY:  Following lower endoscopy (colonoscopy or flexible sigmoidoscopy):  Excessive amounts of blood in the stool  Significant tenderness or worsening of abdominal pains  Swelling of the abdomen that is new, acute  Fever of 100F or higher   For urgent or emergent issues, a gastroenterologist can be reached at any hour by calling [phone]. Do not use MyChart messaging for urgent concerns.    DIET:  We do recommend a small meal at first, but then you may proceed to your regular diet.  Drink plenty of fluids but you should avoid alcoholic beverages  for 24 hours.  ACTIVITY:  You should plan to take it easy for the rest of today and you should NOT DRIVE or use heavy machinery until tomorrow (because of the sedation medicines used during the test).    FOLLOW UP: Our staff will call the number listed on your records the next business day following your procedure.  We will call around 7:15- 8:00 am to check on you and address any questions or concerns that you may have regarding the information given to you following your procedure. If we do not reach you, we will leave a message.     If any biopsies were taken you will be contacted by phone or by letter within the next 1-3 weeks.  Please call us at [phone] if you have not heard about the biopsies in 3 weeks.    SIGNATURES/CONFIDENTIALITY: You and/or your care partner have signed paperwork which will be entered into your electronic medical record.  These signatures attest to the fact that that the information above on your After Visit Summary has been reviewed and is understood.  Full responsibility of the confidentiality of this discharge information lies with you and/or your care-partner.

## 2024-08-10 ENCOUNTER — Telehealth: Payer: Self-pay

## 2024-08-10 NOTE — Telephone Encounter (Signed)
  Follow up Call-     08/09/2024   12:33 PM 08/11/2023   12:41 PM  Call back number  Post procedure Call Back phone  # [phone]  Permission to leave phone message Yes Yes     Patient questions:  Do you have a fever, pain , or abdominal swelling? No. Pain Score  0 *  Have you tolerated food without any problems? Yes.    Have you been able to return to your normal activities? Yes.    Do you have any questions about your discharge instructions: Diet   No. Medications  No. Follow up visit  No.  Do you have questions or concerns about your Care? No.  Actions: * If pain score is 4 or above: No action needed, pain <4.

## 2024-08-13 LAB — SURGICAL PATHOLOGY

## 2024-08-16 ENCOUNTER — Ambulatory Visit: Payer: Self-pay | Admitting: Gastroenterology

## 2024-10-22 NOTE — Addendum Note (Signed)
 Addended by: DAYNE SHERRY RAMAN on: 10/22/2024 12:17 PM   Modules accepted: Orders

## 2025-06-16 ENCOUNTER — Ambulatory Visit: Payer: Self-pay | Admitting: Family Medicine
